# Patient Record
Sex: Female | Born: 1935
Health system: Southern US, Community
[De-identification: ages and names within clinical notes are randomized; demographics above are authoritative.]

## PROBLEM LIST (undated history)

## (undated) DIAGNOSIS — N312 Flaccid neuropathic bladder, not elsewhere classified: Secondary | ICD-10-CM

## (undated) DIAGNOSIS — I10 Essential (primary) hypertension: Secondary | ICD-10-CM

## (undated) DIAGNOSIS — C801 Malignant (primary) neoplasm, unspecified: Secondary | ICD-10-CM

## (undated) DIAGNOSIS — R002 Palpitations: Secondary | ICD-10-CM

## (undated) DIAGNOSIS — Z9889 Other specified postprocedural states: Secondary | ICD-10-CM

## (undated) DIAGNOSIS — M1990 Unspecified osteoarthritis, unspecified site: Secondary | ICD-10-CM

## (undated) DIAGNOSIS — Z860101 Personal history of adenomatous and serrated colon polyps: Secondary | ICD-10-CM

## (undated) DIAGNOSIS — Z8709 Personal history of other diseases of the respiratory system: Secondary | ICD-10-CM

## (undated) DIAGNOSIS — Z86010 Personal history of colonic polyps: Secondary | ICD-10-CM

## (undated) DIAGNOSIS — Z85828 Personal history of other malignant neoplasm of skin: Secondary | ICD-10-CM

## (undated) DIAGNOSIS — R0600 Dyspnea, unspecified: Secondary | ICD-10-CM

## (undated) DIAGNOSIS — IMO0001 Reserved for inherently not codable concepts without codable children: Secondary | ICD-10-CM

## (undated) DIAGNOSIS — Z960 Presence of urogenital implants: Secondary | ICD-10-CM

## (undated) DIAGNOSIS — Z86018 Personal history of other benign neoplasm: Secondary | ICD-10-CM

## (undated) DIAGNOSIS — L659 Nonscarring hair loss, unspecified: Secondary | ICD-10-CM

## (undated) DIAGNOSIS — E782 Mixed hyperlipidemia: Secondary | ICD-10-CM

## (undated) DIAGNOSIS — K644 Residual hemorrhoidal skin tags: Secondary | ICD-10-CM

## (undated) DIAGNOSIS — R339 Retention of urine, unspecified: Secondary | ICD-10-CM

## (undated) DIAGNOSIS — Z87442 Personal history of urinary calculi: Secondary | ICD-10-CM

## (undated) DIAGNOSIS — Z978 Presence of other specified devices: Secondary | ICD-10-CM

## (undated) DIAGNOSIS — Z853 Personal history of malignant neoplasm of breast: Secondary | ICD-10-CM

## (undated) HISTORY — DX: Flaccid neuropathic bladder, not elsewhere classified: N31.2

## (undated) HISTORY — DX: Reserved for inherently not codable concepts without codable children: IMO0001

## (undated) HISTORY — PX: APPENDECTOMY: SHX54

## (undated) HISTORY — PX: COLONOSCOPY: SHX174

## (undated) HISTORY — DX: Palpitations: R00.2

## (undated) HISTORY — DX: Residual hemorrhoidal skin tags: K64.4

## (undated) HISTORY — DX: Malignant (primary) neoplasm, unspecified: C80.1

## (undated) HISTORY — PX: TONSILLECTOMY: SHX5217

## (undated) HISTORY — DX: Mixed hyperlipidemia: E78.2

## (undated) HISTORY — PX: CATARACT EXTRACTION W/ INTRAOCULAR LENS  IMPLANT, BILATERAL: SHX1307

---

## 2005-07-20 ENCOUNTER — Ambulatory Visit (HOSPITAL_COMMUNITY): Admission: RE | Admit: 2005-07-20 | Discharge: 2005-07-20 | Payer: Self-pay | Admitting: Gastroenterology

## 2005-07-20 ENCOUNTER — Encounter (INDEPENDENT_AMBULATORY_CARE_PROVIDER_SITE_OTHER): Payer: Self-pay | Admitting: Specialist

## 2005-07-25 ENCOUNTER — Encounter: Admission: RE | Admit: 2005-07-25 | Discharge: 2005-07-25 | Payer: Self-pay | Admitting: Gastroenterology

## 2005-08-18 HISTORY — PX: OTHER SURGICAL HISTORY: SHX169

## 2005-12-29 ENCOUNTER — Encounter: Admission: RE | Admit: 2005-12-29 | Discharge: 2005-12-29 | Payer: Self-pay | Admitting: Family Medicine

## 2005-12-29 ENCOUNTER — Encounter (INDEPENDENT_AMBULATORY_CARE_PROVIDER_SITE_OTHER): Payer: Self-pay | Admitting: Diagnostic Radiology

## 2005-12-29 ENCOUNTER — Encounter (INDEPENDENT_AMBULATORY_CARE_PROVIDER_SITE_OTHER): Payer: Self-pay | Admitting: *Deleted

## 2006-01-09 ENCOUNTER — Encounter: Admission: RE | Admit: 2006-01-09 | Discharge: 2006-01-09 | Payer: Self-pay | Admitting: Surgery

## 2006-01-22 ENCOUNTER — Encounter: Admission: RE | Admit: 2006-01-22 | Discharge: 2006-01-22 | Payer: Self-pay | Admitting: General Surgery

## 2006-01-23 ENCOUNTER — Ambulatory Visit (HOSPITAL_BASED_OUTPATIENT_CLINIC_OR_DEPARTMENT_OTHER): Admission: RE | Admit: 2006-01-23 | Discharge: 2006-01-23 | Payer: Self-pay | Admitting: General Surgery

## 2006-01-23 ENCOUNTER — Encounter (INDEPENDENT_AMBULATORY_CARE_PROVIDER_SITE_OTHER): Payer: Self-pay | Admitting: Specialist

## 2006-01-23 HISTORY — PX: BREAST LUMPECTOMY WITH NEEDLE LOCALIZATION AND AXILLARY SENTINEL LYMPH NODE BX: SHX5760

## 2006-01-24 ENCOUNTER — Ambulatory Visit: Payer: Self-pay | Admitting: Oncology

## 2006-02-14 LAB — COMPREHENSIVE METABOLIC PANEL
ALT: 15 U/L (ref 0–35)
AST: 14 U/L (ref 0–37)
Alkaline Phosphatase: 77 U/L (ref 39–117)
Creatinine, Ser: 0.83 mg/dL (ref 0.40–1.20)
Total Bilirubin: 0.7 mg/dL (ref 0.3–1.2)

## 2006-02-14 LAB — CBC WITH DIFFERENTIAL/PLATELET
BASO%: 0.4 % (ref 0.0–2.0)
EOS%: 3.1 % (ref 0.0–7.0)
HCT: 37.9 % (ref 34.8–46.6)
LYMPH%: 25.5 % (ref 14.0–48.0)
MCH: 29.3 pg (ref 26.0–34.0)
MCHC: 34.3 g/dL (ref 32.0–36.0)
NEUT%: 61.5 % (ref 39.6–76.8)
Platelets: 247 10*3/uL (ref 145–400)

## 2006-02-19 ENCOUNTER — Ambulatory Visit (HOSPITAL_COMMUNITY): Admission: RE | Admit: 2006-02-19 | Discharge: 2006-02-19 | Payer: Self-pay | Admitting: Oncology

## 2006-02-28 ENCOUNTER — Ambulatory Visit (HOSPITAL_COMMUNITY): Admission: RE | Admit: 2006-02-28 | Discharge: 2006-03-01 | Payer: Self-pay | Admitting: General Surgery

## 2006-02-28 ENCOUNTER — Encounter (INDEPENDENT_AMBULATORY_CARE_PROVIDER_SITE_OTHER): Payer: Self-pay | Admitting: Specialist

## 2006-02-28 HISTORY — PX: MASTECTOMY: SHX3

## 2006-03-08 ENCOUNTER — Ambulatory Visit (HOSPITAL_COMMUNITY): Admission: RE | Admit: 2006-03-08 | Discharge: 2006-03-08 | Payer: Self-pay | Admitting: Oncology

## 2006-03-19 ENCOUNTER — Ambulatory Visit: Admission: RE | Admit: 2006-03-19 | Discharge: 2006-06-01 | Payer: Self-pay | Admitting: Radiation Oncology

## 2006-03-26 ENCOUNTER — Ambulatory Visit: Payer: Self-pay | Admitting: Oncology

## 2006-07-06 ENCOUNTER — Ambulatory Visit: Payer: Self-pay | Admitting: Oncology

## 2006-07-11 LAB — CBC WITH DIFFERENTIAL/PLATELET
BASO%: 0.5 % (ref 0.0–2.0)
Basophils Absolute: 0 10*3/uL (ref 0.0–0.1)
EOS%: 3.7 % (ref 0.0–7.0)
HGB: 13.3 g/dL (ref 11.6–15.9)
MCH: 28.9 pg (ref 26.0–34.0)
MONO#: 0.4 10*3/uL (ref 0.1–0.9)
RDW: 14.9 % — ABNORMAL HIGH (ref 11.3–14.5)
WBC: 6.3 10*3/uL (ref 3.9–10.0)
lymph#: 1.7 10*3/uL (ref 0.9–3.3)

## 2006-07-14 LAB — CANCER ANTIGEN 27.29: CA 27.29: 25 U/mL (ref 0–39)

## 2006-07-14 LAB — COMPREHENSIVE METABOLIC PANEL
ALT: 17 U/L (ref 0–35)
AST: 15 U/L (ref 0–37)
Albumin: 4.2 g/dL (ref 3.5–5.2)
BUN: 14 mg/dL (ref 6–23)
CO2: 30 mEq/L (ref 19–32)
Calcium: 9.3 mg/dL (ref 8.4–10.5)
Chloride: 105 mEq/L (ref 96–112)
Potassium: 4.2 mEq/L (ref 3.5–5.3)

## 2006-09-05 ENCOUNTER — Ambulatory Visit: Payer: Self-pay | Admitting: Oncology

## 2006-09-10 LAB — CBC WITH DIFFERENTIAL/PLATELET
BASO%: 0.6 % (ref 0.0–2.0)
EOS%: 3.9 % (ref 0.0–7.0)
HCT: 36.1 % (ref 34.8–46.6)
MCH: 29.5 pg (ref 26.0–34.0)
MCHC: 35 g/dL (ref 32.0–36.0)
MCV: 84.2 fL (ref 81.0–101.0)
MONO%: 9.9 % (ref 0.0–13.0)
NEUT%: 55.5 % (ref 39.6–76.8)
RDW: 15.8 % — ABNORMAL HIGH (ref 11.3–14.5)
lymph#: 1.8 10*3/uL (ref 0.9–3.3)

## 2006-09-10 LAB — COMPREHENSIVE METABOLIC PANEL
ALT: 15 U/L (ref 0–35)
AST: 15 U/L (ref 0–37)
Alkaline Phosphatase: 75 U/L (ref 39–117)
BUN: 15 mg/dL (ref 6–23)
Calcium: 9.6 mg/dL (ref 8.4–10.5)
Chloride: 104 mEq/L (ref 96–112)
Creatinine, Ser: 0.79 mg/dL (ref 0.40–1.20)
Total Bilirubin: 0.5 mg/dL (ref 0.3–1.2)

## 2007-04-22 ENCOUNTER — Encounter: Admission: RE | Admit: 2007-04-22 | Discharge: 2007-04-22 | Payer: Self-pay | Admitting: Orthopedic Surgery

## 2007-04-23 ENCOUNTER — Ambulatory Visit: Payer: Self-pay | Admitting: Oncology

## 2007-04-24 ENCOUNTER — Ambulatory Visit (HOSPITAL_BASED_OUTPATIENT_CLINIC_OR_DEPARTMENT_OTHER): Admission: RE | Admit: 2007-04-24 | Discharge: 2007-04-24 | Payer: Self-pay | Admitting: Orthopedic Surgery

## 2007-04-24 HISTORY — PX: CARPAL TUNNEL RELEASE: SHX101

## 2007-04-25 LAB — COMPREHENSIVE METABOLIC PANEL
ALT: 15 U/L (ref 0–35)
AST: 18 U/L (ref 0–37)
Albumin: 4.5 g/dL (ref 3.5–5.2)
Alkaline Phosphatase: 69 U/L (ref 39–117)
BUN: 24 mg/dL — ABNORMAL HIGH (ref 6–23)
Potassium: 4.1 mEq/L (ref 3.5–5.3)
Sodium: 143 mEq/L (ref 135–145)

## 2007-04-25 LAB — CBC WITH DIFFERENTIAL/PLATELET
BASO%: 0.2 % (ref 0.0–2.0)
Basophils Absolute: 0 10*3/uL (ref 0.0–0.1)
EOS%: 0.1 % (ref 0.0–7.0)
MCH: 29.6 pg (ref 26.0–34.0)
MCHC: 35.3 g/dL (ref 32.0–36.0)
MCV: 84 fL (ref 81.0–101.0)
MONO%: 8.3 % (ref 0.0–13.0)
RBC: 4.34 10*6/uL (ref 3.70–5.32)
RDW: 14.9 % — ABNORMAL HIGH (ref 11.3–14.5)

## 2007-04-25 LAB — CANCER ANTIGEN 27.29: CA 27.29: 19 U/mL (ref 0–39)

## 2007-07-22 ENCOUNTER — Other Ambulatory Visit: Admission: RE | Admit: 2007-07-22 | Discharge: 2007-07-22 | Payer: Self-pay | Admitting: Family Medicine

## 2007-08-07 ENCOUNTER — Ambulatory Visit (HOSPITAL_BASED_OUTPATIENT_CLINIC_OR_DEPARTMENT_OTHER): Admission: RE | Admit: 2007-08-07 | Discharge: 2007-08-07 | Payer: Self-pay | Admitting: Orthopedic Surgery

## 2007-08-07 HISTORY — PX: OTHER SURGICAL HISTORY: SHX169

## 2007-11-11 ENCOUNTER — Ambulatory Visit: Payer: Self-pay | Admitting: Oncology

## 2007-11-13 LAB — CBC WITH DIFFERENTIAL/PLATELET
Basophils Absolute: 0 10*3/uL (ref 0.0–0.1)
Eosinophils Absolute: 0.3 10*3/uL (ref 0.0–0.5)
HGB: 12.1 g/dL (ref 11.6–15.9)
MCV: 85.6 fL (ref 81.0–101.0)
MONO#: 0.5 10*3/uL (ref 0.1–0.9)
NEUT#: 3.7 10*3/uL (ref 1.5–6.5)
Platelets: 277 10*3/uL (ref 145–400)
RBC: 4.19 10*6/uL (ref 3.70–5.32)
RDW: 14.6 % — ABNORMAL HIGH (ref 11.3–14.5)
WBC: 6.1 10*3/uL (ref 3.9–10.0)

## 2007-11-14 LAB — CANCER ANTIGEN 27.29: CA 27.29: 26 U/mL (ref 0–39)

## 2007-11-14 LAB — COMPREHENSIVE METABOLIC PANEL
ALT: 14 U/L (ref 0–35)
Albumin: 4.1 g/dL (ref 3.5–5.2)
CO2: 25 mEq/L (ref 19–32)
Calcium: 9.8 mg/dL (ref 8.4–10.5)
Chloride: 104 mEq/L (ref 96–112)
Glucose, Bld: 100 mg/dL — ABNORMAL HIGH (ref 70–99)
Sodium: 139 mEq/L (ref 135–145)
Total Protein: 7 g/dL (ref 6.0–8.3)

## 2007-11-14 LAB — LACTATE DEHYDROGENASE: LDH: 164 U/L (ref 94–250)

## 2007-11-14 LAB — VITAMIN D 25 HYDROXY (VIT D DEFICIENCY, FRACTURES): Vit D, 25-Hydroxy: 32 ng/mL (ref 30–89)

## 2008-05-20 LAB — HM COLONOSCOPY: HM Colonoscopy: NORMAL

## 2008-05-22 ENCOUNTER — Ambulatory Visit: Payer: Self-pay | Admitting: Oncology

## 2008-05-26 LAB — CBC WITH DIFFERENTIAL/PLATELET
BASO%: 0.4 % (ref 0.0–2.0)
EOS%: 3.9 % (ref 0.0–7.0)
HCT: 38.4 % (ref 34.8–46.6)
LYMPH%: 29.3 % (ref 14.0–49.7)
MCH: 29.1 pg (ref 25.1–34.0)
MCHC: 33.6 g/dL (ref 31.5–36.0)
MCV: 86.7 fL (ref 79.5–101.0)
MONO%: 7.7 % (ref 0.0–14.0)
NEUT%: 58.7 % (ref 38.4–76.8)
Platelets: 215 10*3/uL (ref 145–400)
RBC: 4.43 10*6/uL (ref 3.70–5.45)
WBC: 6.1 10*3/uL (ref 3.9–10.3)

## 2008-05-26 LAB — COMPREHENSIVE METABOLIC PANEL
ALT: 16 U/L (ref 0–35)
AST: 19 U/L (ref 0–37)
BUN: 10 mg/dL (ref 6–23)
Calcium: 9.9 mg/dL (ref 8.4–10.5)
Chloride: 103 mEq/L (ref 96–112)
Creatinine, Ser: 0.75 mg/dL (ref 0.40–1.20)
Total Bilirubin: 0.9 mg/dL (ref 0.3–1.2)

## 2008-05-26 LAB — LACTATE DEHYDROGENASE: LDH: 167 U/L (ref 94–250)

## 2008-12-11 ENCOUNTER — Emergency Department (HOSPITAL_COMMUNITY): Admission: EM | Admit: 2008-12-11 | Discharge: 2008-12-11 | Payer: Self-pay | Admitting: Emergency Medicine

## 2008-12-23 ENCOUNTER — Ambulatory Visit: Payer: Self-pay | Admitting: Oncology

## 2008-12-25 LAB — CBC WITH DIFFERENTIAL/PLATELET
BASO%: 0.6 % (ref 0.0–2.0)
HCT: 36.9 % (ref 34.8–46.6)
HGB: 12.5 g/dL (ref 11.6–15.9)
MCHC: 33.8 g/dL (ref 31.5–36.0)
MONO#: 0.4 10*3/uL (ref 0.1–0.9)
NEUT%: 60.2 % (ref 38.4–76.8)
RDW: 13.9 % (ref 11.2–14.5)
WBC: 6.3 10*3/uL (ref 3.9–10.3)
lymph#: 1.8 10*3/uL (ref 0.9–3.3)

## 2008-12-26 LAB — COMPREHENSIVE METABOLIC PANEL
ALT: 14 U/L (ref 0–35)
AST: 16 U/L (ref 0–37)
Albumin: 4.3 g/dL (ref 3.5–5.2)
CO2: 26 mEq/L (ref 19–32)
Calcium: 9.7 mg/dL (ref 8.4–10.5)
Chloride: 106 mEq/L (ref 96–112)
Creatinine, Ser: 0.78 mg/dL (ref 0.40–1.20)
Potassium: 3.9 mEq/L (ref 3.5–5.3)
Total Protein: 7 g/dL (ref 6.0–8.3)

## 2008-12-26 LAB — LACTATE DEHYDROGENASE: LDH: 187 U/L (ref 94–250)

## 2008-12-28 LAB — HM MAMMOGRAPHY

## 2009-01-31 ENCOUNTER — Emergency Department (HOSPITAL_COMMUNITY): Admission: EM | Admit: 2009-01-31 | Discharge: 2009-01-31 | Payer: Self-pay | Admitting: Emergency Medicine

## 2009-02-24 ENCOUNTER — Encounter: Admission: RE | Admit: 2009-02-24 | Discharge: 2009-02-24 | Payer: Self-pay | Admitting: Otolaryngology

## 2009-06-23 ENCOUNTER — Ambulatory Visit: Payer: Self-pay | Admitting: Oncology

## 2009-06-25 LAB — COMPREHENSIVE METABOLIC PANEL
AST: 14 U/L (ref 0–37)
Alkaline Phosphatase: 85 U/L (ref 39–117)
BUN: 12 mg/dL (ref 6–23)
Calcium: 9.2 mg/dL (ref 8.4–10.5)
Creatinine, Ser: 0.83 mg/dL (ref 0.40–1.20)

## 2009-06-25 LAB — CBC WITH DIFFERENTIAL/PLATELET
Basophils Absolute: 0.1 10*3/uL (ref 0.0–0.1)
EOS%: 2 % (ref 0.0–7.0)
HCT: 37.9 % (ref 34.8–46.6)
HGB: 12.9 g/dL (ref 11.6–15.9)
MCH: 29.4 pg (ref 25.1–34.0)
MCV: 86.4 fL (ref 79.5–101.0)
MONO%: 9.4 % (ref 0.0–14.0)
NEUT%: 73.3 % (ref 38.4–76.8)

## 2009-09-07 ENCOUNTER — Ambulatory Visit: Payer: Self-pay | Admitting: Oncology

## 2010-04-27 ENCOUNTER — Ambulatory Visit (INDEPENDENT_AMBULATORY_CARE_PROVIDER_SITE_OTHER): Payer: Medicare Other | Admitting: Physician Assistant

## 2010-04-27 DIAGNOSIS — R000 Tachycardia, unspecified: Secondary | ICD-10-CM

## 2010-04-27 DIAGNOSIS — E7800 Pure hypercholesterolemia, unspecified: Secondary | ICD-10-CM

## 2010-06-22 LAB — URINE MICROSCOPIC-ADD ON

## 2010-06-22 LAB — URINALYSIS, ROUTINE W REFLEX MICROSCOPIC
Glucose, UA: NEGATIVE mg/dL
Leukocytes, UA: NEGATIVE
Nitrite: NEGATIVE
Protein, ur: 100 mg/dL — AB

## 2010-08-02 NOTE — Op Note (Signed)
Mary [name], Mary [name]                 ACCOUNT NO.:  [id number]   MEDICAL RECORD NO.:  [id number]          PATIENT TYPE:  AMB   LOCATION:  DSC                          FACILITY:  [facility]   PHYSICIAN:  Artist Pais. Weingold, M.D.DATE OF BIRTH:  [birth year]   DATE OF PROCEDURE:  08/07/2007  DATE OF DISCHARGE:                               OPERATIVE REPORT   PREOPERATIVE DIAGNOSIS:  Chronic left index finger and left small finger  stenosing tenosynovitis.   POSTOPERATIVE DIAGNOSIS:  Chronic left index finger and left small  finger stenosing tenosynovitis.   PROCEDURE:  Release A1 pulleys, left index and left small.   SURGEON:  Artist Pais. Mina Marble, MD   ASSISTANT:  None.   ANESTHESIA:  General.   TOURNIQUET TIME:  12 minutes.   No complications or drains.   OPERATIVE REPORT:  The patient was taken to operating suite.  After  induction of adequate general anesthesia, the left upper thigh was  prepped and draped in sterile fashion.  An Esmarch was used to  exsanguinate the limb.  Tourniquet was then inflated to 250 mmHg at this  point in time.  Transverse incisions were made in the A1 pulley areas of  the index and small finger, left hand.  The skin was incised.  Neurovascular bundles was identified and retracted.  A1 pulley was  split.  FDS and FDP tendons were lysed of all adhesions.  The wounds  were irrigated and these were closed with 4-0 nylon with two horizontal  mattress sutures.  Xeroform, 4x4s, and compressive wrap was applied.  The patient tolerated the procedure well, went to recovery room in  stable fashion.      Artist Pais Mina Marble, M.D.  Electronically Signed     MAW/MEDQ  D:  08/07/2007  T:  08/07/2007  Job:  [id number]

## 2010-08-02 NOTE — Op Note (Signed)
NAMECHAVONNE, SFORZA                 ACCOUNT NO.:  [id number]   MEDICAL RECORD NO.:  [id number]          PATIENT TYPE:  AMB   LOCATION:  DSC                          FACILITY:  [facility]   PHYSICIAN:  Artist Pais. Weingold, M.D.DATE OF BIRTH:  08/29/1926   DATE OF PROCEDURE:  04/24/2007  DATE OF DISCHARGE:                               OPERATIVE REPORT   PREOPERATIVE DIAGNOSES:  1. Chronic right carpal tunnel syndrome.  2. Chronic right index and small finger stenosing tenosynovitis.   POSTOPERATIVE DIAGNOSES:  1. Chronic right carpal tunnel syndrome.  2. Chronic right index and small finger stenosing tenosynovitis.   OPERATION PERFORMED:  1. Right carpal tunnel release.  2. Right index finger A-1 pulley release.  3. Right small finger A-1 pulley release through three separate      incisions.   SURGEON:  Artist Pais. Mina Marble, M.D.   ASSISTANT:  None.   ANESTHESIA:  General.   TOURNIQUET TIME:  Twenty-six minutes.   COMPLICATIONS:  There were no complications.   DRAINS:  No drains.   DESCRIPTION OF THE OPERATION:  The patient brought to the operating  suite.  After the induction of adequate general anesthesia the right  upper extremity was prepped and draped in the usual sterile fashion.  An  Esmarch was used to exsanguinate the limb.  The tourniquet was then  inflated to 275 mmHg.  At this point in time an incision was made in the  palmar aspect of right hand in line with long finger metacarpal starting  at Kaplan's cardinal line.  The skin was incised 2 cm.  The palmar  fascia was identified and split.  The distal edge of the transverse  carpal ligament was identified and split with a 15 blade.  The median  nerve was identified and protected with Therapist, nutritional.  The remaining  aspects of the transverse carpal ligament were divided under direct  vision using curved blunt scissors.  The canal was inspected.  There  were no osseous lesions or ganglions present.  It was irrigated  and  loosely closed with a 3-0 Prolene subcuticular stitch.   A second incision was made at the A-1 pulley of the index finger  transversely.  The skin was incised.  The neurovascular was identified  and retracted.  The A-1 pulley was split.  The FDS and FDP tendons were  lysed of all adhesions.  The wound was irrigated and loosely closed with  a 3-0 Prolene subcuticular stitch.   A third incision was made at the A-1 pulley area of the small finger,  right hand.  The skin was incised transversely.  The neurovascular  bundle was identified and retracted.  The A-1 pulley was split.  The FDS  and FDP tendons were lysed of all adhesions.  The wound  was irrigated  and loosely closed with a 3-0 Prolene subcuticular stitch.  Steri-  Strips, four-by-fours,  fluffs and a compressive dressing were applied.   The patient tolerated all three procedures well and went to recovery in  a stable fashion.      Artist Pais  Mina Marble, M.D.  Electronically Signed     MAW/MEDQ  D:  04/24/2007  T:  04/25/2007  Job:  [id number]

## 2010-08-09 ENCOUNTER — Encounter (INDEPENDENT_AMBULATORY_CARE_PROVIDER_SITE_OTHER): Payer: Self-pay | Admitting: Surgery

## 2010-09-02 ENCOUNTER — Other Ambulatory Visit: Payer: Self-pay | Admitting: Oncology

## 2010-09-02 ENCOUNTER — Encounter (HOSPITAL_BASED_OUTPATIENT_CLINIC_OR_DEPARTMENT_OTHER): Payer: Medicare Other | Admitting: Oncology

## 2010-09-02 DIAGNOSIS — M810 Age-related osteoporosis without current pathological fracture: Secondary | ICD-10-CM

## 2010-09-02 DIAGNOSIS — C50419 Malignant neoplasm of upper-outer quadrant of unspecified female breast: Secondary | ICD-10-CM

## 2010-09-02 DIAGNOSIS — Z170 Estrogen receptor positive status [ER+]: Secondary | ICD-10-CM

## 2010-09-02 LAB — COMPREHENSIVE METABOLIC PANEL
ALT: 24 U/L (ref 0–35)
BUN: 15 mg/dL (ref 6–23)
CO2: 29 mEq/L (ref 19–32)
Calcium: 9.7 mg/dL (ref 8.4–10.5)
Chloride: 102 mEq/L (ref 96–112)
Creatinine, Ser: 0.86 mg/dL (ref 0.50–1.10)

## 2010-09-02 LAB — CBC WITH DIFFERENTIAL/PLATELET
BASO%: 0.4 % (ref 0.0–2.0)
Eosinophils Absolute: 0.3 10*3/uL (ref 0.0–0.5)
HCT: 38.5 % (ref 34.8–46.6)
LYMPH%: 26.2 % (ref 14.0–49.7)
MONO#: 0.4 10*3/uL (ref 0.1–0.9)
NEUT#: 4.2 10*3/uL (ref 1.5–6.5)
NEUT%: 63.4 % (ref 38.4–76.8)
Platelets: 254 10*3/uL (ref 145–400)
WBC: 6.7 10*3/uL (ref 3.9–10.3)
lymph#: 1.8 10*3/uL (ref 0.9–3.3)

## 2010-09-02 LAB — LACTATE DEHYDROGENASE: LDH: 264 U/L — ABNORMAL HIGH (ref 94–250)

## 2010-09-03 ENCOUNTER — Emergency Department (HOSPITAL_COMMUNITY)
Admission: EM | Admit: 2010-09-03 | Discharge: 2010-09-03 | Disposition: A | Discharge status: Home / Self Care | Payer: Medicare Other | Attending: Emergency Medicine | Admitting: Emergency Medicine

## 2010-09-03 DIAGNOSIS — Z79899 Other long term (current) drug therapy: Secondary | ICD-10-CM | POA: Insufficient documentation

## 2010-09-03 DIAGNOSIS — R339 Retention of urine, unspecified: Secondary | ICD-10-CM | POA: Insufficient documentation

## 2010-09-03 DIAGNOSIS — R109 Unspecified abdominal pain: Secondary | ICD-10-CM | POA: Insufficient documentation

## 2010-09-03 DIAGNOSIS — R1909 Other intra-abdominal and pelvic swelling, mass and lump: Secondary | ICD-10-CM | POA: Insufficient documentation

## 2010-09-03 LAB — URINALYSIS, ROUTINE W REFLEX MICROSCOPIC
Glucose, UA: NEGATIVE mg/dL
Specific Gravity, Urine: 1.007 (ref 1.005–1.030)
pH: 7.5 (ref 5.0–8.0)

## 2010-09-03 LAB — URINE MICROSCOPIC-ADD ON

## 2010-09-09 ENCOUNTER — Encounter (HOSPITAL_BASED_OUTPATIENT_CLINIC_OR_DEPARTMENT_OTHER): Payer: Medicare Other | Admitting: Oncology

## 2010-09-09 DIAGNOSIS — C50419 Malignant neoplasm of upper-outer quadrant of unspecified female breast: Secondary | ICD-10-CM

## 2010-09-09 DIAGNOSIS — M810 Age-related osteoporosis without current pathological fracture: Secondary | ICD-10-CM

## 2010-09-09 DIAGNOSIS — Z170 Estrogen receptor positive status [ER+]: Secondary | ICD-10-CM

## 2010-10-04 ENCOUNTER — Other Ambulatory Visit: Payer: Self-pay | Admitting: Family Medicine

## 2010-10-05 ENCOUNTER — Other Ambulatory Visit: Payer: Self-pay | Admitting: *Deleted

## 2010-10-05 DIAGNOSIS — E785 Hyperlipidemia, unspecified: Secondary | ICD-10-CM

## 2010-10-12 ENCOUNTER — Encounter: Payer: Self-pay | Admitting: Family Medicine

## 2010-10-17 ENCOUNTER — Other Ambulatory Visit: Payer: Medicare Other

## 2010-10-17 DIAGNOSIS — E785 Hyperlipidemia, unspecified: Secondary | ICD-10-CM

## 2010-10-17 LAB — COMPREHENSIVE METABOLIC PANEL
ALT: 18 U/L (ref 0–35)
Albumin: 4.3 g/dL (ref 3.5–5.2)
BUN: 12 mg/dL (ref 6–23)
CO2: 27 mEq/L (ref 19–32)
Calcium: 9.9 mg/dL (ref 8.4–10.5)
Chloride: 104 mEq/L (ref 96–112)
Creat: 0.77 mg/dL (ref 0.50–1.10)
Potassium: 4.2 mEq/L (ref 3.5–5.3)

## 2010-10-17 LAB — LIPID PANEL
Cholesterol: 205 mg/dL — ABNORMAL HIGH (ref 0–200)
Triglycerides: 173 mg/dL — ABNORMAL HIGH (ref <150)

## 2010-10-19 ENCOUNTER — Encounter: Payer: Self-pay | Admitting: Family Medicine

## 2010-10-19 ENCOUNTER — Ambulatory Visit (INDEPENDENT_AMBULATORY_CARE_PROVIDER_SITE_OTHER): Payer: Medicare Other | Admitting: Family Medicine

## 2010-10-19 DIAGNOSIS — E782 Mixed hyperlipidemia: Secondary | ICD-10-CM | POA: Insufficient documentation

## 2010-10-19 DIAGNOSIS — M25569 Pain in unspecified knee: Secondary | ICD-10-CM

## 2010-10-19 DIAGNOSIS — N312 Flaccid neuropathic bladder, not elsewhere classified: Secondary | ICD-10-CM

## 2010-10-19 DIAGNOSIS — R002 Palpitations: Secondary | ICD-10-CM | POA: Insufficient documentation

## 2010-10-19 DIAGNOSIS — M25559 Pain in unspecified hip: Secondary | ICD-10-CM

## 2010-10-19 DIAGNOSIS — M25552 Pain in left hip: Secondary | ICD-10-CM

## 2010-10-19 DIAGNOSIS — M25561 Pain in right knee: Secondary | ICD-10-CM

## 2010-10-19 MED ORDER — ATORVASTATIN CALCIUM 40 MG PO TABS: 40.0000 mg | ORAL_TABLET | Freq: Every day | ORAL | Status: DC

## 2010-10-19 MED ORDER — VERAPAMIL HCL 240 MG PO TBCR: EXTENDED_RELEASE_TABLET | ORAL | Status: DC

## 2010-10-19 MED ORDER — LANOXIN 125 MCG PO TABS: 125.0000 ug | ORAL_TABLET | Freq: Every day | ORAL | Status: DC

## 2010-10-19 NOTE — Progress Notes (Signed)
Patient presents for f/u recent lipids. She follows a low cholesterol diet.  Can't tolerate taking fish oil (trouble swallowing the capsules, doesn't like taste). She has no complaints, except for some recurrence of L hip and R knee pain.  She  Saw Dr. Despina Hick in November for pain L hip and R knee.  Went back and had cortisone injections in R knee and L hip (doesn't recall if in joint or bursa) in March 2012.  The injections helped a lot, but pain has recurred.  Using Aleve with good results, as does Advil.  Doesn't use a medication daily.  She doesn't check BP's elsewhere.  BP at Dr. Patsi Sears yesterday was very high, better on repeat.  She denies a h/o HTN, having been placed on CCB for palpitations years ago.  She had an episode of urinary retention.  Was started on low dose urecholine, and seems to be doing well.  Saw Dr. Patsi Sears for f/u yesterday.  She tells me that she stopped the Fosamax 3 months ago.  She had been on it for many years.  She is scheduled for a DEXA in October through Dr. Donnie Coffin.  Past Medical History  Diagnosis Date  . Heart palpitations   . Breast cancer     right  . Impaired fasting glucose   . Mixed hyperlipidemia    Past Surgical History  Procedure Date  . Mastectomy     RIGHT BREAST  . Colon surgery 2007   Current outpatient prescriptions:atorvastatin (LIPITOR) 40 MG tablet, Take 1 tablet (40 mg total) by mouth daily., Disp: 90 tablet, Rfl: 1;  bethanechol (URECHOLINE) 25 MG tablet, Take 25 mg by mouth 2 (two) times daily.  , Disp: , Rfl: ;  Calcium Carbonate-Vitamin D (CALCIUM 600+D) 600-400 MG-UNIT per tablet, Take 2 tablets by mouth daily.  , Disp: , Rfl:  LANOXIN 0.125 MG tablet, Take 1 tablet (125 mcg total) by mouth daily., Disp: 90 tablet, Rfl: 1;  letrozole (FEMARA) 2.5 MG tablet, Take 2.5 mg by mouth daily.  , Disp: , Rfl: ;  Multiple Vitamins-Minerals (CENTRUM PO), Take by mouth.  , Disp: , Rfl: ;  verapamil (CALAN SR) 240 MG CR tablet, 1/2 tablet once  daily, Disp: 90 tablet, Rfl: 1  ROS:  Denies headaches, dizziness, chest pain, palpitations, fevers, URI symptoms, abdominal pain or other GI complaints.  +L hip and R knee pain.  Denies skin concerns or other problems  PHYSICAL EXAM: BP 140/88  Pulse 76  Ht 5\' 3"  (1.6 m)  Wt 127 lb (57.607 kg)  BMI 22.50 kg/m2 Well developed, pleasant, elderly female, appearing younger than stated age Neck: no lymphadenopathy or thyromegaly Heart: regular rate and rhythm. No murmurs, ectopy Lungs: clear bilaterally Abdomen: soft, nontender, no organomegaly or masses Extremities: no edema Skin: no rash Psych: normal mood, affect, hygiene, grooming Neuro: alert and oriented x 3; normal strength, gait  Recent lipids: Cholesterol 205 (H); Triglycerides 173 (H);  HDL 62; Total CHOL/HDL Ratio 3.3;  VLDL 35;  LDL Cholesterol 108 (H) Normal c-met except for glucose 102   ASSESSMENT/PLAN: 1. Mixed hyperlipidemia  atorvastatin (LIPITOR) 40 MG tablet   Borderline TG--continue Lipitor and lowfat diet, re-check 6 months  2. Palpitations  verapamil (CALAN SR) 240 MG CR tablet, LANOXIN 0.125 MG tablet   controlled with Calan.  She has been cutting the CR tablet in 1/2--will check with pharm.  BP's now elevated--need to confirm; consider higher dose of CCB  3. Atony of bladder  Doing well on urecholine.  Followed by Dr. Patsi Sears  4. Hip pain, left    5. Knee pain, right    mildly impaired fasting glucose.  Encouraged daily exercise, limit sweets, keep weight controlled.   Elevated BP--no h/o HTN.  Possible white coat component.  Advised to check BP elsewhere.  If BP's consistently elevated, may need to increase Calan dose to 240mg .  In interim--to check with pharmacy regarding her splitting of the 240mg  CR tablet.   May need to change to 120 if recommended by pharmacist  Joint pains--recommend use of tylenol.  Risks of NSAIDs reviewed, may use occasionally.  Trial of glucosamine and chondroitin.  Follow up  with Dr. Lequita Halt if worsening pain.

## 2010-10-19 NOTE — Patient Instructions (Addendum)
Use Tylenol for pain. If this is ineffective, you may use Advil OR Aleve (not both)--try not to use these daily as they can cause ulcers and effect your kidneys.  If you need it daily, then we need to monitor you regularly, and likely recommend a medication like Zantac or Prilosec to protect your stomach from ulcers.  I recommend using Tylenol first--the "Arthritis" form last longer than regular tylenol.  I also recommend that you try a product containing both glucosamine and chondroitin--these are available at the pharmacy as a supplement, which if taken every day, can help with arthritis pain (may take about a month for you to see the difference).  If you are having ongoing problems with knee and hip pain, folllow up with Dr. Lequita Halt  Continue to follow low fat, low cholesterol diet.  Try and exercise daily--this helps your sugars and keep the HDL cholesterol high.  Please try and check your blood pressure periodically (at pharmacy).  Write the blood pressure down and bring your list of BP to all of your visits with me. Please mail/fax or call with your blood pressure values in 4-6 weeks.  If your blood pressure is consistently running >140/90, please schedule appointment. Try and follow a diet low in sodium (<2000 mg daily)

## 2010-11-03 ENCOUNTER — Telehealth: Payer: Self-pay | Admitting: Family Medicine

## 2010-11-03 DIAGNOSIS — R002 Palpitations: Secondary | ICD-10-CM

## 2010-11-03 MED ORDER — VERAPAMIL HCL 240 MG PO TBCR
EXTENDED_RELEASE_TABLET | ORAL | Status: DC
Start: 2010-11-03 — End: 2011-05-04

## 2010-11-03 NOTE — Telephone Encounter (Signed)
Spoke with pt.  Pulse has been in 70's to 80's.  Instructed her to increase to full tablet of Calan.  She is to continue to monitor BP, and also to write down pulse.  Advised of potential side effects (increased constipation, edema, BP too low, pulse too low).  To call if not tolerating this change--consider changing to 180mg  instead.  Patient to call with values in approximately 2 weeks, sooner if not feeling well

## 2010-11-03 NOTE — Telephone Encounter (Signed)
Called patient back and she stated that she had some high blood pressure readings as follows: 10/21/10   148/86     143/80 10/25/10   153/80                149/92  10/27/10   140 81                148/85 11/02/10   159/93     142/97 11/03/10   168/80     164/84  Please instruct on what you would like her to do. Thanks.

## 2010-11-20 ENCOUNTER — Encounter: Payer: Self-pay | Admitting: Family Medicine

## 2010-11-23 ENCOUNTER — Encounter: Payer: Self-pay | Admitting: *Deleted

## 2010-12-01 ENCOUNTER — Encounter: Payer: Self-pay | Admitting: Family Medicine

## 2010-12-01 ENCOUNTER — Ambulatory Visit (INDEPENDENT_AMBULATORY_CARE_PROVIDER_SITE_OTHER): Payer: Medicare Other | Admitting: Family Medicine

## 2010-12-01 VITALS — BP 124/70 | HR 72 | Ht 63.0 in | Wt 126.0 lb

## 2010-12-01 DIAGNOSIS — Z23 Encounter for immunization: Secondary | ICD-10-CM

## 2010-12-01 DIAGNOSIS — I10 Essential (primary) hypertension: Secondary | ICD-10-CM | POA: Insufficient documentation

## 2010-12-01 DIAGNOSIS — L659 Nonscarring hair loss, unspecified: Secondary | ICD-10-CM

## 2010-12-01 LAB — TSH: TSH: 1.7 u[IU]/mL (ref 0.350–4.500)

## 2010-12-01 NOTE — Patient Instructions (Signed)
Keep up the good work

## 2010-12-01 NOTE — Progress Notes (Signed)
Addended by: Debbrah Alar F on: 12/01/2010 05:42 PM   Modules accepted: Orders

## 2010-12-01 NOTE — Progress Notes (Signed)
Patient presents for f/u on HTN.  BP at [facility] had been running high, 140-160 but she isn't sure if it is accurate.  Machine wasn't working the last time she was there.  Sometimes she is rushed.  She states pulse originally was 90, but went down to 70-80 since increasing the Calan.  Denies any side effects since increasing the dose.  Seeing Dr. Lequita Halt for her R knee and left hip.  Has upcoming appointment, and is hoping for another cortisone s.hot  Dr. Patsi Sears rx'd Bethanechol 25mg  BID for her bladder, and it seems to be the only thing that has helped with her urinary symptoms.  It was started the end of July.  She is now complaining of hair loss, mainly at the front of her head where she notices it.  Noticing hair loss over the last 3-4 months.  Her hairdresser said she should have her thyroid checked.  Review of paper chart doesn't show a previous TSH done.  Past Medical History  Diagnosis Date  . Heart palpitations   . Breast cancer 10/07    right; invasive ductal CA; Dr. Donnie Coffin  . Impaired fasting glucose   . Mixed hyperlipidemia   . Constipation   . External hemorrhoid   . Adenomatous colon polyp 3/03  . Colonic mass 2007    high grade dysplasia (s/p L hemicolectomy)  . BCC (basal cell carcinoma), face 8/06    L nasolabial fold  . CTS (carpal tunnel syndrome)     right  . Osteoporosis     DEXA 01/2009; T-3.2 L fem neck, -2.4 L radius    Past Surgical History  Procedure Date  . Mastectomy 12/07    RIGHT BREAST  . Colon surgery 77    high grade dysplasia; neg LN; lap-assisted surgery  . Appendectomy age 77  . Tonsillectomy age 77  . Cataract extraction, bilateral R '75; L '75  . Carpal tunnel release 75    Right; Dr. Mina Marble  . Trigger finger release R 2/09; L 5/09    Dr. Mina Marble  . Colonoscopy 05/20/2008    repeat due 05/2011    History   Social History  . Marital Status: Widowed    Spouse Name: N/A    Number of Children: 3  . Years of Education: N/A    Occupational History  . Not on file.   Social History Main Topics  . Smoking status: Never Smoker   . Smokeless tobacco: Never Used  . Alcohol Use: Yes     very seldom  . Drug Use: No  . Sexually Active: Not on file   Other Topics Concern  . Not on file   Social History Narrative   Children live in [place] and [place]. She has 1 son and 2 daughters; brother and nephew live in [place]. Widowed '94    Family History  Problem Relation Age of Onset  . Hypertension Mother   . Stroke Father   . Hyperlipidemia Brother   . Breast cancer Maternal Aunt   . Breast cancer Paternal Aunt   . Breast cancer Cousin   . Colon cancer Cousin   . Breast cancer Cousin     Current outpatient prescriptions:atorvastatin (LIPITOR) 40 MG tablet, Take 1 tablet (40 mg total) by mouth daily., Disp: 90 tablet, Rfl: 1;  bethanechol (URECHOLINE) 25 MG tablet, Take 25 mg by mouth 2 (two) times daily.  , Disp: , Rfl: ;  Calcium Carbonate-Vitamin D (CALCIUM 600+D) 600-400 MG-UNIT per tablet, Take 2  tablets by mouth daily.  , Disp: , Rfl:  LANOXIN 0.125 MG tablet, Take 1 tablet (125 mcg total) by mouth daily., Disp: 90 tablet, Rfl: 1;  letrozole (FEMARA) 2.5 MG tablet, Take 2.5 mg by mouth daily.  , Disp: , Rfl: ;  Multiple Vitamins-Minerals (CENTRUM PO), Take by mouth.  , Disp: , Rfl: ;  verapamil (CALAN SR) 240 MG CR tablet, 1 tablet by mouth daily, Disp: 90 tablet, Rfl: 0  Allergies  Allergen Reactions  . Avelox (Moxifloxacin Hcl In Nacl) Nausea Only  . Biaxin Itching and Nausea Only  . Arimidex (Anastrozole) Rash  . Bactrim Nausea Only and Rash  . Penicillins Nausea Only and Rash  . Sulfa Drugs Cross Reactors Nausea And Vomiting and Rash   ROS: Denies fatigue, weight changes, temperature intolerance, skin changes or other thyroid symptoms.  Moods are normal. Recalls having to take thyroid medication when she was very young (teens). Denies fever, URI, cough, SOB, or other concerns  PHYSICAL EXAM: BP 124/70   Pulse 72  Ht 5\' 3"  (1.6 m)  Wt 126 lb (57.153 kg)  BMI 22.32 kg/m2 Heart: regular rate and rhythm without murmur Lungs: clear bilaterally Neck: no lymphadenopathy or thyromegaly Scalp: thinning, especially anteriorly, but throughout Skin: no rash  ASSESSMENT/PLAN: 1. Essential hypertension, benign     well controlled on higher dose of Calan  2. Hair loss  TSH   F/u 6 months, sooner prn. If thyroid normal, consider eval by dermatologist.  I looked up bethanechol and do not see hair loss listed as a side effect.  All other meds she has been on for many years

## 2010-12-02 NOTE — Progress Notes (Signed)
Advised pt of labs and derm .

## 2010-12-08 ENCOUNTER — Other Ambulatory Visit: Payer: Self-pay | Admitting: Dermatology

## 2010-12-09 LAB — BASIC METABOLIC PANEL
BUN: 15
CO2: 28
Calcium: 9.7
Chloride: 107
Creatinine, Ser: 0.74
GFR calc Af Amer: >60
GFR calc non Af Amer: >60
Glucose, Bld: 97
Potassium: 4
Sodium: 142

## 2010-12-14 LAB — BASIC METABOLIC PANEL
BUN: 13
CO2: 29
Calcium: 9.9
Chloride: 106
Creatinine, Ser: 0.8
GFR calc Af Amer: >60
GFR calc non Af Amer: >60
Glucose, Bld: 109 — ABNORMAL HIGH
Potassium: 3.7
Sodium: 141

## 2011-01-04 LAB — HM MAMMOGRAPHY: HM Mammogram: NEGATIVE

## 2011-01-11 ENCOUNTER — Encounter (INDEPENDENT_AMBULATORY_CARE_PROVIDER_SITE_OTHER): Payer: Self-pay | Admitting: Surgery

## 2011-03-03 ENCOUNTER — Other Ambulatory Visit: Payer: Self-pay | Admitting: Oncology

## 2011-03-03 ENCOUNTER — Other Ambulatory Visit (HOSPITAL_BASED_OUTPATIENT_CLINIC_OR_DEPARTMENT_OTHER): Payer: Medicare Other | Admitting: Lab

## 2011-03-03 DIAGNOSIS — Z170 Estrogen receptor positive status [ER+]: Secondary | ICD-10-CM

## 2011-03-03 DIAGNOSIS — C50919 Malignant neoplasm of unspecified site of unspecified female breast: Secondary | ICD-10-CM

## 2011-03-03 LAB — CBC WITH DIFFERENTIAL/PLATELET
Basophils Absolute: 0 10*3/uL (ref 0.0–0.1)
Eosinophils Absolute: 0.2 10*3/uL (ref 0.0–0.5)
HCT: 39 % (ref 34.8–46.6)
HGB: 12.8 g/dL (ref 11.6–15.9)
LYMPH%: 24 % (ref 14.0–49.7)
MCH: 28.5 pg (ref 25.1–34.0)
MCV: 86.9 fL (ref 79.5–101.0)
MONO%: 9.8 % (ref 0.0–14.0)
NEUT#: 4.5 10*3/uL (ref 1.5–6.5)
NEUT%: 62.8 % (ref 38.4–76.8)
Platelets: 245 10*3/uL (ref 145–400)
RDW: 14.7 % — ABNORMAL HIGH (ref 11.2–14.5)

## 2011-03-04 LAB — COMPREHENSIVE METABOLIC PANEL
Albumin: 4.4 g/dL (ref 3.5–5.2)
Alkaline Phosphatase: 70 U/L (ref 39–117)
BUN: 13 mg/dL (ref 6–23)
Creatinine, Ser: 0.98 mg/dL (ref 0.50–1.10)
Glucose, Bld: 105 mg/dL — ABNORMAL HIGH (ref 70–99)
Potassium: 4.2 mEq/L (ref 3.5–5.3)

## 2011-03-10 ENCOUNTER — Ambulatory Visit (HOSPITAL_BASED_OUTPATIENT_CLINIC_OR_DEPARTMENT_OTHER): Payer: Medicare Other | Admitting: Oncology

## 2011-03-10 ENCOUNTER — Telehealth: Payer: Self-pay | Admitting: Oncology

## 2011-03-10 VITALS — BP 183/94 | HR 84 | Temp 98.4°F | Ht 63.0 in | Wt 128.6 lb

## 2011-03-10 DIAGNOSIS — E559 Vitamin D deficiency, unspecified: Secondary | ICD-10-CM

## 2011-03-10 DIAGNOSIS — Z170 Estrogen receptor positive status [ER+]: Secondary | ICD-10-CM

## 2011-03-10 DIAGNOSIS — M810 Age-related osteoporosis without current pathological fracture: Secondary | ICD-10-CM

## 2011-03-10 DIAGNOSIS — C50919 Malignant neoplasm of unspecified site of unspecified female breast: Secondary | ICD-10-CM

## 2011-03-10 NOTE — Telephone Encounter (Signed)
Gv pt appt for dec2013

## 2011-03-10 NOTE — Progress Notes (Signed)
Hematology and Oncology Follow Up Visit  Mary [name] [id number] 1926/04/15 77 y.o. 03/10/2011 1:27 PM PCP eve knapp  Principle Diagnosis: T2N0 breast cancer, er+ s/p mrm onarimidex , now femara  Interim History:  There have been no intercurrent illness, hospitalizations or medication changes.  Medications: I have reviewed the patient's current medications.  Allergies:  Allergies  Allergen Reactions  . Avelox (Moxifloxacin Hcl In Nacl) Nausea Only  . Biaxin Itching and Nausea Only  . Arimidex (Anastrozole) Rash  . Bactrim Nausea Only and Rash  . Penicillins Nausea Only and Rash  . Sulfa Drugs Cross Reactors Nausea And Vomiting and Rash    Past Medical History, Surgical history, Social history, and Family History were reviewed and updated.  Review of Systems: Constitutional:  Negative for fever, chills, night sweats, anorexia, weight loss, pain. Cardiovascular: no chest pain or dyspnea on exertion Respiratory: no cough, shortness of breath, or wheezing Neurological: negative Dermatological: negative ENT: negative Skin Gastrointestinal: no abdominal pain, change in bowel habits, or black or bloody stools Genito-Urinary: no dysuria, trouble voiding, or hematuria Hematological and Lymphatic: negative Breast: negative for breast lumps Musculoskeletal: negative Remaining ROS negative.  Physical Exam: Blood pressure 183/94, pulse 84, temperature 98.4 F (36.9 C), height 5\' 3"  (1.6 m), weight 128 lb 9.6 oz (58.333 kg). ECOG: 0 General appearance: alert, cooperative and appears stated age Head: Normocephalic, without obvious abnormality, atraumatic Neck: no adenopathy, no carotid bruit, no JVD, supple, symmetrical, trachea midline and thyroid not enlarged, symmetric, no tenderness/mass/nodules Lymph nodes: Cervical, supraclavicular, and axillary nodes normal. Cardiac : regular rate and rhythm, no murmurs or gallops Pulmonary:clear to auscultation bilaterally and normal  percussion bilaterally Breasts: inspection negative, no nipple discharge or bleeding, no masses or nodularity palpable Abdomen:soft, non-tender; bowel sounds normal; no masses,  no organomegaly Extremities negative Neuro: alert, oriented, normal speech, no focal findings or movement disorder noted  Lab Results: Lab Results  Component Value Date   WBC 7.1 03/03/2011   HGB 12.8 03/03/2011   HCT 39.0 03/03/2011   MCV 86.9 03/03/2011   PLT 245 03/03/2011     Chemistry      Component Value Date/Time   NA 140 03/03/2011 1406   NA 140 03/03/2011 1406   NA 140 03/03/2011 1406   K 4.2 03/03/2011 1406   K 4.2 03/03/2011 1406   K 4.2 03/03/2011 1406   CL 101 03/03/2011 1406   CL 101 03/03/2011 1406   CL 101 03/03/2011 1406   CO2 30 03/03/2011 1406   CO2 30 03/03/2011 1406   CO2 30 03/03/2011 1406   BUN 13 03/03/2011 1406   BUN 13 03/03/2011 1406   BUN 13 03/03/2011 1406   CREATININE 0.98 03/03/2011 1406   CREATININE 0.98 03/03/2011 1406   CREATININE 0.98 03/03/2011 1406   CREATININE 0.77 10/17/2010 0901      Component Value Date/Time   CALCIUM 9.9 03/03/2011 1406   CALCIUM 9.9 03/03/2011 1406   CALCIUM 9.9 03/03/2011 1406   ALKPHOS 70 03/03/2011 1406   ALKPHOS 70 03/03/2011 1406   ALKPHOS 70 03/03/2011 1406   AST 18 03/03/2011 1406   AST 18 03/03/2011 1406   AST 18 03/03/2011 1406   ALT 21 03/03/2011 1406   ALT 21 03/03/2011 1406   ALT 21 03/03/2011 1406   BILITOT 0.6 03/03/2011 1406   BILITOT 0.6 03/03/2011 1406   BILITOT 0.6 03/03/2011 1406      .pathology. Radiological Studies: chest X-ray n/a Mammogram 10/12- wnl Bone density 11/12- osteoperosis  Impression and Plan: [name] is doing well, she is going to [place] and [place] over the holidays. She has bee in [facility] for 5 yrs and has ongoing osteoperosis, so i discussed stopping at this point. I will see in 1 yr.  More than 50% of the visit was spent in patient-related counselling   Pierce Crane,  MD 12/21/20121:27 PM

## 2011-04-03 ENCOUNTER — Encounter (INDEPENDENT_AMBULATORY_CARE_PROVIDER_SITE_OTHER): Payer: Self-pay | Admitting: General Surgery

## 2011-04-03 DIAGNOSIS — Z170 Estrogen receptor positive status [ER+]: Secondary | ICD-10-CM | POA: Insufficient documentation

## 2011-04-03 DIAGNOSIS — C50411 Malignant neoplasm of upper-outer quadrant of right female breast: Secondary | ICD-10-CM

## 2011-04-04 ENCOUNTER — Encounter (INDEPENDENT_AMBULATORY_CARE_PROVIDER_SITE_OTHER): Payer: Self-pay | Admitting: Surgery

## 2011-04-04 ENCOUNTER — Ambulatory Visit (INDEPENDENT_AMBULATORY_CARE_PROVIDER_SITE_OTHER): Payer: Medicare Other | Admitting: Surgery

## 2011-04-04 VITALS — BP 128/76 | HR 70 | Temp 97.9°F | Resp 16 | Ht 64.0 in | Wt 130.0 lb

## 2011-04-04 DIAGNOSIS — Z853 Personal history of malignant neoplasm of breast: Secondary | ICD-10-CM

## 2011-04-04 NOTE — Progress Notes (Signed)
NAME: [name]       DOB: [birth year]           DATE: 04/04/2011       MRN: [id number]   Mary [name] is a 78 y.o.Marland Kitchenfemale who presents for routine followup of her Right breast cancer, IDC, Stage II, ER,Her 2+ diagnosed in 2002 and treated with mastectomy, anti-estrogen. She has no problems or concerns on either side.  PFSH: She has had no significant changes since the last visit here.  ROS: There have been no significant changes since the last visit here  EXAM: General: The patient is alert, oriented, generally healty appearing, NAD. Mood and affect are normal.  Breasts:  Right s/p mastectomy with no evidence of recurrence. Left normal to inspection and palpation  Lymphatics: She has no axillary or supraclavicular adenopathy on either side.  Extremities: Full ROM of the surgical side with no lymphedema noted.  Data Reviewed: Mammogram at [facility] this Fall negative  Impression: Doing well, with no evidence of recurrent cancer or new cancer  Plan: Will continue to follow up on an annual basis here.Dr Donnie Coffin wants her to see [place] in six months and him in a year. I believe if she wishes she can stop F/U here.

## 2011-05-04 ENCOUNTER — Other Ambulatory Visit: Payer: Self-pay | Admitting: Internal Medicine

## 2011-05-04 DIAGNOSIS — R002 Palpitations: Secondary | ICD-10-CM

## 2011-05-04 MED ORDER — LANOXIN 125 MCG PO TABS: 125.0000 ug | ORAL_TABLET | Freq: Every day | ORAL | Status: DC

## 2011-05-04 MED ORDER — CALAN SR 240 MG PO TBCR: 240.0000 mg | EXTENDED_RELEASE_TABLET | Freq: Every day | ORAL | Status: DC

## 2011-05-04 NOTE — Telephone Encounter (Signed)
done

## 2011-05-04 NOTE — Telephone Encounter (Signed)
Also needs a refill on lanoxin 0.125mg  #90

## 2011-05-08 DIAGNOSIS — H40019 Open angle with borderline findings, low risk, unspecified eye: Secondary | ICD-10-CM | POA: Diagnosis not present

## 2011-05-12 DIAGNOSIS — L658 Other specified nonscarring hair loss: Secondary | ICD-10-CM | POA: Diagnosis not present

## 2011-05-17 DIAGNOSIS — N312 Flaccid neuropathic bladder, not elsewhere classified: Secondary | ICD-10-CM | POA: Diagnosis not present

## 2011-05-17 DIAGNOSIS — R339 Retention of urine, unspecified: Secondary | ICD-10-CM | POA: Diagnosis not present

## 2011-05-26 ENCOUNTER — Encounter: Payer: Self-pay | Admitting: Internal Medicine

## 2011-06-01 ENCOUNTER — Ambulatory Visit (INDEPENDENT_AMBULATORY_CARE_PROVIDER_SITE_OTHER): Payer: Medicare Other | Admitting: Family Medicine

## 2011-06-01 ENCOUNTER — Encounter: Payer: Self-pay | Admitting: Family Medicine

## 2011-06-01 VITALS — BP 134/84 | HR 80 | Ht 64.0 in | Wt 129.0 lb

## 2011-06-01 DIAGNOSIS — R002 Palpitations: Secondary | ICD-10-CM

## 2011-06-01 DIAGNOSIS — I10 Essential (primary) hypertension: Secondary | ICD-10-CM | POA: Diagnosis not present

## 2011-06-01 DIAGNOSIS — E7800 Pure hypercholesterolemia, unspecified: Secondary | ICD-10-CM

## 2011-06-01 DIAGNOSIS — M810 Age-related osteoporosis without current pathological fracture: Secondary | ICD-10-CM | POA: Insufficient documentation

## 2011-06-01 DIAGNOSIS — E782 Mixed hyperlipidemia: Secondary | ICD-10-CM | POA: Diagnosis not present

## 2011-06-01 LAB — LIPID PANEL
HDL: 61 mg/dL (ref >39)
Total CHOL/HDL Ratio: 3.9 Ratio
VLDL: 60 mg/dL — ABNORMAL HIGH (ref 0–40)

## 2011-06-01 MED ORDER — ATORVASTATIN CALCIUM 40 MG PO TABS: 40.0000 mg | ORAL_TABLET | Freq: Every day | ORAL | Status: DC

## 2011-06-01 NOTE — Progress Notes (Signed)
Patient presents for fasting med check.  She has no specific complaints.  HTN and tachycardia: Doesn't check BP elsewhere.  Denies headaches, dizziness.  Denies tachycardia, or chest pain.  Was taken off Femara by Dr. Donnie Coffin in December, had been on it for 5 years.  Got good reports from Dr. Donnie Coffin and Dr. Jamey Ripa.  Has some ongoing R knee and L hip pain.  Cortisone shot to L hip helped for a 4-5 months.  Shots didn't help knee too much.  Uses Aleve, tylenol or advil if needed for pain, but doesn't take medication daily.  Dr. Patsi Sears rx'd Bethanechol 25mg  BID for her bladder, and it seems to be the only thing that has helped with her urinary symptoms. It was started the end of July  She has been treated for hair loss by Dr. Baruch Merl topical drops, and had gotten steroid injections.  Hair loss seems to have decreased.  Past Medical History  Diagnosis Date  . Heart palpitations   . Breast cancer 10/07    right; invasive ductal CA; Dr. Donnie Coffin  . Impaired fasting glucose   . Mixed hyperlipidemia   . Constipation   . External hemorrhoid   . Adenomatous colon polyp 3/03  . Colonic mass 2007    high grade dysplasia (s/p L hemicolectomy)  . BCC (basal cell carcinoma), face 8/06    L nasolabial fold  . CTS (carpal tunnel syndrome)     right  . Osteoporosis     DEXA 01/2009; T-3.2 L fem neck, -2.4 L radius    Past Surgical History  Procedure Date  . Mastectomy 12/07    RIGHT BREAST  . Colon surgery 6/07    high grade dysplasia; neg LN; lap-assisted surgery  . Appendectomy age 9  . Tonsillectomy age 65  . Cataract extraction, bilateral R '93; L '91  . Carpal tunnel release 2/09    Right; Dr. Mina Marble  . Trigger finger release R 2/09; L 5/09    Dr. Mina Marble  . Colonoscopy 05/20/2008    repeat due 05/2011    History   Social History  . Marital Status: Widowed    Spouse Name: N/A    Number of Children: 3  . Years of Education: N/A   Occupational History  . Not on file.    Social History Main Topics  . Smoking status: Never Smoker   . Smokeless tobacco: Never Used  . Alcohol Use: Yes     very seldom  . Drug Use: No  . Sexually Active: Not on file   Other Topics Concern  . Not on file   Social History Narrative   Children live in [place] and [place]. She has 1 son and 2 daughters; brother and nephew live in [place]. Widowed '94    Family History  Problem Relation Age of Onset  . Hypertension Mother   . Stroke Father   . Hyperlipidemia Brother   . Breast cancer Maternal Aunt   . Breast cancer Paternal Aunt   . Breast cancer Cousin   . Colon cancer Cousin   . Breast cancer Cousin    Current Outpatient Prescriptions on File Prior to Visit  Medication Sig Dispense Refill  . atorvastatin (LIPITOR) 40 MG tablet Take 1 tablet (40 mg total) by mouth daily.  90 tablet  1  . bethanechol (URECHOLINE) 25 MG tablet Take 25 mg by mouth 2 (two) times daily.        Marland Kitchen CALAN SR 240 MG CR tablet Take 1  tablet (240 mg total) by mouth at bedtime.  90 tablet  1  . Calcium Carbonate-Vitamin D (CALCIUM 600+D) 600-400 MG-UNIT per tablet Take 2 tablets by mouth daily.        . Cholecalciferol (VITAMIN D3) 2000 UNITS TABS Take 2,000 Units by mouth daily.      Marland Kitchen LANOXIN 0.125 MG tablet Take 1 tablet (125 mcg total) by mouth daily.  90 tablet  1  . Multiple Vitamins-Minerals (CENTRUM PO) Take by mouth.          Allergies  Allergen Reactions  . Avelox (Moxifloxacin Hcl In Nacl) Nausea Only  . Biaxin Itching and Nausea Only  . Arimidex (Anastrozole) Rash  . Bactrim Nausea Only and Rash  . Penicillins Nausea Only and Rash  . Sulfa Drugs Cross Reactors Nausea And Vomiting and Rash   ROS: Denies fevers, URI symptoms.  Constipation improved with prune juice.  Denies blood in stools.  Last colonoscopy 2010.  See HPI  PHYSICAL EXAM: BP 134/84  Pulse 80  Ht 5\' 4"  (1.626 m)  Wt 129 lb (58.514 kg)  BMI 22.14 kg/m2 134/84 by MD Well developed, pleasant female in no  distress Neck: no lymphadenopathy or mass Heart: regular rate and rhythm without murmur Lungs: clear bilaterally Abdomen: soft, nontender, no organomegaly or mass Extremities: no edema, 2+ pulse Skin: no rash Psych: normal mood, affect, hygiene and grooming  ASSESSMENT/PLAN: 1. Pure hypercholesterolemia  atorvastatin (LIPITOR) 40 MG tablet, Lipid panel  2. Palpitations    3. Mixed hyperlipidemia  Lipid panel  4. Essential hypertension, benign    5. Osteoporosis     Lipids today.  Other labs done by oncologist recently.  Osteoporosis.  Discussed Evista briefly--she is very hesitant. She would prefer to discuss with Dr. Donnie Coffin at next visit  Schedule CPE (won't need breast exam--will need discussion of shingles vaccine, pneumovax booster and TdaP, which isn't available today), and needs pelvic exam.  She states she hasn't had a pelvic exam in many years.

## 2011-06-01 NOTE — Patient Instructions (Signed)
Continue all current medications  Discuss Evista with Dr. Douglass Rivers is a medication for osteoporosis

## 2011-06-22 DIAGNOSIS — H6120 Impacted cerumen, unspecified ear: Secondary | ICD-10-CM | POA: Diagnosis not present

## 2011-07-13 DIAGNOSIS — L658 Other specified nonscarring hair loss: Secondary | ICD-10-CM | POA: Diagnosis not present

## 2011-07-13 DIAGNOSIS — L439 Lichen planus, unspecified: Secondary | ICD-10-CM | POA: Diagnosis not present

## 2011-07-17 DIAGNOSIS — M1710 Unilateral primary osteoarthritis, unspecified knee: Secondary | ICD-10-CM | POA: Diagnosis not present

## 2011-08-08 DIAGNOSIS — M169 Osteoarthritis of hip, unspecified: Secondary | ICD-10-CM | POA: Diagnosis not present

## 2011-08-08 DIAGNOSIS — M1710 Unilateral primary osteoarthritis, unspecified knee: Secondary | ICD-10-CM | POA: Diagnosis not present

## 2011-08-15 DIAGNOSIS — M1710 Unilateral primary osteoarthritis, unspecified knee: Secondary | ICD-10-CM | POA: Diagnosis not present

## 2011-08-22 DIAGNOSIS — M1710 Unilateral primary osteoarthritis, unspecified knee: Secondary | ICD-10-CM | POA: Diagnosis not present

## 2011-08-31 ENCOUNTER — Encounter: Payer: Self-pay | Admitting: Family Medicine

## 2011-08-31 ENCOUNTER — Other Ambulatory Visit (HOSPITAL_COMMUNITY)
Admission: RE | Admit: 2011-08-31 | Discharge: 2011-08-31 | Disposition: A | Discharge status: Home / Self Care | Payer: Medicare Other | Source: Ambulatory Visit | Attending: Family Medicine | Admitting: Family Medicine

## 2011-08-31 ENCOUNTER — Ambulatory Visit (INDEPENDENT_AMBULATORY_CARE_PROVIDER_SITE_OTHER): Payer: Medicare Other | Admitting: Family Medicine

## 2011-08-31 VITALS — BP 122/82 | HR 72 | Ht 63.0 in | Wt 127.0 lb

## 2011-08-31 DIAGNOSIS — Z01419 Encounter for gynecological examination (general) (routine) without abnormal findings: Secondary | ICD-10-CM | POA: Insufficient documentation

## 2011-08-31 DIAGNOSIS — E782 Mixed hyperlipidemia: Secondary | ICD-10-CM | POA: Diagnosis not present

## 2011-08-31 DIAGNOSIS — R7301 Impaired fasting glucose: Secondary | ICD-10-CM

## 2011-08-31 DIAGNOSIS — I10 Essential (primary) hypertension: Secondary | ICD-10-CM | POA: Diagnosis not present

## 2011-08-31 DIAGNOSIS — Z853 Personal history of malignant neoplasm of breast: Secondary | ICD-10-CM

## 2011-08-31 DIAGNOSIS — R002 Palpitations: Secondary | ICD-10-CM

## 2011-08-31 DIAGNOSIS — Z23 Encounter for immunization: Secondary | ICD-10-CM

## 2011-08-31 DIAGNOSIS — M810 Age-related osteoporosis without current pathological fracture: Secondary | ICD-10-CM

## 2011-08-31 DIAGNOSIS — Z0000 Encounter for general adult medical examination without abnormal findings: Secondary | ICD-10-CM

## 2011-08-31 LAB — POCT URINALYSIS DIPSTICK
Bilirubin, UA: NEGATIVE
Blood, UA: NEGATIVE
Ketones, UA: NEGATIVE
Nitrite, UA: NEGATIVE
Protein, UA: NEGATIVE
pH, UA: 7

## 2011-08-31 LAB — HM PAP SMEAR: HM Pap smear: NORMAL

## 2011-08-31 NOTE — Progress Notes (Signed)
Chief Complaint  Patient presents with  . Med check plus    needs pelvic exam. Would like to have CXR as she hasn't in a long time but is not currently having any problems. Has colonoscopy scheduled for the 24th of this month with Dr.Buccini.   HPI: Patient present for f/u on her chronic medical problems, and for breast/pelvic exam.  Hyperlipidemia--last checked 3 months ago, at which time TG was much higher than at prior check (up from 173 to 300).  She admits to having more sweets/candy, and didn't tolerate taking fish oil (had trouble swallowing the med, would get stuck and make her gag). She has been working on diet and is due for recheck. Denies side effects from medications.  Blood pressures have been good.  Denies dizziness, chest pain, palpitations, feet swelling. Palpitations--well controlled with her medications.  Breast cancer--followed by Dr. Jamey Ripa and Dr. Donnie Coffin.  She denies any breast concerns.  Colon polyps and dysplastic mass--due for colonoscopy, and scheduled for later this month.  Denies GI complaints, blood in stool.  Health maintenance: Immunization History  Administered Date(s) Administered  . Influenza Split 12/01/2010  . Pneumococcal Polysaccharide 12/19/2002  . Td 11/19/2003  mammo: UTD Colonoscopy: 05/2008, due again now Pap: 2009. No h/o abnormal paps. She would like to have pap smear done today DEXA 2012 (ordered by Dr. Donnie Coffin)  Past Medical History  Diagnosis Date  . Heart palpitations   . Breast cancer 10/07    right; invasive ductal CA; Dr. Donnie Coffin  . Impaired fasting glucose   . Mixed hyperlipidemia   . Constipation   . External hemorrhoid   . Adenomatous colon polyp 3/03  . Colonic mass 2007    high grade dysplasia (s/p L hemicolectomy)  . BCC (basal cell carcinoma), face 8/06    L nasolabial fold  . CTS (carpal tunnel syndrome)     right  . Osteoporosis     DEXA 01/2009; T-3.2 L fem neck, -2.4 L radius    Past Surgical History  Procedure  Date  . Mastectomy 12/07    RIGHT BREAST  . Colon surgery 6/07    high grade dysplasia; neg LN; lap-assisted surgery  . Appendectomy age 29  . Tonsillectomy age 42  . Cataract extraction, bilateral R '93; L '91  . Carpal tunnel release 2/09    Right; Dr. Mina Marble  . Trigger finger release R 2/09; L 5/09    Dr. Mina Marble  . Colonoscopy 05/20/2008    repeat due 05/2011    History   Social History  . Marital Status: Widowed    Spouse Name: N/A    Number of Children: 3  . Years of Education: N/A   Occupational History  . Not on file.   Social History Main Topics  . Smoking status: Never Smoker   . Smokeless tobacco: Never Used  . Alcohol Use: Yes     very seldom  . Drug Use: No  . Sexually Active: Not on file   Other Topics Concern  . Not on file   Social History Narrative   Children live in [place] and [place]. She has 1 son and 2 daughters; brother and nephew live in [place]. Widowed '94    Family History  Problem Relation Age of Onset  . Hypertension Mother   . Stroke Father   . Hyperlipidemia Brother   . Breast cancer Maternal Aunt   . Breast cancer Paternal Aunt   . Breast cancer Cousin   . Colon cancer  Cousin   . Breast cancer Cousin     Current outpatient prescriptions:atorvastatin (LIPITOR) 40 MG tablet, Take 1 tablet (40 mg total) by mouth daily., Disp: 90 tablet, Rfl: 1;  bethanechol (URECHOLINE) 25 MG tablet, Take 25 mg by mouth 2 (two) times daily.  , Disp: , Rfl: ;  CALAN SR 240 MG CR tablet, Take 1 tablet (240 mg total) by mouth at bedtime., Disp: 90 tablet, Rfl: 1 Calcium Carbonate-Vitamin D (CALCIUM 600+D) 600-400 MG-UNIT per tablet, Take 2 tablets by mouth daily.  , Disp: , Rfl: ;  Cholecalciferol (VITAMIN D3) 2000 UNITS TABS, Take 2,000 Units by mouth daily., Disp: , Rfl: ;  LANOXIN 0.125 MG tablet, Take 1 tablet (125 mcg total) by mouth daily., Disp: 90 tablet, Rfl: 1;  Multiple Vitamins-Minerals (CENTRUM PO), Take by mouth.  , Disp: , Rfl:   Allergies    Allergen Reactions  . Avelox (Moxifloxacin Hcl In Nacl) Nausea Only  . Clarithromycin Itching and Nausea Only  . Arimidex (Anastrozole) Rash  . Bactrim Nausea Only and Rash  . Penicillins Nausea Only and Rash  . Sulfa Drugs Cross Reactors Nausea And Vomiting and Rash   ROS:  Urinary symptoms improved with bethanechol.  Hair loss stable/improved with treatments from Dr. Emily Filbert.  R knee and L hip pain--saw Dr. Lequita Halt and had shots weekly x 3 into R knee, but hasn't noticed much improvement.  L hip has improved some, but thinks he will need another injection. Denies headaches, dizziness, URI symptoms, cough, shortness of breath, chest pain, leg swelling, or other concerns.  PHYSICAL EXAM: BP 122/82  Pulse 72  Ht 5\' 3"  (1.6 m)  Wt 127 lb (57.607 kg)  BMI 22.50 kg/m2 Well developed, pleasant female in no distress HEENT:  PERRL, EOMI, conjunctiva clear.  TM's and EAC's normal.  OP clear. Neck: no lymphadenopathy, thyromegaly or carotid bruit. Heart: regular rate and rhythm without murmur or ectopy Lungs: clear bilaterally with good air movement Abdomen: soft, nontender, no organomegaly or mass.   Breasts:  Absent R breast.  WHSS, no mass or axillary lymphadenopathy. L breast--normal, without mass, nipple discharge, adenopathy. External genitalia:  Atrophic changes, no other lesions.  Cervix appears normal without lesions, no cervical motion tenderness.  Uterus normal, not enlarged. Adnexa norma, nontender, no mass Rectal exam: heme negative stool, no mass, normal sphincter tone Skin: no rash Psych: normal mood, affect, hygiene and grooming Neuro: alert and oriented.  Cranial nerves intact. DTR's symmetric, normal strength, sensation and gait  ASSESSMENT/PLAN: 1. Mixed hyperlipidemia  Lipid panel  2. Essential hypertension, benign    3. Osteoporosis    4. hx: breast cancer, right, IDC, receptor + her 2 +    5. Impaired fasting glucose  Glucose, random  6. Palpitations    7. Routine  general medical examination at a health care facility  POCT Urinalysis Dipstick, Visual acuity screening  8. Need for Tdap vaccination  Tdap vaccine greater than or equal to 7yo IM  9. Need for pneumococcal vaccination  Pneumococcal polysaccharide vaccine 23-valent greater than or equal to 2yo subcutaneous/IM  10. Routine gynecological examination  Cytology - PAP   Schedule fasting lipids.  If TG remains well over 200, then consider giving trial of sample of Lovaza, vs low dose fenofibrate.  Lowfat diet reviewed in detail  HTN and palpitations are controlled.  Pneumovax and TdaP given today. shingles vaccine is recommended--given rx to get at pharmacy (if covered by insurance). Risks/side effects reviewed. Flu shot recommended every fall  F/u as scheduled for colonoscopy, f/u breast cancer with Dr. Jamey Ripa and oncologist.  Discussed that CXR's aren't recommended for screening purposes.  She is a nonsmoker.  She had a normal CXR in 2010 (showing some hyperinflation consistent with COPD).  45 minute visit, all face to face and >1/2 spent counseling

## 2011-08-31 NOTE — Patient Instructions (Signed)
Continue to follow a lowfat diet, and avoid sweets. Consider getting the shingles vaccine--you were given a prescription to take to get at a pharmacy.  You may want to check with insurance to see your cost, but I recommend getting the vaccine.

## 2011-09-04 ENCOUNTER — Encounter: Payer: Self-pay | Admitting: Family Medicine

## 2011-09-11 DIAGNOSIS — Z86010 Personal history of colonic polyps: Secondary | ICD-10-CM | POA: Diagnosis not present

## 2011-09-11 DIAGNOSIS — Z09 Encounter for follow-up examination after completed treatment for conditions other than malignant neoplasm: Secondary | ICD-10-CM | POA: Diagnosis not present

## 2011-09-13 DIAGNOSIS — L658 Other specified nonscarring hair loss: Secondary | ICD-10-CM | POA: Diagnosis not present

## 2011-09-13 DIAGNOSIS — L821 Other seborrheic keratosis: Secondary | ICD-10-CM | POA: Diagnosis not present

## 2011-09-13 DIAGNOSIS — L439 Lichen planus, unspecified: Secondary | ICD-10-CM | POA: Diagnosis not present

## 2011-09-27 DIAGNOSIS — M169 Osteoarthritis of hip, unspecified: Secondary | ICD-10-CM | POA: Diagnosis not present

## 2011-09-28 ENCOUNTER — Other Ambulatory Visit: Payer: Medicare Other

## 2011-09-28 DIAGNOSIS — R7301 Impaired fasting glucose: Secondary | ICD-10-CM

## 2011-09-28 DIAGNOSIS — E782 Mixed hyperlipidemia: Secondary | ICD-10-CM | POA: Diagnosis not present

## 2011-09-28 LAB — LIPID PANEL
HDL: 63 mg/dL (ref >39)
LDL Cholesterol: 123 mg/dL — ABNORMAL HIGH (ref 0–99)
Total CHOL/HDL Ratio: 3.3 Ratio

## 2011-09-28 LAB — GLUCOSE, RANDOM: Glucose, Bld: 101 mg/dL — ABNORMAL HIGH (ref 70–99)

## 2011-10-04 ENCOUNTER — Encounter: Payer: Self-pay | Admitting: Family Medicine

## 2011-10-04 ENCOUNTER — Encounter (INDEPENDENT_AMBULATORY_CARE_PROVIDER_SITE_OTHER): Payer: Self-pay | Admitting: Surgery

## 2011-10-04 ENCOUNTER — Ambulatory Visit (INDEPENDENT_AMBULATORY_CARE_PROVIDER_SITE_OTHER): Payer: Medicare Other | Admitting: Surgery

## 2011-10-04 VITALS — BP 136/80 | HR 80 | Temp 98.6°F | Resp 16 | Ht 63.5 in | Wt 130.2 lb

## 2011-10-04 DIAGNOSIS — Z853 Personal history of malignant neoplasm of breast: Secondary | ICD-10-CM | POA: Diagnosis not present

## 2011-10-04 NOTE — Patient Instructions (Addendum)
We will see you again on an as needed basis. Please call the office at [phone] if you have any questions or concerns. Thank you for allowing us to take care of you.  

## 2011-10-04 NOTE — Progress Notes (Signed)
NAME: Mary [name]       DOB: 07/24/1926           DATE: 10/04/2011       MRN: [id number]   [name] is a 78 y.o.Marland Kitchenfemale who presents for routine followup of her Right breast cancer, IDC, Stage II, ER,Her 2+ diagnosed in 2002 and treated with mastectomy, anti-estrogen. She has no problems or concerns on either side.  PFSH: She has had no significant changes since the last visit here.  ROS: There have been no significant changes since the last visit here  EXAM: General: The patient is alert, oriented, generally healty appearing, NAD. Mood and affect are normal.  Breasts:  Right s/p mastectomy with no evidence of recurrence. Left normal to inspection and palpation  Lymphatics: She has no axillary or supraclavicular adenopathy on either side.  Extremities: Almost full ROM of the surgical side with no lymphedema noted.  Data Reviewed: Mammogram at [facility] this Fall negative  Impression: Doing well, with no evidence of recurrent cancer or new cancer  Plan: She is now well over 5 years from surgery. I think we can see her here on a when necessary basis. I recommended that she continue to have annual mammograms.

## 2011-10-25 ENCOUNTER — Encounter: Payer: Medicare Other | Admitting: Family Medicine

## 2011-11-07 DIAGNOSIS — Z961 Presence of intraocular lens: Secondary | ICD-10-CM | POA: Diagnosis not present

## 2011-11-07 DIAGNOSIS — H2640 Unspecified secondary cataract: Secondary | ICD-10-CM | POA: Diagnosis not present

## 2011-11-07 DIAGNOSIS — H40019 Open angle with borderline findings, low risk, unspecified eye: Secondary | ICD-10-CM | POA: Diagnosis not present

## 2011-11-07 DIAGNOSIS — H52209 Unspecified astigmatism, unspecified eye: Secondary | ICD-10-CM | POA: Diagnosis not present

## 2011-11-13 ENCOUNTER — Telehealth: Payer: Self-pay | Admitting: Internal Medicine

## 2011-11-13 DIAGNOSIS — R002 Palpitations: Secondary | ICD-10-CM

## 2011-11-13 MED ORDER — LANOXIN 125 MCG PO TABS: 125.0000 ug | ORAL_TABLET | Freq: Every day | ORAL | Status: DC

## 2011-11-13 MED ORDER — CALAN SR 240 MG PO TBCR: 240.0000 mg | EXTENDED_RELEASE_TABLET | Freq: Every day | ORAL | Status: DC

## 2011-11-13 NOTE — Telephone Encounter (Signed)
Also needs a refill on calan SR 240mg  #90

## 2011-11-13 NOTE — Telephone Encounter (Signed)
done

## 2011-11-15 DIAGNOSIS — L439 Lichen planus, unspecified: Secondary | ICD-10-CM | POA: Diagnosis not present

## 2012-01-02 DIAGNOSIS — L439 Lichen planus, unspecified: Secondary | ICD-10-CM | POA: Diagnosis not present

## 2012-01-02 DIAGNOSIS — L820 Inflamed seborrheic keratosis: Secondary | ICD-10-CM | POA: Diagnosis not present

## 2012-01-08 ENCOUNTER — Telehealth: Payer: Self-pay | Admitting: Family Medicine

## 2012-01-08 DIAGNOSIS — E7800 Pure hypercholesterolemia, unspecified: Secondary | ICD-10-CM

## 2012-01-08 DIAGNOSIS — Z1231 Encounter for screening mammogram for malignant neoplasm of breast: Secondary | ICD-10-CM | POA: Diagnosis not present

## 2012-01-08 LAB — HM MAMMOGRAPHY

## 2012-01-08 MED ORDER — ATORVASTATIN CALCIUM 40 MG PO TABS: 40.0000 mg | ORAL_TABLET | Freq: Every day | ORAL | Status: DC

## 2012-01-08 NOTE — Telephone Encounter (Signed)
Done

## 2012-01-09 ENCOUNTER — Encounter: Payer: Self-pay | Admitting: Internal Medicine

## 2012-01-09 DIAGNOSIS — Z23 Encounter for immunization: Secondary | ICD-10-CM | POA: Diagnosis not present

## 2012-01-17 DIAGNOSIS — N312 Flaccid neuropathic bladder, not elsewhere classified: Secondary | ICD-10-CM | POA: Diagnosis not present

## 2012-02-05 DIAGNOSIS — H6120 Impacted cerumen, unspecified ear: Secondary | ICD-10-CM | POA: Diagnosis not present

## 2012-02-20 DIAGNOSIS — T1510XA Foreign body in conjunctival sac, unspecified eye, initial encounter: Secondary | ICD-10-CM | POA: Diagnosis not present

## 2012-02-27 DIAGNOSIS — L439 Lichen planus, unspecified: Secondary | ICD-10-CM | POA: Diagnosis not present

## 2012-02-28 ENCOUNTER — Encounter: Payer: Medicare Other | Admitting: Medical

## 2012-03-01 ENCOUNTER — Other Ambulatory Visit (HOSPITAL_BASED_OUTPATIENT_CLINIC_OR_DEPARTMENT_OTHER): Payer: Medicare Other | Admitting: Lab

## 2012-03-01 DIAGNOSIS — E559 Vitamin D deficiency, unspecified: Secondary | ICD-10-CM

## 2012-03-01 DIAGNOSIS — M810 Age-related osteoporosis without current pathological fracture: Secondary | ICD-10-CM

## 2012-03-01 DIAGNOSIS — C50919 Malignant neoplasm of unspecified site of unspecified female breast: Secondary | ICD-10-CM

## 2012-03-01 LAB — CBC WITH DIFFERENTIAL/PLATELET
Basophils Absolute: 0.1 10*3/uL (ref 0.0–0.1)
EOS%: 3.5 % (ref 0.0–7.0)
Eosinophils Absolute: 0.2 10*3/uL (ref 0.0–0.5)
HCT: 39.6 % (ref 34.8–46.6)
HGB: 13.4 g/dL (ref 11.6–15.9)
MCH: 29.5 pg (ref 25.1–34.0)
MONO#: 0.6 10*3/uL (ref 0.1–0.9)
NEUT#: 4.2 10*3/uL (ref 1.5–6.5)
NEUT%: 63.8 % (ref 38.4–76.8)
RDW: 14.4 % (ref 11.2–14.5)
WBC: 6.6 10*3/uL (ref 3.9–10.3)
lymph#: 1.5 10*3/uL (ref 0.9–3.3)

## 2012-03-01 LAB — COMPREHENSIVE METABOLIC PANEL (CC13)
Albumin: 3.9 g/dL (ref 3.5–5.0)
Alkaline Phosphatase: 86 U/L (ref 40–150)
BUN: 13 mg/dL (ref 7.0–26.0)
Calcium: 10 mg/dL (ref 8.4–10.4)
Chloride: 102 mEq/L (ref 98–107)
Glucose: 87 mg/dl (ref 70–99)
Potassium: 3.9 mEq/L (ref 3.5–5.1)
Sodium: 144 mEq/L (ref 136–145)
Total Protein: 7.5 g/dL (ref 6.4–8.3)

## 2012-03-08 ENCOUNTER — Other Ambulatory Visit: Payer: Medicare Other | Admitting: Lab

## 2012-03-08 ENCOUNTER — Ambulatory Visit (HOSPITAL_BASED_OUTPATIENT_CLINIC_OR_DEPARTMENT_OTHER): Payer: Medicare Other | Admitting: Oncology

## 2012-03-08 VITALS — BP 145/76 | HR 85 | Temp 98.4°F | Resp 20 | Ht 63.5 in | Wt 130.3 lb

## 2012-03-08 DIAGNOSIS — Z170 Estrogen receptor positive status [ER+]: Secondary | ICD-10-CM

## 2012-03-08 DIAGNOSIS — M810 Age-related osteoporosis without current pathological fracture: Secondary | ICD-10-CM | POA: Diagnosis not present

## 2012-03-08 DIAGNOSIS — C50919 Malignant neoplasm of unspecified site of unspecified female breast: Secondary | ICD-10-CM | POA: Diagnosis not present

## 2012-03-08 NOTE — Progress Notes (Signed)
Hematology and Oncology Follow Up Visit  Mary [name] [id number] [birth year] 78 y.o. 03/08/2012 2:56 PM PCP [facility]  Principle Diagnosis: T2N0 breast cancer, er+ s/p mrm , 2007, on arimidex , now femara, s/p completion of 5 years of hormonal therapy., 2012  Interim History:  There have been no intercurrent illness, hospitalizations or medication changes.  Medications: I have reviewed the patient's current medications.  Allergies:  Allergies  Allergen Reactions  . Avelox (Moxifloxacin Hcl In Nacl) Nausea Only  . Clarithromycin Itching and Nausea Only  . Arimidex (Anastrozole) Rash  . Bactrim Nausea Only and Rash  . Penicillins Nausea Only and Rash  . Sulfa Drugs Cross Reactors Nausea And Vomiting and Rash    Past Medical History, Surgical history, Social history, and Family History were reviewed and updated. She has been doing well , with no intercurrent illness or hospitilzations.   Review of Systems: Constitutional:  Negative for fever, chills, night sweats, anorexia, weight loss, pain. Cardiovascular: no chest pain or dyspnea on exertion Respiratory: no cough, shortness of breath, or wheezing Neurological: negative Dermatological: negative ENT: negative Skin Gastrointestinal: no abdominal pain, change in bowel habits, or black or bloody stools Genito-Urinary: no dysuria, trouble voiding, or hematuria Hematological and Lymphatic: negative Breast: negative for breast lumps Musculoskeletal: negative Remaining ROS negative.  Physical Exam: Blood pressure 145/76, pulse 85, temperature 98.4 F (36.9 C), temperature source Oral, resp. rate 20, height 5' 3.5" (1.613 m), weight 130 lb 4.8 oz (59.104 kg). ECOG: 0 General appearance: alert, cooperative and appears stated age Head: Normocephalic, without obvious abnormality, atraumatic Neck: no adenopathy, no carotid bruit, no JVD, supple, symmetrical, trachea midline and thyroid not enlarged, symmetric, no  tenderness/mass/nodules Lymph nodes: Cervical, supraclavicular, and axillary nodes normal. Cardiac : regular rate and rhythm, no murmurs or gallops Pulmonary:clear to auscultation bilaterally and normal percussion bilaterally Breasts: inspection negative, no nipple discharge or bleeding, no masses or nodularity palpable in lt reast, rt chest wall is unremarkable. Abdomen:soft, non-tender; bowel sounds normal; no masses,  no organomegaly Extremities negative Neuro: alert, oriented, normal speech, no focal findings or movement disorder noted  Lab Results: Lab Results  Component Value Date   WBC 6.6 03/01/2012   HGB 13.4 03/01/2012   HCT 39.6 03/01/2012   MCV 87.3 03/01/2012   PLT 242 03/01/2012     Chemistry      Component Value Date/Time   NA 144 03/01/2012 0912   NA 140 03/03/2011 1406   NA 140 03/03/2011 1406   NA 140 03/03/2011 1406   K 3.9 03/01/2012 0912   K 4.2 03/03/2011 1406   K 4.2 03/03/2011 1406   K 4.2 03/03/2011 1406   CL 102 03/01/2012 0912   CL 101 03/03/2011 1406   CL 101 03/03/2011 1406   CL 101 03/03/2011 1406   CO2 30* 03/01/2012 0912   CO2 30 03/03/2011 1406   CO2 30 03/03/2011 1406   CO2 30 03/03/2011 1406   BUN 13.0 03/01/2012 0912   BUN 13 03/03/2011 1406   BUN 13 03/03/2011 1406   BUN 13 03/03/2011 1406   CREATININE 0.9 03/01/2012 0912   CREATININE 0.98 03/03/2011 1406   CREATININE 0.98 03/03/2011 1406   CREATININE 0.98 03/03/2011 1406   CREATININE 0.77 10/17/2010 0901      Component Value Date/Time   CALCIUM 10.0 03/01/2012 0912   CALCIUM 9.9 03/03/2011 1406   CALCIUM 9.9 03/03/2011 1406   CALCIUM 9.9 03/03/2011 1406   ALKPHOS 86 03/01/2012 0912   ALKPHOS 70  03/03/2011 1406   ALKPHOS 70 03/03/2011 1406   ALKPHOS 70 03/03/2011 1406   AST 17 03/01/2012 0912   AST 18 03/03/2011 1406   AST 18 03/03/2011 1406   AST 18 03/03/2011 1406   ALT 22 03/01/2012 0912   ALT 21 03/03/2011 1406   ALT 21 03/03/2011 1406   ALT 21 03/03/2011 1406    BILITOT 0.70 03/01/2012 0912   BILITOT 0.6 03/03/2011 1406   BILITOT 0.6 03/03/2011 1406   BILITOT 0.6 03/03/2011 1406      .pathology. Radiological Studies: chest X-ray n/a Mammogram 10/13- wnl Bone density 11/12- osteoperosis  Impression and Plan: 78 yo with hx of N-, ER+ breast cancer, s/p mastectomy followed by yr of hormonal therapy. Hx of osteoperosis, followed by primary care. F/u in 1 yr.  More than 50% of the visit was spent in patient-related counselling   Pierce Crane, MD 12/20/20132:56 PM

## 2012-03-11 ENCOUNTER — Telehealth: Payer: Self-pay | Admitting: *Deleted

## 2012-03-11 NOTE — Telephone Encounter (Signed)
Gave patient instructions for getting her 2014

## 2012-03-14 ENCOUNTER — Telehealth: Payer: Self-pay | Admitting: Internal Medicine

## 2012-03-14 DIAGNOSIS — E7800 Pure hypercholesterolemia, unspecified: Secondary | ICD-10-CM

## 2012-03-14 MED ORDER — ATORVASTATIN CALCIUM 40 MG PO TABS: 40.0000 mg | ORAL_TABLET | Freq: Every day | ORAL | Status: DC

## 2012-03-14 NOTE — Telephone Encounter (Signed)
MED SENT IN FOR ONLY 30 DAYS NOT 90

## 2012-04-03 ENCOUNTER — Encounter: Payer: Self-pay | Admitting: Family Medicine

## 2012-04-03 ENCOUNTER — Ambulatory Visit (INDEPENDENT_AMBULATORY_CARE_PROVIDER_SITE_OTHER): Payer: Medicare Other | Admitting: Family Medicine

## 2012-04-03 VITALS — BP 132/80 | HR 76 | Ht 63.0 in | Wt 130.0 lb

## 2012-04-03 DIAGNOSIS — M810 Age-related osteoporosis without current pathological fracture: Secondary | ICD-10-CM

## 2012-04-03 DIAGNOSIS — Z853 Personal history of malignant neoplasm of breast: Secondary | ICD-10-CM | POA: Diagnosis not present

## 2012-04-03 DIAGNOSIS — I10 Essential (primary) hypertension: Secondary | ICD-10-CM

## 2012-04-03 DIAGNOSIS — E782 Mixed hyperlipidemia: Secondary | ICD-10-CM | POA: Diagnosis not present

## 2012-04-03 NOTE — Patient Instructions (Addendum)
[facility] (on [address]) for your next mammogram.  You should make sure they get your records from [place] (ask them the best way to do that).  You may continue to go to [facility] for your DEXA scan if you prefer.  You aren't due for that until November 2014.   Try and get weight- bearing exercise, including your upper body (especially because your have osteoporosis in your wrists).   Try Anusol HC for hemorrhoids. If this isn't helping, you can call for prescription.  Make sure to eat high fiber diet to help prevent constipation

## 2012-04-03 NOTE — Progress Notes (Signed)
Chief Complaint  Patient presents with  . Advice Only    Dr.Rubin is leaving [facility] woudl like to discuss with you.   HPI:  Patient presents to discuss a list of concerns: Her oncologist, Dr. Donnie Coffin, is retiring.  She has been off Femara since 02/2011, and he suggested that she can just follow up with her PCP.  She would feel more comfortable to continue to be followed by oncology, asking for recommendations.  Not due for another visit until 02/2013.  Needs new recommendation for mammograms--very unhappy with [facility].  Had a terrible visit, was very painful, tech was rude and insensitive.  She paid $50 for 3D mammo, and still ended up getting (and paying) an additional bill for $100.  She would like to go somewhere else for her next mammogram.  Constipated (usually helped by prune juice).  Has a hemorrhoid flaring, and is asking for refill of proctosone--last rx was 3 years ago. Hasn't tried any OTC meds, but is willing to.  Currently isn't having significant pain or bleeding, but is swollen.  Also needs refill on atorvastatin (zero refills).  Last labs were 6 months ago.  She is not fasting today.  She denies any side effects to medications, and is following a lowfat diet.  Osteoporosis--last DEXA 01/2011.  She stopped fosamax in past due to concerns over risks, she didn't have any problems or actual side effects. She has been taking calcium and vitamin D instead.  She is willing to continue to go to [facility] for DEXA, just not for mammograms.  Past Medical History  Diagnosis Date  . Heart palpitations   . Breast cancer 10/07    right; invasive ductal CA; Dr. Donnie Coffin  . Impaired fasting glucose   . Mixed hyperlipidemia   . Constipation   . External hemorrhoid   . Adenomatous colon polyp 3/03  . Colonic mass 2007    high grade dysplasia (s/p L hemicolectomy)  . BCC (basal cell carcinoma), face 8/06    L nasolabial fold  . CTS (carpal tunnel syndrome)     right  . Osteoporosis     DEXA  01/2009; T-3.2 L fem neck, -2.4 L radius   Past Surgical History  Procedure Date  . Mastectomy 12/07    RIGHT BREAST  . Colon surgery 6/07    high grade dysplasia; neg LN; lap-assisted surgery  . Appendectomy age 11  . Tonsillectomy age 44  . Cataract extraction, bilateral R '93; L '91  . Carpal tunnel release 2/09    Right; Dr. Mina Marble  . Trigger finger release R 2/09; L 5/09    Dr. Mina Marble  . Colonoscopy 05/20/2008    repeat due 05/2011   History   Social History  . Marital Status: Widowed    Spouse Name: N/A    Number of Children: 3  . Years of Education: N/A   Occupational History  . Not on file.   Social History Main Topics  . Smoking status: Never Smoker   . Smokeless tobacco: Never Used  . Alcohol Use: Yes     Comment: very seldom  . Drug Use: No  . Sexually Active: Not on file   Other Topics Concern  . Not on file   Social History Narrative   Children live in [place] and [place]. She has 1 son and 2 daughters; brother and nephew live in [place]. Widowed '94   Current Outpatient Prescriptions on File Prior to Visit  Medication Sig Dispense Refill  .  atorvastatin (LIPITOR) 40 MG tablet Take 1 tablet (40 mg total) by mouth daily.  30 tablet  0  . bethanechol (URECHOLINE) 25 MG tablet Take 25 mg by mouth 2 (two) times daily.        Marland Kitchen CALAN SR 240 MG CR tablet Take 1 tablet (240 mg total) by mouth at bedtime.  90 tablet  2  . Calcium Carbonate-Vitamin D (CALCIUM 600+D) 600-400 MG-UNIT per tablet Take 2 tablets by mouth daily.        . Cholecalciferol (VITAMIN D3) 2000 UNITS TABS Take 2,000 Units by mouth daily.      Marland Kitchen LANOXIN 0.125 MG tablet Take 1 tablet (125 mcg total) by mouth daily.  90 tablet  2  . Multiple Vitamins-Minerals (CENTRUM PO) Take by mouth.        . VOLTAREN 1 % GEL Apply 2 g topically daily.        Allergies  Allergen Reactions  . Avelox (Moxifloxacin Hcl In Nacl) Nausea Only  . Clarithromycin Itching and Nausea Only  . Arimidex (Anastrozole)  Rash  . Bactrim Nausea Only and Rash  . Penicillins Nausea Only and Rash  . Sulfa Drugs Cross Reactors Nausea And Vomiting and Rash   ROS:  Denies fevers, URI symptoms, chest pain, palpitations, headaches, dizziness, cough, shortness of breath, nausea, vomiting, heartburn.  + constipation.  Denies dysuria, depression, skin lesions/rashes/bruising/bleeding.  See HPI.  Denies any breast concerns.  PHYSICAL EXAM: BP 132/80  Pulse 76  Ht 5\' 3"  (1.6 m)  Wt 130 lb (58.968 kg)  BMI 23.03 kg/m2 Well developed, pleasant female in no distress Neck: no lymphadenopathy or mass Heart: regular rate and rhythm Lungs: clear bilaterally Extremities: no edema Psych: normal mood, affect, hygiene and grooming Neuro: alert and oriented.  Normal gait, strength; cranial nerves grossly intact.  ASSESSMENT/PLAN: 1. Mixed hyperlipidemia  Lipid panel  2. Osteoporosis    3. hx: breast cancer, right, IDC, receptor + her 2 +    4. Essential hypertension, benign     For yearly mammograms--recommended that she change to the [facility], as she was unhappy with [facility].  Advised that films will need to be sent to [facility] so they have previous films for comparison, given her history.  H/o breast cancer-- last saw Dr. Donnie Coffin in December, not due for f/u again until 02/2013--discussed [facility] oncology vs f/u here for labs.  Will f/u with [facility].  Recommended that she call to schedule, and to ask for oncologist who sees a lot of breast cancer.  Hyperlipidemia--due for labs.  Needs refill.  Return for fasting lipid panel (had liver tests last month thru Dr. Donnie Coffin)  Osteoporosis.  Reviewed last DEXA results.  DEXA--due again 2014.   She prefers to continue at [facility] for DEXA, just not mammo (that way they will be able to compare accurately with prior studies).  Continue calcium, vitamin D.  She has osteoporosis in wrist--discussed weight bearing exercise for upper extremities in addition to walking. If any decline, push  to restart bisphosphonate  Hemorrhoid.  Will try Anusol HC OTC.  Call for rx if ineffective  HTN--well controlled.  Refill lipitor after labs reviewed.  F/u 6 months, sooner prn  35 minute visit, all questions answered.

## 2012-04-11 ENCOUNTER — Other Ambulatory Visit: Payer: Medicare Other

## 2012-04-11 DIAGNOSIS — E782 Mixed hyperlipidemia: Secondary | ICD-10-CM

## 2012-04-11 LAB — LIPID PANEL
Cholesterol: 178 mg/dL (ref 0–200)
HDL: 50 mg/dL (ref >39)
Total CHOL/HDL Ratio: 3.6 Ratio
Triglycerides: 190 mg/dL — ABNORMAL HIGH (ref <150)

## 2012-04-12 ENCOUNTER — Other Ambulatory Visit: Payer: Self-pay | Admitting: Family Medicine

## 2012-04-12 DIAGNOSIS — E7800 Pure hypercholesterolemia, unspecified: Secondary | ICD-10-CM

## 2012-04-12 MED ORDER — ATORVASTATIN CALCIUM 40 MG PO TABS: 40.0000 mg | ORAL_TABLET | Freq: Every day | ORAL | Status: DC

## 2012-04-30 DIAGNOSIS — L439 Lichen planus, unspecified: Secondary | ICD-10-CM | POA: Diagnosis not present

## 2012-06-26 ENCOUNTER — Telehealth: Payer: Self-pay | Admitting: Family Medicine

## 2012-06-26 NOTE — Telephone Encounter (Signed)
Please call, her oncologist retired and she needs your help finding a new one

## 2012-06-26 NOTE — Telephone Encounter (Signed)
Please call pt tomorrow.  We discussed this in detail at her last visit in January (see OV).  I recommended she contact [facility] to scheduled with another provider who deals with breast cancer

## 2012-06-27 ENCOUNTER — Telehealth: Payer: Self-pay | Admitting: *Deleted

## 2012-06-27 ENCOUNTER — Encounter: Payer: Self-pay | Admitting: Oncology

## 2012-06-27 NOTE — Telephone Encounter (Signed)
Spoke with patient and she was requesting to see Dr.Magrinat. I called [facility] to [facility] and spoke with Misty Stanley. She is going to call [name] today and get that switched over for her.

## 2012-06-27 NOTE — Telephone Encounter (Signed)
Pt called about her appt w/ new provider and requested to see Dr. Darnelle Catalan.  Confirmed 02/25/13 appt w/ pt.  Mailed letter & calendar to pt.

## 2012-07-10 ENCOUNTER — Telehealth: Payer: Self-pay | Admitting: Internal Medicine

## 2012-07-10 DIAGNOSIS — R002 Palpitations: Secondary | ICD-10-CM

## 2012-07-10 MED ORDER — CALAN SR 240 MG PO TBCR: 240.0000 mg | EXTENDED_RELEASE_TABLET | Freq: Every day | ORAL | Status: DC

## 2012-07-10 MED ORDER — LANOXIN 125 MCG PO TABS: 125.0000 ug | ORAL_TABLET | Freq: Every day | ORAL | Status: DC

## 2012-07-10 NOTE — Telephone Encounter (Signed)
Refill. Please review.

## 2012-07-10 NOTE — Telephone Encounter (Signed)
Refill request for calan Sr 240mg  #90, Lanoxin 0.125mg  #90 to [facility] [address]

## 2012-07-10 NOTE — Telephone Encounter (Signed)
Done, pt scheduled fasting appt for 09/30/12.

## 2012-07-10 NOTE — Telephone Encounter (Signed)
My last note says f/u 6 months (which would be in July).  She doesn't have one scheduled.  Please schedule med check (fasting) for July, and okay to refill meds until then.

## 2012-07-11 DIAGNOSIS — M1710 Unilateral primary osteoarthritis, unspecified knee: Secondary | ICD-10-CM | POA: Diagnosis not present

## 2012-07-16 DIAGNOSIS — L439 Lichen planus, unspecified: Secondary | ICD-10-CM | POA: Diagnosis not present

## 2012-07-16 DIAGNOSIS — L821 Other seborrheic keratosis: Secondary | ICD-10-CM | POA: Diagnosis not present

## 2012-07-29 DIAGNOSIS — M1710 Unilateral primary osteoarthritis, unspecified knee: Secondary | ICD-10-CM | POA: Diagnosis not present

## 2012-08-07 DIAGNOSIS — M1710 Unilateral primary osteoarthritis, unspecified knee: Secondary | ICD-10-CM | POA: Diagnosis not present

## 2012-08-14 DIAGNOSIS — M1710 Unilateral primary osteoarthritis, unspecified knee: Secondary | ICD-10-CM | POA: Diagnosis not present

## 2012-09-19 ENCOUNTER — Telehealth: Payer: Self-pay | Admitting: *Deleted

## 2012-09-19 NOTE — Telephone Encounter (Signed)
Left vm for pt to return call to r/s f/u appt with Norina Buzzard, NP

## 2012-09-26 DIAGNOSIS — J069 Acute upper respiratory infection, unspecified: Secondary | ICD-10-CM | POA: Diagnosis not present

## 2012-09-26 DIAGNOSIS — H6120 Impacted cerumen, unspecified ear: Secondary | ICD-10-CM | POA: Diagnosis not present

## 2012-09-30 ENCOUNTER — Ambulatory Visit (INDEPENDENT_AMBULATORY_CARE_PROVIDER_SITE_OTHER): Payer: Medicare Other | Admitting: Family Medicine

## 2012-09-30 ENCOUNTER — Encounter: Payer: Self-pay | Admitting: Family Medicine

## 2012-09-30 VITALS — BP 122/76 | HR 80 | Temp 98.6°F | Ht 62.0 in | Wt 123.0 lb

## 2012-09-30 DIAGNOSIS — R002 Palpitations: Secondary | ICD-10-CM

## 2012-09-30 DIAGNOSIS — E782 Mixed hyperlipidemia: Secondary | ICD-10-CM

## 2012-09-30 DIAGNOSIS — I10 Essential (primary) hypertension: Secondary | ICD-10-CM

## 2012-09-30 DIAGNOSIS — Z131 Encounter for screening for diabetes mellitus: Secondary | ICD-10-CM

## 2012-09-30 DIAGNOSIS — Z79899 Other long term (current) drug therapy: Secondary | ICD-10-CM

## 2012-09-30 DIAGNOSIS — J069 Acute upper respiratory infection, unspecified: Secondary | ICD-10-CM

## 2012-09-30 MED ORDER — LANOXIN 125 MCG PO TABS: 125.0000 ug | ORAL_TABLET | Freq: Every day | ORAL | Status: DC

## 2012-09-30 MED ORDER — CALAN SR 240 MG PO TBCR: 240.0000 mg | EXTENDED_RELEASE_TABLET | Freq: Every day | ORAL | Status: DC

## 2012-09-30 NOTE — Patient Instructions (Signed)
Drink plenty of fluids. You may use guaifenesin (robitussin, Mucinex) as needed to loosen up any thick phlegm and help with cough.  Call (either [place] or Dr. Pollyann Kennedy) for antibiotics if your mucus gets thicker/darker, if you start having fevers, or worsening symptoms. This is a sign of a bacterial infection.  Currently, you have a viral infection, and it appears to be improving.  Continue your current medications

## 2012-09-30 NOTE — Progress Notes (Signed)
Chief Complaint  Patient presents with  . Hyperlipidemia    fasting med check.   . Cough    woke up last Monday with post nasal drip, saw ENT last Thursday. Was told it was viral and to call back if not better by today and he would abx. Would like you to listen to her chest.   Traveled to [place] 3 weeks ago for a wedding, then to [place] with her daughter.  Upon return (7/10) started with PND.  She saw Dr. Pollyann Kennedy 7/10 and was diagnosed with viral URI, and had her ears cleaned out.  She thinks she is feeling better--no further fevers, finally slept well last night, didn't need Afrin. She is concerned because her son is coming to visit soon and she wants to be well. She continues to cough, have postnasal drainage.  Mucus is clear-white, occasionally slightly yellow-tinged.  Denies headaches.  Hypertension and palpitation follow-up:  Blood pressures are not checked elsewhere.  Denies dizziness, headaches, chest pain.  Denies side effects of medications.  Palpitations are well controlled.  She could feel the altitude when in [facility] shortness of breath with going up the stairs.  No problems with breathing upon her return.  Hyperlipidemia follow-up:  Patient is reportedly following a low-fat, low cholesterol diet.  Compliant with medications and denies medication side effects  Past Medical History  Diagnosis Date  . Heart palpitations   . Breast cancer 10/07    right; invasive ductal CA; Dr. Donnie Coffin  . Impaired fasting glucose   . Mixed hyperlipidemia   . Constipation   . External hemorrhoid   . Adenomatous colon polyp 3/03  . Colonic mass 2007    high grade dysplasia (s/p L hemicolectomy)  . BCC (basal cell carcinoma), face 8/06    L nasolabial fold  . CTS (carpal tunnel syndrome)     right  . Osteoporosis     DEXA 01/2009; T-3.2 L fem neck, -2.4 L radius   Past Surgical History  Procedure Laterality Date  . Mastectomy  12/07    RIGHT BREAST  . Colon surgery  6/07    high grade  dysplasia; neg LN; lap-assisted surgery  . Appendectomy  age 71  . Tonsillectomy  age 12  . Cataract extraction, bilateral  R '93; L '91  . Carpal tunnel release  2/09    Right; Dr. Mina Marble  . Trigger finger release  R 2/09; L 5/09    Dr. Mina Marble  . Colonoscopy  05/20/2008    repeat due 05/2011; pt states she had in 2013 (Buccini)   History   Social History  . Marital Status: Widowed    Spouse Name: N/A    Number of Children: 3  . Years of Education: N/A   Occupational History  . Not on file.   Social History Main Topics  . Smoking status: Never Smoker   . Smokeless tobacco: Never Used  . Alcohol Use: Yes     Comment: very seldom  . Drug Use: No  . Sexually Active: Not on file   Other Topics Concern  . Not on file   Social History Narrative   Children live in [place] and [place]. She has 1 son and 2 daughters; brother and nephew live in [place]. Widowed '94   Current outpatient prescriptions:atorvastatin (LIPITOR) 40 MG tablet, Take 1 tablet (40 mg total) by mouth daily., Disp: 90 tablet, Rfl: 3;  bethanechol (URECHOLINE) 25 MG tablet, Take 25 mg by mouth 2 (two) times daily.  ,  Disp: , Rfl: ;  CALAN SR 240 MG CR tablet, Take 1 tablet (240 mg total) by mouth at bedtime., Disp: 90 tablet, Rfl: 3 Calcium Carbonate-Vitamin D (CALCIUM 600+D) 600-400 MG-UNIT per tablet, Take 2 tablets by mouth daily.  , Disp: , Rfl: ;  Cholecalciferol (VITAMIN D3) 2000 UNITS TABS, Take 2,000 Units by mouth daily., Disp: , Rfl: ;  LANOXIN 125 MCG tablet, Take 1 tablet (125 mcg total) by mouth daily., Disp: 90 tablet, Rfl: 3;  loratadine (CLARITIN) 10 MG tablet, Take 10 mg by mouth daily., Disp: , Rfl:  Multiple Vitamins-Minerals (CENTRUM PO), Take by mouth.  , Disp: , Rfl: ;  VOLTAREN 1 % GEL, Apply 2 g topically daily. , Disp: , Rfl:   Allergies  Allergen Reactions  . Avelox (Moxifloxacin Hcl In Nacl) Nausea Only  . Clarithromycin Itching and Nausea Only  . Arimidex (Anastrozole) Rash  . Bactrim  Nausea Only and Rash  . Penicillins Nausea Only and Rash  . Sulfa Drugs Cross Reactors Nausea And Vomiting and Rash   ROS:  +URI symptoms--see HPI.  Denies headaches, chest pain, palpitations, dizziness, nausea, vomiting, abdominal pain, bowel changes (she has some mild constipation, controlled with prunes in diet), urinary complaints (improved on bethanecol from urologist), rashes, bleeding/bruising, depression or other concerns.  PHYSICAL EXAM: BP 122/76  Pulse 80  Temp(Src) 98.6 F (37 C) (Tympanic)  Ht 5\' 2"  (1.575 m)  Wt 123 lb (55.792 kg)  BMI 22.49 kg/m2 Pleasant elderly female, appearing younger than stated age, with occasional loose cough HEENT:  PERRL, EOMI, conjunctiva clear.  TM's and EAC's normal.  OP clear.  Nasal mucosa mildly edematous with clear mucus. No erythema.  Sinuses nontender. Neck: no lymphadenopathy, thyromegaly, carotid bruit or mass Heart: regular rate and rhythm without murmur or ectopy Lungs: initially had ronchi, which cleared with cough (whitish, very light yellow, thin phlegm).  Lungs were then clear, with good air movement, and no rales or wheezing Abdomen: soft, nontender, no mass.  No organomegaly Extremities: no edema, 2+ pulses Psych: normal mood, affect, hygiene and grooming Neuro: alert and oriented.  Cranial nerves grossly intact. Normal gait, strength, sensation  ASSESSMENT/PLAN:  Mixed hyperlipidemia - Plan: Lipid panel, Hepatic function panel  Palpitations - controlled with lanoxin - Plan: TSH, LANOXIN 125 MCG tablet, CALAN SR 240 MG CR tablet  Essential hypertension, benign - controlled - Plan: CALAN SR 240 MG CR tablet  Screening for diabetes mellitus - Plan: Glucose, random  Encounter for long-term (current) use of other medications - Plan: Hepatic function panel, Digoxin level  Acute upper respiratory infections of unspecified site - starting to improve.  continue supportive management.    Breast cancer--due for c-met and CBC, and  visit with oncology in December.  Since she doesn't fast for those labs, will do glucose today.

## 2012-10-01 ENCOUNTER — Encounter: Payer: Self-pay | Admitting: Family Medicine

## 2012-10-01 DIAGNOSIS — L821 Other seborrheic keratosis: Secondary | ICD-10-CM | POA: Diagnosis not present

## 2012-10-01 DIAGNOSIS — L439 Lichen planus, unspecified: Secondary | ICD-10-CM | POA: Diagnosis not present

## 2012-10-01 LAB — HEPATIC FUNCTION PANEL
AST: 17 U/L (ref 0–37)
Albumin: 4.3 g/dL (ref 3.5–5.2)
Alkaline Phosphatase: 93 U/L (ref 39–117)
Total Bilirubin: 0.8 mg/dL (ref 0.3–1.2)
Total Protein: 7.1 g/dL (ref 6.0–8.3)

## 2012-10-01 LAB — LIPID PANEL
LDL Cholesterol: 114 mg/dL — ABNORMAL HIGH (ref 0–99)
Total CHOL/HDL Ratio: 4.1 Ratio
VLDL: 32 mg/dL (ref 0–40)

## 2012-10-14 ENCOUNTER — Telehealth: Payer: Self-pay | Admitting: *Deleted

## 2012-10-14 NOTE — Telephone Encounter (Signed)
R/S pt to see Norina Buzzard, NP for 10/17/12 at 1000.  Confirmed new appt date and time.  Pt denies further needs at this time.

## 2012-10-17 ENCOUNTER — Encounter: Payer: Self-pay | Admitting: Family

## 2012-10-17 ENCOUNTER — Telehealth: Payer: Self-pay | Admitting: Oncology

## 2012-10-17 ENCOUNTER — Ambulatory Visit (HOSPITAL_BASED_OUTPATIENT_CLINIC_OR_DEPARTMENT_OTHER): Payer: Medicare Other | Admitting: Family

## 2012-10-17 VITALS — BP 165/87 | HR 76 | Temp 98.3°F | Resp 20 | Ht 62.0 in | Wt 128.5 lb

## 2012-10-17 DIAGNOSIS — Z853 Personal history of malignant neoplasm of breast: Secondary | ICD-10-CM

## 2012-10-17 DIAGNOSIS — M810 Age-related osteoporosis without current pathological fracture: Secondary | ICD-10-CM

## 2012-10-17 NOTE — Patient Instructions (Addendum)
Please contact us at [phone] if you have any questions or concerns.  Please continue to do well and enjoy life!!!  Get plenty of rest, drink plenty of water, exercise daily (walking), eat a balanced diet.  Take Caltrate 600 mg daily +D and Vitamin D3 1000 IUs daily.
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
## 2012-10-24 ENCOUNTER — Ambulatory Visit (HOSPITAL_BASED_OUTPATIENT_CLINIC_OR_DEPARTMENT_OTHER): Payer: Medicare Other

## 2012-10-24 VITALS — BP 149/78 | HR 77 | Temp 98.3°F

## 2012-10-24 DIAGNOSIS — M810 Age-related osteoporosis without current pathological fracture: Secondary | ICD-10-CM | POA: Diagnosis not present

## 2012-10-24 DIAGNOSIS — Z853 Personal history of malignant neoplasm of breast: Secondary | ICD-10-CM

## 2012-10-24 MED ORDER — DENOSUMAB 60 MG/ML ~~LOC~~ SOLN
60.0000 mg | Freq: Once | SUBCUTANEOUS | Status: AC
  Administered 2012-10-24: 60 mg via SUBCUTANEOUS
  Filled 2012-10-24: qty 1

## 2012-10-24 NOTE — Patient Instructions (Addendum)
Denosumab injection What is this medicine? DENOSUMAB slows bone breakdown. It is used to treat osteoporosis in women after menopause and in men. This medicine is also used to prevent bone fractures and other bone problems caused by cancer bone metastases. This medicine may be used for other purposes; ask your health care provider or pharmacist if you have questions. What should I tell my health care provider before I take this medicine? They need to know if you have any of these conditions: -dental disease -eczema -infection or history of infections -kidney disease or on dialysis -low blood calcium or vitamin D -malabsorption syndrome -scheduled to have surgery or tooth extraction -taking medicine that contains denosumab -thyroid or parathyroid disease -an unusual reaction to denosumab, other medicines, foods, dyes, or preservatives -pregnant or trying to get pregnant -breast-feeding How should I use this medicine? This medicine is for injection under the skin. It is given by a health care professional in a hospital or clinic setting. If you are getting Prolia, a special MedGuide will be given to you by the pharmacist with each prescription and refill. Be sure to read this information carefully each time. Talk to your pediatrician regarding the use of this medicine in children. Special care may be needed. Overdosage: If you think you've taken too much of this medicine contact a poison control center or emergency room at once. Overdosage: If you think you have taken too much of this medicine contact a poison control center or emergency room at once. NOTE: This medicine is only for you. Do not share this medicine with others. What if I miss a dose? It is important not to miss your dose. Call your doctor or health care professional if you are unable to keep an appointment. What may interact with this medicine? Do not take this medicine with any of the following medications: -other medicines  containing denosumab This medicine may also interact with the following medications: -medicines that suppress the immune system -medicines that treat cancer -steroid medicines like prednisone or cortisone This list may not describe all possible interactions. Give your health care provider a list of all the medicines, herbs, non-prescription drugs, or dietary supplements you use. Also tell them if you smoke, drink alcohol, or use illegal drugs. Some items may interact with your medicine. What should I watch for while using this medicine? Visit your doctor or health care professional for regular checks on your progress. Your doctor or health care professional may order blood tests and other tests to see how you are doing. Call your doctor or health care professional if you get a cold or other infection while receiving this medicine. Do not treat yourself. This medicine may decrease your body's ability to fight infection. You should make sure you get enough calcium and vitamin D while you are taking this medicine, unless your doctor tells you not to. Discuss the foods you eat and the vitamins you take with your health care professional. See your dentist regularly. Brush and floss your teeth as directed. Before you have any dental work done, tell your dentist you are receiving this medicine. What side effects may I notice from receiving this medicine? Side effects that you should report to your doctor or health care professional as soon as possible: -allergic reactions like skin rash, itching or hives, swelling of the face, lips, or tongue -breathing problems -chest pain -fast, irregular heartbeat -feeling faint or lightheaded, falls -fever, chills, or any other sign of infection -muscle spasms, tightening, or twitches -numbness   or tingling -skin blisters or bumps, or is dry, peels, or red -slow healing or unexplained pain in the mouth or jaw -unusual bleeding or bruising Side effects that  usually do not require medical attention (Report these to your doctor or health care professional if they continue or are bothersome.): -muscle pain -stomach upset, gas This list may not describe all possible side effects. Call your doctor for medical advice about side effects. You may report side effects to FDA at 1-800-FDA-1088. Where should I keep my medicine? This medicine is only given in a clinic, doctor's office, or other health care setting and will not be stored at home. NOTE: This sheet is a summary. It may not cover all possible information. If you have questions about this medicine, talk to your doctor, pharmacist, or health care provider.  2013, [employer]vier/[employer] Standard. (12/13/2010 3:40:41 PM)  

## 2012-10-24 NOTE — Progress Notes (Signed)
Consent form signed for Prolia injection.  This was her 1st injection.  She is taking Calcium with vitamin D.

## 2012-10-29 DIAGNOSIS — R05 Cough: Secondary | ICD-10-CM | POA: Diagnosis not present

## 2012-11-08 DIAGNOSIS — H3530 Unspecified macular degeneration: Secondary | ICD-10-CM | POA: Diagnosis not present

## 2012-11-08 DIAGNOSIS — H532 Diplopia: Secondary | ICD-10-CM | POA: Diagnosis not present

## 2012-11-08 DIAGNOSIS — H40019 Open angle with borderline findings, low risk, unspecified eye: Secondary | ICD-10-CM | POA: Diagnosis not present

## 2012-11-08 DIAGNOSIS — H2640 Unspecified secondary cataract: Secondary | ICD-10-CM | POA: Diagnosis not present

## 2012-11-21 DIAGNOSIS — M1710 Unilateral primary osteoarthritis, unspecified knee: Secondary | ICD-10-CM | POA: Diagnosis not present

## 2012-12-02 ENCOUNTER — Other Ambulatory Visit: Payer: Self-pay | Admitting: Orthopedic Surgery

## 2012-12-02 DIAGNOSIS — M1711 Unilateral primary osteoarthritis, right knee: Secondary | ICD-10-CM

## 2012-12-03 DIAGNOSIS — L439 Lichen planus, unspecified: Secondary | ICD-10-CM | POA: Diagnosis not present

## 2012-12-04 ENCOUNTER — Ambulatory Visit
Admission: RE | Admit: 2012-12-04 | Discharge: 2012-12-04 | Disposition: A | Discharge status: Home / Self Care | Payer: Medicare Other | Source: Ambulatory Visit | Attending: Orthopedic Surgery | Admitting: Orthopedic Surgery

## 2012-12-04 DIAGNOSIS — M1711 Unilateral primary osteoarthritis, right knee: Secondary | ICD-10-CM

## 2012-12-04 DIAGNOSIS — M169 Osteoarthritis of hip, unspecified: Secondary | ICD-10-CM | POA: Diagnosis not present

## 2012-12-04 DIAGNOSIS — M25579 Pain in unspecified ankle and joints of unspecified foot: Secondary | ICD-10-CM | POA: Diagnosis not present

## 2012-12-09 DIAGNOSIS — Z23 Encounter for immunization: Secondary | ICD-10-CM | POA: Diagnosis not present

## 2013-01-03 ENCOUNTER — Other Ambulatory Visit: Payer: Self-pay | Admitting: Orthopedic Surgery

## 2013-01-03 NOTE — H&P (Signed)
Mary [name]  DOB: 11/20/1926 Widowed / Language: English / Race: White Female  Date of Admission:  01/24/2013  Chief complaint:  Right knee pain  History of Present Illness The patient is a 79 year old female who comes in for a preoperative History and Physical. The patient is scheduled for a right unicompartmental replacement to be performed by Dr. Frank V. Aluisio, MD at [facility] on 01/24/2013. The patient is a 79 year old female who presented for follow up of their knee. The patient is being followed for their right knee pain and osteoarthritis. They are now 14 week(s) out from Synvisc series. Symptoms reported today include: pain, swelling, aching, stiffness and difficulty ambulating. The patient feels that they are doing poorly and report their pain level to be moderate to severe. Current treatment includes: icing. The following medication has been used for pain control: antiinflammatory medication (Aleve). The patient has not gotten any relief of their symptoms with viscosupplementation. The knee is getting progressively worse over time. The Synvisc did not provide a tremendous amount of benefit. Cortisone tended to do better for her but was only temporary. She is ready to proceed with surgery for a more permanent solution. They have been treated conservatively in the past for the above stated problem and despite conservative measures, they continue to have progressive pain and severe functional limitations and dysfunction. They have failed non-operative management including home exercise, medications, and injections. It is felt that they would benefit from undergoing unicompartmental replacement. Risks and benefits of the procedure have been discussed with the patient and they elect to proceed with surgery. There are no active contraindications to surgery such as ongoing infection or rapidly progressive neurological disease.   Problem List Primary osteoarthritis  of one knee, right (715.16) Osteoarthritis, Hip (715.35)    Allergies Macrobid *URINARY ANTI-INFECTIVES* Nitrofurantoin *URINARY ANTI-INFECTIVES* Sulfanomides Avelox *FLUOROQUINOLONES* Penicillin VK *PENICILLINS* Biaxin *MACROLIDES* Bactrim *ANTI-INFECTIVE AGENTS - MISC.*    Family History Cerebrovascular Accident. First Degree Relatives. father Hypertension. mother    Social History Pain Contract. no Exercise. Exercises rarely; does running / walking Children. 3 Tobacco use. Never smoker. never smoker Living situation. live alone Illicit drug use. no Marital status. widowed Drug/Alcohol Rehab (Currently). no Alcohol use. current drinker; only occasionally per week Current work status. retired Drug/Alcohol Rehab (Previously). no Plan: Home Directives: Living Will, Healthcare POA   Past Surgical History Breast Mass; Local Excision. right Carpal Tunnel Repair. right Colon Polyp Removal - Colonoscopy Mastectomy. right   Medical History Hypercholesterolemia Cardiac Arrhythmia High blood pressure Breast cancer. Right-sided    Review of Systems General:Not Present- Chills, Fever, Night Sweats, Fatigue, Weight Gain, Weight Loss and Memory Loss. Skin:Not Present- Hives, Itching, Rash, Eczema and Lesions. HEENT:Not Present- Tinnitus, Headache, Double Vision, Visual Loss, Hearing Loss and Dentures. Respiratory:Not Present- Shortness of breath with exertion, Shortness of breath at rest, Allergies, Coughing up blood and Chronic Cough. Cardiovascular:Present- Palpitations. Not Present- Chest Pain, Racing/skipping heartbeats, Difficulty Breathing Lying Down, Murmur and Swelling. Gastrointestinal:Not Present- Bloody Stool, Heartburn, Abdominal Pain, Vomiting, Nausea, Constipation, Diarrhea, Difficulty Swallowing, Jaundice and Loss of appetitie. Female Genitourinary:Present- Urinating at Night. Not Present- Blood in Urine, Urinary  frequency, Weak urinary stream, Discharge, Flank Pain, Incontinence, Painful Urination, Urgency and Urinary Retention. Musculoskeletal:Present- Joint Pain. Not Present- Muscle Weakness, Muscle Pain, Joint Swelling, Back Pain, Morning Stiffness and Spasms. Neurological:Not Present- Tremor, Dizziness, Blackout spells, Paralysis, Difficulty with balance and Weakness. Psychiatric:Not Present- Insomnia.    Vitals Weight: 128 lb Height: 62.5 in   Body Surface Area: 1.6 m Body Mass Index: 23.04 kg/m Pulse: 68 (Regular) Resp.: 14 (Unlabored) BP: 132/86 (Sitting, Left Arm, Standard)     Physical Exam The physical exam findings are as follows:   General Mental Status - Alert, cooperative and good historian. General Appearance- pleasant. Not in acute distress. Orientation- Oriented X3. Build & Nutrition- Well nourished and Well developed.   Head and Neck Head- normocephalic, atraumatic . Neck Global Assessment- supple. no bruit auscultated on the right and no bruit auscultated on the left.   Eye Pupil- Bilateral- Regular and Round. Motion- Bilateral- EOMI.   Chest and Lung Exam Auscultation: Breath sounds:- clear at anterior chest wall and - clear at posterior chest wall. Adventitious sounds:- No Adventitious sounds.   Cardiovascular Auscultation:Rhythm- Regular rate and rhythm. Heart Sounds- S1 WNL and S2 WNL. Murmurs & Other Heart Sounds:Auscultation of the heart reveals - No Murmurs.   Abdomen Palpation/Percussion:Tenderness- Abdomen is non-tender to palpation. Rigidity (guarding)- Abdomen is soft. Auscultation:Auscultation of the abdomen reveals - Bowel sounds normal.   Female Genitourinary  Not done, not pertinent to present illness  Musculoskeletal On exam, she's alert and oriented, in no apparent distress. Her right knee shows no effusion. Range is about 5-120. There is marked crepitus on ROM. She's tender medial  greater than lateral with no instability noted.  Radiographs - She has bone on bone medial    Assessment & Plan Primary osteoarthritis of one knee, right (715.16) Impression: Right Knee  Note: Plan is for a Right Knee Unicompartmental Replacement by Dr. Aluisio.  Plan is to go home.  PCP - Dr. Eve Knapp - Patient has been seen preoperatively and felt to be stable for surgery.  The patient will not receive TXA (tranexamic acid) due to: Breast Cancer  Please note, do not use the right arm for blood pressures or blood draws due to previous right-sided mastectomy.  Signed electronically by Cela Newcom L Edilia Ghuman, III PA-C 

## 2013-01-08 DIAGNOSIS — Z8262 Family history of osteoporosis: Secondary | ICD-10-CM | POA: Diagnosis not present

## 2013-01-08 DIAGNOSIS — Z1231 Encounter for screening mammogram for malignant neoplasm of breast: Secondary | ICD-10-CM | POA: Diagnosis not present

## 2013-01-08 DIAGNOSIS — Z853 Personal history of malignant neoplasm of breast: Secondary | ICD-10-CM | POA: Diagnosis not present

## 2013-01-09 ENCOUNTER — Encounter: Payer: Self-pay | Admitting: Internal Medicine

## 2013-01-09 NOTE — Progress Notes (Signed)
Need orders when able please - pt coming for preop THURS 01/16/13 - thank you

## 2013-01-12 ENCOUNTER — Other Ambulatory Visit: Payer: Self-pay | Admitting: Orthopedic Surgery

## 2013-01-13 ENCOUNTER — Encounter (HOSPITAL_COMMUNITY): Payer: Self-pay | Admitting: Pharmacy Technician

## 2013-01-14 DIAGNOSIS — L821 Other seborrheic keratosis: Secondary | ICD-10-CM | POA: Diagnosis not present

## 2013-01-14 DIAGNOSIS — Z85828 Personal history of other malignant neoplasm of skin: Secondary | ICD-10-CM | POA: Diagnosis not present

## 2013-01-14 DIAGNOSIS — L439 Lichen planus, unspecified: Secondary | ICD-10-CM | POA: Diagnosis not present

## 2013-01-14 DIAGNOSIS — I781 Nevus, non-neoplastic: Secondary | ICD-10-CM | POA: Diagnosis not present

## 2013-01-15 ENCOUNTER — Other Ambulatory Visit (HOSPITAL_COMMUNITY): Payer: Self-pay | Admitting: Orthopedic Surgery

## 2013-01-15 NOTE — Patient Instructions (Addendum)
20 Mary [name]  01/15/2013   Your procedure is scheduled on:  01/24/13  FRIDAY  Report to [facility] at      12:15 PM  Call this number if you have problems the morning of surgery: [phone]       Remember:   Do not eat food :After Midnight. Thursday NIGHT-- MAY HAVE CLEAR LIQUIDS Friday MORNING UNTIL  0845AM,  THEN NOTHING BY MOUTH   Take these medicines the morning of surgery with A SIP OF WATER:  Lanoxin, Calan, Bethanechol .  Contacts, dentures or partial plates can not be worn to surgery  Leave suitcase in the car. After surgery it may be brought to your room.  For patients admitted to the hospital, checkout time is 11:00 AM day of  discharge.             SPECIAL INSTRUCTIONS- SEE [facility] PREPARING FOR SURGERY INSTRUCTION SHEET-     DO NOT WEAR JEWELRY, LOTIONS, POWDERS, OR PERFUMES.  WOMEN-- DO NOT SHAVE LEGS OR UNDERARMS FOR 12 HOURS BEFORE SHOWERS. MEN MAY SHAVE FACE.  Patients discharged the day of surgery will not be allowed to drive home. IF going home the day of surgery, you must have a driver and someone to stay with you for the first 24 hours  Name and phone number of your driver:      admission                                                                  Please read over the following fact sheets that you were given: MRSA Information, Incentive Spirometry Sheet, Blood Transfusion Sheet  Information                                                                                   Mary [name]  PST 336  [phone]                 FAILURE TO FOLLOW THESE INSTRUCTIONS MAY RESULT IN  CANCELLATION   OF YOUR SURGERY                                                  Patient Signature _____________________________

## 2013-01-15 NOTE — Progress Notes (Signed)
Clearance Dr Lynelle Doctor  chart

## 2013-01-16 ENCOUNTER — Encounter (HOSPITAL_COMMUNITY)
Admission: RE | Admit: 2013-01-16 | Discharge: 2013-01-16 | Disposition: A | Discharge status: Home / Self Care | Payer: Medicare Other | Source: Ambulatory Visit | Attending: Orthopedic Surgery | Admitting: Orthopedic Surgery

## 2013-01-16 ENCOUNTER — Ambulatory Visit (HOSPITAL_COMMUNITY)
Admission: RE | Admit: 2013-01-16 | Discharge: 2013-01-16 | Disposition: A | Discharge status: Home / Self Care | Payer: Medicare Other | Source: Ambulatory Visit | Attending: Orthopedic Surgery | Admitting: Orthopedic Surgery

## 2013-01-16 ENCOUNTER — Encounter (HOSPITAL_COMMUNITY): Payer: Self-pay

## 2013-01-16 DIAGNOSIS — Z01818 Encounter for other preprocedural examination: Secondary | ICD-10-CM | POA: Diagnosis not present

## 2013-01-16 DIAGNOSIS — Z01812 Encounter for preprocedural laboratory examination: Secondary | ICD-10-CM | POA: Insufficient documentation

## 2013-01-16 DIAGNOSIS — M5380 Other specified dorsopathies, site unspecified: Secondary | ICD-10-CM | POA: Insufficient documentation

## 2013-01-16 DIAGNOSIS — Z01810 Encounter for preprocedural cardiovascular examination: Secondary | ICD-10-CM | POA: Insufficient documentation

## 2013-01-16 DIAGNOSIS — M1710 Unilateral primary osteoarthritis, unspecified knee: Secondary | ICD-10-CM | POA: Diagnosis not present

## 2013-01-16 LAB — URINALYSIS, ROUTINE W REFLEX MICROSCOPIC
Bilirubin Urine: NEGATIVE
Hgb urine dipstick: NEGATIVE
Protein, ur: NEGATIVE mg/dL
Specific Gravity, Urine: 1.014 (ref 1.005–1.030)
Urobilinogen, UA: 0.2 mg/dL (ref 0.0–1.0)

## 2013-01-16 LAB — COMPREHENSIVE METABOLIC PANEL
Albumin: 4.4 g/dL (ref 3.5–5.2)
Alkaline Phosphatase: 77 U/L (ref 39–117)
BUN: 15 mg/dL (ref 6–23)
CO2: 29 mEq/L (ref 19–32)
Chloride: 102 mEq/L (ref 96–112)
GFR calc Af Amer: 87 mL/min — ABNORMAL LOW (ref >90)
GFR calc non Af Amer: 75 mL/min — ABNORMAL LOW (ref >90)
Glucose, Bld: 88 mg/dL (ref 70–99)
Potassium: 4.1 mEq/L (ref 3.5–5.1)
Total Bilirubin: 0.5 mg/dL (ref 0.3–1.2)

## 2013-01-16 LAB — SURGICAL PCR SCREEN: Staphylococcus aureus: NEGATIVE

## 2013-01-16 LAB — CBC
HCT: 41.9 % (ref 36.0–46.0)
Hemoglobin: 13.9 g/dL (ref 12.0–15.0)
MCHC: 33.2 g/dL (ref 30.0–36.0)
MCV: 87.5 fL (ref 78.0–100.0)
Platelets: 244 10*3/uL (ref 150–400)
WBC: 6.9 10*3/uL (ref 4.0–10.5)

## 2013-01-16 LAB — APTT: aPTT: 29 seconds (ref 24–37)

## 2013-01-16 LAB — PROTIME-INR: Prothrombin Time: 13 seconds (ref 11.6–15.2)

## 2013-01-16 LAB — ABO/RH: ABO/RH(D): A NEG

## 2013-01-17 DIAGNOSIS — N3644 Muscular disorders of urethra: Secondary | ICD-10-CM | POA: Diagnosis not present

## 2013-01-17 DIAGNOSIS — M6281 Muscle weakness (generalized): Secondary | ICD-10-CM | POA: Diagnosis not present

## 2013-01-17 DIAGNOSIS — N312 Flaccid neuropathic bladder, not elsewhere classified: Secondary | ICD-10-CM | POA: Diagnosis not present

## 2013-01-17 DIAGNOSIS — R339 Retention of urine, unspecified: Secondary | ICD-10-CM | POA: Diagnosis not present

## 2013-01-22 ENCOUNTER — Telehealth: Payer: Self-pay | Admitting: *Deleted

## 2013-01-22 NOTE — Telephone Encounter (Signed)
noted 

## 2013-01-22 NOTE — Telephone Encounter (Signed)
Called patient and went over DEXA results with her. She stated that she really doesn't want to restart Fosamax, she is having surgery Friday. I asked about maybe starting after the surgery. She states that she takes her vitamin D and calcium everyday. I told her she could schedule an appointment with you to discuss other options. She said she will call back in the future. Just an FYI.

## 2013-01-22 NOTE — Telephone Encounter (Signed)
Left message for patient to return my call to go over DEXA results.

## 2013-01-24 ENCOUNTER — Encounter (HOSPITAL_COMMUNITY)
Admission: RE | Disposition: A | Discharge status: Home Health | Payer: Self-pay | Source: Ambulatory Visit | Attending: Orthopedic Surgery

## 2013-01-24 ENCOUNTER — Inpatient Hospital Stay (HOSPITAL_COMMUNITY)
Admission: RE | Admit: 2013-01-24 | Discharge: 2013-01-26 | DRG: 470 | Disposition: A | Discharge status: Home Health | Payer: Medicare Other | Source: Ambulatory Visit | Attending: Orthopedic Surgery | Admitting: Orthopedic Surgery

## 2013-01-24 ENCOUNTER — Inpatient Hospital Stay (HOSPITAL_COMMUNITY): Payer: Medicare Other | Admitting: Anesthesiology

## 2013-01-24 ENCOUNTER — Encounter (HOSPITAL_COMMUNITY): Payer: Self-pay | Admitting: *Deleted

## 2013-01-24 ENCOUNTER — Encounter (HOSPITAL_COMMUNITY): Payer: Medicare Other | Admitting: Anesthesiology

## 2013-01-24 DIAGNOSIS — Z860100 Personal history of colon polyps, unspecified: Secondary | ICD-10-CM

## 2013-01-24 DIAGNOSIS — Z880 Allergy status to penicillin: Secondary | ICD-10-CM

## 2013-01-24 DIAGNOSIS — M1710 Unilateral primary osteoarthritis, unspecified knee: Principal | ICD-10-CM | POA: Diagnosis present

## 2013-01-24 DIAGNOSIS — Z9010 Acquired absence of unspecified breast and nipple: Secondary | ICD-10-CM

## 2013-01-24 DIAGNOSIS — E7800 Pure hypercholesterolemia, unspecified: Secondary | ICD-10-CM | POA: Diagnosis not present

## 2013-01-24 DIAGNOSIS — I10 Essential (primary) hypertension: Secondary | ICD-10-CM | POA: Diagnosis present

## 2013-01-24 DIAGNOSIS — C50919 Malignant neoplasm of unspecified site of unspecified female breast: Secondary | ICD-10-CM | POA: Diagnosis present

## 2013-01-24 DIAGNOSIS — E785 Hyperlipidemia, unspecified: Secondary | ICD-10-CM | POA: Diagnosis present

## 2013-01-24 DIAGNOSIS — IMO0002 Reserved for concepts with insufficient information to code with codable children: Secondary | ICD-10-CM | POA: Diagnosis not present

## 2013-01-24 HISTORY — PX: PARTIAL KNEE ARTHROPLASTY: SHX2174

## 2013-01-24 LAB — TYPE AND SCREEN

## 2013-01-24 SURGERY — ARTHROPLASTY, KNEE, UNICOMPARTMENTAL
Anesthesia: Spinal | Site: Knee | Laterality: Right | Wound class: Clean

## 2013-01-24 MED ORDER — DIPHENHYDRAMINE HCL 12.5 MG/5ML PO ELIX: 12.5000 mg | ORAL_SOLUTION | ORAL | Status: DC | PRN

## 2013-01-24 MED ORDER — BISACODYL 10 MG RE SUPP: 10.0000 mg | Freq: Every day | RECTAL | Status: DC | PRN

## 2013-01-24 MED ORDER — POLYETHYLENE GLYCOL 3350 17 G PO PACK
17.0000 g | PACK | Freq: Every day | ORAL | Status: DC
  Administered 2013-01-25 – 2013-01-26 (×2): 17 g via ORAL

## 2013-01-24 MED ORDER — CEFAZOLIN SODIUM 1-5 GM-% IV SOLN
1.0000 g | Freq: Four times a day (QID) | INTRAVENOUS | Status: AC
  Administered 2013-01-24 – 2013-01-25 (×2): 1 g via INTRAVENOUS
  Filled 2013-01-24 (×2): qty 50

## 2013-01-24 MED ORDER — MIDAZOLAM HCL 5 MG/5ML IJ SOLN
INTRAMUSCULAR | Status: DC | PRN
  Administered 2013-01-24 (×4): 0.5 mg via INTRAVENOUS

## 2013-01-24 MED ORDER — VERAPAMIL HCL ER 240 MG PO TBCR
240.0000 mg | EXTENDED_RELEASE_TABLET | Freq: Every morning | ORAL | Status: DC
  Administered 2013-01-25 – 2013-01-26 (×2): 240 mg via ORAL
  Filled 2013-01-24 (×2): qty 1

## 2013-01-24 MED ORDER — OXYCODONE HCL 5 MG PO TABS
5.0000 mg | ORAL_TABLET | ORAL | Status: DC | PRN
  Administered 2013-01-24 – 2013-01-26 (×5): 5 mg via ORAL
  Filled 2013-01-24 (×5): qty 1

## 2013-01-24 MED ORDER — CEFAZOLIN SODIUM-DEXTROSE 2-3 GM-% IV SOLR
INTRAVENOUS | Status: AC
  Filled 2013-01-24: qty 50

## 2013-01-24 MED ORDER — LORATADINE 10 MG PO TABS
10.0000 mg | ORAL_TABLET | Freq: Every day | ORAL | Status: DC
  Administered 2013-01-24 – 2013-01-26 (×3): 10 mg via ORAL
  Filled 2013-01-24 (×3): qty 1

## 2013-01-24 MED ORDER — FENTANYL CITRATE 0.05 MG/ML IJ SOLN
INTRAMUSCULAR | Status: DC | PRN
  Administered 2013-01-24: 50 ug via INTRAVENOUS

## 2013-01-24 MED ORDER — ACETAMINOPHEN 325 MG PO TABS: 650.0000 mg | ORAL_TABLET | Freq: Four times a day (QID) | ORAL | Status: DC | PRN

## 2013-01-24 MED ORDER — METOCLOPRAMIDE HCL 10 MG PO TABS: 5.0000 mg | ORAL_TABLET | Freq: Three times a day (TID) | ORAL | Status: DC | PRN

## 2013-01-24 MED ORDER — LACTATED RINGERS IV SOLN
INTRAVENOUS | Status: DC
  Administered 2013-01-24: 1000 mL via INTRAVENOUS

## 2013-01-24 MED ORDER — SODIUM CHLORIDE 0.9 % IV SOLN
INTRAVENOUS | Status: DC
  Administered 2013-01-24: 22:00:00 via INTRAVENOUS

## 2013-01-24 MED ORDER — ATORVASTATIN CALCIUM 40 MG PO TABS
40.0000 mg | ORAL_TABLET | Freq: Every evening | ORAL | Status: DC
  Administered 2013-01-25: 40 mg via ORAL
  Filled 2013-01-24 (×3): qty 1

## 2013-01-24 MED ORDER — DEXAMETHASONE SODIUM PHOSPHATE 10 MG/ML IJ SOLN
10.0000 mg | Freq: Three times a day (TID) | INTRAMUSCULAR | Status: AC
  Filled 2013-01-24 (×3): qty 1

## 2013-01-24 MED ORDER — MEPERIDINE HCL 50 MG/ML IJ SOLN: 6.2500 mg | INTRAMUSCULAR | Status: DC | PRN

## 2013-01-24 MED ORDER — BUPIVACAINE LIPOSOME 1.3 % IJ SUSP
20.0000 mL | Freq: Once | INTRAMUSCULAR | Status: DC
  Filled 2013-01-24: qty 20

## 2013-01-24 MED ORDER — KETOROLAC TROMETHAMINE 15 MG/ML IJ SOLN: 7.5000 mg | Freq: Four times a day (QID) | INTRAMUSCULAR | Status: AC | PRN

## 2013-01-24 MED ORDER — MENTHOL 3 MG MT LOZG: 1.0000 | LOZENGE | OROMUCOSAL | Status: DC | PRN

## 2013-01-24 MED ORDER — DOCUSATE SODIUM 100 MG PO CAPS
100.0000 mg | ORAL_CAPSULE | Freq: Two times a day (BID) | ORAL | Status: DC
  Administered 2013-01-24 – 2013-01-26 (×4): 100 mg via ORAL

## 2013-01-24 MED ORDER — DEXTROSE 5 % IV SOLN
500.0000 mg | Freq: Four times a day (QID) | INTRAVENOUS | Status: DC | PRN
  Filled 2013-01-24: qty 5

## 2013-01-24 MED ORDER — EPHEDRINE SULFATE 50 MG/ML IJ SOLN
INTRAMUSCULAR | Status: DC | PRN
  Administered 2013-01-24 (×2): 5 mg via INTRAVENOUS

## 2013-01-24 MED ORDER — BUPIVACAINE HCL 0.25 % IJ SOLN
INTRAMUSCULAR | Status: DC | PRN
  Administered 2013-01-24: 20 mL

## 2013-01-24 MED ORDER — SODIUM CHLORIDE 0.9 % IV SOLN
INTRAVENOUS | Status: DC | PRN
  Administered 2013-01-24: 20 mL via INTRAMUSCULAR

## 2013-01-24 MED ORDER — RIVAROXABAN 10 MG PO TABS
10.0000 mg | ORAL_TABLET | Freq: Every day | ORAL | Status: DC
  Administered 2013-01-25 – 2013-01-26 (×2): 10 mg via ORAL
  Filled 2013-01-24 (×3): qty 1

## 2013-01-24 MED ORDER — OXYCODONE HCL 5 MG/5ML PO SOLN
5.0000 mg | Freq: Once | ORAL | Status: DC | PRN
  Filled 2013-01-24: qty 5

## 2013-01-24 MED ORDER — DIGOXIN 125 MCG PO TABS
0.1250 mg | ORAL_TABLET | Freq: Every morning | ORAL | Status: DC
Start: 2013-01-25 — End: 2013-01-26
  Administered 2013-01-25 – 2013-01-26 (×2): 0.125 mg via ORAL
  Filled 2013-01-24 (×2): qty 1

## 2013-01-24 MED ORDER — MORPHINE SULFATE 2 MG/ML IJ SOLN: 1.0000 mg | INTRAMUSCULAR | Status: DC | PRN

## 2013-01-24 MED ORDER — METOCLOPRAMIDE HCL 5 MG/ML IJ SOLN: 5.0000 mg | Freq: Three times a day (TID) | INTRAMUSCULAR | Status: DC | PRN

## 2013-01-24 MED ORDER — PROMETHAZINE HCL 25 MG/ML IJ SOLN: 6.2500 mg | INTRAMUSCULAR | Status: DC | PRN

## 2013-01-24 MED ORDER — POLYETHYLENE GLYCOL 3350 17 G PO PACK: 17.0000 g | PACK | Freq: Every day | ORAL | Status: DC | PRN

## 2013-01-24 MED ORDER — FLEET ENEMA 7-19 GM/118ML RE ENEM: 1.0000 | ENEMA | Freq: Once | RECTAL | Status: AC | PRN

## 2013-01-24 MED ORDER — ONDANSETRON HCL 4 MG PO TABS: 4.0000 mg | ORAL_TABLET | Freq: Four times a day (QID) | ORAL | Status: DC | PRN

## 2013-01-24 MED ORDER — BETHANECHOL CHLORIDE 25 MG PO TABS
25.0000 mg | ORAL_TABLET | Freq: Two times a day (BID) | ORAL | Status: DC
  Administered 2013-01-25 – 2013-01-26 (×3): 25 mg via ORAL
  Filled 2013-01-24 (×5): qty 1

## 2013-01-24 MED ORDER — ACETAMINOPHEN 650 MG RE SUPP: 650.0000 mg | Freq: Four times a day (QID) | RECTAL | Status: DC | PRN

## 2013-01-24 MED ORDER — BUPIVACAINE LIPOSOME 1.3 % IJ SUSP
INTRAMUSCULAR | Status: DC | PRN
  Administered 2013-01-24: 20 mL

## 2013-01-24 MED ORDER — CEFAZOLIN SODIUM-DEXTROSE 2-3 GM-% IV SOLR
2.0000 g | INTRAVENOUS | Status: AC
  Administered 2013-01-24: 2 g via INTRAVENOUS

## 2013-01-24 MED ORDER — LORATADINE 10 MG PO TABS: 10.0000 mg | ORAL_TABLET | Freq: Every day | ORAL | Status: DC

## 2013-01-24 MED ORDER — ACETAMINOPHEN 500 MG PO TABS
1000.0000 mg | ORAL_TABLET | Freq: Four times a day (QID) | ORAL | Status: AC
  Administered 2013-01-24 – 2013-01-25 (×3): 1000 mg via ORAL
  Filled 2013-01-24 (×4): qty 2

## 2013-01-24 MED ORDER — ACETAMINOPHEN 500 MG PO TABS
1000.0000 mg | ORAL_TABLET | Freq: Once | ORAL | Status: AC
  Administered 2013-01-24: 1000 mg via ORAL
  Filled 2013-01-24: qty 2

## 2013-01-24 MED ORDER — LACTATED RINGERS IV SOLN
INTRAVENOUS | Status: DC | PRN
  Administered 2013-01-24 (×2): via INTRAVENOUS

## 2013-01-24 MED ORDER — DEXAMETHASONE SODIUM PHOSPHATE 4 MG/ML IJ SOLN
INTRAMUSCULAR | Status: DC | PRN
  Administered 2013-01-24: 10 mg via INTRAVENOUS

## 2013-01-24 MED ORDER — DEXAMETHASONE SODIUM PHOSPHATE 10 MG/ML IJ SOLN: 10.0000 mg | Freq: Once | INTRAMUSCULAR | Status: DC

## 2013-01-24 MED ORDER — SODIUM CHLORIDE 0.9 % IV SOLN: INTRAVENOUS | Status: DC

## 2013-01-24 MED ORDER — ONDANSETRON HCL 4 MG/2ML IJ SOLN: 4.0000 mg | Freq: Four times a day (QID) | INTRAMUSCULAR | Status: DC | PRN

## 2013-01-24 MED ORDER — SODIUM CHLORIDE 0.9 % IJ SOLN
INTRAMUSCULAR | Status: AC
  Filled 2013-01-24: qty 50

## 2013-01-24 MED ORDER — DEXAMETHASONE 6 MG PO TABS
10.0000 mg | ORAL_TABLET | Freq: Three times a day (TID) | ORAL | Status: AC
  Administered 2013-01-24 – 2013-01-25 (×3): 10 mg via ORAL
  Filled 2013-01-24 (×3): qty 1

## 2013-01-24 MED ORDER — ONDANSETRON HCL 4 MG/2ML IJ SOLN
INTRAMUSCULAR | Status: DC | PRN
  Administered 2013-01-24 (×2): 2 mg via INTRAVENOUS

## 2013-01-24 MED ORDER — METHOCARBAMOL 500 MG PO TABS
500.0000 mg | ORAL_TABLET | Freq: Four times a day (QID) | ORAL | Status: DC | PRN
  Administered 2013-01-24 – 2013-01-25 (×3): 500 mg via ORAL
  Filled 2013-01-24 (×3): qty 1

## 2013-01-24 MED ORDER — TRAMADOL HCL 50 MG PO TABS
50.0000 mg | ORAL_TABLET | Freq: Four times a day (QID) | ORAL | Status: DC | PRN
  Administered 2013-01-24: 50 mg via ORAL
  Filled 2013-01-24: qty 1

## 2013-01-24 MED ORDER — HYDROMORPHONE HCL PF 1 MG/ML IJ SOLN: INTRAMUSCULAR | Status: DC | PRN

## 2013-01-24 MED ORDER — PROPOFOL INFUSION 10 MG/ML OPTIME
INTRAVENOUS | Status: DC | PRN
  Administered 2013-01-24: 120 ug/kg/min via INTRAVENOUS

## 2013-01-24 MED ORDER — BUPIVACAINE HCL (PF) 0.25 % IJ SOLN
INTRAMUSCULAR | Status: AC
  Filled 2013-01-24: qty 30

## 2013-01-24 MED ORDER — PHENOL 1.4 % MT LIQD: 1.0000 | OROMUCOSAL | Status: DC | PRN

## 2013-01-24 MED ORDER — HYDROMORPHONE HCL PF 1 MG/ML IJ SOLN: 0.2500 mg | INTRAMUSCULAR | Status: DC | PRN

## 2013-01-24 MED ORDER — OXYCODONE HCL 5 MG PO TABS: 5.0000 mg | ORAL_TABLET | Freq: Once | ORAL | Status: DC | PRN

## 2013-01-24 SURGICAL SUPPLY — 56 items
BAG SPEC THK2 15X12 ZIP CLS (MISCELLANEOUS) ×1
BAG ZIPLOCK 12X15 (MISCELLANEOUS) ×2 IMPLANT
BANDAGE ELASTIC 6 VELCRO ST LF (GAUZE/BANDAGES/DRESSINGS) ×2 IMPLANT
BANDAGE ESMARK 6X9 LF (GAUZE/BANDAGES/DRESSINGS) ×1 IMPLANT
BLADE SAW RECIPROCATING 77.5 (BLADE) ×2 IMPLANT
BLADE SAW SGTL 13.0X1.19X90.0M (BLADE) ×2 IMPLANT
BNDG CMPR 9X6 STRL LF SNTH (GAUZE/BANDAGES/DRESSINGS) ×1
BNDG ESMARK 6X9 LF (GAUZE/BANDAGES/DRESSINGS) ×2
BOWL SMART MIX CTS (DISPOSABLE) ×2 IMPLANT
BUR OVAL CARBIDE 4.0 (BURR) ×2 IMPLANT
CAP KNEE UNI RIGHT MEDIAL ×1 IMPLANT
CEMENT HV SMART SET (Cement) ×3 IMPLANT
CLOSURE STERI-STRIP 1/4X4 (GAUZE/BANDAGES/DRESSINGS) ×1 IMPLANT
CLOTH BEACON ORANGE TIMEOUT ST (SAFETY) ×2 IMPLANT
CUFF TOURN SGL QUICK 34 (TOURNIQUET CUFF) ×2
CUFF TRNQT CYL 34X4X40X1 (TOURNIQUET CUFF) ×1 IMPLANT
DEPRESSOR TONGUE BLADE STERILE (MISCELLANEOUS) ×2 IMPLANT
DRAPE EXTREMITY T 121X128X90 (DRAPE) ×2 IMPLANT
DRAPE POUCH INSTRU U-SHP 10X18 (DRAPES) ×2 IMPLANT
DRSG ADAPTIC 3X8 NADH LF (GAUZE/BANDAGES/DRESSINGS) ×4 IMPLANT
DRSG PAD ABDOMINAL 8X10 ST (GAUZE/BANDAGES/DRESSINGS) ×3 IMPLANT
DURAPREP 26ML APPLICATOR (WOUND CARE) ×2 IMPLANT
ELECT REM PT RETURN 9FT ADLT (ELECTROSURGICAL) ×2
ELECTRODE REM PT RTRN 9FT ADLT (ELECTROSURGICAL) ×1 IMPLANT
EVACUATOR 1/8 PVC DRAIN (DRAIN) ×2 IMPLANT
FACESHIELD LNG OPTICON STERILE (SAFETY) ×10 IMPLANT
GLOVE BIO SURGEON STRL SZ7.5 (GLOVE) ×2 IMPLANT
GLOVE BIO SURGEON STRL SZ8 (GLOVE) ×2 IMPLANT
GLOVE BIOGEL PI IND STRL 8 (GLOVE) ×2 IMPLANT
GLOVE BIOGEL PI INDICATOR 8 (GLOVE) ×2
GOWN PREVENTION PLUS LG XLONG (DISPOSABLE) ×2 IMPLANT
GOWN STRL REIN XL XLG (GOWN DISPOSABLE) ×2 IMPLANT
HANDPIECE INTERPULSE COAX TIP (DISPOSABLE) ×2
IMMOBILIZER KNEE 20 (SOFTGOODS) ×2
IMMOBILIZER KNEE 20 THIGH 36 (SOFTGOODS) IMPLANT
KIT BASIN OR (CUSTOM PROCEDURE TRAY) ×2 IMPLANT
MANIFOLD NEPTUNE II (INSTRUMENTS) ×2 IMPLANT
NDL SAFETY ECLIPSE 18X1.5 (NEEDLE) ×2 IMPLANT
NEEDLE HYPO 18GX1.5 SHARP (NEEDLE) ×4
PACK GENERAL/GYN (CUSTOM PROCEDURE TRAY) ×2 IMPLANT
PACK TOTAL JOINT (CUSTOM PROCEDURE TRAY) ×2 IMPLANT
PADDING CAST COTTON 6X4 STRL (CAST SUPPLIES) ×4 IMPLANT
POSITIONER SURGICAL ARM (MISCELLANEOUS) ×2 IMPLANT
SET HNDPC FAN SPRY TIP SCT (DISPOSABLE) ×1 IMPLANT
SPONGE GAUZE 4X4 12PLY (GAUZE/BANDAGES/DRESSINGS) ×2 IMPLANT
STRIP CLOSURE SKIN 1/2X4 (GAUZE/BANDAGES/DRESSINGS) ×4 IMPLANT
SUCTION FRAZIER 12FR DISP (SUCTIONS) ×2 IMPLANT
SUT MNCRL AB 4-0 PS2 18 (SUTURE) ×2 IMPLANT
SUT VIC AB 2-0 CT1 27 (SUTURE) ×4
SUT VIC AB 2-0 CT1 TAPERPNT 27 (SUTURE) ×2 IMPLANT
SUT VLOC 180 0 24IN GS25 (SUTURE) ×2 IMPLANT
SYR 20CC LL (SYRINGE) ×2 IMPLANT
SYR 50ML LL SCALE MARK (SYRINGE) ×2 IMPLANT
TOWEL OR 17X26 10 PK STRL BLUE (TOWEL DISPOSABLE) ×4 IMPLANT
TOWER CARTRIDGE SMART MIX (DISPOSABLE) ×1 IMPLANT
TRAY FOLEY CATH 14FRSI W/METER (CATHETERS) ×2 IMPLANT

## 2013-01-24 NOTE — H&P (View-Only) (Signed)
Mary [name]  DOB: 1926/12/08 Widowed / Language: [place] / Race: White Female  Date of Admission:  01/24/2013  Chief complaint:  Right knee pain  History of Present Illness The patient is a 79 year old female who comes in for a preoperative History and Physical. The patient is scheduled for a right unicompartmental replacement to be performed by Dr. Gus Rankin. Aluisio, MD at [facility] on 01/24/2013. The patient is a 79 year old female who presented for follow up of their knee. The patient is being followed for their right knee pain and osteoarthritis. They are now 14 week(s) out from Synvisc series. Symptoms reported today include: pain, swelling, aching, stiffness and difficulty ambulating. The patient feels that they are doing poorly and report their pain level to be moderate to severe. Current treatment includes: icing. The following medication has been used for pain control: antiinflammatory medication (Aleve). The patient has not gotten any relief of their symptoms with viscosupplementation. The knee is getting progressively worse over time. The Synvisc did not provide a tremendous amount of benefit. Cortisone tended to do better for her but was only temporary. She is ready to proceed with surgery for a more permanent solution. They have been treated conservatively in the past for the above stated problem and despite conservative measures, they continue to have progressive pain and severe functional limitations and dysfunction. They have failed non-operative management including home exercise, medications, and injections. It is felt that they would benefit from undergoing unicompartmental replacement. Risks and benefits of the procedure have been discussed with the patient and they elect to proceed with surgery. There are no active contraindications to surgery such as ongoing infection or rapidly progressive neurological disease.   Problem List Primary osteoarthritis  of one knee, right (715.16) Osteoarthritis, Hip (715.35)    Allergies Macrobid *URINARY ANTI-INFECTIVES* Nitrofurantoin *URINARY ANTI-INFECTIVES* Sulfanomides Avelox *FLUOROQUINOLONES* Penicillin VK *PENICILLINS* Biaxin *MACROLIDES* Bactrim *ANTI-INFECTIVE AGENTS - MISC.*    Family History Cerebrovascular Accident. First Degree Relatives. father Hypertension. mother    Social History Pain Contract. no Exercise. Exercises rarely; does running / walking Children. 3 Tobacco use. Never smoker. never smoker Living situation. live alone Illicit drug use. no Marital status. widowed Drug/Alcohol Rehab (Currently). no Alcohol use. 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 right Carpal Tunnel Repair. right Colon Polyp Removal - Colonoscopy Mastectomy. right   Medical History Hypercholesterolemia Cardiac Arrhythmia High blood pressure Breast cancer. Right-sided    Review of Systems General:Not Present- Chills, Fever, Night Sweats, Fatigue, Weight Gain, Weight Loss and Memory Loss. Skin:Not Present- Hives, Itching, Rash, Eczema and Lesions. HEENT:Not Present- Tinnitus, Headache, Double Vision, Visual Loss, Hearing Loss and Dentures. Respiratory:Not Present- Shortness of breath with exertion, Shortness of breath at rest, Allergies, Coughing up blood and Chronic Cough. Cardiovascular:Present- Palpitations. Not Present- Chest Pain, Racing/skipping heartbeats, Difficulty Breathing Lying Down, Murmur and Swelling. Gastrointestinal:Not Present- Bloody Stool, Heartburn, Abdominal Pain, Vomiting, Nausea, Constipation, Diarrhea, Difficulty Swallowing, Jaundice and Loss of appetitie. Female Genitourinary:Present- Urinating at Night. Not Present- Blood in Urine, Urinary  frequency, Weak urinary stream, Discharge, Flank Pain, Incontinence, Painful Urination, Urgency and Urinary Retention. Musculoskeletal:Present- Joint Pain. Not Present- Muscle Weakness, Muscle Pain, Joint Swelling, Back Pain, Morning Stiffness and Spasms. Neurological:Not Present- Tremor, Dizziness, Blackout spells, Paralysis, Difficulty with balance and Weakness. Psychiatric:Not Present- Insomnia.    Vitals Weight: 128 lb Height: 62.5 in  Body Surface Area: 1.6 m Body Mass Index: 23.04 kg/m Pulse: 68 (Regular) Resp.: 14 (Unlabored) BP: 132/86 (Sitting, Left Arm, Standard)     Physical Exam The physical exam findings are as follows:   General Mental Status - Alert, cooperative and good historian. General Appearance- pleasant. Not in acute distress. Orientation- Oriented X3. Build & Nutrition- Well nourished and Well developed.   Head and Neck Head- normocephalic, atraumatic . Neck Global Assessment- supple. no bruit auscultated on the right and no bruit auscultated on the left.   Eye Pupil- Bilateral- Regular and Round. Motion- Bilateral- EOMI.   Chest and Lung Exam Auscultation: Breath sounds:- clear at anterior chest wall and - clear at posterior chest wall. Adventitious sounds:- No Adventitious sounds.   Cardiovascular Auscultation:Rhythm- Regular rate and rhythm. Heart Sounds- S1 WNL and S2 WNL. Murmurs & Other Heart Sounds:Auscultation of the heart reveals - No Murmurs.   Abdomen Palpation/Percussion:Tenderness- Abdomen is non-tender to palpation. Rigidity (guarding)- Abdomen is soft. Auscultation:Auscultation of the abdomen reveals - Bowel sounds normal.   Female Genitourinary  Not done, not pertinent to present illness  Musculoskeletal On exam, she's alert and oriented, in no apparent distress. Her right knee shows no effusion. Range is about 5-120. There is marked crepitus on ROM. She's tender medial  greater than lateral with no instability noted.  Radiographs - She has bone on bone medial    Assessment & Plan Primary osteoarthritis of one knee, right (715.16) Impression: Right Knee  Note: Plan is for a Right Knee Unicompartmental Replacement by Dr. Lequita Halt.  Plan is to go home.  PCP - Dr. Joselyn Arrow - Patient has been seen preoperatively and felt to be stable for surgery.  The patient will not receive TXA (tranexamic acid) due to: Breast Cancer  Please note, do not use the right arm for blood pressures or blood draws due to previous right-sided mastectomy.  Signed electronically by Lauraine Rinne, III PA-C

## 2013-01-24 NOTE — Anesthesia Procedure Notes (Signed)
Spinal  Start time: 01/24/2013 3:29 PM Staffing Anesthesiologist: Lewie Loron R Performed by: anesthesiologist  Preanesthetic Checklist Completed: patient identified, site marked, surgical consent, pre-op evaluation, timeout performed, IV checked, risks and benefits discussed and monitors and equipment checked Spinal Block Patient position: sitting Prep: Betadine Patient monitoring: heart rate, cardiac monitor, continuous pulse ox and blood pressure Approach: right paramedian Location: L2-3 Injection technique: single-shot Needle Needle type: Spinocan  Needle gauge: 22 G Needle length: 9 cm Additional Notes Kit no. [id number] expiration date checked.  One attempt by CRNA midline, paramedian,  Second by MDA , tolerated well negative paresthesias clear csf

## 2013-01-24 NOTE — Interval H&P Note (Signed)
History and Physical Interval Note:  01/24/2013 2:38 PM  Mary [name]  has presented today for surgery, with the diagnosis of OA RIGHT KNEE  The various methods of treatment have been discussed with the patient and family. After consideration of risks, benefits and other options for treatment, the patient has consented to  Procedure(s): RIGHT KNEE MEDIAL UNICOMPARTMENTAL ARTHROPLASTY (Right) as a surgical intervention .  The patient's history has been reviewed, patient examined, no change in status, stable for surgery.  I have reviewed the patient's chart and labs.  Questions were answered to the patient's satisfaction.     Loanne Drilling

## 2013-01-24 NOTE — Anesthesia Postprocedure Evaluation (Signed)
Anesthesia Post Note  Patient: Mary [name]  Procedure(s) Performed: Procedure(s) (LRB): RIGHT KNEE MEDIAL UNICOMPARTMENTAL ARTHROPLASTY (Right)  Anesthesia type: Spinal  Patient location: PACU  Post pain: Pain level controlled  Post assessment: Post-op Vital signs reviewed  Last Vitals: BP 155/79  Pulse 78  Temp(Src) 36.5 C (Oral)  Resp 16  Ht 5\' 3"  (1.6 m)  Wt 130 lb (58.968 kg)  BMI 23.03 kg/m2  SpO2 97%  Post vital signs: Reviewed  Level of consciousness: sedated  Complications: No apparent anesthesia complications

## 2013-01-24 NOTE — Transfer of Care (Signed)
Immediate Anesthesia Transfer of Care Note  Patient: Mary [name]  Procedure(s) Performed: Procedure(s): RIGHT KNEE MEDIAL UNICOMPARTMENTAL ARTHROPLASTY (Right)  Patient Location: PACU  Anesthesia Type:spinal  Level of Consciousness: awake, alert , oriented and patient cooperative  Airway & Oxygen Therapy: Patient Spontanous Breathing and Patient connected to face mask oxygen  Post-op Assessment: Report given to PACU RN and Post -op Vital signs reviewed and stable  Post vital signs: Reviewed and stable  Complications: No apparent anesthesia complications

## 2013-01-24 NOTE — Anesthesia Preprocedure Evaluation (Addendum)
Anesthesia Evaluation  Patient identified by MRN, date of birth, ID band Patient awake    Reviewed: Allergy & Precautions, H&P , NPO status , Patient's Chart, lab work & pertinent test results  Airway Mallampati: II TM Distance: >3 FB Neck ROM: Full    Dental  (+) Dental Advisory Given   Pulmonary neg pulmonary ROS,  breath sounds clear to auscultation        Cardiovascular hypertension, + Peripheral Vascular Disease Rhythm:Regular Rate:Normal     Neuro/Psych negative neurological ROS  negative psych ROS   GI/Hepatic negative GI ROS, Neg liver ROS,   Endo/Other  negative endocrine ROS  Renal/GU negative Renal ROS     Musculoskeletal negative musculoskeletal ROS (+)   Abdominal   Peds  Hematology negative hematology ROS (+)   Anesthesia Other Findings   Reproductive/Obstetrics negative OB ROS                          Anesthesia Physical Anesthesia Plan  ASA: II  Anesthesia Plan: Spinal   Post-op Pain Management:    Induction:   Airway Management Planned:   Additional Equipment:   Intra-op Plan:   Post-operative Plan:   Informed Consent: I have reviewed the patients History and Physical, chart, labs and discussed the procedure including the risks, benefits and alternatives for the proposed anesthesia with the patient or authorized representative who has indicated his/her understanding and acceptance.   Dental advisory given  Plan Discussed with: CRNA  Anesthesia Plan Comments:         Anesthesia Quick Evaluation

## 2013-01-24 NOTE — Preoperative (Signed)
Beta Blockers   Reason not to administer Beta Blockers:Not Applicable 

## 2013-01-24 NOTE — Op Note (Signed)
OPERATIVE REPORT  PREOPERATIVE DIAGNOSIS: Medial compartment osteoarthritis, Right knee  POSTOPERATIVE DIAGNOSIS: Medial compartment osteoarthritis, Right knee  PROCEDURE:Right knee medial unicompartmental arthroplasty.   SURGEON: Ollen Gross, MD   ASSISTANT: Avel Peace, PA-C  ANESTHESIA:  Spinal.   ESTIMATED BLOOD LOSS: Minimal.   DRAINS: Hemovac x1.   TOURNIQUET TIME: 42 minutes at 300 mmHg.   COMPLICATIONS: None.   CONDITION: Stable to recovery.   BRIEF CLINICAL NOTE: Ms. Mary [name] is a 79 y.o. female, who has  significant isolated medial compartment arthritis of the Right knee. She has had nonoperative management including injections. She has had  cortisone and viscous supplements. Unfortunately, the pain persists.  Radiograph showed isolated medial compartment bone-on-bone arthritis  with normal-appearing patellofemoral and lateral compartments. She  presents now for left knee unicompartmental arthroplasty.   PROCEDURE IN DETAIL: After successful administration of  Spinal anesthetic, a tourniquet was placed high on the right thigh and right lower extremity prepped and draped in usual sterile fashion. Extremity  was wrapped in an Esmarch, knee flexed, and tourniquet inflated to 300  mmHg. A midline incision was made with a 10 blade through subcutaneous  tissue to the extensor mechanism. A fresh blade was used to make a  medial parapatellar arthrotomy. Soft tissue on the proximal medial  tibia subperiosteally elevated to the joint line with a knife and into  the semimembranosus bursa with a Cobb elevator. The patella was  subluxed laterally, and the knee flexed 90 degrees. The ACL was intact.  The marginal osteophytes on the medial femur and tibia were removed with  a rongeur. The medial meniscus was also removed. The femoral cutting  block of the conformis unicompartmental knee system was placed along  the femur. There was excellent fit. I traced  the outline. We then  removed any remaining cartilage within this outline. We then placed the  cutting block again and pinned in position. The posterior femoral cut  was made, it was approximately 5 mm. The lug holes for the femoral  component were then drilled through the cutting block. The cutting  block was subsequently removed. We then utilized the high speed bur to  create a small trough at the superior aspect of the components to allow for inset and  not overhang the cartilage. The trial was placed,  it had excellent fit. The trial was subsequently removed.  The trial was placed again and the B chip was placed. There was an  excellent balance throughout full motion. Also with excellent fit on  her tibia. This was removed as was the femoral trial. A curette was  used to remove any remaining cartilage from the tibia. The tibial  cutting block was then placed and there was a perfect fit on the tibial  surface. The appropriate slope was placed and it was pinned in  position. The reciprocating saw was used to make the central plug and  then the oscillating saw used to make the horizontal cut. The bone  fragment was then removed. The tibial trial was placed and had perfect  fit on the tibia. We then drilled the 2 lug holes with a keel punch.  We then placed tibia trial femur, and a 6 mm trial insert. There was  excellent stability throughout full range of motion and no impingement.  The trial was then removed. We used small drill holes for the distal  femur in order to create more conduits for the cement. The cut end  surfaces were thoroughly irrigated with pulsatile  lavage where the  cement was mixed on the back table. We then cemented the tibial  component into place, impacted it and removed the extruded cement. The  same was done for the femoral component. Trial 6-mm inserts placed,  knee held in full extension, and all extruded cement removed. While the  cement was hardening, I  injected the extensor mechanism, periosteum of  the femur and subcu tissues, a total of 20 mL of Exparel mixed with 30  mL of saline and then did an additional injection of 20 mL of 0.25%  Marcaine into the same tissues. When the cement had fully hardened,  then the permanent polyethylene was placed in tibial tray. There was  excellent stability throughout full range of motion with no lift off the  component and no evidence of any impingement. Wound was copiously  irrigated with saline solution, and the arthrotomy closed over a Hemovac  drain with a running #1 V-Loc suture. The subcutaneous was closed with  interrupted 2-0 Vicryl and subcuticular running 4-0 Monocryl. The drain  was hooked to suction. Incision cleaned and dried and Steri-Strips and  a bulky sterile dressing applied. The tourniquet was released after a  total time of 42 minutes. This was done after closing the extensor  mechanism. The wound was closed and a bulky sterile dressing was  applied. She was placed into a knee immobilizer, awakened and  transported to recovery room in stable condition.  Please note that a surgical assistant was a medical necessity for this  procedure in order to perform it in a safe and expeditious manner.  Assistance was necessary for retracting vital ligaments, neurovascular  structures, as well as for proper positioning of the limb to allow for  appropriate bone cuts and appropriate placement of the prosthesis.    Gus Rankin Matalie Romberger, MD

## 2013-01-25 LAB — CBC
HCT: 36.7 % (ref 36.0–46.0)
Hemoglobin: 12.2 g/dL (ref 12.0–15.0)
MCH: 28.9 pg (ref 26.0–34.0)
MCV: 87 fL (ref 78.0–100.0)
Platelets: 214 10*3/uL (ref 150–400)
RBC: 4.22 MIL/uL (ref 3.87–5.11)
WBC: 10 10*3/uL (ref 4.0–10.5)

## 2013-01-25 LAB — BASIC METABOLIC PANEL
BUN: 15 mg/dL (ref 6–23)
CO2: 20 mEq/L (ref 19–32)
Calcium: 8.5 mg/dL (ref 8.4–10.5)
Chloride: 104 mEq/L (ref 96–112)
Creatinine, Ser: 0.59 mg/dL (ref 0.50–1.10)
Glucose, Bld: 144 mg/dL — ABNORMAL HIGH (ref 70–99)

## 2013-01-25 MED ORDER — METHOCARBAMOL 500 MG PO TABS: 500.0000 mg | ORAL_TABLET | Freq: Four times a day (QID) | ORAL | Status: DC | PRN

## 2013-01-25 MED ORDER — OXYCODONE HCL 5 MG PO TABS: 5.0000 mg | ORAL_TABLET | ORAL | Status: DC | PRN

## 2013-01-25 MED ORDER — RIVAROXABAN 10 MG PO TABS: 10.0000 mg | ORAL_TABLET | Freq: Every day | ORAL | Status: DC

## 2013-01-25 MED ORDER — TRAMADOL HCL 50 MG PO TABS: 50.0000 mg | ORAL_TABLET | Freq: Four times a day (QID) | ORAL | Status: DC | PRN

## 2013-01-25 NOTE — Progress Notes (Signed)
   Subjective: 1 Day Post-Op Procedure(s) (LRB): RIGHT KNEE MEDIAL UNICOMPARTMENTAL ARTHROPLASTY (Right) Patient reports pain as mild.   We will start therapy today.  Plan is to go Home after hospital stay.  Objective: Vital signs in last 24 hours: Temp:  [97.3 F (36.3 C)-98.6 F (37 C)] 97.9 F (36.6 C) (11/08 0645) Pulse Rate:  [74-84] 81 (11/08 0645) Resp:  [14-18] 16 (11/08 0645) BP: (132-155)/(61-84) 136/81 mmHg (11/08 0645) SpO2:  [95 %-100 %] 96 % (11/08 0645) Weight:  [130 lb (58.968 kg)] 130 lb (58.968 kg) (11/07 1830)  Intake/Output from previous day:  Intake/Output Summary (Last 24 hours) at 01/25/13 0832 Last data filed at 01/25/13 0806  Gross per 24 hour  Intake   2580 ml  Output   2860 ml  Net   -280 ml    Intake/Output this shift: Total I/O In: 280 [P.O.:280] Out: -   Labs:  Recent Labs  01/25/13 0530  HGB 12.2    Recent Labs  01/25/13 0530  WBC 10.0  RBC 4.22  HCT 36.7  PLT 214    Recent Labs  01/25/13 0530  NA 137  K 3.9  CL 104  CO2 20  BUN 15  CREATININE 0.59  GLUCOSE 144*  CALCIUM 8.5   No results found for this basename: LABPT, INR,  in the last 72 hours  EXAM General - Patient is Alert, Appropriate and Oriented Extremity - Neurologically intact Neurovascular intact Incision: dressing C/D/I No cellulitis present Compartment soft Dressing - dressing C/D/I Motor Function - intact, moving foot and toes well on exam.  Hemovac pulled without difficulty.  Past Medical History  Diagnosis Date  . Heart palpitations   . Breast cancer 10/07    right; invasive ductal CA; Dr. Donnie Coffin  . Impaired fasting glucose   . Mixed hyperlipidemia   . Constipation   . External hemorrhoid   . Adenomatous colon polyp 3/03  . Colonic mass 2007    high grade dysplasia (s/p L hemicolectomy)  . BCC (basal cell carcinoma), face 8/06    L nasolabial fold  . CTS (carpal tunnel syndrome)     right  . Osteoporosis     DEXA 01/2009; T-3.2 L  fem neck, -2.4 L radius    Assessment/Plan: 1 Day Post-Op Procedure(s) (LRB): RIGHT KNEE MEDIAL UNICOMPARTMENTAL ARTHROPLASTY (Right) Principal Problem:   OA (osteoarthritis) of knee   Advance diet Up with therapy D/C IV fluids Discharge home with home health  DVT Prophylaxis - Xarelto Weight-Bearing as tolerated to right leg D/C O2 and Pulse OX and try on Room Air  Kysean Sweet V 01/25/2013, 8:32 AM

## 2013-01-25 NOTE — Progress Notes (Signed)
   CARE MANAGEMENT NOTE 01/25/2013  Patient:  JESSICAH, CROLL   Account Number:  [id number]  Date Initiated:  01/25/2013  Documentation initiated by:  [facility]  Subjective/Objective Assessment:   RIGHT KNEE MEDIAL UNICOMPARTMENTAL ARTHROPLASTY (Right)     Action/Plan:   lives at home with Cory Munch # [phone]   Anticipated DC Date:  01/26/2013   Anticipated DC Plan:  HOME W HOME HEALTH SERVICES      DC Planning Services  CM consult      [facility] Choice  HOME HEALTH   Choice offered to / List presented to:  C-1 Patient        HH arranged  HH-2 PT      [facility] agency  [facility]   Status of service:  Completed, signed off Medicare Important Message given?   (If response is "NO", the following Medicare IM given date fields will be blank) Date Medicare IM given:   Date Additional Medicare IM given:    Discharge Disposition:  HOME W HOME HEALTH SERVICES  Per UR Regulation:    If discussed at Long Length of Stay Meetings, dates discussed:    Comments:  01/25/2013 1215 NCM spoke to pt and gave permission to speak with dtr. Susie. Dtr states MD provided her with Rx for Motorized Cryo Cuff. Sent message to [facility] to see if they have item. Contacted [facility] DME rep for RW. [name] Donning RN CCM Case Mgmt phone [phone]

## 2013-01-25 NOTE — Evaluation (Signed)
Physical Therapy Evaluation Patient Details Name: Mary [name] MRN: [id number] DOB: 02-Aug-1926 Today's Date: 01/25/2013 Time: 0921-0950 PT Time Calculation (min): 29 min  PT Assessment / Plan / Recommendation History of Present Illness  s/p R UKR  Clinical Impression  Pt progressing; will practice stairs and further there ex in preparation for home once pt's daughter's get here    PT Assessment  Patient needs continued PT services    Follow Up Recommendations  Home health PT;Supervision for mobility/OOB    Does the patient have the potential to tolerate intense rehabilitation      Barriers to Discharge        Equipment Recommendations  Other (comment) (TBA)    Recommendations for Other Services     Frequency 7X/week    Precautions / Restrictions Precautions Precautions: Knee Required Braces or Orthoses: Knee Immobilizer - Right Knee Immobilizer - Right: Discontinue once straight leg raise with < 10 degree lag Restrictions Weight Bearing Restrictions: No Other Position/Activity Restrictions: WBAT   Pertinent Vitals/Pain Pain  "ok"      Mobility  Bed Mobility Bed Mobility: Supine to Sit Supine to Sit: 4: Min assist Details for Bed Mobility Assistance: min with RLE; incr time Transfers Transfers: Sit to Stand;Stand to Sit Sit to Stand: 4: Min assist Stand to Sit: 4: Min assist Details for Transfer Assistance: cues for hand placment and RLE position Ambulation/Gait Ambulation/Gait Assistance: 4: Min Environmental consultant (Feet): 50 Feet Assistive device: Rolling walker Ambulation/Gait Assistance Details: incr time; verbal cues for sequence and use of UEs Gait Pattern: Step-to pattern;Trunk flexed Gait velocity: decr    Exercises Total Joint Exercises Ankle Circles/Pumps: AROM;Both;5 reps Quad Sets: 5 reps;AROM;Both Heel Slides: AAROM;Right;10 reps   PT Diagnosis: Difficulty walking  PT Problem List: Decreased strength;Decreased range of  motion;Decreased activity tolerance;Decreased balance;Decreased mobility;Decreased knowledge of use of DME PT Treatment Interventions: DME instruction;Gait training;Stair training;Functional mobility training;Therapeutic activities;Therapeutic exercise;Patient/family education     PT Goals(Current goals can be found in the care plan section) Acute Rehab PT Goals Patient Stated Goal: home, less pain PT Goal Formulation: With patient Time For Goal Achievement: 01/27/13 Potential to Achieve Goals: Good  Visit Information  Last PT Received On: 01/25/13 History of Present Illness: s/p R UKR       Prior Functioning  Home Living Family/patient expects to be discharged to:: Private residence Living Arrangements: Alone Available Help at Discharge: Family Type of Home: House Home Access: Stairs to enter Secretary/administrator of Steps: 3  Entrance Stairs-Rails: Right;Left;Can reach both Home Layout: One level Home Equipment: Cane - single point Additional Comments: dtr is bringing 2 walkers  Prior Function Level of Independence: Independent with assistive device(s);Independent Comments: amb with cane Communication Communication: No difficulties    Cognition  Cognition Arousal/Alertness: Awake/alert Behavior During Therapy: WFL for tasks assessed/performed Overall Cognitive Status: Within Functional Limits for tasks assessed    Extremity/Trunk Assessment Upper Extremity Assessment Upper Extremity Assessment: Overall WFL for tasks assessed Lower Extremity Assessment Lower Extremity Assessment: RLE deficits/detail RLE Deficits / Details: ankle WFL; able to assist minimally with SLR RLE: Unable to fully assess due to pain   Balance    End of Session PT - End of Session Equipment Utilized During Treatment: Gait belt;Right knee immobilizer Activity Tolerance: Patient tolerated treatment well Patient left: in chair;with call bell/phone within reach CPM Right Knee CPM Right Knee:  Off  GP     [facility] 01/25/2013, 10:04 AM

## 2013-01-25 NOTE — Progress Notes (Signed)
Physical Therapy Treatment Patient Details Name: Mary [name] MRN: [id number] DOB: September 13, 1926 Today's Date: 01/25/2013 Time: 4132-4401 PT Time Calculation (min): 56 min  PT Assessment / Plan / Recommendation  History of Present Illness s/p R UKR   PT Comments   Pt progressing, doing well this pm but  Daughters state she isn't comfortable going home today even though she does have 24hr assist for approx 1 wk.  Lengthy session of PT this pm, daughters educated/instructed in assist for transfers, stairs, precautions, etc; Will see again in am;   Follow Up Recommendations  Home health PT;Supervision for mobility/OOB     Does the patient have the potential to tolerate intense rehabilitation     Barriers to Discharge        Equipment Recommendations  Rolling walker with 5" wheels    Recommendations for Other Services    Frequency 7X/week   Progress towards PT Goals Progress towards PT goals: Progressing toward goals  Plan Current plan remains appropriate    Precautions / Restrictions Precautions Precautions: Knee Precaution Comments: pt and daughters instructed in use and donning/doffing of  KI Required Braces or Orthoses: Knee Immobilizer - Right Knee Immobilizer - Right: Discontinue once straight leg raise with < 10 degree lag Restrictions Weight Bearing Restrictions: No Other Position/Activity Restrictions: WBAT   Pertinent Vitals/Pain Pain meds requested although pain "ok" during therapy session    Mobility  Bed Mobility Bed Mobility: Sit to Supine Sit to Supine: 4: Min assist Details for Bed Mobility Assistance: min with RLE; incr time Transfers Transfers: Sit to Stand;Stand to Sit Sit to Stand: 4: Min guard;From chair/3-in-1;With upper extremity assist Stand to Sit: 4: Min guard;To bed;To chair/3-in-1 Details for Transfer Assistance: cues for hand placment and RLE position Ambulation/Gait Ambulation/Gait Assistance: 4: Min guard Ambulation Distance (Feet): 100  Feet Assistive device: Rolling walker Ambulation/Gait Assistance Details: verbal cues for RW safety, posture Gait Pattern: Step-to pattern;Trunk flexed Gait velocity: decr Stairs: Yes Stairs Assistance: 4: Min assist;4: Min guard Stairs Assistance Details (indicate cue type and reason): min/guard for safety; verbal cues for sequence Stair Management Technique: Two rails;Forwards Number of Stairs: 5 (times 2)    Exercises Total Joint Exercises Ankle Circles/Pumps: AROM;Both;10 reps Quad Sets: AROM;10 reps;Both Heel Slides: AAROM;Right;10 reps Hip ABduction/ADduction: AAROM;AROM;Right;10 reps Straight Leg Raises: AAROM;Right;10 reps   PT Diagnosis:    PT Problem List:   PT Treatment Interventions:     PT Goals (current goals can now be found in the care plan section) Acute Rehab PT Goals Patient Stated Goal: decrease pain Time For Goal Achievement: 01/27/13 Potential to Achieve Goals: Good  Visit Information  Last PT Received On: 01/25/13 Assistance Needed: +1 History of Present Illness: s/p R UKR    Subjective Data  Subjective: pt in chair, 2 daughters present Patient Stated Goal: decrease pain   Cognition  Cognition Arousal/Alertness: Awake/alert Behavior During Therapy: WFL for tasks assessed/performed Overall Cognitive Status: Within Functional Limits for tasks assessed    Balance  Balance Balance Assessed: Yes Dynamic Standing Balance Dynamic Standing - Level of Assistance: 4: Min assist  End of Session PT - End of Session Equipment Utilized During Treatment: Right knee immobilizer Activity Tolerance: Patient tolerated treatment well CPM Right Knee CPM Right Knee: On   GP     [facility] 01/25/2013, 4:23 PM

## 2013-01-25 NOTE — Progress Notes (Signed)
D/C order cancelled for today. Pt does not yet feel ready for D/C home today and prefers to wait until tomorrow as she is returning home with HHPT, not going to a SNF.

## 2013-01-25 NOTE — Evaluation (Signed)
Occupational Therapy Evaluation Patient Details Name: Mary [name] MRN: [id number] DOB: Apr 05, 1926 Today's Date: 01/25/2013 Time: 4098-1191 OT Time Calculation (min): 28 min  OT Assessment / Plan / Recommendation History of present illness s/p R UKR   Clinical Impression   Pt up to 3in1 to practice toilet transfers. She will benefit from skilled OT services to maximize ADL independence for d/c home with family.    OT Assessment  Patient needs continued OT Services    Follow Up Recommendations  Home health OT;Supervision/Assistance - 24 hour    Barriers to Discharge      Equipment Recommendations  3 in 1 bedside comode (if pt unable to borrow a 3in1)    Recommendations for Other Services    Frequency  Min 2X/week    Precautions / Restrictions Precautions Precautions: Knee Required Braces or Orthoses: Knee Immobilizer - Right Knee Immobilizer - Right: Discontinue once straight leg raise with < 10 degree lag Restrictions Weight Bearing Restrictions: No Other Position/Activity Restrictions: WBAT   Pertinent Vitals/Pain 5/10 R knee, 4/10 headache; reposition, ice, nursing aware of headache/pain    ADL  Eating/Feeding: Simulated;Independent Where Assessed - Eating/Feeding: Chair Grooming: Performed;Wash/dry hands;Minimal assistance Where Assessed - Grooming: Unsupported standing Upper Body Bathing: Simulated;Chest;Right arm;Left arm;Abdomen;Set up Where Assessed - Upper Body Bathing: Unsupported sitting Lower Body Bathing: Simulated;Minimal assistance Where Assessed - Lower Body Bathing: Supported sit to stand Upper Body Dressing: Simulated;Set up Where Assessed - Upper Body Dressing: Unsupported sitting Lower Body Dressing: Simulated;Minimal assistance;Moderate assistance Where Assessed - Lower Body Dressing: Supported sit to Pharmacist, hospital: Performed;Minimal Web designer: Raised toilet seat with arms (or 3-in-1 over toilet) Toileting -  Clothing Manipulation and Hygiene: Simulated;Minimal assistance Where Assessed - Engineer, mining and Hygiene: Sit to stand from 3-in-1 or toilet Equipment Used: Rolling walker;Knee Immobilizer ADL Comments: Family can help with LB ADL at d/c. She has a shower seat but is checking to see if she can borrow a 3in1. Educated pt and daughters on [place] use and how to don/doff.    OT Diagnosis: Generalized weakness  OT Problem List: Decreased strength;Decreased knowledge of use of DME or AE OT Treatment Interventions: Self-care/ADL training;DME and/or AE instruction;Therapeutic activities;Patient/family education   OT Goals(Current goals can be found in the care plan section) Acute Rehab OT Goals Patient Stated Goal: decrease pain OT Goal Formulation: With patient/family Time For Goal Achievement: 02/01/13 Potential to Achieve Goals: Good  Visit Information  Last OT Received On: 01/25/13 Assistance Needed: +1 History of Present Illness: s/p R UKR       Prior Functioning     Home Living Family/patient expects to be discharged to:: Private residence Living Arrangements: Alone Available Help at Discharge: Family Type of Home: House Home Access: Stairs to enter Secretary/administrator of Steps: 3  Entrance Stairs-Rails: Right;Left;Can reach both Home Layout: One level Home Equipment: Cane - single point;Shower seat Additional Comments: dtr is bringing 2 walkers ; daughters think they can borrow a 3in1 Prior Function Level of Independence: Independent with assistive device(s);Independent Comments: amb with cane Communication Communication: No difficulties         Vision/Perception     Cognition  Cognition Arousal/Alertness: Awake/alert Behavior During Therapy: WFL for tasks assessed/performed Overall Cognitive Status: Within Functional Limits for tasks assessed    Extremity/Trunk Assessment Upper Extremity Assessment Upper Extremity Assessment: Overall WFL for  tasks assessed     Mobility Bed Mobility Bed Mobility: Supine to Sit Supine to Sit: 4: Min assist Details for Bed  Mobility Assistance: min with RLE; incr time Transfers Transfers: Sit to Stand;Stand to Sit Sit to Stand: 4: Min assist;With upper extremity assist;From chair/3-in-1 Stand to Sit: 4: Min assist;With upper extremity assist;To chair/3-in-1 Details for Transfer Assistance: cues for hand placment and RLE position        Balance Balance Balance Assessed: Yes Dynamic Standing Balance Dynamic Standing - Level of Assistance: 4: Min assist   End of Session OT - End of Session Equipment Utilized During Treatment: Gait belt;Rolling walker;Right knee immobilizer Activity Tolerance: Patient tolerated treatment well Patient left: in chair;with call bell/phone within reach;with family/visitor present  GO     Lennox Laity [phone] 01/25/2013, 12:45 PM

## 2013-01-26 LAB — BASIC METABOLIC PANEL
BUN: 22 mg/dL (ref 6–23)
CO2: 25 mEq/L (ref 19–32)
Chloride: 102 mEq/L (ref 96–112)
Creatinine, Ser: 0.67 mg/dL (ref 0.50–1.10)
GFR calc Af Amer: >90 mL/min (ref >90)
Glucose, Bld: 143 mg/dL — ABNORMAL HIGH (ref 70–99)
Potassium: 4.1 mEq/L (ref 3.5–5.1)

## 2013-01-26 LAB — CBC
HCT: 37.6 % (ref 36.0–46.0)
Hemoglobin: 12.3 g/dL (ref 12.0–15.0)
MCH: 28.6 pg (ref 26.0–34.0)
MCV: 87.4 fL (ref 78.0–100.0)
Platelets: 248 10*3/uL (ref 150–400)
RBC: 4.3 MIL/uL (ref 3.87–5.11)
WBC: 15.3 10*3/uL — ABNORMAL HIGH (ref 4.0–10.5)

## 2013-01-26 NOTE — Progress Notes (Signed)
Occupational Therapy Treatment Patient Details Name: Mary [name] MRN: [id number] DOB: 05-01-1926 Today's Date: 01/26/2013 Time: 4098-1191 OT Time Calculation (min): 43 min  OT Assessment / Plan / Recommendation  History of present illness s/p R UKR (Simultaneous filing. User may not have seen previous data.)   OT comments  Completed education with pt/family regarding ADL/use of DME and AE. Discussed home safety. Pt making good progress. Ready to D/C home with family and continue with HHOT.  Follow Up Recommendations  Home health OT;Supervision/Assistance - 24 hour    Barriers to Discharge       Equipment Recommendations  3 in 1 bedside comode    Recommendations for Other Services    Frequency Min 2X/week   Progress towards OT Goals Progress towards OT goals: Progressing toward goals  Plan Discharge plan remains appropriate    Precautions / Restrictions Precautions Precautions: Knee (Simultaneous filing. User may not have seen previous data.) Precaution Comments: pt and daughters instructed in use and donning/doffing of  KI Required Braces or Orthoses: Knee Immobilizer - Right Knee Immobilizer - Right: Discontinue once straight leg raise with < 10 degree lag Restrictions Weight Bearing Restrictions: No Other Position/Activity Restrictions: WBAT   Pertinent Vitals/Pain 5. Knee. Pt prefers to wait until she eats before she takes something for pain.. Repositioned. Ice to knee.    ADL  Toilet Transfer: Supervision/safety Statistician Method: Other (comment) (ambulating) Acupuncturist: Bedside commode Toileting - Clothing Manipulation and Hygiene: Minimal assistance Where Assessed - Engineer, mining and Hygiene: Sit to stand from 3-in-1 or toilet Tub/Shower Transfer: Min guard Tub/Shower Transfer Method: Other (comment) (ambulating) Psychologist, educational: Other (comment) (3 in 1) Equipment Used: Rolling walker;Sock aid;Reacher;Gait  belt;Knee Immobilizer Transfers/Ambulation Related to ADLs: S ADL Comments: Educated daughters and pt on use of DME and AE for ADL and mobility. Daughter independently donned/doffed KI. Daughter completed toilet transfer with pt. Demonstrated use of AE for LB ADL if interested. Discussed home safety with pt/daughters.    OT Diagnosis:    OT Problem List:   OT Treatment Interventions:     OT Goals(current goals can now be found in the care plan section) Acute Rehab OT Goals Patient Stated Goal: decrease pain OT Goal Formulation: With patient/family Time For Goal Achievement: 02/01/13 Potential to Achieve Goals: Good ADL Goals Pt Will Perform Grooming: with supervision;standing Pt Will Transfer to Toilet: with supervision;ambulating;bedside commode Pt Will Perform Toileting - Clothing Manipulation and hygiene: with supervision;sit to/from stand Pt Will Perform Tub/Shower Transfer: Shower transfer;with min assist;shower seat  Visit Information  Last OT Received On: 01/26/13 Assistance Needed: +1 (Simultaneous filing. User may not have seen previous data.) History of Present Illness: s/p R UKR (Simultaneous filing. User may not have seen previous data.)    Subjective Data      Prior Functioning       Cognition  Cognition Arousal/Alertness: Awake/alert Behavior During Therapy: WFL for tasks assessed/performed Overall Cognitive Status: Within Functional Limits for tasks assessed    Mobility  Bed Mobility Bed Mobility: Supine to Sit Supine to Sit: 5: Supervision Sit to Supine: 5: Supervision Transfers Transfers: Sit to Stand;Stand to Sit Sit to Stand: 5: Supervision Stand to Sit: 5: Supervision Details for Transfer Assistance: cues for hand placment and RLE position    Exercises  Other Exercises Other Exercises: Discussed importance of terminal extension at times during the day and importance of performing ex given by PT   Balance     End of Session OT -  End of  Session Equipment Utilized During Treatment: Gait belt;Rolling walker;Right knee immobilizer Activity Tolerance: Patient tolerated treatment well Patient left: in chair;with call bell/phone within reach;with family/visitor present Nurse Communication: Mobility status  GO     Mary [name],Mary [name] 01/26/2013, 12:33 PM [facility], OTR/L  [phone] 01/26/2013

## 2013-01-26 NOTE — Progress Notes (Addendum)
Physical Therapy Treatment Patient Details Name: Mary [name] MRN: [id number] DOB: [birth year] Today's Date: 01/26/2013 Time: 4098-1191 PT Time Calculation (min): 40 min  PT Assessment / Plan / Recommendation  History of Present Illness s/p R UKR (Simultaneous filing. User may not have seen previous data.)   PT Comments   Daughters present for entirety of session;  Pt moving well but with increased pain despite being premedicated;  We have reviewed all mobility this am and pt feels prepared for D/C per her report; Will benefit greatly from HHPT; Dsg changed at request of pt and dtrs..RN  notified and family instructed in technique; RN to reinforce during D/C instructions  Follow Up Recommendations  Home health PT;Supervision for mobility/OOB     Does the patient have the potential to tolerate intense rehabilitation     Barriers to Discharge        Equipment Recommendations  Rolling walker with 5" wheels (ht adjusted)    Recommendations for Other Services    Frequency 7X/week   Progress towards PT Goals Progress towards PT goals: Progressing toward goals  Plan Current plan remains appropriate    Precautions / Restrictions Precautions Precautions: Knee (Simultaneous filing. User may not have seen previous data.) Precaution Comments: pt and daughters instructed in use and donning/doffing of  KI Required Braces or Orthoses: Knee Immobilizer - Right Knee Immobilizer - Right: Discontinue once straight leg raise with < 10 degree lag Restrictions Weight Bearing Restrictions: No Other Position/Activity Restrictions: WBAT   Pertinent Vitals/Pain C/o pain  Right knee not rated by pt    Mobility  Bed Mobility Bed Mobility: Supine to Sit Supine to Sit: 5: Supervision Sit to Supine: 5: Supervision Transfers Transfers: Sit to Stand;Stand to Sit Sit to Stand: 5: Supervision Stand to Sit: 5: Supervision Details for Transfer Assistance: cues for hand placment and RLE  position Ambulation/Gait Ambulation/Gait Assistance: 4: Min guard;5: Supervision (close supervision) Ambulation Distance (Feet): 130 Feet Assistive device: Rolling walker Ambulation/Gait Assistance Details: cues for step length, posture Gait Pattern: Step-to pattern;Trunk flexed Gait velocity: decr Stairs:  (reviewed technique verbally)    Exercises Total Joint Exercises Ankle Circles/Pumps: AROM;Both;10 reps Quad Sets: AROM;10 reps;Both Heel Slides: AAROM;Right;10 reps Hip ABduction/ADduction: AAROM;AROM;Right;10 reps Straight Leg Raises: AAROM;Right;10 reps   PT Diagnosis:    PT Problem List:   PT Treatment Interventions:     PT Goals (current goals can now be found in the care plan section) Acute Rehab PT Goals Patient Stated Goal: decrease pain Time For Goal Achievement: 01/27/13 Potential to Achieve Goals: Good  Visit Information  Last PT Received On: 01/26/13 Assistance Needed: +1 (Simultaneous filing. User may not have seen previous data.) History of Present Illness: s/p R UKR (Simultaneous filing. User may not have seen previous data.)    Subjective Data  Subjective: pt in chair, 2 daughters present Patient Stated Goal: decrease pain   Cognition  Cognition Arousal/Alertness: Awake/alert Behavior During Therapy: WFL for tasks assessed/performed Overall Cognitive Status: Within Functional Limits for tasks assessed    Balance     End of Session PT - End of Session Equipment Utilized During Treatment: Right knee immobilizer Activity Tolerance: Patient tolerated treatment well Patient left: in chair;with call bell/phone within reach;with family/visitor present Nurse Communication: Mobility status   GP     [facility] 01/26/2013, 12:31 PM

## 2013-01-26 NOTE — Progress Notes (Signed)
Mary [name]  MRN: [id number] DOB/Age: [birth year] 79 y.o. Physician: Mary [name], M.D. 2 Days Post-Op Procedure(s) (LRB): RIGHT KNEE MEDIAL UNICOMPARTMENTAL ARTHROPLASTY (Right)  Subjective: Feeling better, anxious to go home Vital Signs Temp:  [97.5 F (36.4 C)-98.1 F (36.7 C)] 97.9 F (36.6 C) (11/09 0627) Pulse Rate:  [76-88] 76 (11/09 0627) Resp:  [14-16] 16 (11/09 0627) BP: (133-159)/(72-89) 159/89 mmHg (11/09 0627) SpO2:  [92 %-98 %] 92 % (11/08 2101)  Lab Results  Recent Labs  01/25/13 0530 01/26/13 0417  WBC 10.0 15.3*  HGB 12.2 12.3  HCT 36.7 37.6  PLT 214 248   BMET  Recent Labs  01/25/13 0530 01/26/13 0417  NA 137 137  K 3.9 4.1  CL 104 102  CO2 20 25  GLUCOSE 144* 143*  BUN 15 22  CREATININE 0.59 0.67  CALCIUM 8.5 9.2   INR  Date Value Range Status  01/16/2013 1.00  0.00 - 1.49 Final     Exam  Dressings dry, fair quad tone, N/ v intact  Plan D/c home   Mary [name] M 01/26/2013, 9:44 AM

## 2013-01-26 NOTE — Progress Notes (Signed)
Pt discharged home; discharge instructions explained to patient and her daughters; a copy of discharge instructions were given to the patient along w/ her prescriptions; pt and her daughters verbalized understanding discharge instructions.

## 2013-01-27 ENCOUNTER — Encounter (HOSPITAL_COMMUNITY): Payer: Self-pay | Admitting: Orthopedic Surgery

## 2013-01-27 DIAGNOSIS — M6281 Muscle weakness (generalized): Secondary | ICD-10-CM | POA: Diagnosis not present

## 2013-01-27 DIAGNOSIS — Z96659 Presence of unspecified artificial knee joint: Secondary | ICD-10-CM | POA: Diagnosis not present

## 2013-01-27 DIAGNOSIS — IMO0001 Reserved for inherently not codable concepts without codable children: Secondary | ICD-10-CM | POA: Diagnosis not present

## 2013-01-27 DIAGNOSIS — R269 Unspecified abnormalities of gait and mobility: Secondary | ICD-10-CM | POA: Diagnosis not present

## 2013-01-27 DIAGNOSIS — Z471 Aftercare following joint replacement surgery: Secondary | ICD-10-CM | POA: Diagnosis not present

## 2013-01-28 DIAGNOSIS — Z471 Aftercare following joint replacement surgery: Secondary | ICD-10-CM | POA: Diagnosis not present

## 2013-01-28 DIAGNOSIS — IMO0001 Reserved for inherently not codable concepts without codable children: Secondary | ICD-10-CM | POA: Diagnosis not present

## 2013-01-28 DIAGNOSIS — M6281 Muscle weakness (generalized): Secondary | ICD-10-CM | POA: Diagnosis not present

## 2013-01-28 DIAGNOSIS — R269 Unspecified abnormalities of gait and mobility: Secondary | ICD-10-CM | POA: Diagnosis not present

## 2013-01-28 DIAGNOSIS — Z96659 Presence of unspecified artificial knee joint: Secondary | ICD-10-CM | POA: Diagnosis not present

## 2013-01-29 DIAGNOSIS — M6281 Muscle weakness (generalized): Secondary | ICD-10-CM | POA: Diagnosis not present

## 2013-01-29 DIAGNOSIS — Z471 Aftercare following joint replacement surgery: Secondary | ICD-10-CM | POA: Diagnosis not present

## 2013-01-29 DIAGNOSIS — Z96659 Presence of unspecified artificial knee joint: Secondary | ICD-10-CM | POA: Diagnosis not present

## 2013-01-29 DIAGNOSIS — IMO0001 Reserved for inherently not codable concepts without codable children: Secondary | ICD-10-CM | POA: Diagnosis not present

## 2013-01-29 DIAGNOSIS — R269 Unspecified abnormalities of gait and mobility: Secondary | ICD-10-CM | POA: Diagnosis not present

## 2013-01-30 DIAGNOSIS — Z96659 Presence of unspecified artificial knee joint: Secondary | ICD-10-CM | POA: Diagnosis not present

## 2013-01-30 DIAGNOSIS — IMO0001 Reserved for inherently not codable concepts without codable children: Secondary | ICD-10-CM | POA: Diagnosis not present

## 2013-01-30 DIAGNOSIS — R269 Unspecified abnormalities of gait and mobility: Secondary | ICD-10-CM | POA: Diagnosis not present

## 2013-01-30 DIAGNOSIS — M6281 Muscle weakness (generalized): Secondary | ICD-10-CM | POA: Diagnosis not present

## 2013-01-30 DIAGNOSIS — Z471 Aftercare following joint replacement surgery: Secondary | ICD-10-CM | POA: Diagnosis not present

## 2013-01-31 DIAGNOSIS — Z471 Aftercare following joint replacement surgery: Secondary | ICD-10-CM | POA: Diagnosis not present

## 2013-01-31 DIAGNOSIS — M6281 Muscle weakness (generalized): Secondary | ICD-10-CM | POA: Diagnosis not present

## 2013-01-31 DIAGNOSIS — R269 Unspecified abnormalities of gait and mobility: Secondary | ICD-10-CM | POA: Diagnosis not present

## 2013-01-31 DIAGNOSIS — IMO0001 Reserved for inherently not codable concepts without codable children: Secondary | ICD-10-CM | POA: Diagnosis not present

## 2013-01-31 DIAGNOSIS — Z96659 Presence of unspecified artificial knee joint: Secondary | ICD-10-CM | POA: Diagnosis not present

## 2013-02-03 DIAGNOSIS — Z471 Aftercare following joint replacement surgery: Secondary | ICD-10-CM | POA: Diagnosis not present

## 2013-02-03 DIAGNOSIS — R269 Unspecified abnormalities of gait and mobility: Secondary | ICD-10-CM | POA: Diagnosis not present

## 2013-02-03 DIAGNOSIS — IMO0001 Reserved for inherently not codable concepts without codable children: Secondary | ICD-10-CM | POA: Diagnosis not present

## 2013-02-03 DIAGNOSIS — M6281 Muscle weakness (generalized): Secondary | ICD-10-CM | POA: Diagnosis not present

## 2013-02-03 DIAGNOSIS — Z96659 Presence of unspecified artificial knee joint: Secondary | ICD-10-CM | POA: Diagnosis not present

## 2013-02-04 DIAGNOSIS — Z96659 Presence of unspecified artificial knee joint: Secondary | ICD-10-CM | POA: Diagnosis not present

## 2013-02-04 DIAGNOSIS — R269 Unspecified abnormalities of gait and mobility: Secondary | ICD-10-CM | POA: Diagnosis not present

## 2013-02-04 DIAGNOSIS — Z471 Aftercare following joint replacement surgery: Secondary | ICD-10-CM | POA: Diagnosis not present

## 2013-02-04 DIAGNOSIS — M6281 Muscle weakness (generalized): Secondary | ICD-10-CM | POA: Diagnosis not present

## 2013-02-04 DIAGNOSIS — IMO0001 Reserved for inherently not codable concepts without codable children: Secondary | ICD-10-CM | POA: Diagnosis not present

## 2013-02-05 DIAGNOSIS — M6281 Muscle weakness (generalized): Secondary | ICD-10-CM | POA: Diagnosis not present

## 2013-02-05 DIAGNOSIS — R269 Unspecified abnormalities of gait and mobility: Secondary | ICD-10-CM | POA: Diagnosis not present

## 2013-02-05 DIAGNOSIS — IMO0001 Reserved for inherently not codable concepts without codable children: Secondary | ICD-10-CM | POA: Diagnosis not present

## 2013-02-05 DIAGNOSIS — Z96659 Presence of unspecified artificial knee joint: Secondary | ICD-10-CM | POA: Diagnosis not present

## 2013-02-05 DIAGNOSIS — Z471 Aftercare following joint replacement surgery: Secondary | ICD-10-CM | POA: Diagnosis not present

## 2013-02-06 DIAGNOSIS — Z471 Aftercare following joint replacement surgery: Secondary | ICD-10-CM | POA: Diagnosis not present

## 2013-02-06 DIAGNOSIS — Z96659 Presence of unspecified artificial knee joint: Secondary | ICD-10-CM | POA: Diagnosis not present

## 2013-02-06 DIAGNOSIS — IMO0001 Reserved for inherently not codable concepts without codable children: Secondary | ICD-10-CM | POA: Diagnosis not present

## 2013-02-06 DIAGNOSIS — M6281 Muscle weakness (generalized): Secondary | ICD-10-CM | POA: Diagnosis not present

## 2013-02-06 DIAGNOSIS — R269 Unspecified abnormalities of gait and mobility: Secondary | ICD-10-CM | POA: Diagnosis not present

## 2013-02-07 DIAGNOSIS — R269 Unspecified abnormalities of gait and mobility: Secondary | ICD-10-CM | POA: Diagnosis not present

## 2013-02-07 DIAGNOSIS — IMO0001 Reserved for inherently not codable concepts without codable children: Secondary | ICD-10-CM | POA: Diagnosis not present

## 2013-02-07 DIAGNOSIS — Z471 Aftercare following joint replacement surgery: Secondary | ICD-10-CM | POA: Diagnosis not present

## 2013-02-07 DIAGNOSIS — M6281 Muscle weakness (generalized): Secondary | ICD-10-CM | POA: Diagnosis not present

## 2013-02-07 DIAGNOSIS — Z96659 Presence of unspecified artificial knee joint: Secondary | ICD-10-CM | POA: Diagnosis not present

## 2013-02-10 DIAGNOSIS — Z96659 Presence of unspecified artificial knee joint: Secondary | ICD-10-CM | POA: Diagnosis not present

## 2013-02-10 DIAGNOSIS — M6281 Muscle weakness (generalized): Secondary | ICD-10-CM | POA: Diagnosis not present

## 2013-02-10 DIAGNOSIS — R269 Unspecified abnormalities of gait and mobility: Secondary | ICD-10-CM | POA: Diagnosis not present

## 2013-02-10 DIAGNOSIS — Z471 Aftercare following joint replacement surgery: Secondary | ICD-10-CM | POA: Diagnosis not present

## 2013-02-10 DIAGNOSIS — IMO0001 Reserved for inherently not codable concepts without codable children: Secondary | ICD-10-CM | POA: Diagnosis not present

## 2013-02-11 DIAGNOSIS — R269 Unspecified abnormalities of gait and mobility: Secondary | ICD-10-CM | POA: Diagnosis not present

## 2013-02-11 DIAGNOSIS — M6281 Muscle weakness (generalized): Secondary | ICD-10-CM | POA: Diagnosis not present

## 2013-02-11 DIAGNOSIS — IMO0001 Reserved for inherently not codable concepts without codable children: Secondary | ICD-10-CM | POA: Diagnosis not present

## 2013-02-11 DIAGNOSIS — Z471 Aftercare following joint replacement surgery: Secondary | ICD-10-CM | POA: Diagnosis not present

## 2013-02-11 DIAGNOSIS — Z96659 Presence of unspecified artificial knee joint: Secondary | ICD-10-CM | POA: Diagnosis not present

## 2013-02-20 NOTE — Discharge Summary (Signed)
Physician Discharge Summary   Patient ID: Mary [name] MRN: [id number] DOB/AGE: 08/17/1926 79 y.o.  Admit date: 01/24/2013 Discharge date: 01/26/2013  Primary Diagnosis:  Medial compartment osteoarthritis, Right knee  Admission Diagnoses:  Past Medical History  Diagnosis Date  . Heart palpitations   . Breast cancer 10/07    right; invasive ductal CA; Dr. Donnie Coffin  . Impaired fasting glucose   . Mixed hyperlipidemia   . Constipation   . External hemorrhoid   . Adenomatous colon polyp 3/03  . Colonic mass 2007    high grade dysplasia (s/p L hemicolectomy)  . BCC (basal cell carcinoma), face 8/06    L nasolabial fold  . CTS (carpal tunnel syndrome)     right  . Osteoporosis     DEXA 01/2009; T-3.2 L fem neck, -2.4 L radius   Discharge Diagnoses:   Principal Problem:   OA (osteoarthritis) of knee  Estimated body mass index is 23.03 kg/(m^2) as calculated from the following:   Height as of this encounter: 5\' 3"  (1.6 m).   Weight as of this encounter: 58.968 kg (130 lb).  Procedure:  Procedure(s) (LRB): RIGHT KNEE MEDIAL UNICOMPARTMENTAL ARTHROPLASTY (Right)   Consults: None  HPI: Ms. Mary [name] is a 79 y.o. female, who has  significant isolated medial compartment arthritis of the Right knee. She  has had nonoperative management including injections. She has had  cortisone and viscous supplements. Unfortunately, the pain persists.  Radiograph showed isolated medial compartment bone-on-bone arthritis  with normal-appearing patellofemoral and lateral compartments. She  presents now for left knee unicompartmental arthroplasty.   Laboratory Data: Admission on 01/24/2013, Discharged on 01/26/2013  Component Date Value Range Status  . WBC 01/25/2013 10.0  4.0 - 10.5 K/uL Final  . RBC 01/25/2013 4.22  3.87 - 5.11 MIL/uL Final  . Hemoglobin 01/25/2013 12.2  12.0 - 15.0 g/dL Final  . HCT 21/30/8657 36.7  36.0 - 46.0 % Final  . MCV 01/25/2013 87.0  78.0 - 100.0 fL Final  . MCH  01/25/2013 28.9  26.0 - 34.0 pg Final  . MCHC 01/25/2013 33.2  30.0 - 36.0 g/dL Final  . RDW 84/69/6295 14.9  11.5 - 15.5 % Final  . Platelets 01/25/2013 214  150 - 400 K/uL Final  . Sodium 01/25/2013 137  135 - 145 mEq/L Final  . Potassium 01/25/2013 3.9  3.5 - 5.1 mEq/L Final  . Chloride 01/25/2013 104  96 - 112 mEq/L Final  . CO2 01/25/2013 20  19 - 32 mEq/L Final  . Glucose, Bld 01/25/2013 144* 70 - 99 mg/dL Final  . BUN 28/41/3244 15  6 - 23 mg/dL Final  . Creatinine, Ser 01/25/2013 0.59  0.50 - 1.10 mg/dL Final  . Calcium 03/22/7251 8.5  8.4 - 10.5 mg/dL Final  . GFR calc non Af Amer 01/25/2013 82* >90 mL/min Final  . GFR calc Af Amer 01/25/2013 >90  >90 mL/min Final   Comment: (NOTE)                          The eGFR has been calculated using the CKD EPI equation.                          This calculation has not been validated in all clinical situations.                          eGFR's persistently <  90 mL/min signify possible Chronic Kidney                          Disease.  . WBC 01/26/2013 15.3* 4.0 - 10.5 K/uL Final  . RBC 01/26/2013 4.30  3.87 - 5.11 MIL/uL Final  . Hemoglobin 01/26/2013 12.3  12.0 - 15.0 g/dL Final  . HCT 16/12/9602 37.6  36.0 - 46.0 % Final  . MCV 01/26/2013 87.4  78.0 - 100.0 fL Final  . MCH 01/26/2013 28.6  26.0 - 34.0 pg Final  . MCHC 01/26/2013 32.7  30.0 - 36.0 g/dL Final  . RDW 54/11/8117 15.0  11.5 - 15.5 % Final  . Platelets 01/26/2013 248  150 - 400 K/uL Final  . Sodium 01/26/2013 137  135 - 145 mEq/L Final  . Potassium 01/26/2013 4.1  3.5 - 5.1 mEq/L Final  . Chloride 01/26/2013 102  96 - 112 mEq/L Final  . CO2 01/26/2013 25  19 - 32 mEq/L Final  . Glucose, Bld 01/26/2013 143* 70 - 99 mg/dL Final  . BUN 14/78/2956 22  6 - 23 mg/dL Final  . Creatinine, Ser 01/26/2013 0.67  0.50 - 1.10 mg/dL Final  . Calcium 21/30/8657 9.2  8.4 - 10.5 mg/dL Final  . GFR calc non Af Amer 01/26/2013 79* >90 mL/min Final  . GFR calc Af Amer 01/26/2013 >90  >90  mL/min Final   Comment: (NOTE)                          The eGFR has been calculated using the CKD EPI equation.                          This calculation has not been validated in all clinical situations.                          eGFR's persistently <90 mL/min signify possible Chronic Kidney                          Disease.  Hospital Outpatient Visit on 01/16/2013  Component Date Value Range Status  . aPTT 01/16/2013 29  24 - 37 seconds Final  . WBC 01/16/2013 6.9  4.0 - 10.5 K/uL Final  . RBC 01/16/2013 4.79  3.87 - 5.11 MIL/uL Final  . Hemoglobin 01/16/2013 13.9  12.0 - 15.0 g/dL Final  . HCT 84/69/6295 41.9  36.0 - 46.0 % Final  . MCV 01/16/2013 87.5  78.0 - 100.0 fL Final  . MCH 01/16/2013 29.0  26.0 - 34.0 pg Final  . MCHC 01/16/2013 33.2  30.0 - 36.0 g/dL Final  . RDW 28/41/3244 14.7  11.5 - 15.5 % Final  . Platelets 01/16/2013 244  150 - 400 K/uL Final  . Sodium 01/16/2013 140  135 - 145 mEq/L Final  . Potassium 01/16/2013 4.1  3.5 - 5.1 mEq/L Final  . Chloride 01/16/2013 102  96 - 112 mEq/L Final  . CO2 01/16/2013 29  19 - 32 mEq/L Final  . Glucose, Bld 01/16/2013 88  70 - 99 mg/dL Final  . BUN 03/22/7251 15  6 - 23 mg/dL Final  . Creatinine, Ser 01/16/2013 0.77  0.50 - 1.10 mg/dL Final  . Calcium 66/44/0347 10.9* 8.4 - 10.5 mg/dL Final  . Total Protein 01/16/2013 8.1  6.0 - 8.3 g/dL Final  .  Albumin 01/16/2013 4.4  3.5 - 5.2 g/dL Final  . AST 45/40/9811 18  0 - 37 U/L Final  . ALT 01/16/2013 21  0 - 35 U/L Final  . Alkaline Phosphatase 01/16/2013 77  39 - 117 U/L Final  . Total Bilirubin 01/16/2013 0.5  0.3 - 1.2 mg/dL Final  . GFR calc non Af Amer 01/16/2013 75* >90 mL/min Final  . GFR calc Af Amer 01/16/2013 87* >90 mL/min Final   Comment: (NOTE)                          The eGFR has been calculated using the CKD EPI equation.                          This calculation has not been validated in all clinical situations.                          eGFR's persistently <90  mL/min signify possible Chronic Kidney                          Disease.  Marland Kitchen Prothrombin Time 01/16/2013 13.0  11.6 - 15.2 seconds Final  . INR 01/16/2013 1.00  0.00 - 1.49 Final  . Color, Urine 01/16/2013 YELLOW  YELLOW Final  . APPearance 01/16/2013 CLEAR  CLEAR Final  . Specific Gravity, Urine 01/16/2013 1.014  1.005 - 1.030 Final  . pH 01/16/2013 7.0  5.0 - 8.0 Final  . Glucose, UA 01/16/2013 NEGATIVE  NEGATIVE mg/dL Final  . Hgb urine dipstick 01/16/2013 NEGATIVE  NEGATIVE Final  . Bilirubin Urine 01/16/2013 NEGATIVE  NEGATIVE Final  . Ketones, ur 01/16/2013 NEGATIVE  NEGATIVE mg/dL Final  . Protein, ur 91/47/8295 NEGATIVE  NEGATIVE mg/dL Final  . Urobilinogen, UA 01/16/2013 0.2  0.0 - 1.0 mg/dL Final  . Nitrite 62/13/0865 NEGATIVE  NEGATIVE Final  . Leukocytes, UA 01/16/2013 NEGATIVE  NEGATIVE Final   MICROSCOPIC NOT DONE ON URINES WITH NEGATIVE PROTEIN, BLOOD, LEUKOCYTES, NITRITE, OR GLUCOSE <1000 mg/dL.  Marland Kitchen MRSA, PCR 01/16/2013 NEGATIVE  NEGATIVE Final  . Staphylococcus aureus 01/16/2013 NEGATIVE  NEGATIVE Final   Comment:                                 The Xpert SA Assay (FDA                          approved for NASAL specimens                          in patients over 75 years of age),                          is one component of                          a comprehensive surveillance                          program.  Test performance has                          been  validated by [facility] for patients greater                          than or equal to 61 year old.                          It is not intended                          to diagnose infection nor to                          guide or monitor treatment.  . ABO/RH(D) 01/16/2013 A NEG   Final  . Antibody Screen 01/16/2013 NEG   Final  . Sample Expiration 01/16/2013 01/27/2013   Final  . ABO/RH(D) 01/16/2013 A NEG   Final  Abstract on 01/09/2013  Component Date Value Range Status  . HM  Mammogram 01/08/2013 negative   Final  . HM Dexa Scan 01/08/2013 osteoporosis   Final     X-Rays:No results found.  EKG: Orders placed during the hospital encounter of 01/24/13  . EKG     Hospital Course: Gianina Olinde is a 79 y.o. who was admitted to [facility]. They were brought to the operating room on 01/24/2013 and underwent Procedure(s): RIGHT KNEE MEDIAL UNICOMPARTMENTAL ARTHROPLASTY.  Patient tolerated the procedure well and was later transferred to the recovery room and then to the orthopaedic floor for postoperative care.  They were given PO and IV analgesics for pain control following their surgery.  They were given 24 hours of postoperative antibiotics of  Anti-infectives   Start     Dose/Rate Route Frequency Ordered Stop   01/24/13 2100  ceFAZolin (ANCEF) IVPB 1 g/50 mL premix     1 g 100 mL/hr over 30 Minutes Intravenous Every 6 hours 01/24/13 1842 01/25/13 0321   01/24/13 1400  ceFAZolin (ANCEF) IVPB 2 g/50 mL premix     2 g 100 mL/hr over 30 Minutes Intravenous On call to O.R. 01/24/13 1202 01/24/13 1515     and started on DVT prophylaxis in the form of Xarelto.   PT and OT were ordered for total joint protocol.  Discharge planning consulted to help with postop disposition and equipment needs.  Patient had a good night on the evening of surgery.  They started to get up OOB with therapy on day one. Hemovac drain was pulled without difficulty.  Continued to work with therapy into day two.  Dressing was changed on day two and the incision was healing well. Patient was seen in rounds and was ready to go home later that day.   Discharge Medications: Prior to Admission medications   Medication Sig Start Date End Date Taking? Authorizing Provider  atorvastatin (LIPITOR) 40 MG tablet Take 40 mg by mouth every evening.   Yes Historical Provider, MD  bethanechol (URECHOLINE) 25 MG tablet Take 25 mg by mouth 2 (two) times daily.    Yes Historical Provider, MD  Calcium  Carbonate-Vitamin D (CALCIUM 600+D) 600-400 MG-UNIT per tablet Take 1 tablet by mouth 2 (two) times daily.    Yes Historical Provider, MD  cetirizine (ZYRTEC) 10 MG tablet Take 10 mg by mouth as needed  for allergies.   Yes Historical Provider, MD  Cholecalciferol (VITAMIN D3) 2000 UNITS TABS Take 2,000 Units by mouth daily.   Yes Historical Provider, MD  digoxin (LANOXIN) 0.125 MG tablet Take 0.125 mg by mouth every morning.   Yes Historical Provider, MD  loratadine (CLARITIN) 10 MG tablet Take 10 mg by mouth daily.    Yes Historical Provider, MD  Multiple Vitamin (MULTIVITAMIN WITH MINERALS) TABS tablet Take 1 tablet by mouth daily.   Yes Historical Provider, MD  polyethylene glycol (MIRALAX / GLYCOLAX) packet Take 17 g by mouth daily.   Yes Historical Provider, MD  verapamil (CALAN-SR) 240 MG CR tablet Take 240 mg by mouth every morning.   Yes Historical Provider, MD  methocarbamol (ROBAXIN) 500 MG tablet Take 1 tablet (500 mg total) by mouth every 6 (six) hours as needed for muscle spasms. 01/25/13   Loanne Drilling, MD  oxyCODONE (OXY IR/ROXICODONE) 5 MG immediate release tablet Take 1 tablet (5 mg total) by mouth every 3 (three) hours as needed for breakthrough pain. 01/25/13   Loanne Drilling, MD  rivaroxaban (XARELTO) 10 MG TABS tablet Take 1 tablet (10 mg total) by mouth daily with breakfast. 01/25/13   Loanne Drilling, MD  traMADol (ULTRAM) 50 MG tablet Take 1-2 tablets (50-100 mg total) by mouth every 6 (six) hours as needed for moderate pain. 01/25/13   Loanne Drilling, MD    Activity:WBAT Follow-up:in 2 weeks Disposition - Home Discharged Condition: good        Future Appointments Provider Department Dept Phone   04/02/2013 9:45 AM Joselyn Arrow, MD [facility] Medicine [phone]   10/14/2013 10:00 AM Krista [facility] MEDICAL ONCOLOGY [phone]   10/14/2013 10:30 AM Lowella Dell, MD [facility] MEDICAL ONCOLOGY [phone]   10/14/2013  11:15 AM [facility] MED[facility] ONCOL[facility] [phone]       Medication List         atorvastatin 40 MG tablet  Commonly known as:  LIPITOR  Take 40 mg by mouth every evening.     bethanechol 25 MG tablet  Commonly known as:  URECHOLINE  Take 25 mg by mouth 2 (two) times daily.     CALCIUM 600+D 600-400 MG-UNIT per tablet  Generic drug:  Calcium Carbonate-Vitamin D  Take 1 tablet by mouth 2 (two) times daily.     cetirizine 10 MG tablet  Commonly known as:  ZYRTEC  Take 10 mg by mouth as needed for allergies.     CLARITIN 10 MG tablet  Generic drug:  loratadine  Take 10 mg by mouth daily.     digoxin 0.125 MG tablet  Commonly known as:  LANOXIN  Take 0.125 mg by mouth every morning.     methocarbamol 500 MG tablet  Commonly known as:  ROBAXIN  Take 1 tablet (500 mg total) by mouth every 6 (six) hours as needed for muscle spasms.     multivitamin with minerals Tabs tablet  Take 1 tablet by mouth daily.     oxyCODONE 5 MG immediate release tablet  Commonly known as:  Oxy IR/ROXICODONE  Take 1 tablet (5 mg total) by mouth every 3 (three) hours as needed for breakthrough pain.     polyethylene glycol packet  Commonly known as:  MIRALAX / GLYCOLAX  Take 17 g by mouth daily.     rivaroxaban 10 MG Tabs tablet  Commonly known as:  XARELTO  Take  1 tablet (10 mg total) by mouth daily with breakfast.     traMADol 50 MG tablet  Commonly known as:  ULTRAM  Take 1-2 tablets (50-100 mg total) by mouth every 6 (six) hours as needed for moderate pain.     verapamil 240 MG CR tablet  Commonly known as:  CALAN-SR  Take 240 mg by mouth every morning.     Vitamin D3 2000 UNITS Tabs  Take 2,000 Units by mouth daily.       Follow-up Information   Follow up with Loanne Drilling, MD. Schedule an appointment as soon as possible for a visit on 02/06/2013. (Call [phone] Monday to make the appointment)    Specialty:  Orthopedic Surgery   Contact  information:   [address] [place] [address] [phone]       Follow up with [facility]. (Home Health Physcial Therapy)    Contact information:   [phone]      Signed: Patrica Duel 02/20/2013, 1:38 PM

## 2013-02-22 ENCOUNTER — Emergency Department (HOSPITAL_COMMUNITY)
Admission: EM | Admit: 2013-02-22 | Discharge: 2013-02-22 | Disposition: A | Discharge status: Home / Self Care | Payer: Medicare Other | Attending: Emergency Medicine | Admitting: Emergency Medicine

## 2013-02-22 ENCOUNTER — Encounter (HOSPITAL_COMMUNITY): Payer: Self-pay | Admitting: Emergency Medicine

## 2013-02-22 DIAGNOSIS — N949 Unspecified condition associated with female genital organs and menstrual cycle: Secondary | ICD-10-CM | POA: Insufficient documentation

## 2013-02-22 DIAGNOSIS — Z7901 Long term (current) use of anticoagulants: Secondary | ICD-10-CM | POA: Insufficient documentation

## 2013-02-22 DIAGNOSIS — Z8719 Personal history of other diseases of the digestive system: Secondary | ICD-10-CM | POA: Diagnosis not present

## 2013-02-22 DIAGNOSIS — Z85828 Personal history of other malignant neoplasm of skin: Secondary | ICD-10-CM | POA: Diagnosis not present

## 2013-02-22 DIAGNOSIS — E782 Mixed hyperlipidemia: Secondary | ICD-10-CM | POA: Insufficient documentation

## 2013-02-22 DIAGNOSIS — Z860100 Personal history of colon polyps, unspecified: Secondary | ICD-10-CM | POA: Insufficient documentation

## 2013-02-22 DIAGNOSIS — R339 Retention of urine, unspecified: Secondary | ICD-10-CM

## 2013-02-22 DIAGNOSIS — Z9889 Other specified postprocedural states: Secondary | ICD-10-CM | POA: Diagnosis not present

## 2013-02-22 DIAGNOSIS — M810 Age-related osteoporosis without current pathological fracture: Secondary | ICD-10-CM | POA: Insufficient documentation

## 2013-02-22 DIAGNOSIS — Z79899 Other long term (current) drug therapy: Secondary | ICD-10-CM | POA: Insufficient documentation

## 2013-02-22 DIAGNOSIS — Z8669 Personal history of other diseases of the nervous system and sense organs: Secondary | ICD-10-CM | POA: Diagnosis not present

## 2013-02-22 DIAGNOSIS — Z9089 Acquired absence of other organs: Secondary | ICD-10-CM | POA: Insufficient documentation

## 2013-02-22 DIAGNOSIS — Z853 Personal history of malignant neoplasm of breast: Secondary | ICD-10-CM | POA: Insufficient documentation

## 2013-02-22 DIAGNOSIS — Z880 Allergy status to penicillin: Secondary | ICD-10-CM | POA: Diagnosis not present

## 2013-02-22 DIAGNOSIS — Z8679 Personal history of other diseases of the circulatory system: Secondary | ICD-10-CM | POA: Diagnosis not present

## 2013-02-22 LAB — URINALYSIS, ROUTINE W REFLEX MICROSCOPIC
Bilirubin Urine: NEGATIVE
Glucose, UA: NEGATIVE mg/dL
Ketones, ur: NEGATIVE mg/dL
Nitrite: NEGATIVE
Specific Gravity, Urine: 1.008 (ref 1.005–1.030)
pH: 7 (ref 5.0–8.0)

## 2013-02-22 LAB — URINE MICROSCOPIC-ADD ON

## 2013-02-22 NOTE — ED Notes (Signed)
Wickline, MD at bedside.  

## 2013-02-22 NOTE — ED Notes (Signed)
According to the bladder scanner, the pt has 948 mL in her bladder.

## 2013-02-22 NOTE — ED Provider Notes (Signed)
CSN: [id number]     Arrival date & time 02/22/13  0403 History   First MD Initiated Contact with Patient 02/22/13 0424     Chief Complaint  Patient presents with  . Pelvic Pain  . Dysuria    Patient is a 79 y.o. female presenting with pelvic pain and dysuria. The history is provided by the patient.  Pelvic Pain This is a new problem. The current episode started 6 to 12 hours ago. The problem occurs constantly. The problem has been gradually worsening. Associated symptoms include abdominal pain. Nothing aggravates the symptoms. Nothing relieves the symptoms.  Dysuria Associated symptoms: abdominal pain   Associated symptoms: no fever   pt reports that she has not voided since last night She reports she has urge to urinate but could not pass urine She reports she has had this before and placing a foley improved her symptoms No fever/vomiting No new back pain  no new weakness   Past Medical History  Diagnosis Date  . Heart palpitations   . Breast cancer 10/07    right; invasive ductal CA; Dr. Donnie Coffin  . Impaired fasting glucose   . Mixed hyperlipidemia   . Constipation   . External hemorrhoid   . Adenomatous colon polyp 3/03  . Colonic mass 2007    high grade dysplasia (s/p L hemicolectomy)  . BCC (basal cell carcinoma), face 8/06    L nasolabial fold  . CTS (carpal tunnel syndrome)     right  . Osteoporosis     DEXA 01/2009; T-3.2 L fem neck, -2.4 L radius   Past Surgical History  Procedure Laterality Date  . Mastectomy  12/07    RIGHT BREAST  . Colon surgery  6/07    high grade dysplasia; neg LN; lap-assisted surgery  . Appendectomy  age 65  . Tonsillectomy  age 45  . Cataract extraction, bilateral  R '93; L '91  . Carpal tunnel release  2/09    Right; Dr. Mina Marble  . Trigger finger release  R 2/09; L 5/09    Dr. Mina Marble  . Colonoscopy  05/20/2008    repeat due 05/2011; pt states she had in 2013 ([place])  . Partial knee arthroplasty Right 01/24/2013    Procedure:  RIGHT KNEE MEDIAL UNICOMPARTMENTAL ARTHROPLASTY;  Surgeon: Loanne Drilling, MD;  Location: [facility];  Service: Orthopedics;  Laterality: Right;   Family History  Problem Relation Age of Onset  . Hypertension Mother   . Stroke Father   . Hyperlipidemia Brother   . Breast cancer Maternal Aunt   . Breast cancer Paternal Aunt   . Breast cancer Cousin   . Colon cancer Cousin   . Breast cancer Cousin    History  Substance Use Topics  . Smoking status: Never Smoker   . Smokeless tobacco: Never Used  . Alcohol Use: Yes     Comment: Very seldom   OB History   Grav Para Term Preterm Abortions TAB SAB Ect Mult Living   3 3             Review of Systems  Constitutional: Negative for fever.  Gastrointestinal: Positive for abdominal pain.  Genitourinary: Positive for dysuria and pelvic pain.  All other systems reviewed and are negative.    Allergies  Avelox; Biaxin; Clarithromycin; Arimidex; Bactrim; Penicillins; and Sulfa drugs cross reactors  Home Medications   Current Outpatient Rx  Name  Route  Sig  Dispense  Refill  . atorvastatin (LIPITOR) 40 MG tablet  Oral   Take 40 mg by mouth every evening.         . bethanechol (URECHOLINE) 25 MG tablet   Oral   Take 25 mg by mouth 2 (two) times daily.          . Calcium Carbonate-Vitamin D (CALCIUM 600+D) 600-400 MG-UNIT per tablet   Oral   Take 1 tablet by mouth 2 (two) times daily.          . cetirizine (ZYRTEC) 10 MG tablet   Oral   Take 10 mg by mouth as needed for allergies.         . Cholecalciferol (VITAMIN D3) 2000 UNITS TABS   Oral   Take 2,000 Units by mouth daily.         . digoxin (LANOXIN) 0.125 MG tablet   Oral   Take 0.125 mg by mouth every morning.         . loratadine (CLARITIN) 10 MG tablet   Oral   Take 10 mg by mouth daily.          . methocarbamol (ROBAXIN) 500 MG tablet   Oral   Take 1 tablet (500 mg total) by mouth every 6 (six) hours as needed for muscle spasms.   50 tablet    1   . Multiple Vitamin (MULTIVITAMIN WITH MINERALS) TABS tablet   Oral   Take 1 tablet by mouth daily.         Marland Kitchen oxyCODONE (OXY IR/ROXICODONE) 5 MG immediate release tablet   Oral   Take 1 tablet (5 mg total) by mouth every 3 (three) hours as needed for breakthrough pain.   60 tablet   0   . polyethylene glycol (MIRALAX / GLYCOLAX) packet   Oral   Take 17 g by mouth daily.         . rivaroxaban (XARELTO) 10 MG TABS tablet   Oral   Take 1 tablet (10 mg total) by mouth daily with breakfast.   10 tablet   0   . traMADol (ULTRAM) 50 MG tablet   Oral   Take 1-2 tablets (50-100 mg total) by mouth every 6 (six) hours as needed for moderate pain.   80 tablet   1   . verapamil (CALAN-SR) 240 MG CR tablet   Oral   Take 240 mg by mouth every morning.          BP 193/96  Pulse 93  Resp 26  SpO2 98% Temp - 97 Physical Exam CONSTITUTIONAL: Well developed/well nourished HEAD: Normocephalic/atraumatic EYES: EOMI/PERRL ENMT: Mucous membranes moist NECK: supple no meningeal signs SPINE:entire spine nontender CV: S1/S2 noted, no murmurs/rubs/gallops noted LUNGS: Lungs are clear to auscultation bilaterally, no apparent distress ABDOMEN: soft, nontender, no rebound or guarding GU:no cva tenderness NEURO: Pt is awake/alert, moves all extremitiesx4 EXTREMITIES: pulses normal, full ROM SKIN: warm, color normal PSYCH: no abnormalities of mood noted  ED Course  Procedures (including critical care time) Labs Review Labs Reviewed  URINALYSIS, ROUTINE W REFLEX MICROSCOPIC - Abnormal; Notable for the following:    Hgb urine dipstick SMALL (*)    All other components within normal limits  URINE CULTURE  URINE MICROSCOPIC-ADD ON   Imaging Review No results found.  EKG Interpretation   None       MDM  No diagnosis found. Nursing notes including past medical history and social history reviewed and considered in documentation Labs/vital reviewed and considered   Pt  monitored in the ED She has >1073ml  of urine after foley placed She requested foley bag to be removed as she feels improved She reports that last time this occurred she did well after foley was removed I told her of possibility of recurrent retention and need for repeat evaluation Pt otherwise stable/safe for d/c home  Joya Gaskins, MD 02/22/13 [phone]

## 2013-02-22 NOTE — ED Notes (Signed)
Pt states that she has been unable to void since noon today.

## 2013-02-23 LAB — URINE CULTURE
Colony Count: NO GROWTH
Culture: NO GROWTH

## 2013-02-25 ENCOUNTER — Ambulatory Visit: Payer: Medicare Other | Admitting: Family

## 2013-02-28 DIAGNOSIS — M25569 Pain in unspecified knee: Secondary | ICD-10-CM | POA: Diagnosis not present

## 2013-03-03 DIAGNOSIS — N312 Flaccid neuropathic bladder, not elsewhere classified: Secondary | ICD-10-CM | POA: Diagnosis not present

## 2013-03-03 DIAGNOSIS — R279 Unspecified lack of coordination: Secondary | ICD-10-CM | POA: Diagnosis not present

## 2013-03-03 DIAGNOSIS — M6281 Muscle weakness (generalized): Secondary | ICD-10-CM | POA: Diagnosis not present

## 2013-03-03 DIAGNOSIS — R339 Retention of urine, unspecified: Secondary | ICD-10-CM | POA: Diagnosis not present

## 2013-03-04 DIAGNOSIS — M25569 Pain in unspecified knee: Secondary | ICD-10-CM | POA: Diagnosis not present

## 2013-03-06 ENCOUNTER — Telehealth: Payer: Self-pay | Admitting: Family Medicine

## 2013-03-06 NOTE — Telephone Encounter (Signed)
We need to try and get prior authorization for her to stay on branded med.  Please start

## 2013-03-07 DIAGNOSIS — M25569 Pain in unspecified knee: Secondary | ICD-10-CM | POA: Diagnosis not present

## 2013-03-11 ENCOUNTER — Telehealth: Payer: Self-pay | Admitting: Family Medicine

## 2013-03-11 DIAGNOSIS — M25569 Pain in unspecified knee: Secondary | ICD-10-CM | POA: Diagnosis not present

## 2013-03-17 DIAGNOSIS — M25569 Pain in unspecified knee: Secondary | ICD-10-CM | POA: Diagnosis not present

## 2013-03-17 NOTE — Telephone Encounter (Signed)
lm

## 2013-03-24 DIAGNOSIS — M25569 Pain in unspecified knee: Secondary | ICD-10-CM | POA: Diagnosis not present

## 2013-03-27 DIAGNOSIS — R279 Unspecified lack of coordination: Secondary | ICD-10-CM | POA: Diagnosis not present

## 2013-03-27 DIAGNOSIS — M6281 Muscle weakness (generalized): Secondary | ICD-10-CM | POA: Diagnosis not present

## 2013-03-27 DIAGNOSIS — N312 Flaccid neuropathic bladder, not elsewhere classified: Secondary | ICD-10-CM | POA: Diagnosis not present

## 2013-03-27 DIAGNOSIS — N3644 Muscular disorders of urethra: Secondary | ICD-10-CM | POA: Diagnosis not present

## 2013-03-28 DIAGNOSIS — M25569 Pain in unspecified knee: Secondary | ICD-10-CM | POA: Diagnosis not present

## 2013-04-02 ENCOUNTER — Telehealth: Payer: Self-pay | Admitting: Family Medicine

## 2013-04-02 ENCOUNTER — Ambulatory Visit: Payer: Medicare Other | Admitting: Family Medicine

## 2013-04-02 NOTE — Telephone Encounter (Signed)
Pt wanted to come in early and do labs, and Dr Tomi Bamberger advised that she could do that tomorrow.  I called pt and left message.

## 2013-04-02 NOTE — Telephone Encounter (Signed)
Pt calls and said she can't come in today due to the icy roads.  She wants to know if she can come in an do the labs only this week and reschedule her AWV.  She was offered tomorrow at 11:00 and she cannot fast that long.  Please advise pt if she can do labs a different day  Pt ph [phone]

## 2013-04-02 NOTE — Telephone Encounter (Signed)
Go ahead and work that out.

## 2013-04-03 ENCOUNTER — Encounter: Payer: Self-pay | Admitting: Family Medicine

## 2013-04-03 ENCOUNTER — Ambulatory Visit (INDEPENDENT_AMBULATORY_CARE_PROVIDER_SITE_OTHER): Payer: Medicare Other | Admitting: Family Medicine

## 2013-04-03 VITALS — BP 122/78 | HR 84 | Ht 62.25 in | Wt 125.0 lb

## 2013-04-03 DIAGNOSIS — E7800 Pure hypercholesterolemia, unspecified: Secondary | ICD-10-CM | POA: Diagnosis not present

## 2013-04-03 DIAGNOSIS — M1710 Unilateral primary osteoarthritis, unspecified knee: Secondary | ICD-10-CM

## 2013-04-03 DIAGNOSIS — E782 Mixed hyperlipidemia: Secondary | ICD-10-CM

## 2013-04-03 DIAGNOSIS — IMO0002 Reserved for concepts with insufficient information to code with codable children: Secondary | ICD-10-CM

## 2013-04-03 DIAGNOSIS — M179 Osteoarthritis of knee, unspecified: Secondary | ICD-10-CM

## 2013-04-03 DIAGNOSIS — Z79899 Other long term (current) drug therapy: Secondary | ICD-10-CM

## 2013-04-03 DIAGNOSIS — Z0000 Encounter for general adult medical examination without abnormal findings: Secondary | ICD-10-CM | POA: Diagnosis not present

## 2013-04-03 DIAGNOSIS — I10 Essential (primary) hypertension: Secondary | ICD-10-CM | POA: Diagnosis not present

## 2013-04-03 DIAGNOSIS — R002 Palpitations: Secondary | ICD-10-CM

## 2013-04-03 DIAGNOSIS — M810 Age-related osteoporosis without current pathological fracture: Secondary | ICD-10-CM

## 2013-04-03 NOTE — Patient Instructions (Signed)
  HEALTH MAINTENANCE RECOMMENDATIONS:  It is recommended that you get at least 30 minutes of aerobic exercise at least 5 days/week (for weight loss, you may need as much as 60-90 minutes). This can be any activity that gets your heart rate up. This can be divided in 10-15 minute intervals if needed, but try and build up your endurance at least once a week.  Weight bearing exercise is also recommended twice weekly.  Eat a healthy diet with lots of vegetables, fruits and fiber.  "Colorful" foods have a lot of vitamins (ie green vegetables, tomatoes, red peppers, etc).  Limit sweet tea, regular sodas and alcoholic beverages, all of which has a lot of calories and sugar.  Up to 1 alcoholic drink daily may be beneficial for women (unless trying to lose weight, watch sugars).  Drink a lot of water.  Calcium recommendations are 1200-1500 mg daily (1500 mg for postmenopausal women or women without ovaries), and vitamin D 1000 IU daily.  This should be obtained from diet and/or supplements (vitamins), and calcium should not be taken all at once, but in divided doses.  Monthly self breast exams and yearly mammograms for women over the age of 90 is recommended.  Sunscreen of at least SPF 30 should be used on all sun-exposed parts of the skin when outside between the hours of 10 am and 4 pm (not just when at beach or pool, but even with exercise, golf, tennis, and yard work!)  Use a sunscreen that says "broad spectrum" so it covers both UVA and UVB rays, and make sure to reapply every 1-2 hours.  Remember to change the batteries in your smoke detectors when changing your clock times in the spring and fall.  Use your seat belt every time you are in a car, and please drive safely and not be distracted with cell phones and texting while driving.  I recommend shingles vaccine, as discussed.  You were given a prescription to take to the pharmacy.  You can get this at your convenience, but needs to be at least 1 month  separate from any other vaccine.

## 2013-04-03 NOTE — Progress Notes (Addendum)
Chief Complaint  Patient presents with  . AWV    nonfasting with pelvic, labs have been drawn and are in the lab. All meds reconciled. No concerns today.   Mary [name] is a 81 y.o. female who presents for a complete physical.  She has the following concerns:  She was started on prolia injections by Dr. [name] in August 2014, had her first injection then without problems, for treatment of her osteoporosis.  She is taking calcium and vitamin D, but exercise has been limited since recent knee surgery.  Hypertension and palpitation follow-up: Blood pressures are not checked elsewhere. Denies dizziness, headaches, chest pain. Denies side effects of medications. Palpitations are well controlled. She got prior auth to remain on the brand Calan.  Hyperlipidemia follow-up: Patient is reportedly following a low-fat, low cholesterol diet. Compliant with medications and denies medication side effects   Dr. Delman Cheadle is treating her for alopecia with injections and topical steroids--she had some recurrence of hair loss after stopping using the medication for a while after surgery, but is improving again.  AWV: Other physicians caring for patient include: Dr. Diamantina Monks Dr. Geronimo Running Dr. Olga Coaster (just prn, for cerumen removal) Dr. Lutricia Horsfall Dr. Mare Ferrari Dr. Magrinat--oncologist Dr. Belinda Block  Depression screen:  See questionnaire--had decreased appetite just after surgery, but improved now.  FAST questionnaire (ADL screen)--see scanned form.  Uses cane sometimes.  Independent with all activities End of Life issues:  She has a healthcare power of attorney and living will.   Immunization History  Administered Date(s) Administered  . Influenza Split 12/01/2010, 01/09/2012, 12/18/2012  . Pneumococcal Polysaccharide-23 12/19/2002, 09/04/2011  . Td 11/19/2003  . Tdap 09/04/2011   Last Pap smear: 2013 Last mammogram: 12/2012 Last colonoscopy: 2013 Last DEXA:  12/2012 Dentist: twice yearly Ophtho: yearly Exercise:  She is in PT for her knee.  Hasn't otherwise been getting any exercise.  Past Medical History  Diagnosis Date  . Heart palpitations   . Breast cancer 10/07    right; invasive ductal CA; Dr. Truddie Coco  . Impaired fasting glucose   . Mixed hyperlipidemia   . Constipation   . External hemorrhoid   . Adenomatous colon polyp 3/03  . Colonic mass 2007    high grade dysplasia (s/p L hemicolectomy)  . BCC (basal cell carcinoma), face 8/06    L nasolabial fold  . CTS (carpal tunnel syndrome)     right  . Osteoporosis     DEXA 01/2009; T-3.2 L fem neck, -2.4 L radius    Past Surgical History  Procedure Laterality Date  . Mastectomy  12/07    RIGHT BREAST  . Colon surgery  6/07    high grade dysplasia; neg LN; lap-assisted surgery  . Appendectomy  age 89  . Tonsillectomy  age 8  . Cataract extraction, bilateral  R '93; L '91  . Carpal tunnel release  2/09    Right; Dr. Burney Gauze  . Trigger finger release  R 2/09; L 5/09    Dr. Burney Gauze  . Colonoscopy  05/20/2008    repeat due 05/2011; pt states she had in 2013 ([place])  . Partial knee arthroplasty Right 01/24/2013    Procedure: RIGHT KNEE MEDIAL UNICOMPARTMENTAL ARTHROPLASTY;  Surgeon: Gearlean Alf, MD;  Location: [facility];  Service: Orthopedics;  Laterality: Right;    History   Social History  . Marital Status: Widowed    Spouse Name: N/A    Number of Children: 3  . Years of Education: N/A   Occupational History  .  Not on file.   Social History Main Topics  . Smoking status: Never Smoker   . Smokeless tobacco: Never Used  . Alcohol Use: Yes     Comment: Very seldom  . Drug Use: No  . Sexual Activity: No   Other Topics Concern  . Not on file   Social History Narrative   Children live in [place]. She has 1 son and 2 daughters; brother and nephew live in [place]. Widowed '94    Family History  Problem Relation Age of Onset  . Hypertension Mother   .  Stroke Father   . Diabetes Father   . Hyperlipidemia Brother   . Breast cancer Maternal Aunt   . Breast cancer Paternal Aunt   . Breast cancer Cousin   . Colon cancer Cousin   . Breast cancer Cousin   . Hyperlipidemia Son     Current outpatient prescriptions:atorvastatin (LIPITOR) 40 MG tablet, Take 40 mg by mouth every evening., Disp: , Rfl: ;  bethanechol (URECHOLINE) 25 MG tablet, Take 25 mg by mouth 2 (two) times daily. , Disp: , Rfl: ;  Calcium Carbonate-Vitamin D (CALCIUM 600+D) 600-400 MG-UNIT per tablet, Take 1 tablet by mouth 2 (two) times daily. , Disp: , Rfl: ;  Cholecalciferol (VITAMIN D3) 2000 UNITS TABS, Take 2,000 Units by mouth daily., Disp: , Rfl:  digoxin (LANOXIN) 0.125 MG tablet, Take 0.125 mg by mouth every morning., Disp: , Rfl: ;  Multiple Vitamin (MULTIVITAMIN WITH MINERALS) TABS tablet, Take 1 tablet by mouth daily., Disp: , Rfl: ;  verapamil (CALAN-SR) 240 MG CR tablet, Take 240 mg by mouth every morning., Disp: , Rfl: ;  cetirizine (ZYRTEC) 10 MG tablet, Take 10 mg by mouth as needed for allergies., Disp: , Rfl:  denosumab (PROLIA) 60 MG/ML SOLN injection, Inject 60 mg into the skin every 6 (six) months. Administer in upper arm, thigh, or abdomen, Disp: , Rfl: ;  polyethylene glycol (MIRALAX / GLYCOLAX) packet, Take 17 g by mouth daily., Disp: , Rfl:   Allergies  Allergen Reactions  . Avelox [Moxifloxacin Hcl In Nacl] Nausea Only  . Biaxin [Clarithromycin]   . Clarithromycin Itching and Nausea Only  . Arimidex [Anastrozole] Rash  . Bactrim Nausea Only and Rash  . Penicillins Nausea Only and Rash  . Sulfa Drugs Cross Reactors Nausea And Vomiting and Rash   ROS:  The patient denies anorexia, fever, headaches,  vision changes, decreased hearing, ear pain, sore throat, breast concerns, chest pain, palpitations, dizziness, syncope, dyspnea on exertion, cough, swelling, nausea, vomiting, diarrhea, abdominal pain, melena, hematochezia, indigestion/heartburn, hematuria,  dysuria, vaginal bleeding, discharge, odor or itch, genital lesions, joint pains (other than knee), numbness, tingling, weakness, tremor, suspicious skin lesions, depression, anxiety, abnormal bleeding/bruising, or enlarged lymph nodes.  Slight weight loss after surgery, but now appetite is better. Very intermittent dysphagia--feels like a lump if she drinks very cold water; sometimes with beef Constipation occasionallly, overall doing well Slightly depressed after surgery x 2 weeks, better now. Sleeps well, up 2-3x to void. R knee pain--improved after surgery. Alopecia--being actively treated by dermatologist  PHYSICAL EXAM: BP 122/78  Pulse 84  Ht 5' 2.25" (1.581 m)  Wt 125 lb (56.7 kg)  BMI 22.68 kg/m2   General Appearance:    Alert, cooperative, no distress, appears somewhat younger than stated age  Head:    Normocephalic, without obvious abnormality, atraumatic  Eyes:    PERRL, conjunctiva/corneas clear, EOM's intact, fundi    benign  Ears:  Normal external ear canals; TM's occluded by cerumen bilaterally  Nose:   Nares normal, mucosa normal, no drainage or sinus   tenderness  Throat:   Lips, mucosa, and tongue normal; teeth and gums normal. +torus pallatini  Neck:   Supple, no lymphadenopathy;  thyroid:  no   enlargement/tenderness/nodules; no carotid   bruit or JVD  Back:    Spine nontender, no curvature, ROM normal, no CVA     tenderness  Lungs:     Clear to auscultation bilaterally without wheezes, rales or     ronchi; respirations unlabored  Chest Wall:    No tenderness or deformity   Heart:    Regular rate and rhythm, S1 and S2 normal, no murmur, rub   or gallop  Breast Exam:    No tenderness, masses, or nipple discharge or inversion of left breast.  Right breast is absent, WHSS, no mass. No axillary lymphadenopathy  Abdomen:     Soft, non-tender, nondistended, normoactive bowel sounds,    no masses, no hepatosplenomegaly.  +WHSS.  +small umbilical hernia, easily  reducible, nontender  Genitalia:    Normal external genitalia without lesions.  BUS and vagina normal; cervix without lesions, or cervical motion tenderness. No abnormal vaginal discharge.  Uterus and adnexa not enlarged, nontender, no masses.  Pap performed  Rectal:    Normal tone, no masses or tenderness; guaiac negative stool  Extremities:   No clubbing, cyanosis or edema. WHSS at right knee  Pulses:   2+ and symmetric all extremities  Skin:   Skin color, texture, turgor normal, no rashes or lesions  Lymph nodes:   Cervical, supraclavicular, and axillary nodes normal  Neurologic:   CNII-XII intact, normal strength, sensation and gait; reflexes 2+ and symmetric throughout          Psych:   Normal mood, affect, hygiene and grooming.    ASSESSMENT/PLAN:  Medicare annual wellness visit, initial  Essential hypertension, benign - well controlled - Plan: Comprehensive metabolic panel  Pure hypercholesterolemia - Plan: Comprehensive metabolic panel, Lipid panel  Encounter for long-term (current) use of other medications - Plan: Comprehensive metabolic panel, Lipid panel  Mixed hyperlipidemia  Palpitations - well controlled  Osteoporosis - now being treated with Prolia by oncologist  OA (osteoarthritis) of knee - s/p recent partial knee replacement.  pain is improved, sees Dr. Wynelle Link tomorrow for follow-up  Discussed monthly self breast exams and yearly mammograms; at least 30 minutes of aerobic activity at least 5 days/week (advance activity as per Dr. Wynelle Link); proper sunscreen use reviewed; healthy diet, including goals of calcium and vitamin D intake and alcohol recommendations (less than or equal to 1 drink/day) reviewed; regular seatbelt use; changing batteries in smoke detectors.  Immunization recommendations discussed, UTD.  Zostavax recommended.  Risks/benefits reviewed, prescription given.  Colonoscopy recommendations reviewed, UTD  End of life discussion:  She has appropriate  paperwork.  She is Full code, full care.  Has power of attorney and living will.  F/u 6 mos for med check

## 2013-04-04 ENCOUNTER — Encounter: Payer: Self-pay | Admitting: Family Medicine

## 2013-04-04 LAB — LIPID PANEL
CHOL/HDL RATIO: 3.3 ratio
Cholesterol: 219 mg/dL — ABNORMAL HIGH (ref 0–200)
HDL: 66 mg/dL (ref >39)
LDL Cholesterol: 111 mg/dL — ABNORMAL HIGH (ref 0–99)
TRIGLYCERIDES: 210 mg/dL — AB (ref <150)
VLDL: 42 mg/dL — AB (ref 0–40)

## 2013-04-04 LAB — COMPREHENSIVE METABOLIC PANEL
ALT: 16 U/L (ref 0–35)
AST: 15 U/L (ref 0–37)
Albumin: 4.3 g/dL (ref 3.5–5.2)
Alkaline Phosphatase: 61 U/L (ref 39–117)
BILIRUBIN TOTAL: 0.5 mg/dL (ref 0.3–1.2)
BUN: 13 mg/dL (ref 6–23)
CO2: 26 mEq/L (ref 19–32)
CREATININE: 0.65 mg/dL (ref 0.50–1.10)
Calcium: 9.8 mg/dL (ref 8.4–10.5)
Chloride: 103 mEq/L (ref 96–112)
GLUCOSE: 88 mg/dL (ref 70–99)
Potassium: 4 mEq/L (ref 3.5–5.3)
Sodium: 141 mEq/L (ref 135–145)
Total Protein: 6.9 g/dL (ref 6.0–8.3)

## 2013-04-10 DIAGNOSIS — N312 Flaccid neuropathic bladder, not elsewhere classified: Secondary | ICD-10-CM | POA: Diagnosis not present

## 2013-04-10 DIAGNOSIS — R279 Unspecified lack of coordination: Secondary | ICD-10-CM | POA: Diagnosis not present

## 2013-04-10 DIAGNOSIS — M6281 Muscle weakness (generalized): Secondary | ICD-10-CM | POA: Diagnosis not present

## 2013-04-15 DIAGNOSIS — L439 Lichen planus, unspecified: Secondary | ICD-10-CM | POA: Diagnosis not present

## 2013-04-16 DIAGNOSIS — M25569 Pain in unspecified knee: Secondary | ICD-10-CM | POA: Diagnosis not present

## 2013-04-16 DIAGNOSIS — Z96659 Presence of unspecified artificial knee joint: Secondary | ICD-10-CM | POA: Diagnosis not present

## 2013-04-16 DIAGNOSIS — Z471 Aftercare following joint replacement surgery: Secondary | ICD-10-CM | POA: Diagnosis not present

## 2013-04-24 DIAGNOSIS — M25569 Pain in unspecified knee: Secondary | ICD-10-CM | POA: Diagnosis not present

## 2013-04-28 ENCOUNTER — Other Ambulatory Visit: Payer: Self-pay | Admitting: Family Medicine

## 2013-05-01 DIAGNOSIS — N3644 Muscular disorders of urethra: Secondary | ICD-10-CM | POA: Diagnosis not present

## 2013-05-01 DIAGNOSIS — R279 Unspecified lack of coordination: Secondary | ICD-10-CM | POA: Diagnosis not present

## 2013-05-01 DIAGNOSIS — M6281 Muscle weakness (generalized): Secondary | ICD-10-CM | POA: Diagnosis not present

## 2013-05-01 DIAGNOSIS — N312 Flaccid neuropathic bladder, not elsewhere classified: Secondary | ICD-10-CM | POA: Diagnosis not present

## 2013-05-09 DIAGNOSIS — M25569 Pain in unspecified knee: Secondary | ICD-10-CM | POA: Diagnosis not present

## 2013-05-16 DIAGNOSIS — M25569 Pain in unspecified knee: Secondary | ICD-10-CM | POA: Diagnosis not present

## 2013-05-21 DIAGNOSIS — M25569 Pain in unspecified knee: Secondary | ICD-10-CM | POA: Diagnosis not present

## 2013-05-30 DIAGNOSIS — M25569 Pain in unspecified knee: Secondary | ICD-10-CM | POA: Diagnosis not present

## 2013-05-30 DIAGNOSIS — Z471 Aftercare following joint replacement surgery: Secondary | ICD-10-CM | POA: Diagnosis not present

## 2013-05-30 DIAGNOSIS — Z96659 Presence of unspecified artificial knee joint: Secondary | ICD-10-CM | POA: Diagnosis not present

## 2013-06-10 DIAGNOSIS — L439 Lichen planus, unspecified: Secondary | ICD-10-CM | POA: Diagnosis not present

## 2013-06-30 DIAGNOSIS — N312 Flaccid neuropathic bladder, not elsewhere classified: Secondary | ICD-10-CM | POA: Diagnosis not present

## 2013-07-25 DIAGNOSIS — Z96659 Presence of unspecified artificial knee joint: Secondary | ICD-10-CM | POA: Diagnosis not present

## 2013-07-25 DIAGNOSIS — Z471 Aftercare following joint replacement surgery: Secondary | ICD-10-CM | POA: Diagnosis not present

## 2013-07-29 DIAGNOSIS — L439 Lichen planus, unspecified: Secondary | ICD-10-CM | POA: Diagnosis not present

## 2013-08-05 ENCOUNTER — Telehealth: Payer: Self-pay | Admitting: Oncology

## 2013-08-05 NOTE — Telephone Encounter (Signed)
pt needed to r/s appt...done

## 2013-08-12 DIAGNOSIS — H6120 Impacted cerumen, unspecified ear: Secondary | ICD-10-CM | POA: Diagnosis not present

## 2013-09-16 DIAGNOSIS — Z96659 Presence of unspecified artificial knee joint: Secondary | ICD-10-CM | POA: Diagnosis not present

## 2013-09-16 DIAGNOSIS — Z471 Aftercare following joint replacement surgery: Secondary | ICD-10-CM | POA: Diagnosis not present

## 2013-09-29 ENCOUNTER — Other Ambulatory Visit: Payer: Self-pay | Admitting: *Deleted

## 2013-09-29 DIAGNOSIS — M810 Age-related osteoporosis without current pathological fracture: Secondary | ICD-10-CM

## 2013-09-29 DIAGNOSIS — Z853 Personal history of malignant neoplasm of breast: Secondary | ICD-10-CM

## 2013-09-30 ENCOUNTER — Ambulatory Visit (HOSPITAL_BASED_OUTPATIENT_CLINIC_OR_DEPARTMENT_OTHER): Payer: Medicare Other | Admitting: Oncology

## 2013-09-30 ENCOUNTER — Encounter: Payer: Self-pay | Admitting: *Deleted

## 2013-09-30 ENCOUNTER — Ambulatory Visit (HOSPITAL_BASED_OUTPATIENT_CLINIC_OR_DEPARTMENT_OTHER): Payer: Medicare Other

## 2013-09-30 ENCOUNTER — Telehealth: Payer: Self-pay | Admitting: Oncology

## 2013-09-30 ENCOUNTER — Other Ambulatory Visit (HOSPITAL_BASED_OUTPATIENT_CLINIC_OR_DEPARTMENT_OTHER): Payer: Medicare Other

## 2013-09-30 VITALS — BP 178/87 | HR 79 | Temp 97.7°F | Resp 20 | Ht 62.25 in | Wt 127.3 lb

## 2013-09-30 DIAGNOSIS — Z853 Personal history of malignant neoplasm of breast: Secondary | ICD-10-CM

## 2013-09-30 DIAGNOSIS — M810 Age-related osteoporosis without current pathological fracture: Secondary | ICD-10-CM

## 2013-09-30 LAB — COMPREHENSIVE METABOLIC PANEL (CC13)
ALT: 17 U/L (ref 0–55)
AST: 13 U/L (ref 5–34)
Albumin: 3.8 g/dL (ref 3.5–5.0)
Alkaline Phosphatase: 66 U/L (ref 40–150)
Anion Gap: 7 mEq/L (ref 3–11)
BUN: 18.5 mg/dL (ref 7.0–26.0)
CALCIUM: 9.9 mg/dL (ref 8.4–10.4)
CO2: 31 mEq/L — ABNORMAL HIGH (ref 22–29)
CREATININE: 0.8 mg/dL (ref 0.6–1.1)
Chloride: 104 mEq/L (ref 98–109)
Glucose: 91 mg/dl (ref 70–140)
Potassium: 4 mEq/L (ref 3.5–5.1)
Sodium: 143 mEq/L (ref 136–145)
Total Bilirubin: 0.48 mg/dL (ref 0.20–1.20)
Total Protein: 7.1 g/dL (ref 6.4–8.3)

## 2013-09-30 LAB — CBC WITH DIFFERENTIAL/PLATELET
BASO%: 0.7 % (ref 0.0–2.0)
BASOS ABS: 0.1 10*3/uL (ref 0.0–0.1)
EOS ABS: 0.2 10*3/uL (ref 0.0–0.5)
EOS%: 2.4 % (ref 0.0–7.0)
HEMATOCRIT: 39.4 % (ref 34.8–46.6)
HGB: 12.8 g/dL (ref 11.6–15.9)
LYMPH#: 1.7 10*3/uL (ref 0.9–3.3)
LYMPH%: 22.6 % (ref 14.0–49.7)
MCH: 28.6 pg (ref 25.1–34.0)
MCHC: 32.5 g/dL (ref 31.5–36.0)
MCV: 87.9 fL (ref 79.5–101.0)
MONO#: 0.6 10*3/uL (ref 0.1–0.9)
MONO%: 8.3 % (ref 0.0–14.0)
NEUT%: 66 % (ref 38.4–76.8)
NEUTROS ABS: 4.9 10*3/uL (ref 1.5–6.5)
Platelets: 217 10*3/uL (ref 145–400)
RBC: 4.48 10*6/uL (ref 3.70–5.45)
RDW: 14.8 % — ABNORMAL HIGH (ref 11.2–14.5)
WBC: 7.4 10*3/uL (ref 3.9–10.3)

## 2013-09-30 MED ORDER — DENOSUMAB 60 MG/ML ~~LOC~~ SOLN
60.0000 mg | Freq: Once | SUBCUTANEOUS | Status: AC
  Administered 2013-09-30: 60 mg via SUBCUTANEOUS
  Filled 2013-09-30: qty 1
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
## 2013-09-30 NOTE — Progress Notes (Signed)
Received verbal order from Dr. [name] to schedule pt for genetics.  Scheduled genetic appt and took to scheduling where pt was waiting.  Pt informed me that she would be out of town the week that I scheduled her.  Went ahead and rescheduled the pt for genetics for 8/14.  Also, scheduled pt for her f/u appt for lab, Dr. [name] & injection for one year from now on 10/05/14.  Printed schedule for pt.  Arrived pt for injection and went to make sure [facility] was where pt needed to be.  After confirming where pt needs to be, I took pt to the waiting for [facility] to get her.  Went to [employer] to make her aware so she can check for orders put in by Dr. [name] and verify that I scheduled everything correct.

## 2013-09-30 NOTE — Telephone Encounter (Signed)
per LM to ck sch-sch correct-GREAT JOB LM!

## 2013-10-02 ENCOUNTER — Encounter: Payer: PRIVATE HEALTH INSURANCE | Admitting: Family Medicine

## 2013-10-14 ENCOUNTER — Ambulatory Visit: Payer: Medicare Other | Admitting: Oncology

## 2013-10-14 ENCOUNTER — Other Ambulatory Visit: Payer: Medicare Other

## 2013-10-14 ENCOUNTER — Ambulatory Visit: Payer: Medicare Other

## 2013-10-16 ENCOUNTER — Other Ambulatory Visit: Payer: Medicare Other

## 2013-10-23 ENCOUNTER — Other Ambulatory Visit: Payer: Self-pay | Admitting: Family Medicine

## 2013-10-24 DIAGNOSIS — L439 Lichen planus, unspecified: Secondary | ICD-10-CM | POA: Diagnosis not present

## 2013-10-29 ENCOUNTER — Encounter: Payer: Self-pay | Admitting: Family Medicine

## 2013-10-29 ENCOUNTER — Ambulatory Visit (INDEPENDENT_AMBULATORY_CARE_PROVIDER_SITE_OTHER): Payer: Medicare Other | Admitting: Family Medicine

## 2013-10-29 VITALS — BP 138/80 | HR 64 | Ht 63.0 in | Wt 125.0 lb

## 2013-10-29 DIAGNOSIS — E782 Mixed hyperlipidemia: Secondary | ICD-10-CM | POA: Diagnosis not present

## 2013-10-29 DIAGNOSIS — R002 Palpitations: Secondary | ICD-10-CM | POA: Diagnosis not present

## 2013-10-29 DIAGNOSIS — I10 Essential (primary) hypertension: Secondary | ICD-10-CM | POA: Diagnosis not present

## 2013-10-29 MED ORDER — ATORVASTATIN CALCIUM 40 MG PO TABS: 40.0000 mg | ORAL_TABLET | Freq: Every evening | ORAL | Status: DC

## 2013-10-29 NOTE — Progress Notes (Signed)
Chief Complaint  Patient presents with  . Hypertension    nonfasting med check. No concerns. Patient will get flu vaccine end of Sept at [facility].     Hypertension and palpitation follow-up: Blood pressures are not checked elsewhere. It seems to fluctuate--sometimes higher, and other times is "perfect".  Denies dizziness, headaches, chest pain. Denies side effects of medications. Palpitations are well controlled.  Hyperlipidemia follow-up: Patient is reportedly following a low cholesterol diet. Compliant with medications and denies medication side effects.  TG were elevated on last check (January), and is due to have lipids repeated.  Nonfasting today. She has tried fish oil in the past, but she didn't like it (size of pills?).  She admits to eating a lot of sweets.  She has been eating creole cream cheese (made with skim milk, butter milk, and adds sweet and low).  Dr. Delman Cheadle has been treating her for alopecia with injections and topical steroids.  She went last week.  Not improving much, but not getting worse.  She was started on Prolia injections by Dr. [name] in July 2014--she is getting just once a year, and has had two injections so far for treatment of her osteoporosis. She is taking calcium and vitamin D.  Her knee is doing much better, and is walking much more than at last visit.  She hasn't restarted any regular exercise yet, but is hoping to start walking at the park.  She sees Dr. Wynelle Link later this month.  She is going for genetic testing next week (she apparently never had, for breast cancer).  Past Medical History  Diagnosis Date  . Heart palpitations   . Breast cancer 10/07    right; invasive ductal CA; Dr. Truddie Coco  . Impaired fasting glucose   . Mixed hyperlipidemia   . Constipation   . External hemorrhoid   . Adenomatous colon polyp 3/03  . Colonic mass 2007    high grade dysplasia (s/p L hemicolectomy)  . BCC (basal cell carcinoma), face 8/06    L nasolabial  fold  . CTS (carpal tunnel syndrome)     right  . Osteoporosis     DEXA 01/2009; T-3.2 L fem neck, -2.4 L radius   Past Surgical History  Procedure Laterality Date  . Mastectomy  12/07    RIGHT BREAST  . Colon surgery  6/07    high grade dysplasia; neg LN; lap-assisted surgery  . Appendectomy  age 60  . Tonsillectomy  age 40  . Cataract extraction, bilateral  R '93; L '91  . Carpal tunnel release  2/09    Right; Dr. Burney Gauze  . Trigger finger release  R 2/09; L 5/09    Dr. Burney Gauze  . Colonoscopy  05/20/2008    repeat due 05/2011; pt states she had in 2013 ([place])  . Partial knee arthroplasty Right 01/24/2013    Procedure: RIGHT KNEE MEDIAL UNICOMPARTMENTAL ARTHROPLASTY;  Surgeon: Gearlean Alf, MD;  Location: [facility];  Service: Orthopedics;  Laterality: Right;   History   Social History  . Marital Status: Widowed    Spouse Name: N/A    Number of Children: 3  . Years of Education: N/A   Occupational History  . Not on file.   Social History Main Topics  . Smoking status: Never Smoker   . Smokeless tobacco: Never Used  . Alcohol Use: Yes     Comment: Very seldom  . Drug Use: No  . Sexual Activity: No   Other Topics Concern  .  Not on file   Social History Narrative   Children live in [place]. She has 1 son and 2 daughters; brother and nephew live in [place]. Widowed '94   Outpatient Encounter Prescriptions as of 10/29/2013  Medication Sig Note  . atorvastatin (LIPITOR) 40 MG tablet Take 40 mg by mouth every evening.   . bethanechol (URECHOLINE) 25 MG tablet Take 25 mg by mouth 2 (two) times daily.  01/13/2013: -  . CALAN SR 240 MG CR tablet TAKE 1 TABLET BY MOUTH EVERY NIGHT AT BEDTIME   . Calcium Carbonate-Vitamin D (CALCIUM 600+D) 600-400 MG-UNIT per tablet Take 1 tablet by mouth 2 (two) times daily.    . Cholecalciferol (VITAMIN D3) 2000 UNITS TABS Take 2,000 Units by mouth daily.   Marland Kitchen denosumab (PROLIA) 60 MG/ML SOLN injection Inject 60 mg into the skin  every 6 (six) months. Administer in upper arm, thigh, or abdomen 04/03/2013: Started by Dr. [name], first dose 10/2012  . LANOXIN 125 MCG tablet TAKE 1 TABLET BY MOUTH EVERY DAY   . Multiple Vitamin (MULTIVITAMIN WITH MINERALS) TABS tablet Take 1 tablet by mouth daily.   . cetirizine (ZYRTEC) 10 MG tablet Take 10 mg by mouth as needed for allergies.   . polyethylene glycol (MIRALAX / GLYCOLAX) packet Take 17 g by mouth daily. 04/03/2013: Takes prn  . [DISCONTINUED] atorvastatin (LIPITOR) 40 MG tablet TAKE 1 TABLET BY MOUTH EVERY DAY   . [DISCONTINUED] digoxin (LANOXIN) 0.125 MG tablet Take 0.125 mg by mouth every morning.   . [DISCONTINUED] verapamil (CALAN-SR) 240 MG CR tablet Take 240 mg by mouth every morning.    Allergies  Allergen Reactions  . Avelox [Moxifloxacin Hcl In Nacl] Nausea Only  . Biaxin [Clarithromycin]   . Clarithromycin Itching and Nausea Only  . Arimidex [Anastrozole] Rash  . Bactrim Nausea Only and Rash  . Penicillins Nausea Only and Rash  . Sulfa Drugs Cross Reactors Nausea And Vomiting and Rash   ROS:  Denies fevers, chills, anorexia, headache, dizziness, chest pain, shortness of breath, edema, nausea, vomiting, bowel changes, urinary complaints (stable).  Denies bleeding/bruising/rash, mood changes or other concerns.  PHYSICAL EXAM: BP 148/82  Pulse 64  Ht 5\' 3"  (1.6 m)  Wt 125 lb (56.7 kg)  BMI 22.15 kg/m2 138/80 on repeat by MD, [place] Well developed, pleasant female in no distress HEENT:  PERRL, EOMI, conjunctiva clear Neck: no lymphadenopathy, thyromegaly or mass Heart: regular rate and rhythm, no ectopy or murmur Lungs: clear bilaterally Back: no CVA tenderness or spine tenderness Abdomen: soft, nontender, no organomegaly or mass Extremities: no edema, 2+ pulses Neuro: alert and oriented.  Cranial nerves intact. Normal strength, gait Psych: normal mood, affect, hygiene and grooming    Chemistry      Component Value Date/Time   NA 143 09/30/2013 0955    NA 141 04/03/2013 1345   K 4.0 09/30/2013 0955   K 4.0 04/03/2013 1345   CL 103 04/03/2013 1345   CL 102 03/01/2012 0912   CO2 31* 09/30/2013 0955   CO2 26 04/03/2013 1345   BUN 18.5 09/30/2013 0955   BUN 13 04/03/2013 1345   CREATININE 0.8 09/30/2013 0955   CREATININE 0.65 04/03/2013 1345   CREATININE 0.67 01/26/2013 0417      Component Value Date/Time   CALCIUM 9.9 09/30/2013 0955   CALCIUM 9.8 04/03/2013 1345   ALKPHOS 66 09/30/2013 0955   ALKPHOS 61 04/03/2013 1345   AST 13 09/30/2013 0955   AST 15 04/03/2013 1345  ALT 17 09/30/2013 0955   ALT 16 04/03/2013 1345   BILITOT 0.48 09/30/2013 0955   BILITOT 0.5 04/03/2013 1345     Glucose 91 (09/30/13)  Lab Results  Component Value Date   WBC 7.4 09/30/2013   HGB 12.8 09/30/2013   HCT 39.4 09/30/2013   MCV 87.9 09/30/2013   PLT 217 09/30/2013   Lab Results  Component Value Date   CHOL 219* 04/03/2013   HDL 66 04/03/2013   LDLCALC 111* 04/03/2013   TRIG 210* 04/03/2013   CHOLHDL 3.3 04/03/2013    ASSESSMENT/PLAN:  Mixed hyperlipidemia - since she isn't fasting, and has been eating sweets, will give 1-2 month dietary trial before rechecking lipid panel.  Reviewed lowfat diet - Plan: atorvastatin (LIPITOR) 40 MG tablet, Lipid panel  Essential hypertension, benign - controlled with some fluctuations.  monitor periodically.  low sodium diet, regular exercise  Palpitations - well controlled   Cut back on sweets in diet, eat more fruit/vegetables in diet. Return in 1-2 months for fasting labs, after dietary trial. Encouraged low sodium diet, and monitor BP elsewhere.  F/u in 6 months at [facility]/med check plus. Will need digoxin level, TSH. May need lipids rechecked, depending on results from next check

## 2013-10-29 NOTE — Patient Instructions (Signed)
Continue current medications. Cut back on sweets, sugar, avoid fried foods. Continue low cholesterol diet.  Return in 1-2 months for recheck of your lipid panel

## 2013-10-31 ENCOUNTER — Encounter: Payer: Self-pay | Admitting: Genetic Counselor

## 2013-10-31 ENCOUNTER — Other Ambulatory Visit: Payer: Medicare Other

## 2013-10-31 ENCOUNTER — Ambulatory Visit (HOSPITAL_BASED_OUTPATIENT_CLINIC_OR_DEPARTMENT_OTHER): Payer: Medicare Other | Admitting: Genetic Counselor

## 2013-10-31 DIAGNOSIS — IMO0002 Reserved for concepts with insufficient information to code with codable children: Secondary | ICD-10-CM | POA: Diagnosis not present

## 2013-10-31 DIAGNOSIS — Z803 Family history of malignant neoplasm of breast: Secondary | ICD-10-CM

## 2013-10-31 DIAGNOSIS — C50919 Malignant neoplasm of unspecified site of unspecified female breast: Secondary | ICD-10-CM | POA: Diagnosis not present

## 2013-10-31 DIAGNOSIS — IMO0001 Reserved for inherently not codable concepts without codable children: Secondary | ICD-10-CM | POA: Insufficient documentation

## 2013-10-31 DIAGNOSIS — Z853 Personal history of malignant neoplasm of breast: Secondary | ICD-10-CM

## 2013-10-31 NOTE — Progress Notes (Signed)
Patient Name: Mary [name] Patient Age: 80 y.o. Encounter Date: 10/31/2013  Referring Physician: Lurline Del, MD  Primary Care Provider: [name], MD   Mary [name], a 80 y.o. Ashkenazi Jewish female, is being seen at the [facility] due to a personal and family history of breast cancer.  She presents to clinic today to discuss the possibility of a hereditary predisposition to cancer and discuss whether genetic testing is warranted.  HISTORY OF PRESENT ILLNESS: Mary [name] was diagnosed with right breast cancer (IDC) in 2007 at the age of 14. She stated the tumor was ER positive. She had a right mastectomy.    Past Medical History  Diagnosis Date  . Heart palpitations   . Breast cancer 10/07    right; invasive ductal CA; Dr. Truddie Coco  . Impaired fasting glucose   . Mixed hyperlipidemia   . Constipation   . External hemorrhoid   . Adenomatous colon polyp 3/03  . Colonic mass 2007    high grade dysplasia (s/p L hemicolectomy)  . BCC (basal cell carcinoma), face 8/06    L nasolabial fold  . CTS (carpal tunnel syndrome)     right  . Osteoporosis     DEXA 01/2009; T-3.2 L fem neck, -2.4 L radius  . Ashkenazi Jewish ancestry     Past Surgical History  Procedure Laterality Date  . Mastectomy  12/07    RIGHT BREAST  . Colon surgery  6/07    high grade dysplasia; neg LN; lap-assisted surgery  . Appendectomy  age 42  . Tonsillectomy  age 58  . Cataract extraction, bilateral  R '93; L '91  . Carpal tunnel release  2/09    Right; Dr. Burney Gauze  . Trigger finger release  R 2/09; L 5/09    Dr. Burney Gauze  . Colonoscopy  05/20/2008    repeat due 05/2011; pt states she had in 2013 ([place])  . Partial knee arthroplasty Right 01/24/2013    Procedure: RIGHT KNEE MEDIAL UNICOMPARTMENTAL ARTHROPLASTY;  Surgeon: Gearlean Alf, MD;  Location: [facility];  Service: Orthopedics;  Laterality: Right;    History   Social History  . Marital Status: Widowed    Spouse Name: N/A   Number of Children: 3  . Years of Education: N/A   Social History Main Topics  . Smoking status: Never Smoker   . Smokeless tobacco: Never Used  . Alcohol Use: Yes     Comment: Very seldom  . Drug Use: No  . Sexual Activity: No   Other Topics Concern  . Not on file   Social History Narrative   Children live in [place]. She has 1 son and 2 daughters; brother and nephew live in [place]. Widowed '94     FAMILY HISTORY:   During the visit, a 4-generation pedigree was obtained. Significant diagnoses include the following:  Family History  Problem Relation Age of Onset  . Hypertension Mother   . Stroke Father   . Diabetes Father   . Hyperlipidemia Brother   . Breast cancer Paternal Aunt     dx 72s; deceased early 63s  . Breast cancer Cousin     paternal first cousin  . Colon cancer Cousin     paternal first cousin; age dx 33s  . Hyperlipidemia Son     Additionally, Mary [name] has two daughters and a son. She has a brother (age 75) who has two sons and a daughter.  Mary [name] ancestry is [place], [place] . She is of Ashkenazi Jewish descent.  ASSESSMENT AND PLAN: Mary [name] is a 80 y.o. female with a personal and family history of breast cancer in the context of being Ashkenazi Jewish. Given her age at diagnosis, this history is not suggestive of a hereditary predisposition to cancer, but BRCA1 and BRCA2 testing is indicated based on her ethnicity. We reviewed the characteristics, features and inheritance patterns of hereditary cancer syndromes. We also discussed genetic testing, including the process of testing, insurance coverage and implications of results. A negative result will be very reassuring for her and her family.  Mary [name] wished to pursue genetic testing and a blood sample will be sent to [facility] for analysis of the 3 common Ashkenazi Jewish BRCA mutations. If this test is negative, reflex testing will commence for full analysis of the BRCA1 and  BRCA2 genes. We discussed the implications of a positive, negative and/ or Variant of Uncertain Significance (VUS) result. Results should be available in approximately 2-3 weeks, at which point we will contact her and address implications for her as well as address genetic testing for at-risk family members, if needed.    We encouraged Mary [name] to remain in contact with Cancer Genetics annually so that we can update the family history and inform her of any changes in cancer genetics and testing that may be of benefit for this family. Ms.  [name] questions were answered to her satisfaction today.   Thank you for the referral and allowing [place] to share in the care of your patient.   The patient was seen for a total of 30 minutes, greater than 50% of which was spent face-to-face counseling. This patient was discussed with the overseeing provider who agrees with the above.   Mary Berg, MS, [place] Certified Genetic Counseor phone: [phone] [email or web address]

## 2013-11-10 DIAGNOSIS — H4420 Degenerative myopia, unspecified eye: Secondary | ICD-10-CM | POA: Diagnosis not present

## 2013-11-10 DIAGNOSIS — H40019 Open angle with borderline findings, low risk, unspecified eye: Secondary | ICD-10-CM | POA: Diagnosis not present

## 2013-11-10 DIAGNOSIS — H02409 Unspecified ptosis of unspecified eyelid: Secondary | ICD-10-CM | POA: Diagnosis not present

## 2013-11-10 DIAGNOSIS — H2640 Unspecified secondary cataract: Secondary | ICD-10-CM | POA: Diagnosis not present

## 2013-11-11 DIAGNOSIS — Z96659 Presence of unspecified artificial knee joint: Secondary | ICD-10-CM | POA: Diagnosis not present

## 2013-11-12 ENCOUNTER — Encounter: Payer: Self-pay | Admitting: Genetic Counselor

## 2013-11-12 NOTE — Progress Notes (Signed)
Referring Physician: Lurline Del, MD   Ms. Winnick was called today to discuss genetic test results. Please see the Genetics note from her visit on 10/31/13 for a detailed discussion of her personal and family history.  GENETIC TESTING: At the time of Ms. Hasten's visit, we recommended she pursue genetic testing of the BRCA1 and BRCA2 genes due to her personal history of breast cancer and Ashkenazi Jewish ancestry. This test, which included sequencing and deletion/duplication analysis, was performed at [facility]. Testing was normal and did not reveal a mutation in either of these genes.   We discussed with Ms. Sokolow that since the current test is not perfect, it is possible there may be a gene mutation that current testing cannot detect, but that chance is small. We also discussed that it is possible that a different genetic factor, which was not part of this testing or has not yet been discovered, is responsible for the cancer diagnoses in the family. Again, this chance is small given her family history. Should Ms. Broeker wish to discuss or pursue this additional testing, we are happy to coordinate this at any time, but do not feel that she is at significant risk of harboring a mutation in a different gene.     CANCER SCREENING:  This result suggests that Ms. Alyea's cancer was most likely not due to an inherited predisposition. Most cancers happen by chance and this negative test, along with details of her family history, suggests that her cancer falls into this category. We, therefore, recommended she continue to follow the cancer screening guidelines provided by her physician.   FAMILY MEMBERS:  Women in the family are at some increased risk of developing breast cancer, over the general population risk, simply due to the family history. We recommended they have a yearly mammogram beginning at age 96, a yearly clinical breast exam, and perform monthly breast self-exams. A gynecologic exam is  recommended yearly. Colon cancer screening is recommended to begin by age 52.  Lastly, we discussed with Ms. Cuadra that cancer genetics is a rapidly advancing field and it is possible that new genetic tests will be appropriate for her in the future. We encouraged her to remain in contact with [place] on an annual basis so we can update her personal and family histories, and let her know of advances in cancer genetics that may benefit the family. Our contact number was provided. Ms. Jacot questions were answered to her satisfaction today, and she knows she is welcome to call anytime with additional questions.    Steele Berg, MS, [place] Certified Genetic Counseor phone: [phone] [email or web address]

## 2013-11-14 DIAGNOSIS — M25569 Pain in unspecified knee: Secondary | ICD-10-CM | POA: Diagnosis not present

## 2013-12-03 ENCOUNTER — Other Ambulatory Visit: Payer: Medicare Other

## 2013-12-03 DIAGNOSIS — E782 Mixed hyperlipidemia: Secondary | ICD-10-CM | POA: Diagnosis not present

## 2013-12-04 LAB — LIPID PANEL
CHOL/HDL RATIO: 3.2 ratio
Cholesterol: 193 mg/dL (ref 0–200)
HDL: 61 mg/dL (ref >39)
LDL CALC: 87 mg/dL (ref 0–99)
TRIGLYCERIDES: 225 mg/dL — AB (ref <150)
VLDL: 45 mg/dL — AB (ref 0–40)

## 2013-12-08 DIAGNOSIS — Z23 Encounter for immunization: Secondary | ICD-10-CM | POA: Diagnosis not present

## 2014-01-14 DIAGNOSIS — Z853 Personal history of malignant neoplasm of breast: Secondary | ICD-10-CM | POA: Diagnosis not present

## 2014-01-14 DIAGNOSIS — Z1231 Encounter for screening mammogram for malignant neoplasm of breast: Secondary | ICD-10-CM | POA: Diagnosis not present

## 2014-01-14 LAB — HM MAMMOGRAPHY

## 2014-01-16 ENCOUNTER — Encounter: Payer: Self-pay | Admitting: Internal Medicine

## 2014-01-19 ENCOUNTER — Encounter: Payer: Self-pay | Admitting: Genetic Counselor

## 2014-01-27 ENCOUNTER — Other Ambulatory Visit: Payer: Self-pay | Admitting: Family Medicine

## 2014-02-02 DIAGNOSIS — Z85828 Personal history of other malignant neoplasm of skin: Secondary | ICD-10-CM | POA: Diagnosis not present

## 2014-02-02 DIAGNOSIS — L820 Inflamed seborrheic keratosis: Secondary | ICD-10-CM | POA: Diagnosis not present

## 2014-02-02 DIAGNOSIS — D1801 Hemangioma of skin and subcutaneous tissue: Secondary | ICD-10-CM | POA: Diagnosis not present

## 2014-02-02 DIAGNOSIS — L661 Lichen planopilaris: Secondary | ICD-10-CM | POA: Diagnosis not present

## 2014-02-02 DIAGNOSIS — D239 Other benign neoplasm of skin, unspecified: Secondary | ICD-10-CM | POA: Diagnosis not present

## 2014-02-03 DIAGNOSIS — Z471 Aftercare following joint replacement surgery: Secondary | ICD-10-CM | POA: Diagnosis not present

## 2014-02-03 DIAGNOSIS — Z96651 Presence of right artificial knee joint: Secondary | ICD-10-CM | POA: Diagnosis not present

## 2014-02-03 DIAGNOSIS — M25561 Pain in right knee: Secondary | ICD-10-CM | POA: Diagnosis not present

## 2014-02-26 ENCOUNTER — Telehealth: Payer: Self-pay | Admitting: Family Medicine

## 2014-02-26 NOTE — Telephone Encounter (Signed)
Entered ito immunizations.

## 2014-02-26 NOTE — Telephone Encounter (Signed)
Pt called to let us know that she had her flu shot on 12/08/13 at [facility] @ [place]

## 2014-03-26 DIAGNOSIS — M1711 Unilateral primary osteoarthritis, right knee: Secondary | ICD-10-CM | POA: Diagnosis not present

## 2014-03-26 DIAGNOSIS — Z96651 Presence of right artificial knee joint: Secondary | ICD-10-CM | POA: Diagnosis not present

## 2014-03-30 ENCOUNTER — Encounter: Payer: Self-pay | Admitting: Family Medicine

## 2014-03-30 ENCOUNTER — Ambulatory Visit (INDEPENDENT_AMBULATORY_CARE_PROVIDER_SITE_OTHER): Payer: Medicare Other | Admitting: Family Medicine

## 2014-03-30 VITALS — BP 118/72 | HR 72 | Temp 98.2°F | Ht 62.0 in | Wt 123.0 lb

## 2014-03-30 DIAGNOSIS — R05 Cough: Secondary | ICD-10-CM | POA: Diagnosis not present

## 2014-03-30 DIAGNOSIS — R059 Cough, unspecified: Secondary | ICD-10-CM

## 2014-03-30 DIAGNOSIS — J069 Acute upper respiratory infection, unspecified: Secondary | ICD-10-CM

## 2014-03-30 MED ORDER — HYDROCODONE-HOMATROPINE 5-1.5 MG/5ML PO SYRP: 2.5000 mL | ORAL_SOLUTION | Freq: Every evening | ORAL | Status: DC | PRN

## 2014-03-30 NOTE — Progress Notes (Signed)
Chief Complaint  Patient presents with  . Cough    was away in [place] for 3 weeks. Got back Monday, last week and started with a scratchy throat and nasal drip. Has coughing spells where she just can't stop. Doesn't think she had any fevers.    The day before returning from [place] she started with a scratchy throat (1/3).  Since being home, she has had ongoing dry cough.  She is having spasms of cough, sometimes productive of clear to slightly green mucus.  Mostly the cough is dry.  It is keeping her awake at night.  Not triggered by exercise, talking, laughing.    She denies any runny nose, but does have postnasal drainage.  No sinus pain, fevers, chills.  She had a little trouble with her breathing going upstairs when in [place], which doesn't often occur when she was in [place].  It was also intensely cold (teens). Currently denies DOE.  She has been using an OTC cough medication (Robitussin), which helps some.  She then spoke with the pharmacist and took another medication that had guaifenesin, and no decongestants.  It helped some also.  Mostly asking for medication to help her sleep.  PMH, PSH, SH reviewed.  Outpatient Encounter Prescriptions as of 03/30/2014  Medication Sig  . atorvastatin (LIPITOR) 40 MG tablet Take 1 tablet (40 mg total) by mouth every evening.  . bethanechol (URECHOLINE) 25 MG tablet Take 25 mg by mouth 2 (two) times daily.   Marland Kitchen CALAN SR 240 MG CR tablet TAKE 1 TABLET BY MOUTH EVERY NIGHT AT BEDTIME.  . Calcium Carbonate-Vitamin D (CALCIUM 600+D) 600-400 MG-UNIT per tablet Take 1 tablet by mouth 2 (two) times daily.   . Cholecalciferol (VITAMIN D3) 2000 UNITS TABS Take 2,000 Units by mouth daily.  Marland Kitchen denosumab (PROLIA) 60 MG/ML SOLN injection Inject 60 mg into the skin every 6 (six) months. Administer in upper arm, thigh, or abdomen  . LANOXIN 125 MCG tablet TAKE 1 TABLET BY MOUTH EVERY DAY.  Marland Kitchen loratadine (CLARITIN) 10 MG tablet Take 10 mg by mouth daily.  . Multiple  Vitamin (MULTIVITAMIN WITH MINERALS) TABS tablet Take 1 tablet by mouth daily.  . cetirizine (ZYRTEC) 10 MG tablet Take 10 mg by mouth as needed for allergies.  . polyethylene glycol (MIRALAX / GLYCOLAX) packet Take 17 g by mouth daily.   Allergies  Allergen Reactions  . Avelox [Moxifloxacin Hcl In Nacl] Nausea Only  . Biaxin [Clarithromycin]   . Clarithromycin Itching and Nausea Only  . Arimidex [Anastrozole] Rash  . Bactrim Nausea Only and Rash  . Penicillins Nausea Only and Rash  . Sulfa Drugs Cross Reactors Nausea And Vomiting and Rash   ROS:  No fevers, chills, nausea, vomiting, diarrhea, bleeding, bruising, edema.  No rashes. No urinary complaints No chest pain, palpitations. +cough as per HPI.  PHYSICAL EXAM: BP 118/72 mmHg  Pulse 72  Temp(Src) 98.2 F (36.8 C) (Tympanic)  Ht 5\' 2"  (1.575 m)  Wt 123 lb (55.792 kg)  BMI 22.49 kg/m2  Well developed, pleasant female in no distress.  No coughing during visit. HEENT: PERRL, EOMI, conjunctiva clear.  TM's and EAC's normal. Nasal mucosa is dry, with areas of recent bleed bilaterally.  Some white-yellow crusting noticed on the right.  Sinuses are nontender.  OP is clear without erythema or exudate. Neck: no lymphadenopathy or mass Heart: regular rate and rhythm without murmur Lungs: clear bilaterally. No wheezes, rales or ronchi Skin: no rash  ASSESSMENT/PLAN:  Acute upper  respiratory infection  Cough - Plan: HYDROcodone-homatropine (HYCODAN) 5-1.5 MG/5ML syrup   Discussed risks/side effects of medications. Try hycodan at bedtime only.  Call for tessalon prescription if cough medication is needed during the day or if not able to tolerate the cough syrup. Continue to drink plenty of fluids. Continue to take guaifenesin (expectorant found in many OTC medications--check the bottle you got after discussion with the pharmacist). Sleep with the head of the bed elevated. Your cough is likely related to postanasal drainage.   Medications that help dry up the drainage will also help with cough (ie Coricidin HPB, or antihistamines such as Claritin).  You might need antibiotics if discolored mucus worsens (gets thicker/darker), if you develop fever, or sinus pain.

## 2014-03-30 NOTE — Patient Instructions (Signed)
  Discussed risks/side effects of medications. Try hycodan at bedtime only.  Call for tessalon prescription if cough medication is needed during the day or if not able to tolerate the cough syrup. Continue to drink plenty of fluids. Continue to take guaifenesin (expectorant found in many OTC medications--check the bottle you got after discussion with the pharmacist). Sleep with the head of the bed elevated. You might need antibiotics if discolored mucus worsens (gets thicker/darker), if you develop fever, or sinus pain.  Your cough is likely related to postanasal drainage.  Medications that help dry up the drainage will also help with cough (ie Coricidin HPB, or antihistamines such as Claritin).

## 2014-04-15 ENCOUNTER — Encounter: Payer: Self-pay | Admitting: Family Medicine

## 2014-04-15 ENCOUNTER — Ambulatory Visit (INDEPENDENT_AMBULATORY_CARE_PROVIDER_SITE_OTHER): Payer: Medicare Other | Admitting: Family Medicine

## 2014-04-15 VITALS — BP 118/72 | HR 76 | Ht 62.0 in | Wt 124.0 lb

## 2014-04-15 DIAGNOSIS — Z0000 Encounter for general adult medical examination without abnormal findings: Secondary | ICD-10-CM | POA: Diagnosis not present

## 2014-04-15 DIAGNOSIS — R002 Palpitations: Secondary | ICD-10-CM

## 2014-04-15 DIAGNOSIS — Z5181 Encounter for therapeutic drug level monitoring: Secondary | ICD-10-CM | POA: Diagnosis not present

## 2014-04-15 DIAGNOSIS — K648 Other hemorrhoids: Secondary | ICD-10-CM | POA: Diagnosis not present

## 2014-04-15 DIAGNOSIS — Z01419 Encounter for gynecological examination (general) (routine) without abnormal findings: Secondary | ICD-10-CM

## 2014-04-15 DIAGNOSIS — E782 Mixed hyperlipidemia: Secondary | ICD-10-CM | POA: Diagnosis not present

## 2014-04-15 DIAGNOSIS — I10 Essential (primary) hypertension: Secondary | ICD-10-CM | POA: Diagnosis not present

## 2014-04-15 DIAGNOSIS — K644 Residual hemorrhoidal skin tags: Secondary | ICD-10-CM

## 2014-04-15 DIAGNOSIS — Z23 Encounter for immunization: Secondary | ICD-10-CM | POA: Diagnosis not present

## 2014-04-15 DIAGNOSIS — M810 Age-related osteoporosis without current pathological fracture: Secondary | ICD-10-CM

## 2014-04-15 LAB — COMPREHENSIVE METABOLIC PANEL
ALBUMIN: 4.3 g/dL (ref 3.5–5.2)
ALK PHOS: 76 U/L (ref 39–117)
ALT: 18 U/L (ref 0–35)
AST: 17 U/L (ref 0–37)
BUN: 14 mg/dL (ref 6–23)
CHLORIDE: 102 meq/L (ref 96–112)
CO2: 28 mEq/L (ref 19–32)
Calcium: 9.6 mg/dL (ref 8.4–10.5)
Creat: 0.74 mg/dL (ref 0.50–1.10)
Glucose, Bld: 85 mg/dL (ref 70–99)
Potassium: 4.1 mEq/L (ref 3.5–5.3)
SODIUM: 141 meq/L (ref 135–145)
Total Bilirubin: 0.5 mg/dL (ref 0.2–1.2)
Total Protein: 7.3 g/dL (ref 6.0–8.3)

## 2014-04-15 LAB — LIPID PANEL
CHOL/HDL RATIO: 3.3 ratio
Cholesterol: 207 mg/dL — ABNORMAL HIGH (ref 0–200)
HDL: 63 mg/dL (ref >39)
LDL Cholesterol: 97 mg/dL (ref 0–99)
TRIGLYCERIDES: 236 mg/dL — AB (ref <150)
VLDL: 47 mg/dL — AB (ref 0–40)

## 2014-04-15 LAB — TSH: TSH: 1.801 u[IU]/mL (ref 0.350–4.500)

## 2014-04-15 MED ORDER — LANOXIN 125 MCG PO TABS: 125.0000 ug | ORAL_TABLET | Freq: Every day | ORAL | Status: DC

## 2014-04-15 MED ORDER — ATORVASTATIN CALCIUM 40 MG PO TABS: 40.0000 mg | ORAL_TABLET | Freq: Every evening | ORAL | Status: DC

## 2014-04-15 MED ORDER — HYDROCORTISONE 2.5 % RE CREA: 1.0000 "application " | TOPICAL_CREAM | Freq: Two times a day (BID) | RECTAL | Status: DC

## 2014-04-15 MED ORDER — CALAN SR 240 MG PO TBCR: EXTENDED_RELEASE_TABLET | ORAL | Status: DC

## 2014-04-15 NOTE — Patient Instructions (Addendum)
  HEALTH MAINTENANCE RECOMMENDATIONS:  It is recommended that you get at least 30 minutes of aerobic exercise at least 5 days/week (for weight loss, you may need as much as 60-90 minutes). This can be any activity that gets your heart rate up. This can be divided in 10-15 minute intervals if needed, but try and build up your endurance at least once a week.  Weight bearing exercise is also recommended twice weekly.  Eat a healthy diet with lots of vegetables, fruits and fiber.  "Colorful" foods have a lot of vitamins (ie green vegetables, tomatoes, red peppers, etc).  Limit sweet tea, regular sodas and alcoholic beverages, all of which has a lot of calories and sugar.  Up to 1 alcoholic drink daily may be beneficial for women (unless trying to lose weight, watch sugars).  Drink a lot of water.  Calcium recommendations are 1200-1500 mg daily (1500 mg for postmenopausal women or women without ovaries), and vitamin D 1000 IU daily.  This should be obtained from diet and/or supplements (vitamins), and calcium should not be taken all at once, but in divided doses.  Monthly self breast exams and yearly mammograms for women over the age of 4 is recommended.  Sunscreen of at least SPF 30 should be used on all sun-exposed parts of the skin when outside between the hours of 10 am and 4 pm (not just when at beach or pool, but even with exercise, golf, tennis, and yard work!)  Use a sunscreen that says "broad spectrum" so it covers both UVA and UVB rays, and make sure to reapply every 1-2 hours.  Remember to change the batteries in your smoke detectors when changing your clock times in the spring and fall.  Use your seat belt every time you are in a car, and please drive safely and not be distracted with cell phones and texting while driving.   Consider getting shingles vaccine (you will need to get this from the pharmacy, not our office). Try taking the Miralax more frequently to prevent constipation,  allowing the hemorrhoids to improve. Use the Anusol-HC cream twice daily as needed for external hemorrhoids.  We will be in touch with your cholesterol results--to let you know if you need to change the amount of fish oil you are taking or not.

## 2014-04-15 NOTE — Progress Notes (Signed)
Chief Complaint  Patient presents with  . Med check plus    fasting with pap. No concerns today.    Mary [name] is a 81 y.o. female who presents for annual wellness visit and follow-up on chronic medical conditions.  She has the following concerns:  Seen 2 weeks ago with cough.  The cough syrup helped a lot, needed for just a few nights, and cough completely resolved.  Hypertension and palpitation follow-up: Blood pressures are not checked elsewhere. Denies dizziness, headaches, chest pain. Denies side effects of medications. Palpitations are well controlled. She needs refills of her medications.  Hyperlipidemia follow-up: Patient is reportedly following a low cholesterol diet. Compliant with medications and denies medication side effects. TG were elevated on last labs, done in September (225). She started taking one fish oil every morning after those labs--she has a hard time swallowing them--found one that was a little smaller, and she can swallow it only if she takes it with warm beverage--takes one with her coffee every morning. She has been trying to avoid sweets, and has cut back.  She has 2 [place] peppermint patties each day  Dr. Delman Cheadle has been treating her for alopecia with injections and topical steroids. She went last week. Not improving much, but not getting worse.  She was started on Prolia injections by Dr. [name] in July 2014--she is getting just once a year, and has had two injections so far for treatment of her osteoporosis. She is taking calcium and vitamin D. Not getting any weight-bearing exercise. DEXA due again in October.  While her knee pain had improved after surgery, she has had some recurrence of arthritis pain in the right knee.  She has seen Dr. Wynelle Link (November) and had x-rays.  Her walking has been limited due to pain.   Immunization History  Administered Date(s) Administered  . Influenza Split 12/01/2010, 01/09/2012, 12/18/2012  . Influenza-Unspecified  12/08/2013  . Pneumococcal Polysaccharide-23 12/19/2002, 09/04/2011  . Td 11/19/2003  . Tdap 09/04/2011  we have discussed shingles vaccine in the past, hasn't gotten Last Pap smear: 2013 Last mammogram: 12/2013 Last colonoscopy: 2013, told no further was needed Last DEXA: 12/2012 Dentist: twice yearly Ophtho: yearly Exercise: limited, due to ongoing problems with right knee pain.    Other doctors caring for patient include: Dr. Diamantina Monks Dr. Geronimo Running Dr. Olga Coaster (just prn, for cerumen removal) Dr. Lutricia Horsfall Dr. Mare Ferrari Dr. Magrinat--oncologist Dr. Belinda Block  Depression screen:  See scanned questionnaire.  Notable for some decrease in energy, appetite, sometimes has trouble making decisions.  "nothing major". ADL screen:  See scanned questionnaire.  Negative.  No falls in the last year  End of Life Discussion:  Patient has a living will and medical power of attorney  Past Medical History  Diagnosis Date  . Heart palpitations   . Breast cancer 10/07    right; invasive ductal CA; Dr. Truddie Coco  . Impaired fasting glucose   . Mixed hyperlipidemia   . Constipation   . External hemorrhoid   . Adenomatous colon polyp 3/03  . Colonic mass 2007    high grade dysplasia (s/p L hemicolectomy)  . BCC (basal cell carcinoma), face 8/06    L nasolabial fold  . CTS (carpal tunnel syndrome)     right  . Osteoporosis     DEXA 01/2009; T-3.2 L fem neck, -2.4 L radius  . Ashkenazi Jewish ancestry     Past Surgical History  Procedure Laterality Date  . Mastectomy  12/07    RIGHT BREAST  .  Colon surgery  6/07    high grade dysplasia; neg LN; lap-assisted surgery  . Appendectomy  age 22  . Tonsillectomy  age 43  . Cataract extraction, bilateral  R '93; L '91  . Carpal tunnel release  2/09    Right; Dr. Burney Gauze  . Trigger finger release  R 2/09; L 5/09    Dr. Burney Gauze  . Colonoscopy  05/20/2008    repeat due 05/2011; pt states she had in 2013 ([facility])   . Partial knee arthroplasty Right 01/24/2013    Procedure: RIGHT KNEE MEDIAL UNICOMPARTMENTAL ARTHROPLASTY;  Surgeon: Gearlean Alf, MD;  Location: [facility];  Service: Orthopedics;  Laterality: Right;    History   Social History  . Marital Status: Widowed    Spouse Name: N/A    Number of Children: 3  . Years of Education: N/A   Occupational History  . Not on file.   Social History Main Topics  . Smoking status: Never Smoker   . Smokeless tobacco: Never Used  . Alcohol Use: Yes     Comment: Very seldom  . Drug Use: No  . Sexual Activity: No   Other Topics Concern  . Not on file   Social History Narrative   Children live in [place]. She has 1 son and 2 daughters; brother and nephew live in [place]. Widowed '94    Family History  Problem Relation Age of Onset  . Hypertension Mother   . Stroke Father   . Diabetes Father   . Hyperlipidemia Brother   . Breast cancer Paternal Aunt     dx 29s; deceased early 28s  . Breast cancer Cousin     paternal first cousin  . Colon cancer Cousin     paternal first cousin; age dx 58s  . Hyperlipidemia Son     Outpatient Encounter Prescriptions as of 04/15/2014  Medication Sig Note  . atorvastatin (LIPITOR) 40 MG tablet Take 1 tablet (40 mg total) by mouth every evening.   . bethanechol (URECHOLINE) 25 MG tablet Take 25 mg by mouth 2 (two) times daily.  01/13/2013: -  . CALAN SR 240 MG CR tablet TAKE 1 TABLET BY MOUTH EVERY NIGHT AT BEDTIME.   . Calcium Carbonate-Vitamin D (CALCIUM 600+D) 600-400 MG-UNIT per tablet Take 1 tablet by mouth 2 (two) times daily.    . cetirizine (ZYRTEC) 10 MG tablet Take 10 mg by mouth as needed for allergies.   . Cholecalciferol (VITAMIN D3) 2000 UNITS TABS Take 2,000 Units by mouth daily.   Marland Kitchen denosumab (PROLIA) 60 MG/ML SOLN injection Inject 60 mg into the skin every 6 (six) months. Administer in upper arm, thigh, or abdomen 04/03/2013: Started by Dr. [name], first dose 10/2012  . LANOXIN 125 MCG  tablet Take 1 tablet (125 mcg total) by mouth daily.   Marland Kitchen loratadine (CLARITIN) 10 MG tablet Take 10 mg by mouth daily.   . Multiple Vitamin (MULTIVITAMIN WITH MINERALS) TABS tablet Take 1 tablet by mouth daily.   . Omega-3 Fatty Acids (FISH OIL) 1200 MG CAPS Take 1 capsule by mouth every morning.   . polyethylene glycol (MIRALAX / GLYCOLAX) packet Take 17 g by mouth daily. 04/03/2013: Takes prn  . [DISCONTINUED] atorvastatin (LIPITOR) 40 MG tablet Take 1 tablet (40 mg total) by mouth every evening.   . [DISCONTINUED] CALAN SR 240 MG CR tablet TAKE 1 TABLET BY MOUTH EVERY NIGHT AT BEDTIME.   . [DISCONTINUED] LANOXIN 125 MCG tablet TAKE 1 TABLET BY MOUTH  EVERY DAY.   . hydrocortisone (ANUSOL-HC) 2.5 % rectal cream Place 1 application rectally 2 (two) times daily.   . [DISCONTINUED] HYDROcodone-homatropine (HYCODAN) 5-1.5 MG/5ML syrup Take 2.5-5 mLs by mouth at bedtime as needed for cough.     Allergies  Allergen Reactions  . Avelox [Moxifloxacin Hcl In Nacl] Nausea Only  . Biaxin [Clarithromycin]   . Clarithromycin Itching and Nausea Only  . Arimidex [Anastrozole] Rash  . Bactrim Nausea Only and Rash  . Penicillins Nausea Only and Rash  . Sulfa Drugs Cross Reactors Nausea And Vomiting and Rash    ROS: The patient denies anorexia, fever, headaches, vision changes, decreased hearing, ear pain, sore throat, breast concerns, chest pain, palpitations, dizziness, syncope, dyspnea on exertion, cough, swelling, nausea, vomiting, diarrhea, abdominal pain, melena, hematochezia, indigestion/heartburn, hematuria, dysuria, vaginal bleeding, discharge, odor or itch, genital lesions, joint pains (other than knee), numbness, tingling, weakness, tremor, suspicious skin lesions, depression, anxiety, abnormal bleeding/bruising, or enlarged lymph nodes. Very intermittent dysphagia--feels like a lump if she drinks very cold water; sometimes with beef.  She has a hard time swallowing large pills (can only take  Caltrate, no other calcium, and trouble with certain fish oil capsules). Constipation occasionallly, overall doing well--better when she remembers to use Miralax.  Has periodic flares of hemorrhoids, flaring now. Sleeps well, up 2-3x to void. Alopecia--being actively treated by dermatologist (injections and spray)--not noticing much improvement.   PHYSICAL EXAM:  BP 118/72 mmHg  Pulse 76  Ht 5' 2"  (1.575 m)  Wt 124 lb (56.246 kg)  BMI 22.67 kg/m2  General Appearance:   Alert, cooperative, no distress, appears somewhat younger than stated age  Head:   Normocephalic, without obvious abnormality, atraumatic  Eyes:   PERRL, conjunctiva/corneas clear, EOM's intact, fundi   benign  Ears:   Normal external ear canals; TM's occluded by cerumen on the left, normal on the right  Nose:  Nares normal, mucosa normal, no drainage or sinus tenderness  Throat:  Lips, mucosa, and tongue normal; teeth and gums normal. +torus pallatini  Neck:  Supple, no lymphadenopathy; thyroid: no enlargement/tenderness/nodules; no carotid  bruit or JVD  Back:  Spine nontender, no curvature, ROM normal, no CVA tenderness  Lungs:   Clear to auscultation bilaterally without wheezes, rales or ronchi; respirations unlabored  Chest Wall:   No tenderness or deformity  Heart:   Regular rate and rhythm, S1 and S2 normal, no murmur, rub  or gallop  Breast Exam:   No tenderness, masses, or nipple discharge or inversion of left breast. Right breast is absent, WHSS, no mass. No axillary lymphadenopathy  Abdomen:   Soft, non-tender, nondistended, normoactive bowel sounds,   no masses, no hepatosplenomegaly. +WHSS. +small umbilical hernia, easily reducible, nontender  Genitalia:   Normal external genitalia without lesions. BUS and vagina normal; cervix without lesions, or cervical motion tenderness. No abnormal vaginal discharge. Uterus and adnexa not enlarged,  nontender, no masses. Pap not performed  Rectal:   Normal tone, no masses or tenderness; large, nontender external hemorrhoids. Only scant amount of stool noted, guaiac positive stool(likely related to inflamed hemorrhoids).   Extremities:  No clubbing, cyanosis or edema. WHSS at right knee  Pulses:  2+ and symmetric all extremities  Skin:  Skin color, texture, turgor normal, no rashes or lesions  Lymph nodes:  Cervical, supraclavicular, and axillary nodes normal  Neurologic:  CNII-XII intact, normal strength, sensation and gait; reflexes 2+ and symmetric throughout   Psych: Normal mood, affect, hygiene and grooming.  ASSESSMENT/PLAN:  Medicare annual wellness visit, subsequent  Mixed hyperlipidemia - due for recheck; diet has improved some, and taking 1275m of fish oil daily - Plan: atorvastatin (LIPITOR) 40 MG tablet, Lipid panel, Comprehensive metabolic panel  Palpitations - controlled with calan and digoxin - Plan: CALAN SR 240 MG CR tablet, LANOXIN 125 MCG tablet, TSH, Digoxin level  Essential hypertension - controlled - Plan: CALAN SR 240 MG CR tablet, Comprehensive metabolic panel  Medication monitoring encounter - Plan: Lipid panel, Comprehensive metabolic panel, Digoxin level  Immunization due - Plan: Pneumococcal conjugate vaccine 13-valent  External hemorrhoids - inflamed.  reviewed treatment for constipation (high fiber diet, water, miralax more frequently); Anusol HC BID prn - Plan: hydrocortisone (ANUSOL-HC) 2.5 % rectal cream  Osteoporosis - on Prolia.  Discussed calcium, Vit D, weightbearing exercise.  DEXA due again in October 2016    c-met, lipid, TSH, dig level.  Discussed monthly self breast exams and yearly mammograms; at least 30 minutes of aerobic activity at least 5 days/week, weight-bearing exercise 2x/week; proper sunscreen use reviewed; healthy diet, including goals of calcium and vitamin D  intake and alcohol recommendations (less than or equal to 1 drink/day) reviewed; regular seatbelt use; changing batteries in smoke detectors. Immunization recommendations discussed, Prevnar-13 given. Zostavax recommended (previously reviewed risks/benefits and prescription given, but pt declined to get). Colonoscopy recommendations reviewed, UTD. Patient has Living Will and healthcare POA; full care.   Medicare Attestation I have personally reviewed: The patient's medical and social history Their use of alcohol, tobacco or illicit drugs Their current medications and supplements The patient's functional ability including ADLs,fall risks, home safety risks, cognitive, and hearing and visual impairment Diet and physical activities Evidence for depression or mood disorders  The patient's weight, height, BMI, and visual acuity have been recorded in the chart.  I have made referrals, counseling, and provided education to the patient based on review of the abov.     Irisha Grandmaison A, MD   04/15/2014

## 2014-04-16 LAB — DIGOXIN LEVEL: DIGOXIN LVL: 0.8 ng/mL (ref 0.8–2.0)

## 2014-05-05 DIAGNOSIS — L570 Actinic keratosis: Secondary | ICD-10-CM | POA: Diagnosis not present

## 2014-05-05 DIAGNOSIS — L661 Lichen planopilaris: Secondary | ICD-10-CM | POA: Diagnosis not present

## 2014-05-06 DIAGNOSIS — M1712 Unilateral primary osteoarthritis, left knee: Secondary | ICD-10-CM | POA: Diagnosis not present

## 2014-05-10 ENCOUNTER — Telehealth: Payer: Self-pay | Admitting: Family Medicine

## 2014-05-11 NOTE — Telephone Encounter (Signed)
P.A. CALAN approved til 03/20/15, left message for pt, faxed pharmacy

## 2014-05-14 ENCOUNTER — Other Ambulatory Visit: Payer: Self-pay | Admitting: Nurse Practitioner

## 2014-05-29 DIAGNOSIS — N312 Flaccid neuropathic bladder, not elsewhere classified: Secondary | ICD-10-CM | POA: Diagnosis not present

## 2014-07-09 ENCOUNTER — Encounter: Payer: Self-pay | Admitting: Family Medicine

## 2014-07-09 ENCOUNTER — Ambulatory Visit (INDEPENDENT_AMBULATORY_CARE_PROVIDER_SITE_OTHER): Payer: Medicare Other | Admitting: Family Medicine

## 2014-07-09 VITALS — BP 154/86 | HR 80 | Temp 99.6°F

## 2014-07-09 DIAGNOSIS — R059 Cough, unspecified: Secondary | ICD-10-CM

## 2014-07-09 DIAGNOSIS — J302 Other seasonal allergic rhinitis: Secondary | ICD-10-CM | POA: Diagnosis not present

## 2014-07-09 DIAGNOSIS — R05 Cough: Secondary | ICD-10-CM | POA: Diagnosis not present

## 2014-07-09 DIAGNOSIS — J0110 Acute frontal sinusitis, unspecified: Secondary | ICD-10-CM

## 2014-07-09 MED ORDER — AZITHROMYCIN 250 MG PO TABS: ORAL_TABLET | ORAL | Status: DC

## 2014-07-09 MED ORDER — HYDROCODONE-HOMATROPINE 5-1.5 MG/5ML PO SYRP: 2.5000 mL | ORAL_SOLUTION | Freq: Four times a day (QID) | ORAL | Status: DC | PRN

## 2014-07-09 NOTE — Progress Notes (Signed)
Chief Complaint  Patient presents with  . Cough    dry cough x couple weeks-over the past few days it has worsened. Bringing up a lot of discolored mucus. Has been taking small amount of Hycodan from prior visit and needs refill.   She started with an "allergy cough" at least 2-3 weeks ago.  2 days ago it got worse, started producting phlegm, which is slightly discolored.  She is not aware of any fever, no chills.  She has been having a frontal headache since yesterday morning.  Denies shortness of breath.  She hasn't been taking her claritin.  Denies runny nose, sneezing or eye symptoms, but feels like she has been having postnasal drainage.  Only sneezing when having coughing jags. No sick contacts.  PMH, PSH, SH reviewed  Outpatient Encounter Prescriptions as of 07/09/2014  Medication Sig Note  . atorvastatin (LIPITOR) 40 MG tablet Take 1 tablet (40 mg total) by mouth every evening.   . bethanechol (URECHOLINE) 25 MG tablet Take 25 mg by mouth 2 (two) times daily.  01/13/2013: -  . CALAN SR 240 MG CR tablet TAKE 1 TABLET BY MOUTH EVERY NIGHT AT BEDTIME. (Patient taking differently: Take 240 mg by mouth daily. TAKE 1 TABLET BY MOUTH EVERY NIGHT AT BEDTIME.)   . Calcium Carbonate-Vitamin D (CALCIUM 600+D) 600-400 MG-UNIT per tablet Take 1 tablet by mouth 2 (two) times daily.    . Cholecalciferol (VITAMIN D3) 2000 UNITS TABS Take 2,000 Units by mouth daily.   Marland Kitchen denosumab (PROLIA) 60 MG/ML SOLN injection Inject 60 mg into the skin See admin instructions. Administer in upper arm, thigh, or abdomen 07/09/2014: yearly  . HYDROcodone-homatropine (HYCODAN) 5-1.5 MG/5ML syrup Take 2.5-5 mLs by mouth every 6 (six) hours as needed for cough.   Marland Kitchen LANOXIN 125 MCG tablet Take 1 tablet (125 mcg total) by mouth daily.   Marland Kitchen loratadine (CLARITIN) 10 MG tablet Take 10 mg by mouth as needed.    . Multiple Vitamin (MULTIVITAMIN WITH MINERALS) TABS tablet Take 1 tablet by mouth daily.   . Omega-3 Fatty Acids (FISH  OIL) 1200 MG CAPS Take 1 capsule by mouth every morning.   . cetirizine (ZYRTEC) 10 MG tablet Take 10 mg by mouth as needed for allergies.   . hydrocortisone (ANUSOL-HC) 2.5 % rectal cream Place 1 application rectally 2 (two) times daily. (Patient not taking: Reported on 07/09/2014)   . polyethylene glycol (MIRALAX / GLYCOLAX) packet Take 17 g by mouth daily. 04/03/2013: Takes prn  . [DISCONTINUED] clobetasol (TEMOVATE) 0.05 % external solution Apply 1 application topically 2 (two) times daily.  07/09/2014: Received from: External Pharmacy   Allergies  Allergen Reactions  . Avelox [Moxifloxacin Hcl In Nacl] Nausea Only  . Biaxin [Clarithromycin]   . Clarithromycin Itching and Nausea Only  . Arimidex [Anastrozole] Rash  . Bactrim Nausea Only and Rash  . Penicillins Nausea Only and Rash  . Sulfa Drugs Cross Reactors Nausea And Vomiting and Rash  pt states she tolerated z-paks without problems in the past.    ROS: Doesn't check BP elsewhere.  Denies headache or dizziness (other than sinus headache x 2 days). No dizziness, chest pain, palpitations. No nausea, vomiting, diarrhea, no rashes. No urinary complaints or other concerns except as per HPI  PHYSICAL EXAM: BP 154/86 mmHg  Pulse 80  Temp(Src) 99.6 F (37.6 C) (Tympanic)  Well developed, pleasant female, slightly hoarse voice with occasional cough HEENT: PERRL, EOMI, conjunctiva clear. TM's and EAC's normal.  Nasal mucosa is  mildly edematous, erythematous on the left. No purulence.  OP is clear. Sinuses nontender Neck: no lymphadenopathy, thyromegaly or mass Heart: regular rate and rhythm without murmur Lungs: clear bilaterally  ASSESSMENT/PLAN:  Acute frontal sinusitis, recurrence not specified - Plan: azithromycin (ZITHROMAX) 250 MG tablet  Seasonal allergies - take claritin daily  Cough - Plan: HYDROcodone-homatropine (HYCODAN) 5-1.5 MG/5ML syrup  Allergies reviewed in detail.  Problems with biaxin likely intolerance, and she  states she has taken zpak without problems in the past.  Multiple allergies noted.  Potential side effects and allergic reactions reviewed. Risks/side effects of hycodan reviewed--to use sparingly, at bedtime, no driving while taking.   Drink plenty of fluids. Use Mucinex DM (or robitussin DM) to help thin out the mucus and suppress the cough. Use the hycodan syrup at bedtime only. If you don't tolerate the Mucinex DM, call for a prescription for Tessalon, which can help with the daytime cough. Take the antibiotics as directed--remember that the antibiotic stays in your system for 10 days, even though you only take it for 5.  Call if you aren't better after 10 days, call sooner (ie 5 days) if you are worse--ongoing fevers, ongoing discolored mucus. Since it sounds like you have underlying allergies, which may have contributed to getting a sinus infection, it is a good idea to take claritin daily.   F/u if symptoms persist/worsen

## 2014-07-09 NOTE — Patient Instructions (Signed)
  Drink plenty of fluids. Use Mucinex DM (or robitussin DM) to help thin out the mucus and suppress the cough. Use the hycodan syrup at bedtime only. If you don't tolerate the Mucinex DM, call for a prescription for Tessalon, which can help with the daytime cough. Take the antibiotics as directed--remember that the antibiotic stays in your system for 10 days, even though you only take it for 5.  Call if you aren't better after 10 days, call sooner (ie 5 days) if you are worse--ongoing fevers, ongoing discolored mucus. Since it sounds like you have underlying allergies, which may have contributed to getting a sinus infection, it is a good idea to take claritin daily.

## 2014-07-21 DIAGNOSIS — L661 Lichen planopilaris: Secondary | ICD-10-CM | POA: Diagnosis not present

## 2014-07-21 DIAGNOSIS — L723 Sebaceous cyst: Secondary | ICD-10-CM | POA: Diagnosis not present

## 2014-09-22 ENCOUNTER — Telehealth: Payer: Self-pay | Admitting: Internal Medicine

## 2014-09-22 DIAGNOSIS — R002 Palpitations: Secondary | ICD-10-CM

## 2014-09-22 DIAGNOSIS — I10 Essential (primary) hypertension: Secondary | ICD-10-CM

## 2014-09-22 NOTE — Telephone Encounter (Signed)
Pt called and states that she got a called stating that Calan SR 240mg  is no longer being made anymore and pt will need to get on verapamil. But she would like to talk to you about this med. Send to [facility]. (she is aware you are not here today and it will be tomorrow before you get to it tomorrow)

## 2014-09-22 NOTE — Telephone Encounter (Signed)
She is being told that she needs to switch to the generic.  I personally am not aware of the brand no longer being available--not sure who she heard this from (it might be her insurance telling her they would prefer her to take the generic)--she can verify with her pharmacist if this is the case.  Verapamil ER is the generic substitute, which is what I would recommend if brand is truly no longer available.

## 2014-09-23 MED ORDER — VERAPAMIL HCL ER 240 MG PO TBCR: 240.0000 mg | EXTENDED_RELEASE_TABLET | Freq: Every day | ORAL | Status: DC

## 2014-09-23 NOTE — Telephone Encounter (Signed)
Called pt and pt states she was at [facility] pharmacy yesterday and pharmacist said that calan was no longer going to be available.

## 2014-09-23 NOTE — Telephone Encounter (Signed)
Spoke with patient. She only has 4 pills left.  Last week they said they would be getting a shipment in, but when she checked again this week, someone else told her they wouldn't be getting anymore.  She is willing to try the generic for 30 days, to give her time to look into whether or not she will truly be unable to get Calan SR (she will check with other pharmacies).  If doing well on the verapamil/generic, then she will call and we can change to a 90 day prescription.

## 2014-09-25 ENCOUNTER — Other Ambulatory Visit: Payer: Self-pay | Admitting: *Deleted

## 2014-09-25 DIAGNOSIS — Z853 Personal history of malignant neoplasm of breast: Secondary | ICD-10-CM

## 2014-09-28 ENCOUNTER — Other Ambulatory Visit (HOSPITAL_BASED_OUTPATIENT_CLINIC_OR_DEPARTMENT_OTHER): Payer: Medicare Other

## 2014-09-28 ENCOUNTER — Telehealth: Payer: Self-pay | Admitting: Oncology

## 2014-09-28 DIAGNOSIS — Z853 Personal history of malignant neoplasm of breast: Secondary | ICD-10-CM

## 2014-09-28 LAB — CBC WITH DIFFERENTIAL/PLATELET
BASO%: 1.6 % (ref 0.0–2.0)
BASOS ABS: 0.1 10*3/uL (ref 0.0–0.1)
EOS%: 4 % (ref 0.0–7.0)
Eosinophils Absolute: 0.2 10*3/uL (ref 0.0–0.5)
HEMATOCRIT: 40.1 % (ref 34.8–46.6)
HGB: 13.2 g/dL (ref 11.6–15.9)
LYMPH#: 1.4 10*3/uL (ref 0.9–3.3)
LYMPH%: 22.6 % (ref 14.0–49.7)
MCH: 28.4 pg (ref 25.1–34.0)
MCHC: 32.8 g/dL (ref 31.5–36.0)
MCV: 86.6 fL (ref 79.5–101.0)
MONO#: 0.5 10*3/uL (ref 0.1–0.9)
MONO%: 8.8 % (ref 0.0–14.0)
NEUT#: 3.8 10*3/uL (ref 1.5–6.5)
NEUT%: 63 % (ref 38.4–76.8)
Platelets: 242 10*3/uL (ref 145–400)
RBC: 4.63 10*6/uL (ref 3.70–5.45)
RDW: 14.8 % — AB (ref 11.2–14.5)
WBC: 6.1 10*3/uL (ref 3.9–10.3)

## 2014-09-28 LAB — COMPREHENSIVE METABOLIC PANEL (CC13)
ALT: 20 U/L (ref 0–55)
AST: 16 U/L (ref 5–34)
Albumin: 3.7 g/dL (ref 3.5–5.0)
Alkaline Phosphatase: 72 U/L (ref 40–150)
Anion Gap: 8 mEq/L (ref 3–11)
BILIRUBIN TOTAL: 0.55 mg/dL (ref 0.20–1.20)
BUN: 15.4 mg/dL (ref 7.0–26.0)
CALCIUM: 9.9 mg/dL (ref 8.4–10.4)
CO2: 29 mEq/L (ref 22–29)
Chloride: 105 mEq/L (ref 98–109)
Creatinine: 0.9 mg/dL (ref 0.6–1.1)
EGFR: 62 mL/min/{1.73_m2} — ABNORMAL LOW (ref >90)
GLUCOSE: 92 mg/dL (ref 70–140)
POTASSIUM: 3.8 meq/L (ref 3.5–5.1)
SODIUM: 142 meq/L (ref 136–145)
TOTAL PROTEIN: 7.1 g/dL (ref 6.4–8.3)

## 2014-09-28 NOTE — Telephone Encounter (Signed)
Pt confirmed labs/ov per 07/11 POF, gave pt AVS and Calendar.... KJ °

## 2014-10-02 ENCOUNTER — Other Ambulatory Visit: Payer: Self-pay | Admitting: *Deleted

## 2014-10-02 DIAGNOSIS — Z853 Personal history of malignant neoplasm of breast: Secondary | ICD-10-CM

## 2014-10-05 ENCOUNTER — Ambulatory Visit: Payer: Medicare Other | Admitting: Oncology

## 2014-10-05 ENCOUNTER — Other Ambulatory Visit: Payer: Self-pay | Admitting: *Deleted

## 2014-10-05 ENCOUNTER — Ambulatory Visit (HOSPITAL_BASED_OUTPATIENT_CLINIC_OR_DEPARTMENT_OTHER): Payer: Medicare Other

## 2014-10-05 ENCOUNTER — Telehealth: Payer: Self-pay | Admitting: *Deleted

## 2014-10-05 ENCOUNTER — Other Ambulatory Visit (HOSPITAL_BASED_OUTPATIENT_CLINIC_OR_DEPARTMENT_OTHER): Payer: Medicare Other

## 2014-10-05 ENCOUNTER — Other Ambulatory Visit: Payer: Self-pay | Admitting: Oncology

## 2014-10-05 VITALS — BP 176/94 | HR 77 | Temp 98.6°F

## 2014-10-05 DIAGNOSIS — M810 Age-related osteoporosis without current pathological fracture: Secondary | ICD-10-CM | POA: Diagnosis present

## 2014-10-05 DIAGNOSIS — Z853 Personal history of malignant neoplasm of breast: Secondary | ICD-10-CM

## 2014-10-05 DIAGNOSIS — R002 Palpitations: Secondary | ICD-10-CM

## 2014-10-05 DIAGNOSIS — I10 Essential (primary) hypertension: Secondary | ICD-10-CM

## 2014-10-05 LAB — CBC WITH DIFFERENTIAL/PLATELET
BASO%: 1.1 % (ref 0.0–2.0)
Basophils Absolute: 0.1 10*3/uL (ref 0.0–0.1)
EOS ABS: 0.2 10*3/uL (ref 0.0–0.5)
EOS%: 3 % (ref 0.0–7.0)
HCT: 41.4 % (ref 34.8–46.6)
HEMOGLOBIN: 13.8 g/dL (ref 11.6–15.9)
LYMPH%: 25.6 % (ref 14.0–49.7)
MCH: 28.8 pg (ref 25.1–34.0)
MCHC: 33.3 g/dL (ref 31.5–36.0)
MCV: 86.4 fL (ref 79.5–101.0)
MONO#: 0.6 10*3/uL (ref 0.1–0.9)
MONO%: 9.5 % (ref 0.0–14.0)
NEUT#: 4 10*3/uL (ref 1.5–6.5)
NEUT%: 60.8 % (ref 38.4–76.8)
PLATELETS: 245 10*3/uL (ref 145–400)
RBC: 4.8 10*6/uL (ref 3.70–5.45)
RDW: 14.6 % — AB (ref 11.2–14.5)
WBC: 6.6 10*3/uL (ref 3.9–10.3)
lymph#: 1.7 10*3/uL (ref 0.9–3.3)

## 2014-10-05 LAB — COMPREHENSIVE METABOLIC PANEL (CC13)
ALT: 20 U/L (ref 0–55)
ANION GAP: 7 meq/L (ref 3–11)
AST: 16 U/L (ref 5–34)
Albumin: 3.9 g/dL (ref 3.5–5.0)
Alkaline Phosphatase: 78 U/L (ref 40–150)
BILIRUBIN TOTAL: 0.44 mg/dL (ref 0.20–1.20)
BUN: 16.3 mg/dL (ref 7.0–26.0)
CALCIUM: 10 mg/dL (ref 8.4–10.4)
CHLORIDE: 105 meq/L (ref 98–109)
CO2: 30 meq/L — AB (ref 22–29)
Creatinine: 0.8 mg/dL (ref 0.6–1.1)
EGFR: 69 mL/min/{1.73_m2} — AB (ref >90)
GLUCOSE: 88 mg/dL (ref 70–140)
Potassium: 3.9 mEq/L (ref 3.5–5.1)
Sodium: 142 mEq/L (ref 136–145)
Total Protein: 7.4 g/dL (ref 6.4–8.3)

## 2014-10-05 MED ORDER — DENOSUMAB 60 MG/ML ~~LOC~~ SOLN
60.0000 mg | Freq: Once | SUBCUTANEOUS | Status: AC
  Administered 2014-10-05: 60 mg via SUBCUTANEOUS
  Filled 2014-10-05: qty 1

## 2014-10-05 MED ORDER — VERAPAMIL HCL ER 240 MG PO TBCR: 240.0000 mg | EXTENDED_RELEASE_TABLET | Freq: Every day | ORAL | Status: DC

## 2014-10-05 NOTE — Telephone Encounter (Signed)
Patient called and stated that the verapamil that you gave her in place of the brand name Calan seems to be doing fine, no side effects. Asking for refill as rx was only for 30 days.

## 2014-10-05 NOTE — Telephone Encounter (Signed)
90 day supply (with refill) was sent

## 2014-10-06 ENCOUNTER — Telehealth: Payer: Self-pay | Admitting: Oncology

## 2014-10-06 NOTE — Telephone Encounter (Signed)
Left message to confirm appointment for August. Mailed calendar °

## 2014-10-15 ENCOUNTER — Telehealth: Payer: Self-pay | Admitting: *Deleted

## 2014-10-15 NOTE — Telephone Encounter (Signed)
Patient call and is asking if you will please call her Mary [name]. Sounds like pharmacy told her they no make lanoxin and she will need to change to digoxin-very worried and requesting to speak to you before you go on vacation. Thanks.

## 2014-10-15 NOTE — Telephone Encounter (Signed)
I spoke with pharmacist at [facility].  They state Lanoxin is not available.  I gave her authorization to change to fill the generic for the remainder of her rx (9 with 1 add'l refill), and that if it becomes available in the future, that she likely would want to change back.

## 2014-10-19 ENCOUNTER — Ambulatory Visit (HOSPITAL_BASED_OUTPATIENT_CLINIC_OR_DEPARTMENT_OTHER): Payer: Medicare Other | Admitting: Nurse Practitioner

## 2014-10-19 ENCOUNTER — Telehealth: Payer: Self-pay | Admitting: Oncology

## 2014-10-19 ENCOUNTER — Encounter: Payer: Self-pay | Admitting: Nurse Practitioner

## 2014-10-19 VITALS — BP 156/79 | HR 81 | Temp 98.4°F | Resp 18 | Ht 62.0 in | Wt 128.4 lb

## 2014-10-19 DIAGNOSIS — M810 Age-related osteoporosis without current pathological fracture: Secondary | ICD-10-CM | POA: Diagnosis not present

## 2014-10-19 DIAGNOSIS — Z853 Personal history of malignant neoplasm of breast: Secondary | ICD-10-CM

## 2014-10-19 NOTE — Telephone Encounter (Signed)
Gave and printed appt sched and avs for pt for OCT and July 2017...the patient sched for mammo/bone density on 10.31 @ 11am
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
## 2014-10-22 DIAGNOSIS — M25561 Pain in right knee: Secondary | ICD-10-CM | POA: Diagnosis not present

## 2014-10-22 DIAGNOSIS — Z96651 Presence of right artificial knee joint: Secondary | ICD-10-CM | POA: Diagnosis not present

## 2014-10-22 DIAGNOSIS — Z471 Aftercare following joint replacement surgery: Secondary | ICD-10-CM | POA: Diagnosis not present

## 2014-10-30 DIAGNOSIS — L661 Lichen planopilaris: Secondary | ICD-10-CM | POA: Diagnosis not present

## 2014-11-16 DIAGNOSIS — H3531 Nonexudative age-related macular degeneration: Secondary | ICD-10-CM | POA: Diagnosis not present

## 2014-11-16 DIAGNOSIS — H02403 Unspecified ptosis of bilateral eyelids: Secondary | ICD-10-CM | POA: Diagnosis not present

## 2014-11-16 DIAGNOSIS — H40013 Open angle with borderline findings, low risk, bilateral: Secondary | ICD-10-CM | POA: Diagnosis not present

## 2014-11-16 DIAGNOSIS — H5213 Myopia, bilateral: Secondary | ICD-10-CM | POA: Diagnosis not present

## 2014-12-08 NOTE — Progress Notes (Signed)
Faxed order to [facility] for bone density test.

## 2014-12-21 ENCOUNTER — Ambulatory Visit (INDEPENDENT_AMBULATORY_CARE_PROVIDER_SITE_OTHER): Payer: Medicare Other | Admitting: Family Medicine

## 2014-12-21 ENCOUNTER — Encounter: Payer: Self-pay | Admitting: Family Medicine

## 2014-12-21 VITALS — BP 122/70 | HR 66 | Temp 98.3°F | Wt 128.0 lb

## 2014-12-21 DIAGNOSIS — J208 Acute bronchitis due to other specified organisms: Secondary | ICD-10-CM

## 2014-12-21 MED ORDER — AZITHROMYCIN 500 MG PO TABS: 500.0000 mg | ORAL_TABLET | Freq: Every day | ORAL | Status: DC

## 2014-12-21 NOTE — Patient Instructions (Signed)
Stick with the Robitussin-DM and use Claritin

## 2014-12-21 NOTE — Progress Notes (Signed)
   Subjective:    Patient ID: Mary [name], female    DOB: 1927/03/03, 81 y.o.   MRN: [id number]  HPI She complains of a 5 week history of difficulty that started with postnasal drainage followed by a dry cough. She did state she got better however within the last week she has had more chest congestion and slight sore throat but no fever or chills. She also complains of slight ear congestion.   Review of Systems     Objective:   Physical Exam Alert and in no distress. Tympanic membranes and canals are normal. Pharyngeal area is normal. Neck is supple without adenopathy or thyromegaly. Cardiac exam shows a regular sinus rhythm without murmurs or gallops. Lungs are clear to auscultation.        Assessment & Plan:  Acute bronchitis due to other specified organisms - Plan: azithromycin (ZITHROMAX) 500 MG tablet  she has tried azithromycin in the past and this is work. Conseled her to take the medication but if not better in 1 week, to call.

## 2015-01-02 DIAGNOSIS — Z23 Encounter for immunization: Secondary | ICD-10-CM | POA: Diagnosis not present

## 2015-01-06 DIAGNOSIS — H6123 Impacted cerumen, bilateral: Secondary | ICD-10-CM | POA: Diagnosis not present

## 2015-01-08 DIAGNOSIS — N312 Flaccid neuropathic bladder, not elsewhere classified: Secondary | ICD-10-CM | POA: Diagnosis not present

## 2015-01-13 DIAGNOSIS — R3989 Other symptoms and signs involving the genitourinary system: Secondary | ICD-10-CM | POA: Diagnosis not present

## 2015-01-13 DIAGNOSIS — R338 Other retention of urine: Secondary | ICD-10-CM | POA: Diagnosis not present

## 2015-01-14 ENCOUNTER — Encounter: Payer: Self-pay | Admitting: *Deleted

## 2015-01-18 DIAGNOSIS — Z853 Personal history of malignant neoplasm of breast: Secondary | ICD-10-CM | POA: Diagnosis not present

## 2015-01-18 LAB — HM MAMMOGRAPHY: HM Mammogram: NEGATIVE

## 2015-01-19 DIAGNOSIS — N312 Flaccid neuropathic bladder, not elsewhere classified: Secondary | ICD-10-CM | POA: Diagnosis not present

## 2015-01-19 DIAGNOSIS — R338 Other retention of urine: Secondary | ICD-10-CM | POA: Diagnosis not present

## 2015-02-05 DIAGNOSIS — N3512 Postinfective urethral stricture, not elsewhere classified, female: Secondary | ICD-10-CM | POA: Diagnosis not present

## 2015-02-05 DIAGNOSIS — R338 Other retention of urine: Secondary | ICD-10-CM | POA: Diagnosis not present

## 2015-02-05 DIAGNOSIS — N312 Flaccid neuropathic bladder, not elsewhere classified: Secondary | ICD-10-CM | POA: Diagnosis not present

## 2015-02-05 DIAGNOSIS — N1370 Vesicoureteral-reflux, unspecified: Secondary | ICD-10-CM | POA: Diagnosis not present

## 2015-02-08 ENCOUNTER — Telehealth: Payer: Self-pay | Admitting: Family Medicine

## 2015-02-08 ENCOUNTER — Other Ambulatory Visit: Payer: Self-pay | Admitting: Urology

## 2015-02-08 NOTE — Telephone Encounter (Signed)
There is no record of this medication in the computer--what is the dose, and what does she take it for? When was it last filled?

## 2015-02-08 NOTE — Telephone Encounter (Signed)
[place] for #10, 1 po every 8 hours as needed for spasm or anxiety, no refill

## 2015-02-08 NOTE — Telephone Encounter (Signed)
She states that her rx is so old, she doesn't even know who wrote it. It is 2mg  BRAND name Valium and she occasionally takes for anxiety and sometimes helps calm her bladder when needed.

## 2015-02-08 NOTE — Telephone Encounter (Signed)
Called patient back to let her know that #10 would be called in . She told me that was ridiculous and why would you ever call in such a small amount? Just kept telling me how ridicuous that was. Said her last rx was #30 and that is what she would like. I explained to her that she stated that she uses them infrequently and it should be enough. She wanted me to send you a message.

## 2015-02-08 NOTE — Telephone Encounter (Signed)
Pt called for refill of valium. She says she doesn't take it very often but only has about 6 or 7 left. Please send into [facility] [address]. Pt can be reached at [phone].

## 2015-02-08 NOTE — Telephone Encounter (Signed)
Please check with her pharmacy to see who/when/how many were prescribed in the past.  I was trying to do her a favor--if they were truly very old, I didn't want them to be wasted/expired/thrown out. I'm happy to reconsider a larger amount, but would like to know when they were last filled, how many (and by whom, as I'm not sure it was me, at least not in the last 3 years per computer).

## 2015-02-09 ENCOUNTER — Telehealth: Payer: Self-pay

## 2015-02-09 NOTE — Telephone Encounter (Signed)
This was probably Firth, who is her nephew, a dermatopathologist in [place].  So the #30 that she was given has lasted her for 16 months, and she still has 6 or 7.  I truly don't know what she is so upset about only being given 10, which will clearly last her a very long time.  Amber did let her know that she won't get a call back today, so I will give her a call when I'm in the office tomorrow, when I get a chance. Unless she has called back again today, I think this can wait until tomorrow.  Thanks.

## 2015-02-09 NOTE — Telephone Encounter (Signed)
Please make sure she is aware that she will not be getting a call back until tomorrow (did you already let her know that? Not documented).

## 2015-02-09 NOTE — Telephone Encounter (Signed)
Yes, I told her you wouldn't be back until tomorrow and she most likely wouldn't hear anything back until then.

## 2015-02-09 NOTE — Telephone Encounter (Signed)
I called pharmacy and was advised she received #30 from Dr. Lucita Ferrara in July 2015.  Please advise

## 2015-02-09 NOTE — Telephone Encounter (Signed)
Pt is upset she only got 10 Valium pills. She was expectnig at least 20-25 to last her a long time. She would like a call back from Dr. Tomi Bamberger to discuss this.

## 2015-02-10 NOTE — Telephone Encounter (Signed)
Detailed message left on her cell phone voicemail. Stating that I thought 10 tablets would be sufficient to last at least 6 months, based on her usage pattern, and knowing that she still had some left.  I apologized if this created any stress for her, that wasn't my intent, and if/when she needed more, to let us know and we would refill it (which shouldn't be anytime soon).

## 2015-02-13 ENCOUNTER — Encounter (HOSPITAL_COMMUNITY): Payer: Self-pay

## 2015-02-13 ENCOUNTER — Emergency Department (HOSPITAL_COMMUNITY)
Admission: EM | Admit: 2015-02-13 | Discharge: 2015-02-13 | Disposition: A | Discharge status: Home / Self Care | Payer: Medicare Other | Attending: Emergency Medicine | Admitting: Emergency Medicine

## 2015-02-13 DIAGNOSIS — Z85828 Personal history of other malignant neoplasm of skin: Secondary | ICD-10-CM | POA: Diagnosis not present

## 2015-02-13 DIAGNOSIS — Z8669 Personal history of other diseases of the nervous system and sense organs: Secondary | ICD-10-CM | POA: Insufficient documentation

## 2015-02-13 DIAGNOSIS — Z7952 Long term (current) use of systemic steroids: Secondary | ICD-10-CM | POA: Diagnosis not present

## 2015-02-13 DIAGNOSIS — Z9889 Other specified postprocedural states: Secondary | ICD-10-CM | POA: Diagnosis not present

## 2015-02-13 DIAGNOSIS — Z86010 Personal history of colonic polyps: Secondary | ICD-10-CM | POA: Diagnosis not present

## 2015-02-13 DIAGNOSIS — E782 Mixed hyperlipidemia: Secondary | ICD-10-CM | POA: Diagnosis not present

## 2015-02-13 DIAGNOSIS — Z789 Other specified health status: Secondary | ICD-10-CM | POA: Diagnosis not present

## 2015-02-13 DIAGNOSIS — R339 Retention of urine, unspecified: Secondary | ICD-10-CM | POA: Diagnosis not present

## 2015-02-13 DIAGNOSIS — Z880 Allergy status to penicillin: Secondary | ICD-10-CM | POA: Diagnosis not present

## 2015-02-13 DIAGNOSIS — Z9049 Acquired absence of other specified parts of digestive tract: Secondary | ICD-10-CM | POA: Diagnosis not present

## 2015-02-13 DIAGNOSIS — Z853 Personal history of malignant neoplasm of breast: Secondary | ICD-10-CM | POA: Insufficient documentation

## 2015-02-13 DIAGNOSIS — M810 Age-related osteoporosis without current pathological fracture: Secondary | ICD-10-CM | POA: Insufficient documentation

## 2015-02-13 DIAGNOSIS — R000 Tachycardia, unspecified: Secondary | ICD-10-CM | POA: Diagnosis not present

## 2015-02-13 DIAGNOSIS — K5900 Constipation, unspecified: Secondary | ICD-10-CM | POA: Insufficient documentation

## 2015-02-13 DIAGNOSIS — Z79899 Other long term (current) drug therapy: Secondary | ICD-10-CM | POA: Insufficient documentation

## 2015-02-13 DIAGNOSIS — I10 Essential (primary) hypertension: Secondary | ICD-10-CM | POA: Insufficient documentation

## 2015-02-13 DIAGNOSIS — Z87448 Personal history of other diseases of urinary system: Secondary | ICD-10-CM | POA: Insufficient documentation

## 2015-02-13 DIAGNOSIS — Z8742 Personal history of other diseases of the female genital tract: Secondary | ICD-10-CM | POA: Diagnosis not present

## 2015-02-13 DIAGNOSIS — R300 Dysuria: Secondary | ICD-10-CM | POA: Insufficient documentation

## 2015-02-13 LAB — URINALYSIS, ROUTINE W REFLEX MICROSCOPIC
Bilirubin Urine: NEGATIVE
GLUCOSE, UA: NEGATIVE mg/dL
KETONES UR: NEGATIVE mg/dL
NITRITE: NEGATIVE
PROTEIN: NEGATIVE mg/dL
Specific Gravity, Urine: 1.006 (ref 1.005–1.030)
pH: 7.5 (ref 5.0–8.0)

## 2015-02-13 LAB — URINE MICROSCOPIC-ADD ON
Bacteria, UA: NONE SEEN
SQUAMOUS EPITHELIAL / LPF: NONE SEEN

## 2015-02-13 NOTE — ED Provider Notes (Signed)
CSN: [id number]     Arrival date & time 02/13/15  1131 History   First MD Initiated Contact with Patient 02/13/15 1158     Chief Complaint  Patient presents with  . Urinary Retention     (Consider location/radiation/quality/duration/timing/severity/associated sxs/prior Treatment) HPI Comments: 81 year old female with past medical history including hypotonic bladder, hyperlipidemia, hemicolectomy who presents with urinary retention. The patient follows with Dr. [name] in the clinic and has been scheduled on 03/01/15 for a cystoscopy and bladder biopsy. This morning, she urinated at approximately 10 AM and then 30 minutes later to try to go again but was unable to do so. She has not urinated since this time despite feeling like she needs to empty her bladder. She states that she is overall felt well recently with no fevers, vomiting, or abdominal pain. She does endorse mild dysuria for the past few days.  The history is provided by the patient.    Past Medical History  Diagnosis Date  . Heart palpitations   . Breast cancer ([place]) 10/07    right; invasive ductal CA; Dr. Truddie Coco  . Impaired fasting glucose   . Mixed hyperlipidemia   . Constipation   . External hemorrhoid   . Adenomatous colon polyp 3/03  . Colonic mass 2007    high grade dysplasia (s/p L hemicolectomy)  . BCC (basal cell carcinoma), face 8/06    L nasolabial fold  . CTS (carpal tunnel syndrome)     right  . Osteoporosis     DEXA 01/2009; T-3.2 L fem neck, -2.4 L radius  . Ashkenazi Jewish ancestry   . Hypotonic bladder 01/08/15    Dr. [name]   Past Surgical History  Procedure Laterality Date  . Mastectomy  12/07    RIGHT BREAST  . Colon surgery  6/07    high grade dysplasia; neg LN; lap-assisted surgery  . Appendectomy  age 89  . Tonsillectomy  age 51  . Cataract extraction, bilateral  R '93; L '91  . Carpal tunnel release  2/09    Right; Dr. Burney Gauze  . Trigger finger release  R 2/09; L 5/09    Dr.  Burney Gauze  . Colonoscopy  05/20/2008    repeat due 05/2011; pt states she had in 2013 ([place])  . Partial knee arthroplasty Right 01/24/2013    Procedure: RIGHT KNEE MEDIAL UNICOMPARTMENTAL ARTHROPLASTY;  Surgeon: Gearlean Alf, MD;  Location: [facility];  Service: Orthopedics;  Laterality: Right;   Family History  Problem Relation Age of Onset  . Hypertension Mother   . Stroke Father   . Diabetes Father   . Hyperlipidemia Brother   . Breast cancer Paternal Aunt     dx 73s; deceased early 18s  . Breast cancer Cousin     paternal first cousin  . Colon cancer Cousin     paternal first cousin; age dx 10s  . Hyperlipidemia Son    Social History  Substance Use Topics  . Smoking status: Never Smoker   . Smokeless tobacco: Never Used  . Alcohol Use: Yes     Comment: Very seldom   OB History    Gravida Para Term Preterm AB TAB SAB Ectopic Multiple Living   3 3        3      Review of Systems 10 Systems reviewed and are negative for acute change except as noted in the HPI.    Allergies  Avelox; Biaxin; Clarithromycin; Arimidex; Bactrim; Penicillins; and Sulfa drugs cross reactors  Home  Medications   Prior to Admission medications   Medication Sig Start Date End Date Taking? Authorizing Provider  atorvastatin (LIPITOR) 40 MG tablet Take 1 tablet (40 mg total) by mouth every evening. 04/15/14  Yes Rita Ohara, MD  bethanechol (URECHOLINE) 25 MG tablet Take 25 mg by mouth 3 (three) times daily.    Yes Historical Provider, MD  Calcium Carbonate-Vitamin D (CALCIUM 600+D) 600-400 MG-UNIT per tablet Take 1 tablet by mouth 2 (two) times daily.    Yes Historical Provider, MD  Cholecalciferol (VITAMIN D3) 2000 UNITS TABS Take 2,000 Units by mouth daily.   Yes Historical Provider, MD  denosumab (PROLIA) 60 MG/ML SOLN injection Inject 60 mg into the skin See admin instructions. Administer in upper arm, thigh, or abdomen 10/24/12  Yes Historical Provider, MD  hydrocortisone (ANUSOL-HC) 2.5 % rectal  cream Place 1 application rectally 2 (two) times daily. 04/15/14  Yes Rita Ohara, MD  LANOXIN 125 MCG tablet Take 1 tablet (125 mcg total) by mouth daily. 04/15/14  Yes Rita Ohara, MD  loratadine (CLARITIN) 10 MG tablet Take 10 mg by mouth daily as needed for allergies.    Yes Historical Provider, MD  Multiple Vitamin (MULTIVITAMIN WITH MINERALS) TABS tablet Take 1 tablet by mouth daily.   Yes Historical Provider, MD  Multiple Vitamins-Minerals (ICAPS AREDS 2) CAPS Take 1 capsule by mouth 2 (two) times daily.   Yes Historical Provider, MD  polyethylene glycol (MIRALAX / GLYCOLAX) packet Take 17 g by mouth daily as needed for moderate constipation.    Yes Historical Provider, MD  verapamil (CALAN-SR) 240 MG CR tablet Take 1 tablet (240 mg total) by mouth daily. 10/05/14  Yes Rita Ohara, MD  azithromycin (ZITHROMAX) 500 MG tablet Take 1 tablet (500 mg total) by mouth daily. Patient not taking: Reported on 02/13/2015 12/21/14   Denita Lung, MD   BP 130/68 mmHg  Pulse 82  Temp(Src) 97.9 F (36.6 C) (Oral)  Resp 18  SpO2 100% Physical Exam  Constitutional: She is oriented to person, place, and time. She appears well-developed and well-nourished. No distress.  HENT:  Head: Normocephalic and atraumatic.  Moist mucous membranes  Eyes: Conjunctivae are normal. Pupils are equal, round, and reactive to light.  Neck: Neck supple.  Cardiovascular: Normal rate, regular rhythm and normal heart sounds.   No murmur heard. Pulmonary/Chest: Effort normal and breath sounds normal.  Abdominal: Soft. Bowel sounds are normal. She exhibits no distension. There is no tenderness.  Genitourinary:  Foley catheter in place draining clear urine  Musculoskeletal: She exhibits no edema.  Neurological: She is alert and oriented to person, place, and time.  Fluent speech  Skin: Skin is warm and dry.  Psychiatric: She has a normal mood and affect. Judgment normal.  Nursing note and vitals reviewed.   ED Course   Procedures (including critical care time) Labs Review Labs Reviewed  URINALYSIS, ROUTINE W REFLEX MICROSCOPIC (NOT AT [facility]) - Abnormal; Notable for the following:    Hgb urine dipstick TRACE (*)    Leukocytes, UA TRACE (*)    All other components within normal limits  URINE CULTURE  URINE MICROSCOPIC-ADD ON    Imaging Review No results found. I have personally reviewed and evaluated these lab results as part of my medical decision-making.    MDM   Final diagnoses:  Urinary retention    Patient presents with urinary retention that began this morning in the setting of a few days of dysuria. On arrival to the ED, the  patient was hypertensive and tachycardic and in significant discomfort due to bladder distention. She had almost 1049ml urine on bladder scan. Foley catheter placed with immediate improvement in symptoms. On my examination, the Foley was draining clear urine and the patient was comfortable with no abdominal tenderness and no complaints. Obtained UA given reports of dysuria. UA negative for infection.  Instructed patient to follow-up with her urologist on Monday for further instructions. Nursing provided Foley supplies and instructions on use. Patient voiced understanding of plan as well as return precautions. Patient discharged in satisfactory condition.  Sharlett Iles, MD 02/13/15 [phone]

## 2015-02-13 NOTE — Discharge Instructions (Signed)
Acute Urinary Retention, Female °Acute urinary retention is the temporary inability to urinate. This is an uncommon problem in women. It can be caused by: °· Infection. °· A side effect of a medicine. °· A problem in a nearby organ that presses or squeezes on the bladder or the urethra (the tube that drains the bladder). °· Psychological problems. °·  Surgery on your bladder, urethra, or pelvic organs that causes obstruction to the outflow of urine from your bladder. °HOME CARE INSTRUCTIONS  °If you are sent home with a Foley catheter and a drainage system, you will need to discuss the best course of action with your health care provider. While the catheter is in, maintain a good intake of fluids. Keep the drainage bag emptied and lower than your catheter. This is so that contaminated urine will not flow back into your bladder, which could lead to a urinary tract infection. °There are two main types of drainage bags. One is a large bag that usually is used at night. It has a good capacity that will allow you to sleep through the night without having to empty it. The second type is called a leg bag. It has a smaller capacity so it needs to be emptied more frequently. However, the main advantage is that it can be attached by a leg strap and goes underneath your clothing, allowing you the freedom to move about or leave your home. °Only take over-the-counter or prescription medicines for pain, discomfort, or fever as directed by your health care provider.  °SEEK MEDICAL CARE IF: °· You develop a low-grade fever. °· You experience spasms or leakage of urine with the spasms. °SEEK IMMEDIATE MEDICAL CARE IF:  °· You develop chills or fever. °· Your catheter stops draining urine. °· Your catheter falls out. °· You start to develop increased bleeding that does not respond to rest and increased fluid intake. °MAKE SURE YOU: °· Understand these instructions. °· Will watch your condition. °· Will get help right away if you are  not doing well or get worse. °  °This information is not intended to replace advice given to you by your health care provider. Make sure you discuss any questions you have with your health care provider. °  °Document Released: 03/05/2006 Document Revised: 07/21/2014 Document Reviewed: 08/15/2012 °Elsevier Interactive Patient Education ©2016 Elsevier Inc. ° °

## 2015-02-13 NOTE — ED Notes (Signed)
Pt presents with c/o urinary retention. Pt reports that she went to the bathroom early this morning with no problem around 10:00 but after that she tried to go again and was unable. Pt reports she is a pt of Dr. [name] and is scheduled to have a cystoscopy, a cold cup bladder biopsy, and cauterization of biopsy sites on 03/01/15.

## 2015-02-15 ENCOUNTER — Telehealth: Payer: Self-pay | Admitting: Family Medicine

## 2015-02-15 MED ORDER — VALIUM 2 MG PO TABS: 2.0000 mg | ORAL_TABLET | Freq: Three times a day (TID) | ORAL | Status: DC | PRN

## 2015-02-15 NOTE — Telephone Encounter (Signed)
Had urodynamic studies in early November.  They left the catheter in after the study, and a "white thing" was found, and they are planning cystoscopy and biopsy.  This is scheduled for 12/12.  She asked Dr. [name] about refilling the Valium, who said he would not prescribe it, should come from her PCP (is not really for her bladder, but for anxiety and pt thinks the anxiety triggers the bladder retention).  Plans to take prior to her surgery.  Takes it for her anxiety.  It clearly makes her very anxious to only have 10, when she knows she might need it a little more than typical due to upcoming procedure, and recent recurrent problem requiring ER visit for retention this past weekend.  Every year and a half she will have this problem where she can't void, and she attributes this to anxiety (happened the day her sister-in-law passed away).   Brand name Valium 2mg  #30 was phoned in for her (and this should last quite a while).  She was very relieved. Discussed the reasoning behind only originally wanting to give 10, which I still feel is very reasonable, but I also wasn't aware of the upcoming procedure that she was having, that was making her more anxious (about not having enough).

## 2015-02-15 NOTE — Telephone Encounter (Signed)
Pt called requesting to speak to Dr Tomi Bamberger about the #10 Valium and express her concerns about why she needs more. Pt says Dr Tomi Bamberger tried to call her over the holiday so she wants to talk to her further.

## 2015-02-16 DIAGNOSIS — Z13820 Encounter for screening for osteoporosis: Secondary | ICD-10-CM | POA: Diagnosis not present

## 2015-02-16 DIAGNOSIS — M810 Age-related osteoporosis without current pathological fracture: Secondary | ICD-10-CM | POA: Diagnosis not present

## 2015-02-16 DIAGNOSIS — Z853 Personal history of malignant neoplasm of breast: Secondary | ICD-10-CM | POA: Diagnosis not present

## 2015-02-16 LAB — HM DEXA SCAN

## 2015-02-16 LAB — URINE CULTURE
Culture: 20000
Special Requests: NORMAL

## 2015-02-17 ENCOUNTER — Telehealth (HOSPITAL_BASED_OUTPATIENT_CLINIC_OR_DEPARTMENT_OTHER): Payer: Self-pay | Admitting: Emergency Medicine

## 2015-02-17 NOTE — Telephone Encounter (Signed)
Post ED Visit - Positive Culture Follow-up  Culture report reviewed by antimicrobial stewardship pharmacist:  []  Elenor Quinones, Pharm.D. []  Heide Guile, Pharm.D., BCPS []  Parks Neptune, Pharm.D. []  Alycia Rossetti, Pharm.D., BCPS []  [place], Pharm.D., BCPS, AAHIVP []  Legrand Como, Pharm.D., BCPS, AAHIVP []  Milus Glazier, Pharm.D. [x]  Stephens November, [place].D.  Positive urine culture Staph Treated with none, asymptomatic, organism sensitive to the same and no further patient follow-up is required at this time.  Hazle Nordmann 02/17/2015, 2:18 PM

## 2015-02-17 NOTE — Progress Notes (Signed)
ED Antimicrobial Stewardship Positive Culture Follow Up   Mary [name] is an 81 y.o. female who presented to [facility] on 02/13/2015 with a chief complaint of  Chief Complaint  Patient presents with  . Urinary Retention    Recent Results (from the past 720 hour(s))  Urine culture     Status: None   Collection Time: 02/13/15 12:55 PM  Result Value Ref Range Status   Specimen Description URINE, CATHETERIZED  Final   Special Requests Normal  Final   Culture   Final    20,000 COLONIES/mL STAPHYLOCOCCUS SPECIES (COAGULASE NEGATIVE) Performed at [facility]    Report Status 02/16/2015 FINAL  Final   Organism ID, Bacteria STAPHYLOCOCCUS SPECIES (COAGULASE NEGATIVE)  Final      Susceptibility   Staphylococcus species (coagulase negative) - MIC*    CIPROFLOXACIN >=8 RESISTANT Resistant     GENTAMICIN <=0.5 SENSITIVE Sensitive     NITROFURANTOIN <=16 SENSITIVE Sensitive     OXACILLIN <=0.25 SENSITIVE Sensitive     TETRACYCLINE 2 SENSITIVE Sensitive     VANCOMYCIN 2 SENSITIVE Sensitive     TRIMETH/SULFA >=320 RESISTANT Resistant     CLINDAMYCIN <=0.25 RESISTANT Resistant     RIFAMPIN <=0.5 SENSITIVE Sensitive     Inducible Clindamycin POSITIVE Resistant     * 20,000 COLONIES/mL STAPHYLOCOCCUS SPECIES (COAGULASE NEGATIVE)    Presents with urinary retention. Pt has a long history of symptom with multiple ED visits for such. UA unremarkable with 20K CFU CoNS. Discussed with Margarita Mail, PA-C and agrees no treatment necessary.  ED Provider: Margarita Mail, PA-C   Judieth Keens, PharmD 02/17/2015, 10:17 AM PGY1 Resident Phone# [phone]

## 2015-02-23 ENCOUNTER — Encounter (HOSPITAL_BASED_OUTPATIENT_CLINIC_OR_DEPARTMENT_OTHER): Payer: Self-pay | Admitting: *Deleted

## 2015-02-23 NOTE — Progress Notes (Signed)
NPO AFTER MN.  ARRIVE AT 1015.  NEEDS HG AND EKG.  WILL TAKE VERAPAMIL AND LANOXIN AM DOS W/ SIPS OF WATER.

## 2015-02-24 ENCOUNTER — Other Ambulatory Visit: Payer: Self-pay | Admitting: Family Medicine

## 2015-02-24 NOTE — Telephone Encounter (Signed)
Is this okay to refill? Due for med check plus Jan(last 04/15/14), nothing scheduled and nothing available until late summer. What should I do?

## 2015-02-24 NOTE — Telephone Encounter (Signed)
Schedule med check in January/Feb, and put on cancellation list for AWV/med check+ in case one opens up. Ok to refill #90

## 2015-03-01 ENCOUNTER — Encounter (HOSPITAL_BASED_OUTPATIENT_CLINIC_OR_DEPARTMENT_OTHER)
Admission: RE | Disposition: A | Discharge status: Home / Self Care | Payer: Self-pay | Source: Ambulatory Visit | Attending: Urology

## 2015-03-01 ENCOUNTER — Ambulatory Visit (HOSPITAL_BASED_OUTPATIENT_CLINIC_OR_DEPARTMENT_OTHER): Payer: Medicare Other | Admitting: Anesthesiology

## 2015-03-01 ENCOUNTER — Encounter (HOSPITAL_BASED_OUTPATIENT_CLINIC_OR_DEPARTMENT_OTHER): Payer: Self-pay

## 2015-03-01 ENCOUNTER — Ambulatory Visit (HOSPITAL_BASED_OUTPATIENT_CLINIC_OR_DEPARTMENT_OTHER)
Admission: RE | Admit: 2015-03-01 | Discharge: 2015-03-01 | Disposition: A | Discharge status: Home / Self Care | Payer: Medicare Other | Source: Ambulatory Visit | Attending: Urology | Admitting: Urology

## 2015-03-01 ENCOUNTER — Other Ambulatory Visit: Payer: Self-pay

## 2015-03-01 DIAGNOSIS — N3512 Postinfective urethral stricture, not elsewhere classified, female: Secondary | ICD-10-CM | POA: Insufficient documentation

## 2015-03-01 DIAGNOSIS — N3020 Other chronic cystitis without hematuria: Secondary | ICD-10-CM | POA: Insufficient documentation

## 2015-03-01 DIAGNOSIS — D494 Neoplasm of unspecified behavior of bladder: Secondary | ICD-10-CM | POA: Diagnosis not present

## 2015-03-01 DIAGNOSIS — N3644 Muscular disorders of urethra: Secondary | ICD-10-CM | POA: Insufficient documentation

## 2015-03-01 DIAGNOSIS — M1990 Unspecified osteoarthritis, unspecified site: Secondary | ICD-10-CM | POA: Insufficient documentation

## 2015-03-01 DIAGNOSIS — R339 Retention of urine, unspecified: Secondary | ICD-10-CM | POA: Diagnosis not present

## 2015-03-01 DIAGNOSIS — I10 Essential (primary) hypertension: Secondary | ICD-10-CM | POA: Insufficient documentation

## 2015-03-01 DIAGNOSIS — Z853 Personal history of malignant neoplasm of breast: Secondary | ICD-10-CM | POA: Diagnosis not present

## 2015-03-01 DIAGNOSIS — N312 Flaccid neuropathic bladder, not elsewhere classified: Secondary | ICD-10-CM | POA: Insufficient documentation

## 2015-03-01 DIAGNOSIS — N3289 Other specified disorders of bladder: Secondary | ICD-10-CM | POA: Diagnosis present

## 2015-03-01 DIAGNOSIS — Z79899 Other long term (current) drug therapy: Secondary | ICD-10-CM | POA: Diagnosis not present

## 2015-03-01 DIAGNOSIS — E7800 Pure hypercholesterolemia, unspecified: Secondary | ICD-10-CM | POA: Diagnosis not present

## 2015-03-01 DIAGNOSIS — N3000 Acute cystitis without hematuria: Secondary | ICD-10-CM | POA: Diagnosis not present

## 2015-03-01 DIAGNOSIS — N1370 Vesicoureteral-reflux, unspecified: Secondary | ICD-10-CM | POA: Insufficient documentation

## 2015-03-01 DIAGNOSIS — N3281 Overactive bladder: Secondary | ICD-10-CM | POA: Diagnosis not present

## 2015-03-01 DIAGNOSIS — Z7982 Long term (current) use of aspirin: Secondary | ICD-10-CM | POA: Insufficient documentation

## 2015-03-01 HISTORY — DX: Personal history of adenomatous and serrated colon polyps: Z86.0101

## 2015-03-01 HISTORY — DX: Unspecified osteoarthritis, unspecified site: M19.90

## 2015-03-01 HISTORY — PX: CYSTOSCOPY WITH BIOPSY: SHX5122

## 2015-03-01 HISTORY — DX: Personal history of colonic polyps: Z86.010

## 2015-03-01 HISTORY — DX: Personal history of other malignant neoplasm of skin: Z98.890

## 2015-03-01 HISTORY — DX: Retention of urine, unspecified: R33.9

## 2015-03-01 HISTORY — DX: Personal history of malignant neoplasm of breast: Z85.3

## 2015-03-01 HISTORY — DX: Other specified postprocedural states: Z85.828

## 2015-03-01 HISTORY — DX: Personal history of other benign neoplasm: Z86.018

## 2015-03-01 HISTORY — DX: Presence of other specified devices: Z97.8

## 2015-03-01 HISTORY — DX: Presence of urogenital implants: Z96.0

## 2015-03-01 HISTORY — DX: Personal history of other diseases of the respiratory system: Z87.09

## 2015-03-01 LAB — POCT HEMOGLOBIN-HEMACUE: HEMOGLOBIN: 14.4 g/dL (ref 12.0–15.0)

## 2015-03-01 SURGERY — CYSTOSCOPY, WITH BIOPSY
Anesthesia: General

## 2015-03-01 MED ORDER — ONDANSETRON HCL 4 MG/2ML IJ SOLN
INTRAMUSCULAR | Status: AC
  Filled 2015-03-01: qty 2

## 2015-03-01 MED ORDER — PROPOFOL 10 MG/ML IV BOLUS
INTRAVENOUS | Status: AC
  Filled 2015-03-01: qty 40

## 2015-03-01 MED ORDER — KETOROLAC TROMETHAMINE 15 MG/ML IJ SOLN
INTRAMUSCULAR | Status: DC | PRN
  Administered 2015-03-01: 15 mg via INTRAVENOUS

## 2015-03-01 MED ORDER — TRAMADOL-ACETAMINOPHEN 37.5-325 MG PO TABS: 1.0000 | ORAL_TABLET | Freq: Four times a day (QID) | ORAL | Status: DC | PRN

## 2015-03-01 MED ORDER — KETOROLAC TROMETHAMINE 30 MG/ML IJ SOLN
INTRAMUSCULAR | Status: AC
  Filled 2015-03-01: qty 1

## 2015-03-01 MED ORDER — MIDAZOLAM HCL 2 MG/2ML IJ SOLN
INTRAMUSCULAR | Status: AC
  Filled 2015-03-01: qty 2

## 2015-03-01 MED ORDER — DEXAMETHASONE SODIUM PHOSPHATE 4 MG/ML IJ SOLN
INTRAMUSCULAR | Status: DC | PRN
Start: 2015-03-01 — End: 2015-03-01
  Administered 2015-03-01: 10 mg via INTRAVENOUS

## 2015-03-01 MED ORDER — ONDANSETRON HCL 4 MG/2ML IJ SOLN
INTRAMUSCULAR | Status: DC | PRN
  Administered 2015-03-01: 4 mg via INTRAVENOUS

## 2015-03-01 MED ORDER — DEXAMETHASONE SODIUM PHOSPHATE 10 MG/ML IJ SOLN
INTRAMUSCULAR | Status: AC
  Filled 2015-03-01: qty 1

## 2015-03-01 MED ORDER — CIPROFLOXACIN IN D5W 400 MG/200ML IV SOLN
400.0000 mg | INTRAVENOUS | Status: AC
  Administered 2015-03-01: 400 mg via INTRAVENOUS
  Filled 2015-03-01: qty 200

## 2015-03-01 MED ORDER — LIDOCAINE HCL 2 % EX GEL
CUTANEOUS | Status: DC | PRN
  Administered 2015-03-01: 1 via URETHRAL

## 2015-03-01 MED ORDER — CIPROFLOXACIN IN D5W 400 MG/200ML IV SOLN
INTRAVENOUS | Status: AC
  Filled 2015-03-01: qty 200

## 2015-03-01 MED ORDER — PROPOFOL 10 MG/ML IV BOLUS
INTRAVENOUS | Status: DC | PRN
  Administered 2015-03-01: 120 mg via INTRAVENOUS
  Administered 2015-03-01: 80 mg via INTRAVENOUS

## 2015-03-01 MED ORDER — FENTANYL CITRATE (PF) 100 MCG/2ML IJ SOLN
INTRAMUSCULAR | Status: AC
  Filled 2015-03-01: qty 4

## 2015-03-01 MED ORDER — EPHEDRINE SULFATE 50 MG/ML IJ SOLN
INTRAMUSCULAR | Status: AC
  Filled 2015-03-01: qty 1

## 2015-03-01 MED ORDER — PHENAZOPYRIDINE HCL 200 MG PO TABS: 200.0000 mg | ORAL_TABLET | Freq: Three times a day (TID) | ORAL | Status: DC | PRN

## 2015-03-01 MED ORDER — LIDOCAINE HCL (CARDIAC) 20 MG/ML IV SOLN
INTRAVENOUS | Status: AC
  Filled 2015-03-01: qty 5

## 2015-03-01 MED ORDER — LACTATED RINGERS IV SOLN
INTRAVENOUS | Status: DC
  Administered 2015-03-01: 10:00:00 via INTRAVENOUS
  Filled 2015-03-01: qty 1000

## 2015-03-01 MED ORDER — LIDOCAINE HCL (CARDIAC) 20 MG/ML IV SOLN
INTRAVENOUS | Status: DC | PRN
Start: 2015-03-01 — End: 2015-03-01
  Administered 2015-03-01: 60 mg via INTRAVENOUS

## 2015-03-01 MED ORDER — EPHEDRINE SULFATE 50 MG/ML IJ SOLN
INTRAMUSCULAR | Status: DC | PRN
Start: 2015-03-01 — End: 2015-03-01
  Administered 2015-03-01: 10 mg via INTRAVENOUS
  Administered 2015-03-01: 15 mg via INTRAVENOUS

## 2015-03-01 MED ORDER — FENTANYL CITRATE (PF) 100 MCG/2ML IJ SOLN
INTRAMUSCULAR | Status: DC | PRN
  Administered 2015-03-01: 50 ug via INTRAVENOUS

## 2015-03-01 MED ORDER — STERILE WATER FOR IRRIGATION IR SOLN
Status: DC | PRN
  Administered 2015-03-01: 3000 mL

## 2015-03-01 MED ORDER — FENTANYL CITRATE (PF) 100 MCG/2ML IJ SOLN
25.0000 ug | INTRAMUSCULAR | Status: DC | PRN
  Filled 2015-03-01: qty 1

## 2015-03-01 SURGICAL SUPPLY — 28 items
BAG DRAIN URO-CYSTO SKYTR STRL (DRAIN) ×3 IMPLANT
BAG DRN UROCATH (DRAIN) ×1
BAG URINE LEG 500ML (DRAIN) ×2 IMPLANT
BOOTIES KNEE HIGH SLOAN (MISCELLANEOUS) ×3 IMPLANT
CANISTER SUCT LVC 12 LTR MEDI- (MISCELLANEOUS) IMPLANT
CATH FOLEY 2WAY SLVR  5CC 16FR (CATHETERS) ×2
CATH FOLEY 2WAY SLVR 5CC 16FR (CATHETERS) IMPLANT
CLOTH BEACON ORANGE TIMEOUT ST (SAFETY) ×3 IMPLANT
ELECT REM PT RETURN 9FT ADLT (ELECTROSURGICAL) ×3
ELECTRODE REM PT RTRN 9FT ADLT (ELECTROSURGICAL) ×1 IMPLANT
GLOVE BIO SURGEON STRL SZ7.5 (GLOVE) ×3 IMPLANT
GOWN STRL REUS W/ TWL LRG LVL3 (GOWN DISPOSABLE) ×1 IMPLANT
GOWN STRL REUS W/ TWL XL LVL3 (GOWN DISPOSABLE) ×1 IMPLANT
GOWN STRL REUS W/TWL LRG LVL3 (GOWN DISPOSABLE) ×3
GOWN STRL REUS W/TWL XL LVL3 (GOWN DISPOSABLE) ×3
KIT ROOM TURNOVER WOR (KITS) ×3 IMPLANT
NDL SAFETY ECLIPSE 18X1.5 (NEEDLE) IMPLANT
NDL SPNL 22GX7 QUINCKE BK (NEEDLE) IMPLANT
NEEDLE HYPO 18GX1.5 SHARP (NEEDLE)
NEEDLE HYPO 22GX1.5 SAFETY (NEEDLE) IMPLANT
NEEDLE SPNL 22GX7 QUINCKE BK (NEEDLE) IMPLANT
NS IRRIG 500ML POUR BTL (IV SOLUTION) IMPLANT
PACK CYSTO (CUSTOM PROCEDURE TRAY) ×3 IMPLANT
SYR 20CC LL (SYRINGE) IMPLANT
SYRINGE 10CC LL (SYRINGE) ×2 IMPLANT
TUBE CONNECTING 12'X1/4 (SUCTIONS)
TUBE CONNECTING 12X1/4 (SUCTIONS) IMPLANT
WATER STERILE IRR 3000ML UROMA (IV SOLUTION) ×3 IMPLANT

## 2015-03-01 NOTE — Discharge Instructions (Addendum)
Foley Catheter Care, Adult A Foley catheter is a soft, flexible tube that is placed into the bladder to drain urine. A Foley catheter may be inserted if:  You leak urine or are not able to control when you urinate (urinary incontinence).  You are not able to urinate when you need to (urinary retention).  You had prostate surgery or surgery on the genitals.  You have certain medical conditions, such as multiple sclerosis, dementia, or a spinal cord injury. If you are going home with a Foley catheter in place, follow the instructions below. TAKING CARE OF THE CATHETER 1. Wash your hands with soap and water. 2. Using mild soap and warm water on a clean washcloth:  Clean the area on your body closest to the catheter insertion site using a circular motion, moving away from the catheter. Never wipe toward the catheter because this could sweep bacteria up into the urethra and cause infection.  Remove all traces of soap. Pat the area dry with a clean towel. For males, reposition the foreskin. 3. Attach the catheter to your leg so there is no tension on the catheter. Use adhesive tape or a leg strap. If you are using adhesive tape, remove any sticky residue left behind by the previous tape you used. 4. Keep the drainage bag below the level of the bladder, but keep it off the floor. 5. Check throughout the day to be sure the catheter is working and urine is draining freely. Make sure the tubing does not become kinked. 6. Do not pull on the catheter or try to remove it. Pulling could damage internal tissues.  Emptying the Drainage Bag You must empty your drainage bag when it is  - full or at least 2-3 times a day. 1. Wash your hands with soap and water. 2. Keep the drainage bag below your hips, below the level of your bladder. This stops urine from going back into the tubing and into your bladder. 3. Hold the dirty bag over the toilet or a clean container. 4. Open the pour spout at the  bottom of the bag and empty the urine into the toilet or container. Do not let the pour spout touch the toilet, container, or any other surface. Doing so can place bacteria on the bag, which can cause an infection. 5. Clean the pour spout with a gauze pad or cotton ball that has rubbing alcohol on it. 6. Close the pour spout. 7. Attach the bag to your leg with adhesive tape or a leg strap. 8. Wash your hands well. Changing the Drainage Bag Change your drainage bag once a month or sooner if it starts to smell bad or look dirty. Below are steps to follow when changing the drainage bag. 1. Wash your hands with soap and water. 2. Pinch off the rubber catheter so that urine does not spill out. 3. Disconnect the catheter tube from the drainage tube at the connection valve. Do not let the tubes touch any surface. 4. Clean the end of the catheter tube with an alcohol wipe. Use a different alcohol wipe to clean the end of the drainage tube. 5. Connect the catheter tube to the drainage tube of the clean drainage bag. 6. Attach the new bag to the leg with adhesive tape or a leg strap. Avoid attaching the new bag too tightly. 7. Wash your hands well. Cleaning the Drainage Bag 1. Wash your hands with soap and water. 2. Wash  the bag in warm, soapy water. 3. Rinse the bag thoroughly with warm water. 4. Fill the bag with a solution of white vinegar and water (1 cup vinegar to 1 qt warm water [.2 L vinegar to 1 L warm water]). Close the bag and soak it for 30 minutes in the solution. 5. Rinse the bag with warm water. 6. Hang the bag to dry with the pour spout open and hanging downward. 7. Store the clean bag (once it is dry) in a clean plastic bag. 8. Wash your hands well. PREVENTING INFECTION  Wash your hands before and after handling your catheter.  Take showers daily and wash the area where the catheter enters your body. Do not take baths. Replace wet leg straps with dry ones, if this applies.  Do not  use powders, sprays, or lotions on the genital area. Only use creams, lotions, or ointments as directed by your caregiver.  For females, wipe from front to back after each bowel movement.  Drink enough fluids to keep your urine clear or pale yellow unless you have a fluid restriction.  Do not let the drainage bag or tubing touch or lie on the floor.  Wear cotton underwear to absorb moisture and to keep your skin drier. SEEK MEDICAL CARE IF:   Your urine is cloudy or smells unusually bad.  Your catheter becomes clogged.  You are not draining urine into the bag or your bladder feels full.  Your catheter starts to leak. SEEK IMMEDIATE MEDICAL CARE IF:   You have pain, swelling, redness, or pus where the catheter enters the body.  You have pain in the abdomen, legs, lower back, or bladder.  You have a fever.  You see blood fill the catheter, or your urine is pink or red.  You have nausea, vomiting, or chills.  Your catheter gets pulled out. MAKE SURE YOU:   Understand these instructions.  Will watch your condition.  Will get help right away if you are not doing well or get worse.   This information is not intended to replace advice given to you by your health care provider. Make sure you discuss any questions you have with your health care provider.   Document Released: 03/06/2005 Document Revised: 07/21/2013 Document Reviewed: 02/26/2012 Elsevier Interactive Patient Education 2016 [place] Anesthesia Home Care Instructionsa car -Operate machinery -Drink alcoholic beverages -Take any medication unless instructed by your physician -Make any legal decisions or sign important papers.  Meals: Start with liquid foods such as gelatin or soup. Progress to regular foods as tolerated. Avoid greasy, spicy, heavy foods. If nausea and/or vomiting occur, drink only clear liquids until the nausea and/or vomiting subsides. Call your physician if vomiting  continues.  Special Instructions/Symptoms: Your throat may feel dry or sore from the anesthesia or the breathing tube placed in your throat during surgery. If this causes discomfort, gargle with warm salt water. The discomfort should disappear within 24 hours.  If you had a scopolamine patch placed behind your ear for the management of post- operative nausea and/or vomiting:  1. The medication in the patch is effective for 72 hours, after which it should be removed.  Wrap patch in a tissue and discard in the trash. Wash hands thoroughly with soap and water. 2. You may remove the patch earlier than 72 hours if you experience unpleasant side effects which may include dry mouth, dizziness or visual disturbances. 3. Avoid touching the patch. Wash your hands with soap and  water after contact with the patch.   CYSTOSCOPY HOME CARE INSTRUCTIONS  Activity: Rest for the remainder of the day.  Do not drive or operate equipment today.  You may resume normal activities in one to two days as instructed by your physician.   Meals: Drink plenty of liquids and eat light foods such as gelatin or soup this evening.  You may return to a normal meal plan tomorrow.  Return to Work: You may return to work in one to two days or as instructed by your physician.  Special Instructions / Symptoms: Call your physician if any of these symptoms occur:   -persistent or heavy bleeding  -bleeding which continues after first few urination  -large blood clots that are difficult to pass  -urine stream diminishes or stops completely  -fever equal to or higher than 101 degrees Farenheit.  -cloudy urine with a strong, foul odor  -severe pain  Females should always wipe from front to back after elimination.  You may feel some burning pain when you urinate.  This should disappear with time.  Applying moist heat to the lower abdomen or a hot tub bath may help relieve the pain. \  Follow-Up / Date of Return Visit to Your  Physician:   Call for an appointment to arrange follow-up.  Patient Signature:  ________________________________________________________  Nurse's Signature:  ________________________________________________________

## 2015-03-01 NOTE — Anesthesia Preprocedure Evaluation (Addendum)
Anesthesia Evaluation  Patient identified by MRN, date of birth, ID band Patient awake    Reviewed: Allergy & Precautions, NPO status , Patient's Chart, lab work & pertinent test results  Airway Mallampati: II  TM Distance: >3 FB Neck ROM: Full    Dental no notable dental hx. (+) Dental Advisory Given, Teeth Intact,    Pulmonary neg pulmonary ROS,    Pulmonary exam normal breath sounds clear to auscultation       Cardiovascular hypertension, Pt. on medications Normal cardiovascular exam Rhythm:Regular Rate:Normal     Neuro/Psych  Neuromuscular disease negative psych ROS   GI/Hepatic negative GI ROS, Neg liver ROS,   Endo/Other  negative endocrine ROS  Renal/GU negative Renal ROS  negative genitourinary   Musculoskeletal  (+) Arthritis ,   Abdominal   Peds negative pediatric ROS (+)  Hematology negative hematology ROS (+)   Anesthesia Other Findings   Reproductive/Obstetrics negative OB ROS                            Anesthesia Physical Anesthesia Plan  ASA: III  Anesthesia Plan: General   Post-op Pain Management:    Induction: Intravenous  Airway Management Planned: LMA  Additional Equipment:   Intra-op Plan:   Post-operative Plan: Extubation in OR  Informed Consent: I have reviewed the patients History and Physical, chart, labs and discussed the procedure including the risks, benefits and alternatives for the proposed anesthesia with the patient or authorized representative who has indicated his/her understanding and acceptance.   Dental advisory given  Plan Discussed with: CRNA  Anesthesia Plan Comments:         Anesthesia Quick Evaluation

## 2015-03-01 NOTE — Anesthesia Postprocedure Evaluation (Signed)
Anesthesia Post Note  Patient: Mary [name]  Procedure(s) Performed: Procedure(s) (LRB): CYSTOSCOPY WITH TAUBER BIOPSY OF 1CM RIGHT LATERAL BLADDER WALL AND CAUTERIZATION OF BIOPSY SITE  (N/A)  Patient location during evaluation: PACU Anesthesia Type: General Level of consciousness: awake and alert Pain management: pain level controlled Vital Signs Assessment: post-procedure vital signs reviewed and stable Respiratory status: spontaneous breathing, nonlabored ventilation, respiratory function stable and patient connected to nasal cannula oxygen Cardiovascular status: blood pressure returned to baseline and stable Postop Assessment: no signs of nausea or vomiting Anesthetic complications: no    Last Vitals:  Filed Vitals:   03/01/15 1430 03/01/15 1445  BP: 125/60 128/66  Pulse: 71 65  Temp:    Resp: 15 13    Last Pain: There were no vitals filed for this visit.               Corwin Kuiken L

## 2015-03-01 NOTE — Transfer of Care (Signed)
Immediate Anesthesia Transfer of Care Note  Patient: Mary [name]  Procedure(s) Performed: Procedure(s) with comments: CYSTOSCOPY WITH TAUBER BIOPSY OF 1CM RIGHT LATERAL BLADDER WALL AND CAUTERIZATION OF BIOPSY SITE  (N/A) - [phone] HOME [phone]  [facility] [id number] D AARP [id number]  Patient Location: PACU  Anesthesia Type:General  Level of Consciousness: awake, alert , oriented and patient cooperative  Airway & Oxygen Therapy: Patient Spontanous Breathing and Patient connected to nasal cannula oxygen  Post-op Assessment: Report given to RN and Post -op Vital signs reviewed and stable  Post vital signs: Reviewed and stable  Last Vitals:  Filed Vitals:   03/01/15 0940  BP: 142/81  Pulse: 87  Temp: 37.1 C  Resp: 12    Complications: No apparent anesthesia complications

## 2015-03-01 NOTE — Anesthesia Procedure Notes (Signed)
Procedure Name: LMA Insertion Date/Time: 03/01/2015 12:58 PM Performed by: Wanita Chamberlain Pre-anesthesia Checklist: Patient identified, Timeout performed, Emergency Drugs available, Suction available and Patient being monitored Patient Re-evaluated:Patient Re-evaluated prior to inductionOxygen Delivery Method: Circle system utilized Preoxygenation: Pre-oxygenation with 100% oxygen Intubation Type: IV induction Ventilation: Mask ventilation without difficulty LMA: LMA inserted LMA Size: 4.0 Number of attempts: 1 Airway Equipment and Method: Bite block Placement Confirmation: positive ETCO2 and breath sounds checked- equal and bilateral Tube secured with: Tape Dental Injury: Teeth and Oropharynx as per pre-operative assessment

## 2015-03-01 NOTE — H&P (Signed)
Reason For Visit   Cystoscopy & to review urodynamics   Active Problems Problems  1. Acute urinary retention (R33.8) 2. Bladder pain (R39.89) 3. 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 Muscle weakness (M62.81) 5. Muscular disorder of urethra (N36.44) 6. Other lack of coordination (R27.8) 7. Postinfective urethral stricture in female (N35.12) 8. Urinary retention (R33.9) 9. Vesicoureteral-reflux (N13.70)  History of Present Illness   Mary [name] is an 81 yo female, who returns today for a cystoscopy & to review urodynamic results.  She has a history of a hypotonic bladder. and was seen recently with urinary retention, with a postvoid residual of 950 cc. She had difficult urinary catheterization.   The patient reports a history of voiding normally during the daytime, but only problems at nighttime. She has been voiding with a double void technique, as well as physical therapy. She has multiple bladder diverticula, which are wide mouth. These empty well. She has been given Flomax in the past while living in [place], but this did not help her empty her bladder. She does have a small amount of urinary incontinence when wiping with tissue after voiding. Physical therapy has helped her retain urine.    Video urodynamics was accomplished on 01/19/15, in the sitting position, and shows a first sensation of filling at 333 cc, normal desire and strong desire at 346 cc. She developed an unstable bladder contraction at that time, with a contraction pressure of 97cm H20 pressure. She voids 131 cc. Filling was then continued.    The patient did not leak for Valsalva leak point pressure, with an abdominal pressure generation of 78-98 cm of water pressure.    Pressure flow study shows a voided volume of 32 cc, with a maximum flow rate of 4 cc/s, and a detrusor pressure at maximum flow of 71 cm of water. PVR is 225 cc.    Fluoroscopy was accomplished, and shows that the bladder neck descends  approximately 1 cm. The bladder is trabeculated as noted on VCUG and fluoroscopy, with multiple bladder diverticula. There is right-sided reflux, mild, international grade 1-2.    This patient has a maximum bladder capacity of 350 cc. She has an unstable bladder contraction, at maximum capacity, and voids off this contraction. She is able to generate a voluntary contraction, however, with high detrusor pressure (73 cm of water), with obstructive flow pattern. Her postvoid residual is 225 cc. EMG activity is increased during both voids. VCUG shows both trabeculation and bladder diverticula. There is right-sided reflux. Following urodynamics, a 59 French Foley was inserted. It was placed to straight drainage.     Past Medical History Problems  1. History of Breast Cancer 2. History of hypercholesterolemia (Z86.39)  Surgical History Problems  1. History of Breast Surgery Mastectomy 2. History of Knee Surgery Right  Current Meds 1. Aspirin 81 MG TABS;  Therapy: (Recorded:09Dec2014) to Recorded 2. Atorvastatin Calcium 40 MG Oral Tablet;  Therapy: (Recorded:27Feb2013) to Recorded 3. Bethanechol Chloride 25 MG Oral Tablet; ONE TABLET TID;  Therapy: [phone] to (Evaluate:23Nov2016)  Requested for: 21Oct2016; Last  Rx:21Oct2016 Ordered 4. Calan SR 240 MG Oral Tablet Extended Release; TAKE 0.5 TABLET Daily;  Therapy: (Recorded:15Jul2009) to Recorded 5. Caltrate 600 TABS;  Therapy: (Recorded:27Feb2013) to Recorded 6. Centrum TABS;  Therapy: (Recorded:15Jul2009) to Recorded 7. Ciprofloxacin HCl - 250 MG Oral Tablet; Take 1 tablet daily;  Therapy: 26Oct2016 to (Evaluate:27Oct2016); Last Rx:26Oct2016 Ordered 8. Lanoxin 125 MCG Oral Tablet;  Therapy: (Recorded:15Jul2009) to Recorded 9. Methocarbamol 500 MG Oral Tablet; is taking  prn muscle spasms;  Therapy: (Recorded:09Dec2014) to Recorded 10. Tylenol CAPS; prn knee pain;   Therapy: (Recorded:09Dec2014) to Recorded  Allergies Medication   1. Bactrim TABS 2. Biaxin TABS 3. Macrobid CAPS 4. Penicillins 5. Sulfa Drugs  Family History Problems  1. Family history of Death In The Family Father   Deceased at age 67 2. Family history of Death In The Family Mother   Deceased at age 54 3. Family history of Family Health Status Number Of Children   1 Son & 2 Daughters 4. No pertinent family history : Mother  Social History Problems  1. Activities Of Daily Living 2. Activities of daily living (ADL's), independent 3. Denied: History of Alcohol Use (History) 4. Caffeine Use   1-2 5. Exercises regularly   no reg exer but is active at home 6. Living Independently 7. Living Situation: Supportive and safe 8. Marital History - Widowed 9. Never A Smoker 10. Retired From Work 44. Self-reliant In Usual Daily Activities  Review of Systems Genitourinary, constitutional, skin, eye, otolaryngeal, hematologic/lymphatic, cardiovascular, pulmonary, endocrine, musculoskeletal, gastrointestinal, neurological and psychiatric system(s) were reviewed and pertinent findings if present are noted and are otherwise negative.  Genitourinary: urinary frequency, urinary urgency, dysuria, nocturia, incontinence and incomplete emptying of bladder, but no urinary hesitancy, urinary stream does not start and stop and initiating urination does not require straining.  Gastrointestinal: constipation, but no nausea, no vomiting, no heartburn and no diarrhea.  Constitutional: no fever, no night sweats, not feeling tired (fatigue) and no recent weight loss.  Integumentary: no new skin rashes or lesions and no pruritus.  Eyes: no blurred vision and no diplopia.  ENT: no sore throat and no sinus problems.  Hematologic/Lymphatic: no tendency to easily bruise and no swollen glands.  Cardiovascular: no chest pain and no leg swelling.  Respiratory: no shortness of breath and no cough.  Endocrine: no polydipsia.  Musculoskeletal: no back pain and no joint  pain.  Neurological: difficulty walking, but no dizziness and no headache.  Psychiatric: no depression and no anxiety.    Vitals 05 February 2015  Blood Pressure: 200 / 100  Tmperature: 98.5 F  Heart Rate: 91   Physical Exam Constitutional: Well nourished and well developed . No acute distress.   ENT:. The ears and nose are normal in appearance.   Neck: The appearance of the neck is normal and no neck mass is present.   Pulmonary: No respiratory distress.   Cardiovascular: Heart rate and rhythm are normal . No peripheral edema.   Abdomen: The abdomen is soft and nontender. No masses are palpated. No CVA tenderness. No hernias are palpable. No hepatosplenomegaly noted.   Genitourinary:  Chaperone Present: kim lewis. The urethra is normal in appearance, not tender, does not appear stenotic and no urethral caruncle. There is no urethral mass. Urethral hypermobility is not present. No periurethral cyst is identified. There is no urethral prolapse. Vaginal exam demonstrates atrophy, tenderness and the vaginal epithelium to be poorly estrogenized, but no discharge. No cystocele is identified. No enterocele is identified. No rectocele is identified. The adnexa are palpably normal. The bladder is normal on palpation, non tender and not distended. The anus is normal on inspection. The perineum is normal on inspection.   Lymphatics: The femoral and inguinal nodes are not enlarged or tender.   Skin: Normal skin turgor, no visible rash, no visible skin lesions and normal skin color and pigmentation.   Neuro/Psych:. Mood and affect are appropriate.    Results/Data  PVR:  Ultrasound PVR 119 ml.    Procedure  Procedure: Cystoscopy  Chaperone Present: kim lewis.  Indication: Lower Urinary Tract Symptoms.  Informed Consent: Risks, benefits, and potential adverse events were discussed and informed consent was obtained from the patient.  Prep: The patient was prepped with betadine.   Anesthesia:. Local anesthesia was administered intraurethrally with 2% lidocaine jelly.  Antibiotic prophylaxis: Ciprofloxacin.  Procedure Note:  Urethral meatus:. A pinpoint and moderate stricture was present at the urethral meatus and was dilated.  Anterior urethra: No abnormalities.  Prostatic urethra: No abnormalities.  Bladder: Visulization was obscured due to cloudy urine. The ureteral orifices were in the normal anatomic position bilaterally. Examination of the bladder demonstrated trabeculation edema located on the posterior aspect, toward the midline, at the base of the bladder measuring approximately 3 cm , but no cellules. The patient tolerated the procedure well.  Complications: None.    Assessment Assessed  1. 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 Acute urinary retention (R33.8) 3. Vesicoureteral-reflux (N13.70) 4. Postinfective urethral stricture in female (N35.12)    81 yo female with urethral stricture stricture- dilated, and with urodynamics showing Right reflux ( grade 1); diverticula, obstructed flow ( 4cc/sec peak flow), and cystoscopy showing edema of posterior base ( vs TCC or CIS) needing biopsy. Non-smoker..    I think the area in question may represent edema, but CIS and TCC must be ruled out. She needs outpatient cysto,and bladder biopsy. She will have urethral dilation at the same time. She is going to [place] over Thanksgiving, and will be scheduled after the Holiday.   Plan Hypotonic bladder  1. PVR U/S; Status:Complete;   Done: [id number]    1. Uribel and Cipro today  2. Schedule: Cysto and urethral dilation and bladder biopsies.   Signatures Electronically signed by : [name], M.D.; Feb 19 2015  9:42PM EST

## 2015-03-01 NOTE — Op Note (Signed)
Pre-operative diagnosis :   1. Right lateral bladder wall bladder tumor, 1cm                                             2. Chronic urinary retrntion  Postoperative diagnosis:  same Operation:  Cystourethroscopy, Tauber excisional biopsy of 1 cm right lateral bladder wall bladder tumor, urethral dilation (28 [place]) Foley catheterization  Surgeon:  S. [name], MD  First assistant:  none  Anesthesia:  General LMA  Preparation:  After appropriate anesthesia, the patient was brought to the operating, placed on the operating table in dorsal supine position where general LMA anesthesia was introduced. She was replaced in dorsal lithotomy position with pubis was prepped with Betadine solution and draped in usual fashion. The history was reviewed.  Review history:  & to review urodynamics   Active Problems Problems  1. Acute urinary retention (R33.8) 2. Bladder pain (R39.89) 3. 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 Muscle weakness (M62.81) 5. Muscular disorder of urethra (N36.44) 6. Other lack of coordination (R27.8) 7. Postinfective urethral stricture in female (N35.12) 8. Urinary retention (R33.9) 9. Vesicoureteral-reflux (N13.70)  History of Present Illness   [name] is an 80 yo female, who returns today for a cystoscopy & to review urodynamic results.  She has a history of a hypotonic bladder. and was seen recently with urinary retention, with a postvoid residual of 950 cc. She had difficult urinary catheterization.   The patient reports a history of voiding normally during the daytime, but only problems at nighttime. She has been voiding with a double void technique, as well as physical therapy. She has multiple bladder diverticula, which are wide mouth. These empty well. She has been given Flomax in the past while living in [place], but this did not help her empty her bladder. She does have a small amount of urinary incontinence when wiping with tissue after voiding. Physical  therapy has helped her retain urine.    Video urodynamics was accomplished on 01/19/15, in the sitting position, and shows a first sensation of filling at 333 cc, normal desire and strong desire at 346 cc. She developed an unstable bladder contraction at that time, with a contraction pressure of 97cm H20 pressure. She voids 131 cc. Filling was then continued.    The patient did not leak for Valsalva leak point pressure, with an abdominal pressure generation of 78-98 cm of water pressure.    Pressure flow study shows a voided volume of 32 cc, with a maximum flow rate of 4 cc/s, and a detrusor pressure at maximum flow of 71 cm of water. PVR is 225 cc.    Fluoroscopy was accomplished, and shows that the bladder neck descends approximately 1 cm. The bladder is trabeculated as noted on VCUG and fluoroscopy, with multiple bladder diverticula. There is right-sided reflux, mild, international grade 1-2.    This patient has a maximum bladder capacity of 350 cc. She has an unstable bladder contraction, at maximum capacity, and voids off this contraction. She is able to generate a voluntary contraction, however, with high detrusor pressure (73 cm of water), with obstructive flow pattern. Her postvoid residual is 225 cc. EMG activity is increased during both voids. VCUG shows both trabeculation and bladder diverticula. There is right-sided reflux. Following urodynamics, a 68 French Foley was inserted. It was placed to straight drainage.     Statement of  Likelihood of Success: Excellent. TIME-OUT observed.:  Procedure: The urethra was dilated to size 20 [place]. The cystoscope was placed in the bladder, right lateral bladder and was notified. Using the [facility] biopsy forceps, the tumor was excised. A 16 French Foley catheter was then placed in the bladder. It is noted that the bladder had extensive trabeculation inside will formation as well as diverticular formation. The patient had some bleeding in  the procedure, felt from the urethral dilation.

## 2015-03-02 ENCOUNTER — Encounter (HOSPITAL_BASED_OUTPATIENT_CLINIC_OR_DEPARTMENT_OTHER): Payer: Self-pay | Admitting: Urology

## 2015-03-23 ENCOUNTER — Other Ambulatory Visit: Payer: Self-pay | Admitting: Otolaryngology

## 2015-03-23 ENCOUNTER — Ambulatory Visit
Admission: RE | Admit: 2015-03-23 | Discharge: 2015-03-23 | Disposition: A | Discharge status: Home / Self Care | Payer: Medicare Other | Source: Ambulatory Visit | Attending: Otolaryngology | Admitting: Otolaryngology

## 2015-03-23 DIAGNOSIS — J209 Acute bronchitis, unspecified: Secondary | ICD-10-CM | POA: Diagnosis not present

## 2015-03-23 DIAGNOSIS — R05 Cough: Secondary | ICD-10-CM | POA: Diagnosis not present

## 2015-03-31 DIAGNOSIS — R338 Other retention of urine: Secondary | ICD-10-CM | POA: Diagnosis not present

## 2015-03-31 DIAGNOSIS — N312 Flaccid neuropathic bladder, not elsewhere classified: Secondary | ICD-10-CM | POA: Diagnosis not present

## 2015-03-31 DIAGNOSIS — N3512 Postinfective urethral stricture, not elsewhere classified, female: Secondary | ICD-10-CM | POA: Diagnosis not present

## 2015-04-04 ENCOUNTER — Emergency Department (HOSPITAL_COMMUNITY)
Admission: EM | Admit: 2015-04-04 | Discharge: 2015-04-04 | Disposition: A | Discharge status: Home / Self Care | Payer: Medicare Other | Attending: Emergency Medicine | Admitting: Emergency Medicine

## 2015-04-04 ENCOUNTER — Encounter (HOSPITAL_COMMUNITY): Payer: Self-pay | Admitting: Emergency Medicine

## 2015-04-04 DIAGNOSIS — Z8739 Personal history of other diseases of the musculoskeletal system and connective tissue: Secondary | ICD-10-CM | POA: Diagnosis not present

## 2015-04-04 DIAGNOSIS — M810 Age-related osteoporosis without current pathological fracture: Secondary | ICD-10-CM | POA: Insufficient documentation

## 2015-04-04 DIAGNOSIS — Z86018 Personal history of other benign neoplasm: Secondary | ICD-10-CM | POA: Diagnosis not present

## 2015-04-04 DIAGNOSIS — Z87448 Personal history of other diseases of urinary system: Secondary | ICD-10-CM | POA: Insufficient documentation

## 2015-04-04 DIAGNOSIS — R109 Unspecified abdominal pain: Secondary | ICD-10-CM | POA: Insufficient documentation

## 2015-04-04 DIAGNOSIS — E782 Mixed hyperlipidemia: Secondary | ICD-10-CM | POA: Insufficient documentation

## 2015-04-04 DIAGNOSIS — Z880 Allergy status to penicillin: Secondary | ICD-10-CM | POA: Insufficient documentation

## 2015-04-04 DIAGNOSIS — Z85828 Personal history of other malignant neoplasm of skin: Secondary | ICD-10-CM | POA: Insufficient documentation

## 2015-04-04 DIAGNOSIS — R339 Retention of urine, unspecified: Secondary | ICD-10-CM | POA: Diagnosis not present

## 2015-04-04 DIAGNOSIS — Z79899 Other long term (current) drug therapy: Secondary | ICD-10-CM | POA: Diagnosis not present

## 2015-04-04 DIAGNOSIS — Z853 Personal history of malignant neoplasm of breast: Secondary | ICD-10-CM | POA: Diagnosis not present

## 2015-04-04 DIAGNOSIS — Z86010 Personal history of colonic polyps: Secondary | ICD-10-CM | POA: Diagnosis not present

## 2015-04-04 DIAGNOSIS — Z8709 Personal history of other diseases of the respiratory system: Secondary | ICD-10-CM | POA: Diagnosis not present

## 2015-04-04 LAB — COMPREHENSIVE METABOLIC PANEL
ALBUMIN: 4.6 g/dL (ref 3.5–5.0)
ALK PHOS: 88 U/L (ref 38–126)
ALT: 22 U/L (ref 14–54)
ANION GAP: 13 (ref 5–15)
AST: 21 U/L (ref 15–41)
BILIRUBIN TOTAL: 0.7 mg/dL (ref 0.3–1.2)
BUN: 12 mg/dL (ref 6–20)
CALCIUM: 10 mg/dL (ref 8.9–10.3)
CO2: 26 mmol/L (ref 22–32)
Chloride: 106 mmol/L (ref 101–111)
Creatinine, Ser: 0.77 mg/dL (ref 0.44–1.00)
GFR calc Af Amer: >60 mL/min (ref >60)
GFR calc non Af Amer: >60 mL/min (ref >60)
Glucose, Bld: 128 mg/dL — ABNORMAL HIGH (ref 65–99)
Potassium: 4.3 mmol/L (ref 3.5–5.1)
SODIUM: 145 mmol/L (ref 135–145)
TOTAL PROTEIN: 7.7 g/dL (ref 6.5–8.1)

## 2015-04-04 LAB — URINE MICROSCOPIC-ADD ON
Bacteria, UA: NONE SEEN
Squamous Epithelial / LPF: NONE SEEN

## 2015-04-04 LAB — URINALYSIS, ROUTINE W REFLEX MICROSCOPIC
Bilirubin Urine: NEGATIVE
Glucose, UA: NEGATIVE mg/dL
Ketones, ur: NEGATIVE mg/dL
LEUKOCYTES UA: NEGATIVE
Nitrite: NEGATIVE
PROTEIN: NEGATIVE mg/dL
Specific Gravity, Urine: 1.008 (ref 1.005–1.030)
pH: 7 (ref 5.0–8.0)

## 2015-04-04 NOTE — Discharge Instructions (Signed)
Acute Urinary Retention, Female °Acute urinary retention is the temporary inability to urinate. This is an uncommon problem in women. It can be caused by: °· Infection. °· A side effect of a medicine. °· A problem in a nearby organ that presses or squeezes on the bladder or the urethra (the tube that drains the bladder). °· Psychological problems. °·  Surgery on your bladder, urethra, or pelvic organs that causes obstruction to the outflow of urine from your bladder. °HOME CARE INSTRUCTIONS  °If you are sent home with a Foley catheter and a drainage system, you will need to discuss the best course of action with your health care provider. While the catheter is in, maintain a good intake of fluids. Keep the drainage bag emptied and lower than your catheter. This is so that contaminated urine will not flow back into your bladder, which could lead to a urinary tract infection. °There are two main types of drainage bags. One is a large bag that usually is used at night. It has a good capacity that will allow you to sleep through the night without having to empty it. The second type is called a leg bag. It has a smaller capacity so it needs to be emptied more frequently. However, the main advantage is that it can be attached by a leg strap and goes underneath your clothing, allowing you the freedom to move about or leave your home. °Only take over-the-counter or prescription medicines for pain, discomfort, or fever as directed by your health care provider.  °SEEK MEDICAL CARE IF: °· You develop a low-grade fever. °· You experience spasms or leakage of urine with the spasms. °SEEK IMMEDIATE MEDICAL CARE IF:  °· You develop chills or fever. °· Your catheter stops draining urine. °· Your catheter falls out. °· You start to develop increased bleeding that does not respond to rest and increased fluid intake. °MAKE SURE YOU: °· Understand these instructions. °· Will watch your condition. °· Will get help right away if you are  not doing well or get worse. °  °This information is not intended to replace advice given to you by your health care provider. Make sure you discuss any questions you have with your health care provider. °  °Document Released: 03/05/2006 Document Revised: 07/21/2014 Document Reviewed: 08/15/2012 °Elsevier Interactive Patient Education ©2016 Elsevier Inc. ° °

## 2015-04-04 NOTE — ED Notes (Signed)
Pt. Discharged with indwelling foley cath per MD , attached to leg bag, indwelling well.

## 2015-04-04 NOTE — ED Notes (Signed)
Patient states she is having urinary retention. Patient had a catheter in for 30 days. She just got it pulled a few days ago. Patient on prescription for ciprofloxacin. Patient is complaining of lower abdominal pain.

## 2015-04-05 NOTE — ED Provider Notes (Signed)
CSN: [id number]     Arrival date & time 04/04/15  0254 History   First MD Initiated Contact with Patient 04/04/15 0356     Chief Complaint  Patient presents with  . Urinary Retention     (Consider location/radiation/quality/duration/timing/severity/associated sxs/prior Treatment) Patient is a 82 y.o. female presenting with female genitourinary complaint. The history is provided by the patient.  Female GU Problem This is a recurrent problem. The current episode started 12 to 24 hours ago. The problem occurs constantly. The problem has not changed since onset.Associated symptoms include abdominal pain. Nothing aggravates the symptoms. Nothing relieves the symptoms. She has tried nothing for the symptoms.    Past Medical History  Diagnosis Date  . Heart palpitations no cardiologist--  monitored by pcp    per pt "has been on verapamil and lanoxin for years and no palpitations for a very long time"  . Mixed hyperlipidemia   . External hemorrhoid   . Osteoporosis     DEXA 01/2009; T-3.2 L fem neck, -2.4 L radius  . Ashkenazi Jewish ancestry   . History of adenomatous polyp of colon     2003  . History of benign colon tumor     2007  --  HIGH GRADE HYPERPLASTIC ,  S/P  LEFT HEMICOLECTOMY  . Hypotonic bladder UROLOGIST-  DR [name]  . History of breast cancer ONCOLOGIST-  DR Truddie Coco--  antiestogen therapy with femera completed 12/  2012--  no recurrence    dx 10/  2007  right breast DCIS, grade 2, Stage 2A, pT2  pN0 pMX,  (ER 100% /PR 3% +),  HER +3) with 1 metastatic axill node---  s/p  right mastectomy   . History of acute bronchitis     dx 12-21-2014  finished ZPAK  . Urinary retention     02-13-2015  . OA (osteoarthritis)     hip  . History of basal cell carcinoma excision     2006  left nasolabial fold  . Foley catheter in place    Past Surgical History  Procedure Laterality Date  . Appendectomy  age 69  . Tonsillectomy  age 66  . Colonoscopy  last one 2013  . Partial knee  arthroplasty Right 01/24/2013    Procedure: RIGHT KNEE MEDIAL UNICOMPARTMENTAL ARTHROPLASTY;  Surgeon: Gearlean Alf, MD;  Location: [facility];  Service: Orthopedics;  Laterality: Right;  . Laparoscopic assisted left hemicolectomy  06/  2007    high grade hyperplastic mass  . Cataract extraction w/ intraocular lens  implant, bilateral  left 1991  &  right 1993  . Pulley release left index and small finger  08-07-2007  . Carpal tunnel release Right 04-24-2007    and Pulley Release index and small finger  . Breast lumpectomy with needle localization and axillary sentinel lymph node bx Right 01-23-2006  . Mastectomy Right 02-28-2006  . Cystoscopy with biopsy N/A 03/01/2015    Procedure: CYSTOSCOPY WITH TAUBER BIOPSY OF 1CM RIGHT LATERAL BLADDER WALL AND CAUTERIZATION OF BIOPSY SITE ;  Surgeon: [name], MD;  Location: [facility];  Service: Urology;  Laterality: [id number] HOME [phone]  [facility] [id number] D AARP [id number]   Family History  Problem Relation Age of Onset  . Hypertension Mother   . Stroke Father   . Diabetes Father   . Hyperlipidemia Brother   . Breast cancer Paternal Aunt     dx 51s; deceased early 51s  . Breast cancer Cousin     paternal first  cousin  . Colon cancer Cousin     paternal first cousin; age dx 54s  . Hyperlipidemia Son    Social History  Substance Use Topics  . Smoking status: Never Smoker   . Smokeless tobacco: Never Used  . Alcohol Use: Yes     Comment: Very seldom   OB History    Gravida Para Term Preterm AB TAB SAB Ectopic Multiple Living   3 3        3      Review of Systems  Gastrointestinal: Positive for abdominal pain.  All other systems reviewed and are negative.     Allergies  Arimidex; Avelox; Bactrim; Biaxin; Macrobid; Penicillins; and Sulfa antibiotics  Home Medications   Prior to Admission medications   Medication Sig Start Date End Date Taking? Authorizing Provider  atorvastatin (LIPITOR)  40 MG tablet TAKE 1 TABLET BY MOUTH EVERY EVENING 02/24/15   Rita Ohara, MD  bethanechol (URECHOLINE) 25 MG tablet Take 25 mg by mouth 3 (three) times daily.     Historical Provider, MD  Calcium Carbonate-Vitamin D (CALCIUM 600+D) 600-400 MG-UNIT per tablet Take 1 tablet by mouth 2 (two) times daily.     Historical Provider, MD  Cholecalciferol (VITAMIN D3) 2000 UNITS TABS Take 2,000 Units by mouth daily.    Historical Provider, MD  denosumab (PROLIA) 60 MG/ML SOLN injection Inject 60 mg into the skin See admin instructions. Administer in upper arm, thigh, or abdomen 10/24/12   Historical Provider, MD  LANOXIN 125 MCG tablet Take 1 tablet (125 mcg total) by mouth daily. Patient taking differently: Take 125 mcg by mouth every morning.  04/15/14   Rita Ohara, MD  loratadine (CLARITIN) 10 MG tablet Take 10 mg by mouth daily as needed for allergies.     Historical Provider, MD  Multiple Vitamins-Minerals (CENTRUM SILVER ADULT 50+) TABS Take 1 tablet by mouth daily.    Historical Provider, MD  Multiple Vitamins-Minerals (ICAPS AREDS 2) CAPS Take 1 capsule by mouth 2 (two) times daily.    Historical Provider, MD  phenazopyridine (PYRIDIUM) 200 MG tablet Take 1 tablet (200 mg total) by mouth 3 (three) times daily as needed for pain. 03/01/15   [name], MD  polyethylene glycol (MIRALAX / Floria Raveling) packet Take 17 g by mouth daily as needed for moderate constipation.     Historical Provider, MD  traMADol-acetaminophen (ULTRACET) 37.5-325 MG tablet Take 1 tablet by mouth every 6 (six) hours as needed. 03/01/15   [name], MD  VALIUM 2 MG tablet Take 1 tablet (2 mg total) by mouth every 8 (eight) hours as needed for anxiety or muscle spasms. 02/15/15   Rita Ohara, MD  verapamil (CALAN-SR) 240 MG CR tablet Take 1 tablet (240 mg total) by mouth daily. Patient taking differently: Take 240 mg by mouth every morning.  10/05/14   Rita Ohara, MD   BP 152/80 mmHg  Pulse 80  Temp(Src) 98.2 F (36.8 C)  (Oral)  Resp 18  Ht 5\' 3"  (1.6 m)  Wt 125 lb (56.7 kg)  BMI 22.15 kg/m2  SpO2 94% Physical Exam  Constitutional: She is oriented to person, place, and time. She appears well-developed and well-nourished. No distress.  HENT:  Head: Normocephalic.  Eyes: Conjunctivae are normal.  Neck: Neck supple. No tracheal deviation present.  Cardiovascular: Normal rate and regular rhythm.   Pulmonary/Chest: Effort normal. No respiratory distress.  Abdominal: Soft. She exhibits no distension. There is tenderness (mild suprapubic).  Neurological: She is alert and oriented to  person, place, and time.  Skin: Skin is warm and dry.  Psychiatric: She has a normal mood and affect.    ED Course  Procedures (including critical care time) Labs Review Labs Reviewed  URINALYSIS, ROUTINE W REFLEX MICROSCOPIC (NOT AT [facility]) - Abnormal; Notable for the following:    Hgb urine dipstick TRACE (*)    All other components within normal limits  COMPREHENSIVE METABOLIC PANEL - Abnormal; Notable for the following:    Glucose, Bld 128 (*)    All other components within normal limits  URINE MICROSCOPIC-ADD ON    Imaging Review No results found. I have personally reviewed and evaluated these images and lab results as part of my medical decision-making.   EKG Interpretation None      MDM   Final diagnoses:  Urinary retention    82 y.o. female presents with recurrent urinary retention. Followed by urology for same, currently on cipro. Recently had indwelling catheter removed. Foley insertion here yields >1L urine suggesting need for maintaining foley to prevent recurrent obstruction until able to follow up with urology. No evidence of infection or post-renal azotemia currently. Plan to follow up with PCP as needed and return precautions discussed for worsening or new concerning symptoms.     Leo Grosser, MD 04/05/15 [phone]

## 2015-04-11 ENCOUNTER — Other Ambulatory Visit: Payer: Self-pay | Admitting: Family Medicine

## 2015-04-13 DIAGNOSIS — R338 Other retention of urine: Secondary | ICD-10-CM | POA: Diagnosis not present

## 2015-04-13 DIAGNOSIS — N312 Flaccid neuropathic bladder, not elsewhere classified: Secondary | ICD-10-CM | POA: Diagnosis not present

## 2015-04-21 ENCOUNTER — Encounter: Payer: Self-pay | Admitting: Family Medicine

## 2015-04-26 ENCOUNTER — Encounter: Payer: Self-pay | Admitting: Family Medicine

## 2015-04-26 ENCOUNTER — Ambulatory Visit (INDEPENDENT_AMBULATORY_CARE_PROVIDER_SITE_OTHER): Payer: Medicare Other | Admitting: Family Medicine

## 2015-04-26 VITALS — BP 130/80 | HR 80 | Ht 62.5 in | Wt 122.0 lb

## 2015-04-26 DIAGNOSIS — N312 Flaccid neuropathic bladder, not elsewhere classified: Secondary | ICD-10-CM

## 2015-04-26 DIAGNOSIS — R002 Palpitations: Secondary | ICD-10-CM

## 2015-04-26 DIAGNOSIS — E782 Mixed hyperlipidemia: Secondary | ICD-10-CM

## 2015-04-26 DIAGNOSIS — I10 Essential (primary) hypertension: Secondary | ICD-10-CM

## 2015-04-26 DIAGNOSIS — Z5181 Encounter for therapeutic drug level monitoring: Secondary | ICD-10-CM | POA: Diagnosis not present

## 2015-04-26 LAB — HEPATIC FUNCTION PANEL
ALBUMIN: 4 g/dL (ref 3.6–5.1)
ALK PHOS: 69 U/L (ref 33–130)
ALT: 14 U/L (ref 6–29)
AST: 15 U/L (ref 10–35)
Bilirubin, Direct: 0.1 mg/dL (ref <=0.2)
Indirect Bilirubin: 0.4 mg/dL (ref 0.2–1.2)
Total Bilirubin: 0.5 mg/dL (ref 0.2–1.2)
Total Protein: 6.9 g/dL (ref 6.1–8.1)

## 2015-04-26 LAB — LIPID PANEL
CHOL/HDL RATIO: 3.5 ratio (ref <=5.0)
CHOLESTEROL: 204 mg/dL — AB (ref 125–200)
HDL: 59 mg/dL (ref >=46)
LDL Cholesterol: 95 mg/dL (ref <130)
TRIGLYCERIDES: 250 mg/dL — AB (ref <150)
VLDL: 50 mg/dL — AB (ref <30)

## 2015-04-26 LAB — TSH: TSH: 2.12 mIU/L

## 2015-04-26 NOTE — Progress Notes (Signed)
Chief Complaint  Patient presents with  . Hypertension    fasting med check. Still has catheter in-was taken on by Dr.Tannenbaum and then put back in 03/31/15 at the ER.    Hypertension and palpitation follow-up: Blood pressures are not checked elsewhere. Denies dizziness, headaches, chest pain. Denies side effects of medications. Palpitations are well controlled. She had been switched to generic digoxin in July 2016 when branded Lanoxin wasn't available.  It is now available again and she is back on the brand.  She has been doing well with the generic verapamil.  Hyperlipidemia follow-up: Patient is reportedly following a low cholesterol diet. Compliant with medications and denies medication side effects.She is no longer taking fish oil, as she had a hard time swallowing it, and seems similar to her AREDs.  She continues to have 2 [place] peppermint patties each day.  She has had ongoing problems with urinary retention.  She had cystoscopy and bladder biopsy, with urethral dilation 03/01/15. She kept the catheter in afterwards in order to travel to [place] with her daughter.  1/11 she had it removed, developed retention, had to go to ER a few days later and has had catheter back in since then.  She had been given valium 31m BID to help initiate void (to help with urinary retention), only tried it once and it didn't prevent the ER visit. She has follow up with urologist next week.  Dr. GDelman Cheadlehad been treating her for alopecia with injections and topical steroids. She hasn't had any treatments since before Thanksgiving (when Dr. GDelman Cheadlehad her baby). Not improving much, but not getting worse.  She was started on Prolia injections by Dr. MJana Hakimin July 2014--she is getting just once a year. She is taking calcium and vitamin D. Not getting any weight-bearing exercise. DEXA last done 01/2015, showing osteoporosis.  She had a bad cough develop in early January [place] She saw Dr.  RConstance Holster(ENT) and was treated with a z-pak.  It was different than her usual allergies. She takes claritin daily, switched to Zyrtec for a few days and found it very helpful. She had decreased appetite after the illness, so started taking some Ensure.  She lost a few pounds during that time.  Appetite is improved now, no further weight loss, and no longer taking Ensure.  PMH, PSH, SH reviewed and updated.  Outpatient Encounter Prescriptions as of 04/26/2015  Medication Sig Note  . atorvastatin (LIPITOR) 40 MG tablet TAKE 1 TABLET BY MOUTH EVERY EVENING   . Calcium Carbonate-Vitamin D (CALCIUM 600+D) 600-400 MG-UNIT per tablet Take 1 tablet by mouth 2 (two) times daily.    . Cholecalciferol (VITAMIN D3) 2000 UNITS TABS Take 2,000 Units by mouth daily.   .Marland Kitchendenosumab (PROLIA) 60 MG/ML SOLN injection Inject 60 mg into the skin See admin instructions. Administer in upper arm, thigh, or abdomen 07/09/2014: yearly  . LANOXIN 125 MCG tablet TAKE 1 TABLET BY MOUTH EVERY DAY.   .Marland Kitchenloratadine (CLARITIN) 10 MG tablet Take 10 mg by mouth daily as needed for allergies.    . Multiple Vitamins-Minerals (CENTRUM ADULTS PO) Take 1 tablet by mouth daily.   . Multiple Vitamins-Minerals (ICAPS AREDS 2) CAPS Take 1 capsule by mouth 2 (two) times daily.   . polyethylene glycol (MIRALAX / GLYCOLAX) packet Take 17 g by mouth daily as needed for moderate constipation.  02/23/2015: .  . verapamil (CALAN-SR) 240 MG CR tablet TAKE 1 TABLET(240 MG) BY MOUTH DAILY   . [  DISCONTINUED] Multiple Vitamins-Minerals (CENTRUM SILVER ADULT 50+) TABS Take 1 tablet by mouth daily.   . [DISCONTINUED] traMADol-acetaminophen (ULTRACET) 37.5-325 MG tablet Take 1 tablet by mouth every 6 (six) hours as needed.   Marland Kitchen VALIUM 2 MG tablet Take 1 tablet (2 mg total) by mouth every 8 (eight) hours as needed for anxiety or muscle spasms. (Patient not taking: Reported on 04/26/2015)   . [DISCONTINUED] bethanechol (URECHOLINE) 25 MG tablet Take 25 mg by mouth 3  (three) times daily.  02/23/2015: ON HOLD UNTIL AFTER PROCEDURE 03-01-2015  . [DISCONTINUED] phenazopyridine (PYRIDIUM) 200 MG tablet Take 1 tablet (200 mg total) by mouth 3 (three) times daily as needed for pain.    No facility-administered encounter medications on file as of 04/26/2015.   Allergies  Allergen Reactions  . Arimidex [Anastrozole] Rash  . Avelox [Moxifloxacin Hcl In Nacl] Nausea Only and Rash  . Bactrim Nausea Only and Rash  . Biaxin [Clarithromycin] Nausea Only and Rash  . Macrobid [Nitrofurantoin] Nausea Only and Rash  . Penicillins Nausea Only and Rash    Has patient had a PCN reaction causing immediate rash, facial/tongue/throat swelling, SOB or lightheadedness with hypotension: no Has patient had a PCN reaction causing severe rash involving mucus membranes or skin necrosis: no Has patient had a PCN reaction that required hospitalization no Has patient had a PCN reaction occurring within the last 10 years no If all of the above answers are "NO", then may proceed with Cephalosporin use.   . Sulfa Antibiotics Nausea And Vomiting and Rash   ROS:  No fever, chills.  URI and cough resolved (just slight allergies, occasional tickle/cough).  Denies chest pain, palpitations, shortness of breath, nausea, vomiting, abdominal pain.  +constipation.  No bowel changes, hematochezia, melena.  She has indwelling catheter without any urinary complaints. Denies depression, anxiety. Denies bleeding, bruising, rash.  Some R knee stiffness, slight pain on the left  PHYSICAL EXAM: BP 130/80 mmHg  Pulse 80  Ht 5' 2.5" (1.588 m)  Wt 122 lb (55.339 kg)  BMI 21.94 kg/m2  Well developed, pleasant female in no distress HEENT: PERRL, EOMI, conjunctiva clear, OP clear Neck: no lymphadenopathy, thyromegaly or mass Heart: regular rate and rhythm without murmur, rub, gallop ro ectopy Lungs: clear bilaterally Abdomen: soft, nontender, nondistended, no mass Extremities: no edema, normal  pulses Neuro: alert and oriented, cranial nerves, strength and gait normal Psych: normal mood, affect, hygiene and grooming  ASSESSMENT/PLAN:  Mixed hyperlipidemia - Due for check today.   Palpitations - well controlled with verapamil and digoxin - Plan: TSH, Digoxin level  Atony of bladder - currently with indwelling catheter. f/u with urologist as scheduled  Essential hypertension, benign - Plan: Lipid panel  Medication monitoring encounter - Plan: Lipid panel, Hepatic function panel, Digoxin level   TSH, lipid, Digoxin level, LFT's (c-met and CBC done in July).  Discussed that computer is saying that digoxin level isn't covered.  Done last year with same diagnoses, and done once yearly. She doesn't recall getting any significant bill last year. She will let us know if she gets a large bill for [place] to look into and resubmit (?with another code?)  Don't send results with MyChart (letter or phone call)  F/u 6 mos--AWV/med check

## 2015-04-26 NOTE — Patient Instructions (Signed)
Continue your current medications. We will be in touch with your lab results in a few days.

## 2015-04-27 LAB — DIGOXIN LEVEL: DIGOXIN LVL: 0.8 ug/L (ref 0.8–2.0)

## 2015-05-03 DIAGNOSIS — N312 Flaccid neuropathic bladder, not elsewhere classified: Secondary | ICD-10-CM | POA: Diagnosis not present

## 2015-05-05 DIAGNOSIS — N312 Flaccid neuropathic bladder, not elsewhere classified: Secondary | ICD-10-CM | POA: Diagnosis not present

## 2015-05-05 DIAGNOSIS — R338 Other retention of urine: Secondary | ICD-10-CM | POA: Diagnosis not present

## 2015-05-24 ENCOUNTER — Other Ambulatory Visit: Payer: Self-pay | Admitting: Urology

## 2015-05-31 DIAGNOSIS — R338 Other retention of urine: Secondary | ICD-10-CM | POA: Diagnosis not present

## 2015-05-31 DIAGNOSIS — N312 Flaccid neuropathic bladder, not elsewhere classified: Secondary | ICD-10-CM | POA: Diagnosis not present

## 2015-06-24 ENCOUNTER — Other Ambulatory Visit: Payer: Self-pay | Admitting: Family Medicine

## 2015-07-05 ENCOUNTER — Encounter (HOSPITAL_BASED_OUTPATIENT_CLINIC_OR_DEPARTMENT_OTHER): Payer: Self-pay | Admitting: *Deleted

## 2015-07-06 ENCOUNTER — Encounter (HOSPITAL_BASED_OUTPATIENT_CLINIC_OR_DEPARTMENT_OTHER): Payer: Self-pay | Admitting: *Deleted

## 2015-07-06 NOTE — Progress Notes (Signed)
NPO AFTER MN.  ARRIVE AT 1015.  NEEDS ISTAT.  CURRENT EKG IN CHART AND EPIC.  WILL TAKE VERAPAMIL AND LANOXIN AM DOS W/ SIPS OF WATER.

## 2015-07-12 ENCOUNTER — Ambulatory Visit (HOSPITAL_BASED_OUTPATIENT_CLINIC_OR_DEPARTMENT_OTHER): Payer: Medicare Other | Admitting: Anesthesiology

## 2015-07-12 ENCOUNTER — Ambulatory Visit (HOSPITAL_BASED_OUTPATIENT_CLINIC_OR_DEPARTMENT_OTHER)
Admission: RE | Admit: 2015-07-12 | Discharge: 2015-07-12 | Disposition: A | Discharge status: Home / Self Care | Payer: Medicare Other | Source: Ambulatory Visit | Attending: Urology | Admitting: Urology

## 2015-07-12 ENCOUNTER — Encounter (HOSPITAL_BASED_OUTPATIENT_CLINIC_OR_DEPARTMENT_OTHER): Payer: Self-pay | Admitting: Anesthesiology

## 2015-07-12 ENCOUNTER — Encounter (HOSPITAL_BASED_OUTPATIENT_CLINIC_OR_DEPARTMENT_OTHER)
Admission: RE | Disposition: A | Discharge status: Home / Self Care | Payer: Self-pay | Source: Ambulatory Visit | Attending: Urology

## 2015-07-12 DIAGNOSIS — N312 Flaccid neuropathic bladder, not elsewhere classified: Secondary | ICD-10-CM | POA: Insufficient documentation

## 2015-07-12 DIAGNOSIS — R338 Other retention of urine: Secondary | ICD-10-CM | POA: Diagnosis not present

## 2015-07-12 DIAGNOSIS — Z79899 Other long term (current) drug therapy: Secondary | ICD-10-CM | POA: Insufficient documentation

## 2015-07-12 DIAGNOSIS — R339 Retention of urine, unspecified: Secondary | ICD-10-CM | POA: Diagnosis not present

## 2015-07-12 DIAGNOSIS — N1370 Vesicoureteral-reflux, unspecified: Secondary | ICD-10-CM | POA: Insufficient documentation

## 2015-07-12 DIAGNOSIS — Z853 Personal history of malignant neoplasm of breast: Secondary | ICD-10-CM | POA: Diagnosis not present

## 2015-07-12 DIAGNOSIS — I10 Essential (primary) hypertension: Secondary | ICD-10-CM | POA: Diagnosis not present

## 2015-07-12 DIAGNOSIS — E7800 Pure hypercholesterolemia, unspecified: Secondary | ICD-10-CM | POA: Insufficient documentation

## 2015-07-12 HISTORY — DX: Nonscarring hair loss, unspecified: L65.9

## 2015-07-12 HISTORY — PX: CYSTOSCOPY: SHX5120

## 2015-07-12 HISTORY — PX: INSERTION OF SUPRAPUBIC CATHETER: SHX5870

## 2015-07-12 LAB — POCT I-STAT 4, (NA,K, GLUC, HGB,HCT)
GLUCOSE: 97 mg/dL (ref 65–99)
HCT: 41 % (ref 36.0–46.0)
HEMOGLOBIN: 13.9 g/dL (ref 12.0–15.0)
POTASSIUM: 3.8 mmol/L (ref 3.5–5.1)
SODIUM: 142 mmol/L (ref 135–145)

## 2015-07-12 SURGERY — INSERTION, SUPRAPUBIC CATHETER
Anesthesia: General | Site: Bladder

## 2015-07-12 MED ORDER — LIDOCAINE HCL (CARDIAC) 20 MG/ML IV SOLN
INTRAVENOUS | Status: AC
  Filled 2015-07-12: qty 5

## 2015-07-12 MED ORDER — ONDANSETRON HCL 4 MG/2ML IJ SOLN
INTRAMUSCULAR | Status: AC
  Filled 2015-07-12: qty 2

## 2015-07-12 MED ORDER — FENTANYL CITRATE (PF) 100 MCG/2ML IJ SOLN
INTRAMUSCULAR | Status: DC | PRN
  Administered 2015-07-12 (×3): 12.5 ug via INTRAVENOUS
  Administered 2015-07-12: 25 ug via INTRAVENOUS
  Administered 2015-07-12 (×3): 12.5 ug via INTRAVENOUS

## 2015-07-12 MED ORDER — PROPOFOL 10 MG/ML IV BOLUS
INTRAVENOUS | Status: DC | PRN
  Administered 2015-07-12: 40 mg via INTRAVENOUS
  Administered 2015-07-12: 160 mg via INTRAVENOUS

## 2015-07-12 MED ORDER — FENTANYL CITRATE (PF) 100 MCG/2ML IJ SOLN
25.0000 ug | INTRAMUSCULAR | Status: DC | PRN
  Administered 2015-07-12 (×4): 25 ug via INTRAVENOUS
  Filled 2015-07-12: qty 1

## 2015-07-12 MED ORDER — FENTANYL CITRATE (PF) 100 MCG/2ML IJ SOLN
INTRAMUSCULAR | Status: AC
  Filled 2015-07-12: qty 2

## 2015-07-12 MED ORDER — LIDOCAINE-EPINEPHRINE (PF) 1 %-1:200000 IJ SOLN
INTRAMUSCULAR | Status: DC | PRN
  Administered 2015-07-12: 9 mL

## 2015-07-12 MED ORDER — STERILE WATER FOR IRRIGATION IR SOLN
Status: DC | PRN
  Administered 2015-07-12: 3000 mL

## 2015-07-12 MED ORDER — LACTATED RINGERS IV SOLN
INTRAVENOUS | Status: DC
  Administered 2015-07-12: 11:00:00 via INTRAVENOUS
  Filled 2015-07-12: qty 1000

## 2015-07-12 MED ORDER — LIDOCAINE HCL (CARDIAC) 20 MG/ML IV SOLN
INTRAVENOUS | Status: DC | PRN
  Administered 2015-07-12: 80 mg via INTRAVENOUS

## 2015-07-12 MED ORDER — CEFAZOLIN SODIUM-DEXTROSE 2-4 GM/100ML-% IV SOLN
INTRAVENOUS | Status: AC
  Filled 2015-07-12: qty 100

## 2015-07-12 MED ORDER — ONDANSETRON HCL 4 MG/2ML IJ SOLN
INTRAMUSCULAR | Status: DC | PRN
  Administered 2015-07-12: 4 mg via INTRAVENOUS

## 2015-07-12 MED ORDER — DEXTROSE 5 % IV SOLN
2.0000 g | INTRAVENOUS | Status: AC
  Administered 2015-07-12: 2 g via INTRAVENOUS
  Filled 2015-07-12: qty 20

## 2015-07-12 MED ORDER — PROPOFOL 10 MG/ML IV BOLUS
INTRAVENOUS | Status: AC
  Filled 2015-07-12: qty 20

## 2015-07-12 SURGICAL SUPPLY — 29 items
BAG DRAIN URO-CYSTO SKYTR STRL (DRAIN) ×3 IMPLANT
BAG DRN UROCATH (DRAIN) ×1
BAG URINE DRAINAGE (UROLOGICAL SUPPLIES) ×2 IMPLANT
BAG URINE LEG 500ML (DRAIN) ×2 IMPLANT
BLADE SURG 15 STRL LF DISP TIS (BLADE) ×1 IMPLANT
BLADE SURG 15 STRL SS (BLADE) ×3
CATH FOLEY 2WAY  5CC 16FR SIL (CATHETERS) ×2
CATH FOLEY 2WAY 5CC 16FR SIL (CATHETERS) IMPLANT
CLOTH BEACON ORANGE TIMEOUT ST (SAFETY) ×3 IMPLANT
GLOVE BIO SURGEON STRL SZ7.5 (GLOVE) ×3 IMPLANT
GOWN STRL REUS W/ TWL LRG LVL3 (GOWN DISPOSABLE) ×1 IMPLANT
GOWN STRL REUS W/ TWL XL LVL3 (GOWN DISPOSABLE) ×1 IMPLANT
GOWN STRL REUS W/TWL LRG LVL3 (GOWN DISPOSABLE) ×3
GOWN STRL REUS W/TWL XL LVL3 (GOWN DISPOSABLE) ×3
KIT ROOM TURNOVER WOR (KITS) ×3 IMPLANT
MANIFOLD NEPTUNE II (INSTRUMENTS) ×2 IMPLANT
NDL SAFETY ECLIPSE 18X1.5 (NEEDLE) IMPLANT
NDL SPNL 22GX7 QUINCKE BK (NEEDLE) IMPLANT
NEEDLE HYPO 18GX1.5 SHARP (NEEDLE) ×3
NEEDLE HYPO 22GX1.5 SAFETY (NEEDLE) IMPLANT
NEEDLE SPNL 22GX7 QUINCKE BK (NEEDLE) IMPLANT
NS IRRIG 500ML POUR BTL (IV SOLUTION) IMPLANT
PACK CYSTO (CUSTOM PROCEDURE TRAY) ×3 IMPLANT
SPONGE GAUZE 4X4 12PLY STER LF (GAUZE/BANDAGES/DRESSINGS) ×2 IMPLANT
SYR 20CC LL (SYRINGE) IMPLANT
TAPE CLOTH SURG 4X10 WHT LF (GAUZE/BANDAGES/DRESSINGS) ×2 IMPLANT
TUBE CONNECTING 12'X1/4 (SUCTIONS) ×1
TUBE CONNECTING 12X1/4 (SUCTIONS) ×1 IMPLANT
WATER STERILE IRR 3000ML UROMA (IV SOLUTION) ×3 IMPLANT

## 2015-07-12 NOTE — Anesthesia Postprocedure Evaluation (Signed)
Anesthesia Post Note  Patient: Mary [name]  Procedure(s) Performed: Procedure(s) (LRB): INSERTION OF SUPRAPUBIC CATHETER (N/A) CYSTOSCOPY (N/A)  Patient location during evaluation: PACU Anesthesia Type: General Level of consciousness: awake and alert Pain management: pain level controlled Vital Signs Assessment: post-procedure vital signs reviewed and stable Respiratory status: spontaneous breathing, nonlabored ventilation, respiratory function stable and patient connected to nasal cannula oxygen Cardiovascular status: blood pressure returned to baseline and stable Postop Assessment: no signs of nausea or vomiting Anesthetic complications: no    Last Vitals:  Filed Vitals:   07/12/15 1430 07/12/15 1630  BP: 165/70 127/55  Pulse: 76 69  Temp:  37 C  Resp: 16 18    Last Pain:  Filed Vitals:   07/12/15 1640  PainSc: 4                  Journiee Feldkamp J

## 2015-07-12 NOTE — Discharge Instructions (Addendum)
I have reviewed discharge instructions in detail with the patient. They will follow-up with me or their physician as scheduled. My nurse will also be calling the patients as per protocol.    Suprapubic Catheter Home Guide A suprapubic catheter is a rubber tube with a tiny balloon on the end. It is used to drain urine from the bladder. This catheter is put in your bladder through a small opening in the lower center part of your abdomen. Suprapubic refers to the area right above your pubic bone. The balloon on the end of the catheter is filled with germ-free (sterile) water. This keeps the catheter from slipping out. When the catheter is in place, your urine will drain into a collection bag. The bag can be put beside your bed at night or attached to your leg during the day. HOW TO CARE FOR YOUR CATHETER  Cleaning your skin  Clean the skin around the catheter opening every day.  Wash your hands with soap and water.  Clean the skin around the opening with a clean washcloth and soapy water. Do not pull on the tube.  Pat the area dry with a clean towel.  Your caregiver may want you to put a bandage (dressing) over the site. Do not use ointment on this area unless your caregiver tells you to. Cleaning the catheter  Ask your caregiver if you need to clean the catheter and how often.  Use only soap and water.  There may be crusts on the catheter. Put hydrogen peroxide on a cotton ball or gauze pad to remove any crust. Emptying the collection bag  You may have a large drainage bag to use at night and a smaller one for daytime. Empty the large bag every 8 hours. Empty the small bag when it is about  full.  Keep the drainage bag below the level of the catheter. This keeps urine from flowing backwards.  Hold the bag over the toilet or another container. Release the valve (spigot) at the bottom of the bag. Do not touch the opening of the spigot. Do not let the opening touch the toilet or  container.  Close the spigot tightly when the bag is empty. Cleaning the collection bag  Clean the bag every few days.  First, wash your hands.  Disconnect the tubing from the catheter. Replace the used bag with a new bag. Then you can clean the used one.  Empty the used bag completely. Rinse it out with warm water and soap or fill the bag with water and add 1 teaspoon of vinegar. Let it sit for about 30 minutes. Then drain.  The bag should be completely dry before storing it. Put it inside a plastic bag to keep it clean. Checking everything  Always make sure there are no kinks in the catheter or tubing.  Always make sure there are no leaks in the catheter, tubing, or collection bag.  RISKS AND COMPLICATIONS  Urine flow can become blocked. This can happen if the catheter or tubes are not working right. A blood clot can also block urine flow.  The catheter might irritate tissue in your body. This can cause bleeding.  The skin near the opening for the catheter may become irritated or infected.  Bacteria may get into your bladder. This can cause a urinary tract infection. HOME CARE INSTRUCTIONS  Take all medicines prescribed by your caregiver. Follow the directions carefully.  Drink 8 glasses of water every day. This produces good urine flow.  Check the skin around your catheter a few times every day. Watch for redness and swelling. Look for any fluids coming out of the opening.  Do not use powder or cream around the catheter opening.  Do not take tub baths or use pools or hot tubs.  Keep all follow-up appointments. SEEK MEDICAL CARE IF:  You leak urine.  Your skin around the catheter becomes red or sore.  Your urine flow slows down.  Your urine gets cloudy or smelly. SEEK IMMEDIATE MEDICAL CARE IF:   You have chills, nausea, or back pain.  You have trouble changing your catheter.  Your catheter comes out.  You have blood in your urine.  You have no urine  flow for 1 hour.  You have a fever.   This information is not intended to replace advice given to you by your health care provider. Make sure you discuss any questions you have with your health care provider.   Document Released: 11/22/2010 Document Revised: 07/21/2014 Document Reviewed: 11/22/2010 Elsevier Interactive Patient Education 2016 [place] Anesthesia Home Care Instructions  Activity: Get plenty of rest for the remainder of the day. A responsible adult should stay with you for 24 hours following the procedure.  For the next 24 hours, DO NOT: -Drive a car -Paediatric nurse -Drink alcoholic beverages -Take any medication unless instructed by your physician -Make any legal decisions or sign important papers.  Meals: Start with liquid foods such as gelatin or soup. Progress to regular foods as tolerated. Avoid greasy, spicy, heavy foods. If nausea and/or vomiting occur, drink only clear liquids until the nausea and/or vomiting subsides. Call your physician if vomiting continues.  Special Instructions/Symptoms: Your throat may feel dry or sore from the anesthesia or the breathing tube placed in your throat during surgery. If this causes discomfort, gargle with warm salt water. The discomfort should disappear within 24 hours.  If you had a scopolamine patch placed behind your ear for the management of post- operative nausea and/or vomiting:  1. The medication in the patch is effective for 72 hours, after which it should be removed.  Wrap patch in a tissue and discard in the trash. Wash hands thoroughly with soap and water. 2. You may remove the patch earlier than 72 hours if you experience unpleasant side effects which may include dry mouth, dizziness or visual disturbances. 3. Avoid touching the patch. Wash your hands with soap and water after contact with the patch.

## 2015-07-12 NOTE — Op Note (Signed)
Pre-operative diagnosis :   Chronic hypotonic bladder with urinary retention Postoperative diagnosis: Same Operation: Cystoscopy, placement of s-p tube  Surgeon:  S. [name], MD  First assistant: None  Anesthesia:  General LMA  Preparation:  After appropriate pre-medication, the patient was brought the operating, placed on the operating table in the dorsal supine position where general LMA anesthesia was introduced. She was then replaced in the dorsal lithotomy position with pubis was prepped with Betadine solution and draped in usual fashion. The history was reviewed. The armband was double checked.  Review history:  Acute urinary retention (R33.8)  Assessed By: [name] (Urology); Last Assessed: 05 May 2015 2. Hypotonic bladder (N31.2)  Assessed By: [name] (Urology); Last Assessed: 05 May 2015 3. Vesicoureteral-reflux (N13.70)  Assessed By: [name] (Urology); Last Assessed: 05 Feb 2015  History of Present Illness   82 yo female with a hx of hypotonic bladder returns today to discuss placement of a suprapubic catheter. She has had 7x of AUR, 4x in [place]. She has difficulty with catheterizations, and now has foley catheter-which she is tolerating.    She has a long-standing history of hypotonic bladder and is not a good candidate for performing CIC. It is noted that she has a tortuous urethra making chronic indwelling catheters also not a viable option.     She had to go back to the ER on 04/04/15 because of urinary retention. Foley was inserted & drain > 1 liter of urine. She is s/p cysto/bladder biopsy/urethral dilation on 03/01/15. She currently has no bleeding ,no back pain, no fever.      She has a history of a hypotonic bladder. and was seen recently with urinary retention, with a postvoid residual of 950 cc. She had difficult urinary catheterization.   The patient reports a history of voiding normally during the daytime,  but only problems at nighttime. She has been voiding with a double void technique, as well as physical therapy. She has multiple large bladder diverticula, which are wide mouth. These empty well. She has been given Flomax in the past while living in [place], but this did not help her empty her bladder. She has been given both bethanechol and Valium in the past to try to help her void; however, she has developed recurrence of her urinary retention despite medication. She does have a small amount of urinary incontinence when wiping with tissue after voiding. Physical therapy has helped her retain urine.    Video urodynamics was accomplished on 01/19/15, in the sitting position, and shows a first sensation of filling at 333 cc, normal desire and strong desire at 346 cc. She developed an unstable bladder contraction at that time, with a contraction pressure of 97cm H20 pressure. She voids 131 cc.    She did not leak for Valsalva leak point pressure, with an abdominal pressure generation of 78cm of water pressure.    Pressure flow study showed a voided volume of 32 cc, with a maximum flow rate of 4 cc/s, and a detrusor pressure at maximum flow of 71 cm of water. PVR is 225 cc.    Fluoroscopy showed bladder neck descent of approximately 1 cm. The bladder is trabeculated as noted on VCUG and fluoroscopy, with multiple bladder diverticula. There is right-sided reflux, mild, international grade 1-2.    This patient has a maximum bladder capacity of 950 cc. She has an unstable bladder contraction, at maximum capacity, and voids off this contraction. She is able to generate a  voluntary contraction, however, with high detrusor pressure (73 cm of water), with obstructive flow pattern. Her postvoid residual is 225 cc. EMG activity is increased during both voids. VCUG shows both trabeculation and bladder diverticula. There is right-sided reflux.   Most recently, she developed recurrent acute urinary  retention at 3:00 AM,. with [facility] ED catheterization: difficult catheterization. Her family is travelling now for the next 2 weeks.   Past Medical History  Statement of  Likelihood of Success: Excellent. TIME-OUT observed.:  Procedure:  Cystourethroscopy was accomplished, which showed multiple bladder abnormalities, including trabeculation and site will formation. In addition, the patient had edema from areas of irritation from her Foley catheter area the patient was placed in slight Trendelenburg position. With the bladder full of sterile water, a spinal needle was used to identify the bladder, by placing a spinal needle through the skin, at a point midway between the umbilicus and the pubic tubercle, under direct vision with the cystoscope. The needle entered the bladder through the anterior wall at appropriate position. The needle was removed, and a 1 synovator cutdown was accomplished. Subcutaneous tissue was dissected. The cystoscope was removed, and a [place] retractor placed. Cutdown was accomplished over the [place] tractor, until [employer] tractor was brought into the wound. The tractor was opened, and a 23 [place] Silastic catheter was placed inside the tractor, and brought antegrade through the bladder and urethra. The tractor was released, and the catheter was then brought retrograde into the bladder, under direct vision with the cystoscope. Once in the bladder, 10 mL of sterile water was placed within the catheter balloon. 9 mL of Xylocaine 1%, with epinephrine, 1-200,000 was then injected into the incision site. The bladder draining clear urine through the suprapubic tube. There was a minimum amount of bleeding. A 3-0 nylon suture was placed through the incision site, and sutured to the suprapubic catheter. Sterile dressing was applied. Cystoscope was removed. The patient was then awakened, taken to recovery room in good condition.

## 2015-07-12 NOTE — Anesthesia Procedure Notes (Signed)
Procedure Name: LMA Insertion Date/Time: 07/12/2015 12:42 PM Performed by: Justice Rocher Pre-anesthesia Checklist: Patient identified, Emergency Drugs available, Suction available and Patient being monitored Patient Re-evaluated:Patient Re-evaluated prior to inductionOxygen Delivery Method: Circle System Utilized Preoxygenation: Pre-oxygenation with 100% oxygen Intubation Type: IV induction Ventilation: Mask ventilation without difficulty LMA: LMA inserted LMA Size: 4.0 Number of attempts: 1 Airway Equipment and Method: Bite block Placement Confirmation: positive ETCO2 Tube secured with: Tape Dental Injury: Teeth and Oropharynx as per pre-operative assessment

## 2015-07-12 NOTE — Interval H&P Note (Signed)
History and Physical Interval Note:  07/12/2015 12:38 PM  Mary [name]  has presented today for surgery, with the diagnosis of HYPOTONIC BLADDER  The various methods of treatment have been discussed with the patient and family. After consideration of risks, benefits and other options for treatment, the patient has consented to  Procedure(s): INSERTION OF SUPRAPUBIC CATHETER (N/A) CYSTOSCOPY (N/A) as a surgical intervention .  The patient's history has been reviewed, patient examined, no change in status, stable for surgery.  I have reviewed the patient's chart and labs.  Questions were answered to the patient's satisfaction.     Christop Hippert I Clifford Benninger

## 2015-07-12 NOTE — Anesthesia Postprocedure Evaluation (Signed)
Anesthesia Post Note  Patient: Mary [name]  Procedure(s) Performed: Procedure(s) (LRB): INSERTION OF SUPRAPUBIC CATHETER (N/A) CYSTOSCOPY (N/A)  Anesthesia Post Evaluation  Last Vitals:  Filed Vitals:   07/12/15 1016  BP: 151/71  Pulse: 76  Temp: 36.7 C  Resp: 20    Last Pain: There were no vitals filed for this visit.               [name]

## 2015-07-12 NOTE — Anesthesia Preprocedure Evaluation (Addendum)
Anesthesia Evaluation  Patient identified by MRN, date of birth, ID band Patient awake    Reviewed: Allergy & Precautions, NPO status , Patient's Chart, lab work & pertinent test results  Airway Mallampati: II  TM Distance: >3 FB Neck ROM: Full    Dental no notable dental hx.    Pulmonary neg pulmonary ROS,    Pulmonary exam normal breath sounds clear to auscultation       Cardiovascular hypertension, Pt. on medications Normal cardiovascular exam Rhythm:Regular Rate:Normal     Neuro/Psych  Neuromuscular disease negative psych ROS   GI/Hepatic negative GI ROS, Neg liver ROS,   Endo/Other  negative endocrine ROS  Renal/GU negative Renal ROS  negative genitourinary   Musculoskeletal  (+) Arthritis ,   Abdominal   Peds negative pediatric ROS (+)  Hematology negative hematology ROS (+)   Anesthesia Other Findings   Reproductive/Obstetrics negative OB ROS                             Anesthesia Physical Anesthesia Plan  ASA: III  Anesthesia Plan: General   Post-op Pain Management:    Induction: Intravenous  Airway Management Planned: LMA  Additional Equipment:   Intra-op Plan:   Post-operative Plan: Extubation in OR  Informed Consent: I have reviewed the patients History and Physical, chart, labs and discussed the procedure including the risks, benefits and alternatives for the proposed anesthesia with the patient or authorized representative who has indicated his/her understanding and acceptance.   Dental advisory given  Plan Discussed with: CRNA  Anesthesia Plan Comments:         Anesthesia Quick Evaluation

## 2015-07-12 NOTE — H&P (Signed)
Reason For Visit F/u   Active Problems Problems  1. Acute urinary retention (R33.8)   Assessed By: [name] (Urology); Last Assessed: 05 May 2015 2. Hypotonic bladder (N31.2)   Assessed By: [name] (Urology); Last Assessed: 05 May 2015 3. Vesicoureteral-reflux (N13.70)   Assessed By: [name] (Urology); Last Assessed: 05 Feb 2015  History of Present Illness    82 yo [name] with a hx of hypotonic bladder returns today to discuss placement of a suprapubic catheter. She has had 7x of AUR, 4x in [place]. She has difficulty with catheterizations, and now has foley catheter-which she is tolerating.     She has a long-standing history of hypotonic bladder and is not a good candidate for performing CIC. It is noted that she has a tortuous urethra making chronic indwelling catheters also not a viable option.      She had to go back to the ER on 04/04/15 because of urinary retention. Foley was inserted & drain > 1 liter of urine. She is s/p cysto/bladder biopsy/urethral dilation on 03/01/15. She currently has no bleeding ,no back pain, no fever.       She has a history of a hypotonic bladder. and was seen recently with urinary retention, with a postvoid residual of 950 cc. She had difficult urinary catheterization.   The patient reports a history of voiding normally during the daytime, but only problems at nighttime. She has been voiding with a double void technique, as well as physical therapy. She has multiple large bladder diverticula, which are wide mouth. These empty well. She has been given Flomax in the past while living in [place], but this did not help her empty her bladder. She has been given both bethanechol and Valium in the past to try to help her void; however, she has developed recurrence of her urinary retention despite medication. She does have a small amount of urinary incontinence when wiping with tissue after voiding. Physical therapy  has helped her retain urine.    Video urodynamics was accomplished on 01/19/15, in the sitting position, and shows a first sensation of filling at 333 cc, normal desire and strong desire at 346 cc. She developed an unstable bladder contraction at that time, with a contraction pressure of 97cm H20 pressure. She voids 131 cc.    She did not leak for Valsalva leak point pressure, with an abdominal pressure generation of 78cm of water pressure.    Pressure flow study showed a voided volume of 32 cc, with a maximum flow rate of 4 cc/s, and a detrusor pressure at maximum flow of 71 cm of water. PVR is 225 cc.    Fluoroscopy showed bladder neck descent of approximately 1 cm. The bladder is trabeculated as noted on VCUG and fluoroscopy, with multiple bladder diverticula. There is right-sided reflux, mild, international grade 1-2.    This patient has a maximum bladder capacity of 950 cc. She has an unstable bladder contraction, at maximum capacity, and voids off this contraction. She is able to generate a voluntary contraction, however, with high detrusor pressure (73 cm of water), with obstructive flow pattern. Her postvoid residual is 225 cc. EMG activity is increased during both voids. VCUG shows both trabeculation and bladder diverticula. There is right-sided reflux.    Most recently, she developed recurrent acute urinary retention at 3:00 AM,. with [facility] ED catheterization: difficult catheterization. Her family is travelling now for the next 2 weeks.   Past Medical History Problems  1. History of  Breast Cancer 2. History of hypercholesterolemia (Z86.39)  Surgical History Problems  1. History of Breast Surgery Mastectomy 2. History of Cystoscopy With Fulguration Small Lesion (5-55mm) 3. History of Knee Surgery Right  Current Meds 1. Atorvastatin Calcium 40 MG Oral Tablet;  Therapy: (Recorded:27Feb2013) to Recorded 2. Bethanechol Chloride 25 MG Oral Tablet; ONE TABLET TID;  Therapy:  [phone] to (Evaluate:23Nov2016)  Requested for: 21Oct2016; Last  Rx:21Oct2016 Ordered 3. Calan SR 240 MG Oral Tablet Extended Release; TAKE 0.5 TABLET Daily;  Therapy: (Recorded:15Jul2009) to Recorded 4. Caltrate 600 TABS;  Therapy: (Recorded:27Feb2013) to Recorded 5. Centrum TABS;  Therapy: (Recorded:15Jul2009) to Recorded 6. Ciprofloxacin HCl - 500 MG Oral Tablet; TAKE 1 TABLET BID;  Therapy: [id number] to (Last [id number])  Requested for: 24Jan2017 Ordered 7. Lanoxin 125 MCG Oral Tablet;  Therapy: (Recorded:15Jul2009) to Recorded 8. Methocarbamol 500 MG Oral Tablet; is taking prn muscle spasms;  Therapy: (Recorded:09Dec2014) to Recorded 9. Pyridium 200 MG Oral Tablet; TAKE 1 TABLET BY MOUTH 3 TIMES A DAY FOR BURNING;  Therapy: [id number] to (Evaluate:21Feb2017)  Requested for: 24Jan2017; Last  Rx:24Jan2017 Ordered 10. Tylenol CAPS; prn knee pain;   Therapy: (Recorded:09Dec2014) to Recorded  Allergies Medication  1. Bactrim TABS 2. Biaxin TABS 3. Macrobid CAPS 4. Penicillins 5. Sulfa Drugs  Family History Problems  1. Family history of Death In The Family Father   Deceased at age 42 2. Family history of Death In The Family Mother   Deceased at age 57 3. Family history of Family Health Status Number Of Children   1 Son & 2 Daughters 4. No pertinent family history : Mother  Social History Problems  1. Activities Of Daily Living 2. Activities of daily living (ADL's), independent 3. Denied: History of Alcohol Use (History) 4. Caffeine Use   1-2 5. Exercises regularly   no reg exer but is active at home 6. Living Independently 7. Living Situation: Supportive and safe 8. Marital History - Widowed 9. Never A Smoker 10. Retired From Work 46. Self-reliant In Usual Daily Activities  Review of Systems Genitourinary, constitutional, skin, eye, otolaryngeal, hematologic/lymphatic, cardiovascular, pulmonary, endocrine, musculoskeletal, gastrointestinal, neurological  and psychiatric system(s) were reviewed and pertinent findings if present are noted and are otherwise negative.  Genitourinary: incomplete emptying of bladder.    Vitals Vital Signs [Data Includes: Last 1 Day]  Recorded: [id number] 01:33PM  Blood Pressure: 186 / 93 Heart Rate: 89  Physical Exam Constitutional: Well nourished and well developed . No acute distress.  ENT:. The ears and nose are normal in appearance.  Neck: The appearance of the neck is normal and no neck mass is present.  Pulmonary: No respiratory distress and normal respiratory rhythm and effort.  Cardiovascular: Heart rate and rhythm are normal . No peripheral edema.  Abdomen: The abdomen is soft and nontender. No masses are palpated. No CVA tenderness. No hernias are palpable. No hepatosplenomegaly noted.  Lymphatics: The femoral and inguinal nodes are not enlarged or tender.  Skin: Normal skin turgor, no visible rash and no visible skin lesions.  Neuro/Psych:. Mood and affect are appropriate.    Assessment Assessed  1. 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 Acute urinary retention (R33.8)  82 yo [name] with hx of recurrent urinary retention, and high post void residuals, failing combination of bethanechol and Valium. Ideally, She would be a candidate for self cath, or [place] for daily I/O cath ( which she refuses); but she is not interested in learning self cath technique.    We have discussed the importance  of keeping her independent ( lives by herself); and not having to go to the ER at all times of the night -not to mention that it is difficult for the ER nursing staff to cath her. Therefore, I have recommended that she consider placement of a s-p tube for her own independence.   Plan Recommended most practical solution:   1. Discuss case with nephew, Dr. Girtha Rm  2. renew Physical Therapy with [facility], after #3  3. consider placement of s-p tube with plug.   4. could have s-p tube removed if she begins to void  normally and wants it removed.   Discussion/Summary Ellery Plunk MD, c/o [facility] [place],MD [place] Electronically signed by : [name], M.D.; May 05 2015  2:25PM EST

## 2015-07-12 NOTE — Transfer of Care (Signed)
Immediate Anesthesia Transfer of Care Note  Patient: Mary [name]  Procedure(s) Performed: Procedure(s) (LRB): INSERTION OF SUPRAPUBIC CATHETER (N/A) CYSTOSCOPY (N/A)  Patient Location: PACU  Anesthesia Type: General  Level of Consciousness: awake, sedated, patient cooperative and responds to stimulation  Airway & Oxygen Therapy: Patient Spontanous Breathing and Patient connected to face mask oxygen  Post-op Assessment: Report given to PACU RN, Post -op Vital signs reviewed and stable and Patient moving all extremities  Post vital signs: Reviewed and stable  Complications: No apparent anesthesia complications

## 2015-07-13 ENCOUNTER — Encounter (HOSPITAL_BASED_OUTPATIENT_CLINIC_OR_DEPARTMENT_OTHER): Payer: Self-pay | Admitting: Urology

## 2015-07-27 DIAGNOSIS — R338 Other retention of urine: Secondary | ICD-10-CM | POA: Diagnosis not present

## 2015-07-27 DIAGNOSIS — N3644 Muscular disorders of urethra: Secondary | ICD-10-CM | POA: Diagnosis not present

## 2015-07-27 DIAGNOSIS — M6281 Muscle weakness (generalized): Secondary | ICD-10-CM | POA: Diagnosis not present

## 2015-07-27 DIAGNOSIS — R278 Other lack of coordination: Secondary | ICD-10-CM | POA: Diagnosis not present

## 2015-08-03 DIAGNOSIS — N3644 Muscular disorders of urethra: Secondary | ICD-10-CM | POA: Diagnosis not present

## 2015-08-03 DIAGNOSIS — R338 Other retention of urine: Secondary | ICD-10-CM | POA: Diagnosis not present

## 2015-08-03 DIAGNOSIS — M6281 Muscle weakness (generalized): Secondary | ICD-10-CM | POA: Diagnosis not present

## 2015-08-03 DIAGNOSIS — R278 Other lack of coordination: Secondary | ICD-10-CM | POA: Diagnosis not present

## 2015-08-06 ENCOUNTER — Other Ambulatory Visit: Payer: Self-pay | Admitting: Family Medicine

## 2015-08-12 DIAGNOSIS — L661 Lichen planopilaris: Secondary | ICD-10-CM | POA: Diagnosis not present

## 2015-08-17 DIAGNOSIS — Z0000 Encounter for general adult medical examination without abnormal findings: Secondary | ICD-10-CM | POA: Diagnosis not present

## 2015-08-17 DIAGNOSIS — R278 Other lack of coordination: Secondary | ICD-10-CM | POA: Diagnosis not present

## 2015-08-17 DIAGNOSIS — R338 Other retention of urine: Secondary | ICD-10-CM | POA: Diagnosis not present

## 2015-08-17 DIAGNOSIS — M6281 Muscle weakness (generalized): Secondary | ICD-10-CM | POA: Diagnosis not present

## 2015-08-17 DIAGNOSIS — R3989 Other symptoms and signs involving the genitourinary system: Secondary | ICD-10-CM | POA: Diagnosis not present

## 2015-08-17 DIAGNOSIS — N312 Flaccid neuropathic bladder, not elsewhere classified: Secondary | ICD-10-CM | POA: Diagnosis not present

## 2015-08-31 DIAGNOSIS — N312 Flaccid neuropathic bladder, not elsewhere classified: Secondary | ICD-10-CM | POA: Diagnosis not present

## 2015-09-02 DIAGNOSIS — R278 Other lack of coordination: Secondary | ICD-10-CM | POA: Diagnosis not present

## 2015-09-02 DIAGNOSIS — R339 Retention of urine, unspecified: Secondary | ICD-10-CM | POA: Diagnosis not present

## 2015-09-02 DIAGNOSIS — M6281 Muscle weakness (generalized): Secondary | ICD-10-CM | POA: Diagnosis not present

## 2015-09-20 ENCOUNTER — Other Ambulatory Visit: Payer: Self-pay

## 2015-09-20 DIAGNOSIS — Z853 Personal history of malignant neoplasm of breast: Secondary | ICD-10-CM

## 2015-09-22 ENCOUNTER — Ambulatory Visit: Payer: Medicare Other

## 2015-09-22 ENCOUNTER — Other Ambulatory Visit (HOSPITAL_BASED_OUTPATIENT_CLINIC_OR_DEPARTMENT_OTHER): Payer: Medicare Other

## 2015-09-22 ENCOUNTER — Ambulatory Visit (HOSPITAL_BASED_OUTPATIENT_CLINIC_OR_DEPARTMENT_OTHER): Payer: Medicare Other | Admitting: Oncology

## 2015-09-22 ENCOUNTER — Telehealth: Payer: Self-pay | Admitting: Oncology

## 2015-09-22 VITALS — BP 186/85 | HR 82 | Temp 97.9°F | Resp 20 | Ht 62.5 in | Wt 124.8 lb

## 2015-09-22 DIAGNOSIS — M810 Age-related osteoporosis without current pathological fracture: Secondary | ICD-10-CM | POA: Diagnosis not present

## 2015-09-22 DIAGNOSIS — C50411 Malignant neoplasm of upper-outer quadrant of right female breast: Secondary | ICD-10-CM

## 2015-09-22 DIAGNOSIS — Z853 Personal history of malignant neoplasm of breast: Secondary | ICD-10-CM

## 2015-09-22 LAB — COMPREHENSIVE METABOLIC PANEL
ALT: 19 U/L (ref 0–55)
ANION GAP: 11 meq/L (ref 3–11)
AST: 16 U/L (ref 5–34)
Albumin: 3.7 g/dL (ref 3.5–5.0)
Alkaline Phosphatase: 86 U/L (ref 40–150)
BUN: 16.3 mg/dL (ref 7.0–26.0)
CALCIUM: 9.7 mg/dL (ref 8.4–10.4)
CHLORIDE: 103 meq/L (ref 98–109)
CO2: 28 mEq/L (ref 22–29)
CREATININE: 0.9 mg/dL (ref 0.6–1.1)
EGFR: 62 mL/min/{1.73_m2} — AB (ref >90)
Glucose: 77 mg/dl (ref 70–140)
Potassium: 3.8 mEq/L (ref 3.5–5.1)
Sodium: 142 mEq/L (ref 136–145)
Total Bilirubin: 0.5 mg/dL (ref 0.20–1.20)
Total Protein: 7.4 g/dL (ref 6.4–8.3)

## 2015-09-22 LAB — CBC WITH DIFFERENTIAL/PLATELET
BASO%: 0.5 % (ref 0.0–2.0)
BASOS ABS: 0 10*3/uL (ref 0.0–0.1)
EOS ABS: 0.3 10*3/uL (ref 0.0–0.5)
EOS%: 4.3 % (ref 0.0–7.0)
HEMATOCRIT: 37.6 % (ref 34.8–46.6)
HGB: 12.2 g/dL (ref 11.6–15.9)
LYMPH#: 1.5 10*3/uL (ref 0.9–3.3)
LYMPH%: 26.2 % (ref 14.0–49.7)
MCH: 28.1 pg (ref 25.1–34.0)
MCHC: 32.4 g/dL (ref 31.5–36.0)
MCV: 86.6 fL (ref 79.5–101.0)
MONO#: 0.6 10*3/uL (ref 0.1–0.9)
MONO%: 9.7 % (ref 0.0–14.0)
NEUT#: 3.4 10*3/uL (ref 1.5–6.5)
NEUT%: 59.3 % (ref 38.4–76.8)
PLATELETS: 226 10*3/uL (ref 145–400)
RBC: 4.34 10*6/uL (ref 3.70–5.45)
RDW: 15.3 % — ABNORMAL HIGH (ref 11.2–14.5)
WBC: 5.8 10*3/uL (ref 3.9–10.3)
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
## 2015-09-22 NOTE — Telephone Encounter (Signed)
Patient stopped back at scheduling and stated she was not able to get injection today and would need to schedule for 8/8 as previously requested by GM (7/5 los). Checked with injection nurse re why patient not able to have injection today - per injection nurse lab needed for injection was not drawn and injection not able to be given - patient will need to come back. Patient ok with coming back 8/8 - scheduled lab/inj for 8/8 and gave patient new avs report with appointments.

## 2015-09-22 NOTE — Patient Instructions (Signed)
Denosumab injection  What is this medicine?  DENOSUMAB (den oh sue mab) slows bone breakdown. Prolia is used to treat osteoporosis in women after menopause and in men. Xgeva is used to prevent bone fractures and other bone problems caused by cancer bone metastases. Xgeva is also used to treat giant cell tumor of the bone.  This medicine may be used for other purposes; ask your health care provider or pharmacist if you have questions.  What should I tell my health care provider before I take this medicine?  They need to know if you have any of these conditions:  -dental disease  -eczema  -infection or history of infections  -kidney disease or on dialysis  -low blood calcium or vitamin D  -malabsorption syndrome  -scheduled to have surgery or tooth extraction  -taking medicine that contains denosumab  -thyroid or parathyroid disease  -an unusual reaction to denosumab, other medicines, foods, dyes, or preservatives  -pregnant or trying to get pregnant  -breast-feeding  How should I use this medicine?  This medicine is for injection under the skin. It is given by a health care professional in a hospital or clinic setting.  If you are getting Prolia, a special MedGuide will be given to you by the pharmacist with each prescription and refill. Be sure to read this information carefully each time.  For Prolia, talk to your pediatrician regarding the use of this medicine in children. Special care may be needed. For Xgeva, talk to your pediatrician regarding the use of this medicine in children. While this drug may be prescribed for children as young as 13 years for selected conditions, precautions do apply.  Overdosage: If you think you have taken too much of this medicine contact a poison control center or emergency room at once.  NOTE: This medicine is only for you. Do not share this medicine with others.  What if I miss a dose?  It is important not to miss your dose. Call your doctor or health care professional if you are  unable to keep an appointment.  What may interact with this medicine?  Do not take this medicine with any of the following medications:  -other medicines containing denosumab  This medicine may also interact with the following medications:  -medicines that suppress the immune system  -medicines that treat cancer  -steroid medicines like prednisone or cortisone  This list may not describe all possible interactions. Give your health care provider a list of all the medicines, herbs, non-prescription drugs, or dietary supplements you use. Also tell them if you smoke, drink alcohol, or use illegal drugs. Some items may interact with your medicine.  What should I watch for while using this medicine?  Visit your doctor or health care professional for regular checks on your progress. Your doctor or health care professional may order blood tests and other tests to see how you are doing.  Call your doctor or health care professional if you get a cold or other infection while receiving this medicine. Do not treat yourself. This medicine may decrease your body's ability to fight infection.  You should make sure you get enough calcium and vitamin D while you are taking this medicine, unless your doctor tells you not to. Discuss the foods you eat and the vitamins you take with your health care professional.  See your dentist regularly. Brush and floss your teeth as directed. Before you have any dental work done, tell your dentist you are receiving this medicine.  Do   not become pregnant while taking this medicine or for 5 months after stopping it. Women should inform their doctor if they wish to become pregnant or think they might be pregnant. There is a potential for serious side effects to an unborn child. Talk to your health care professional or pharmacist for more information.  What side effects may I notice from receiving this medicine?  Side effects that you should report to your doctor or health care professional as soon as  possible:  -allergic reactions like skin rash, itching or hives, swelling of the face, lips, or tongue  -breathing problems  -chest pain  -fast, irregular heartbeat  -feeling faint or lightheaded, falls  -fever, chills, or any other sign of infection  -muscle spasms, tightening, or twitches  -numbness or tingling  -skin blisters or bumps, or is dry, peels, or red  -slow healing or unexplained pain in the mouth or jaw  -unusual bleeding or bruising  Side effects that usually do not require medical attention (Report these to your doctor or health care professional if they continue or are bothersome.):  -muscle pain  -stomach upset, gas  This list may not describe all possible side effects. Call your doctor for medical advice about side effects. You may report side effects to FDA at 1-800-FDA-1088.  Where should I keep my medicine?  This medicine is only given in a clinic, doctor's office, or other health care setting and will not be stored at home.  NOTE: This sheet is a summary. It may not cover all possible information. If you have questions about this medicine, talk to your doctor, pharmacist, or health care provider.      2016, Elsevier/Gold Standard. (2011-09-04 12:37:47)

## 2015-09-22 NOTE — Telephone Encounter (Signed)
Gave patient avs report and appointments for July 2018. Per GM due to patient on schedule for injection today no injection needed 10/26/15. Patient will have injection today and next year after f/u with GM (per GM).

## 2015-09-24 ENCOUNTER — Other Ambulatory Visit: Payer: Self-pay | Admitting: Family Medicine

## 2015-09-28 DIAGNOSIS — R338 Other retention of urine: Secondary | ICD-10-CM | POA: Diagnosis not present

## 2015-09-28 DIAGNOSIS — N312 Flaccid neuropathic bladder, not elsewhere classified: Secondary | ICD-10-CM | POA: Diagnosis not present

## 2015-10-04 ENCOUNTER — Ambulatory Visit: Payer: Medicare Other

## 2015-10-04 ENCOUNTER — Ambulatory Visit: Payer: Medicare Other | Admitting: Oncology

## 2015-10-19 ENCOUNTER — Other Ambulatory Visit: Payer: Self-pay | Admitting: Family Medicine

## 2015-10-26 ENCOUNTER — Ambulatory Visit (HOSPITAL_BASED_OUTPATIENT_CLINIC_OR_DEPARTMENT_OTHER): Payer: Medicare Other

## 2015-10-26 ENCOUNTER — Other Ambulatory Visit (HOSPITAL_BASED_OUTPATIENT_CLINIC_OR_DEPARTMENT_OTHER): Payer: Medicare Other

## 2015-10-26 VITALS — BP 165/90 | HR 77 | Temp 98.2°F | Resp 20

## 2015-10-26 DIAGNOSIS — M810 Age-related osteoporosis without current pathological fracture: Secondary | ICD-10-CM | POA: Diagnosis not present

## 2015-10-26 DIAGNOSIS — C50411 Malignant neoplasm of upper-outer quadrant of right female breast: Secondary | ICD-10-CM

## 2015-10-26 DIAGNOSIS — Z853 Personal history of malignant neoplasm of breast: Secondary | ICD-10-CM

## 2015-10-26 LAB — CBC WITH DIFFERENTIAL/PLATELET
BASO%: 1.1 % (ref 0.0–2.0)
BASOS ABS: 0.1 10*3/uL (ref 0.0–0.1)
EOS ABS: 0.2 10*3/uL (ref 0.0–0.5)
EOS%: 3.7 % (ref 0.0–7.0)
HCT: 39.1 % (ref 34.8–46.6)
HGB: 12.9 g/dL (ref 11.6–15.9)
LYMPH%: 16.9 % (ref 14.0–49.7)
MCH: 27.8 pg (ref 25.1–34.0)
MCHC: 32.9 g/dL (ref 31.5–36.0)
MCV: 84.5 fL (ref 79.5–101.0)
MONO#: 0.8 10*3/uL (ref 0.1–0.9)
MONO%: 12.4 % (ref 0.0–14.0)
NEUT#: 4.4 10*3/uL (ref 1.5–6.5)
NEUT%: 65.9 % (ref 38.4–76.8)
PLATELETS: 230 10*3/uL (ref 145–400)
RBC: 4.63 10*6/uL (ref 3.70–5.45)
RDW: 15.7 % — ABNORMAL HIGH (ref 11.2–14.5)
WBC: 6.6 10*3/uL (ref 3.9–10.3)
lymph#: 1.1 10*3/uL (ref 0.9–3.3)

## 2015-10-26 LAB — COMPREHENSIVE METABOLIC PANEL
ALT: 17 U/L (ref 0–55)
AST: 14 U/L (ref 5–34)
Albumin: 3.8 g/dL (ref 3.5–5.0)
Alkaline Phosphatase: 100 U/L (ref 40–150)
Anion Gap: 9 mEq/L (ref 3–11)
BUN: 15.4 mg/dL (ref 7.0–26.0)
CHLORIDE: 102 meq/L (ref 98–109)
CO2: 31 meq/L — AB (ref 22–29)
CREATININE: 0.9 mg/dL (ref 0.6–1.1)
Calcium: 10.3 mg/dL (ref 8.4–10.4)
EGFR: 59 mL/min/{1.73_m2} — ABNORMAL LOW (ref >90)
GLUCOSE: 81 mg/dL (ref 70–140)
POTASSIUM: 4.1 meq/L (ref 3.5–5.1)
SODIUM: 141 meq/L (ref 136–145)
Total Bilirubin: 0.66 mg/dL (ref 0.20–1.20)
Total Protein: 7.7 g/dL (ref 6.4–8.3)

## 2015-10-26 MED ORDER — DENOSUMAB 60 MG/ML ~~LOC~~ SOLN
60.0000 mg | Freq: Once | SUBCUTANEOUS | Status: AC
  Administered 2015-10-26: 60 mg via SUBCUTANEOUS
  Filled 2015-10-26: qty 1

## 2015-10-26 NOTE — Patient Instructions (Signed)
Denosumab injection  What is this medicine?  DENOSUMAB (den oh sue mab) slows bone breakdown. Prolia is used to treat osteoporosis in women after menopause and in men. Xgeva is used to prevent bone fractures and other bone problems caused by cancer bone metastases. Xgeva is also used to treat giant cell tumor of the bone.  This medicine may be used for other purposes; ask your health care provider or pharmacist if you have questions.  What should I tell my health care provider before I take this medicine?  They need to know if you have any of these conditions:  -dental disease  -eczema  -infection or history of infections  -kidney disease or on dialysis  -low blood calcium or vitamin D  -malabsorption syndrome  -scheduled to have surgery or tooth extraction  -taking medicine that contains denosumab  -thyroid or parathyroid disease  -an unusual reaction to denosumab, other medicines, foods, dyes, or preservatives  -pregnant or trying to get pregnant  -breast-feeding  How should I use this medicine?  This medicine is for injection under the skin. It is given by a health care professional in a hospital or clinic setting.  If you are getting Prolia, a special MedGuide will be given to you by the pharmacist with each prescription and refill. Be sure to read this information carefully each time.  For Prolia, talk to your pediatrician regarding the use of this medicine in children. Special care may be needed. For Xgeva, talk to your pediatrician regarding the use of this medicine in children. While this drug may be prescribed for children as young as 13 years for selected conditions, precautions do apply.  Overdosage: If you think you have taken too much of this medicine contact a poison control center or emergency room at once.  NOTE: This medicine is only for you. Do not share this medicine with others.  What if I miss a dose?  It is important not to miss your dose. Call your doctor or health care professional if you are  unable to keep an appointment.  What may interact with this medicine?  Do not take this medicine with any of the following medications:  -other medicines containing denosumab  This medicine may also interact with the following medications:  -medicines that suppress the immune system  -medicines that treat cancer  -steroid medicines like prednisone or cortisone  This list may not describe all possible interactions. Give your health care provider a list of all the medicines, herbs, non-prescription drugs, or dietary supplements you use. Also tell them if you smoke, drink alcohol, or use illegal drugs. Some items may interact with your medicine.  What should I watch for while using this medicine?  Visit your doctor or health care professional for regular checks on your progress. Your doctor or health care professional may order blood tests and other tests to see how you are doing.  Call your doctor or health care professional if you get a cold or other infection while receiving this medicine. Do not treat yourself. This medicine may decrease your body's ability to fight infection.  You should make sure you get enough calcium and vitamin D while you are taking this medicine, unless your doctor tells you not to. Discuss the foods you eat and the vitamins you take with your health care professional.  See your dentist regularly. Brush and floss your teeth as directed. Before you have any dental work done, tell your dentist you are receiving this medicine.  Do   not become pregnant while taking this medicine or for 5 months after stopping it. Women should inform their doctor if they wish to become pregnant or think they might be pregnant. There is a potential for serious side effects to an unborn child. Talk to your health care professional or pharmacist for more information.  What side effects may I notice from receiving this medicine?  Side effects that you should report to your doctor or health care professional as soon as  possible:  -allergic reactions like skin rash, itching or hives, swelling of the face, lips, or tongue  -breathing problems  -chest pain  -fast, irregular heartbeat  -feeling faint or lightheaded, falls  -fever, chills, or any other sign of infection  -muscle spasms, tightening, or twitches  -numbness or tingling  -skin blisters or bumps, or is dry, peels, or red  -slow healing or unexplained pain in the mouth or jaw  -unusual bleeding or bruising  Side effects that usually do not require medical attention (Report these to your doctor or health care professional if they continue or are bothersome.):  -muscle pain  -stomach upset, gas  This list may not describe all possible side effects. Call your doctor for medical advice about side effects. You may report side effects to FDA at 1-800-FDA-1088.  Where should I keep my medicine?  This medicine is only given in a clinic, doctor's office, or other health care setting and will not be stored at home.  NOTE: This sheet is a summary. It may not cover all possible information. If you have questions about this medicine, talk to your doctor, pharmacist, or health care provider.      2016, Elsevier/Gold Standard. (2011-09-04 12:37:47)

## 2015-10-27 DIAGNOSIS — N312 Flaccid neuropathic bladder, not elsewhere classified: Secondary | ICD-10-CM | POA: Diagnosis not present

## 2015-10-27 LAB — VITAMIN D 25 HYDROXY (VIT D DEFICIENCY, FRACTURES): Vitamin D, 25-Hydroxy: 58.2 ng/mL (ref 30.0–100.0)

## 2015-11-05 DIAGNOSIS — Z471 Aftercare following joint replacement surgery: Secondary | ICD-10-CM | POA: Diagnosis not present

## 2015-11-05 DIAGNOSIS — Z96651 Presence of right artificial knee joint: Secondary | ICD-10-CM | POA: Diagnosis not present

## 2015-11-12 DIAGNOSIS — L821 Other seborrheic keratosis: Secondary | ICD-10-CM | POA: Diagnosis not present

## 2015-11-12 DIAGNOSIS — D1801 Hemangioma of skin and subcutaneous tissue: Secondary | ICD-10-CM | POA: Diagnosis not present

## 2015-11-12 DIAGNOSIS — L231 Allergic contact dermatitis due to adhesives: Secondary | ICD-10-CM | POA: Diagnosis not present

## 2015-11-12 DIAGNOSIS — L661 Lichen planopilaris: Secondary | ICD-10-CM | POA: Diagnosis not present

## 2015-11-12 DIAGNOSIS — L820 Inflamed seborrheic keratosis: Secondary | ICD-10-CM | POA: Diagnosis not present

## 2015-11-12 DIAGNOSIS — Z85828 Personal history of other malignant neoplasm of skin: Secondary | ICD-10-CM | POA: Diagnosis not present

## 2015-11-15 ENCOUNTER — Other Ambulatory Visit: Payer: Self-pay

## 2015-11-17 NOTE — Progress Notes (Signed)
Chief Complaint  Patient presents with  . Medicare Wellness    nonfasting med check plus/AWV-no concerns. Wanted to let you know she now has supra pubic catheter. And also wants to know if she should still be taking calcium plus d.  . Flu Vaccine    would like to get later on at [facility].     Mary [name] is a 82 y.o. female who presents for annual wellness visit and follow-up on chronic medical conditions.   Hypertension and palpitation follow-up: Blood pressures are not checked elsewhere. Denies dizziness, headaches, chest pain. Denies side effects of medications. Palpitations are well controlled. She had been switched to generic digoxin in July 2016 when branded Lanoxin wasn't available.  It is now available again and she is back on the brand.  She has been doing well with the generic verapamil.  Slight headache and left sided neck pain starting today.  Started physical therapy this morning for R leg/knee pain.  She saw Dr. Anne Fu PA 2 weeks ago, got an injection on the side of the knee, and started PT today.  Hyperlipidemia follow-up: Patient is reportedly following a low cholesterol diet. Compliant with medications and denies medication side effects.She is no longer taking fish oil, as she had a hard time swallowing it, and seems similar to her AREDs.  She continues to have 2 [place] peppermint patties each day. Last TG was elevated at 250.  She is not fasting today. Lab Results  Component Value Date   CHOL 204 (H) 04/26/2015   HDL 59 04/26/2015   LDLCALC 95 04/26/2015   TRIG 250 (H) 04/26/2015   CHOLHDL 3.5 04/26/2015    She has had ongoing problems with urinary retention.  She had cystoscopy and bladder biopsy, with urethral dilation 03/01/15. She kept the catheter in afterwards in order to travel to [place] with her daughter.  1/11 she had it removed, developed retention, had to go to ER a few days later and has had catheter back in since then.  She had been given valium 2mg  BID  to help initiate void (to help with urinary retention), which wasn't effective. Eventually she had suprapubic cath placed.  She catheterizes 3 times/daily and at bedtime, sometimes more. Denies pain, abnormal urine.  Dr. Delman Cheadle had been treating her for alopecia with injections and topical steroids. She still using some topical medications, no longer getting injections.  She recently saw her for a full skin check.  She was started on Prolia injections by Dr. [name] in July 2014--she is getting just once a year. Last injection was 10/2015.  She is taking calcium and vitamin D. Not getting any weight-bearing exercise. DEXA last done 01/2015, showing osteoporosis. She is asking about her calcium pills, whether or not she needs to take them.  She drinks milk, eats yogurt.  Immunization History  Administered Date(s) Administered  . Influenza Split 12/01/2010, 12/18/2012, 01/02/2015, 01/02/2015  . Influenza-Unspecified 12/08/2013  . Pneumococcal Conjugate-13 04/15/2014  . Pneumococcal Polysaccharide-23 12/19/2002, 09/04/2011  . Td 11/19/2003  . Tdap 09/04/2011   we have discussed shingles vaccine in the past, hasn't gotten, doesn't want Last Pap smear: 2013 Last mammogram: 12/2014 Last colonoscopy: 2013, told no further was needed Last DEXA: 01/2015 T-3.1 at R fem neck Dentist: twice yearly Ophtho: yearly Exercise: limited, due to ongoing problems with right knee pain; getting PT (started today)  Other doctors caring for patient include: Dr. Diamantina Monks Dr. Geronimo Running Dr. Olga Coaster (just prn, for cerumen removal) Dr. Lutricia Horsfall Dr. Mare Ferrari Dr. Magrinat--oncologist  Dr. Belinda Block Dr. Dorothe Pea  Depression, Fall and Functional Status Screen are all unremarkable See full screen in epic.  End of Life Discussion:  Patient has a living will and medical power of attorney  Past Medical History:  Diagnosis Date  . Alopecia   . Ashkenazi Jewish ancestry    . External hemorrhoid   . Foley catheter in place   . Heart palpitations no cardiologist--  monitored by pcp   per pt "has been on verapamil and lanoxin for years and no palpitations for a very long time"  . History of acute bronchitis    dx 12-21-2014  finished ZPAK  . History of adenomatous polyp of colon    2003  . History of basal cell carcinoma excision    2006  left nasolabial fold  . History of benign colon tumor    2007  --  HIGH GRADE HYPERPLASTIC ,  S/P  LEFT HEMICOLECTOMY  . History of breast cancer ONCOLOGIST-  DR [name]--  antiestogen therapy with femera completed 12/  2012--  no recurrence   dx 10/  2007  right breast Invasive DCIS, grade 2, Stage 2A, pT2  pN0 pMX,  (ER 100% /PR 3% +),  HER +3) with 1 metastatic axill node---  s/p  right mastectomy   . Hypotonic bladder UROLOGIST-  DR [name]  . Mixed hyperlipidemia   . OA (osteoarthritis)    hip  . Osteoporosis    DEXA 01/2009; T-3.2 L fem neck, -2.4 L radius  . Urinary retention    02-13-2015    Past Surgical History:  Procedure Laterality Date  . APPENDECTOMY  age 38  . BREAST LUMPECTOMY WITH NEEDLE LOCALIZATION AND AXILLARY SENTINEL LYMPH NODE BX Right 01-23-2006  . CARPAL TUNNEL RELEASE Right 04-24-2007   and Pulley Release index and small finger  . CATARACT EXTRACTION W/ INTRAOCULAR LENS  IMPLANT, BILATERAL  left 1991  &  right 1993  . COLONOSCOPY  last one 2013  . CYSTOSCOPY N/A 07/12/2015   Procedure: CYSTOSCOPY;  Surgeon: [name], MD;  Location: [facility];  Service: Urology;  Laterality: N/A;  . CYSTOSCOPY WITH BIOPSY N/A 03/01/2015   Procedure: CYSTOSCOPY WITH TAUBER BIOPSY OF 1CM RIGHT LATERAL BLADDER WALL AND CAUTERIZATION OF BIOPSY SITE ;  Surgeon: [name], MD;  Location: [facility];  Service: Urology;  Laterality: N/A;  . INSERTION OF SUPRAPUBIC CATHETER N/A 07/12/2015   Procedure: INSERTION OF SUPRAPUBIC CATHETER;  Surgeon: [name], MD;  Location: [facility];  Service: Urology;  Laterality: N/A;  . LAPAROSCOPIC ASSISTED LEFT HEMICOLECTOMY  06/  2007   high grade hyperplastic mass  . MASTECTOMY Right 02-28-2006  . PARTIAL KNEE ARTHROPLASTY Right 01/24/2013   Procedure: RIGHT KNEE MEDIAL UNICOMPARTMENTAL ARTHROPLASTY;  Surgeon: Gearlean Alf, MD;  Location: [facility];  Service: Orthopedics;  Laterality: Right;  . PULLEY RELEASE LEFT INDEX AND SMALL FINGER  08-07-2007  . TONSILLECTOMY  age 19    Social History   Social History  . Marital status: Widowed    Spouse name: N/A  . Number of children: 3  . Years of education: N/A   Occupational History  . Not on file.   Social History Main Topics  . Smoking status: Never Smoker  . Smokeless tobacco: Never Used  . Alcohol use Yes     Comment: Very seldom  . Drug use: No  . Sexual activity: Not on file   Other Topics Concern  . Not on  file   Social History Narrative   Children live in [place]. She has 1 son and 2 daughters; brother and nephew live in [place]. Widowed '94    Family History  Problem Relation Age of Onset  . Hypertension Mother   . Stroke Father   . Diabetes Father   . Hyperlipidemia Brother   . Breast cancer Paternal Aunt     dx 74s; deceased early 1s  . Breast cancer Cousin     paternal first cousin  . Colon cancer Cousin     paternal first cousin; age dx 5s  . Hyperlipidemia Son     Outpatient Encounter Prescriptions as of 11/18/2015  Medication Sig Note  . atorvastatin (LIPITOR) 40 MG tablet TAKE 1 TABLET BY MOUTH EVERY EVENING   . Calcium Carbonate-Vitamin D (CALCIUM 600+D) 600-400 MG-UNIT per tablet Take 1 tablet by mouth 2 (two) times daily.    . Cholecalciferol (VITAMIN D3) 2000 UNITS TABS Take 2,000 Units by mouth daily.   Marland Kitchen denosumab (PROLIA) 60 MG/ML SOLN injection Inject 60 mg into the skin See admin instructions. Administer in upper arm, thigh, or abdomen 07/09/2014: yearly  . LANOXIN 125  MCG tablet TAKE 1 TABLET BY MOUTH EVERY DAY.   Marland Kitchen loratadine (CLARITIN) 10 MG tablet Take 10 mg by mouth daily as needed for allergies.    . Multiple Vitamins-Minerals (CENTRUM ADULTS PO) Take 1 tablet by mouth daily.   . Multiple Vitamins-Minerals (ICAPS AREDS 2) CAPS Take 1 capsule by mouth 2 (two) times daily.   . polyethylene glycol (MIRALAX / GLYCOLAX) packet Take 17 g by mouth daily as needed for moderate constipation.  02/23/2015: .  . verapamil (CALAN-SR) 240 MG CR tablet TAKE 1 TABLET(240 MG) BY MOUTH DAILY    No facility-administered encounter medications on file as of 11/18/2015.     Allergies  Allergen Reactions  . Arimidex [Anastrozole] Rash  . Avelox [Moxifloxacin Hcl In Nacl] Nausea Only and Rash  . Bactrim Nausea Only and Rash  . Biaxin [Clarithromycin] Nausea Only and Rash  . Macrobid [Nitrofurantoin] Nausea Only and Rash  . Penicillins Nausea Only and Rash    Has patient had a PCN reaction causing immediate rash, facial/tongue/throat swelling, SOB or lightheadedness with hypotension: no Has patient had a PCN reaction causing severe rash involving mucus membranes or skin necrosis: no Has patient had a PCN reaction that required hospitalization no Has patient had a PCN reaction occurring within the last 10 years no If all of the above answers are "NO", then may proceed with Cephalosporin use.   . Sulfa Antibiotics Nausea And Vomiting and Rash    ROS: The patient denies fever, headaches, vision changes, decreased hearing, ear pain, sore throat, breast concerns, chest pain, palpitations, dizziness, syncope, dyspnea on exertion, cough, swelling, nausea, vomiting, diarrhea, abdominal pain, melena, hematochezia, indigestion/heartburn, hematuria, dysuria, vaginal bleeding, discharge, odor or itch, genital lesions, joint pains (other than knee), numbness, tingling, weakness, tremor, suspicious skin lesions, depression, anxiety, abnormal bleeding/bruising, or enlarged lymph  nodes. Very intermittent dysphagia--feels like a lump if she drinks very cold water. She no longer has problems with taking calcium, as long as she doesn't take it with ice cold water. Constipation --controlled by Miralax.  Has periodic flares of hemorrhoids. Denies bleeding (rare spot on underwear from hemorrhoids) Sleeps well, up once or twice to catheterize herself. Alopecia--stable overall. Some diminished appetite   PHYSICAL EXAM:  BP 120/80 (BP Location: Left Arm, Patient Position: Sitting, Cuff Size: Normal)  Pulse 80   Ht 5\' 2"  (1.575 m)   Wt 121 lb 12.8 oz (55.2 kg)   BMI 22.28 kg/m   She declines breast exam (Dr. [name] did it in June); declines pelvic exam. Declines changing into gown today.  General Appearance:   Alert, cooperative, no distress, appears somewhat younger than stated age  Head:   Normocephalic, without obvious abnormality, atraumatic  Eyes:   PERRL, conjunctiva/corneas clear, EOM's intact, fundi   benign  Ears:   Normal external ear canals and TM's.  Nose:  Nares normal, mucosa normal, no drainage or sinustenderness  Throat:  Lips, mucosa, and tongue normal; teeth and gums normal. +torus pallatini  Neck:  Supple, no lymphadenopathy; thyroid: no enlargement/tenderness/nodules; no carotid  bruit or JVD  Back:  Spine nontender, no curvature, ROM normal, no CVA tenderness  Lungs:   Clear to auscultation bilaterally without wheezes, rales orronchi; respirations unlabored  Chest Wall:   No tenderness or deformity  Heart:   Regular rate and rhythm, S1 and S2 normal, no murmur, rub or gallop  Breast Exam:   Exam declined by patient (deferred to oncologist)  Abdomen:   Soft, non-tender, nondistended, normoactive bowel sounds, no masses, no hepatosplenomegaly. suprapubic catheter in place, tubing taped over lower abdomen.  Very slight reaction to tape in certain locations.  Genitalia:  Declined by patient   Rectal:  Declined by patient  Extremities:  No clubbing, cyanosis or edema. WHSS at right knee. No effusion/swelling.  Pulses:  2+ and symmetric all extremities  Skin:  Skin color, texture, turgor normal, no rashes or lesions  Lymph nodes:  Cervical, supraclavicular, and axillary nodes normal  Neurologic:  CNII-XII intact, normal strength, sensation and gait; reflexes 2+ and symmetric throughout   Psych: Normal mood, affect, hygiene and grooming   ASSESSMENT/PLAN:  Medicare annual wellness visit, subsequent  Mixed hyperlipidemia - not fasting today. TG elevated in the past. Encouraged omega-3 fish oil, lowfat diet. Recheck in 6 mos--come fasting to visit  Palpitations - controlled  Atony of bladder - doing well with suprapubic catheter, with no complications  Essential hypertension, benign - well controlled  Osteoporosis - Discussed calcium, vitamin D in detail, plus weight-bearing exercise.  continue Prolia.   Discussed monthly self breast exams and yearly mammograms; at least 30 minutes of aerobic activity at least 5 days/week, weight-bearing exercise 2x/week; proper sunscreen use reviewed; healthy diet, including goals of calcium and vitamin D intake and alcohol recommendations (less than or equal to 1 drink/day) reviewed; regular seatbelt use; changing batteries in smoke detectors. Immunization recommendations discussed, declines getting flu shot today. Will get in mid-September at the pharmacy (and contact us). Zostavax recommended (previously reviewed risks/benefits and prescription given, but pt declined to get). Colonoscopy recommendations reviewed, UTD.  Patient has Living Will and healthcare POA; full care. Plans to update these with her attorney soon, and get [place] copies.  8 ounces of milk has about 300mg  of calcium (unless you are drinking "fortified" milk, those are usually 500mg /glass. 8 ounces of yogurt is also 300mg  of  calcium--most yogurts are only 4-6 ounces, and Greek yogurt has a little less calcium than plain yogurt. You want to get 1200mg  (at least) of calcium daily every day--between your dietary sources and your vitamins/supplements.  You don't have to take 1200-1500mg  all in calcium pills--subtract out what you get from your diet and make up the difference with a calcium supplement.  Continue to take your vitamin D3 2000 IU every day.  F/u 6 months  for fasting med check  Medicare Attestation I have personally reviewed: The patient's medical and social history Their use of alcohol, tobacco or illicit drugs Their current medications and supplements The patient's functional ability including ADLs,fall risks, home safety risks, cognitive, and hearing and visual impairment Diet and physical activities Evidence for depression or mood disorders  The patient's weight, height, and BMI have been recorded in the chart.  I have made referrals, counseling, and provided education to the patient based on review of the above and I have provided the patient with a written personalized care plan for preventive services.     Jep Dyas A, MD   11/18/15

## 2015-11-18 ENCOUNTER — Ambulatory Visit (INDEPENDENT_AMBULATORY_CARE_PROVIDER_SITE_OTHER): Payer: Medicare Other | Admitting: Family Medicine

## 2015-11-18 ENCOUNTER — Encounter: Payer: Self-pay | Admitting: Family Medicine

## 2015-11-18 VITALS — BP 120/80 | HR 80 | Ht 62.0 in | Wt 121.8 lb

## 2015-11-18 DIAGNOSIS — N312 Flaccid neuropathic bladder, not elsewhere classified: Secondary | ICD-10-CM | POA: Diagnosis not present

## 2015-11-18 DIAGNOSIS — R002 Palpitations: Secondary | ICD-10-CM

## 2015-11-18 DIAGNOSIS — I10 Essential (primary) hypertension: Secondary | ICD-10-CM

## 2015-11-18 DIAGNOSIS — Z0000 Encounter for general adult medical examination without abnormal findings: Secondary | ICD-10-CM

## 2015-11-18 DIAGNOSIS — M25561 Pain in right knee: Secondary | ICD-10-CM | POA: Diagnosis not present

## 2015-11-18 DIAGNOSIS — M810 Age-related osteoporosis without current pathological fracture: Secondary | ICD-10-CM

## 2015-11-18 DIAGNOSIS — E782 Mixed hyperlipidemia: Secondary | ICD-10-CM

## 2015-11-18 NOTE — Patient Instructions (Addendum)
HEALTH MAINTENANCE RECOMMENDATIONS:  It is recommended that you get at least 30 minutes of aerobic exercise at least 5 days/week (for weight loss, you may need as much as 60-90 minutes). This can be any activity that gets your heart rate up. This can be divided in 10-15 minute intervals if needed, but try and build up your endurance at least once a week.  Weight bearing exercise is also recommended twice weekly.  Eat a healthy diet with lots of vegetables, fruits and fiber.  "Colorful" foods have a lot of vitamins (ie green vegetables, tomatoes, red peppers, etc).  Limit sweet tea, regular sodas and alcoholic beverages, all of which has a lot of calories and sugar.  Up to 1 alcoholic drink daily may be beneficial for women (unless trying to lose weight, watch sugars).  Drink a lot of water.  Calcium recommendations are 1200-1500 mg daily (1500 mg for postmenopausal women or women without ovaries), and vitamin D 1000 IU daily.  This should be obtained from diet and/or supplements (vitamins), and calcium should not be taken all at once, but in divided doses.  Monthly self breast exams and yearly mammograms for women over the age of 53 is recommended.  Sunscreen of at least SPF 30 should be used on all sun-exposed parts of the skin when outside between the hours of 10 am and 4 pm (not just when at beach or pool, but even with exercise, golf, tennis, and yard work!)  Use a sunscreen that says "broad spectrum" so it covers both UVA and UVB rays, and make sure to reapply every 1-2 hours.  Remember to change the batteries in your smoke detectors when changing your clock times in the spring and fall.  Use your seat belt every time you are in a car, and please drive safely and not be distracted with cell phones and texting while driving.   Mary [name] , Thank you for taking time to come for your Medicare Wellness Visit. I appreciate your ongoing commitment to your health goals. Please review the following  plan we discussed and let me know if I can assist you in the future.   These are the goals we discussed: Goals    None      This is a list of the screening recommended for you and due dates:  Health Maintenance  Topic Date Due  . Shingles Vaccine  01/07/1989  . Flu Shot  10/19/2015  . Tetanus Vaccine  09/03/2021  . DEXA scan (bone density measurement)  Completed  . Pneumonia vaccines  Completed   Shingles vaccine is recommended (optional)--if you are interested in getting this vaccine, you have to get it from the pharmacy, not our office.  It needs to be separated from other vaccines by a month (or given at the same time).  You elected to wait until mid September to get the flu shot from the pharmacy.  Please let us know when you have it so we can put this information in your chart.  You will likely need another bone density test in 2018 (2 years from the last).  Continue to get your mammograms yearly, due again in October.  8 ounces of milk has about 300mg  of calcium (unless you are drinking "fortified" milk, those are usually 500mg /glass. 8 ounces of yogurt is also 300mg  of calcium--most yogurts are only 4-6 ounces, and Greek yogurt has a little less calcium than plain yogurt. You want to get 1200mg  (at least) of calcium daily every day--between  your dietary sources and your vitamins/supplements.  You don't have to take 1200-1500mg  all in calcium pills--subtract out what you get from your diet and make up the difference with a calcium supplement.  Continue to take your vitamin D3 2000 IU every day.

## 2015-11-23 DIAGNOSIS — H353110 Nonexudative age-related macular degeneration, right eye, stage unspecified: Secondary | ICD-10-CM | POA: Diagnosis not present

## 2015-11-23 DIAGNOSIS — H40013 Open angle with borderline findings, low risk, bilateral: Secondary | ICD-10-CM | POA: Diagnosis not present

## 2015-11-23 DIAGNOSIS — H5213 Myopia, bilateral: Secondary | ICD-10-CM | POA: Diagnosis not present

## 2015-11-23 DIAGNOSIS — H26493 Other secondary cataract, bilateral: Secondary | ICD-10-CM | POA: Diagnosis not present

## 2015-11-24 DIAGNOSIS — M25561 Pain in right knee: Secondary | ICD-10-CM | POA: Diagnosis not present

## 2015-11-25 DIAGNOSIS — N312 Flaccid neuropathic bladder, not elsewhere classified: Secondary | ICD-10-CM | POA: Diagnosis not present

## 2015-11-26 DIAGNOSIS — H6123 Impacted cerumen, bilateral: Secondary | ICD-10-CM | POA: Insufficient documentation

## 2015-11-26 DIAGNOSIS — M25561 Pain in right knee: Secondary | ICD-10-CM | POA: Diagnosis not present

## 2015-11-29 DIAGNOSIS — Z23 Encounter for immunization: Secondary | ICD-10-CM | POA: Diagnosis not present

## 2015-11-30 DIAGNOSIS — M25561 Pain in right knee: Secondary | ICD-10-CM | POA: Diagnosis not present

## 2015-12-03 DIAGNOSIS — M25561 Pain in right knee: Secondary | ICD-10-CM | POA: Diagnosis not present

## 2015-12-09 DIAGNOSIS — H93292 Other abnormal auditory perceptions, left ear: Secondary | ICD-10-CM | POA: Insufficient documentation

## 2015-12-09 DIAGNOSIS — H903 Sensorineural hearing loss, bilateral: Secondary | ICD-10-CM | POA: Diagnosis not present

## 2015-12-14 DIAGNOSIS — M25561 Pain in right knee: Secondary | ICD-10-CM | POA: Diagnosis not present

## 2015-12-16 DIAGNOSIS — M25561 Pain in right knee: Secondary | ICD-10-CM | POA: Diagnosis not present

## 2015-12-21 ENCOUNTER — Other Ambulatory Visit: Payer: Self-pay | Admitting: Family Medicine

## 2015-12-22 DIAGNOSIS — N312 Flaccid neuropathic bladder, not elsewhere classified: Secondary | ICD-10-CM | POA: Diagnosis not present

## 2015-12-22 DIAGNOSIS — N3020 Other chronic cystitis without hematuria: Secondary | ICD-10-CM | POA: Diagnosis not present

## 2016-01-13 DIAGNOSIS — Z96651 Presence of right artificial knee joint: Secondary | ICD-10-CM | POA: Diagnosis not present

## 2016-01-13 DIAGNOSIS — Z471 Aftercare following joint replacement surgery: Secondary | ICD-10-CM | POA: Diagnosis not present

## 2016-01-18 ENCOUNTER — Other Ambulatory Visit: Payer: Self-pay | Admitting: Family Medicine

## 2016-01-24 DIAGNOSIS — Z1231 Encounter for screening mammogram for malignant neoplasm of breast: Secondary | ICD-10-CM | POA: Diagnosis not present

## 2016-01-24 DIAGNOSIS — Z853 Personal history of malignant neoplasm of breast: Secondary | ICD-10-CM | POA: Diagnosis not present

## 2016-01-24 LAB — HM MAMMOGRAPHY

## 2016-01-25 ENCOUNTER — Encounter: Payer: Self-pay | Admitting: Family Medicine

## 2016-01-25 DIAGNOSIS — N312 Flaccid neuropathic bladder, not elsewhere classified: Secondary | ICD-10-CM | POA: Diagnosis not present

## 2016-01-27 ENCOUNTER — Encounter: Payer: Self-pay | Admitting: Family Medicine

## 2016-01-27 DIAGNOSIS — R922 Inconclusive mammogram: Secondary | ICD-10-CM | POA: Diagnosis not present

## 2016-01-27 DIAGNOSIS — R928 Other abnormal and inconclusive findings on diagnostic imaging of breast: Secondary | ICD-10-CM | POA: Diagnosis not present

## 2016-01-27 LAB — HM MAMMOGRAPHY

## 2016-02-05 ENCOUNTER — Other Ambulatory Visit: Payer: Self-pay | Admitting: Family Medicine

## 2016-02-05 DIAGNOSIS — K644 Residual hemorrhoidal skin tags: Secondary | ICD-10-CM

## 2016-02-07 NOTE — Telephone Encounter (Signed)
Is this okay to refill? 

## 2016-02-13 ENCOUNTER — Other Ambulatory Visit: Payer: Self-pay | Admitting: Family Medicine

## 2016-02-21 ENCOUNTER — Other Ambulatory Visit: Payer: Self-pay | Admitting: Family Medicine

## 2016-02-21 NOTE — Telephone Encounter (Signed)
Ok to refill #30 (remember it is DAW)

## 2016-02-21 NOTE — Telephone Encounter (Signed)
Is this okay to refill? 

## 2016-02-24 DIAGNOSIS — N312 Flaccid neuropathic bladder, not elsewhere classified: Secondary | ICD-10-CM | POA: Diagnosis not present

## 2016-03-23 DIAGNOSIS — N312 Flaccid neuropathic bladder, not elsewhere classified: Secondary | ICD-10-CM | POA: Diagnosis not present

## 2016-04-10 ENCOUNTER — Telehealth: Payer: Self-pay | Admitting: Family Medicine

## 2016-04-10 ENCOUNTER — Ambulatory Visit (INDEPENDENT_AMBULATORY_CARE_PROVIDER_SITE_OTHER): Payer: Medicare Other | Admitting: Family Medicine

## 2016-04-10 ENCOUNTER — Encounter: Payer: Self-pay | Admitting: Family Medicine

## 2016-04-10 VITALS — BP 138/80 | HR 80 | Temp 100.0°F | Ht 62.0 in | Wt 123.4 lb

## 2016-04-10 DIAGNOSIS — R109 Unspecified abdominal pain: Secondary | ICD-10-CM

## 2016-04-10 DIAGNOSIS — T83510A Infection and inflammatory reaction due to cystostomy catheter, initial encounter: Secondary | ICD-10-CM | POA: Diagnosis not present

## 2016-04-10 DIAGNOSIS — R509 Fever, unspecified: Secondary | ICD-10-CM

## 2016-04-10 DIAGNOSIS — N390 Urinary tract infection, site not specified: Secondary | ICD-10-CM | POA: Diagnosis not present

## 2016-04-10 DIAGNOSIS — R52 Pain, unspecified: Secondary | ICD-10-CM

## 2016-04-10 DIAGNOSIS — K5900 Constipation, unspecified: Secondary | ICD-10-CM | POA: Diagnosis not present

## 2016-04-10 LAB — POCT URINALYSIS DIPSTICK
Bilirubin, UA: NEGATIVE
Glucose, UA: NEGATIVE
KETONES UA: NEGATIVE
NITRITE UA: NEGATIVE
PH UA: 6
Spec Grav, UA: 1.015
UROBILINOGEN UA: NEGATIVE

## 2016-04-10 LAB — POC INFLUENZA A&B (BINAX/QUICKVUE)
INFLUENZA B, POC: NEGATIVE
Influenza A, POC: NEGATIVE

## 2016-04-10 MED ORDER — CIPROFLOXACIN HCL 250 MG PO TABS: 250.0000 mg | ORAL_TABLET | Freq: Two times a day (BID) | ORAL | 0 refills | Status: DC

## 2016-04-10 NOTE — Telephone Encounter (Signed)
Pt called she is achy and nausea.  Entire body aches.  She wants to know if you will call in Tamiflu for her to [facility] on [address],.  Pt ph [phone]

## 2016-04-10 NOTE — Progress Notes (Signed)
Chief Complaint  Patient presents with  . Generalized Body Aches    woke up yesterday morning with legs aching and abdominal pain (which she thinks is because she has not had a bowel movement in a few days). Felt a little a nasueous yesterday, somewhat better today. Took two ES tylenol yesterday and this morning and has made her feel much better. No coughing, no URI symptoms, doesn't think she has a temp-no chills.     Yesterday morning she awoke with some decreased appetite, and some aching in her body, arms, legs.  "Just didn't feel good" yesterday. Today she felt worse--during the night she started having lower abdominal pain, more on the left side.  She felt dehydrated. She was drinking a lot of Coke--felt thirsty, and it helped with her nausea. Admits she hasn't been drinking as much water as she is supposed to.  She has been constipated, last good movement was 2-3 days ago.  Having some small hard balls pass only.  She plans to take a laxative this evening. Denies blood or mucus in stool.  Denies h/o diverticulitis.  She doesn't have a thermometer at home.  She felt a little chilled this morning. No known fever.  Denies any URI symptoms, sore throat, cough.    Took extra strength tylenol at 10 this morning, and legs feel better.  Has a chronic suprapubic catheter, which is changed every month, due next week. She noted that the urine was cloudy this morning, whereas it is usually clear.  She denies odor. She has been having urinary frequency while in the office--she was drinking a lot of water, thinking she was dehydrated; doesn't empty bladder well--not necessarily new/different for her.  She reports she had very little fluid in the bag from 8 through 1 pm compared to normal. Denies flank pain.  PMH, PSH, SH reviewed  Outpatient Encounter Prescriptions as of 04/10/2016  Medication Sig Note  . acetaminophen (TYLENOL) 500 MG tablet Take 1,000 mg by mouth every 6 (six) hours as needed.   Marland Kitchen  atorvastatin (LIPITOR) 40 MG tablet TAKE 1 TABLET BY MOUTH EVERY EVENING   . Calcium Carbonate-Vitamin D (CALCIUM 600+D) 600-400 MG-UNIT per tablet Take 1 tablet by mouth 2 (two) times daily.    . Cholecalciferol (VITAMIN D3) 2000 UNITS TABS Take 2,000 Units by mouth daily.   Marland Kitchen denosumab (PROLIA) 60 MG/ML SOLN injection Inject 60 mg into the skin See admin instructions. Administer in upper arm, thigh, or abdomen 07/09/2014: yearly  . LANOXIN 125 MCG tablet TAKE 1 TABLET BY MOUTH EVERY DAY.   . Multiple Vitamins-Minerals (CENTRUM ADULTS PO) Take 1 tablet by mouth daily.   . Multiple Vitamins-Minerals (ICAPS AREDS 2) CAPS Take 1 capsule by mouth 2 (two) times daily.   . verapamil (CALAN-SR) 240 MG CR tablet TAKE 1 TABLET(240 MG) BY MOUTH DAILY   . loratadine (CLARITIN) 10 MG tablet Take 10 mg by mouth daily as needed for allergies.    . polyethylene glycol (MIRALAX / GLYCOLAX) packet Take 17 g by mouth daily as needed for moderate constipation.  02/23/2015: .  Marland Kitchen PROCTOZONE-HC 2.5 % rectal cream APPLY EXTERNALLY TO THE AFFECTED AREA TWICE DAILY   . VALIUM 2 MG tablet TAKE 1 TABLET BY MOUTH EVERY 8 HOURS FOR MUSCLE SPASMS OR ANXIETY (Patient not taking: Reported on 04/10/2016)    No facility-administered encounter medications on file as of 04/10/2016.    Allergies  Allergen Reactions  . Arimidex [Anastrozole] Rash  . Avelox [Moxifloxacin Hcl  In Nacl] Nausea Only and Rash  . Bactrim Nausea Only and Rash  . Biaxin [Clarithromycin] Nausea Only and Rash  . Macrobid [Nitrofurantoin] Nausea Only and Rash  . Penicillins Nausea Only and Rash    Has patient had a PCN reaction causing immediate rash, facial/tongue/throat swelling, SOB or lightheadedness with hypotension: no Has patient had a PCN reaction causing severe rash involving mucus membranes or skin necrosis: no Has patient had a PCN reaction that required hospitalization no Has patient had a PCN reaction occurring within the last 10 years no If all  of the above answers are "NO", then may proceed with Cephalosporin use.   . Sulfa Antibiotics Nausea And Vomiting and Rash   ROS: slight chills this morning, no known fever.  No URI symptoms, headache, dizziness, chest pain, palpitations, cough, shortness of breath.  Some decrease in appetite and mild nausea.  No vomiting, heartburn.  +constipation and LLQ pain. +body aches (resolved with Tylenol).  No bleeding, bruising, rash or other complaints except as noted above.  PHYSICAL EXAM:  BP 138/80 (BP Location: Left Arm, Patient Position: Sitting, Cuff Size: Normal)   Pulse 80   Temp 100 F (37.8 C) (Tympanic)   Ht 5\' 2"  (1.575 m)   Wt 123 lb 6.4 oz (56 kg)   BMI 22.57 kg/m   Well appearing elderly female, in good spirits, in no distress HEENT: conjunctiva and sclera are clear, TM's and EAC's normal. Nasal mucosa is normal, OP is clear, sinuses nontender Neck: no lymphadenopathy, thyromegaly or mass Heart: regular rate and rhythm without murmur Lungs: clear bilaterally Back: no CVA tenderness Abdomen: soft, normal bowel sounds.  Suprapubic catheter in place.  Slight skin irritation from tape. Bladder nontender (but full--emptied through catheter later in visit); tender at LLQ, no mass, rebound or guarding. Extremities: no edema Neuro: alert and oriented, normal gait, cranial nerves Psych: normal mood, affect, hygiene and grooming Skin: normal turgor.  Slight rash on abdomen (related to tape used over catheter).   Urine dip: 2+ blood, trace protein, 3+ leuks (voided, not from catheter). Influenza A+B negative   ASSESSMENT/PLAN:  Fever, unspecified fever cause - abnormal urine dip (voided, not from cath); LLQ pain. Treat for UTI, consider diverticulosis if pain doesn't respond to laxative - Plan: POC Influenza A&B (Binax test)  Body aches - Plan: POC Influenza A&B (Binax test)  Abdominal pain, unspecified abdominal location - LLQ--suspect related to constipation; cannot r/o  diverticulitis. given abnormal u/a, will treat for UTI - Plan: POCT Urinalysis Dipstick  Urinary tract infection associated with cystostomy catheter, initial encounter ([place]) - Plan: ciprofloxacin (CIPRO) 250 MG tablet, Urine culture  Constipation, unspecified constipation type - has some chronic symptoms, uses laxatives prn. Will use today; f/u if worsening pain despite good result   Fever and LLQ pain, with abnormal urine dip, constipation. Treat for UTI (has indwelling suprapubic cath)--urine sent for culture. May need catheter changed sooner. Pt to drink plenty of water, cut back on caffeinated soda. We will send results to Dr. [name]. Concern for possible diverticulitis.  If abdominal pain doesn't respond to treatment of constipation, or if ongoing pain and fever, may need further eval (labs, CT).  Pt encouraged to f/u in 2-3 days if not improving.  Patient with many antibiotic allergies listed in chart.  Pretty much all but cephalosporins.  Patient states she has tolerated cipro without a problem (but may have been post-procedure and not a full course, according to pt). She is willing to re-try.  Given lack of flank pain, will use lower dose (250mg  BID x 7d).   Send results to Dr. [name]

## 2016-04-10 NOTE — Patient Instructions (Signed)
Please continue to drink plenty of fluid. Use tylenol as needed for fever or pain. There is no evidence of influenza.  Let us know if you develop cough or upper respiratory symptoms. Your urine was abnormal.  We are sending it for culture and going to treat you for infection while awaiting the culture results. You report that you have taken cipro without a problem.  We hae a documented allergy to a medication within the same class--if you develop any rash or reaction, please stop the cipro right away.  We will send the culture results to Dr. [name] when available, and have you follow-up with him if it confirms an infection.  If the urine culture shows NO infection, we will have you stop the antibiotic.  Your left sided pain may be related to constipation.  If the fever is coming from the bowels, rather than the bladder, it is possible that you have an infection (called diverticulitis).  It is important that you if you have worsening left sided abdominal pain, or ongoing fevers, that you seek care right away--you may need to have CT scan and different antibiotics.  I'm hoping that you get relief of this discomfort after having a good bowel movement.  We will be in touch with your results.  You should plan on returning later this week (in 2-3 days) if not getting significantly better, for re-evaluation.

## 2016-04-10 NOTE — Telephone Encounter (Signed)
The criteria we should be asking patients for in regard to the flu should include whether or not they have fever, cough, sore throat, and also when was onset of symptoms.  There is no mention of these symptoms.    Also, I have plenty of openings, so OV should be offered

## 2016-04-10 NOTE — Telephone Encounter (Signed)
Spoke with pt- she was made aware need for OV, OV scheduled. Mary [name]

## 2016-04-12 LAB — URINE CULTURE: Colony Count: >=100000

## 2016-04-14 DIAGNOSIS — N312 Flaccid neuropathic bladder, not elsewhere classified: Secondary | ICD-10-CM | POA: Diagnosis not present

## 2016-04-17 ENCOUNTER — Other Ambulatory Visit: Payer: Self-pay | Admitting: Family Medicine

## 2016-04-21 ENCOUNTER — Ambulatory Visit (INDEPENDENT_AMBULATORY_CARE_PROVIDER_SITE_OTHER): Payer: Medicare Other | Admitting: Family Medicine

## 2016-04-21 ENCOUNTER — Encounter: Payer: Self-pay | Admitting: Family Medicine

## 2016-04-21 VITALS — BP 130/80 | HR 84 | Temp 97.6°F | Wt 122.2 lb

## 2016-04-21 DIAGNOSIS — R05 Cough: Secondary | ICD-10-CM | POA: Diagnosis not present

## 2016-04-21 DIAGNOSIS — R059 Cough, unspecified: Secondary | ICD-10-CM

## 2016-04-21 LAB — POC INFLUENZA A&B (BINAX/QUICKVUE)
Influenza A, POC: NEGATIVE
Influenza B, POC: NEGATIVE

## 2016-04-21 NOTE — Progress Notes (Signed)
Subjective: Chief Complaint  Patient presents with  . bad cough    bad cough, raspy throat- started last night, 2 weeks ago tested negative for flu and was stomach virus     Mary [name] is a 83 y.o. female who presents for a scratchy throat and dry cough that started in the middle of the night. States she would feel better if we would test her and she knew she did not have the flu since the weekend is beginning.   Denies fever, chills, body aches, rhinorrhea, nasal congestion, ear pain, dizziness, chest pain, palpitations, shortness of breath, wheezing, abdominal pain, back pain, N/V/D, LE edema.   States she was seen for a GI bug and took Cipro and symptoms resolved.   Treatment to date: none.  Denies sick contacts.  No other aggravating or relieving factors.  No other c/o.  ROS as in subjective.   Objective: Vitals:   04/21/16 1129  BP: 130/80  Pulse: 84  Temp: 97.6 F (36.4 C)    General appearance: Alert, WD/WN, younger than state age, no distress, is not ill appearing                             Skin: warm, no rash                           Head: no sinus tenderness                            Eyes: conjunctiva normal, corneas clear, PERRLA                            Ears: pearly TMs, external ear canals normal                          Nose: septum midline, turbinates swollen (on right side), with erythema no discharge             Mouth/throat: MMM, tongue normal, mild pharyngeal erythema                           Neck: supple, no adenopathy, no thyromegaly, nontender                          Heart: RRR, normal S1, S2, no murmurs                         Lungs: CTA bilaterally, no wheezes, rales, or rhonchi      Assessment: Cough - Plan: POC Influenza A&B(BINAX/QUICKVUE)  Plan: Flu swab is negative.  Patient seems very concerned that she could be getting the flu. Reassured her that symptoms and negative flu swab speak to this Suggested symptomatic OTC remedies and  staying well hydrated. She is taking Claritin daily. May use salt water gargles for throat discomfort.  Nasal saline spray for congestion.  Tylenol for fever and malaise.  Call/return in 2-3 days if symptoms aren't resolving. Made her aware that we do have an after hours number in case she has concerns.

## 2016-04-25 ENCOUNTER — Encounter: Payer: Self-pay | Admitting: Family Medicine

## 2016-04-25 ENCOUNTER — Ambulatory Visit (INDEPENDENT_AMBULATORY_CARE_PROVIDER_SITE_OTHER): Payer: Medicare Other | Admitting: Family Medicine

## 2016-04-25 VITALS — BP 118/74 | HR 87 | Temp 98.5°F | Wt 119.6 lb

## 2016-04-25 DIAGNOSIS — J209 Acute bronchitis, unspecified: Secondary | ICD-10-CM | POA: Diagnosis not present

## 2016-04-25 MED ORDER — AZITHROMYCIN 500 MG PO TABS: 500.0000 mg | ORAL_TABLET | Freq: Every day | ORAL | 0 refills | Status: DC

## 2016-04-25 NOTE — Progress Notes (Signed)
   Subjective:    Patient ID: Mary [name], female    DOB: 11-Jun-1926, 83 y.o.   MRN: [id number]  HPI She is here because of continued difficulty with cough that has become productive. No fever, chills, sore throat, earache.   Review of Systems     Objective:   Physical Exam Alert and in no distress, not toxic. Tympanic membranes and canals are normal. Pharyngeal area is normal. Neck is supple without adenopathy or thyromegaly. Cardiac exam shows a regular sinus rhythm without murmurs or gallops. Lungs are clear to auscultation.        Assessment & Plan:  Acute bronchitis, unspecified organism - Plan: azithromycin (ZITHROMAX) 500 MG tablet She has had adverse reactions with macrolides in the past but hopefully Zithromax will not causing much trouble. Recommend Robitussin-DM during the day and NyQuil at night. Explained that I thought she had a secondary bacterial infection and think it would be prudent to put her on an antibiotic.

## 2016-04-25 NOTE — Patient Instructions (Signed)
Take the pill for the next 3 days but the effect of the medicine lasts a week so. Still having trouble in a week call us. Take Robitussin-DM help with the coughing and if you want you can take NyQuil at night

## 2016-04-26 ENCOUNTER — Telehealth: Payer: Self-pay | Admitting: *Deleted

## 2016-04-26 MED ORDER — PROMETHAZINE HCL 25 MG RE SUPP: 25.0000 mg | Freq: Three times a day (TID) | RECTAL | 0 refills | Status: DC | PRN

## 2016-04-26 NOTE — Telephone Encounter (Signed)
Patient called and she saw Dr.Lalonde yesterday and was given azithromycin. She has been nauseous and not even able to eat which is making her even more nauseous. (not from the meds-was already nauseous prior to starting) wanting to know if you could call in suppository for nausea?

## 2016-04-26 NOTE — Telephone Encounter (Signed)
Patient advised.

## 2016-04-26 NOTE — Telephone Encounter (Signed)
Advise pt that phenergan suppository was called in.  May cause sedation--use caution with driving

## 2016-05-01 ENCOUNTER — Ambulatory Visit (INDEPENDENT_AMBULATORY_CARE_PROVIDER_SITE_OTHER): Payer: Medicare Other | Admitting: Family Medicine

## 2016-05-01 ENCOUNTER — Encounter: Payer: Self-pay | Admitting: Family Medicine

## 2016-05-01 VITALS — BP 150/82 | HR 88 | Temp 99.1°F | Ht 62.0 in | Wt 116.6 lb

## 2016-05-01 DIAGNOSIS — R110 Nausea: Secondary | ICD-10-CM | POA: Diagnosis not present

## 2016-05-01 DIAGNOSIS — R05 Cough: Secondary | ICD-10-CM

## 2016-05-01 DIAGNOSIS — J40 Bronchitis, not specified as acute or chronic: Secondary | ICD-10-CM | POA: Diagnosis not present

## 2016-05-01 DIAGNOSIS — R059 Cough, unspecified: Secondary | ICD-10-CM

## 2016-05-01 MED ORDER — BENZONATATE 200 MG PO CAPS: 200.0000 mg | ORAL_CAPSULE | Freq: Three times a day (TID) | ORAL | 0 refills | Status: DC | PRN

## 2016-05-01 MED ORDER — ONDANSETRON 4 MG PO TBDP: 4.0000 mg | ORAL_TABLET | Freq: Three times a day (TID) | ORAL | 0 refills | Status: DC | PRN

## 2016-05-01 NOTE — Progress Notes (Signed)
Chief Complaint  Patient presents with  . Cough    still coughing. Has a lot of nausea. Used suppositories x 2 at night only for fear that she would be too sleepy during the day to try. Mucus is clear but lots of it.    Saw Vickie on 2/2 with cough. Flu test was negative. She returned on 2/6 and saw Dr. Redmond School and was diagnosed with bronchitis.  She was given 3d of azithromycin 500mg , which she took Wed through Friday of last week.  She continues to complain of nausea.  If she coughs hard she almost vomits, but hasn't vomited.  She hasn't been eating solid foods, but staying well hydrated. She has been using the phenergan suppositories--one 2 nights ago, and one 3 nights ago--not using it during the day for fear of side effects.  She continues to cough a lot, worse at night, but also coughs a lot during the day.  The mucus is now clear, originally it was discolored.  But she has not seen improvement in the amount of coughing. Denies shortness of breath.  She had taken Robitussin DM, which helped some, but stopped that when she started the antibiotic  PMH, PSH, SH reviewed  Outpatient Encounter Prescriptions as of 05/01/2016  Medication Sig Note  . atorvastatin (LIPITOR) 40 MG tablet TAKE 1 TABLET BY MOUTH EVERY EVENING   . Calcium Carbonate-Vitamin D (CALCIUM 600+D) 600-400 MG-UNIT per tablet Take 1 tablet by mouth 2 (two) times daily.    . Cholecalciferol (VITAMIN D3) 2000 UNITS TABS Take 2,000 Units by mouth daily.   Marland Kitchen denosumab (PROLIA) 60 MG/ML SOLN injection Inject 60 mg into the skin See admin instructions. Administer in upper arm, thigh, or abdomen 07/09/2014: yearly  . LANOXIN 125 MCG tablet TAKE 1 TABLET BY MOUTH EVERY DAY.   Marland Kitchen loratadine (CLARITIN) 10 MG tablet Take 10 mg by mouth daily as needed for allergies.    . Multiple Vitamins-Minerals (CENTRUM ADULTS PO) Take 1 tablet by mouth daily.   . Multiple Vitamins-Minerals (ICAPS AREDS 2) CAPS Take 1 capsule by mouth 2 (two) times  daily.   Marland Kitchen PROCTOZONE-HC 2.5 % rectal cream APPLY EXTERNALLY TO THE AFFECTED AREA TWICE DAILY   . promethazine (PHENERGAN) 25 MG suppository Place 1 suppository (25 mg total) rectally every 8 (eight) hours as needed for nausea or vomiting. 05/01/2016: Last used Friday night  . verapamil (CALAN-SR) 240 MG CR tablet TAKE 1 TABLET(240 MG) BY MOUTH DAILY   . acetaminophen (TYLENOL) 500 MG tablet Take 1,000 mg by mouth every 6 (six) hours as needed.   . polyethylene glycol (MIRALAX / GLYCOLAX) packet Take 17 g by mouth daily as needed for moderate constipation.  02/23/2015: .  Marland Kitchen VALIUM 2 MG tablet TAKE 1 TABLET BY MOUTH EVERY 8 HOURS FOR MUSCLE SPASMS OR ANXIETY (Patient not taking: Reported on 05/01/2016)   . [DISCONTINUED] azithromycin (ZITHROMAX) 500 MG tablet Take 1 tablet (500 mg total) by mouth daily.    No facility-administered encounter medications on file as of 05/01/2016.    Allergies  Allergen Reactions  . Arimidex [Anastrozole] Rash  . Avelox [Moxifloxacin Hcl In Nacl] Nausea Only and Rash  . Bactrim Nausea Only and Rash  . Biaxin [Clarithromycin] Nausea Only and Rash  . Macrobid [Nitrofurantoin] Nausea Only and Rash  . Penicillins Nausea Only and Rash    Has patient had a PCN reaction causing immediate rash, facial/tongue/throat swelling, SOB or lightheadedness with hypotension: no Has patient had a PCN reaction  causing severe rash involving mucus membranes or skin necrosis: no Has patient had a PCN reaction that required hospitalization no Has patient had a PCN reaction occurring within the last 10 years no If all of the above answers are "NO", then may proceed with Cephalosporin use.   . Sulfa Antibiotics Nausea And Vomiting and Rash   ROS:  Denies any rash. No recent fevers. +nausea. No vomiting or diarrhea.  No urinary complaints (just poor emptying and frequency, per her baseline).  +cough, dry or productive of clear-white mucus, no longer discolored.  PHYSICAL EXAM:  BP (!)  150/82 (BP Location: Left Arm, Patient Position: Sitting, Cuff Size: Normal)   Pulse 88   Temp 99.1 F (37.3 C) (Tympanic)   Ht 5\' 2"  ([id number] m)   Wt 116 lb 9.6 oz (52.9 kg)   BMI 21.33 kg/m   Pleasant, elderly female, speaking easily in full sentences. Dry cough during visit, frequent HEENT: PERRL, EOMI, conjunctiva and sclera are clear.  Nasal mucosa is normal. OP is clear. Sinuses nontender Neck: no lymphadenopathy or mass Heart: regular rate and rhythm Lungs clear bilaterally, no wheezes, rales, ronchi Abdomen: soft, nontender Extremities: no edema Skin; normal turgor, no rash Psych: normal mood, affect, hygiene and grooming  ASSESSMENT/PLAN:  Bronchitis - resolving, s/p 3d course of azithromycin; improved in discolored phlegm; persistent cough, and nausea as side effect  Nausea - suspect a side effects from azithromycin and should start to improve soon. zofran prn - Plan: ondansetron (ZOFRAN ODT) 4 MG disintegrating tablet  Cough - Plan: benzonatate (TESSALON) 200 MG capsule   Continue to drink plenty of fluids. I believe that your nausea is likely an ongoing side effect from the antibiotics you took last week. Let's try ondansetron to see if that helps with your nausea.  You may use this instead of the suppository (vs using the suppository at night, and this during the day, if needed).  I'm prescribing benzonatate which is a cough medication.  Take it up to three times daily for cough.  If this doesn't work well for you, you may switch back to what worked before (I think you mentioned either robitussin DM or Mucinex DM).  Return if you develop recurrent fever, worsening cough, shortness of breath, discolored mucus/phlegm or any other concerns.

## 2016-05-01 NOTE — Patient Instructions (Signed)
  Continue to drink plenty of fluids. I believe that your nausea is likely an ongoing side effect from the antibiotics you took last week. Let's try ondansetron to see if that helps with your nausea.  You may use this instead of the suppository (vs using the suppository at night, and this during the day, if needed).  I'm prescribing benzonatate which is a cough medication.  Take it up to three times daily for cough.  If this doesn't work well for you, you may switch back to what worked before (I think you mentioned either robitussin DM or Mucinex DM).  Return if you develop recurrent fever, worsening cough, shortness of breath, discolored mucus/phlegm or any other concerns.

## 2016-05-11 DIAGNOSIS — N312 Flaccid neuropathic bladder, not elsewhere classified: Secondary | ICD-10-CM | POA: Diagnosis not present

## 2016-05-24 NOTE — Progress Notes (Signed)
Chief Complaint  Patient presents with  . Hypertension    fasting med check. Needs to discuss Lanoxin and insurance.   Seen last month with bronchitis. Cough resolved--she reports the tessalon didn't really help much.  She has also had some cough related to allergies, and claritin has been helping. Wants it noted in her chart that the azithromycin made her sick and doesn't want it again.  Hypertension and palpitation follow-up: Blood pressures are not checked elsewhere. Denies dizziness, headaches, chest pain. Denies side effects of medications. Palpitations are well controlled.  Lanoxin cost went up to $456 for 90d supply. Only had generic digoxin when in the hospital, not for a very long time.  She has been on granded Lanoxin for 30 years and is very concerned about changing to generic.  Wants [place] to write a letter to get it approved.  She had physical therapy in August/September for R leg/knee pain. She had an injection on the side of the knee in August by Dr. Anne Fu PA. She still has discomfort, but it is not too bad. She rarely takes and advil and it works well.  Hyperlipidemia follow-up: Patient is reportedly following a low cholesterol diet. Compliant with medications and denies medication side effects.She is no longer taking fish oil, as she had a hard time swallowing it, and seems similar to her AREDs. She continues to have 1-2 [place] peppermint patties each day. Last TG was elevated at 250.   Appetite hasn't been as good since her illness. She occasionally has fried chicken. She is fasting for labs today. Lab Results  Component Value Date   CHOL 204 (H) 04/26/2015   HDL 59 04/26/2015   LDLCALC 95 04/26/2015   TRIG 250 (H) 04/26/2015   CHOLHDL 3.5 04/26/2015   Urinary retention--she has suprapubic catheter. She catheterizes at least 5-6 times/daily. Denies pain, abnormal urine. Denies cloudy or bloody urine, no odor. She sees urologist once a month for catheter changes.  She was  started on Prolia injections by Dr. [name] in July 2014--she is getting injection just once a year (she reports she was given a choice of having it once or twice a year). Last injection was 10/2015.  She is taking calcium and vitamin D. Not getting any weight-bearing exercise. DEXA last done 01/2015, showing osteoporosis.  PMH, PSH, SH reviewed  Outpatient Encounter Prescriptions as of 05/25/2016  Medication Sig Note  . atorvastatin (LIPITOR) 40 MG tablet TAKE 1 TABLET BY MOUTH EVERY EVENING   . Calcium Carbonate-Vitamin D (CALCIUM 600+D) 600-400 MG-UNIT per tablet Take 1 tablet by mouth 2 (two) times daily.    . Cholecalciferol (VITAMIN D3) 2000 UNITS TABS Take 2,000 Units by mouth daily.   Marland Kitchen denosumab (PROLIA) 60 MG/ML SOLN injection Inject 60 mg into the skin See admin instructions. Administer in upper arm, thigh, or abdomen 07/09/2014: yearly  . LANOXIN 125 MCG tablet TAKE 1 TABLET BY MOUTH EVERY DAY.   Marland Kitchen loratadine (CLARITIN) 10 MG tablet Take 10 mg by mouth daily as needed for allergies.    . Multiple Vitamins-Minerals (CENTRUM ADULTS PO) Take 1 tablet by mouth daily.   . Multiple Vitamins-Minerals (ICAPS AREDS 2) CAPS Take 1 capsule by mouth 2 (two) times daily.   . polyethylene glycol (MIRALAX / GLYCOLAX) packet Take 17 g by mouth daily as needed for moderate constipation.  05/25/2016: Takes prn, reports she "should be" taking it daily, but forgets  . PROCTOZONE-HC 2.5 % rectal cream APPLY EXTERNALLY TO THE AFFECTED AREA TWICE DAILY   .  verapamil (CALAN-SR) 240 MG CR tablet TAKE 1 TABLET(240 MG) BY MOUTH DAILY   . acetaminophen (TYLENOL) 500 MG tablet Take 1,000 mg by mouth every 6 (six) hours as needed.   . digoxin (LANOXIN) 0.125 MG tablet Take 1 tablet (125 mcg total) by mouth daily.   . ondansetron (ZOFRAN ODT) 4 MG disintegrating tablet Take 1 tablet (4 mg total) by mouth every 8 (eight) hours as needed for nausea or vomiting. (Patient not taking: Reported on 05/25/2016)   . promethazine  (PHENERGAN) 25 MG suppository Place 1 suppository (25 mg total) rectally every 8 (eight) hours as needed for nausea or vomiting. (Patient not taking: Reported on 05/25/2016)   . VALIUM 2 MG tablet TAKE 1 TABLET BY MOUTH EVERY 8 HOURS FOR MUSCLE SPASMS OR ANXIETY (Patient not taking: Reported on 05/01/2016)   . [DISCONTINUED] benzonatate (TESSALON) 200 MG capsule Take 1 capsule (200 mg total) by mouth 3 (three) times daily as needed.    No facility-administered encounter medications on file as of 05/25/2016.    (generic digoxin rx written today, not prior to visit).  Allergies  Allergen Reactions  . Azithromycin Nausea Only  . Arimidex [Anastrozole] Rash  . Avelox [Moxifloxacin Hcl In Nacl] Nausea Only and Rash  . Bactrim Nausea Only and Rash  . Biaxin [Clarithromycin] Nausea Only and Rash  . Macrobid [Nitrofurantoin] Nausea Only and Rash  . Penicillins Nausea Only and Rash    Has patient had a PCN reaction causing immediate rash, facial/tongue/throat swelling, SOB or lightheadedness with hypotension: no Has patient had a PCN reaction causing severe rash involving mucus membranes or skin necrosis: no Has patient had a PCN reaction that required hospitalization no Has patient had a PCN reaction occurring within the last 10 years no If all of the above answers are "NO", then may proceed with Cephalosporin use.   . Sulfa Antibiotics Nausea And Vomiting and Rash   ROS: The patient denies fever, headaches, vision changes, decreased hearing, ear pain, sore throat, breast concerns, chest pain, palpitations, dizziness, syncope, dyspnea on exertion, cough, swelling, nausea, vomiting, diarrhea, abdominal pain, melena, hematochezia, indigestion/heartburn, hematuria, dysuria, vaginal bleeding, discharge, odor or itch, genital lesions, joint pains (other than knee), numbness, tingling, weakness, tremor, suspicious skin lesions, depression, anxiety, abnormal bleeding/bruising, or enlarged lymph nodes. Very  intermittent dysphagia--feels like a lump if she drinks very cold water. She no longer has problems with taking calcium, as long as she doesn't take it with ice cold water.  Right knee pain. Constipation --controlled by Miralax.Hemorrhoids are large, but not bothersome, just slight pink on pads, no true bleeding.   PHYSICAL EXAM:  BP 126/84 (BP Location: Left Arm, Patient Position: Sitting, Cuff Size: Normal)   Pulse 64   Ht 5\' 2"  (1.575 m)   Wt 118 lb 6.4 oz (53.7 kg)   BMI 21.66 kg/m   Well developed, pleasant female in no distress HEENT: PERRL, EOMI, conjunctiva clear, OP clear Neck: no lymphadenopathy, thyromegaly or mass. No carotid bruit. Heart: regular rate and rhythm without murmur, rub, gallop or ectopy Lungs: clear bilaterally Abdomen: soft, nontender, nondistended, no mass Extremities: no edema, normal pulses Neuro: alert and oriented, cranial nerves, strength and gait normal Psych: normal mood, affect, hygiene and grooming   ASSESSMENT/PLAN:  Mixed hyperlipidemia - due for recheck; TG elevated last year.  Reviewed lowfat diet - Plan: Lipid panel  Essential hypertension, benign - well controlled  Osteoporosis without current pathological fracture, unspecified osteoporosis type - continue calcium, Vit D; add weight-bearing  exercise at least 2x/wk. Prolia recommended q6 mos rather than yearly  Medication monitoring encounter - Plan: Hepatic function panel, Lipid panel, Digoxin level  Palpitations - well controlled with verapamil and lanoxin--I suspect she will do fine with generic digoxin and should try it.  If not as effective or SE, can try for prior aut - Plan: digoxin (LANOXIN) 0.125 MG tablet, TSH, Digoxin level  Atony of bladder - doing well with suprapubic catheter  High risk medication use - Plan: Digoxin level   TSH, lipids, digoxin level, LFT   Encourage q6 mos Prolia Send note to Dr. [name] regarding q 6 month 01/2017 due for DEXA (ordered by Dr.  [name])  Weight bearing exercise encouraged at least 2x/wk.    I recommend you to get weight-bearing exercise (upper and lower body) at least 2-3 times per week to help keep your bones strong.  I am sending a prescription to your pharmacy for a 30 day trial of the generic digoxin.  You can try this when you are running low on your current branded medication (when you have only a month left--leaving yourself a month supply of the brand medication, in case there is a delay in getting the authorization for the brand if you don't tolerate the generic). Try the generic in place of the brand (in about 2 months).  If you have any side effects or increased palpitations where it isn't working as well as the brand, let us know and we can try and write a letter to get prior auth for the branded Lanoxin.  I truly feel you likely will do just fine on the generic, so let's try that and hope it works as well.  I'm going to send a note to Dr. [name].  I think taking Prolia injections twice a year is a better idea, given the degree of osteoporosis on your last bone density test.

## 2016-05-25 ENCOUNTER — Ambulatory Visit (INDEPENDENT_AMBULATORY_CARE_PROVIDER_SITE_OTHER): Payer: Medicare Other | Admitting: Family Medicine

## 2016-05-25 ENCOUNTER — Encounter: Payer: Self-pay | Admitting: Family Medicine

## 2016-05-25 VITALS — BP 126/84 | HR 64 | Ht 62.0 in | Wt 118.4 lb

## 2016-05-25 DIAGNOSIS — R002 Palpitations: Secondary | ICD-10-CM

## 2016-05-25 DIAGNOSIS — I10 Essential (primary) hypertension: Secondary | ICD-10-CM | POA: Diagnosis not present

## 2016-05-25 DIAGNOSIS — E782 Mixed hyperlipidemia: Secondary | ICD-10-CM

## 2016-05-25 DIAGNOSIS — N312 Flaccid neuropathic bladder, not elsewhere classified: Secondary | ICD-10-CM

## 2016-05-25 DIAGNOSIS — Z79899 Other long term (current) drug therapy: Secondary | ICD-10-CM | POA: Diagnosis not present

## 2016-05-25 DIAGNOSIS — M810 Age-related osteoporosis without current pathological fracture: Secondary | ICD-10-CM | POA: Diagnosis not present

## 2016-05-25 DIAGNOSIS — Z5181 Encounter for therapeutic drug level monitoring: Secondary | ICD-10-CM | POA: Diagnosis not present

## 2016-05-25 LAB — HEPATIC FUNCTION PANEL
ALT: 14 U/L (ref 6–29)
AST: 15 U/L (ref 10–35)
Albumin: 4.2 g/dL (ref 3.6–5.1)
Alkaline Phosphatase: 71 U/L (ref 33–130)
BILIRUBIN DIRECT: 0.1 mg/dL (ref <=0.2)
BILIRUBIN TOTAL: 0.7 mg/dL (ref 0.2–1.2)
Indirect Bilirubin: 0.6 mg/dL (ref 0.2–1.2)
Total Protein: 7.3 g/dL (ref 6.1–8.1)

## 2016-05-25 LAB — LIPID PANEL
CHOL/HDL RATIO: 3 ratio (ref <5.0)
Cholesterol: 186 mg/dL (ref <200)
HDL: 63 mg/dL (ref >50)
LDL Cholesterol: 82 mg/dL (ref <100)
TRIGLYCERIDES: 203 mg/dL — AB (ref <150)
VLDL: 41 mg/dL — AB (ref <30)

## 2016-05-25 LAB — TSH: TSH: 2.4 m[IU]/L

## 2016-05-25 MED ORDER — DIGOXIN 125 MCG PO TABS: 125.0000 ug | ORAL_TABLET | Freq: Every day | ORAL | 0 refills | Status: DC

## 2016-05-25 NOTE — Patient Instructions (Signed)
  I recommend you to get weight-bearing exercise (upper and lower body) at least 2-3 times per week to help keep your bones strong.  I am sending a prescription to your pharmacy for a 30 day trial of the generic digoxin.  You can try this when you are running low on your current branded medication (when you have only a month left--leaving yourself a month supply of the brand medication, in case there is a delay in getting the authorization for the brand if you don't tolerate the generic). Try the generic in place of the brand (in about 2 months).  If you have any side effects or increased palpitations where it isn't working as well as the brand, let us know and we can try and write a letter to get prior auth for the branded Lanoxin.  I truly feel you likely will do just fine on the generic, so let's try that and hope it works as well.  I'm going to send a note to Dr. [name].  I think taking Prolia injections twice a year is a better idea, given the degree of osteoporosis on your last bone density test.

## 2016-05-26 LAB — DIGOXIN LEVEL: Digoxin Level: 0.7 ug/L — ABNORMAL LOW (ref 0.8–2.0)

## 2016-06-08 DIAGNOSIS — N312 Flaccid neuropathic bladder, not elsewhere classified: Secondary | ICD-10-CM | POA: Diagnosis not present

## 2016-06-10 ENCOUNTER — Other Ambulatory Visit: Payer: Self-pay | Admitting: Family Medicine

## 2016-07-05 DIAGNOSIS — M1712 Unilateral primary osteoarthritis, left knee: Secondary | ICD-10-CM | POA: Diagnosis not present

## 2016-07-05 DIAGNOSIS — Z471 Aftercare following joint replacement surgery: Secondary | ICD-10-CM | POA: Diagnosis not present

## 2016-07-05 DIAGNOSIS — Z96651 Presence of right artificial knee joint: Secondary | ICD-10-CM | POA: Diagnosis not present

## 2016-07-06 DIAGNOSIS — N312 Flaccid neuropathic bladder, not elsewhere classified: Secondary | ICD-10-CM | POA: Diagnosis not present

## 2016-07-08 ENCOUNTER — Encounter (HOSPITAL_COMMUNITY): Payer: Self-pay

## 2016-07-08 ENCOUNTER — Emergency Department (HOSPITAL_COMMUNITY)
Admission: EM | Admit: 2016-07-08 | Discharge: 2016-07-08 | Disposition: A | Discharge status: Home / Self Care | Payer: Medicare Other | Attending: Emergency Medicine | Admitting: Emergency Medicine

## 2016-07-08 ENCOUNTER — Emergency Department (HOSPITAL_COMMUNITY): Payer: Medicare Other

## 2016-07-08 DIAGNOSIS — Z79899 Other long term (current) drug therapy: Secondary | ICD-10-CM | POA: Diagnosis not present

## 2016-07-08 DIAGNOSIS — T83098A Other mechanical complication of other indwelling urethral catheter, initial encounter: Secondary | ICD-10-CM | POA: Diagnosis not present

## 2016-07-08 DIAGNOSIS — Z853 Personal history of malignant neoplasm of breast: Secondary | ICD-10-CM | POA: Insufficient documentation

## 2016-07-08 DIAGNOSIS — Z96651 Presence of right artificial knee joint: Secondary | ICD-10-CM | POA: Insufficient documentation

## 2016-07-08 DIAGNOSIS — T83010A Breakdown (mechanical) of cystostomy catheter, initial encounter: Secondary | ICD-10-CM

## 2016-07-08 DIAGNOSIS — Y69 Unspecified misadventure during surgical and medical care: Secondary | ICD-10-CM | POA: Insufficient documentation

## 2016-07-08 DIAGNOSIS — Z452 Encounter for adjustment and management of vascular access device: Secondary | ICD-10-CM | POA: Diagnosis not present

## 2016-07-08 DIAGNOSIS — I10 Essential (primary) hypertension: Secondary | ICD-10-CM | POA: Diagnosis not present

## 2016-07-08 DIAGNOSIS — T83091A Other mechanical complication of indwelling urethral catheter, initial encounter: Secondary | ICD-10-CM | POA: Diagnosis not present

## 2016-07-08 LAB — URINALYSIS, ROUTINE W REFLEX MICROSCOPIC
BACTERIA UA: NONE SEEN
Bilirubin Urine: NEGATIVE
Glucose, UA: NEGATIVE mg/dL
KETONES UR: NEGATIVE mg/dL
Nitrite: NEGATIVE
PROTEIN: 100 mg/dL — AB
Specific Gravity, Urine: 1.008 (ref 1.005–1.030)
pH: 8 (ref 5.0–8.0)

## 2016-07-08 NOTE — ED Notes (Signed)
Patient transported to X-ray 

## 2016-07-08 NOTE — ED Triage Notes (Signed)
Patient here with super pubic catheter out this am. No pain, just changed this past thursday

## 2016-07-08 NOTE — Discharge Instructions (Signed)
Urine sample did not show any obvious infection. We will culture the urine and this information will be available in a few days. Follow-up with your urologist.

## 2016-07-08 NOTE — ED Notes (Signed)
Dr Lacinda Axon replaced suprapubic cath with fresh 69fr and draining well

## 2016-07-08 NOTE — ED Provider Notes (Signed)
[facility] DEPT Provider Note   CSN: [id number] Arrival date & time: 07/08/16  7341     History   Chief Complaint Chief Complaint  Patient presents with  . catheter out    HPI Mary [name] is a 83 y.o. female.  Patient presents with concern that her suprapubic catheter has fallen out. Otherwise she has no concerns. She continues to urinate via her urethra, with the suprapubic catheter is a backup system. No fever, sweats, chills, dysuria, flank pain.  She lives independently. Severity of symptoms is moderate.      Past Medical History:  Diagnosis Date  . Alopecia   . Ashkenazi Jewish ancestry   . External hemorrhoid   . Foley catheter in place   . Heart palpitations no cardiologist--  monitored by pcp   per pt "has been on verapamil and lanoxin for years and no palpitations for a very long time"  . History of acute bronchitis    dx 12-21-2014  finished ZPAK  . History of adenomatous polyp of colon    2003  . History of basal cell carcinoma excision    2006  left nasolabial fold  . History of benign colon tumor    2007  --  HIGH GRADE HYPERPLASTIC ,  S/P  LEFT HEMICOLECTOMY  . History of breast cancer ONCOLOGIST-  DR [name]--  antiestogen therapy with femera completed 12/  2012--  no recurrence   dx 10/  2007  right breast Invasive DCIS, grade 2, Stage 2A, pT2  pN0 pMX,  (ER 100% /PR 3% +),  HER +3) with 1 metastatic axill node---  s/p  right mastectomy   . Hypotonic bladder UROLOGIST-  DR [name]  . Mixed hyperlipidemia   . OA (osteoarthritis)    hip  . Osteoporosis    DEXA 01/2009; T-3.2 L fem neck, -2.4 L radius  . Urinary retention    02-13-2015    Patient Active Problem List   Diagnosis Date Noted  . Ashkenazi Jewish ancestry   . OA (osteoarthritis) of knee 01/24/2013  . Osteoporosis 06/01/2011  . Breast cancer of upper-outer quadrant of right female breast ([place]) 04/03/2011  . Essential hypertension, benign 12/01/2010  . Mixed hyperlipidemia  10/19/2010  . Palpitations 10/19/2010  . Atony of bladder 10/19/2010    Past Surgical History:  Procedure Laterality Date  . APPENDECTOMY  age 72  . BREAST LUMPECTOMY WITH NEEDLE LOCALIZATION AND AXILLARY SENTINEL LYMPH NODE BX Right 01-23-2006  . CARPAL TUNNEL RELEASE Right 04-24-2007   and Pulley Release index and small finger  . CATARACT EXTRACTION W/ INTRAOCULAR LENS  IMPLANT, BILATERAL  left 1991  &  right 1993  . COLONOSCOPY  last one 2013  . CYSTOSCOPY N/A 07/12/2015   Procedure: CYSTOSCOPY;  Surgeon: [name], MD;  Location: [facility];  Service: Urology;  Laterality: N/A;  . CYSTOSCOPY WITH BIOPSY N/A 03/01/2015   Procedure: CYSTOSCOPY WITH TAUBER BIOPSY OF 1CM RIGHT LATERAL BLADDER WALL AND CAUTERIZATION OF BIOPSY SITE ;  Surgeon: [name], MD;  Location: [facility];  Service: Urology;  Laterality: N/A;  . INSERTION OF SUPRAPUBIC CATHETER N/A 07/12/2015   Procedure: INSERTION OF SUPRAPUBIC CATHETER;  Surgeon: [name], MD;  Location: [facility];  Service: Urology;  Laterality: N/A;  . LAPAROSCOPIC ASSISTED LEFT HEMICOLECTOMY  06/  2007   high grade hyperplastic mass  . MASTECTOMY Right 02-28-2006  . PARTIAL KNEE ARTHROPLASTY Right 01/24/2013   Procedure: RIGHT KNEE MEDIAL UNICOMPARTMENTAL ARTHROPLASTY;  Surgeon: Gearlean Alf, MD;  Location: [facility];  Service: Orthopedics;  Laterality: Right;  . PULLEY RELEASE LEFT INDEX AND SMALL FINGER  08-07-2007  . TONSILLECTOMY  age 55    OB History    Gravida Para Term Preterm AB Living   3 3       3    SAB TAB Ectopic Multiple Live Births                   Home Medications    Prior to Admission medications   Medication Sig Start Date End Date Taking? Authorizing Provider  acetaminophen (TYLENOL) 500 MG tablet Take 1,000 mg by mouth every 6 (six) hours as needed.    Historical Provider, MD  atorvastatin (LIPITOR) 40 MG tablet TAKE 1 TABLET BY MOUTH EVERY  EVENING 06/12/16   Rita Ohara, MD  Calcium Carbonate-Vitamin D (CALCIUM 600+D) 600-400 MG-UNIT per tablet Take 1 tablet by mouth 2 (two) times daily.     Historical Provider, MD  Cholecalciferol (VITAMIN D3) 2000 UNITS TABS Take 2,000 Units by mouth daily.    Historical Provider, MD  denosumab (PROLIA) 60 MG/ML SOLN injection Inject 60 mg into the skin See admin instructions. Administer in upper arm, thigh, or abdomen 10/24/12   Historical Provider, MD  digoxin (LANOXIN) 0.125 MG tablet Take 1 tablet (125 mcg total) by mouth daily. 05/25/16   Rita Ohara, MD  LANOXIN 125 MCG tablet TAKE 1 TABLET BY MOUTH EVERY DAY. 04/17/16   Rita Ohara, MD  loratadine (CLARITIN) 10 MG tablet Take 10 mg by mouth daily as needed for allergies.     Historical Provider, MD  Multiple Vitamins-Minerals (CENTRUM ADULTS PO) Take 1 tablet by mouth daily.    Historical Provider, MD  Multiple Vitamins-Minerals (ICAPS AREDS 2) CAPS Take 1 capsule by mouth 2 (two) times daily.    Historical Provider, MD  ondansetron (ZOFRAN ODT) 4 MG disintegrating tablet Take 1 tablet (4 mg total) by mouth every 8 (eight) hours as needed for nausea or vomiting. Patient not taking: Reported on 05/25/2016 05/01/16   Rita Ohara, MD  polyethylene glycol [facility]) packet Take 17 g by mouth daily as needed for moderate constipation.     Historical Provider, MD  PROCTOZONE-HC 2.5 % rectal cream APPLY EXTERNALLY TO THE AFFECTED AREA TWICE DAILY 02/07/16   Rita Ohara, MD  promethazine (PHENERGAN) 25 MG suppository Place 1 suppository (25 mg total) rectally every 8 (eight) hours as needed for nausea or vomiting. Patient not taking: Reported on 05/25/2016 04/26/16   Rita Ohara, MD  VALIUM 2 MG tablet TAKE 1 TABLET BY MOUTH EVERY 8 HOURS FOR MUSCLE SPASMS OR ANXIETY Patient not taking: Reported on 05/01/2016 02/21/16   Rita Ohara, MD  verapamil (CALAN-SR) 240 MG CR tablet TAKE 1 TABLET(240 MG) BY MOUTH DAILY 12/21/15   Denita Lung, MD    Family History Family  History  Problem Relation Age of Onset  . Hypertension Mother   . Stroke Father   . Diabetes Father   . Hyperlipidemia Brother   . Hyperlipidemia Son   . Breast cancer Paternal Aunt     dx 92s; deceased early 22s  . Breast cancer Cousin     paternal first cousin  . Colon cancer Cousin     paternal first cousin; age dx 76s    Social History Social History  Substance Use Topics  . Smoking status: Never Smoker  . Smokeless tobacco: Never Used  . Alcohol use Yes  Comment: Very seldom     Allergies   Azithromycin; Arimidex [anastrozole]; Avelox [moxifloxacin hcl in nacl]; Bactrim; Biaxin [clarithromycin]; Macrobid [nitrofurantoin]; Penicillins; and Sulfa antibiotics   Review of Systems Review of Systems  All other systems reviewed and are negative.    Physical Exam Updated Vital Signs BP (!) 188/88   Pulse 82   Temp 97.4 F (36.3 C) (Oral)   Resp 18   SpO2 98%   Physical Exam  Constitutional: She is oriented to person, place, and time. She appears well-developed and well-nourished.  HENT:  Head: Normocephalic and atraumatic.  Eyes: Conjunctivae are normal.  Neck: Neck supple.  Cardiovascular: Normal rate and regular rhythm.   Pulmonary/Chest: Effort normal and breath sounds normal.  Abdominal:  Suprapubic catheter is out of her abdomen  Musculoskeletal: Normal range of motion.  Neurological: She is alert and oriented to person, place, and time.  Skin: Skin is warm and dry.  Psychiatric: She has a normal mood and affect. Her behavior is normal.  Nursing note and vitals reviewed.    ED Treatments / Results  Labs (all labs ordered are listed, but only abnormal results are displayed) Labs Reviewed  URINALYSIS, ROUTINE W REFLEX MICROSCOPIC - Abnormal; Notable for the following:       Result Value   Color, Urine STRAW (*)    Hgb urine dipstick SMALL (*)    Protein, ur 100 (*)    Leukocytes, UA MODERATE (*)    Squamous Epithelial / LPF 0-5 (*)    All  other components within normal limits  URINE CULTURE    EKG  EKG Interpretation None       Radiology Dg Abd 2 Views  Result Date: 07/08/2016 CLINICAL DATA:  Suprapubic catheter dysfunction. EXAM: ABDOMEN - 2 VIEW COMPARISON:  CT, 03/08/2006 FINDINGS: On the lateral view, the tip of the suprapubic catheter projects anterior to the expected location of the bladder, just below the anterior pelvic peritoneal fascia. It projects along the superior margin of the bladder shadow on the AP view. Normal bowel gas pattern. Mild generalized increased stool is noted in the colon and rectum. Bowel anastomosis staples project in the left lower quadrant adjacent to multiple vascular clips. Soft tissues are otherwise unremarkable. There are degenerative changes of the visualized spine and arthropathic changes of the left hip. IMPRESSION: 1. Suprapubic catheter tip appears to be extra vesicular, projecting along the anterior margin of the pelvic peritoneal cavity anterior and superior to the bladder. Electronically Signed   By: Lajean Manes M.D.   On: 07/08/2016 09:46    Procedures SUPRAPUBIC TUBE PLACEMENT Date/Time: 07/08/2016 10:15 AM Performed by: Nat Christen Authorized by: Nat Christen   Consent:    Consent obtained:  Verbal   Consent given by:  Patient   Risks discussed:  Pain Anesthesia (see MAR for exact dosages):    Anesthesia method:  None Procedure details:    Complexity:  Simple   Catheter type:  Foley   Catheter size:  16 Fr   Ultrasound guidance: no     Number of attempts:  1   Urine characteristics:  Clear Post-procedure details:    Patient tolerance of procedure:  Tolerated well, no immediate complications Comments:     Suprapubic catheter placed with no resistance. Clear urine flow obtained. Bulb inflated with 10 mL of saline   (including critical care time)  Medications Ordered in ED Medications - No data to display   Initial Impression / Assessment and Plan / ED Course  I have reviewed the triage vital signs and the nursing notes.  Pertinent labs & imaging results that were available during my care of the patient were reviewed by me and considered in my medical decision making (see chart for details).     Patient is hemodynamically stable. Her suprapubic catheter was replaced by examiner. Urinalysis appears reasonable. Culture pending.  Final Clinical Impressions(s) / ED Diagnoses   Final diagnoses:  Suprapubic catheter dysfunction, initial encounter [facility])    New Prescriptions New Prescriptions   No medications on file     Nat Christen, MD 07/08/16 1133

## 2016-07-08 NOTE — ED Notes (Signed)
Capped suprapubic per pt request

## 2016-07-09 LAB — URINE CULTURE: Culture: <10000 — AB

## 2016-07-10 DIAGNOSIS — N312 Flaccid neuropathic bladder, not elsewhere classified: Secondary | ICD-10-CM | POA: Diagnosis not present

## 2016-07-17 ENCOUNTER — Telehealth: Payer: Self-pay

## 2016-07-17 DIAGNOSIS — R002 Palpitations: Secondary | ICD-10-CM

## 2016-07-17 MED ORDER — DIGOXIN 125 MCG PO TABS: 125.0000 ug | ORAL_TABLET | Freq: Every day | ORAL | 0 refills | Status: DC

## 2016-07-17 NOTE — Telephone Encounter (Signed)
Fax request from [employer]. [place] for rx of digoxin 0.125mg  90 days. Mary [name] December

## 2016-07-17 NOTE — Telephone Encounter (Signed)
Done

## 2016-07-18 DIAGNOSIS — M7031 Other bursitis of elbow, right elbow: Secondary | ICD-10-CM | POA: Diagnosis not present

## 2016-07-24 ENCOUNTER — Encounter: Payer: Self-pay | Admitting: Family Medicine

## 2016-07-24 ENCOUNTER — Ambulatory Visit (INDEPENDENT_AMBULATORY_CARE_PROVIDER_SITE_OTHER): Payer: Medicare Other | Admitting: Family Medicine

## 2016-07-24 VITALS — BP 136/84 | HR 76 | Temp 98.4°F | Ht 62.0 in | Wt 122.0 lb

## 2016-07-24 DIAGNOSIS — L089 Local infection of the skin and subcutaneous tissue, unspecified: Secondary | ICD-10-CM

## 2016-07-24 DIAGNOSIS — L300 Nummular dermatitis: Secondary | ICD-10-CM | POA: Diagnosis not present

## 2016-07-24 MED ORDER — MUPIROCIN CALCIUM 2 % EX CREA: 1.0000 "application " | TOPICAL_CREAM | Freq: Three times a day (TID) | CUTANEOUS | 0 refills | Status: DC

## 2016-07-24 MED ORDER — TRIAMCINOLONE ACETONIDE 0.1 % EX CREA: 1.0000 "application " | TOPICAL_CREAM | Freq: Two times a day (BID) | CUTANEOUS | 0 refills | Status: DC

## 2016-07-24 NOTE — Progress Notes (Signed)
Chief Complaint  Patient presents with  . Rash    on left ankle x 4 weeks and then another place on right leg. Tried triamcinolone .1% (from [employer]) helped some but stopped using after 3-4 days. Stopped drinking OJ but no difference. Slightly itchy but not painful. Wonders if one of her rx's could be causing this?    5-6 weeks ago, she started with a small rash on the left medial ankle. It doesn't really bother her, only minimally itchy.  She has dry skin.  She felt like her lips were a little puffy when she first started with the ankle rash, but that has improved using a moisturizer.2-3 weeks ago she noticed a similarly appearing lesion on the right lateral leg (upper portion of lower leg)--looks the same as how the one on the ankle started, but the one on the ankle has grown in size. It did weep a little at one point.   After a week or so, her nephew gave her TAC 0.1% he had in the house--used it once or twice daily for 3 days or so, it helped a lot, didn't completely go away (but got "much much better").  Didn't restart it after the rash came back again. She brought in the tube--it expired in 2012.  She tried to see her dermatologist, but she couldn't get an appointment with Dr. Delman Cheadle until 5/11.  She called multiple times trying for a cancellation, but there weren't any, so she called the other day to see if we could help her out.  PMH, PSH, SH reviewed  Outpatient Encounter Prescriptions as of 07/24/2016  Medication Sig Note  . atorvastatin (LIPITOR) 40 MG tablet TAKE 1 TABLET BY MOUTH EVERY EVENING   . Calcium Carbonate-Vitamin D (CALCIUM 600+D) 600-400 MG-UNIT per tablet Take 1 tablet by mouth 2 (two) times daily.    . Cholecalciferol (VITAMIN D3) 2000 UNITS TABS Take 2,000 Units by mouth daily.   Marland Kitchen denosumab (PROLIA) 60 MG/ML SOLN injection Inject 60 mg into the skin See admin instructions. Administer in upper arm, thigh, or abdomen 07/09/2014: yearly  . digoxin (LANOXIN) 0.125 MG  tablet Take 1 tablet (125 mcg total) by mouth daily.   Marland Kitchen loratadine (CLARITIN) 10 MG tablet Take 10 mg by mouth daily as needed for allergies.    . Multiple Vitamins-Minerals (CENTRUM ADULTS PO) Take 1 tablet by mouth daily.   . Multiple Vitamins-Minerals (ICAPS AREDS 2) CAPS Take 1 capsule by mouth 2 (two) times daily.   . polyethylene glycol (MIRALAX / GLYCOLAX) packet Take 17 g by mouth daily as needed for moderate constipation.  05/25/2016: Takes prn, reports she "should be" taking it daily, but forgets  . verapamil (CALAN-SR) 240 MG CR tablet TAKE 1 TABLET(240 MG) BY MOUTH DAILY   . acetaminophen (TYLENOL) 500 MG tablet Take 1,000 mg by mouth every 6 (six) hours as needed.   . mupirocin cream (BACTROBAN) 2 % Apply 1 application topically 3 (three) times daily.   . ondansetron (ZOFRAN ODT) 4 MG disintegrating tablet Take 1 tablet (4 mg total) by mouth every 8 (eight) hours as needed for nausea or vomiting. (Patient not taking: Reported on 05/25/2016)   . PROCTOZONE-HC 2.5 % rectal cream APPLY EXTERNALLY TO THE AFFECTED AREA TWICE DAILY (Patient not taking: Reported on 07/24/2016)   . promethazine (PHENERGAN) 25 MG suppository Place 1 suppository (25 mg total) rectally every 8 (eight) hours as needed for nausea or vomiting. (Patient not taking: Reported on 05/25/2016)   .  triamcinolone cream (KENALOG) 0.1 % Apply 1 application topically 2 (two) times daily.   Marland Kitchen VALIUM 2 MG tablet TAKE 1 TABLET BY MOUTH EVERY 8 HOURS FOR MUSCLE SPASMS OR ANXIETY (Patient not taking: Reported on 05/01/2016)   . [DISCONTINUED] LANOXIN 125 MCG tablet TAKE 1 TABLET BY MOUTH EVERY DAY.    No facility-administered encounter medications on file as of 07/24/2016.    bactroban rx'd today, not used prior to visit.  Allergies  Allergen Reactions  . Azithromycin Nausea Only  . Arimidex [Anastrozole] Rash  . Avelox [Moxifloxacin Hcl In Nacl] Nausea Only and Rash  . Bactrim Nausea Only and Rash  . Biaxin [Clarithromycin] Nausea  Only and Rash  . Macrobid [Nitrofurantoin] Nausea Only and Rash  . Penicillins Nausea Only and Rash    Has patient had a PCN reaction causing immediate rash, facial/tongue/throat swelling, SOB or lightheadedness with hypotension: no Has patient had a PCN reaction causing severe rash involving mucus membranes or skin necrosis: no Has patient had a PCN reaction that required hospitalization no Has patient had a PCN reaction occurring within the last 10 years no If all of the above answers are "NO", then may proceed with Cephalosporin use.   . Sulfa Antibiotics Nausea And Vomiting and Rash   ROS:  No fever, chills, headaches, dizziness, chest pain, edema, other skin lesions, bleeding, bruising or other concerns  PHYSICAL EXAM:   BP 136/84 (BP Location: Left Arm, Patient Position: Sitting, Cuff Size: Normal)   Pulse 76   Temp 98.4 F (36.9 C) (Tympanic)   Ht 5\' 2"  (1.575 m)   Wt 122 lb (55.3 kg)   BMI 22.31 kg/m   Well developed, pleasant, elderly female in good spirits Skin:  3.5cm x 2-2.2 hyperkeratotic area, with thickening, somewhat crusted centrally at the left anteromedial ankle.  nontender. No surrounding erythema, warmth or streaks  Right upper lateral portion of lower leg: 2.5 x 1.2 cm--raised edges, central portion of inferior part has some clearing, but upper part has involvement as well.  ASSESSMENT/PLAN:  Nummular eczema - Plan: triamcinolone cream (KENALOG) 0.1 %  Skin infection - Plan: mupirocin cream (BACTROBAN) 2 %    Suspect nummular eczema with possible early infection on the left ankle lesion. Had significant reported benefit from the TAC (expired). Will give new rx for TAC, instructed on proper use, sparingly, and if/when to switch to OTC HC. She has many allergies, so we elected to start with topical bactroban.  Use Keflex (discussed potential allergic reaction) if any increasing in redness, fever, weeping, etc.    Apply the new prescription steroid  cream twice daily to the affected area of skin.  Use it sparingly.   Okay to leave it open, but cover it only if it needs protection (from getting dirty or scraped). Use the mupirocin three times daily--this is the medication to treat the infection. Use this for 10 days (can stop sooner if 100% better0>  Use the new steroid cream (same as the old one, just not expired) twice daily--use this for up to 10-14 days, and follow up if not better.  If it is significantly better sooner than that (I expect it will be), then you can stop using the prescription and use an over-the-counter Cortaid (hydrcorticone) cream for the very last remnants of the rash.  Do not continue to use the prescription steroid cream on normal appearing skin.  If for some reason your rash gets significantly WORSE after using the steroid cream, it could mean  that there is a fungal infection (ie ringworm). If that happens, STOP using the triamcinolone, and instead get either lamisil or clotrimazole cream to use twice daily (and likely need to use it for 3-4 weeks).

## 2016-07-24 NOTE — Patient Instructions (Addendum)
Apply the new prescription steroid cream twice daily to the affected area of skin.  Use it sparingly.   Okay to leave it open, but cover it only if it needs protection (from getting dirty or scraped). Use the mupirocin three times daily--this is the medication to treat the infection. Use this for 10 days (can stop sooner if 100% better0>  Use the new steroid scream (same as the old one, just not expired) twice daily--use this for up to 10-14 days, and follow up if not better.  If it is significantly better sooner than that (I expect it will be), then you can stop using the prescription and use an over-the-counter Cortaid (hydrcorticone) cream for the very last remnants of the rash.  Do not continue to use the prescription steroid cream on normal appearing skin.  If for some reason your rash gets significantly WORSE after using the steroid cream, it could mean that there is a fungal infection (ie ringworm). If that happens, STOP using the triamcinolone, and instead get either lamisil or clotrimazole cream to use twice daily (and likely need to use it for 3-4 weeks).   Eczema Eczema, also called atopic dermatitis, is a skin disorder that causes inflammation of the skin. It causes a red rash and dry, scaly skin. The skin becomes very itchy. Eczema is generally worse during the cooler winter months and often improves with the warmth of summer. Eczema usually starts showing signs in infancy. Some children outgrow eczema, but it may last through adulthood. What are the causes? The exact cause of eczema is not known, but it appears to run in families. People with eczema often have a family history of eczema, allergies, asthma, or hay fever. Eczema is not contagious. Flare-ups of the condition may be caused by:  Contact with something you are sensitive or allergic to.  Stress. What are the signs or symptoms?  Dry, scaly skin.  Red, itchy rash.  Itchiness. This may occur before the skin rash and may  be very intense. How is this diagnosed? The diagnosis of eczema is usually made based on symptoms and medical history. How is this treated? Eczema cannot be cured, but symptoms usually can be controlled with treatment and other strategies. A treatment plan might include:  Controlling the itching and scratching.  Use over-the-counter antihistamines as directed for itching. This is especially useful at night when the itching tends to be worse.  Use over-the-counter steroid creams as directed for itching.  Avoid scratching. Scratching makes the rash and itching worse. It may also result in a skin infection (impetigo) due to a break in the skin caused by scratching.  Keeping the skin well moisturized with creams every day. This will seal in moisture and help prevent dryness. Lotions that contain alcohol and water should be avoided because they can dry the skin.  Limiting exposure to things that you are sensitive or allergic to (allergens).  Recognizing situations that cause stress.  Developing a plan to manage stress. Follow these instructions at home:  Only take over-the-counter or prescription medicines as directed by your health care provider.  Do not use anything on the skin without checking with your health care provider.  Keep baths or showers short (5 minutes) in warm (not hot) water. Use mild cleansers for bathing. These should be unscented. You may add nonperfumed bath oil to the bath water. It is best to avoid soap and bubble bath.  Immediately after a bath or shower, when the skin is still  damp, apply a moisturizing ointment to the entire body. This ointment should be a petroleum ointment. This will seal in moisture and help prevent dryness. The thicker the ointment, the better. These should be unscented.  Keep fingernails cut short. Children with eczema may need to wear soft gloves or mittens at night after applying an ointment.  Dress in clothes made of cotton or cotton  blends. Dress lightly, because heat increases itching.  A child with eczema should stay away from anyone with fever blisters or cold sores. The virus that causes fever blisters (herpes simplex) can cause a serious skin infection in children with eczema. Contact a health care provider if:  Your itching interferes with sleep.  Your rash gets worse or is not better within 1 week after starting treatment.  You see pus or soft yellow scabs in the rash area.  You have a fever.  You have a rash flare-up after contact with someone who has fever blisters. This information is not intended to replace advice given to you by your health care provider. Make sure you discuss any questions you have with your health care provider. Document Released: 03/03/2000 Document Revised: 08/12/2015 Document Reviewed: 10/07/2012 Elsevier Interactive Patient Education  2017 [employer].

## 2016-07-25 ENCOUNTER — Encounter: Payer: Self-pay | Admitting: Family Medicine

## 2016-08-03 DIAGNOSIS — N312 Flaccid neuropathic bladder, not elsewhere classified: Secondary | ICD-10-CM | POA: Diagnosis not present

## 2016-08-09 IMAGING — CR DG CHEST 2V
2 series · 2 of 2 positions shown · non-contrast
Comparison: 01/16/2013, 02/24/2009, 04/22/2007

CLINICAL DATA: 88-year-old female with a history of bronchitis and
cough for 6 days.

EXAM:
CHEST - 2 VIEW

[w chest pa]
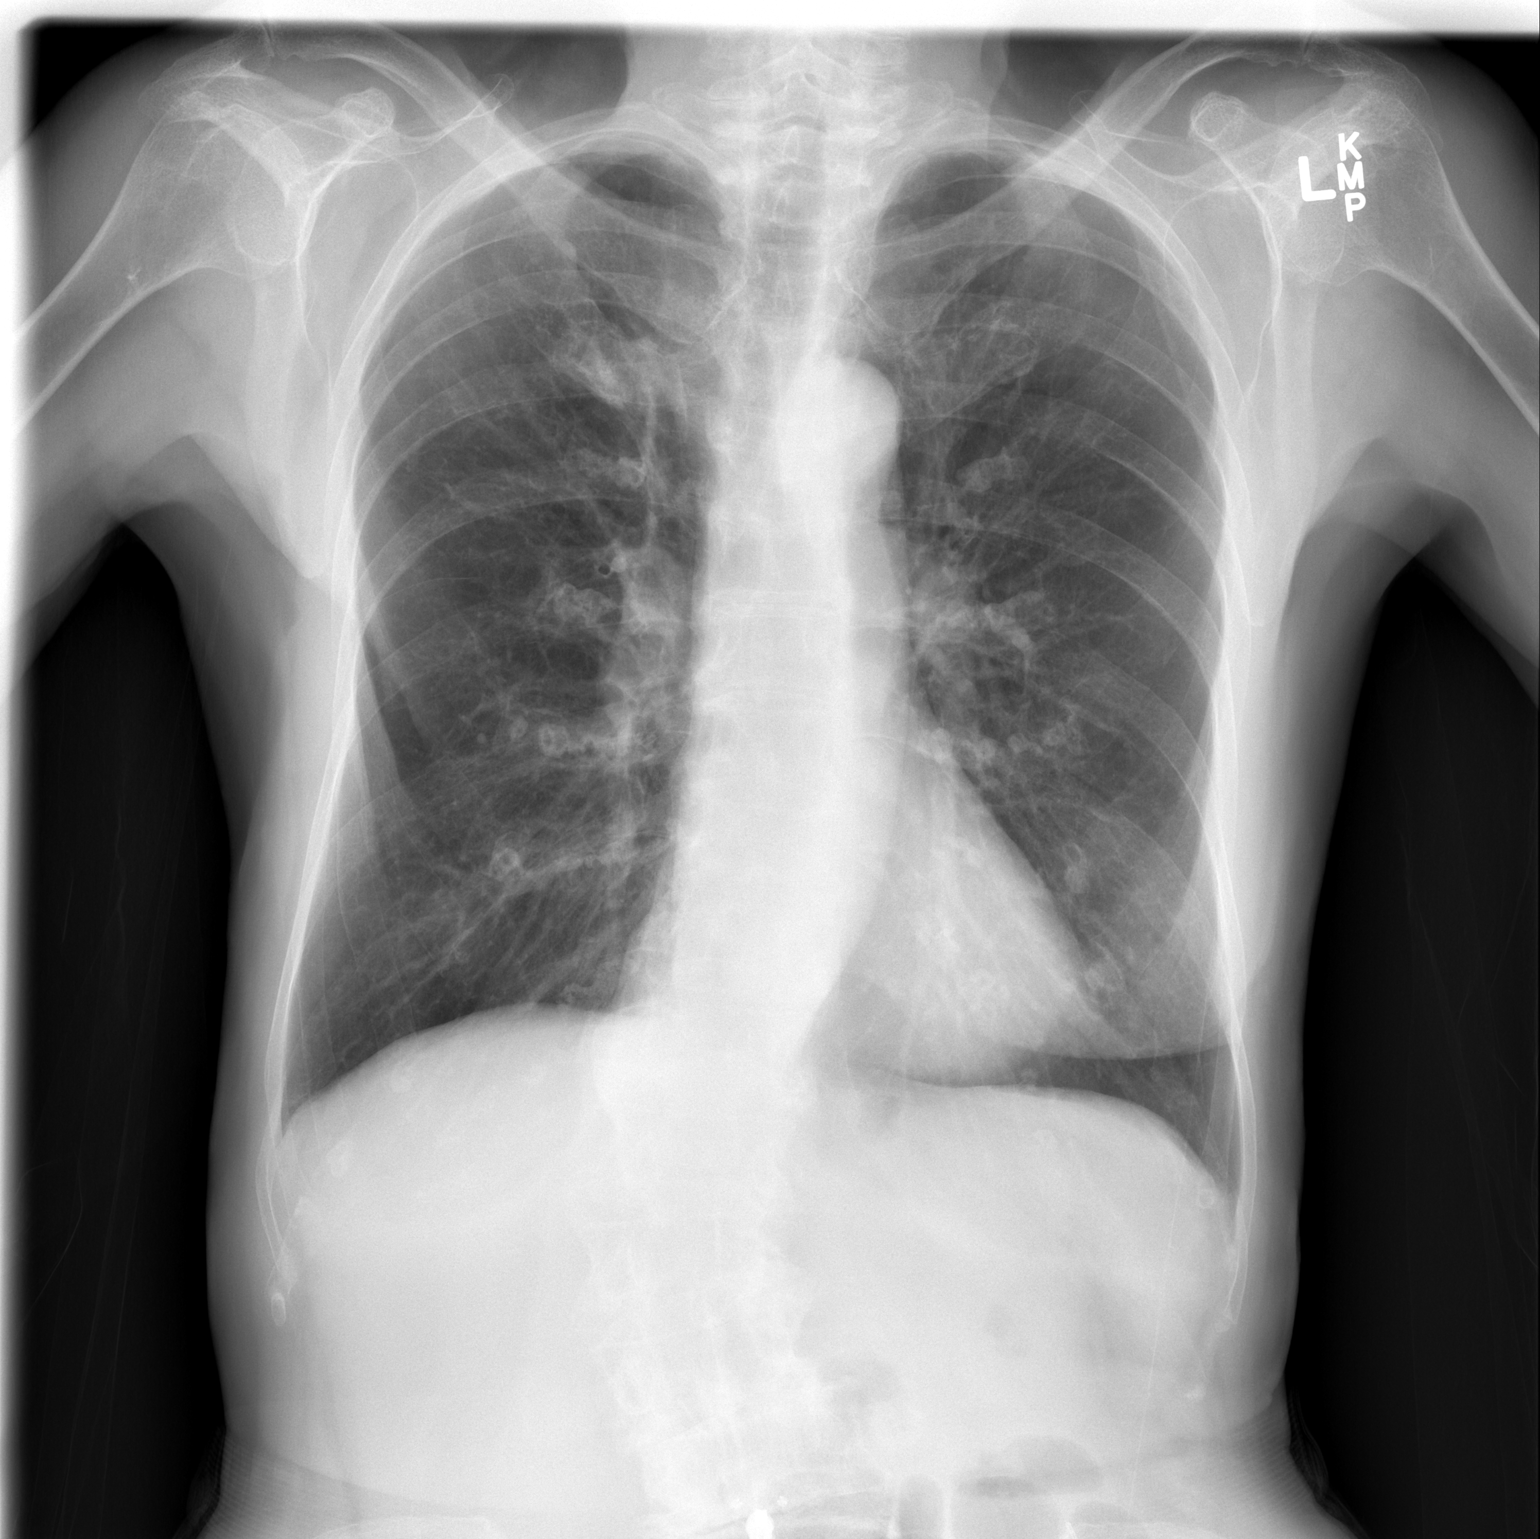

[w chest lat]
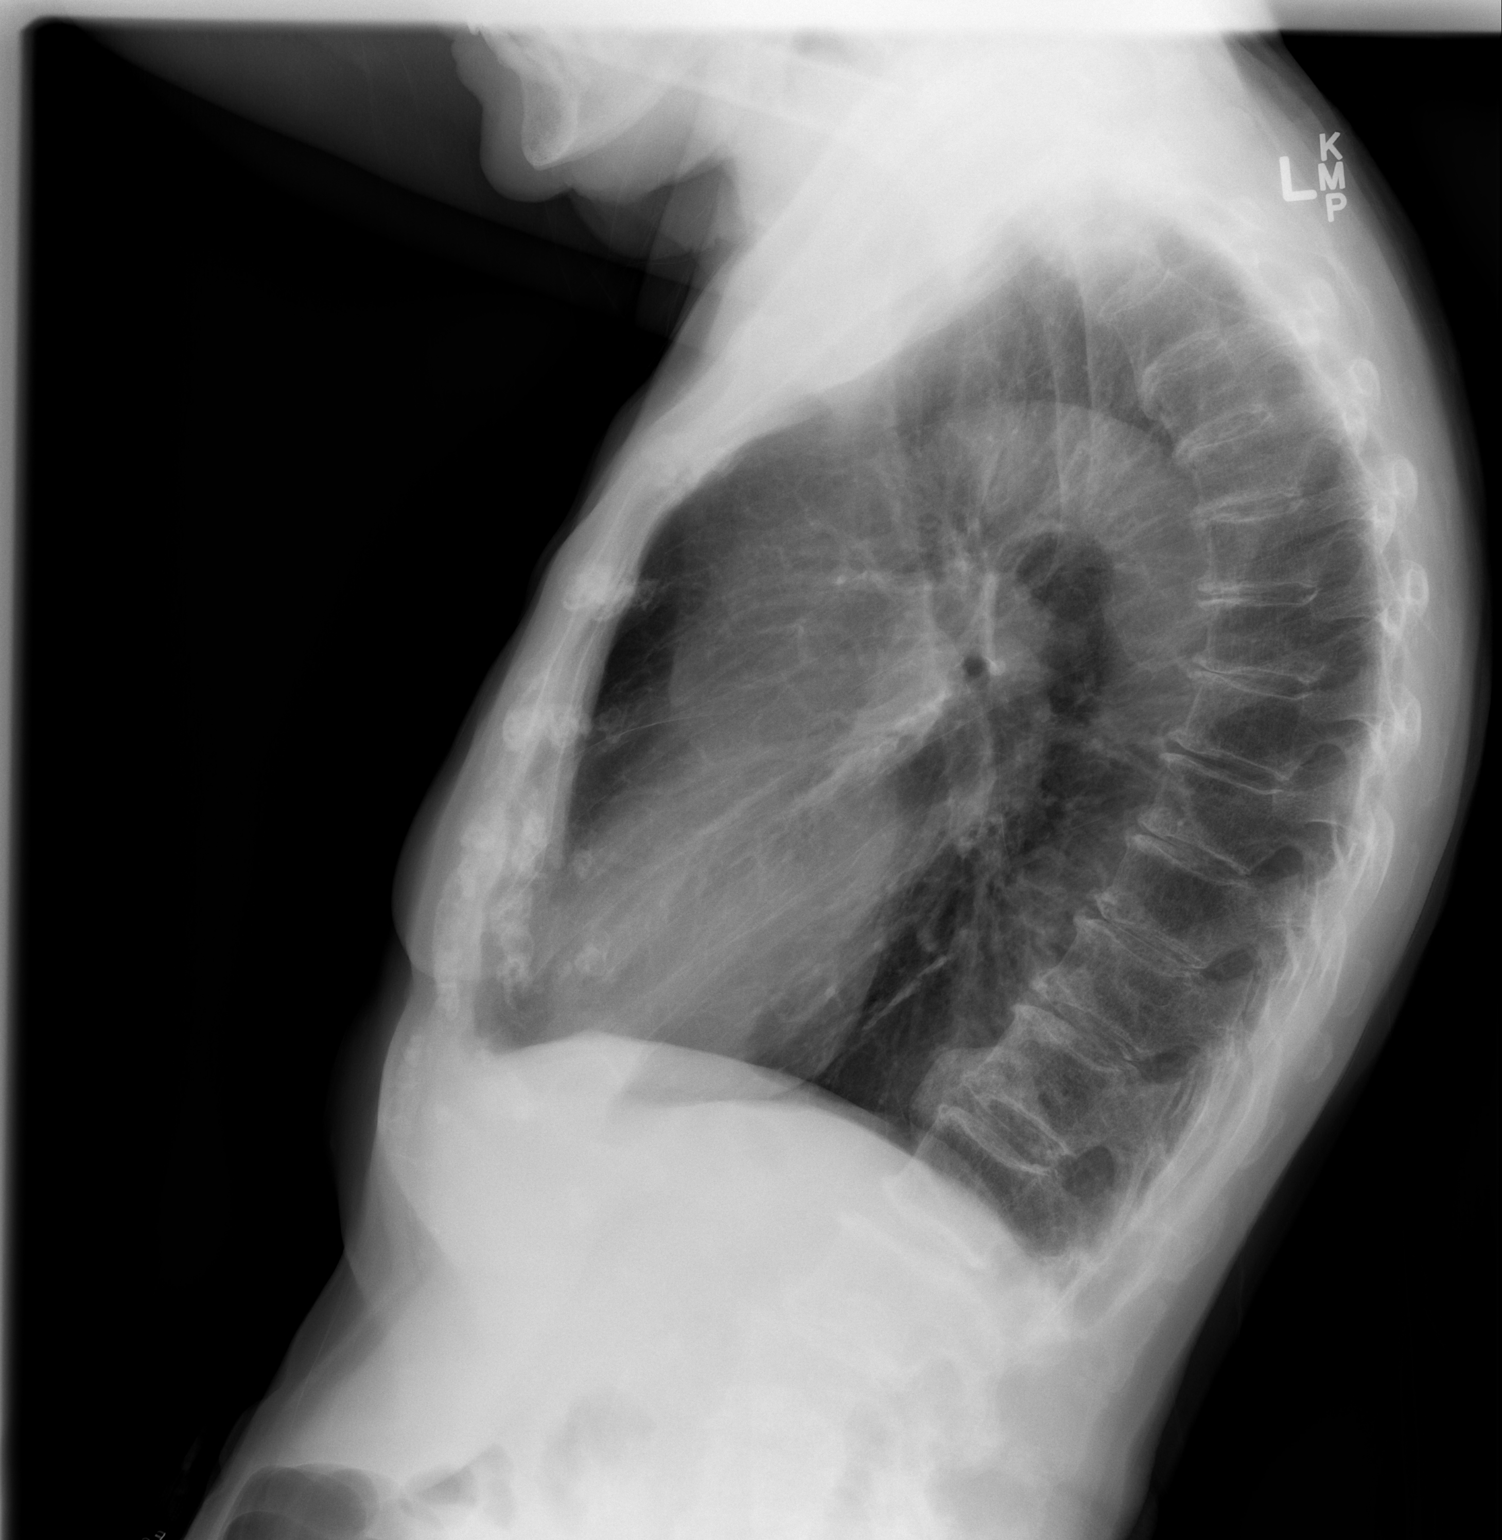

[2 of 2 positions shown; findings below may reference images not displayed]

FINDINGS: Cardiomediastinal silhouette projects within normal limits in size
and contour.

Stigmata of emphysema, with increased retrosternal airspace,
flattened hemidiaphragms, increased AP diameter, and hyperinflation
on the AP view.

Calcifications of the aortic arch.

No confluent airspace disease, pneumothorax, or pleural effusion.

Degenerative changes of the spine including disc narrowing, anterior
osteophyte production. Rounded calcifications overlying the spine on
the lateral view, present on the comparison and favored to represent
anterior osteophyte calcifications.

No displaced fracture.
IMPRESSION: Chronic lung changes and emphysema without evidence of superimposed
acute cardiopulmonary disease.

## 2016-08-11 DIAGNOSIS — M7031 Other bursitis of elbow, right elbow: Secondary | ICD-10-CM | POA: Diagnosis not present

## 2016-08-18 DIAGNOSIS — M1712 Unilateral primary osteoarthritis, left knee: Secondary | ICD-10-CM | POA: Diagnosis not present

## 2016-08-28 ENCOUNTER — Ambulatory Visit (INDEPENDENT_AMBULATORY_CARE_PROVIDER_SITE_OTHER): Payer: Medicare Other | Admitting: Family Medicine

## 2016-08-28 ENCOUNTER — Encounter: Payer: Self-pay | Admitting: Family Medicine

## 2016-08-28 VITALS — BP 132/78 | HR 84 | Ht 62.0 in | Wt 120.6 lb

## 2016-08-28 DIAGNOSIS — L300 Nummular dermatitis: Secondary | ICD-10-CM | POA: Diagnosis not present

## 2016-08-28 DIAGNOSIS — R002 Palpitations: Secondary | ICD-10-CM

## 2016-08-28 DIAGNOSIS — K130 Diseases of lips: Secondary | ICD-10-CM

## 2016-08-28 DIAGNOSIS — I10 Essential (primary) hypertension: Secondary | ICD-10-CM

## 2016-08-28 NOTE — Patient Instructions (Signed)
  Your exam was normal. The claritin potentially could be a little drying.  Since your nasal exam didn't look like your allergies are terrible right now, go ahead and stop the claritin for up to 1-2 weeks to see if your lips feel any better.  If allergy symptoms start back up again, you can either resume the claritin, or try a nasal steroid spray such as Flonase.  If symptoms persist, and it doesn't seem to be related to the claritin, the only change that I can think of is that your lanoxin was changed to the generic.    If you develop increasing swelling, pain, cracking or other changes, please lt me know.  Continue to use moisturizing agents and/or Vaseline to keep the lips moist.  Avoid a lot of salt or acidic exposure.  Avoid pretzels (or at least avoid them contacting the lips).

## 2016-08-28 NOTE — Progress Notes (Signed)
Chief Complaint  Patient presents with  . Dry lips    dry lips x 1 month.    Lips have been very dry for the last week or so, but mildly dry over the last month. She denies any new lip products.   She stopped drinking orange juice, didn't make a difference. She takes claritin daily, and has been for a while.  She is wondering which of her medications could be causing dry mouth.   Her Lanoxin changed to generic in the last month or so. She denies any side effects, feels like it is working well, no palpitations. She does admit to still eating some sweets, as well as salt--eating pretzel sticks.  Rash on the legs completely resolved.  PMH, PSH, SH reviewed  Outpatient Encounter Prescriptions as of 08/28/2016  Medication Sig Note  . atorvastatin (LIPITOR) 40 MG tablet TAKE 1 TABLET BY MOUTH EVERY EVENING   . Calcium Carbonate-Vitamin D (CALCIUM 600+D) 600-400 MG-UNIT per tablet Take 1 tablet by mouth 2 (two) times daily.    . Cholecalciferol (VITAMIN D3) 2000 UNITS TABS Take 2,000 Units by mouth daily.   Marland Kitchen denosumab (PROLIA) 60 MG/ML SOLN injection Inject 60 mg into the skin See admin instructions. Administer in upper arm, thigh, or abdomen 07/09/2014: yearly  . digoxin (LANOXIN) 0.125 MG tablet Take 1 tablet (125 mcg total) by mouth daily.   Marland Kitchen loratadine (CLARITIN) 10 MG tablet Take 10 mg by mouth daily as needed for allergies.    . Multiple Vitamins-Minerals (CENTRUM ADULTS PO) Take 1 tablet by mouth daily.   . Multiple Vitamins-Minerals (ICAPS AREDS 2) CAPS Take 1 capsule by mouth 2 (two) times daily.   . polyethylene glycol (MIRALAX / GLYCOLAX) packet Take 17 g by mouth daily as needed for moderate constipation.  05/25/2016: Takes prn, reports she "should be" taking it daily, but forgets  . verapamil (CALAN-SR) 240 MG CR tablet TAKE 1 TABLET(240 MG) BY MOUTH DAILY   . acetaminophen (TYLENOL) 500 MG tablet Take 1,000 mg by mouth every 6 (six) hours as needed.   . mupirocin cream (BACTROBAN) 2  % Apply 1 application topically 3 (three) times daily. (Patient not taking: Reported on 08/28/2016)   . ondansetron (ZOFRAN ODT) 4 MG disintegrating tablet Take 1 tablet (4 mg total) by mouth every 8 (eight) hours as needed for nausea or vomiting. (Patient not taking: Reported on 05/25/2016)   . PROCTOZONE-HC 2.5 % rectal cream APPLY EXTERNALLY TO THE AFFECTED AREA TWICE DAILY (Patient not taking: Reported on 07/24/2016)   . promethazine (PHENERGAN) 25 MG suppository Place 1 suppository (25 mg total) rectally every 8 (eight) hours as needed for nausea or vomiting. (Patient not taking: Reported on 05/25/2016)   . triamcinolone cream (KENALOG) 0.1 % Apply 1 application topically 2 (two) times daily. (Patient not taking: Reported on 08/28/2016)   . VALIUM 2 MG tablet TAKE 1 TABLET BY MOUTH EVERY 8 HOURS FOR MUSCLE SPASMS OR ANXIETY (Patient not taking: Reported on 05/01/2016)    No facility-administered encounter medications on file as of 08/28/2016.    Allergies  Allergen Reactions  . Azithromycin Nausea Only  . Arimidex [Anastrozole] Rash  . Avelox [Moxifloxacin Hcl In Nacl] Nausea Only and Rash  . Bactrim Nausea Only and Rash  . Biaxin [Clarithromycin] Nausea Only and Rash  . Macrobid [Nitrofurantoin] Nausea Only and Rash  . Penicillins Nausea Only and Rash    Has patient had a PCN reaction causing immediate rash, facial/tongue/throat swelling, SOB or lightheadedness with  hypotension: no Has patient had a PCN reaction causing severe rash involving mucus membranes or skin necrosis: no Has patient had a PCN reaction that required hospitalization no Has patient had a PCN reaction occurring within the last 10 years no If all of the above answers are "NO", then may proceed with Cephalosporin use.   . Sulfa Antibiotics Nausea And Vomiting and Rash   ROS: no fever, chills.  Denies any allergy symptoms (take claritin daily).  Denies dry mouth, just dry lips. No chest pain, palpitations, URI symptoms (had a  cold a month ago, resolved).  No bleeding, bruising, new rashes or other concerns.  PHYSICAL EXAM:  BP 132/78 (BP Location: Left Arm, Patient Position: Sitting, Cuff Size: Normal)   Pulse 84   Ht 5\' 2"  (1.575 m)   Wt 120 lb 9.6 oz (54.7 kg)   BMI 22.06 kg/m   Well appearing, pleasant female, appears younger than stated age HEENT: conjunctiva and sclera are clear. Nasal mucosa appears normal, no erythema or edema, no drainage.  OP exam shows torus pallatini (upper and lower), moist mucus membranes.  Lips are smooth, and appear normal. Neck: no lymphadenopathy or mass Heart: regular rate and rhythm, no murmur Lungs: clear bilaterally Extremities: no edema Skin: very slight residual dryness at right upper leg. Area at left lower leg completely resolved (some residual hyperpigmentation). Psych: normal mood, affect, hygiene and grooming Neuro: alert and oriented, cranial nerves intact, normal gait  ASSESSMENT/PLAN:  Dry lips - suspect related to pretzel sticks (salt exposure) vs dryness from anti-histamines. mild  Palpitations - well controlled, even since changing to generic digoxin  Nummular eczema - resolved--small residual dryness, to keep well moisturized  Essential hypertension, benign - controlled; low sodium diet recommended   Your exam was normal. The claritin potentially could be a little drying.  Since your nasal exam didn't look like your allergies are terrible right now, go ahead and stop the claritin for up to 1-2 weeks to see if your lips feel any better.  If allergy symptoms start back up again, you can either resume the claritin, or try a nasal steroid spray such as Flonase.  If symptoms persist, and it doesn't seem to be related to the claritin, the only change that I can think of is that your lanoxin was changed to the generic.    If you develop increasing swelling, pain, cracking or other changes, please lt me know.  Continue to use moisturizing agents and/or  Vaseline to keep the lips moist.  Avoid a lot of salt or acidic exposure.  Avoid pretzels (or at least avoid them contacting the lips).

## 2016-08-31 DIAGNOSIS — N312 Flaccid neuropathic bladder, not elsewhere classified: Secondary | ICD-10-CM | POA: Diagnosis not present

## 2016-09-03 ENCOUNTER — Other Ambulatory Visit: Payer: Self-pay | Admitting: Family Medicine

## 2016-09-03 DIAGNOSIS — R002 Palpitations: Secondary | ICD-10-CM

## 2016-09-04 ENCOUNTER — Telehealth: Payer: Self-pay | Admitting: Family Medicine

## 2016-09-04 DIAGNOSIS — R002 Palpitations: Secondary | ICD-10-CM

## 2016-09-04 MED ORDER — LANOXIN 125 MCG PO TABS: 0.1250 mg | ORAL_TABLET | Freq: Every day | ORAL | 0 refills | Status: DC

## 2016-09-04 NOTE — Telephone Encounter (Signed)
Ok to change back to DAW for lanoxin.  Not sure if Mickel Baas will need to do any prior auth or not.

## 2016-09-04 NOTE — Telephone Encounter (Signed)
Spoke with pt- she reports that she will get a 30 days supply. Mary [name]

## 2016-09-04 NOTE — Telephone Encounter (Signed)
Pt requesting authorization to change Digoxin 0.125 mg script back to Lanoxin 0.125 mg because pt said she feels certain that Digoxin is causing severe dry, puffy lips. Pt wants a call back about the symptoms she is having and let her know if the script is changed.

## 2016-09-04 NOTE — Telephone Encounter (Signed)
LM for pt to CB. Rx changed to DAW for Lanoxin. Victorino December

## 2016-09-13 DIAGNOSIS — N312 Flaccid neuropathic bladder, not elsewhere classified: Secondary | ICD-10-CM | POA: Diagnosis not present

## 2016-09-13 DIAGNOSIS — N3020 Other chronic cystitis without hematuria: Secondary | ICD-10-CM | POA: Diagnosis not present

## 2016-09-14 ENCOUNTER — Other Ambulatory Visit: Payer: Medicare Other

## 2016-09-14 DIAGNOSIS — S52124A Nondisplaced fracture of head of right radius, initial encounter for closed fracture: Secondary | ICD-10-CM | POA: Diagnosis not present

## 2016-09-14 DIAGNOSIS — L249 Irritant contact dermatitis, unspecified cause: Secondary | ICD-10-CM | POA: Diagnosis not present

## 2016-09-14 DIAGNOSIS — L309 Dermatitis, unspecified: Secondary | ICD-10-CM | POA: Diagnosis not present

## 2016-09-21 ENCOUNTER — Ambulatory Visit: Payer: Medicare Other | Admitting: Oncology

## 2016-09-21 ENCOUNTER — Ambulatory Visit: Payer: Medicare Other

## 2016-09-28 DIAGNOSIS — N312 Flaccid neuropathic bladder, not elsewhere classified: Secondary | ICD-10-CM | POA: Diagnosis not present

## 2016-09-28 DIAGNOSIS — S52124D Nondisplaced fracture of head of right radius, subsequent encounter for closed fracture with routine healing: Secondary | ICD-10-CM | POA: Diagnosis not present

## 2016-10-03 ENCOUNTER — Other Ambulatory Visit (HOSPITAL_BASED_OUTPATIENT_CLINIC_OR_DEPARTMENT_OTHER): Payer: Medicare Other

## 2016-10-03 DIAGNOSIS — Z853 Personal history of malignant neoplasm of breast: Secondary | ICD-10-CM

## 2016-10-03 DIAGNOSIS — C50411 Malignant neoplasm of upper-outer quadrant of right female breast: Secondary | ICD-10-CM

## 2016-10-03 DIAGNOSIS — M810 Age-related osteoporosis without current pathological fracture: Secondary | ICD-10-CM

## 2016-10-03 LAB — COMPREHENSIVE METABOLIC PANEL
ALT: 16 U/L (ref 0–55)
ANION GAP: 8 meq/L (ref 3–11)
AST: 17 U/L (ref 5–34)
Albumin: 3.9 g/dL (ref 3.5–5.0)
Alkaline Phosphatase: 81 U/L (ref 40–150)
BUN: 11.7 mg/dL (ref 7.0–26.0)
CHLORIDE: 101 meq/L (ref 98–109)
CO2: 32 meq/L — AB (ref 22–29)
Calcium: 10.6 mg/dL — ABNORMAL HIGH (ref 8.4–10.4)
Creatinine: 0.8 mg/dL (ref 0.6–1.1)
EGFR: 64 mL/min/{1.73_m2} — AB (ref >90)
GLUCOSE: 90 mg/dL (ref 70–140)
POTASSIUM: 4.5 meq/L (ref 3.5–5.1)
SODIUM: 141 meq/L (ref 136–145)
Total Bilirubin: 0.63 mg/dL (ref 0.20–1.20)
Total Protein: 7.2 g/dL (ref 6.4–8.3)

## 2016-10-03 LAB — CBC WITH DIFFERENTIAL/PLATELET
BASO%: 0.7 % (ref 0.0–2.0)
Basophils Absolute: 0 10*3/uL (ref 0.0–0.1)
EOS ABS: 0.2 10*3/uL (ref 0.0–0.5)
EOS%: 3.5 % (ref 0.0–7.0)
HCT: 40.4 % (ref 34.8–46.6)
HGB: 13 g/dL (ref 11.6–15.9)
LYMPH%: 26.2 % (ref 14.0–49.7)
MCH: 28.6 pg (ref 25.1–34.0)
MCHC: 32.2 g/dL (ref 31.5–36.0)
MCV: 88.8 fL (ref 79.5–101.0)
MONO#: 0.5 10*3/uL (ref 0.1–0.9)
MONO%: 8.4 % (ref 0.0–14.0)
NEUT#: 3.7 10*3/uL (ref 1.5–6.5)
NEUT%: 61.2 % (ref 38.4–76.8)
PLATELETS: 220 10*3/uL (ref 145–400)
RBC: 4.55 10*6/uL (ref 3.70–5.45)
RDW: 14.9 % — ABNORMAL HIGH (ref 11.2–14.5)
WBC: 6.1 10*3/uL (ref 3.9–10.3)
lymph#: 1.6 10*3/uL (ref 0.9–3.3)

## 2016-10-05 LAB — VITAMIN D 25 HYDROXY (VIT D DEFICIENCY, FRACTURES): VIT D 25 HYDROXY: 61.3 ng/mL (ref 30.0–100.0)
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
## 2016-10-10 ENCOUNTER — Ambulatory Visit (HOSPITAL_BASED_OUTPATIENT_CLINIC_OR_DEPARTMENT_OTHER): Payer: Medicare Other | Admitting: Oncology

## 2016-10-10 ENCOUNTER — Ambulatory Visit (HOSPITAL_BASED_OUTPATIENT_CLINIC_OR_DEPARTMENT_OTHER): Payer: Medicare Other

## 2016-10-10 VITALS — BP 177/69 | HR 80 | Temp 98.8°F | Resp 18 | Ht 62.0 in | Wt 121.1 lb

## 2016-10-10 DIAGNOSIS — C50411 Malignant neoplasm of upper-outer quadrant of right female breast: Secondary | ICD-10-CM

## 2016-10-10 DIAGNOSIS — I10 Essential (primary) hypertension: Secondary | ICD-10-CM

## 2016-10-10 DIAGNOSIS — E782 Mixed hyperlipidemia: Secondary | ICD-10-CM

## 2016-10-10 DIAGNOSIS — R002 Palpitations: Secondary | ICD-10-CM

## 2016-10-10 DIAGNOSIS — Z853 Personal history of malignant neoplasm of breast: Secondary | ICD-10-CM | POA: Diagnosis not present

## 2016-10-10 DIAGNOSIS — M818 Other osteoporosis without current pathological fracture: Secondary | ICD-10-CM

## 2016-10-10 DIAGNOSIS — N312 Flaccid neuropathic bladder, not elsewhere classified: Secondary | ICD-10-CM

## 2016-10-10 DIAGNOSIS — Z170 Estrogen receptor positive status [ER+]: Secondary | ICD-10-CM

## 2016-10-10 DIAGNOSIS — M810 Age-related osteoporosis without current pathological fracture: Secondary | ICD-10-CM

## 2016-10-10 MED ORDER — DENOSUMAB 60 MG/ML ~~LOC~~ SOLN
60.0000 mg | Freq: Once | SUBCUTANEOUS | Status: AC
  Administered 2016-10-10: 60 mg via SUBCUTANEOUS
  Filled 2016-10-10: qty 1

## 2016-10-10 NOTE — Patient Instructions (Signed)
Denosumab injection  What is this medicine?  DENOSUMAB (den oh sue mab) slows bone breakdown. Prolia is used to treat osteoporosis in women after menopause and in men. Xgeva is used to prevent bone fractures and other bone problems caused by cancer bone metastases. Xgeva is also used to treat giant cell tumor of the bone.  This medicine may be used for other purposes; ask your health care provider or pharmacist if you have questions.  What should I tell my health care provider before I take this medicine?  They need to know if you have any of these conditions:  -dental disease  -eczema  -infection or history of infections  -kidney disease or on dialysis  -low blood calcium or vitamin D  -malabsorption syndrome  -scheduled to have surgery or tooth extraction  -taking medicine that contains denosumab  -thyroid or parathyroid disease  -an unusual reaction to denosumab, other medicines, foods, dyes, or preservatives  -pregnant or trying to get pregnant  -breast-feeding  How should I use this medicine?  This medicine is for injection under the skin. It is given by a health care professional in a hospital or clinic setting.  If you are getting Prolia, a special MedGuide will be given to you by the pharmacist with each prescription and refill. Be sure to read this information carefully each time.  For Prolia, talk to your pediatrician regarding the use of this medicine in children. Special care may be needed. For Xgeva, talk to your pediatrician regarding the use of this medicine in children. While this drug may be prescribed for children as young as 13 years for selected conditions, precautions do apply.  Overdosage: If you think you have taken too much of this medicine contact a poison control center or emergency room at once.  NOTE: This medicine is only for you. Do not share this medicine with others.  What if I miss a dose?  It is important not to miss your dose. Call your doctor or health care professional if you are  unable to keep an appointment.  What may interact with this medicine?  Do not take this medicine with any of the following medications:  -other medicines containing denosumab  This medicine may also interact with the following medications:  -medicines that suppress the immune system  -medicines that treat cancer  -steroid medicines like prednisone or cortisone  This list may not describe all possible interactions. Give your health care provider a list of all the medicines, herbs, non-prescription drugs, or dietary supplements you use. Also tell them if you smoke, drink alcohol, or use illegal drugs. Some items may interact with your medicine.  What should I watch for while using this medicine?  Visit your doctor or health care professional for regular checks on your progress. Your doctor or health care professional may order blood tests and other tests to see how you are doing.  Call your doctor or health care professional if you get a cold or other infection while receiving this medicine. Do not treat yourself. This medicine may decrease your body's ability to fight infection.  You should make sure you get enough calcium and vitamin D while you are taking this medicine, unless your doctor tells you not to. Discuss the foods you eat and the vitamins you take with your health care professional.  See your dentist regularly. Brush and floss your teeth as directed. Before you have any dental work done, tell your dentist you are receiving this medicine.  Do   not become pregnant while taking this medicine or for 5 months after stopping it. Women should inform their doctor if they wish to become pregnant or think they might be pregnant. There is a potential for serious side effects to an unborn child. Talk to your health care professional or pharmacist for more information.  What side effects may I notice from receiving this medicine?  Side effects that you should report to your doctor or health care professional as soon as  possible:  -allergic reactions like skin rash, itching or hives, swelling of the face, lips, or tongue  -breathing problems  -chest pain  -fast, irregular heartbeat  -feeling faint or lightheaded, falls  -fever, chills, or any other sign of infection  -muscle spasms, tightening, or twitches  -numbness or tingling  -skin blisters or bumps, or is dry, peels, or red  -slow healing or unexplained pain in the mouth or jaw  -unusual bleeding or bruising  Side effects that usually do not require medical attention (Report these to your doctor or health care professional if they continue or are bothersome.):  -muscle pain  -stomach upset, gas  This list may not describe all possible side effects. Call your doctor for medical advice about side effects. You may report side effects to FDA at 1-800-FDA-1088.  Where should I keep my medicine?  This medicine is only given in a clinic, doctor's office, or other health care setting and will not be stored at home.  NOTE: This sheet is a summary. It may not cover all possible information. If you have questions about this medicine, talk to your doctor, pharmacist, or health care provider.      2016, Elsevier/Gold Standard. (2011-09-04 12:37:47)

## 2016-10-19 DIAGNOSIS — S52124D Nondisplaced fracture of head of right radius, subsequent encounter for closed fracture with routine healing: Secondary | ICD-10-CM | POA: Diagnosis not present

## 2016-10-26 DIAGNOSIS — R338 Other retention of urine: Secondary | ICD-10-CM | POA: Diagnosis not present

## 2016-11-02 DIAGNOSIS — M1712 Unilateral primary osteoarthritis, left knee: Secondary | ICD-10-CM | POA: Diagnosis not present

## 2016-11-15 DIAGNOSIS — L309 Dermatitis, unspecified: Secondary | ICD-10-CM | POA: Diagnosis not present

## 2016-11-15 DIAGNOSIS — L661 Lichen planopilaris: Secondary | ICD-10-CM | POA: Diagnosis not present

## 2016-11-15 DIAGNOSIS — D1801 Hemangioma of skin and subcutaneous tissue: Secondary | ICD-10-CM | POA: Diagnosis not present

## 2016-11-15 DIAGNOSIS — L821 Other seborrheic keratosis: Secondary | ICD-10-CM | POA: Diagnosis not present

## 2016-11-15 DIAGNOSIS — L570 Actinic keratosis: Secondary | ICD-10-CM | POA: Diagnosis not present

## 2016-11-15 DIAGNOSIS — Z85828 Personal history of other malignant neoplasm of skin: Secondary | ICD-10-CM | POA: Diagnosis not present

## 2016-11-16 DIAGNOSIS — M1712 Unilateral primary osteoarthritis, left knee: Secondary | ICD-10-CM | POA: Diagnosis not present

## 2016-11-22 DIAGNOSIS — H353122 Nonexudative age-related macular degeneration, left eye, intermediate dry stage: Secondary | ICD-10-CM | POA: Diagnosis not present

## 2016-11-22 DIAGNOSIS — H40013 Open angle with borderline findings, low risk, bilateral: Secondary | ICD-10-CM | POA: Diagnosis not present

## 2016-11-22 DIAGNOSIS — H5213 Myopia, bilateral: Secondary | ICD-10-CM | POA: Diagnosis not present

## 2016-11-22 DIAGNOSIS — H26493 Other secondary cataract, bilateral: Secondary | ICD-10-CM | POA: Diagnosis not present

## 2016-11-23 DIAGNOSIS — N3512 Postinfective urethral stricture, not elsewhere classified, female: Secondary | ICD-10-CM | POA: Diagnosis not present

## 2016-11-24 ENCOUNTER — Telehealth: Payer: Self-pay | Admitting: Family Medicine

## 2016-11-24 DIAGNOSIS — M1712 Unilateral primary osteoarthritis, left knee: Secondary | ICD-10-CM | POA: Diagnosis not present

## 2016-11-24 MED ORDER — DIGOXIN 125 MCG PO TABS: 0.1250 mg | ORAL_TABLET | Freq: Every day | ORAL | 1 refills | Status: DC

## 2016-11-24 NOTE — Telephone Encounter (Signed)
Generic refill sent to her pharmacy

## 2016-11-24 NOTE — Telephone Encounter (Signed)
Pt need a refill on Lanoxin soon and would like for her Lanoxin script to be changed back to Digoxin. She said she has found out that she is not allergic to Digoxin and Lanoxin is expensive as well.

## 2016-11-30 DIAGNOSIS — M1712 Unilateral primary osteoarthritis, left knee: Secondary | ICD-10-CM | POA: Diagnosis not present

## 2016-12-12 NOTE — Progress Notes (Signed)
Chief Complaint  Patient presents with  . Medicare Wellness    fasting(blood in lab) AWV -(does not want to do pelvic). No concerns.     Mary [name] is a 83 y.o. female who presents for annual wellness visit and follow-up on chronic medical conditions.    Hypertension and palpitation follow-up: Blood pressures are not checked elsewhere. Per review of flowsheet in chart, BP's tend to fluctuate some. Denies dizziness, headaches, chest pain. Denies side effects of medications. Palpitations are well controlled on her current med regimen. At her last visit  She reported that Lanoxin cost went up to $456 for 90d supply. She tried the generic and has been tolerating it just fine. She contacted [place] recently to have the generic refilled.  Hyperlipidemia follow-up: Patient is reportedly following a low cholesterol diet. Compliant with medications and denies medication side effects.She is no longer taking fish oil, as she had a hard time swallowing it, and seems similar to her AREDs. She continues to have 1-2, up to 3 [place] peppermint patties each day.  Lab Results  Component Value Date   CHOL 186 05/25/2016   HDL 63 05/25/2016   LDLCALC 82 05/25/2016   TRIG 203 (H) 05/25/2016   CHOLHDL 3.0 05/25/2016    Urinary retention--she has suprapubic catheter. She drains it 3-5 times/daily. She sees Kim at the urologist's office once a month to have the catheter changed. Denies pain, abnormal urine. Denies cloudy or bloody urine, no odor.   She was started on Prolia injections by Dr. [name] in July 2014.  She got her last Prolia injection in July. She is taking calcium and vitamin D. Not getting any weight-bearing exercise. DEXA last done 01/2015, showing osteoporosis. Due for repeat DEXA in November. Calcium level was noted to be slightly elevated on labs done for oncologist in July.  She cut back on her caltrate from 2x/d to 1/d after her July visit (mostly related to issues with constipation). Eats a  lot of dairy products.  H/o breast cancer--now over 10 years from diagnosis/treatment. She still sees Dr. [name] yearly, last in July. She reports he did a breast exam, and declines one today.  Left knee pain--s/p 3 injections in the left knee, and pain is better.  Leg gave out once while her daughter was visiting (June). She was given a knee brace to use when walking a lot. Hasn't needed to use this in a few weeks.  She really doesn't get much exercise.  Alopecia: under the care of Dr. Delman Cheadle; last injection was 2 months ago, still using topical medications. Dry lips--treated by Dr. Delman Cheadle with some topical medication--has gotten much better.  Immunization History  Administered Date(s) Administered  . Influenza Split 12/01/2010, 12/18/2012, 01/02/2015, 01/02/2015  . Influenza-Unspecified 12/08/2013  . Pneumococcal Conjugate-13 04/15/2014  . Pneumococcal Polysaccharide-23 12/19/2002, 09/04/2011  . Td 11/19/2003  . Tdap 09/04/2011   Last Pap smear: 2013 Last mammogram: 01/2016 Last colonoscopy: 2013, told no further was needed Last DEXA: 01/2015 T-3.1 at R fem neck Dentist: twice yearly Ophtho: yearly Exercise: minimal. No weight-bearing exercise   Other doctors caring for patient include: Dr. Diamantina Monks Dr. Geronimo Running (retired--likely changing to Dr. Junious Silk at same practice) Dr. Olga Coaster (just prn, for cerumen removal) Dr. Lutricia Horsfall Dr. Mare Ferrari Dr. Magrinat--oncologist Dr. Belinda Block Dr. Dorothe Pea  Depression screen: negative Fall screen: tripped on steps outside a restaurant, fell forward and had a hairline fracture to her right wrist (June or July of this year) Functional Status survey: occasional trouble with names;  uses left knee brace rarely (not needing since injections to the knee).   End of Life Discussion: Patient hasa living will and medical power of attorney  Past Medical History:  Diagnosis Date  . Alopecia   .  Ashkenazi Jewish ancestry   . External hemorrhoid   . Foley catheter in place   . Heart palpitations no cardiologist--  monitored by pcp   per pt "has been on verapamil and lanoxin for years and no palpitations for a very long time"  . History of acute bronchitis    dx 12-21-2014  finished ZPAK  . History of adenomatous polyp of colon    2003  . History of basal cell carcinoma excision    2006  left nasolabial fold  . History of benign colon tumor    2007  --  HIGH GRADE HYPERPLASTIC ,  S/P  LEFT HEMICOLECTOMY  . History of breast cancer ONCOLOGIST-  DR [name]--  antiestogen therapy with femera completed 12/  2012--  no recurrence   dx 10/  2007  right breast Invasive DCIS, grade 2, Stage 2A, pT2  pN0 pMX,  (ER 100% /PR 3% +),  HER +3) with 1 metastatic axill node---  s/p  right mastectomy   . Hypotonic bladder UROLOGIST-  DR [name]  . Mixed hyperlipidemia   . OA (osteoarthritis)    hip  . Osteoporosis    DEXA 01/2009; T-3.2 L fem neck, -2.4 L radius  . Urinary retention    02-13-2015    Past Surgical History:  Procedure Laterality Date  . APPENDECTOMY  age 71  . BREAST LUMPECTOMY WITH NEEDLE LOCALIZATION AND AXILLARY SENTINEL LYMPH NODE BX Right 01-23-2006  . CARPAL TUNNEL RELEASE Right 04-24-2007   and Pulley Release index and small finger  . CATARACT EXTRACTION W/ INTRAOCULAR LENS  IMPLANT, BILATERAL  left 1991  &  right 1993  . COLONOSCOPY  last one 2013  . CYSTOSCOPY N/A 07/12/2015   Procedure: CYSTOSCOPY;  Surgeon: [name], MD;  Location: [facility];  Service: Urology;  Laterality: N/A;  . CYSTOSCOPY WITH BIOPSY N/A 03/01/2015   Procedure: CYSTOSCOPY WITH TAUBER BIOPSY OF 1CM RIGHT LATERAL BLADDER WALL AND CAUTERIZATION OF BIOPSY SITE ;  Surgeon: [name], MD;  Location: [facility];  Service: Urology;  Laterality: N/A;  . INSERTION OF SUPRAPUBIC CATHETER N/A 07/12/2015   Procedure: INSERTION OF SUPRAPUBIC CATHETER;   Surgeon: [name], MD;  Location: [facility];  Service: Urology;  Laterality: N/A;  . LAPAROSCOPIC ASSISTED LEFT HEMICOLECTOMY  06/  2007   high grade hyperplastic mass  . MASTECTOMY Right 02-28-2006  . PARTIAL KNEE ARTHROPLASTY Right 01/24/2013   Procedure: RIGHT KNEE MEDIAL UNICOMPARTMENTAL ARTHROPLASTY;  Surgeon: Gearlean Alf, MD;  Location: [facility];  Service: Orthopedics;  Laterality: Right;  . PULLEY RELEASE LEFT INDEX AND SMALL FINGER  08-07-2007  . TONSILLECTOMY  age 72    Social History   Social History  . Marital status: Widowed    Spouse name: N/A  . Number of children: 3  . Years of education: N/A   Occupational History  . Not on file.   Social History Main Topics  . Smoking status: Never Smoker  . Smokeless tobacco: Never Used  . Alcohol use Yes     Comment: Very seldom  . Drug use: No  . Sexual activity: Not on file   Other Topics Concern  . Not on file   Social History Narrative  Children live in [place]. She has 1 son and 2 daughters; brother and nephew live in [place] (brother stays with his son in [place], has a place in [place]). Widowed '94    Family History  Problem Relation Age of Onset  . Hypertension Mother   . Stroke Father   . Diabetes Father   . Hyperlipidemia Brother   . Atrial fibrillation Brother   . Hyperlipidemia Son   . Breast cancer Paternal Aunt        dx 80s; deceased early 6s  . Breast cancer Cousin        paternal first cousin  . Colon cancer Cousin        paternal first cousin; age dx 77s    Outpatient Encounter Prescriptions as of 12/13/2016  Medication Sig Note  . atorvastatin (LIPITOR) 40 MG tablet TAKE 1 TABLET BY MOUTH EVERY EVENING   . Calcium Carbonate (CALTRATE 600 PO) Take 1 tablet by mouth daily.   . Cholecalciferol (VITAMIN D3) 2000 UNITS TABS Take 2,000 Units by mouth daily.   Marland Kitchen denosumab (PROLIA) 60 MG/ML SOLN injection Inject 60 mg into the skin See admin instructions. Administer in  upper arm, thigh, or abdomen 12/13/2016: Every 6 months  . digoxin (LANOXIN) 0.125 MG tablet Take 1 tablet (0.125 mg total) by mouth daily.   . Multiple Vitamins-Minerals (CENTRUM ADULTS PO) Take 1 tablet by mouth daily.   . Multiple Vitamins-Minerals (ICAPS AREDS 2) CAPS Take 1 capsule by mouth 2 (two) times daily.   . polyethylene glycol (MIRALAX / GLYCOLAX) packet Take 17 g by mouth daily as needed for moderate constipation.  12/13/2016: Uses it about every other day (forgets to take it daily)  . verapamil (CALAN-SR) 240 MG CR tablet TAKE 1 TABLET(240 MG) BY MOUTH DAILY   . acetaminophen (TYLENOL) 500 MG tablet Take 1,000 mg by mouth every 6 (six) hours as needed.   . loratadine (CLARITIN) 10 MG tablet Take 10 mg by mouth daily as needed for allergies.  12/13/2016: Uses claritin or zyrtec, rarely  . PROCTOZONE-HC 2.5 % rectal cream APPLY EXTERNALLY TO THE AFFECTED AREA TWICE DAILY (Patient not taking: Reported on 07/24/2016)   . [DISCONTINUED] mupirocin cream (BACTROBAN) 2 % Apply 1 application topically 3 (three) times daily. (Patient not taking: Reported on 08/28/2016)   . [DISCONTINUED] ondansetron (ZOFRAN ODT) 4 MG disintegrating tablet Take 1 tablet (4 mg total) by mouth every 8 (eight) hours as needed for nausea or vomiting. (Patient not taking: Reported on 05/25/2016)   . [DISCONTINUED] triamcinolone cream (KENALOG) 0.1 % Apply 1 application topically 2 (two) times daily. (Patient not taking: Reported on 08/28/2016)    No facility-administered encounter medications on file as of 12/13/2016.     Allergies  Allergen Reactions  . Azithromycin Nausea Only  . Arimidex [Anastrozole] Rash  . Avelox [Moxifloxacin Hcl In Nacl] Nausea Only and Rash  . Bactrim Nausea Only and Rash  . Biaxin [Clarithromycin] Nausea Only and Rash  . Macrobid [Nitrofurantoin] Nausea Only and Rash  . Penicillins Nausea Only and Rash    Has patient had a PCN reaction causing immediate rash, facial/tongue/throat swelling,  SOB or lightheadedness with hypotension: no Has patient had a PCN reaction causing severe rash involving mucus membranes or skin necrosis: no Has patient had a PCN reaction that required hospitalization no Has patient had a PCN reaction occurring within the last 10 years no If all of the above answers are "NO", then may proceed with Cephalosporin use.   Marland Kitchen  Sulfa Antibiotics Nausea And Vomiting and Rash    ROS: The patient denies fever, headaches, vision changes, decreased hearing, ear pain, sore throat, breast concerns, chest pain, palpitations, dizziness, syncope, dyspnea on exertion, cough, swelling, nausea, vomiting, diarrhea, abdominal pain, melena, hematochezia, indigestion/heartburn, hematuria, dysuria, vaginal bleeding, discharge, odor or itch, genital lesions, joint pains (other than knee), numbness, tingling, weakness, tremor, suspicious skin lesions, depression, anxiety, abnormal bleeding/bruising, or enlarged lymph nodes. Constipation --controlled by Miralax, stool softeners and prune juice. Has periodic flares of hemorrhoids; cream helps.  Hemorrhoids bleed slightly, staining on her pad in underwear. Sleeps well, up once or twice to empty the bladder tube. Alopecia--stable overall.   PHYSICAL EXAM:  BP (!) 150/88 (BP Location: Left Arm, Patient Position: Sitting, Cuff Size: Normal)   Pulse 76   Ht 5\' 2"  (1.575 m)   Wt 122 lb (55.3 kg)   BMI 22.31 kg/m   140/84 on repeat by MD  Wt Readings from Last 3 Encounters:  12/13/16 122 lb (55.3 kg)  10/10/16 121 lb 1.6 oz (54.9 kg)  08/28/16 120 lb 9.6 oz (54.7 kg)   She declines breast exam (Dr. [name] did it in July); declines pelvic exam. Declines changing into gown today.  General Appearance:   Alert, cooperative, no distress, appears somewhat younger than stated age  Head:   Normocephalic, without obvious abnormality, atraumatic  Eyes:   PERRL, conjunctiva/corneas clear, EOM's intact, fundi benign  Ears:    Normal external ear canals and TM's.  Nose:  Nares normal, mucosa normal, no drainage or sinus tenderness  Throat:  Lips, mucosa, and tongue normal; teeth and gums normal. +torus pallatini  Neck:  Supple, no lymphadenopathy; thyroid: no enlargement/tenderness/nodules; no carotid bruit or JVD  Back:  Spine nontender, no curvature, ROM normal, no CVA tenderness  Lungs:   Clear to auscultation bilaterally without wheezes, rales or ronchi; respirations unlabored  Chest Wall:   No tenderness or deformity  Heart:   Regular rate and rhythm, S1 and S2 normal, no murmur, rub or gallop  Breast Exam:   Exam declined by patient (deferred to oncologist)  Abdomen:   Soft, non-tender, nondistended, normoactive bowel sounds, no masses, no hepatosplenomegaly. suprapubic catheter in place, tubing taped over lower abdomen.  Very slight reaction to tape in certain locations.  Genitalia:  Declined by patient  Rectal:  Declined by patient  Extremities:  No clubbing, cyanosis or edema.   Pulses:  2+ and symmetric all extremities  Skin:  Skin color, texture, turgor normal, no rashes or lesions  Lymph nodes:  Cervical, supraclavicular, and axillary nodes normal  Neurologic:  CNII-XII intact, normal strength, sensation and gait; reflexes 2+ and symmetric throughout   Psych: Normal mood, affect, hygiene and grooming    Chemistry      Component Value Date/Time   NA 141 10/03/2016 1107   K 4.5 10/03/2016 1107   CL 106 04/04/2015 0356   CL 102 03/01/2012 0912   CO2 32 (H) 10/03/2016 1107   BUN 11.7 10/03/2016 1107   CREATININE 0.8 10/03/2016 1107      Component Value Date/Time   CALCIUM 10.6 (H) 10/03/2016 1107   ALKPHOS 81 10/03/2016 1107   AST 17 10/03/2016 1107   ALT 16 10/03/2016 1107   BILITOT 0.63 10/03/2016 1107     Fasting glucose 90  Lab Results  Component Value Date   WBC 6.1 10/03/2016   HGB 13.0 10/03/2016   HCT 40.4  10/03/2016   MCV 88.8 10/03/2016   PLT  220 10/03/2016   Vitamin D-OH level of 61 (09/2016)   ASSESSMENT/PLAN:  Medicare annual wellness visit, subsequent  Mixed hyperlipidemia - elevated TG on last check; reviewed proper diet. Had blood drawn this morning when fasting--recheck lipids.  - Plan: atorvastatin (LIPITOR) 40 MG tablet, Lipid panel  Essential hypertension, benign - borderline/mildly elevated today, fluctuates. Cont current meds. Low sodium diet, daily exercise encouraged  Osteoporosis without current pathological fracture, unspecified osteoporosis type - Continue Prolia q64mos. Encouraged wt bearing exercise. Cont Vit D; Discussed Ca--to get 1200-1500mg  TOTAL, including diet; cont 1/d supplement  Atony of bladder - has suprapubic catheter, without complications  Need for influenza vaccination - Plan: Flu vaccine HIGH DOSE PF (Fluzone High dose)  External hemorrhoids - Declined exam; still bleeds some--reviewed treatment for constipation (high fiber diet, water, miralax more frequently); Anusol HC BID prn - Plan: hydrocortisone (PROCTOZONE-HC) 2.5 % rectal cream  Palpitations - well controlled on low dose digoxin and verapamil - Plan: verapamil (CALAN-SR) 240 MG CR tablet  Hypercalcemia - on recent labs; she has since reduced her calcium supplementation. Recheck today - Plan: Calcium, Albumin   Discussed monthly self breast exams and yearly mammograms; at least 30 minutes of aerobic activity at least 5 days/week, weight-bearing exercise 2x/week; proper sunscreen use reviewed; healthy diet, including goals of calcium and vitamin D intake and alcohol recommendations (less than or equal to 1 drink/day) reviewed; regular seatbelt use; changing batteries in smoke detectors. Immunization recommendations discussed: high dose flu shot yearly, given today. Shingrix recommended, discussed. Colonoscopy recommendations reviewed, UTD. DEXA due 01/2017 (ordered by her onc)  Patient has  Living Will and healthcare POA; Full Code, full care.  F/u 6 mos   Medicare Attestation I have personally reviewed: The patient's medical and social history Their use of alcohol, tobacco or illicit drugs Their current medications and supplements The patient's functional ability including ADLs,fall risks, home safety risks, cognitive, and hearing and visual impairment Diet and physical activities Evidence for depression or mood disorders  The patient's weight, height, and BMI have been recorded in the chart.  I have made referrals, counseling, and provided education to the patient based on review of the above and I have provided the patient with a written personalized care plan for preventive services.

## 2016-12-13 ENCOUNTER — Ambulatory Visit (INDEPENDENT_AMBULATORY_CARE_PROVIDER_SITE_OTHER): Payer: Medicare Other | Admitting: Family Medicine

## 2016-12-13 ENCOUNTER — Encounter: Payer: Self-pay | Admitting: Family Medicine

## 2016-12-13 VITALS — BP 140/84 | HR 76 | Ht 62.0 in | Wt 122.0 lb

## 2016-12-13 DIAGNOSIS — R002 Palpitations: Secondary | ICD-10-CM | POA: Diagnosis not present

## 2016-12-13 DIAGNOSIS — I10 Essential (primary) hypertension: Secondary | ICD-10-CM

## 2016-12-13 DIAGNOSIS — Z0000 Encounter for general adult medical examination without abnormal findings: Secondary | ICD-10-CM

## 2016-12-13 DIAGNOSIS — Z23 Encounter for immunization: Secondary | ICD-10-CM | POA: Diagnosis not present

## 2016-12-13 DIAGNOSIS — M810 Age-related osteoporosis without current pathological fracture: Secondary | ICD-10-CM

## 2016-12-13 DIAGNOSIS — N312 Flaccid neuropathic bladder, not elsewhere classified: Secondary | ICD-10-CM

## 2016-12-13 DIAGNOSIS — E782 Mixed hyperlipidemia: Secondary | ICD-10-CM

## 2016-12-13 DIAGNOSIS — K644 Residual hemorrhoidal skin tags: Secondary | ICD-10-CM | POA: Diagnosis not present

## 2016-12-13 MED ORDER — ATORVASTATIN CALCIUM 40 MG PO TABS: 40.0000 mg | ORAL_TABLET | Freq: Every evening | ORAL | 1 refills | Status: DC

## 2016-12-13 MED ORDER — VERAPAMIL HCL ER 240 MG PO TBCR: EXTENDED_RELEASE_TABLET | ORAL | 3 refills | Status: DC

## 2016-12-13 MED ORDER — HYDROCORTISONE 2.5 % RE CREA: TOPICAL_CREAM | RECTAL | 0 refills | Status: DC

## 2016-12-13 NOTE — Patient Instructions (Signed)
  HEALTH MAINTENANCE RECOMMENDATIONS:  It is recommended that you get at least 30 minutes of aerobic exercise at least 5 days/week (for weight loss, you may need as much as 60-90 minutes). This can be any activity that gets your heart rate up. This can be divided in 10-15 minute intervals if needed, but try and build up your endurance at least once a week.  Weight bearing exercise is also recommended twice weekly.  Eat a healthy diet with lots of vegetables, fruits and fiber.  "Colorful" foods have a lot of vitamins (ie green vegetables, tomatoes, red peppers, etc).  Limit sweet tea, regular sodas and alcoholic beverages, all of which has a lot of calories and sugar.  Up to 1 alcoholic drink daily may be beneficial for women (unless trying to lose weight, watch sugars).  Drink a lot of water.  Calcium recommendations are 1200-1500 mg daily (1500 mg for postmenopausal women or women without ovaries), and vitamin D 1000 IU daily.  This should be obtained from diet and/or supplements (vitamins), and calcium should not be taken all at once, but in divided doses.  Monthly self breast exams and yearly mammograms for women over the age of 40 is recommended.  Sunscreen of at least SPF 30 should be used on all sun-exposed parts of the skin when outside between the hours of 10 am and 4 pm (not just when at beach or pool, but even with exercise, golf, tennis, and yard work!)  Use a sunscreen that says "broad spectrum" so it covers both UVA and UVB rays, and make sure to reapply every 1-2 hours.  Remember to change the batteries in your smoke detectors when changing your clock times in the spring and fall.  Use your seat belt every time you are in a car, and please drive safely and not be distracted with cell phones and texting while driving.   Mary [name] , Thank you for taking time to come for your Medicare Wellness Visit. I appreciate your ongoing commitment to your health goals. Please review the following  plan we discussed and let me know if I can assist you in the future.   These are the goals we discussed: Goals    None      This is a list of the screening recommended for you and due dates:  Health Maintenance  Topic Date Due  . Flu Shot  10/18/2016  . Tetanus Vaccine  09/03/2021  . DEXA scan (bone density measurement)  Completed  . Pneumonia vaccines  Completed   Your next bone density test and mammogram are due in November. You were given your flu shot today.  I recommend getting the new shingles vaccine (Shingrix). You will need to check with your insurance to see if it is covered, and if covered by Medicare Part D, you need to get from the pharmacy rather than our office.  It is a series of 2 injections, spaced 2 months apart.

## 2016-12-15 LAB — TEST AUTHORIZATION

## 2016-12-15 LAB — LIPID PANEL
Cholesterol: 209 mg/dL — ABNORMAL HIGH (ref <200)
HDL: 71 mg/dL (ref >50)
LDL Cholesterol (Calc): 106 mg/dL (calc) — ABNORMAL HIGH
Non-HDL Cholesterol (Calc): 138 mg/dL (calc) — ABNORMAL HIGH (ref <130)
Total CHOL/HDL Ratio: 2.9 (calc) (ref <5.0)
Triglycerides: 199 mg/dL — ABNORMAL HIGH (ref <150)

## 2016-12-15 LAB — CALCIUM: CALCIUM: 9.1 mg/dL (ref 8.6–10.4)

## 2016-12-15 LAB — ALBUMIN: ALBUMIN MSPROF: 4.2 g/dL (ref 3.6–5.1)

## 2016-12-15 LAB — EXTRA LAV TOP TUBE

## 2016-12-21 ENCOUNTER — Ambulatory Visit: Payer: Medicare Other | Admitting: Family Medicine

## 2016-12-21 DIAGNOSIS — N312 Flaccid neuropathic bladder, not elsewhere classified: Secondary | ICD-10-CM | POA: Diagnosis not present

## 2017-01-18 DIAGNOSIS — N312 Flaccid neuropathic bladder, not elsewhere classified: Secondary | ICD-10-CM | POA: Diagnosis not present

## 2017-02-15 DIAGNOSIS — N312 Flaccid neuropathic bladder, not elsewhere classified: Secondary | ICD-10-CM | POA: Diagnosis not present

## 2017-02-20 DIAGNOSIS — M810 Age-related osteoporosis without current pathological fracture: Secondary | ICD-10-CM | POA: Diagnosis not present

## 2017-02-20 DIAGNOSIS — M8589 Other specified disorders of bone density and structure, multiple sites: Secondary | ICD-10-CM | POA: Diagnosis not present

## 2017-02-20 DIAGNOSIS — Z853 Personal history of malignant neoplasm of breast: Secondary | ICD-10-CM | POA: Diagnosis not present

## 2017-02-20 DIAGNOSIS — Z1231 Encounter for screening mammogram for malignant neoplasm of breast: Secondary | ICD-10-CM | POA: Diagnosis not present

## 2017-02-20 LAB — HM DEXA SCAN

## 2017-02-20 LAB — HM MAMMOGRAPHY

## 2017-02-21 DIAGNOSIS — N312 Flaccid neuropathic bladder, not elsewhere classified: Secondary | ICD-10-CM | POA: Diagnosis not present

## 2017-02-28 ENCOUNTER — Encounter: Payer: Self-pay | Admitting: *Deleted

## 2017-02-28 DIAGNOSIS — H6123 Impacted cerumen, bilateral: Secondary | ICD-10-CM | POA: Diagnosis not present

## 2017-02-28 DIAGNOSIS — R0982 Postnasal drip: Secondary | ICD-10-CM | POA: Diagnosis not present

## 2017-03-01 DIAGNOSIS — R0982 Postnasal drip: Secondary | ICD-10-CM | POA: Insufficient documentation

## 2017-03-05 ENCOUNTER — Other Ambulatory Visit: Payer: Self-pay | Admitting: Family Medicine

## 2017-03-05 NOTE — Telephone Encounter (Signed)
Is this okay to refill? Dr Tomi Bamberger usually refills once a year around this time for this patient if you look at the med history.

## 2017-03-08 DIAGNOSIS — N312 Flaccid neuropathic bladder, not elsewhere classified: Secondary | ICD-10-CM | POA: Diagnosis not present

## 2017-04-05 DIAGNOSIS — N312 Flaccid neuropathic bladder, not elsewhere classified: Secondary | ICD-10-CM | POA: Diagnosis not present

## 2017-04-12 ENCOUNTER — Inpatient Hospital Stay: Payer: Medicare Other

## 2017-04-12 ENCOUNTER — Inpatient Hospital Stay: Payer: Medicare Other | Attending: Oncology

## 2017-04-12 VITALS — BP 175/90 | HR 82 | Temp 98.0°F | Resp 18

## 2017-04-12 DIAGNOSIS — I10 Essential (primary) hypertension: Secondary | ICD-10-CM

## 2017-04-12 DIAGNOSIS — N312 Flaccid neuropathic bladder, not elsewhere classified: Secondary | ICD-10-CM

## 2017-04-12 DIAGNOSIS — M818 Other osteoporosis without current pathological fracture: Secondary | ICD-10-CM

## 2017-04-12 DIAGNOSIS — M810 Age-related osteoporosis without current pathological fracture: Secondary | ICD-10-CM | POA: Diagnosis not present

## 2017-04-12 DIAGNOSIS — C50411 Malignant neoplasm of upper-outer quadrant of right female breast: Secondary | ICD-10-CM

## 2017-04-12 DIAGNOSIS — E782 Mixed hyperlipidemia: Secondary | ICD-10-CM

## 2017-04-12 DIAGNOSIS — R002 Palpitations: Secondary | ICD-10-CM

## 2017-04-12 DIAGNOSIS — Z853 Personal history of malignant neoplasm of breast: Secondary | ICD-10-CM | POA: Insufficient documentation

## 2017-04-12 DIAGNOSIS — Z170 Estrogen receptor positive status [ER+]: Secondary | ICD-10-CM

## 2017-04-12 LAB — COMPREHENSIVE METABOLIC PANEL
ALT: 16 U/L (ref 0–55)
AST: 18 U/L (ref 5–34)
Albumin: 3.9 g/dL (ref 3.5–5.0)
Alkaline Phosphatase: 79 U/L (ref 40–150)
Anion gap: 9 (ref 3–11)
BILIRUBIN TOTAL: 0.7 mg/dL (ref 0.2–1.2)
BUN: 15 mg/dL (ref 7–26)
CO2: 30 mmol/L — ABNORMAL HIGH (ref 22–29)
CREATININE: 0.81 mg/dL (ref 0.60–1.10)
Calcium: 10 mg/dL (ref 8.4–10.4)
Chloride: 101 mmol/L (ref 98–109)
Glucose, Bld: 86 mg/dL (ref 70–140)
POTASSIUM: 4.5 mmol/L (ref 3.3–4.7)
Sodium: 140 mmol/L (ref 136–145)
TOTAL PROTEIN: 7.7 g/dL (ref 6.4–8.3)

## 2017-04-12 LAB — CBC WITH DIFFERENTIAL/PLATELET
BASOS ABS: 0 10*3/uL (ref 0.0–0.1)
Basophils Relative: 1 %
EOS ABS: 0.2 10*3/uL (ref 0.0–0.5)
Eosinophils Relative: 3 %
HCT: 40.5 % (ref 34.8–46.6)
Hemoglobin: 13.1 g/dL (ref 11.6–15.9)
Lymphocytes Relative: 24 %
Lymphs Abs: 1.6 10*3/uL (ref 0.9–3.3)
MCH: 28.5 pg (ref 25.1–34.0)
MCHC: 32.3 g/dL (ref 31.5–36.0)
MCV: 88 fL (ref 79.5–101.0)
Monocytes Absolute: 0.7 10*3/uL (ref 0.1–0.9)
Monocytes Relative: 11 %
NEUTROS PCT: 61 %
Neutro Abs: 4.1 10*3/uL (ref 1.5–6.5)
PLATELETS: 243 10*3/uL (ref 145–400)
RBC: 4.6 MIL/uL (ref 3.70–5.45)
RDW: 15.2 % (ref 11.2–16.1)
WBC: 6.7 10*3/uL (ref 3.9–10.3)

## 2017-04-12 MED ORDER — DENOSUMAB 60 MG/ML ~~LOC~~ SOLN
60.0000 mg | Freq: Once | SUBCUTANEOUS | Status: AC
  Administered 2017-04-12: 60 mg via SUBCUTANEOUS

## 2017-04-12 NOTE — Patient Instructions (Signed)
Denosumab injection  What is this medicine?  DENOSUMAB (den oh sue mab) slows bone breakdown. Prolia is used to treat osteoporosis in women after menopause and in men. Xgeva is used to prevent bone fractures and other bone problems caused by cancer bone metastases. Xgeva is also used to treat giant cell tumor of the bone.  This medicine may be used for other purposes; ask your health care provider or pharmacist if you have questions.  What should I tell my health care provider before I take this medicine?  They need to know if you have any of these conditions:  -dental disease  -eczema  -infection or history of infections  -kidney disease or on dialysis  -low blood calcium or vitamin D  -malabsorption syndrome  -scheduled to have surgery or tooth extraction  -taking medicine that contains denosumab  -thyroid or parathyroid disease  -an unusual reaction to denosumab, other medicines, foods, dyes, or preservatives  -pregnant or trying to get pregnant  -breast-feeding  How should I use this medicine?  This medicine is for injection under the skin. It is given by a health care professional in a hospital or clinic setting.  If you are getting Prolia, a special MedGuide will be given to you by the pharmacist with each prescription and refill. Be sure to read this information carefully each time.  For Prolia, talk to your pediatrician regarding the use of this medicine in children. Special care may be needed. For Xgeva, talk to your pediatrician regarding the use of this medicine in children. While this drug may be prescribed for children as young as 13 years for selected conditions, precautions do apply.  Overdosage: If you think you have taken too much of this medicine contact a poison control center or emergency room at once.  NOTE: This medicine is only for you. Do not share this medicine with others.  What if I miss a dose?  It is important not to miss your dose. Call your doctor or health care professional if you are  unable to keep an appointment.  What may interact with this medicine?  Do not take this medicine with any of the following medications:  -other medicines containing denosumab  This medicine may also interact with the following medications:  -medicines that suppress the immune system  -medicines that treat cancer  -steroid medicines like prednisone or cortisone  This list may not describe all possible interactions. Give your health care provider a list of all the medicines, herbs, non-prescription drugs, or dietary supplements you use. Also tell them if you smoke, drink alcohol, or use illegal drugs. Some items may interact with your medicine.  What should I watch for while using this medicine?  Visit your doctor or health care professional for regular checks on your progress. Your doctor or health care professional may order blood tests and other tests to see how you are doing.  Call your doctor or health care professional if you get a cold or other infection while receiving this medicine. Do not treat yourself. This medicine may decrease your body's ability to fight infection.  You should make sure you get enough calcium and vitamin D while you are taking this medicine, unless your doctor tells you not to. Discuss the foods you eat and the vitamins you take with your health care professional.  See your dentist regularly. Brush and floss your teeth as directed. Before you have any dental work done, tell your dentist you are receiving this medicine.  Do   not become pregnant while taking this medicine or for 5 months after stopping it. Women should inform their doctor if they wish to become pregnant or think they might be pregnant. There is a potential for serious side effects to an unborn child. Talk to your health care professional or pharmacist for more information.  What side effects may I notice from receiving this medicine?  Side effects that you should report to your doctor or health care professional as soon as  possible:  -allergic reactions like skin rash, itching or hives, swelling of the face, lips, or tongue  -breathing problems  -chest pain  -fast, irregular heartbeat  -feeling faint or lightheaded, falls  -fever, chills, or any other sign of infection  -muscle spasms, tightening, or twitches  -numbness or tingling  -skin blisters or bumps, or is dry, peels, or red  -slow healing or unexplained pain in the mouth or jaw  -unusual bleeding or bruising  Side effects that usually do not require medical attention (Report these to your doctor or health care professional if they continue or are bothersome.):  -muscle pain  -stomach upset, gas  This list may not describe all possible side effects. Call your doctor for medical advice about side effects. You may report side effects to FDA at 1-800-FDA-1088.  Where should I keep my medicine?  This medicine is only given in a clinic, doctor's office, or other health care setting and will not be stored at home.  NOTE: This sheet is a summary. It may not cover all possible information. If you have questions about this medicine, talk to your doctor, pharmacist, or health care provider.      2016, Elsevier/Gold Standard. (2011-09-04 12:37:47)

## 2017-05-03 DIAGNOSIS — N312 Flaccid neuropathic bladder, not elsewhere classified: Secondary | ICD-10-CM | POA: Diagnosis not present

## 2017-05-29 ENCOUNTER — Other Ambulatory Visit: Payer: Self-pay | Admitting: Family Medicine

## 2017-05-29 DIAGNOSIS — L089 Local infection of the skin and subcutaneous tissue, unspecified: Secondary | ICD-10-CM

## 2017-05-30 DIAGNOSIS — N312 Flaccid neuropathic bladder, not elsewhere classified: Secondary | ICD-10-CM | POA: Diagnosis not present

## 2017-05-30 DIAGNOSIS — M1712 Unilateral primary osteoarthritis, left knee: Secondary | ICD-10-CM | POA: Diagnosis not present

## 2017-05-30 NOTE — Telephone Encounter (Signed)
Is this okay to refill? 

## 2017-06-27 DIAGNOSIS — R3915 Urgency of urination: Secondary | ICD-10-CM | POA: Diagnosis not present

## 2017-06-27 DIAGNOSIS — N312 Flaccid neuropathic bladder, not elsewhere classified: Secondary | ICD-10-CM | POA: Diagnosis not present

## 2017-07-01 ENCOUNTER — Other Ambulatory Visit: Payer: Self-pay | Admitting: Family Medicine

## 2017-07-02 NOTE — Telephone Encounter (Signed)
She was supposed to schedule a 6 month med check (when she left in Sept).  She has no future visits scheduled.  Needs med check now (and AWV set for September/October)

## 2017-07-02 NOTE — Telephone Encounter (Signed)
Spoke with patient and she could not schedule med check right now as she is going away for [place] and when she gets back she will call me to schedule med check. I did get her AWV scheduled for 01/10/48 @ 1:45-she asked if she can come in for labs prior to that (I told her we would figure it out when she comes in for the med check). She wants to know if you can still fill the medication for #90 as it is cheaper.

## 2017-07-02 NOTE — Telephone Encounter (Signed)
Is this ok?

## 2017-07-19 DIAGNOSIS — M1712 Unilateral primary osteoarthritis, left knee: Secondary | ICD-10-CM | POA: Diagnosis not present

## 2017-07-20 DIAGNOSIS — M5136 Other intervertebral disc degeneration, lumbar region: Secondary | ICD-10-CM | POA: Diagnosis not present

## 2017-07-20 DIAGNOSIS — M545 Low back pain: Secondary | ICD-10-CM | POA: Diagnosis not present

## 2017-07-24 DIAGNOSIS — R338 Other retention of urine: Secondary | ICD-10-CM | POA: Diagnosis not present

## 2017-07-24 DIAGNOSIS — M62838 Other muscle spasm: Secondary | ICD-10-CM | POA: Diagnosis not present

## 2017-07-24 DIAGNOSIS — N312 Flaccid neuropathic bladder, not elsewhere classified: Secondary | ICD-10-CM | POA: Diagnosis not present

## 2017-07-24 DIAGNOSIS — M6281 Muscle weakness (generalized): Secondary | ICD-10-CM | POA: Diagnosis not present

## 2017-07-25 DIAGNOSIS — N312 Flaccid neuropathic bladder, not elsewhere classified: Secondary | ICD-10-CM | POA: Diagnosis not present

## 2017-07-26 DIAGNOSIS — M1712 Unilateral primary osteoarthritis, left knee: Secondary | ICD-10-CM | POA: Diagnosis not present

## 2017-07-29 NOTE — Progress Notes (Signed)
Chief Complaint  Patient presents with  . Hypertension    patient is non-fasting today for this visit. Has been having really bad arthitis in her lower back so when she was getting her knee injections at ortho she saw someone for this and was given some prednisone-states it has helped some.     Hypertension and palpitation follow-up: Blood pressures are not checked elsewhere. Denies dizziness, headaches, chest pain. Denies side effects of medications. Palpitations are well controlled on her current med regimen.  BP's have been noted to fluctuate in past: BP Readings from Last 3 Encounters:  04/12/17 (!) 175/90  12/13/16 140/84  10/10/16 (!) 177/69   Hyperlipidemia follow-up: Patient is reportedly following a low cholesterol diet. Compliant with medications and denies medication side effects.She is no longer taking fish oil, as she had a hard time swallowing it; continues on AREDs. She continues to have 2 [place] peppermint patties each day.  She is not fasting today. Lab Results  Component Value Date   CHOL 209 (H) 12/13/2016   HDL 71 12/13/2016   LDLCALC 106 (H) 12/13/2016   TRIG 199 (H) 12/13/2016   CHOLHDL 2.9 12/13/2016    Urinary retention--she has suprapubic catheter. She drains it about every 3 hours.  She goes to the urologist's office once a month to have the catheter changed. She sees Dr. Junious Silk now that  Dr. [name] retired.  Denies pain, abnormal urine. Denies cloudy or bloody urine, no odor.   She was started on Prolia injections by Dr. [name] in July 2014.  She got her last Prolia injection in January. She is taking calcium and vitamin D. Not getting any weight-bearing exercise. DEXA last done 02/2017.  T-3.0 at L wrist; stable at L wrist and R fem neck, improved at L fem neck.  She cut back on her caltrate from 2x/d to 1/d due to constipation and elevated Calcium. Serum calcium was normal on last check by oncologist in January. She continues to eat a lot of dairy  products (cottage, cheese, yogurt, some ice cream).  H/o breast cancer--now over 10 years from diagnosis/treatment. She still sees Dr. [name] yearly, denies any breast concerns or complaints.  Left knee pain--s/p 3 injections in the left knee, and pain was better. Knee pain recurred and she went back for another set of injections. She is scheduled for the 3rd of this series later this week. Pain is improving. Hasn't need to wear knee brace.  A few weeks ago while she was in [place] she developed severe low back pain. She saw Dr. Gladstone Lighter for her knee, discussed her back. Had x-ray, found to have arthritis, and given a prednisone taper (rx'd 5/3). This has helped a lot, pain is much is much better.  Never had any numbness, weakness or pain radiating into the leg.  PMH, PSH, SH reviewed  Outpatient Encounter Medications as of 07/30/2017  Medication Sig Note  . atorvastatin (LIPITOR) 40 MG tablet Take 1 tablet (40 mg total) by mouth every evening.   . Calcium Carbonate (CALTRATE 600 PO) Take 1 tablet by mouth daily.   . Cholecalciferol (VITAMIN D3) 2000 UNITS TABS Take 2,000 Units by mouth daily.   Marland Kitchen denosumab (PROLIA) 60 MG/ML SOLN injection Inject 60 mg into the skin See admin instructions. Administer in upper arm, thigh, or abdomen 12/13/2016: Every 6 months  . DIGOX 125 MCG tablet TAKE 1 TABLET BY MOUTH DAILY   . loratadine (CLARITIN) 10 MG tablet Take 10 mg by mouth daily as  needed for allergies.  07/30/2017: Taking claritin daily  . Multiple Vitamins-Minerals (CENTRUM ADULTS PO) Take 1 tablet by mouth daily.   . Multiple Vitamins-Minerals (ICAPS AREDS 2) CAPS Take 1 capsule by mouth 2 (two) times daily.   . polyethylene glycol (MIRALAX / GLYCOLAX) packet Take 17 g by mouth daily as needed for moderate constipation.  12/13/2016: Uses it about every other day (forgets to take it daily)  . predniSONE (DELTASONE) 10 MG tablet Take 10 mg by mouth daily with breakfast.  07/30/2017: 21 tablet taper   . VALIUM 2 MG tablet TAKE 1 TABLET BY MOUTH EVERY 8 HOURS FOR MUSCLE SPASMS 07/30/2017: Rarely uses  . verapamil (CALAN-SR) 240 MG CR tablet TAKE 1 TABLET(240 MG) BY MOUTH DAILY   . acetaminophen (TYLENOL) 500 MG tablet Take 1,000 mg by mouth every 6 (six) hours as needed.   . hydrocortisone (PROCTOZONE-HC) 2.5 % rectal cream APPLY EXTERNALLY TO THE AFFECTED AREA TWICE DAILY (Patient not taking: Reported on 07/30/2017)   . [DISCONTINUED] mupirocin cream (BACTROBAN) 2 % APPLY EXTERNALLY TO THE AFFECTED AREA THREE TIMES DAILY    No facility-administered encounter medications on file as of 07/30/2017.    Allergies  Allergen Reactions  . Azithromycin Nausea Only  . Arimidex [Anastrozole] Rash  . Avelox [Moxifloxacin Hcl In Nacl] Nausea Only and Rash  . Bactrim Nausea Only and Rash  . Biaxin [Clarithromycin] Nausea Only and Rash  . Macrobid [Nitrofurantoin] Nausea Only and Rash  . Penicillins Nausea Only and Rash    Has patient had a PCN reaction causing immediate rash, facial/tongue/throat swelling, SOB or lightheadedness with hypotension: no Has patient had a PCN reaction causing severe rash involving mucus membranes or skin necrosis: no Has patient had a PCN reaction that required hospitalization no Has patient had a PCN reaction occurring within the last 10 years no If all of the above answers are "NO", then may proceed with Cephalosporin use.   . Sulfa Antibiotics Nausea And Vomiting and Rash    ROS: The patient denies fever, headaches, vision or hearing changes (had wax removed in December by Dr. Constance Holster), URI or allergy symptoms. Denies chest pain, palpitations, dizziness, syncope, dyspnea on exertion. Denies edema. Denies GI or GU complaints.  No numbness, tingling, weakness, tremor, suspicious skin lesions, depression, anxiety, abnormal bleeding/bruising, or enlarged lymph nodes. Very intermittent dysphagia--feels like a lump if she drinks very cold water. (She no longer has problems  with taking calcium, as long as she doesn't take it with ice cold water.)  L knee pain--improving with the injections (scheduled for another later this week). Back pain resolved with prednisone. Constipation --controlled by Miralax.Hemorrhoids are large, but not bothersome, just slight pink on pads, no true bleeding   PHYSICAL EXAM:  BP 130/86   Pulse 80   Ht 5' 2"  (1.575 m)   Wt 125 lb 6.4 oz (56.9 kg)   BMI 22.94 kg/m   Wt Readings from Last 3 Encounters:  07/30/17 125 lb 6.4 oz (56.9 kg)  12/13/16 122 lb (55.3 kg)  10/10/16 121 lb 1.6 oz (54.9 kg)    Well developed, pleasant female in no distress HEENT: PERRL, EOMI, conjunctiva clear, OP clear Neck: no lymphadenopathy, thyromegaly or mass. No carotid bruit. Heart: regular rate and rhythm without murmur, rub, gallop or ectopy Lungs: clear bilaterally Back: no spinal or CVA tenderness Abdomen: soft, nontender, nondistended, no mass Extremities: no edema, normal pulses Neuro: alert and oriented, cranial nerves, strength and gait normal Psych: normal mood, affect, hygiene  and grooming    ASSESSMENT/PLAN:  Essential hypertension, benign - controlled  Mixed hyperlipidemia - elevated TG in past. Reviewed diet; consider re-try fish oil (different capules or other formulation) - Plan: atorvastatin (LIPITOR) 40 MG tablet, Lipid panel  Atony of bladder - doing well with catheter, without complications  Osteoporosis without current pathological fracture, unspecified osteoporosis type - on Prolia; osteoporosis persists at wrist. Discussed Ca, D, add in weight-bearing exercise  Palpitations - controlled on verapamil and low dose digoxin (stable on this regimen for years)  Medication monitoring encounter - Plan: Lipid panel, Digoxin level  Long-term use of high-risk medication - Plan: Digoxin level    Dig level, lipids due, to return fasting CBC and c-met normal in January   Consider physical therapy if back pain  recurs.  Try and get some weight-bearing exercise in your upper body at least 2x/week.  Your wrist still has osteoporosis, and we want to strengthen the bones before they break (do this in addition to the calcium and D).  Try and limit the fried foods and sugar in your diet (portion control of the ice cream, chocolate, sweets, etc); consider looking for an alternative form of fish oil (capsules were too hard to swallow)), which also help lower the triglycerides.

## 2017-07-30 ENCOUNTER — Ambulatory Visit (INDEPENDENT_AMBULATORY_CARE_PROVIDER_SITE_OTHER): Payer: Medicare Other | Admitting: Family Medicine

## 2017-07-30 ENCOUNTER — Encounter: Payer: Self-pay | Admitting: Family Medicine

## 2017-07-30 VITALS — BP 130/86 | HR 80 | Ht 62.0 in | Wt 125.4 lb

## 2017-07-30 DIAGNOSIS — N312 Flaccid neuropathic bladder, not elsewhere classified: Secondary | ICD-10-CM | POA: Diagnosis not present

## 2017-07-30 DIAGNOSIS — Z5181 Encounter for therapeutic drug level monitoring: Secondary | ICD-10-CM | POA: Diagnosis not present

## 2017-07-30 DIAGNOSIS — Z79899 Other long term (current) drug therapy: Secondary | ICD-10-CM | POA: Diagnosis not present

## 2017-07-30 DIAGNOSIS — E782 Mixed hyperlipidemia: Secondary | ICD-10-CM | POA: Diagnosis not present

## 2017-07-30 DIAGNOSIS — M810 Age-related osteoporosis without current pathological fracture: Secondary | ICD-10-CM

## 2017-07-30 DIAGNOSIS — R002 Palpitations: Secondary | ICD-10-CM | POA: Diagnosis not present

## 2017-07-30 DIAGNOSIS — I10 Essential (primary) hypertension: Secondary | ICD-10-CM

## 2017-07-30 MED ORDER — ATORVASTATIN CALCIUM 40 MG PO TABS: 40.0000 mg | ORAL_TABLET | Freq: Every evening | ORAL | 1 refills | Status: DC

## 2017-07-30 NOTE — Patient Instructions (Signed)
Consider physical therapy if back pain recurs.  Try and get some weight-bearing exercise in your upper body at least 2x/week.  Your wrist still has osteoporosis, and we want to strengthen the bones before they break (do this in addition to the calcium and D).  Try and limit the fried foods and sugar in your diet (portion control of the ice cream, chocolate, sweets, etc); consider looking for an alternative form of fish oil (capsules were too hard to swallow)), which also help lower the triglycerides.

## 2017-08-02 DIAGNOSIS — M1712 Unilateral primary osteoarthritis, left knee: Secondary | ICD-10-CM | POA: Diagnosis not present

## 2017-08-07 ENCOUNTER — Other Ambulatory Visit: Payer: Medicare Other

## 2017-08-07 DIAGNOSIS — Z79899 Other long term (current) drug therapy: Secondary | ICD-10-CM

## 2017-08-07 DIAGNOSIS — Z5181 Encounter for therapeutic drug level monitoring: Secondary | ICD-10-CM

## 2017-08-07 DIAGNOSIS — E782 Mixed hyperlipidemia: Secondary | ICD-10-CM | POA: Diagnosis not present

## 2017-08-08 LAB — LIPID PANEL
CHOLESTEROL TOTAL: 229 mg/dL — AB (ref 100–199)
Chol/HDL Ratio: 3 ratio (ref 0.0–4.4)
HDL: 76 mg/dL (ref >39)
LDL CALC: 125 mg/dL — AB (ref 0–99)
TRIGLYCERIDES: 141 mg/dL (ref 0–149)
VLDL Cholesterol Cal: 28 mg/dL (ref 5–40)

## 2017-08-08 LAB — DIGOXIN LEVEL: DIGOXIN, SERUM: 0.7 ng/mL (ref 0.5–0.9)

## 2017-08-15 ENCOUNTER — Ambulatory Visit (INDEPENDENT_AMBULATORY_CARE_PROVIDER_SITE_OTHER): Payer: Medicare Other | Admitting: Family Medicine

## 2017-08-15 ENCOUNTER — Encounter: Payer: Self-pay | Admitting: Family Medicine

## 2017-08-15 VITALS — BP 146/90 | HR 96 | Temp 97.7°F | Wt 123.0 lb

## 2017-08-15 DIAGNOSIS — R059 Cough, unspecified: Secondary | ICD-10-CM

## 2017-08-15 DIAGNOSIS — R05 Cough: Secondary | ICD-10-CM | POA: Diagnosis not present

## 2017-08-15 NOTE — Progress Notes (Signed)
   Subjective:    Patient ID: Mary [name], female    DOB: [birth year], 84 y.o.   MRN: [id number]  HPI She complains of a one-week history of cough that is worse at night but no fever, chills, sore throat, earache, nausea, vomiting.  She has tried an old prescription of codeine with minimal relief.  She does not give a good history for acid reflux.   Review of Systems     Objective:   Physical Exam Alert and in no distress. Tympanic membranes and canals are normal. Pharyngeal area is normal. Neck is supple without adenopathy or thyromegaly. Cardiac exam shows a regular sinus rhythm without murmurs or gallops. Lungs are clear to auscultation.       Assessment & Plan:  Cough Recommend she use Nexium or Prilosec for 1 week and if no improvement, set up an appointment for follow-up.

## 2017-08-15 NOTE — Patient Instructions (Signed)
Try either Prilosec or Nexium at night for the next week and see what that will do for the cough

## 2017-08-22 DIAGNOSIS — N312 Flaccid neuropathic bladder, not elsewhere classified: Secondary | ICD-10-CM | POA: Diagnosis not present

## 2017-08-22 DIAGNOSIS — H6502 Acute serous otitis media, left ear: Secondary | ICD-10-CM | POA: Diagnosis not present

## 2017-08-22 DIAGNOSIS — H6123 Impacted cerumen, bilateral: Secondary | ICD-10-CM | POA: Diagnosis not present

## 2017-08-29 DIAGNOSIS — N312 Flaccid neuropathic bladder, not elsewhere classified: Secondary | ICD-10-CM | POA: Diagnosis not present

## 2017-09-05 DIAGNOSIS — N312 Flaccid neuropathic bladder, not elsewhere classified: Secondary | ICD-10-CM | POA: Diagnosis not present

## 2017-09-07 DIAGNOSIS — M1712 Unilateral primary osteoarthritis, left knee: Secondary | ICD-10-CM | POA: Diagnosis not present

## 2017-09-07 DIAGNOSIS — Z96659 Presence of unspecified artificial knee joint: Secondary | ICD-10-CM | POA: Diagnosis not present

## 2017-09-19 DIAGNOSIS — N312 Flaccid neuropathic bladder, not elsewhere classified: Secondary | ICD-10-CM | POA: Diagnosis not present

## 2017-09-30 ENCOUNTER — Other Ambulatory Visit: Payer: Self-pay | Admitting: Family Medicine
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
## 2017-10-10 ENCOUNTER — Telehealth: Payer: Self-pay | Admitting: Oncology

## 2017-10-10 ENCOUNTER — Inpatient Hospital Stay: Payer: Medicare Other

## 2017-10-10 ENCOUNTER — Inpatient Hospital Stay: Payer: Medicare Other | Attending: Oncology | Admitting: Oncology

## 2017-10-10 VITALS — BP 172/83 | HR 82 | Temp 98.5°F | Resp 18 | Ht 62.0 in | Wt 124.3 lb

## 2017-10-10 DIAGNOSIS — Z853 Personal history of malignant neoplasm of breast: Secondary | ICD-10-CM | POA: Diagnosis not present

## 2017-10-10 DIAGNOSIS — N312 Flaccid neuropathic bladder, not elsewhere classified: Secondary | ICD-10-CM

## 2017-10-10 DIAGNOSIS — Z170 Estrogen receptor positive status [ER+]: Secondary | ICD-10-CM

## 2017-10-10 DIAGNOSIS — E782 Mixed hyperlipidemia: Secondary | ICD-10-CM

## 2017-10-10 DIAGNOSIS — I10 Essential (primary) hypertension: Secondary | ICD-10-CM

## 2017-10-10 DIAGNOSIS — C50411 Malignant neoplasm of upper-outer quadrant of right female breast: Secondary | ICD-10-CM

## 2017-10-10 DIAGNOSIS — M810 Age-related osteoporosis without current pathological fracture: Secondary | ICD-10-CM

## 2017-10-10 DIAGNOSIS — M818 Other osteoporosis without current pathological fracture: Secondary | ICD-10-CM

## 2017-10-10 DIAGNOSIS — R002 Palpitations: Secondary | ICD-10-CM

## 2017-10-10 LAB — COMPREHENSIVE METABOLIC PANEL
ALK PHOS: 80 U/L (ref 38–126)
ALT: 14 U/L (ref 0–44)
AST: 13 U/L — AB (ref 15–41)
Albumin: 4 g/dL (ref 3.5–5.0)
Anion gap: 9 (ref 5–15)
BUN: 12 mg/dL (ref 8–23)
CALCIUM: 9.8 mg/dL (ref 8.9–10.3)
CO2: 30 mmol/L (ref 22–32)
CREATININE: 0.78 mg/dL (ref 0.44–1.00)
Chloride: 100 mmol/L (ref 98–111)
GFR calc Af Amer: >60 mL/min (ref >60)
GFR calc non Af Amer: >60 mL/min (ref >60)
Glucose, Bld: 102 mg/dL — ABNORMAL HIGH (ref 70–99)
Potassium: 3.9 mmol/L (ref 3.5–5.1)
SODIUM: 139 mmol/L (ref 135–145)
Total Bilirubin: 0.4 mg/dL (ref 0.3–1.2)
Total Protein: 7.5 g/dL (ref 6.5–8.1)

## 2017-10-10 LAB — CBC WITH DIFFERENTIAL/PLATELET
BASOS PCT: 1 %
Basophils Absolute: 0.1 10*3/uL (ref 0.0–0.1)
EOS ABS: 0.2 10*3/uL (ref 0.0–0.5)
Eosinophils Relative: 3 %
HEMATOCRIT: 38 % (ref 34.8–46.6)
Hemoglobin: 12.6 g/dL (ref 11.6–15.9)
LYMPHS ABS: 1.3 10*3/uL (ref 0.9–3.3)
Lymphocytes Relative: 20 %
MCH: 29 pg (ref 25.1–34.0)
MCHC: 33.2 g/dL (ref 31.5–36.0)
MCV: 87.3 fL (ref 79.5–101.0)
MONOS PCT: 10 %
Monocytes Absolute: 0.6 10*3/uL (ref 0.1–0.9)
Neutro Abs: 4.2 10*3/uL (ref 1.5–6.5)
Neutrophils Relative %: 66 %
Platelets: 242 10*3/uL (ref 145–400)
RBC: 4.35 MIL/uL (ref 3.70–5.45)
RDW: 15.1 % — ABNORMAL HIGH (ref 11.2–14.5)
WBC: 6.3 10*3/uL (ref 3.9–10.3)

## 2017-10-10 MED ORDER — DENOSUMAB 60 MG/ML ~~LOC~~ SOLN
60.0000 mg | Freq: Once | SUBCUTANEOUS | Status: AC
  Administered 2017-10-10: 60 mg via SUBCUTANEOUS

## 2017-10-10 NOTE — Telephone Encounter (Signed)
Gave avs and calendar ° °

## 2017-10-10 NOTE — Patient Instructions (Signed)
Denosumab injection  What is this medicine?  DENOSUMAB (den oh sue mab) slows bone breakdown. Prolia is used to treat osteoporosis in women after menopause and in men. Xgeva is used to prevent bone fractures and other bone problems caused by cancer bone metastases. Xgeva is also used to treat giant cell tumor of the bone.  This medicine may be used for other purposes; ask your health care provider or pharmacist if you have questions.  What should I tell my health care provider before I take this medicine?  They need to know if you have any of these conditions:  -dental disease  -eczema  -infection or history of infections  -kidney disease or on dialysis  -low blood calcium or vitamin D  -malabsorption syndrome  -scheduled to have surgery or tooth extraction  -taking medicine that contains denosumab  -thyroid or parathyroid disease  -an unusual reaction to denosumab, other medicines, foods, dyes, or preservatives  -pregnant or trying to get pregnant  -breast-feeding  How should I use this medicine?  This medicine is for injection under the skin. It is given by a health care professional in a hospital or clinic setting.  If you are getting Prolia, a special MedGuide will be given to you by the pharmacist with each prescription and refill. Be sure to read this information carefully each time.  For Prolia, talk to your pediatrician regarding the use of this medicine in children. Special care may be needed. For Xgeva, talk to your pediatrician regarding the use of this medicine in children. While this drug may be prescribed for children as young as 13 years for selected conditions, precautions do apply.  Overdosage: If you think you have taken too much of this medicine contact a poison control center or emergency room at once.  NOTE: This medicine is only for you. Do not share this medicine with others.  What if I miss a dose?  It is important not to miss your dose. Call your doctor or health care professional if you are  unable to keep an appointment.  What may interact with this medicine?  Do not take this medicine with any of the following medications:  -other medicines containing denosumab  This medicine may also interact with the following medications:  -medicines that suppress the immune system  -medicines that treat cancer  -steroid medicines like prednisone or cortisone  This list may not describe all possible interactions. Give your health care provider a list of all the medicines, herbs, non-prescription drugs, or dietary supplements you use. Also tell them if you smoke, drink alcohol, or use illegal drugs. Some items may interact with your medicine.  What should I watch for while using this medicine?  Visit your doctor or health care professional for regular checks on your progress. Your doctor or health care professional may order blood tests and other tests to see how you are doing.  Call your doctor or health care professional if you get a cold or other infection while receiving this medicine. Do not treat yourself. This medicine may decrease your body's ability to fight infection.  You should make sure you get enough calcium and vitamin D while you are taking this medicine, unless your doctor tells you not to. Discuss the foods you eat and the vitamins you take with your health care professional.  See your dentist regularly. Brush and floss your teeth as directed. Before you have any dental work done, tell your dentist you are receiving this medicine.  Do   not become pregnant while taking this medicine or for 5 months after stopping it. Women should inform their doctor if they wish to become pregnant or think they might be pregnant. There is a potential for serious side effects to an unborn child. Talk to your health care professional or pharmacist for more information.  What side effects may I notice from receiving this medicine?  Side effects that you should report to your doctor or health care professional as soon as  possible:  -allergic reactions like skin rash, itching or hives, swelling of the face, lips, or tongue  -breathing problems  -chest pain  -fast, irregular heartbeat  -feeling faint or lightheaded, falls  -fever, chills, or any other sign of infection  -muscle spasms, tightening, or twitches  -numbness or tingling  -skin blisters or bumps, or is dry, peels, or red  -slow healing or unexplained pain in the mouth or jaw  -unusual bleeding or bruising  Side effects that usually do not require medical attention (Report these to your doctor or health care professional if they continue or are bothersome.):  -muscle pain  -stomach upset, gas  This list may not describe all possible side effects. Call your doctor for medical advice about side effects. You may report side effects to FDA at 1-800-FDA-1088.  Where should I keep my medicine?  This medicine is only given in a clinic, doctor's office, or other health care setting and will not be stored at home.  NOTE: This sheet is a summary. It may not cover all possible information. If you have questions about this medicine, talk to your doctor, pharmacist, or health care provider.      2016, Elsevier/Gold Standard. (2011-09-04 12:37:47)

## 2017-10-11 LAB — VITAMIN D 25 HYDROXY (VIT D DEFICIENCY, FRACTURES): Vit D, 25-Hydroxy: 47 ng/mL (ref 30.0–100.0)

## 2017-10-17 DIAGNOSIS — N312 Flaccid neuropathic bladder, not elsewhere classified: Secondary | ICD-10-CM | POA: Diagnosis not present

## 2017-11-09 DIAGNOSIS — M1712 Unilateral primary osteoarthritis, left knee: Secondary | ICD-10-CM | POA: Diagnosis not present

## 2017-11-12 DIAGNOSIS — N312 Flaccid neuropathic bladder, not elsewhere classified: Secondary | ICD-10-CM | POA: Diagnosis not present

## 2017-11-20 DIAGNOSIS — H5213 Myopia, bilateral: Secondary | ICD-10-CM | POA: Diagnosis not present

## 2017-11-20 DIAGNOSIS — H4423 Degenerative myopia, bilateral: Secondary | ICD-10-CM | POA: Diagnosis not present

## 2017-11-20 DIAGNOSIS — H26493 Other secondary cataract, bilateral: Secondary | ICD-10-CM | POA: Diagnosis not present

## 2017-11-20 DIAGNOSIS — H43813 Vitreous degeneration, bilateral: Secondary | ICD-10-CM | POA: Diagnosis not present

## 2017-12-05 DIAGNOSIS — L570 Actinic keratosis: Secondary | ICD-10-CM | POA: Diagnosis not present

## 2017-12-05 DIAGNOSIS — L661 Lichen planopilaris: Secondary | ICD-10-CM | POA: Diagnosis not present

## 2017-12-05 DIAGNOSIS — L821 Other seborrheic keratosis: Secondary | ICD-10-CM | POA: Diagnosis not present

## 2017-12-05 DIAGNOSIS — L658 Other specified nonscarring hair loss: Secondary | ICD-10-CM | POA: Diagnosis not present

## 2017-12-10 DIAGNOSIS — N312 Flaccid neuropathic bladder, not elsewhere classified: Secondary | ICD-10-CM | POA: Diagnosis not present

## 2017-12-24 DIAGNOSIS — Z23 Encounter for immunization: Secondary | ICD-10-CM | POA: Diagnosis not present

## 2017-12-25 ENCOUNTER — Other Ambulatory Visit: Payer: Self-pay | Admitting: Family Medicine

## 2017-12-25 DIAGNOSIS — R002 Palpitations: Secondary | ICD-10-CM

## 2017-12-27 DIAGNOSIS — N312 Flaccid neuropathic bladder, not elsewhere classified: Secondary | ICD-10-CM | POA: Diagnosis not present

## 2018-01-07 DIAGNOSIS — N312 Flaccid neuropathic bladder, not elsewhere classified: Secondary | ICD-10-CM | POA: Diagnosis not present

## 2018-01-08 NOTE — Patient Instructions (Addendum)
  HEALTH MAINTENANCE RECOMMENDATIONS:  It is recommended that you get at least 30 minutes of aerobic exercise at least 5 days/week (for weight loss, you may need as much as 60-90 minutes). This can be any activity that gets your heart rate up. This can be divided in 10-15 minute intervals if needed, but try and build up your endurance at least once a week.  Weight bearing exercise is also recommended twice weekly.  Eat a healthy diet with lots of vegetables, fruits and fiber.  "Colorful" foods have a lot of vitamins (ie green vegetables, tomatoes, red peppers, etc).  Limit sweet tea, regular sodas and alcoholic beverages, all of which has a lot of calories and sugar.  Up to 1 alcoholic drink daily may be beneficial for women (unless trying to lose weight, watch sugars).  Drink a lot of water.  Calcium recommendations are 1200-1500 mg daily (1500 mg for postmenopausal women or women without ovaries), and vitamin D 1000 IU daily.  This should be obtained from diet and/or supplements (vitamins), and calcium should not be taken all at once, but in divided doses.  Monthly self breast exams and yearly mammograms for women over the age of 37 is recommended.  Sunscreen of at least SPF 30 should be used on all sun-exposed parts of the skin when outside between the hours of 10 am and 4 pm (not just when at beach or pool, but even with exercise, golf, tennis, and yard work!)  Use a sunscreen that says "broad spectrum" so it covers both UVA and UVB rays, and make sure to reapply every 1-2 hours.  Remember to change the batteries in your smoke detectors when changing your clock times in the spring and fall.  Use your seat belt every time you are in a car, and please drive safely and not be distracted with cell phones and texting while driving.   Mary [name] , Thank you for taking time to come for your Medicare Wellness Visit. I appreciate your ongoing commitment to your health goals. Please review the following  plan we discussed and let me know if I can assist you in the future.    This is a list of the screening recommended for you and due dates:  Health Maintenance  Topic Date Due  . Flu Shot  10/18/2017  . Tetanus Vaccine  09/03/2021  . DEXA scan (bone density measurement)  Completed  . Pneumonia vaccines  Completed   Continue yearly flu shots (you got from pharmacy) Bone density test will be due again 02/2019 (ordered by Dr. [name]) Continue yearly mammograms (due 02/2018)  I recommend getting the new shingles vaccine (Shingrix). You will need to check with your insurance to see if it is covered, and if covered by Medicare Part D, you need to get from the pharmacy rather than our office.  It is a series of 2 injections, spaced 2 months apart.  Weight bearing exercise at least 2x/week is recommended, for both upper and lower body.  Your osteoporosis is the worst in your wrist, so this is important. Try and get 30 minutes daily (in 10-15 minute intervals) of exercise that gets your heart rate up, as we discussed.

## 2018-01-08 NOTE — Progress Notes (Signed)
Chief Complaint  Patient presents with  . Medicare Wellness    nonfasting AWV, does not want to do pelvic exam. No concerns except continued decreased appetite.   . Medication Refill    needs Digoxin and valium.    Mary [name] is a 84 y.o. female who presents for annual wellness visit and follow-up on chronic medical conditions.  She denies any specific complaints.  She does report that moods were a bit down the last few days, after her daughter and twin granddaughters had been visiting, and left on Sunday.  She feels like her moods are starting to improve.  She worries some about her brother, that she is not doing enough with him (currently 65, living with his son nearby).  Hypertension and palpitation follow-up: Blood pressures are not checked elsewhere. Denies dizziness, headaches, chest pain. Denies side effects of medications. Digoxin level was normal in May. Palpitations are well controlled on her current med regimen, and is tolerating generic digoxin without problems. BP's have fluctuated widely at prior visits.  BP Readings from Last 3 Encounters:  10/10/17 (!) 172/83  08/15/17 (!) 146/90  07/30/17 130/86   Hyperlipidemia follow-up: Patient is reportedly following a low cholesterol diet. Compliant with medications and denies medication side effects.She is no longer taking fish oil, as she had a hard time swallowing it; continues on AREDs. She continues to have 2 [place] peppermint patties each day.  Lab Results  Component Value Date   CHOL 229 (H) 08/07/2017   HDL 76 08/07/2017   LDLCALC 125 (H) 08/07/2017   TRIG 141 08/07/2017   CHOLHDL 3.0 08/07/2017    Urinary retention--she has suprapubic catheter. Shedrains it about every 2-3 hours. She goes to the urologist's officeonce a month to have the catheter changed.She sees Dr. Junious Silk now that  Dr. [name] retired, went earlier this month.  Denies pain, abnormal urine. Denies cloudy or bloody urine, no odor.   She was  started on Prolia injections by Dr. [name] in July 2014. Last injection was in July. She is taking calcium and vitamin D. Not getting any weight-bearing exercise. DEXA last done 02/2017.  T-3.0 at L wrist; stable at L wrist and R fem neck, improved at L fem neck. She cut back on her caltrate from 2x/d to 1/d due to constipation (also had elevated Calcium, normal on f/u testing). She continues to eat a lot of dairy products (cottage, cheese, yogurt.)  H/o breast cancer--now over 10 years from diagnosis/treatment. She still sees Dr. [name] yearly, denies any breast concerns or complaints.  Left knee pain--s/p 3 injections in the left knee, and pain was better. Knee pain recurred and she went back for another set of injections this past Spring, which didn't help.  Dr. Wynelle Link reportedly took another x-ray, told her she had bone on bone, got another cortisone injection 6 weeks ago, and her pain is significantly improved.  In the Spring she also had severe low back pain (while in [place]). Dr. Gladstone Lighter checked x-ray, found to have arthritis, and given a prednisone taper (rx'd 5/3). Never had any numbness, weakness or pain radiating into the leg. She denies any further back pain, just occasionally at her left hip with a lot of walking, relieved by Advil.  Usually Tylenol is enough.  Alopecia: under the care of Dr. Delman Cheadle. Still using topical medications, had injections last month.   Valium was last filled #30 02/2017, requesting refill today.  She still has about half the bottle left. Takes it if anxious,  such as with travel.  Immunization History  Administered Date(s) Administered  . Influenza Split 12/01/2010, 12/18/2012, 01/02/2015, 01/02/2015  . Influenza, High Dose Seasonal PF 12/13/2016  . Influenza-Unspecified 12/08/2013  . Pneumococcal Conjugate-13 04/15/2014  . Pneumococcal Polysaccharide-23 12/19/2002, 09/04/2011  . Td 11/19/2003  . Tdap 09/04/2011   Got [facility]  12/24/17 Last Pap smear: 2013 Last mammogram: 02/2017 Last colonoscopy: 2013, told no further was needed Last DEXA: 02/2017 T-3.0 at L wrist; showed improvement at L fem neck.  L wrist and R fem neck were stable  Dentist: twice yearly Ophtho: yearly Exercise: minimal. No weight-bearing exercise   Other doctors caring for patient include: Dr. Diamantina Monks Dr. Junious Silk Dr. Olga Coaster (just prn, for cerumen removal) Dr. Lutricia Horsfall Dr. Wynelle Link, ([place]-for acute visit only)--ortho Dr. Magrinat--oncologist Dr. Belinda Block Dr. Dorothe Pea  Depression screen: PHQ-2 score of 1 (several days feels worried; down just a few days after family left town) PHQ-9 score of 1 (occasionally is fatigued). Fall screen: None in the last year Functional Status survey: some trouble hearing in crowds Mini-Cog screen: normal (5)  See Epic for full questionnaires/screens  End of Life Discussion: Patient hasa living will and medical power of attorney  Past Medical History:  Diagnosis Date  . Alopecia   . Ashkenazi Jewish ancestry   . External hemorrhoid   . Foley catheter in place   . Heart palpitations no cardiologist--  monitored by pcp   per pt "has been on verapamil and lanoxin for years and no palpitations for a very long time"  . History of acute bronchitis    dx 12-21-2014  finished ZPAK  . History of adenomatous polyp of colon    2003  . History of basal cell carcinoma excision    2006  left nasolabial fold  . History of benign colon tumor    2007  --  HIGH GRADE HYPERPLASTIC ,  S/P  LEFT HEMICOLECTOMY  . History of breast cancer ONCOLOGIST-  DR [name]--  antiestogen therapy with femera completed 12/  2012--  no recurrence   dx 10/  2007  right breast Invasive DCIS, grade 2, Stage 2A, pT2  pN0 pMX,  (ER 100% /PR 3% +),  HER +3) with 1 metastatic axill node---  s/p  right mastectomy   . Hypotonic bladder UROLOGIST-  DR [name]  . Mixed hyperlipidemia   . OA  (osteoarthritis)    hip  . Osteoporosis    DEXA 01/2009; T-3.2 L fem neck, -2.4 L radius  . Urinary retention    02-13-2015    Past Surgical History:  Procedure Laterality Date  . APPENDECTOMY  age 14  . BREAST LUMPECTOMY WITH NEEDLE LOCALIZATION AND AXILLARY SENTINEL LYMPH NODE BX Right 01-23-2006  . CARPAL TUNNEL RELEASE Right 04-24-2007   and Pulley Release index and small finger  . CATARACT EXTRACTION W/ INTRAOCULAR LENS  IMPLANT, BILATERAL  left 1991  &  right 1993  . COLONOSCOPY  last one 2013  . CYSTOSCOPY N/A 07/12/2015   Procedure: CYSTOSCOPY;  Surgeon: [name], MD;  Location: [facility];  Service: Urology;  Laterality: N/A;  . CYSTOSCOPY WITH BIOPSY N/A 03/01/2015   Procedure: CYSTOSCOPY WITH TAUBER BIOPSY OF 1CM RIGHT LATERAL BLADDER WALL AND CAUTERIZATION OF BIOPSY SITE ;  Surgeon: [name], MD;  Location: [facility];  Service: Urology;  Laterality: N/A;  . INSERTION OF SUPRAPUBIC CATHETER N/A 07/12/2015   Procedure: INSERTION OF SUPRAPUBIC CATHETER;  Surgeon: [name], MD;  Location: [facility];  Service: Urology;  Laterality: N/A;  . LAPAROSCOPIC ASSISTED LEFT HEMICOLECTOMY  06/  2007   high grade hyperplastic mass  . MASTECTOMY Right 02-28-2006  . PARTIAL KNEE ARTHROPLASTY Right 01/24/2013   Procedure: RIGHT KNEE MEDIAL UNICOMPARTMENTAL ARTHROPLASTY;  Surgeon: Gearlean Alf, MD;  Location: [facility];  Service: Orthopedics;  Laterality: Right;  . PULLEY RELEASE LEFT INDEX AND SMALL FINGER  08-07-2007  . TONSILLECTOMY  age 18    Social History   Socioeconomic History  . Marital status: Widowed    Spouse name: Not on file  . Number of children: 3  . Years of education: Not on file  . Highest education level: Not on file  Occupational History  . Not on file  Social Needs  . Financial resource strain: Not on file  . Food insecurity:    Worry: Not on file    Inability: Not on file  .  Transportation needs:    Medical: Not on file    Non-medical: Not on file  Tobacco Use  . Smoking status: Never Smoker  . Smokeless tobacco: Never Used  Substance and Sexual Activity  . Alcohol use: Yes    Comment: Very seldom  . Drug use: No  . Sexual activity: Not Currently    Birth control/protection: Post-menopausal  Lifestyle  . Physical activity:    Days per week: Not on file    Minutes per session: Not on file  . Stress: Not on file  Relationships  . Social connections:    Talks on phone: Not on file    Gets together: Not on file    Attends religious service: Not on file    Active member of club or organization: Not on file    Attends meetings of clubs or organizations: Not on file    Relationship status: Not on file  . Intimate partner violence:    Fear of current or ex partner: Not on file    Emotionally abused: Not on file    Physically abused: Not on file    Forced sexual activity: Not on file  Other Topics Concern  . Not on file  Social History Narrative   Children live in [place]. She has 1 son and 2 daughters; brother and nephew live in [place] (brother stays with his son in [place], has a place in [place]). Widowed '94    Family History  Problem Relation Age of Onset  . Hypertension Mother   . Stroke Father   . Diabetes Father   . Hyperlipidemia Brother   . Atrial fibrillation Brother   . Hyperlipidemia Son   . Breast cancer Paternal Aunt        dx 26s; deceased early 3s  . Breast cancer Cousin        paternal first cousin  . Colon cancer Cousin        paternal first cousin; age dx 61s    Outpatient Encounter Medications as of 01/09/2018  Medication Sig Note  . atorvastatin (LIPITOR) 40 MG tablet Take 1 tablet (40 mg total) by mouth every evening.   . Calcium Carbonate (CALTRATE 600 PO) Take 1 tablet by mouth daily.   . Cholecalciferol (VITAMIN D3) 2000 UNITS TABS Take 2,000 Units by mouth daily.   Marland Kitchen denosumab (PROLIA) 60 MG/ML SOLN injection  Inject 60 mg into the skin See admin instructions. Administer in upper arm, thigh, or abdomen 12/13/2016: Every 6 months  . digoxin (DIGOX) 0.125 MG tablet Take  1 tablet (125 mcg total) by mouth daily.   Marland Kitchen loratadine (CLARITIN) 10 MG tablet Take 10 mg by mouth daily as needed for allergies.    . Multiple Vitamins-Minerals (CENTRUM ADULTS PO) Take 1 tablet by mouth daily.   . Multiple Vitamins-Minerals (ICAPS AREDS 2) CAPS Take 1 capsule by mouth 2 (two) times daily.   . polyethylene glycol (MIRALAX / GLYCOLAX) packet Take 17 g by mouth daily as needed for moderate constipation.  12/13/2016: Uses it about every other day (forgets to take it daily)  . verapamil (CALAN-SR) 240 MG CR tablet TAKE 1 TABLET(240 MG) BY MOUTH DAILY   . [DISCONTINUED] DIGOX 125 MCG tablet TAKE 1 TABLET BY MOUTH DAILY   . acetaminophen (TYLENOL) 500 MG tablet Take 1,000 mg by mouth every 6 (six) hours as needed.   . hydrocortisone (PROCTOZONE-HC) 2.5 % rectal cream APPLY EXTERNALLY TO THE AFFECTED AREA TWICE DAILY (Patient not taking: Reported on 07/30/2017)   . VALIUM 2 MG tablet TAKE 1 TABLET BY MOUTH EVERY 8 HOURS FOR MUSCLE SPASMS (Patient not taking: Reported on 01/09/2018) 07/30/2017: Rarely uses  . [DISCONTINUED] predniSONE (DELTASONE) 10 MG tablet Take 10 mg by mouth daily with breakfast.  07/30/2017: 21 tablet taper   No facility-administered encounter medications on file as of 01/09/2018.     Allergies  Allergen Reactions  . Azithromycin Nausea Only  . Arimidex [Anastrozole] Rash  . Avelox [Moxifloxacin Hcl In Nacl] Nausea Only and Rash  . Bactrim Nausea Only and Rash  . Biaxin [Clarithromycin] Nausea Only and Rash  . Macrobid [Nitrofurantoin] Nausea Only and Rash  . Penicillins Nausea Only and Rash    Has patient had a PCN reaction causing immediate rash, facial/tongue/throat swelling, SOB or lightheadedness with hypotension: no Has patient had a PCN reaction causing severe rash involving mucus membranes or skin  necrosis: no Has patient had a PCN reaction that required hospitalization no Has patient had a PCN reaction occurring within the last 10 years no If all of the above answers are "NO", then may proceed with Cephalosporin use.   . Sulfa Antibiotics Nausea And Vomiting and Rash    ROS: The patient denies fever, headaches, vision or hearing changes (had wax removed in June by Dr. Constance Holster), URI or allergy symptoms. Denies chest pain, palpitations, dizziness, syncope, dyspnea on exertion. Denies edema. Denies GI or GU complaints.  No numbness, tingling, weakness, tremor, suspicious skin lesions, depression, anxiety, abnormal bleeding/bruising, or enlarged lymph nodes. Very intermittent dysphagia--feels like a lump if she drinks very cold water. L knee pain--better since last cortisone shot last month. No further back pain Constipation --controlled by Miralax.Hemorrhoids are large, but not bothersome, just slight pink on pads, no true bleeding Alopecia is stable. See HPI.    PHYSICAL EXAM:  BP 122/74   Pulse 92   Ht 5\' 2"  (1.575 m)   Wt 124 lb (56.2 kg)   BMI 22.68 kg/m   Wt Readings from Last 3 Encounters:  01/09/18 124 lb (56.2 kg)  10/10/17 124 lb 4.8 oz (56.4 kg)  08/15/17 123 lb (55.8 kg)    She declines breast exam (Dr. [name] did it in July); declines pelvic exam. Declines changing into gown today.  General Appearance:   Alert, cooperative, no distress, appears somewhat younger than stated age  Head:   Normocephalic, without obvious abnormality, atraumatic  Eyes:   PERRL, conjunctiva/corneas clear, EOM's intact, fundi benign  Ears:   Normal external ear canals and TM's.  Nose:  Nares normal,  mucosa normal, no drainage or sinus tenderness  Throat:  Lips, mucosa, and tongue normal; teeth and gums normal. +torus pallatini  Neck:  Supple, no lymphadenopathy; thyroid: noenlargement/ tenderness/nodules; no carotid bruit or JVD  Back:  Spine nontender,  no curvature, ROM normal, no CVA tenderness  Lungs:   Clear to auscultation bilaterally without wheezes, rales or ronchi; respirations unlabored  Chest Wall:   No tenderness or deformity  Heart:   Regular rate and rhythm, S1 and S2 normal, no murmur, rubor gallop  Breast Exam:   Exam declined by patient (deferred to oncologist)  Abdomen:   Soft, non-tender, nondistended, normoactive bowel sounds, no masses, no hepatosplenomegaly. suprapubic catheter in place, tubing taped over lower abdomen.  Genitalia:  Declined by patient  Rectal:  Declined by patient  Extremities:  No clubbing, cyanosis or edema.   Pulses:  2+ and symmetric all extremities  Skin:  Skin color, texture, turgor normal, no rashes or lesions  Lymph nodes:  Cervical, supraclavicular, and axillary nodes normal  Neurologic:  CNII-XII intact, normal strength, sensation and gait; reflexes 2+ and symmetric throughout   Psych: Normal mood, affect, hygiene and grooming  Labs done in July by oncologist: Vitamin D-OH 47 Lab Results  Component Value Date   WBC 6.3 10/10/2017   HGB 12.6 10/10/2017   HCT 38.0 10/10/2017   MCV 87.3 10/10/2017   PLT 242 10/10/2017     Chemistry      Component Value Date/Time   NA 139 10/10/2017 1228   NA 141 10/03/2016 1107   K 3.9 10/10/2017 1228   K 4.5 10/03/2016 1107   CL 100 10/10/2017 1228   CL 102 03/01/2012 0912   CO2 30 10/10/2017 1228   CO2 32 (H) 10/03/2016 1107   BUN 12 10/10/2017 1228   BUN 11.7 10/03/2016 1107   CREATININE 0.78 10/10/2017 1228   CREATININE 0.8 10/03/2016 1107      Component Value Date/Time   CALCIUM 9.8 10/10/2017 1228   CALCIUM 10.6 (H) 10/03/2016 1107   ALKPHOS 80 10/10/2017 1228   ALKPHOS 81 10/03/2016 1107   AST 13 (L) 10/10/2017 1228   AST 17 10/03/2016 1107   ALT 14 10/10/2017 1228   ALT 16 10/03/2016 1107   BILITOT 0.4 10/10/2017 1228   BILITOT 0.63 10/03/2016 1107     Glu 102  (nonfasting)    ASSESSMENT/PLAN:  Medicare annual wellness visit, subsequent  Essential hypertension, benign - well controlled  Mixed hyperlipidemia - at goal per last check, continue atorvastatin  Atony of bladder - no complications regarding suprapubic catheter  Osteoporosis without current pathological fracture, unspecified osteoporosis type - cont Prolia injections q6 mos, Ca, D.  Encouraged her to get weight-bearing exercise, shown some she can do at home.  Palpitations - well controlled with CCB and digoxin, x years. Cont current regimen - Plan: digoxin (DIGOX) 0.125 MG tablet   Discussed monthly self breast exams and yearly mammograms; at least 30 minutes of aerobic activity at least 5 days/week, weight-bearing exercise 2x/week; proper sunscreen use reviewed; healthy diet, including goals of calcium and vitamin D intake and alcohol recommendations (less than or equal to 1 drink/day) reviewed; regular seatbelt use; changing batteries in smoke detectors. Immunization recommendations discussed: continue yearly high dose flu shots. Shingrix recommended, discussed risks/side effects, to get from pharmacy. Colonoscopy recommendations reviewed, UTD.  MOST form signed, Full Code, Full Care Patient has Living Will and healthcare POA;  F/u 6 mos, will be fasting for visit. Fasting glu, lipids, digoxin  level, poss TSH    Medicare Attestation I have personally reviewed: The patient's medical and social history Their use of alcohol, tobacco or illicit drugs Their current medications and supplements The patient's functional ability including ADLs,fall risks, home safety risks, cognitive, and hearing and visual impairment Diet and physical activities Evidence for depression or mood disorders  The patient's weight, height and BMI have been recorded in the chart.  I have made referrals, counseling, and provided education to the patient based on review of the above and I have provided  the patient with a written personalized care plan for preventive services.

## 2018-01-09 ENCOUNTER — Encounter: Payer: Self-pay | Admitting: Family Medicine

## 2018-01-09 ENCOUNTER — Ambulatory Visit (INDEPENDENT_AMBULATORY_CARE_PROVIDER_SITE_OTHER): Payer: Medicare Other | Admitting: Family Medicine

## 2018-01-09 VITALS — BP 122/74 | HR 92 | Ht 62.0 in | Wt 124.0 lb

## 2018-01-09 DIAGNOSIS — I10 Essential (primary) hypertension: Secondary | ICD-10-CM | POA: Diagnosis not present

## 2018-01-09 DIAGNOSIS — R002 Palpitations: Secondary | ICD-10-CM | POA: Diagnosis not present

## 2018-01-09 DIAGNOSIS — N312 Flaccid neuropathic bladder, not elsewhere classified: Secondary | ICD-10-CM

## 2018-01-09 DIAGNOSIS — M810 Age-related osteoporosis without current pathological fracture: Secondary | ICD-10-CM | POA: Diagnosis not present

## 2018-01-09 DIAGNOSIS — E782 Mixed hyperlipidemia: Secondary | ICD-10-CM

## 2018-01-09 DIAGNOSIS — Z0000 Encounter for general adult medical examination without abnormal findings: Secondary | ICD-10-CM

## 2018-01-09 MED ORDER — DIGOXIN 125 MCG PO TABS: 125.0000 ug | ORAL_TABLET | Freq: Every day | ORAL | 1 refills | Status: DC

## 2018-02-04 ENCOUNTER — Other Ambulatory Visit: Payer: Self-pay

## 2018-02-04 DIAGNOSIS — N312 Flaccid neuropathic bladder, not elsewhere classified: Secondary | ICD-10-CM | POA: Diagnosis not present

## 2018-02-04 DIAGNOSIS — R338 Other retention of urine: Secondary | ICD-10-CM | POA: Diagnosis not present

## 2018-02-22 ENCOUNTER — Other Ambulatory Visit: Payer: Self-pay | Admitting: Family Medicine

## 2018-02-22 DIAGNOSIS — E782 Mixed hyperlipidemia: Secondary | ICD-10-CM

## 2018-02-26 DIAGNOSIS — Z1231 Encounter for screening mammogram for malignant neoplasm of breast: Secondary | ICD-10-CM | POA: Diagnosis not present

## 2018-02-26 LAB — HM MAMMOGRAPHY

## 2018-02-27 DIAGNOSIS — N312 Flaccid neuropathic bladder, not elsewhere classified: Secondary | ICD-10-CM | POA: Diagnosis not present

## 2018-02-27 DIAGNOSIS — R1032 Left lower quadrant pain: Secondary | ICD-10-CM | POA: Diagnosis not present

## 2018-03-04 ENCOUNTER — Other Ambulatory Visit: Payer: Self-pay | Admitting: Family Medicine

## 2018-03-04 DIAGNOSIS — R002 Palpitations: Secondary | ICD-10-CM

## 2018-03-04 DIAGNOSIS — N312 Flaccid neuropathic bladder, not elsewhere classified: Secondary | ICD-10-CM | POA: Diagnosis not present

## 2018-03-06 ENCOUNTER — Encounter: Payer: Self-pay | Admitting: *Deleted

## 2018-03-09 DIAGNOSIS — J0100 Acute maxillary sinusitis, unspecified: Secondary | ICD-10-CM | POA: Diagnosis not present

## 2018-04-01 DIAGNOSIS — N312 Flaccid neuropathic bladder, not elsewhere classified: Secondary | ICD-10-CM | POA: Diagnosis not present

## 2018-04-10 ENCOUNTER — Encounter: Payer: Self-pay | Admitting: Oncology

## 2018-04-12 ENCOUNTER — Inpatient Hospital Stay: Payer: Medicare Other

## 2018-04-12 ENCOUNTER — Inpatient Hospital Stay: Payer: Medicare Other | Attending: Oncology

## 2018-04-12 VITALS — BP 162/89 | HR 90 | Temp 97.9°F | Resp 18

## 2018-04-12 DIAGNOSIS — Z853 Personal history of malignant neoplasm of breast: Secondary | ICD-10-CM | POA: Insufficient documentation

## 2018-04-12 DIAGNOSIS — Z170 Estrogen receptor positive status [ER+]: Secondary | ICD-10-CM

## 2018-04-12 DIAGNOSIS — C50411 Malignant neoplasm of upper-outer quadrant of right female breast: Secondary | ICD-10-CM

## 2018-04-12 DIAGNOSIS — M810 Age-related osteoporosis without current pathological fracture: Secondary | ICD-10-CM | POA: Diagnosis not present

## 2018-04-12 DIAGNOSIS — M818 Other osteoporosis without current pathological fracture: Secondary | ICD-10-CM

## 2018-04-12 LAB — COMPREHENSIVE METABOLIC PANEL
ALT: 14 U/L (ref 0–44)
AST: 15 U/L (ref 15–41)
Albumin: 3.9 g/dL (ref 3.5–5.0)
Alkaline Phosphatase: 76 U/L (ref 38–126)
Anion gap: 10 (ref 5–15)
BUN: 14 mg/dL (ref 8–23)
CO2: 32 mmol/L (ref 22–32)
CREATININE: 0.78 mg/dL (ref 0.44–1.00)
Calcium: 10 mg/dL (ref 8.9–10.3)
Chloride: 100 mmol/L (ref 98–111)
GFR calc Af Amer: >60 mL/min (ref >60)
GFR calc non Af Amer: >60 mL/min (ref >60)
GLUCOSE: 83 mg/dL (ref 70–99)
Potassium: 4 mmol/L (ref 3.5–5.1)
SODIUM: 142 mmol/L (ref 135–145)
Total Bilirubin: 0.5 mg/dL (ref 0.3–1.2)
Total Protein: 7.4 g/dL (ref 6.5–8.1)

## 2018-04-12 LAB — CBC WITH DIFFERENTIAL/PLATELET
Abs Immature Granulocytes: 0.02 10*3/uL (ref 0.00–0.07)
BASOS ABS: 0.1 10*3/uL (ref 0.0–0.1)
Basophils Relative: 1 %
EOS ABS: 0.2 10*3/uL (ref 0.0–0.5)
EOS PCT: 3 %
HEMATOCRIT: 40.6 % (ref 36.0–46.0)
Hemoglobin: 12.6 g/dL (ref 12.0–15.0)
Immature Granulocytes: 0 %
Lymphocytes Relative: 22 %
Lymphs Abs: 1.6 10*3/uL (ref 0.7–4.0)
MCH: 27.7 pg (ref 26.0–34.0)
MCHC: 31 g/dL (ref 30.0–36.0)
MCV: 89.2 fL (ref 80.0–100.0)
Monocytes Absolute: 0.7 10*3/uL (ref 0.1–1.0)
Monocytes Relative: 9 %
Neutro Abs: 4.8 10*3/uL (ref 1.7–7.7)
Neutrophils Relative %: 65 %
Platelets: 228 10*3/uL (ref 150–400)
RBC: 4.55 MIL/uL (ref 3.87–5.11)
RDW: 14.6 % (ref 11.5–15.5)
WBC: 7.5 10*3/uL (ref 4.0–10.5)
nRBC: 0 % (ref 0.0–0.2)

## 2018-04-12 MED ORDER — DENOSUMAB 60 MG/ML ~~LOC~~ SOSY
60.0000 mg | PREFILLED_SYRINGE | Freq: Once | SUBCUTANEOUS | Status: AC
  Administered 2018-04-12: 60 mg via SUBCUTANEOUS

## 2018-04-12 NOTE — Patient Instructions (Signed)

## 2018-04-26 DIAGNOSIS — M25562 Pain in left knee: Secondary | ICD-10-CM | POA: Diagnosis not present

## 2018-04-26 DIAGNOSIS — M1712 Unilateral primary osteoarthritis, left knee: Secondary | ICD-10-CM | POA: Diagnosis not present

## 2018-04-30 DIAGNOSIS — N3020 Other chronic cystitis without hematuria: Secondary | ICD-10-CM | POA: Diagnosis not present

## 2018-04-30 DIAGNOSIS — N312 Flaccid neuropathic bladder, not elsewhere classified: Secondary | ICD-10-CM | POA: Diagnosis not present

## 2018-05-27 ENCOUNTER — Other Ambulatory Visit: Payer: Self-pay | Admitting: Family Medicine

## 2018-05-27 DIAGNOSIS — E782 Mixed hyperlipidemia: Secondary | ICD-10-CM

## 2018-05-27 DIAGNOSIS — N312 Flaccid neuropathic bladder, not elsewhere classified: Secondary | ICD-10-CM | POA: Diagnosis not present

## 2018-06-09 ENCOUNTER — Other Ambulatory Visit: Payer: Self-pay | Admitting: Family Medicine

## 2018-06-09 DIAGNOSIS — R002 Palpitations: Secondary | ICD-10-CM

## 2018-06-24 ENCOUNTER — Other Ambulatory Visit: Payer: Self-pay | Admitting: Family Medicine

## 2018-06-24 DIAGNOSIS — R002 Palpitations: Secondary | ICD-10-CM

## 2018-06-24 DIAGNOSIS — N312 Flaccid neuropathic bladder, not elsewhere classified: Secondary | ICD-10-CM | POA: Diagnosis not present

## 2018-06-26 ENCOUNTER — Telehealth: Payer: Self-pay | Admitting: Family Medicine

## 2018-06-26 MED ORDER — VALIUM 2 MG PO TABS: ORAL_TABLET | ORAL | 0 refills | Status: DC

## 2018-06-26 NOTE — Telephone Encounter (Signed)
Spoke with patient (to wish her a Happy Passover and see how she is doing, making sure she didn't need anything). Advised her the rx was sent in.

## 2018-06-26 NOTE — Telephone Encounter (Signed)
Pt called for refill of Valium. She states she has not had this filled since 2018 and is down to about 4 pills. Please send to [facility] on [address]. Pt can be reached at [phone].

## 2018-07-03 ENCOUNTER — Telehealth: Payer: Self-pay | Admitting: Family Medicine

## 2018-07-03 DIAGNOSIS — K644 Residual hemorrhoidal skin tags: Secondary | ICD-10-CM

## 2018-07-03 MED ORDER — HYDROCORTISONE 2.5 % RE CREA: TOPICAL_CREAM | RECTAL | 1 refills | Status: DC

## 2018-07-03 NOTE — Telephone Encounter (Signed)
Pt called for refills of Proctozone. Please send to [facility] on [address]. Pt can be reached at [phone].

## 2018-07-03 NOTE — Telephone Encounter (Signed)
This was sent to me.

## 2018-07-16 ENCOUNTER — Other Ambulatory Visit: Payer: Self-pay | Admitting: Family Medicine

## 2018-07-16 DIAGNOSIS — Z5181 Encounter for therapeutic drug level monitoring: Secondary | ICD-10-CM

## 2018-07-16 DIAGNOSIS — E782 Mixed hyperlipidemia: Secondary | ICD-10-CM

## 2018-07-16 DIAGNOSIS — Z79899 Other long term (current) drug therapy: Secondary | ICD-10-CM

## 2018-07-16 DIAGNOSIS — R002 Palpitations: Secondary | ICD-10-CM

## 2018-07-22 ENCOUNTER — Encounter: Payer: Medicare Other | Admitting: Family Medicine

## 2018-07-22 DIAGNOSIS — N312 Flaccid neuropathic bladder, not elsewhere classified: Secondary | ICD-10-CM | POA: Diagnosis not present

## 2018-08-06 DIAGNOSIS — M1712 Unilateral primary osteoarthritis, left knee: Secondary | ICD-10-CM | POA: Diagnosis not present

## 2018-08-07 ENCOUNTER — Telehealth: Payer: Self-pay | Admitting: *Deleted

## 2018-08-07 NOTE — Telephone Encounter (Signed)
Patient called and she has an appt June 3rd that she is willing to come in for. She was calling to get your thoughts on something though. She has been totally isolated/quarantined this entire time and alone. Her family in [place] would like to get a private plane and fly her out there to stay with them through the fall. She does have an oncology appt in July that she would have to figure out as well though. What are your thoughts as far as her going?

## 2018-08-08 NOTE — Telephone Encounter (Signed)
Spoke with pt.  Her children in [place] have been quarantining.  There is a 83 year old.  Discussed depending on their actions, and how much exposure they have, increases her risk. If they are remaining safe and cautious despite things opening, and if she feels comfortable, that is her choice (being extra safe on private plane).  My opinion was to wait longer. She also had questions about going to grocery store at [place]. I think with the rain she just feels lonely/isolated/bored, unable to get to the park to walk.  She has been facetiming with family, and encouraged her to continue to do so.  She would be able to get her catheter changed at urologist in [place].  Recommended she be back to get her Prolia and visit with Dr. [name]. (vs delaying visit to family to a later date).  Discussed safety measures, and encouraged her to limit exposures.

## 2018-08-14 ENCOUNTER — Other Ambulatory Visit: Payer: Self-pay | Admitting: Family Medicine

## 2018-08-14 DIAGNOSIS — E782 Mixed hyperlipidemia: Secondary | ICD-10-CM

## 2018-08-19 DIAGNOSIS — N312 Flaccid neuropathic bladder, not elsewhere classified: Secondary | ICD-10-CM | POA: Diagnosis not present

## 2018-08-20 NOTE — Progress Notes (Signed)
Chief Complaint  Patient presents with  . Hypertension    fasting med check, no new concerns. Blood drawn already.     Hypertension and palpitation follow-up: Blood pressures are not checked elsewhere. Denies dizziness, headaches, chest pain. Denies side effects of medications.She continues on Verapamil and Digoxin.  Digoxin levels are monitored yearly, have been normal, due for check today. Palpitations are well controlled on her current med regimen, and is tolerating generic digoxin without problems. She is hesitant to make any changes to her regimen that has been working so well for many years. BP's have fluctuated widely at prior visits.  Yesterday she had a headache and some nausea.  She was tired, went to bed early.  Drinking a Coke helped.  She feels fine today. That was unusual, only rarely gets headaches.  BP Readings from Last 3 Encounters:  04/12/18 (!) 162/89  01/09/18 122/74  10/10/17 (!) 172/83   Hyperlipidemia follow-up: Patient is reportedly following a low cholesterol diet. Compliant with medications (atorvastatin 40mg ) and denies medication side effects.She is no longer taking fish oil, as she had a hard time swallowing it; continues onAREDs. She continues to have 2York peppermint patties each day (she finds it helps settle her stomach). She is due for repeat lipids today, okay on last check a year ago. Lab Results  Component Value Date   CHOL 229 (H) 08/07/2017   HDL 76 08/07/2017   LDLCALC 125 (H) 08/07/2017   TRIG 141 08/07/2017   CHOLHDL 3.0 08/07/2017   Urinary retention--she has suprapubic catheter. Shedrains itabout every 2-3 hours.Shegoes to theurologist's officeonce a month to have the catheter changed.She sees Dr. Junious Silk now that Dr. [name] retired.Denies pain, abnormal urine. Denies cloudy or bloody urine, no odor.  She is planning a trip to [facility] to stay with family for a while in the Fall, and has found a urologist to see to have the  catheter changed while staying there.  She was started on Prolia injections by Dr. [name] in July 2014. Last injection was in January, due again in July. She is taking calcium and vitamin D. Not getting any weight-bearing exercise. DEXA last done12/2018. T-3.0 at L wrist; stable at L wrist and R fem neck, improved at L fem neck. She cut back on her caltrate from 2x/d to 1/ddue to constipation. She continues to eata lot of dairy products (cottage, cheese, yogurt.)  H/o breast cancer--now over 10 years from diagnosis/treatment. She still sees Dr. [name] yearly, denies any breast concerns or complaints.  No pain with walking at the park.  Sometimes gets some left hip pain, sporadically.  Uses occasional Advil (not regularly, about once a week). Tylenol doesn't usually help.   Alopecia: previously under the care of Dr. Delman Cheadle, hasn't seen her in a while. Still has the topical medications, but has been forgetting to use it. Hasn't had injections in a long time (maybe a year).  Anxiety: infrequent, uses Valium very sparingly, most often needs related to travel.  Last filled in April 2020 for #30.    PMH, PSH, SH reviewed  Outpatient Encounter Medications as of 08/21/2018  Medication Sig Note  . atorvastatin (LIPITOR) 40 MG tablet TAKE 1 TABLET(40 MG) BY MOUTH EVERY EVENING   . Calcium Carbonate (CALTRATE 600 PO) Take 1 tablet by mouth daily.   . Cholecalciferol (VITAMIN D3) 2000 UNITS TABS Take 2,000 Units by mouth daily.   Marland Kitchen denosumab (PROLIA) 60 MG/ML SOLN injection Inject 60 mg into the skin See admin instructions. Administer  in upper arm, thigh, or abdomen 12/13/2016: Every 6 months  . digoxin (LANOXIN) 0.125 MG tablet TAKE 1 TABLET(125 MCG) BY MOUTH DAILY   . loratadine (CLARITIN) 10 MG tablet Take 10 mg by mouth daily as needed for allergies.    . Multiple Vitamins-Minerals (CENTRUM ADULTS PO) Take 1 tablet by mouth daily.   . Multiple Vitamins-Minerals (ICAPS AREDS 2) CAPS Take 1  capsule by mouth 2 (two) times daily.   . polyethylene glycol (MIRALAX / GLYCOLAX) packet Take 17 g by mouth daily as needed for moderate constipation.  12/13/2016: Uses it about every other day (forgets to take it daily)  . verapamil (CALAN-SR) 240 MG CR tablet TAKE 1 TABLET(240 MG) BY MOUTH DAILY   . hydrocortisone (ANUSOL-HC) 2.5 % rectal cream APPLY EXTERNALLY TO THE AFFECTED AREA TWICE DAILY (Patient not taking: Reported on 08/21/2018)   . VALIUM 2 MG tablet TAKE 1 TABLET BY MOUTH EVERY 8 HOURS FOR MUSCLE SPASMS (Patient not taking: Reported on 08/21/2018)   . [DISCONTINUED] acetaminophen (TYLENOL) 500 MG tablet Take 1,000 mg by mouth every 6 (six) hours as needed.    No facility-administered encounter medications on file as of 08/21/2018.    Allergies  Allergen Reactions  . Azithromycin Nausea Only  . Arimidex [Anastrozole] Rash  . Avelox [Moxifloxacin Hcl In Nacl] Nausea Only and Rash  . Bactrim Nausea Only and Rash  . Biaxin [Clarithromycin] Nausea Only and Rash  . Macrobid [Nitrofurantoin] Nausea Only and Rash  . Penicillins Nausea Only and Rash    Has patient had a PCN reaction causing immediate rash, facial/tongue/throat swelling, SOB or lightheadedness with hypotension: no Has patient had a PCN reaction causing severe rash involving mucus membranes or skin necrosis: no Has patient had a PCN reaction that required hospitalization no Has patient had a PCN reaction occurring within the last 10 years no If all of the above answers are "NO", then may proceed with Cephalosporin use.   . Sulfa Antibiotics Nausea And Vomiting and Rash   ROS: no fever, chills, URI symptoms, chest pain, palpitations, shortness of breath.  Headache yesterday, but otherwise only rarely.  Denies dizziness or syncope.  She has been fairly isolated during the pandemic, with her nephew's wife helping her (and being critical, ensuring that she stays home).  She has noticed that she has less of an appetite.  She used to  enjoy eating out, hasn't been, and has noted some weight loss.  Denies depression.  She is looking forward to visiting family in the Fall.  Would like to try and get out of the house more (but [name] disapproves).    PHYSICAL EXAM:  BP 130/80   Pulse 80   Temp (!) 96.9 F (36.1 C) (Temporal)   Ht 5' 2.5" (1.588 m)   Wt 116 lb 6.4 oz (52.8 kg)   BMI 20.95 kg/m   Wt Readings from Last 3 Encounters:  08/21/18 116 lb 6.4 oz (52.8 kg)  01/09/18 124 lb (56.2 kg)  10/10/17 124 lb 4.8 oz (56.4 kg)   Well developed, pleasant elderly female, in good spirits, in no distress She is alert, oriented.  She has normal mood, affect, hygiene and grooming, normal eye contact and speech. Some thinning of the hair, but no focal areas of alopecia noted. HEENT: conjunctiva and sclera are clear, EOMI.  Nose and mouth not examined due to wearing mask. Neck: no lymphadenopathy, thyromegaly or carotid bruit Heart: regular rate and rhythm, no murmur, rub, gallop or ectopy Lungs:  clear bilaterally Back: no spinal or CVA tenderness Abdomen: soft, nontender, no masses.  Catheter taped in place. Extremities: no edema, 2+ pulses   ASSESSMENT/PLAN:  Mixed hyperlipidemia - Due for recheck. Cont atorvastatin. Reviewed lowfat, low cholesterol diet. - Plan: Lipid panel, atorvastatin (LIPITOR) 40 MG tablet  Medication monitoring encounter - Plan: Digoxin level, Lipid panel  Essential hypertension, benign - well controlled  Atony of bladder - cont monthly catheter changes.  No s/sx of infection  Osteoporosis without current pathological fracture, unspecified osteoporosis type - Cont Prolia q 6 months. Cont Ca, D. Weight-bearing exercise recommended. DEXA due 02/2019  Palpitations - Plan: Digoxin level, verapamil (CALAN-SR) 240 MG CR tablet  Long-term use of high-risk medication - Plan: Digoxin level  Mixed hyperlipidemia - Plan: Lipid panel, atorvastatin (LIPITOR) 40 MG tablet  Palpitations - well controlled  on low dose digoxin and verapamil - Plan: Digoxin level, verapamil (CALAN-SR) 240 MG CR tablet  F/u  4mos for AWV/med check  Discussed weight loss, need for adequate nutrition to prevent further losses. Discussed lowfat, low cholesterol diet. Discussed COVID-19 and safety precautions she should be taking if/when she goes out. Agree with her going to see her family for extended period of time in the Fall (they had offered private plane), and to stay for the Jewish holidays (which may not be in person at temple this year).  She isn't planning to go sooner bc they will be in [place]. All questions answered (about going to various stores, etc, washing down groceries, etc.).

## 2018-08-21 ENCOUNTER — Encounter: Payer: Self-pay | Admitting: Family Medicine

## 2018-08-21 ENCOUNTER — Ambulatory Visit (INDEPENDENT_AMBULATORY_CARE_PROVIDER_SITE_OTHER): Payer: Medicare Other | Admitting: Family Medicine

## 2018-08-21 ENCOUNTER — Other Ambulatory Visit: Payer: Self-pay

## 2018-08-21 VITALS — BP 130/80 | HR 80 | Temp 96.9°F | Ht 62.5 in | Wt 116.4 lb

## 2018-08-21 DIAGNOSIS — N312 Flaccid neuropathic bladder, not elsewhere classified: Secondary | ICD-10-CM | POA: Diagnosis not present

## 2018-08-21 DIAGNOSIS — Z5181 Encounter for therapeutic drug level monitoring: Secondary | ICD-10-CM | POA: Diagnosis not present

## 2018-08-21 DIAGNOSIS — Z79899 Other long term (current) drug therapy: Secondary | ICD-10-CM

## 2018-08-21 DIAGNOSIS — M810 Age-related osteoporosis without current pathological fracture: Secondary | ICD-10-CM

## 2018-08-21 DIAGNOSIS — E782 Mixed hyperlipidemia: Secondary | ICD-10-CM

## 2018-08-21 DIAGNOSIS — I10 Essential (primary) hypertension: Secondary | ICD-10-CM | POA: Diagnosis not present

## 2018-08-21 DIAGNOSIS — R002 Palpitations: Secondary | ICD-10-CM

## 2018-08-21 MED ORDER — ATORVASTATIN CALCIUM 40 MG PO TABS: ORAL_TABLET | ORAL | 1 refills | Status: DC

## 2018-08-21 MED ORDER — VERAPAMIL HCL ER 240 MG PO TBCR: 240.0000 mg | EXTENDED_RELEASE_TABLET | Freq: Every day | ORAL | 1 refills | Status: DC

## 2018-08-21 NOTE — Patient Instructions (Signed)
Continue your same medications. You have lost some weight--be sure that you are eating enough. Continue to be safe--limiting outings, and being careful with wearing a mask and hand-washing/Purell when you are out.

## 2018-08-22 ENCOUNTER — Encounter: Payer: Self-pay | Admitting: Family Medicine

## 2018-08-22 LAB — DIGOXIN LEVEL: Digoxin, Serum: 0.7 ng/mL (ref 0.5–0.9)

## 2018-08-22 LAB — LIPID PANEL
Chol/HDL Ratio: 3 ratio (ref 0.0–4.4)
Cholesterol, Total: 198 mg/dL (ref 100–199)
HDL: 67 mg/dL (ref >39)
LDL Calculated: 90 mg/dL (ref 0–99)
Triglycerides: 205 mg/dL — ABNORMAL HIGH (ref 0–149)
VLDL Cholesterol Cal: 41 mg/dL — ABNORMAL HIGH (ref 5–40)

## 2018-09-01 ENCOUNTER — Other Ambulatory Visit: Payer: Self-pay | Admitting: Oncology

## 2018-09-16 DIAGNOSIS — N312 Flaccid neuropathic bladder, not elsewhere classified: Secondary | ICD-10-CM | POA: Diagnosis not present

## 2018-09-27 DIAGNOSIS — H04123 Dry eye syndrome of bilateral lacrimal glands: Secondary | ICD-10-CM | POA: Diagnosis not present

## 2018-09-27 DIAGNOSIS — H26493 Other secondary cataract, bilateral: Secondary | ICD-10-CM | POA: Diagnosis not present

## 2018-09-27 DIAGNOSIS — H524 Presbyopia: Secondary | ICD-10-CM | POA: Diagnosis not present

## 2018-09-27 DIAGNOSIS — H4423 Degenerative myopia, bilateral: Secondary | ICD-10-CM | POA: Diagnosis not present

## 2018-09-29 ENCOUNTER — Other Ambulatory Visit: Payer: Self-pay | Admitting: Family Medicine

## 2018-09-29 DIAGNOSIS — R002 Palpitations: Secondary | ICD-10-CM

## 2018-10-01 DIAGNOSIS — H3540 Unspecified peripheral retinal degeneration: Secondary | ICD-10-CM | POA: Diagnosis not present

## 2018-10-01 DIAGNOSIS — H15833 Staphyloma posticum, bilateral: Secondary | ICD-10-CM | POA: Diagnosis not present

## 2018-10-01 DIAGNOSIS — H442E3 Degenerative myopia with other maculopathy, bilateral eye: Secondary | ICD-10-CM | POA: Diagnosis not present

## 2018-10-01 DIAGNOSIS — H43813 Vitreous degeneration, bilateral: Secondary | ICD-10-CM | POA: Diagnosis not present

## 2018-10-02 ENCOUNTER — Telehealth: Payer: Self-pay | Admitting: Oncology

## 2018-10-02 NOTE — Telephone Encounter (Signed)
GM PAL 7/23 f/u moved to 7/31 as webex and associated appointments cancelled per GM. Confirmed with patient. Per patient she does not have technology to perform virtual visits and patient prefers in patient visit to phone call. Rescheduled f/u to 8/3 and restored lab/inj also. Confirmed 8/3 appointments with patient.

## 2018-10-10 ENCOUNTER — Ambulatory Visit: Payer: Medicare Other

## 2018-10-10 ENCOUNTER — Other Ambulatory Visit: Payer: Medicare Other

## 2018-10-10 ENCOUNTER — Ambulatory Visit: Payer: Self-pay

## 2018-10-10 ENCOUNTER — Ambulatory Visit: Payer: Medicare Other | Admitting: Oncology

## 2018-10-14 DIAGNOSIS — N312 Flaccid neuropathic bladder, not elsewhere classified: Secondary | ICD-10-CM | POA: Diagnosis not present

## 2018-10-18 ENCOUNTER — Ambulatory Visit: Payer: Medicare Other | Admitting: Oncology

## 2018-10-21 ENCOUNTER — Other Ambulatory Visit: Payer: Self-pay

## 2018-10-21 ENCOUNTER — Other Ambulatory Visit: Payer: Self-pay | Admitting: *Deleted

## 2018-10-21 ENCOUNTER — Inpatient Hospital Stay: Payer: Medicare Other

## 2018-10-21 ENCOUNTER — Telehealth: Payer: Self-pay | Admitting: Oncology

## 2018-10-21 ENCOUNTER — Inpatient Hospital Stay: Payer: Medicare Other | Attending: Oncology | Admitting: Oncology

## 2018-10-21 VITALS — BP 181/98 | HR 83 | Temp 97.9°F | Resp 20 | Ht 62.5 in | Wt 117.2 lb

## 2018-10-21 DIAGNOSIS — Z853 Personal history of malignant neoplasm of breast: Secondary | ICD-10-CM | POA: Diagnosis not present

## 2018-10-21 DIAGNOSIS — C50411 Malignant neoplasm of upper-outer quadrant of right female breast: Secondary | ICD-10-CM

## 2018-10-21 DIAGNOSIS — Z170 Estrogen receptor positive status [ER+]: Secondary | ICD-10-CM

## 2018-10-21 DIAGNOSIS — M810 Age-related osteoporosis without current pathological fracture: Secondary | ICD-10-CM | POA: Diagnosis not present

## 2018-10-21 DIAGNOSIS — M818 Other osteoporosis without current pathological fracture: Secondary | ICD-10-CM

## 2018-10-21 LAB — CBC WITH DIFFERENTIAL (CANCER CENTER ONLY)
Abs Immature Granulocytes: 0.01 10*3/uL (ref 0.00–0.07)
Basophils Absolute: 0.1 10*3/uL (ref 0.0–0.1)
Basophils Relative: 1 %
Eosinophils Absolute: 0.2 10*3/uL (ref 0.0–0.5)
Eosinophils Relative: 4 %
HCT: 41.8 % (ref 36.0–46.0)
Hemoglobin: 13.2 g/dL (ref 12.0–15.0)
Immature Granulocytes: 0 %
Lymphocytes Relative: 20 %
Lymphs Abs: 1.3 10*3/uL (ref 0.7–4.0)
MCH: 27.9 pg (ref 26.0–34.0)
MCHC: 31.6 g/dL (ref 30.0–36.0)
MCV: 88.4 fL (ref 80.0–100.0)
Monocytes Absolute: 0.7 10*3/uL (ref 0.1–1.0)
Monocytes Relative: 10 %
Neutro Abs: 4.3 10*3/uL (ref 1.7–7.7)
Neutrophils Relative %: 65 %
Platelet Count: 232 10*3/uL (ref 150–400)
RBC: 4.73 MIL/uL (ref 3.87–5.11)
RDW: 14.4 % (ref 11.5–15.5)
WBC Count: 6.6 10*3/uL (ref 4.0–10.5)
nRBC: 0 % (ref 0.0–0.2)

## 2018-10-21 LAB — CMP (CANCER CENTER ONLY)
ALT: 14 U/L (ref 0–44)
AST: 16 U/L (ref 15–41)
Albumin: 4 g/dL (ref 3.5–5.0)
Alkaline Phosphatase: 79 U/L (ref 38–126)
Anion gap: 9 (ref 5–15)
BUN: 15 mg/dL (ref 8–23)
CO2: 30 mmol/L (ref 22–32)
Calcium: 10.3 mg/dL (ref 8.9–10.3)
Chloride: 101 mmol/L (ref 98–111)
Creatinine: 0.81 mg/dL (ref 0.44–1.00)
GFR, Est AFR Am: >60 mL/min (ref >60)
GFR, Estimated: >60 mL/min (ref >60)
Glucose, Bld: 91 mg/dL (ref 70–99)
Potassium: 3.9 mmol/L (ref 3.5–5.1)
Sodium: 140 mmol/L (ref 135–145)
Total Bilirubin: 0.5 mg/dL (ref 0.3–1.2)
Total Protein: 7.5 g/dL (ref 6.5–8.1)

## 2018-10-21 MED ORDER — DENOSUMAB 60 MG/ML ~~LOC~~ SOSY
60.0000 mg | PREFILLED_SYRINGE | Freq: Once | SUBCUTANEOUS | Status: AC
  Administered 2018-10-21: 60 mg via SUBCUTANEOUS

## 2018-10-21 MED ORDER — DENOSUMAB 60 MG/ML ~~LOC~~ SOSY
PREFILLED_SYRINGE | SUBCUTANEOUS | Status: AC
  Filled 2018-10-21: qty 1

## 2018-10-21 NOTE — Telephone Encounter (Signed)
Confirmed with patient May 2021 appointments. Also mailed schedule. [facility] will contact patient re annual mammo/bone density.
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
## 2018-10-21 NOTE — Patient Instructions (Signed)
Denosumab injection What is this medicine? DENOSUMAB (den oh sue mab) slows bone breakdown. Prolia is used to treat osteoporosis in women after menopause and in men, and in people who are taking corticosteroids for 6 months or more. Xgeva is used to treat a high calcium level due to cancer and to prevent bone fractures and other bone problems caused by multiple myeloma or cancer bone metastases. Xgeva is also used to treat giant cell tumor of the bone. This medicine may be used for other purposes; ask your health care provider or pharmacist if you have questions. COMMON BRAND NAME(S): Prolia, XGEVA What should I tell my health care provider before I take this medicine? They need to know if you have any of these conditions:  dental disease  having surgery or tooth extraction  infection  kidney disease  low levels of calcium or Vitamin D in the blood  malnutrition  on hemodialysis  skin conditions or sensitivity  thyroid or parathyroid disease  an unusual reaction to denosumab, other medicines, foods, dyes, or preservatives  pregnant or trying to get pregnant  breast-feeding How should I use this medicine? This medicine is for injection under the skin. It is given by a health care professional in a hospital or clinic setting. A special MedGuide will be given to you before each treatment. Be sure to read this information carefully each time. For Prolia, talk to your pediatrician regarding the use of this medicine in children. Special care may be needed. For Xgeva, talk to your pediatrician regarding the use of this medicine in children. While this drug may be prescribed for children as young as 13 years for selected conditions, precautions do apply. Overdosage: If you think you have taken too much of this medicine contact a poison control center or emergency room at once. NOTE: This medicine is only for you. Do not share this medicine with others. What if I miss a dose? It is  important not to miss your dose. Call your doctor or health care professional if you are unable to keep an appointment. What may interact with this medicine? Do not take this medicine with any of the following medications:  other medicines containing denosumab This medicine may also interact with the following medications:  medicines that lower your chance of fighting infection  steroid medicines like prednisone or cortisone This list may not describe all possible interactions. Give your health care provider a list of all the medicines, herbs, non-prescription drugs, or dietary supplements you use. Also tell them if you smoke, drink alcohol, or use illegal drugs. Some items may interact with your medicine. What should I watch for while using this medicine? Visit your doctor or health care professional for regular checks on your progress. Your doctor or health care professional may order blood tests and other tests to see how you are doing. Call your doctor or health care professional for advice if you get a fever, chills or sore throat, or other symptoms of a cold or flu. Do not treat yourself. This drug may decrease your body's ability to fight infection. Try to avoid being around people who are sick. You should make sure you get enough calcium and vitamin D while you are taking this medicine, unless your doctor tells you not to. Discuss the foods you eat and the vitamins you take with your health care professional. See your dentist regularly. Brush and floss your teeth as directed. Before you have any dental work done, tell your dentist you are   receiving this medicine. Do not become pregnant while taking this medicine or for 5 months after stopping it. Talk with your doctor or health care professional about your birth control options while taking this medicine. Women should inform their doctor if they wish to become pregnant or think they might be pregnant. There is a potential for serious side  effects to an unborn child. Talk to your health care professional or pharmacist for more information. What side effects may I notice from receiving this medicine? Side effects that you should report to your doctor or health care professional as soon as possible:  allergic reactions like skin rash, itching or hives, swelling of the face, lips, or tongue  bone pain  breathing problems  dizziness  jaw pain, especially after dental work  redness, blistering, peeling of the skin  signs and symptoms of infection like fever or chills; cough; sore throat; pain or trouble passing urine  signs of low calcium like fast heartbeat, muscle cramps or muscle pain; pain, tingling, numbness in the hands or feet; seizures  unusual bleeding or bruising  unusually weak or tired Side effects that usually do not require medical attention (report to your doctor or health care professional if they continue or are bothersome):  constipation  diarrhea  headache  joint pain  loss of appetite  muscle pain  runny nose  tiredness  upset stomach This list may not describe all possible side effects. Call your doctor for medical advice about side effects. You may report side effects to FDA at 1-800-FDA-1088. Where should I keep my medicine? This medicine is only given in a clinic, doctor's office, or other health care setting and will not be stored at home. NOTE: This sheet is a summary. It may not cover all possible information. If you have questions about this medicine, talk to your doctor, pharmacist, or health care provider.  2020 Elsevier/Gold Standard (2017-07-13 16:10:44)

## 2018-10-22 DIAGNOSIS — H6123 Impacted cerumen, bilateral: Secondary | ICD-10-CM | POA: Diagnosis not present

## 2018-10-22 LAB — VITAMIN D 25 HYDROXY (VIT D DEFICIENCY, FRACTURES): Vit D, 25-Hydroxy: 56.5 ng/mL (ref 30.0–100.0)

## 2018-11-11 DIAGNOSIS — R3915 Urgency of urination: Secondary | ICD-10-CM | POA: Diagnosis not present

## 2018-11-27 ENCOUNTER — Other Ambulatory Visit (INDEPENDENT_AMBULATORY_CARE_PROVIDER_SITE_OTHER): Payer: Medicare Other

## 2018-11-27 ENCOUNTER — Telehealth: Payer: Self-pay | Admitting: *Deleted

## 2018-11-27 ENCOUNTER — Other Ambulatory Visit: Payer: Self-pay

## 2018-11-27 DIAGNOSIS — Z23 Encounter for immunization: Secondary | ICD-10-CM | POA: Diagnosis not present

## 2018-11-27 NOTE — Telephone Encounter (Signed)
Spoke with pt, discussed in detail

## 2018-11-27 NOTE — Telephone Encounter (Signed)
Patient was here for her flu shot (in car). Her daughter wants to fly here from [place] Monday to stay with her and she wants to know what you think. Her grand-daughters are going to school two days a week (in person) and her daughter will be flying here to [place]. She will then get a rental car to drive to [place] and will stay with Ms Pettit. She said she will sleep upstairs and will wear a mask at all times. Patient is somewhat apprehensive but wanted to get your opinion.

## 2018-12-04 DIAGNOSIS — H3540 Unspecified peripheral retinal degeneration: Secondary | ICD-10-CM | POA: Diagnosis not present

## 2018-12-04 DIAGNOSIS — H442E3 Degenerative myopia with other maculopathy, bilateral eye: Secondary | ICD-10-CM | POA: Diagnosis not present

## 2018-12-04 DIAGNOSIS — H43813 Vitreous degeneration, bilateral: Secondary | ICD-10-CM | POA: Diagnosis not present

## 2018-12-04 DIAGNOSIS — H15833 Staphyloma posticum, bilateral: Secondary | ICD-10-CM | POA: Diagnosis not present

## 2018-12-09 DIAGNOSIS — N312 Flaccid neuropathic bladder, not elsewhere classified: Secondary | ICD-10-CM | POA: Diagnosis not present

## 2019-01-07 DIAGNOSIS — R338 Other retention of urine: Secondary | ICD-10-CM | POA: Diagnosis not present

## 2019-02-03 DIAGNOSIS — N312 Flaccid neuropathic bladder, not elsewhere classified: Secondary | ICD-10-CM | POA: Diagnosis not present

## 2019-02-12 ENCOUNTER — Other Ambulatory Visit: Payer: Self-pay

## 2019-03-09 ENCOUNTER — Other Ambulatory Visit: Payer: Self-pay | Admitting: Family Medicine

## 2019-03-09 DIAGNOSIS — E782 Mixed hyperlipidemia: Secondary | ICD-10-CM

## 2019-03-16 ENCOUNTER — Other Ambulatory Visit: Payer: Self-pay | Admitting: Family Medicine

## 2019-03-16 DIAGNOSIS — R002 Palpitations: Secondary | ICD-10-CM

## 2019-03-23 ENCOUNTER — Other Ambulatory Visit: Payer: Self-pay | Admitting: Family Medicine

## 2019-03-23 DIAGNOSIS — R002 Palpitations: Secondary | ICD-10-CM

## 2019-04-01 DIAGNOSIS — N312 Flaccid neuropathic bladder, not elsewhere classified: Secondary | ICD-10-CM | POA: Diagnosis not present

## 2019-04-01 NOTE — Progress Notes (Signed)
Chief Complaint  Patient presents with  . Annual Exam    MWV  no other issues just not that hungry, last inj. on knee was 2 weeks ago.     Mary [name] is a 86 y.o. female who presents for annual wellness visit and follow-up on chronic medical conditions.  She has the following concerns:  Decreased appetite.  She doesn't like certain foods, and isn't eating out. She does take in some food, but admits she doesn't like to eat leftovers. She likes a little chicken, likes fish (but isn't cooking it as much, eats tuna salad).  She has been drinking carnation drinks instead of lunch if she has a late breakfast.  She admits to feeling somewhat lonely.  Her only family here in town is completely quarantined, no interaction with them. [place] with her children helps.  Too cold to eat outside (to go out) and to walk at park outside.Marland Kitchen "It gets a little discouraging"  Her daughters came (one for her birthday, one for Thanksgiving). She enjoys doing puzzles, facetiming with family.  She doesn't feel hopeless, no SI, no trouble sleeping.  Hypertension and palpitation follow-up: Blood pressures are not checked elsewhere. Denies dizziness, headaches, chest pain. Denies side effects of medications.She continues on Verapamil and Digoxin. Digoxin levels are monitored yearly, have been normal.Palpitations are well controlled on her current med regimen, and is tolerating generic digoxin without problems. She is hesitant to make any changes to her regimen that has been working so well for many years.  BP Readings from Last 3 Encounters:  10/21/18 (!) 181/98  08/21/18 130/80  04/12/18 (!) 162/89   Hyperlipidemia follow-up: Patient is reportedly following a low cholesterol diet. Compliant with medications (atorvastatin 64m) and denies medication side effects.She is no longer taking fish oil, as she had a hard time swallowing it; continues onAREDs. She continues to haveYork peppermint patties, daily after a meal  (she finds it helps settle her stomach).TG were elevated on last check, rest of lipids were good: Lab Results  Component Value Date   CHOL 198 08/21/2018   HDL 67 08/21/2018   LDLCALC 90 08/21/2018   TRIG 205 (H) 08/21/2018   CHOLHDL 3.0 08/21/2018   Urinary retention--she has suprapubic catheter. Shedrains itabout every2-3 hours.Shegoes to theurologist's officeonce a month to have the catheter changed.Denies pain, abnormal urine. Denies cloudy or bloody urine, no odor.   Osteoporosis: She was started on Prolia injections by Dr. MJana Hakimin July 2014. Last injection was in August 2020.Dr. MJana Hakimmentioned possibly delaying her next injection until May, so that she could be vaccinated and safer to come to office. She is taking calcium and vitamin D. Not getting any weight-bearing exercise. DEXA last done12/2018. T-3.0 at L wrist; stable at L wrist and R fem neck, improved at L fem neck.  She only takes caltrate once daily due to constipation (was worse when taking BID).  She continues to eata lot of dairy products (cottage cheese, yogurt.)  She has not been walking since it got cold.  She has noticed that she gets a little out of breath going up the incline near her house.  H/o breast cancer:  She is >12 years from diagnosis/treatment, still sees Dr. MJana Hakimyearly. She last saw him in August. She denies any breast concerns or complaints. She is due for mammogram, and has it scheduled.  Left knee pain--s/p injections in the left knee, under the care of Dr. AWynelle Link She has been told that she has bone on bone.  Injections have been helpful. Last one was 2 weeks ago. Also has some pain at the R groin, just in the last week.  Tried an Aleve, which didn't help much.  She generally uses advil for her knee pain (just a couple of times/week, and it helps).  Anxiety: infrequent, uses Valium very sparingly, most often needs related to travel.  Last filled in April 2020 for  #30.   Immunization History  Administered Date(s) Administered  . Fluad Quad(high Dose 65+) 11/27/2018  . Influenza Split 12/01/2010, 12/18/2012, 01/02/2015, 01/02/2015  . Influenza, High Dose Seasonal PF 12/13/2016, 12/24/2017  . Influenza-Unspecified 12/08/2013  . Pneumococcal Conjugate-13 04/15/2014  . Pneumococcal Polysaccharide-23 12/19/2002, 09/04/2011  . Td 11/19/2003  . Tdap 09/04/2011   Last Pap smear: 2013 Last mammogram: 02/2018, has appt scheduled Last colonoscopy: 2013, told no further was needed Last DEXA: 02/2017 T-3.0 at L wrist; showed improvement at L fem neck.  L wrist and R fem neck were stable  Dentist: twice yearly Ophtho: yearly Exercise: minimal. No weight-bearing exercise  Recent labs reviewed--normal CBC, c-met and Vitamin D-OH level donein 10/2018 (ordered by onc)  Other doctors caring for patient include: Dr. Diamantina Monks Dr. Junious Silk Dr. Olga Coaster (just prn, for cerumen removal) Dr. Lutricia Horsfall Dr. Wynelle Link, ([place]-for acute visit only)--ortho Dr. Magrinat--oncologist Dr. Belinda Block Dr. Dorothe Pea  Depressionscreen:  PHQ-2 score of 1 (feeling a little down several days) Fall screen: negative Functional Status survey: some trouble hearing in crowds, slight memory. Trouble with stairs due to knee pain Mini-Cog screen: 4/5 (missed 1 word recall)  See Epic for full questionnaires/screens  End of Life Discussion: Patient hasa living will and medical power of attorney  PMH, [place], [place] reviewed and updated  Outpatient Encounter Medications as of 04/02/2019  Medication Sig Note  . atorvastatin (LIPITOR) 40 MG tablet TAKE 1 TABLET(40 MG) BY MOUTH EVERY EVENING   . Calcium Carbonate (CALTRATE 600 PO) Take 1 tablet by mouth daily.   . Cholecalciferol (VITAMIN D3) 2000 UNITS TABS Take 2,000 Units by mouth daily.   Marland Kitchen denosumab (PROLIA) 60 MG/ML SOLN injection Inject 60 mg into the skin See admin instructions. Administer in upper arm,  thigh, or abdomen 12/13/2016: Every 6 months  . digoxin (LANOXIN) 0.125 MG tablet TAKE 1 TABLET(125 MCG) BY MOUTH DAILY   . hydrocortisone (ANUSOL-HC) 2.5 % rectal cream APPLY EXTERNALLY TO THE AFFECTED AREA TWICE DAILY   . loratadine (CLARITIN) 10 MG tablet Take 10 mg by mouth daily as needed for allergies.    . Multiple Vitamins-Minerals (CENTRUM ADULTS PO) Take 1 tablet by mouth daily.   . Multiple Vitamins-Minerals (ICAPS AREDS 2) CAPS Take 1 capsule by mouth 2 (two) times daily.   . polyethylene glycol (MIRALAX / GLYCOLAX) packet Take 17 g by mouth daily as needed for moderate constipation.  12/13/2016: Uses it about every other day (forgets to take it daily)  . verapamil (CALAN-SR) 240 MG CR tablet TAKE 1 TABLET(240 MG) BY MOUTH DAILY   . [DISCONTINUED] digoxin (LANOXIN) 0.125 MG tablet TAKE 1 TABLET(125 MCG) BY MOUTH DAILY   . VALIUM 2 MG tablet TAKE 1 TABLET BY MOUTH EVERY 8 HOURS FOR MUSCLE SPASMS (Patient not taking: Reported on 04/02/2019) 04/02/2019: Only as need that not that often   No facility-administered encounter medications on file as of 04/02/2019.   Allergies  Allergen Reactions  . Azithromycin Nausea Only  . Arimidex [Anastrozole] Rash  . Avelox [Moxifloxacin Hcl In Nacl] Nausea Only and Rash  . Bactrim Nausea Only and Rash  .  Biaxin [Clarithromycin] Nausea Only and Rash  . Macrobid [Nitrofurantoin] Nausea Only and Rash  . Penicillins Nausea Only and Rash    Has patient had a PCN reaction causing immediate rash, facial/tongue/throat swelling, SOB or lightheadedness with hypotension: no Has patient had a PCN reaction causing severe rash involving mucus membranes or skin necrosis: no Has patient had a PCN reaction that required hospitalization no Has patient had a PCN reaction occurring within the last 10 years no If all of the above answers are "NO", then may proceed with Cephalosporin use.   . Sulfa Antibiotics Nausea And Vomiting and Rash    ROS: The patient denies  fever, headaches, vision or hearingchanges (improves after cerumen removed by ENT, last 10/2018),URI or allergy symptoms. Denieschest pain, palpitations, dizziness, syncope, dyspnea on exertion. Denies edema. Denies GI or GU complaints.Nonumbness, tingling, weakness, tremor, suspicious skin lesions, depression, anxiety, abnormal bleeding/bruising, or enlarged lymph nodes. L knee pain, improved after recent injection Constipation --controlled by Miralax. Decreased appetite and weight loss    PHYSICAL EXAM:  BP 126/80   Pulse 84   Temp 97.6 F (36.4 C)   Ht 5' 3"  (1.6 m)   Wt 111 lb 6.4 oz (50.5 kg)   LMP  (LMP Unknown)   BMI 19.73 kg/m   Wt Readings from Last 3 Encounters:  04/02/19 111 lb 6.4 oz (50.5 kg)  10/21/18 117 lb 3.2 oz (53.2 kg)  08/21/18 116 lb 6.4 oz (52.8 kg)    She declines breast exam (done by Dr. [name]); declines pelvic exam. Declines changing into gown today.  General Appearance:   Alert, cooperative, no distress, appears somewhat younger than stated age  Head:   Normocephalic, without obvious abnormality, atraumatic  Eyes:   PERRL, conjunctiva/corneas clear, EOM's intact  Ears:   Normal external ear canals and TM's. Left was partially obstructed by cerumen  Nose:  Not examined, wearing mask due to COVID-19 pandemic  Throat:  Not examined, wearing mask due to COVID-19 pandemic  Neck:  Supple, no lymphadenopathy; thyroid: noenlargement/ tenderness/nodules; no carotid bruit or JVD  Back:  Spine nontender, no curvature, ROM normal, no CVA tenderness  Lungs:   Clear to auscultation bilaterally without wheezes, rales or ronchi; respirations unlabored  Chest Wall:   No tenderness or deformity  Heart:   Regular rate and rhythm, S1 and S2 normal, no murmur, rubor gallop  Breast Exam:   Exam declined by patient (deferred to oncologist)  Abdomen:   Soft, non-tender, nondistended, normoactive bowel sounds, no masses, no  hepatosplenomegaly. suprapubic catheter in place, tubing taped over lower abdomen. Normal skin, no erythema  Genitalia:  Declined by patient  Rectal:  Declined by patient  Extremities:  No clubbing, cyanosis or edema.   Pulses:  2+ and symmetric all extremities  Skin:  Skin color, texture, turgor normal, no rashes or lesions  Lymph nodes:  Cervical, supraclavicular, and axillary nodes normal  Neurologic: Normal strength, sensation and gait; reflexes 2+ and symmetric throughout   Psych: Normal mood, affect, hygiene and grooming   ASSESSMENT/PLAN:  Medicare annual wellness visit, subsequent  Essential hypertension, benign - well controlled - Plan: Comprehensive metabolic panel  Mixed hyperlipidemia - elevated TG, intolerant of fish oil. Diet reviewed - Plan: Lipid panel  Osteoporosis without current pathological fracture, unspecified osteoporosis type - cont Prolia, Ca, D. Encouraged weight-bearing exercise  Atony of bladder - doing well with catheter, without complication  Medication monitoring encounter - Plan: Lipid panel, Comprehensive metabolic panel  Weight loss - with  decreased appetite, suspect inadequate intake. Reviewed diet, encouraged eating q2 hours, protein shakes/supplements - Plan: TSH  Palpitations - well controlled with CCB and digoxin, x years. Cont current regimen - Plan: digoxin (LANOXIN) 0.125 MG tablet  Presence of suprapubic catheter ([place])  Counseled extensively regarding diet, and feelings of loneliness/isolation related to the pandemic.  She is not depressed. We discussed ways to stay connected, make the most of each day. Counseled regarding staying safe in [place], and about vaccination.  mammo and DEXA are due (ordered by Dr. [name]). Pt scheduled mammogram, but hasn't scheduled DEXA. Reminded to do so.   Discussed monthly self breast exams and yearly mammograms; at least 30 minutes of aerobic activity at least 5  days/week, weight-bearing exercise 2x/week; proper sunscreen use reviewed; healthy diet, including goals of calcium and vitamin D intake and alcohol recommendations (less than or equal to 1 drink/day) reviewed; regular seatbelt use; changing batteries in smoke detectors. Immunization recommendations discussed: continue yearly high dose flu shots. Shingrix recommended, discussed risks/side effects, to get from pharmacy.COVID vaccine recommended, given information on how/where to get. Colonoscopy recommendations reviewed, UTD.  MOST form signed, Full Code, Full Care Patient has Living Will and healthcare POA  Total visit time 45 mins; extensive counseling re: moods, diet, meds, vaccines.    Medicare Attestation I have personally reviewed: The patient's medical and social history Their use of alcohol, tobacco or illicit drugs Their current medications and supplements The patient's functional ability including ADLs,fall risks, home safety risks, cognitive, and hearing and visual impairment Diet and physical activities Evidence for depression or mood disorders  The patient's weight, height, BMI have been recorded in the chart.  I have made referrals, counseling, and provided education to the patient based on review of the above and I have provided the patient with a written personalized care plan for preventive services.

## 2019-04-01 NOTE — Patient Instructions (Addendum)
HEALTH MAINTENANCE RECOMMENDATIONS:  It is recommended that you get at least 30 minutes of aerobic exercise at least 5 days/week (for weight loss, you may need as much as 60-90 minutes). This can be any activity that gets your heart rate up. This can be divided in 10-15 minute intervals if needed, but try and build up your endurance at least once a week.  Weight bearing exercise is also recommended twice weekly.  Eat a healthy diet with lots of vegetables, fruits and fiber.  "Colorful" foods have a lot of vitamins (ie green vegetables, tomatoes, red peppers, etc).  Limit sweet tea, regular sodas and alcoholic beverages, all of which has a lot of calories and sugar.  Up to 1 alcoholic drink daily may be beneficial for women (unless trying to lose weight, watch sugars).  Drink a lot of water.  Calcium recommendations are 1200-1500 mg daily (1500 mg for postmenopausal women or women without ovaries), and vitamin D 1000 IU daily.  This should be obtained from diet and/or supplements (vitamins), and calcium should not be taken all at once, but in divided doses.  Monthly self breast exams and yearly mammograms for women over the age of 57 is recommended.  Sunscreen of at least SPF 30 should be used on all sun-exposed parts of the skin when outside between the hours of 10 am and 4 pm (not just when at beach or pool, but even with exercise, golf, tennis, and yard work!)  Use a sunscreen that says "broad spectrum" so it covers both UVA and UVB rays, and make sure to reapply every 1-2 hours.  Remember to change the batteries in your smoke detectors when changing your clock times in the spring and fall. Carbon monoxide detectors are recommended for your home.  Use your seat belt every time you are in a car, and please drive safely and not be distracted with cell phones and texting while driving.   Mary [name] , Thank you for taking time to come for your Medicare Wellness Visit. I appreciate your ongoing  commitment to your health goals. Please review the following plan we discussed and let me know if I can assist you in the future.    This is a list of the screening recommended for you and due dates:  Health Maintenance  Topic Date Due  . Tetanus Vaccine  09/03/2021  . Flu Shot  Completed  . DEXA scan (bone density measurement)  Completed  . Pneumonia vaccines  Completed   You are currently due for mammogram and bone density test. You are scheduled for the  Mammogram, be sure to also schedule the bone density test.  I recommend getting the new shingles vaccine (Shingrix). You will need to get this from the pharmacy rather than our office, as it is covered by Medicare Part D.  It is a series of 2 injections, spaced 2 months apart.  COVID vaccine is recommended.  You are eligible to get this now, so should call or get online to try and get an appointment.  COVID-19 Vaccine Information can be found at: ShippingScam.co.uk For questions related to vaccine distribution or appointments, please email vaccine@[email or web address] or call [phone].   You can also get information through the health department, and [email or web address]  Dr. [name] had mentioned possibly delaying your next Prolia injection (due in February) until May, allowing you time to get vaccinated so it is safer for you to be out. If you can get the vaccines soon (2 doses, spaced 3 weeks  apart), then you won't need to delay it by much, if at all.  Be sure to eat enough food to prevent further weight loss.  I recommend eating something every 2 hours (even if just a snack, protein shake or bars--examples include Muscle Milk, Power Crunch bars, there are many different types, just a matter of which ones sound good to you). Be sure to get adequate protein in your diet--snack on cheese, unsalted nuts. Try cooking some more fish that you like or getting prepared meals.  Since it  is too cold to walk outside, please try and get some regular exercise (walk in place while watching TV, even seated exercises, if needed due to knee pain) every day.  We discussed using some light weights/cans while exercising to help your bones, at least 2x/week).  Continue to try and limit your outings to minimize COVID exposures. Continue to do your puzzles which you enjoy, and maintain contact with your friends and family.  Please let us know if your shortness of breath with exercise/hills is getting worse, as that would need further evaluation of your heart.

## 2019-04-02 ENCOUNTER — Ambulatory Visit (INDEPENDENT_AMBULATORY_CARE_PROVIDER_SITE_OTHER): Payer: Medicare Other | Admitting: Family Medicine

## 2019-04-02 ENCOUNTER — Other Ambulatory Visit: Payer: Self-pay

## 2019-04-02 ENCOUNTER — Encounter: Payer: Self-pay | Admitting: Family Medicine

## 2019-04-02 VITALS — BP 126/80 | HR 84 | Temp 97.6°F | Ht 63.0 in | Wt 111.4 lb

## 2019-04-02 DIAGNOSIS — E782 Mixed hyperlipidemia: Secondary | ICD-10-CM | POA: Diagnosis not present

## 2019-04-02 DIAGNOSIS — R634 Abnormal weight loss: Secondary | ICD-10-CM | POA: Diagnosis not present

## 2019-04-02 DIAGNOSIS — Z9359 Other cystostomy status: Secondary | ICD-10-CM

## 2019-04-02 DIAGNOSIS — I10 Essential (primary) hypertension: Secondary | ICD-10-CM

## 2019-04-02 DIAGNOSIS — Z0000 Encounter for general adult medical examination without abnormal findings: Secondary | ICD-10-CM

## 2019-04-02 DIAGNOSIS — R002 Palpitations: Secondary | ICD-10-CM | POA: Diagnosis not present

## 2019-04-02 DIAGNOSIS — N312 Flaccid neuropathic bladder, not elsewhere classified: Secondary | ICD-10-CM

## 2019-04-02 DIAGNOSIS — Z5181 Encounter for therapeutic drug level monitoring: Secondary | ICD-10-CM | POA: Diagnosis not present

## 2019-04-02 DIAGNOSIS — M810 Age-related osteoporosis without current pathological fracture: Secondary | ICD-10-CM

## 2019-04-02 MED ORDER — DIGOXIN 125 MCG PO TABS: ORAL_TABLET | ORAL | 1 refills | Status: DC

## 2019-04-03 ENCOUNTER — Encounter: Payer: Self-pay | Admitting: Family Medicine

## 2019-04-03 LAB — COMPREHENSIVE METABOLIC PANEL
ALT: 12 IU/L (ref 0–32)
AST: 13 IU/L (ref 0–40)
Albumin/Globulin Ratio: 1.8 (ref 1.2–2.2)
Albumin: 4.4 g/dL (ref 3.5–4.6)
Alkaline Phosphatase: 67 IU/L (ref 39–117)
BUN/Creatinine Ratio: 17 (ref 12–28)
BUN: 13 mg/dL (ref 10–36)
Bilirubin Total: 0.6 mg/dL (ref 0.0–1.2)
CO2: 25 mmol/L (ref 20–29)
Calcium: 9.8 mg/dL (ref 8.7–10.3)
Chloride: 102 mmol/L (ref 96–106)
Creatinine, Ser: 0.77 mg/dL (ref 0.57–1.00)
GFR calc Af Amer: 79 mL/min/{1.73_m2} (ref >59)
GFR calc non Af Amer: 68 mL/min/{1.73_m2} (ref >59)
Globulin, Total: 2.4 g/dL (ref 1.5–4.5)
Glucose: 100 mg/dL — ABNORMAL HIGH (ref 65–99)
Potassium: 4.5 mmol/L (ref 3.5–5.2)
Sodium: 144 mmol/L (ref 134–144)
Total Protein: 6.8 g/dL (ref 6.0–8.5)

## 2019-04-03 LAB — LIPID PANEL
Chol/HDL Ratio: 2.8 ratio (ref 0.0–4.4)
Cholesterol, Total: 190 mg/dL (ref 100–199)
HDL: 69 mg/dL (ref >39)
LDL Chol Calc (NIH): 96 mg/dL (ref 0–99)
Triglycerides: 145 mg/dL (ref 0–149)
VLDL Cholesterol Cal: 25 mg/dL (ref 5–40)

## 2019-04-03 LAB — TSH: TSH: 1.36 u[IU]/mL (ref 0.450–4.500)

## 2019-04-04 DIAGNOSIS — Z1231 Encounter for screening mammogram for malignant neoplasm of breast: Secondary | ICD-10-CM | POA: Diagnosis not present

## 2019-04-04 LAB — HM MAMMOGRAPHY

## 2019-04-09 ENCOUNTER — Encounter: Payer: Self-pay | Admitting: *Deleted

## 2019-04-16 ENCOUNTER — Telehealth: Payer: Self-pay | Admitting: *Deleted

## 2019-04-16 NOTE — Telephone Encounter (Signed)
Dr. [name] always orders her DEXAs--he just put it in for wrong location. We can go ahead and order, but please make sure that he also gets cc'd the result.

## 2019-04-16 NOTE — Telephone Encounter (Signed)
Done

## 2019-04-16 NOTE — Telephone Encounter (Signed)
Patient called and left me a vm stating that she scheduled her DEXA at [facility] but she needs you to fax an order. I see in her chart where Dr. [name] ordered one for [facility], can I send one to [facility] for her? Please advise, thanks.

## 2019-04-27 ENCOUNTER — Ambulatory Visit: Payer: Medicare Other

## 2019-04-28 DIAGNOSIS — N312 Flaccid neuropathic bladder, not elsewhere classified: Secondary | ICD-10-CM | POA: Diagnosis not present

## 2019-04-28 DIAGNOSIS — R338 Other retention of urine: Secondary | ICD-10-CM | POA: Diagnosis not present

## 2019-04-30 DIAGNOSIS — M810 Age-related osteoporosis without current pathological fracture: Secondary | ICD-10-CM | POA: Diagnosis not present

## 2019-04-30 LAB — HM DEXA SCAN

## 2019-05-18 ENCOUNTER — Ambulatory Visit: Payer: Medicare Other

## 2019-05-19 ENCOUNTER — Telehealth: Payer: Self-pay | Admitting: *Deleted

## 2019-05-19 NOTE — Telephone Encounter (Signed)
Patient called and her son got his second COVID vaccine last Saturday and she got hers last Thursday. Her son wants to fly from [place] and come visit, she wants to know if you think this is safe.

## 2019-05-19 NOTE — Telephone Encounter (Signed)
Spoke with patient and answered questions.  Recommended waiting 2 weeks after vaccines

## 2019-05-26 DIAGNOSIS — N312 Flaccid neuropathic bladder, not elsewhere classified: Secondary | ICD-10-CM | POA: Diagnosis not present

## 2019-05-28 DIAGNOSIS — H3540 Unspecified peripheral retinal degeneration: Secondary | ICD-10-CM | POA: Diagnosis not present

## 2019-05-28 DIAGNOSIS — H442E3 Degenerative myopia with other maculopathy, bilateral eye: Secondary | ICD-10-CM | POA: Diagnosis not present

## 2019-05-28 DIAGNOSIS — H43813 Vitreous degeneration, bilateral: Secondary | ICD-10-CM | POA: Diagnosis not present

## 2019-05-28 DIAGNOSIS — H15833 Staphyloma posticum, bilateral: Secondary | ICD-10-CM | POA: Diagnosis not present

## 2019-06-08 ENCOUNTER — Other Ambulatory Visit: Payer: Self-pay | Admitting: Family Medicine

## 2019-06-08 DIAGNOSIS — E782 Mixed hyperlipidemia: Secondary | ICD-10-CM

## 2019-06-08 DIAGNOSIS — R002 Palpitations: Secondary | ICD-10-CM

## 2019-06-23 DIAGNOSIS — N312 Flaccid neuropathic bladder, not elsewhere classified: Secondary | ICD-10-CM | POA: Diagnosis not present

## 2019-07-10 DIAGNOSIS — H6123 Impacted cerumen, bilateral: Secondary | ICD-10-CM | POA: Diagnosis not present

## 2019-07-21 DIAGNOSIS — N312 Flaccid neuropathic bladder, not elsewhere classified: Secondary | ICD-10-CM | POA: Diagnosis not present
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
## 2019-08-04 ENCOUNTER — Inpatient Hospital Stay: Payer: Medicare Other

## 2019-08-04 ENCOUNTER — Inpatient Hospital Stay: Payer: Medicare Other | Attending: Oncology | Admitting: Oncology

## 2019-08-04 ENCOUNTER — Other Ambulatory Visit: Payer: Self-pay

## 2019-08-04 ENCOUNTER — Encounter: Payer: Self-pay | Admitting: Oncology

## 2019-08-04 VITALS — BP 187/97 | HR 81 | Temp 98.5°F | Resp 18 | Ht 63.0 in | Wt 113.3 lb

## 2019-08-04 DIAGNOSIS — Z170 Estrogen receptor positive status [ER+]: Secondary | ICD-10-CM

## 2019-08-04 DIAGNOSIS — Z853 Personal history of malignant neoplasm of breast: Secondary | ICD-10-CM | POA: Insufficient documentation

## 2019-08-04 DIAGNOSIS — M818 Other osteoporosis without current pathological fracture: Secondary | ICD-10-CM | POA: Diagnosis not present

## 2019-08-04 DIAGNOSIS — C50411 Malignant neoplasm of upper-outer quadrant of right female breast: Secondary | ICD-10-CM

## 2019-08-04 DIAGNOSIS — M810 Age-related osteoporosis without current pathological fracture: Secondary | ICD-10-CM | POA: Insufficient documentation

## 2019-08-04 LAB — COMPREHENSIVE METABOLIC PANEL
ALT: 15 U/L (ref 0–44)
AST: 16 U/L (ref 15–41)
Albumin: 3.9 g/dL (ref 3.5–5.0)
Alkaline Phosphatase: 68 U/L (ref 38–126)
Anion gap: 9 (ref 5–15)
BUN: 16 mg/dL (ref 8–23)
CO2: 31 mmol/L (ref 22–32)
Calcium: 9.9 mg/dL (ref 8.9–10.3)
Chloride: 100 mmol/L (ref 98–111)
Creatinine, Ser: 0.83 mg/dL (ref 0.44–1.00)
GFR calc Af Amer: >60 mL/min (ref >60)
GFR calc non Af Amer: >60 mL/min (ref >60)
Glucose, Bld: 95 mg/dL (ref 70–99)
Potassium: 3.9 mmol/L (ref 3.5–5.1)
Sodium: 140 mmol/L (ref 135–145)
Total Bilirubin: 0.5 mg/dL (ref 0.3–1.2)
Total Protein: 7.3 g/dL (ref 6.5–8.1)

## 2019-08-04 LAB — CBC WITH DIFFERENTIAL/PLATELET
Abs Immature Granulocytes: 0.02 10*3/uL (ref 0.00–0.07)
Basophils Absolute: 0.1 10*3/uL (ref 0.0–0.1)
Basophils Relative: 1 %
Eosinophils Absolute: 0.2 10*3/uL (ref 0.0–0.5)
Eosinophils Relative: 3 %
HCT: 39.8 % (ref 36.0–46.0)
Hemoglobin: 12.7 g/dL (ref 12.0–15.0)
Immature Granulocytes: 0 %
Lymphocytes Relative: 22 %
Lymphs Abs: 1.4 10*3/uL (ref 0.7–4.0)
MCH: 28.7 pg (ref 26.0–34.0)
MCHC: 31.9 g/dL (ref 30.0–36.0)
MCV: 89.8 fL (ref 80.0–100.0)
Monocytes Absolute: 0.7 10*3/uL (ref 0.1–1.0)
Monocytes Relative: 10 %
Neutro Abs: 4.2 10*3/uL (ref 1.7–7.7)
Neutrophils Relative %: 64 %
Platelets: 209 10*3/uL (ref 150–400)
RBC: 4.43 MIL/uL (ref 3.87–5.11)
RDW: 14.4 % (ref 11.5–15.5)
WBC: 6.6 10*3/uL (ref 4.0–10.5)
nRBC: 0 % (ref 0.0–0.2)

## 2019-08-04 MED ORDER — DENOSUMAB 60 MG/ML ~~LOC~~ SOSY
60.0000 mg | PREFILLED_SYRINGE | Freq: Once | SUBCUTANEOUS | Status: AC
  Administered 2019-08-04: 60 mg via SUBCUTANEOUS

## 2019-08-04 MED ORDER — DENOSUMAB 60 MG/ML ~~LOC~~ SOSY
PREFILLED_SYRINGE | SUBCUTANEOUS | Status: AC
  Filled 2019-08-04: qty 1

## 2019-08-04 NOTE — Patient Instructions (Signed)

## 2019-08-05 ENCOUNTER — Telehealth: Payer: Self-pay | Admitting: Oncology

## 2019-08-05 NOTE — Telephone Encounter (Signed)
Scheduled appts per 5/17 los. Left voicemail with appt date and time. Sent msg to HIM to mail appt reminder and calendar.

## 2019-08-11 DIAGNOSIS — M1712 Unilateral primary osteoarthritis, left knee: Secondary | ICD-10-CM | POA: Diagnosis not present

## 2019-08-13 DIAGNOSIS — H26493 Other secondary cataract, bilateral: Secondary | ICD-10-CM | POA: Diagnosis not present

## 2019-08-13 DIAGNOSIS — H4423 Degenerative myopia, bilateral: Secondary | ICD-10-CM | POA: Diagnosis not present

## 2019-08-13 DIAGNOSIS — H524 Presbyopia: Secondary | ICD-10-CM | POA: Diagnosis not present

## 2019-08-13 DIAGNOSIS — H5000 Unspecified esotropia: Secondary | ICD-10-CM | POA: Diagnosis not present

## 2019-08-19 DIAGNOSIS — N312 Flaccid neuropathic bladder, not elsewhere classified: Secondary | ICD-10-CM | POA: Diagnosis not present

## 2019-09-07 ENCOUNTER — Other Ambulatory Visit: Payer: Self-pay | Admitting: Family Medicine

## 2019-09-07 DIAGNOSIS — E782 Mixed hyperlipidemia: Secondary | ICD-10-CM

## 2019-09-07 DIAGNOSIS — R002 Palpitations: Secondary | ICD-10-CM

## 2019-09-08 NOTE — Telephone Encounter (Signed)
Has an appt in july ?

## 2019-09-17 ENCOUNTER — Ambulatory Visit (INDEPENDENT_AMBULATORY_CARE_PROVIDER_SITE_OTHER): Payer: Medicare Other | Admitting: Family Medicine

## 2019-09-17 ENCOUNTER — Encounter: Payer: Self-pay | Admitting: Family Medicine

## 2019-09-17 ENCOUNTER — Other Ambulatory Visit: Payer: Self-pay

## 2019-09-17 VITALS — BP 160/100 | HR 88 | Temp 98.7°F | Resp 20 | Ht 63.0 in | Wt 113.4 lb

## 2019-09-17 DIAGNOSIS — R0789 Other chest pain: Secondary | ICD-10-CM

## 2019-09-17 DIAGNOSIS — Z853 Personal history of malignant neoplasm of breast: Secondary | ICD-10-CM | POA: Diagnosis not present

## 2019-09-17 DIAGNOSIS — M810 Age-related osteoporosis without current pathological fracture: Secondary | ICD-10-CM | POA: Diagnosis not present

## 2019-09-17 DIAGNOSIS — I10 Essential (primary) hypertension: Secondary | ICD-10-CM

## 2019-09-17 DIAGNOSIS — R630 Anorexia: Secondary | ICD-10-CM

## 2019-09-17 NOTE — Progress Notes (Signed)
Chief Complaint  Patient presents with  . Rash    one week ago noticed bump under right arm, breast area-near mastecomy area.    Patient presents accompanied by her daughter Mardene Celeste, with complaint of sensitivity and possible rash to her right chest wall. She reports that about a week ago she felt sensitivity at the right lateral breast area.  She did not see a visible rash. She noticed increase in pain yesterday while wearing a bra, prompting her to make appointment. Since COVID, she hasn't been wearing a bra often. She denies any itching, change in products, not aware of any chafing/irritation.  She did not try anything for the discomfort--no topical medications or tylenol.  She reports being UTD with visits to Dr. Ancil Boozer seen in 07/2019.  She does report decreased appetite, thinks she has lost weight.   Daughter mentions she has stressors--mainly related to that she doesn't really want to travel to [place], related to the hassle related to her catheter.   They both had questions regarding her Prolia and her last DEXA. Results were reviewed, as well as Dr. Virgie Dad interpretation ("close to stable", no change in treatment).   PMH, PSH, SH reviewed  Outpatient Encounter Medications as of 09/17/2019  Medication Sig Note  . atorvastatin (LIPITOR) 40 MG tablet TAKE 1 TABLET(40 MG) BY MOUTH EVERY EVENING   . Calcium Carbonate (CALTRATE 600 PO) Take 1 tablet by mouth daily.   . Cholecalciferol (VITAMIN D3) 2000 UNITS TABS Take 2,000 Units by mouth daily.   Marland Kitchen denosumab (PROLIA) 60 MG/ML SOLN injection Inject 60 mg into the skin See admin instructions. Administer in upper arm, thigh, or abdomen 12/13/2016: Every 6 months  . digoxin (LANOXIN) 0.125 MG tablet TAKE 1 TABLET(125 MCG) BY MOUTH DAILY   . Multiple Vitamins-Minerals (CENTRUM ADULTS PO) Take 1 tablet by mouth daily.   . Multiple Vitamins-Minerals (ICAPS AREDS 2) CAPS Take 1 capsule by mouth 2 (two) times daily.   .  polyethylene glycol (MIRALAX / GLYCOLAX) packet Take 17 g by mouth daily as needed for moderate constipation.  12/13/2016: Uses it about every other day (forgets to take it daily)  . verapamil (CALAN-SR) 240 MG CR tablet TAKE 1 TABLET(240 MG) BY MOUTH DAILY   . hydrocortisone (ANUSOL-HC) 2.5 % rectal cream APPLY EXTERNALLY TO THE AFFECTED AREA TWICE DAILY (Patient not taking: Reported on 09/17/2019)   . loratadine (CLARITIN) 10 MG tablet Take 10 mg by mouth daily as needed for allergies.  (Patient not taking: Reported on 09/17/2019)   . VALIUM 2 MG tablet TAKE 1 TABLET BY MOUTH EVERY 8 HOURS FOR MUSCLE SPASMS (Patient not taking: Reported on 04/02/2019) 04/02/2019: Only as need that not that often   No facility-administered encounter medications on file as of 09/17/2019.   Allergies  Allergen Reactions  . Azithromycin Nausea Only  . Arimidex [Anastrozole] Rash  . Avelox [Moxifloxacin Hcl In Nacl] Nausea Only and Rash  . Bactrim Nausea Only and Rash  . Biaxin [Clarithromycin] Nausea Only and Rash  . Macrobid [Nitrofurantoin] Nausea Only and Rash  . Penicillins Nausea Only and Rash    Has patient had a PCN reaction causing immediate rash, facial/tongue/throat swelling, SOB or lightheadedness with hypotension: no Has patient had a PCN reaction causing severe rash involving mucus membranes or skin necrosis: no Has patient had a PCN reaction that required hospitalization no Has patient had a PCN reaction occurring within the last 10 years no If all of the above answers are "NO", then may  proceed with Cephalosporin use.   . Sulfa Antibiotics Nausea And Vomiting and Rash   ROS:  No fever, chills, cough, SOB, chest pain, headaches, dizziness. No falls, trauma or change in activity. Decreased appetite per HPI.    PHYSICAL EXAM:  BP (!) 160/100   Pulse 88   Temp 98.7 F (37.1 C) (Tympanic)   Resp 20   Ht 5\' 3"  (1.6 m)   Wt 113 lb 6.4 oz (51.4 kg)   LMP  (LMP Unknown)   BMI 20.09 kg/m   Wt  Readings from Last 3 Encounters:  09/17/19 113 lb 6.4 oz (51.4 kg)  08/04/19 113 lb 4.8 oz (51.4 kg)  04/02/19 111 lb 6.4 oz (50.5 kg)   There seemed to be a little tension in the room with he daughter in discussing her decreased appetite, daughter mentioning stressors, and patient expressing why she didn't really want to travel to [place]. Otherwise she seemed calm, pleasant, and in no distress  Examination of the right chest wall and axilla--WHSS. Area of sensitivity appears normal, without any rash or lesions. There are a few very small papules that are slightly erythematous more inferior on the lateral chest wall, but this is nontender. There is no erythema, vesicles or other visible rash in the area of her concern. There are no soft tissue masses, and no axillary lymphadenopathy. Psych:  Normal mood, affect, hygiene, grooming, speech and eye contact. Neuro: alert and oriented, normal gait.   ASSESSMENT/PLAN:  Chest wall discomfort - L side. No e/o rash or irritation, just subjectively sensitive. Given length of time present, and no rash, doubt shingles. Reassured  Decreased appetite - reassured no e/o wt loss. Decreased appetite for dinner only. Reviewed healthy diet.  Osteoporosis without current pathological fracture, unspecified osteoporosis type - continue Prolia injections, Ca, D, weight-bearing exercise  History of breast cancer - R breast.  No evidence to suggest a recurrent problem. f/u if pain persists/worsens for further evaluation/imaging  Essential hypertension, benign - BP elevated today. ?component of WC. Needs to monitor at home to determine if BP adequately controlled. Low Na diet   If ongoing issues with appetite, if any weight loss, or any depression, to return to discuss in further detail at a longer visit. The stressors mentioned by daughter seems more that the patient chooses not to travel now, and with her catheter and age of 60, her wishes/preference probably  needs to be understood by her family.  Did discuss that her Prolia injections should not be delayed/late  30 min FTF visit, plus additional time in chart review, documentation.   Use tylenol if needed for pain. Keep with loose clothing for now. I do not see any evidence of shingles based on exam. If things change, please let me know (ie rash, bumps, spreading of pain, or any other changes).  Consider using a cooling/topical agent such as [employer] or Biofreeze. You can try heat versus ice, whichever feels better.  Consider counseling to help deal with any ongoing stressors.  Your blood pressure was elevated today. Continue to limit the sodium in your diet. Try and check your blood pressure elsewhere (to ensure that it truly is only high at the doctor, and not more often). [employer] and [employer] ([employer] brand?) are both good monitors. Bring the monitor with you to your next appointment so that we can verify the accuracy. Keep a written log and bring the paper with you to your visits.

## 2019-09-17 NOTE — Patient Instructions (Signed)
  Use tylenol if needed for pain. Keep with loose clothing for now. I do not see any evidence of shingles based on exam. If things change, please let me know (ie rash, bumps, spreading of pain, or any other changes).  Consider using a cooling/topical agent such as [employer] or Biofreeze. You can try heat versus ice, whichever feels better.  Consider counseling to help deal with any ongoing stressors.  Your blood pressure was elevated today. Continue to limit the sodium in your diet. Try and check your blood pressure elsewhere (to ensure that it truly is only high at the doctor, and not more often). Omron and Reli-On ([employer] brand?) are both good monitors. Bring the monitor with you to your next appointment so that we can verify the accuracy. Keep a written log and bring the paper with you to your visits.

## 2019-09-18 ENCOUNTER — Encounter: Payer: Self-pay | Admitting: Family Medicine

## 2019-09-22 ENCOUNTER — Other Ambulatory Visit: Payer: Self-pay | Admitting: Family Medicine

## 2019-09-22 DIAGNOSIS — R002 Palpitations: Secondary | ICD-10-CM

## 2019-09-23 NOTE — Telephone Encounter (Signed)
Calling pt to see if needs refill before appt

## 2019-09-30 NOTE — Progress Notes (Signed)
Chief Complaint  Patient presents with  . other    med check    Patient was seen 2 weeks ago with sensitivity/pain to R lateral chest wall. She reports that this discomfort has completely resolved. At that time, her BP was noted to be elevated (she was accompanied by her daughter Mardene Celeste). She was encouraged to limit the sodium in her diet.  Daughter was going to get a BP monitor, monitor regularly and was advised to bring the monitor and list of BP's to her visit. She states she has a monitor, doesn't know how to use it, hasn't been checking BP at home. She denies dizziness, headaches, chest pain. Denies side effects of medications.She continues on Verapamil and Digoxin for hypertension and palpitations.Digoxin levels are monitored yearly, have been normal, due now.Palpitations are well controlled on her current med regimen, and is tolerating generic digoxin without problems.She is hesitant to make any changes to her regimen that has been working so well for many years.  Decreased appetite.  She mentioned this at her recent visit, as well as 6 months ago.  She is a little picky, and doesn't like leftovers. She recently started eating out some again.  In January she reported drinking Carnation drinks sometimes instead of lunch if she ate a late breakfast.  She had mentioned concern of weight loss at her last visit, but no weight loss has been noted here. She continues to report some decreased appetite. Wt Readings from Last 3 Encounters:  09/17/19 113 lb 6.4 oz (51.4 kg)  08/04/19 113 lb 4.8 oz (51.4 kg)  04/02/19 111 lb 6.4 oz (50.5 kg)   There was mention of stress when daughter was here.  Patient had reported that she was not interested in traveling to [place] anytime soon, reports the hassle of the catheter. At her January visit she was feeling lonely, but no hopelessness, no trouble sleeping.  She is doing more now, going out more. She is troubled by some decisions she has to make and some  pressure on her. Her daughter in [place]) wants her to move there. She has 2 grandsons there (one of which is expecting 2nd child this week,and had emergency appendectomy last night, so she is a little distracted and worried about him today). She states she has a deposit on [facility], here in [place], for when she doesn't think she should live alone in her house anymore.  She really is quite happy in [place], is involved in temple, has friends.  She has her niece/nephew nearby.  She is concerned because Susie has a place in [place] that she goes to 2-3 mos/year, and her grandsons are busy with their lives. She doesn't know anyone other than her family in [place].  She states she 'Gets her depressed thinking about this"-- She feels lonely.  She can't even think about cleaning out her house to move. She has so much stuff that she needs to get rid of, doesn't know where to start or what to do with everything. She is worried about change, and doesn't want to have to depend on [name] and Elenore Rota (niece and nephew here). Marland Kitchen "I know I shouldn't be living in my house by myself anymore." Thinks it might be time for a change, but feels overwhelmed by the thought of having to pack up her house and move.  Hyperlipidemia follow-up: Patient is reportedly following a low cholesterol diet. Compliant with medications(atorvastatin 40mg )and denies medication side effects.She is no longer taking fish oil, as she  had a hard time swallowing it; continues onAREDs. Last lipids were at goal: Lab Results  Component Value Date   CHOL 190 04/02/2019   HDL 69 04/02/2019   LDLCALC 96 04/02/2019   TRIG 145 04/02/2019   CHOLHDL 2.8 04/02/2019   Urinary retention--she has suprapubic catheter. Shedrains itabout every2-3 hours.Shegoes to theurologist's officeonce a month to have the catheter changed.Denies pain, abnormal urine. Denies cloudy or bloody urine, no odor.  Osteoporosis: She was started on Prolia injections by  Dr. [name] in July 2014. Last DEXA was in 04/2019. She gets injections through Dr. Virgie Dad office and is UTD. Not getting any weight-bearing exercise.  She is taking calcium and vitamin D. She only takes caltrate once daily due to constipation (was worse when taking BID).  She continues to eata lot of dairy products (cottage cheese, yogurt.)  H/o breast cancer:  She is >12 years from diagnosis/treatment, still sees Dr. [name] yearly. She last saw him in May. Mammogram was in January.  She denies any breast concerns or complaints, other than the skin discomforted noted at last visit, which has resolved.  Left knee pain--s/p injections in the left knee, under the care of Dr. Wynelle Link. She has been told that she has bone on bone. Injections have been helpful in the past.  Last one was in May, now denies any pain. Only rarely needs Advil, only if Tylenol doesn't help.  PMH, PSH, SH reviewed  Outpatient Encounter Medications as of 10/01/2019  Medication Sig Note  . atorvastatin (LIPITOR) 40 MG tablet TAKE 1 TABLET(40 MG) BY MOUTH EVERY EVENING   . Calcium Carbonate (CALTRATE 600 PO) Take 1 tablet by mouth daily.   . Cholecalciferol (VITAMIN D3) 2000 UNITS TABS Take 2,000 Units by mouth daily.   Marland Kitchen denosumab (PROLIA) 60 MG/ML SOLN injection Inject 60 mg into the skin See admin instructions. Administer in upper arm, thigh, or abdomen 12/13/2016: Every 6 months  . digoxin (LANOXIN) 0.125 MG tablet TAKE 1 TABLET(125 MCG) BY MOUTH DAILY   . Multiple Vitamins-Minerals (CENTRUM ADULTS PO) Take 1 tablet by mouth daily.   . Multiple Vitamins-Minerals (ICAPS AREDS 2) CAPS Take 1 capsule by mouth 2 (two) times daily.   . polyethylene glycol (MIRALAX / GLYCOLAX) packet Take 17 g by mouth daily as needed for moderate constipation.  12/13/2016: Uses it about every other day (forgets to take it daily)  . verapamil (CALAN-SR) 240 MG CR tablet TAKE 1 TABLET(240 MG) BY MOUTH DAILY   . [DISCONTINUED] digoxin (LANOXIN)  0.125 MG tablet TAKE 1 TABLET(125 MCG) BY MOUTH DAILY   . clindamycin (CLEOCIN) 300 MG capsule clindamycin HCl 300 mg capsule  TAKE 2 CAPSULES BY MOUTH 1 HOUR PRIOR TO DENTAL APPOINTMENT   . hydrocortisone (ANUSOL-HC) 2.5 % rectal cream APPLY EXTERNALLY TO THE AFFECTED AREA TWICE DAILY (Patient not taking: Reported on 09/17/2019)   . loratadine (CLARITIN) 10 MG tablet Take 10 mg by mouth daily as needed for allergies.  (Patient not taking: Reported on 09/17/2019)   . VALIUM 2 MG tablet TAKE 1 TABLET BY MOUTH EVERY 8 HOURS FOR MUSCLE SPASMS (Patient not taking: Reported on 04/02/2019) 04/02/2019: Only as need that not that often   No facility-administered encounter medications on file as of 10/01/2019.   Allergies  Allergen Reactions  . Azithromycin Nausea Only  . Arimidex [Anastrozole] Rash  . Avelox [Moxifloxacin Hcl In Nacl] Nausea Only and Rash  . Bactrim Nausea Only and Rash  . Biaxin [Clarithromycin] Nausea Only and Rash  .  Macrobid [Nitrofurantoin] Nausea Only and Rash  . Penicillins Nausea Only and Rash    Has patient had a PCN reaction causing immediate rash, facial/tongue/throat swelling, SOB or lightheadedness with hypotension: no Has patient had a PCN reaction causing severe rash involving mucus membranes or skin necrosis: no Has patient had a PCN reaction that required hospitalization no Has patient had a PCN reaction occurring within the last 10 years no If all of the above answers are "NO", then may proceed with Cephalosporin use.   . Sulfa Antibiotics Nausea And Vomiting and Rash    ROS:  No headaches, dizziness, URI symptoms, chest pain, shortness of breath, nausea, vomiting, bowel changes. No urinary complaints. No current knee pain. Feels overwhelmed, sometimes lonely. Decreased appetite, no weight changes See HPI   PHYSICAL EXAM:  BP (!) 162/98   Pulse 91   Temp 99 F (37.2 C)   Wt 113 lb (51.3 kg)   LMP  (LMP Unknown)   SpO2 96%   BMI 20.02 kg/m   168/92  on repeat by MD  Wt Readings from Last 3 Encounters:  10/01/19 113 lb (51.3 kg)  09/17/19 113 lb 6.4 oz (51.4 kg)  08/04/19 113 lb 4.8 oz (51.4 kg)    BP Readings from Last 3 Encounters:  09/17/19 (!) 160/100  08/04/19 (!) 187/97  04/02/19 126/80   Well developed, pleasant elderly female in no distress She is alert, oriented.  She has hygiene and grooming, normal eye contact and speech. Some thinning of the hair, but no focal areas of alopecia noted. She appears frustrated and a little depressed.  She still asks about my children, but affect is flatter today, doesn't seem like her usual self. HEENT: conjunctiva and sclera are clear, EOMI.  Nose and mouth not examined due to wearing mask. Neck: no lymphadenopathy, thyromegaly or carotid bruit Heart: regular rate and rhythm, no murmur, rub, gallop or ectopy Lungs: clear bilaterally Back: no spinal or CVA tenderness Abdomen: soft, nontender, no masses.  Catheter taped in place. Extremities: no edema, 2+ pulses Chest wall--declined exam, reports it felt fine now.  PHQ-9 score 3 (several days feeling down/depressed, having little energy and decreased appetite).   ASSESSMENT/PLAN:  Essential hypertension, benign - Elevated today, but worried about her grandson, and stressed/overwhelmed. 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 If Dig level normal, continue - Plan: Digoxin level, digoxin (LANOXIN) 0.125 MG tablet  History of breast cancer  Osteoporosis without current pathological fracture, unspecified osteoporosis type - cont Prolia injections, Ca, D. Weight bearing exercise encouraged  Medication monitoring encounter - Plan: Digoxin level  High risk medication use - Plan:  Digoxin level  Depression, major, single episode, mild (HCC) - related to family wanting her to move, pt feeling overwhelmed with idea of moving, and unsure if she wants to. Counseling encouraged.    Got permission to reach out to [facility] or the rabbi at [place] to help with resources (perhaps folks who can help her donate her items, transport, I offered to help as well; she prefers clothing to go to [employer]). She was resistant to counseling, so I tried to provide some at today's visit. Told her to start slowly--one drawer/closet at a time, now, rather than waiting  until moving.  Tackle it little by little and won't be overwhelming. She is very nervous about moving to [place]--discussed doing a trial visit for a month (not to sell her house, but see if they have availability at the places she is looking at to stay for a few weeks/month--places here do that). Explaining how talking to someone that is nonbiased regarding whether she should stay/move can be helpful in helping her make a decision, listing pros and cons, etc.  Digoxin level due today Digoxin refill needed/requested by pt  To see if [name] can show her how to use her BP monitor, and if not, set up a nurse visit to be shown how, and also to verify the accuracy of the monitor. If not doing NV here, she should bring her monitor, and list of BP's, to a visit in 1-2 weeks to review her BP log. Low Na diet encouraged, and daily exercise also  Recommended.  F/u as scheduled in 03/2020 for AWV--come fasting (10:15 appt)  >45 min FTF visit, more than 1/2 spent counseling, plus additional time in chart review and documentation.

## 2019-10-01 ENCOUNTER — Ambulatory Visit (INDEPENDENT_AMBULATORY_CARE_PROVIDER_SITE_OTHER): Payer: Medicare Other | Admitting: Family Medicine

## 2019-10-01 ENCOUNTER — Other Ambulatory Visit: Payer: Self-pay

## 2019-10-01 ENCOUNTER — Encounter: Payer: Self-pay | Admitting: Family Medicine

## 2019-10-01 VITALS — BP 162/98 | HR 91 | Temp 99.0°F | Wt 113.0 lb

## 2019-10-01 DIAGNOSIS — E782 Mixed hyperlipidemia: Secondary | ICD-10-CM

## 2019-10-01 DIAGNOSIS — Z5181 Encounter for therapeutic drug level monitoring: Secondary | ICD-10-CM

## 2019-10-01 DIAGNOSIS — Z9359 Other cystostomy status: Secondary | ICD-10-CM

## 2019-10-01 DIAGNOSIS — Z79899 Other long term (current) drug therapy: Secondary | ICD-10-CM | POA: Diagnosis not present

## 2019-10-01 DIAGNOSIS — F320 Major depressive disorder, single episode, mild: Secondary | ICD-10-CM | POA: Diagnosis not present

## 2019-10-01 DIAGNOSIS — M810 Age-related osteoporosis without current pathological fracture: Secondary | ICD-10-CM | POA: Diagnosis not present

## 2019-10-01 DIAGNOSIS — I10 Essential (primary) hypertension: Secondary | ICD-10-CM | POA: Diagnosis not present

## 2019-10-01 DIAGNOSIS — R002 Palpitations: Secondary | ICD-10-CM

## 2019-10-01 DIAGNOSIS — Z853 Personal history of malignant neoplasm of breast: Secondary | ICD-10-CM | POA: Diagnosis not present

## 2019-10-01 DIAGNOSIS — N312 Flaccid neuropathic bladder, not elsewhere classified: Secondary | ICD-10-CM

## 2019-10-01 MED ORDER — DIGOXIN 125 MCG PO TABS: ORAL_TABLET | ORAL | 1 refills | Status: DC

## 2019-10-01 NOTE — Patient Instructions (Addendum)
Please use your blood pressure monitor to check your blood pressure a few times each week. You can check it daily if you want. Use the paper given to record your readings, including the pulse (all 3 numbers that your monitor gives you). You can bring your monitor here for a nurse visit to have the nurses help you with it, if [name] isn't able to show you how to use it. BRING YOUR MONITOR AND LIST OF BLOOD PRESSURES TO YOUR VISIT in 2-3 weeks.  Limit the salt in your diet, and try and get some daily exercise (even if just walking inside), all of which can help keep the blood pressure down.  You are struggling with some important decisions regarding our future (to move or not), contributing to stress and some depression.  I highly recommend counseling to help you work through some of these difficult decisions and your moods. I encourage you to reach out to [facility] or elsewhere.  I recommend starting to go through some of your closets and things at the house, to slowly try and get rid of things you no longer need. This will make it seem like less of a burden when you need to move, and also get rid of unwanted things.  Consider a trial run, of staying in [place] for a month, before selling your house and packing everything up.  Weight-bearing exercise is recommended.  Consider using a 2 pound weight, or a heavy can or water bottle to use while walking around the house and for arm exercises a few times/week.   DASH Eating Plan DASH stands for "Dietary Approaches to Stop Hypertension." The DASH eating plan is a healthy eating plan that has been shown to reduce high blood pressure (hypertension). It may also reduce your risk for type 2 diabetes, heart disease, and stroke. The DASH eating plan may also help with weight loss. What are tips for following this plan?  General guidelines  Avoid eating more than 2,300 mg (milligrams) of salt (sodium) a day. If you have hypertension, you may need  to reduce your sodium intake to 1,500 mg a day.  Limit alcohol intake to no more than 1 drink a day for nonpregnant women and 2 drinks a day for men. One drink equals 12 oz of beer, 5 oz of wine, or 1 oz of hard liquor.  Work with your health care provider to maintain a healthy body weight or to lose weight. Ask what an ideal weight is for you.  Get at least 30 minutes of exercise that causes your heart to beat faster (aerobic exercise) most days of the week. Activities may include walking, swimming, or biking.  Work with your health care provider or diet and nutrition specialist (dietitian) to adjust your eating plan to your individual calorie needs. Reading food labels   Check food labels for the amount of sodium per serving. Choose foods with less than 5 percent of the Daily Value of sodium. Generally, foods with less than 300 mg of sodium per serving fit into this eating plan.  To find whole grains, look for the word "whole" as the first word in the ingredient list. Shopping  Buy products labeled as "low-sodium" or "no salt added."  Buy fresh foods. Avoid canned foods and premade or frozen meals. Cooking  Avoid adding salt when cooking. Use salt-free seasonings or herbs instead of table salt or sea salt. Check with your health care provider or pharmacist before using salt substitutes.  Do not  fry foods. Cook foods using healthy methods such as baking, boiling, grilling, and broiling instead.  Cook with heart-healthy oils, such as olive, canola, soybean, or sunflower oil. Meal planning  Eat a balanced diet that includes: ? 5 or more servings of fruits and vegetables each day. At each meal, try to fill half of your plate with fruits and vegetables. ? Up to 6-8 servings of whole grains each day. ? Less than 6 oz of lean meat, poultry, or fish each day. A 3-oz serving of meat is about the same size as a deck of cards. One egg equals 1 oz. ? 2 servings of low-fat dairy each day. ? A  serving of nuts, seeds, or beans 5 times each week. ? Heart-healthy fats. Healthy fats called Omega-3 fatty acids are found in foods such as flaxseeds and coldwater fish, like sardines, salmon, and mackerel.  Limit how much you eat of the following: ? Canned or prepackaged foods. ? Food that is high in trans fat, such as fried foods. ? Food that is high in saturated fat, such as fatty meat. ? Sweets, desserts, sugary drinks, and other foods with added sugar. ? Full-fat dairy products.  Do not salt foods before eating.  Try to eat at least 2 vegetarian meals each week.  Eat more home-cooked food and less restaurant, buffet, and fast food.  When eating at a restaurant, ask that your food be prepared with less salt or no salt, if possible. What foods are recommended? The items listed may not be a complete list. Talk with your dietitian about what dietary choices are best for you. Grains Whole-grain or whole-wheat bread. Whole-grain or whole-wheat pasta. Brown rice. [place]row. Bulgur. Whole-grain and low-sodium cereals. Pita bread. Low-fat, low-sodium crackers. Whole-wheat flour tortillas. Vegetables Fresh or frozen vegetables (raw, steamed, roasted, or grilled). Low-sodium or reduced-sodium tomato and vegetable juice. Low-sodium or reduced-sodium tomato sauce and tomato paste. Low-sodium or reduced-sodium canned vegetables. Fruits All fresh, dried, or frozen fruit. Canned fruit in natural juice (without added sugar). Meat and other protein foods Skinless chicken or [place]. Ground chicken or [place]. Pork with fat trimmed off. Fish and seafood. Egg whites. Dried beans, peas, or lentils. Unsalted nuts, nut butters, and seeds. Unsalted canned beans. Lean cuts of beef with fat trimmed off. Low-sodium, lean deli meat. Dairy Low-fat (1%) or fat-free (skim) milk. Fat-free, low-fat, or reduced-fat cheeses. Nonfat, low-sodium ricotta or cottage cheese. Low-fat or nonfat yogurt. Low-fat,  low-sodium cheese. Fats and oils Soft margarine without trans fats. Vegetable oil. Low-fat, reduced-fat, or light mayonnaise and salad dressings (reduced-sodium). Canola, safflower, olive, soybean, and sunflower oils. Avocado. Seasoning and other foods Herbs. Spices. Seasoning mixes without salt. Unsalted popcorn and pretzels. Fat-free sweets. What foods are not recommended? The items listed may not be a complete list. Talk with your dietitian about what dietary choices are best for you. Grains Baked goods made with fat, such as croissants, muffins, or some breads. Dry pasta or rice meal packs. Vegetables Creamed or fried vegetables. Vegetables in a cheese sauce. Regular canned vegetables (not low-sodium or reduced-sodium). Regular canned tomato sauce and paste (not low-sodium or reduced-sodium). Regular tomato and vegetable juice (not low-sodium or reduced-sodium). Angie Fava. Olives. Fruits Canned fruit in a light or heavy syrup. Fried fruit. Fruit in cream or butter sauce. Meat and other protein foods Fatty cuts of meat. Ribs. Fried meat. Berniece Salines. Sausage. Bologna and other processed lunch meats. Salami. Fatback. Hotdogs. Bratwurst. Salted nuts and seeds. Canned beans with added salt.  Canned or smoked fish. Whole eggs or egg yolks. Chicken or [place] with skin. Dairy Whole or 2% milk, cream, and half-and-half. Whole or full-fat cream cheese. Whole-fat or sweetened yogurt. Full-fat cheese. Nondairy creamers. Whipped toppings. Processed cheese and cheese spreads. Fats and oils Butter. Stick margarine. Lard. Shortening. Ghee. Bacon fat. Tropical oils, such as coconut, palm kernel, or palm oil. Seasoning and other foods Salted popcorn and pretzels. Onion salt, garlic salt, seasoned salt, table salt, and sea salt. Worcestershire sauce. Tartar sauce. Barbecue sauce. Teriyaki sauce. Soy sauce, including reduced-sodium. Steak sauce. Canned and packaged gravies. Fish sauce. Oyster sauce. Cocktail sauce.  Horseradish that you find on the shelf. Ketchup. Mustard. Meat flavorings and tenderizers. Bouillon cubes. Hot sauce and Tabasco sauce. Premade or packaged marinades. Premade or packaged taco seasonings. Relishes. Regular salad dressings. Where to find more information:  National Heart, Lung, and [place]: https://wilson-eaton.com/  American Heart Association: www.heart.org Summary  The DASH eating plan is a healthy eating plan that has been shown to reduce high blood pressure (hypertension). It may also reduce your risk for type 2 diabetes, heart disease, and stroke.  With the DASH eating plan, you should limit salt (sodium) intake to 2,300 mg a day. If you have hypertension, you may need to reduce your sodium intake to 1,500 mg a day.  When on the DASH eating plan, aim to eat more fresh fruits and vegetables, whole grains, lean proteins, low-fat dairy, and heart-healthy fats.  Work with your health care provider or diet and nutrition specialist (dietitian) to adjust your eating plan to your individual calorie needs. This information is not intended to replace advice given to you by your health care provider. Make sure you discuss any questions you have with your health care provider. Document Revised: 02/16/2017 Document Reviewed: 02/28/2016 Elsevier Patient Education  2020 [employer].

## 2019-10-02 LAB — DIGOXIN LEVEL: Digoxin, Serum: 1.2 ng/mL — ABNORMAL HIGH (ref 0.5–0.9)

## 2019-10-02 NOTE — Progress Notes (Signed)
Advise patient that her digoxin level was a little high.  In the past we continued this medication for her palpitations, as she has been on it many years.  But now, with the level being high, with her taking the lowest dose, it is not safe for her to continue it. I recommend that she skip it for 2 days, then take it every other day for a week, then stop it completely.  She should let us know if she has any increase in palpitations.  We will be seeing her in a few weeks, and can discuss it further then, but I do want her to stop the medication (qod x 1 week then stop)

## 2019-10-13 DIAGNOSIS — N312 Flaccid neuropathic bladder, not elsewhere classified: Secondary | ICD-10-CM | POA: Diagnosis not present

## 2019-10-14 NOTE — Progress Notes (Signed)
Chief Complaint  Patient presents with  . Hypertension    2 week blood pressure follow up.     Patient presents for 2 week follow-up on her blood pressure. She was asked to monitor it at home, and to bring her monitor and list of blood pressures.  It was elevated at her last visits. BP Readings from Last 3 Encounters:  10/01/19 (!) 162/98  09/17/19 (!) 160/100  08/04/19 (!) 187/97    Blood pressure at home is ranging from 134-170/83-101, mostly 150/90. Pulse 79-93. Her monitor was verified as accurate today.  She has been on calan and digoxin for many years for her palpitations.  Last Digoxin level was elevated (checked yearly, had always been normal previously). She was asked to taper off the digoxin (She was very anxious about this, worried about palpitations recurring when it was stopped).  She cut back to every other day starting 7/16, last dose was yesterday. She hasn't noted any increase in palpitations yet.  She is very worried about this--recalls having bouts of tachycardia requiring ER visits when she lived in [place].  She was called about refill of digoxin (had been sent to pharmacy at time of visit, prior to lab results), and she states she picked it up, but hasn't taken any.  Constipation--she has been taking Miralax when she can remember, ends up being every other day, a little more than 1/2 capful.  It is helping. Prune juice also helps.  New great grandchild born, her grandson recovered from surgery and was able to be with his wife.  Her daughter wants her to go to [place] now, but she states it is too hot now.  She reports she has tried doing exercises at home as I had suggested, she starts it, but she just doesn't do it.  [facility], and is asking about classes she could do.  At her last visit she also discussed feeling very overwhelmed at the idea of having to pack up her home and move--whether she stayed locally or moved to [place].  We  talked about starting very slowly (a closet or drawer at a time), and to reach out for help in getting things donated/removed. She hasn't thought about this again, put that on hold.  PMH, PSH, SH reviewed  Outpatient Encounter Medications as of 10/16/2019  Medication Sig Note  . atorvastatin (LIPITOR) 40 MG tablet TAKE 1 TABLET(40 MG) BY MOUTH EVERY EVENING   . Calcium Carbonate (CALTRATE 600 PO) Take 1 tablet by mouth daily.   . Cholecalciferol (VITAMIN D3) 2000 UNITS TABS Take 2,000 Units by mouth daily.   Marland Kitchen denosumab (PROLIA) 60 MG/ML SOLN injection Inject 60 mg into the skin See admin instructions. Administer in upper arm, thigh, or abdomen 12/13/2016: Every 6 months  . loratadine (CLARITIN) 10 MG tablet Take 10 mg by mouth daily as needed for allergies.    . Multiple Vitamins-Minerals (CENTRUM ADULTS PO) Take 1 tablet by mouth daily.   . Multiple Vitamins-Minerals (ICAPS AREDS 2) CAPS Take 1 capsule by mouth 2 (two) times daily.   . polyethylene glycol (MIRALAX / GLYCOLAX) packet Take 17 g by mouth daily as needed for moderate constipation.  12/13/2016: Uses it about every other day (forgets to take it daily)  . [DISCONTINUED] verapamil (CALAN-SR) 240 MG CR tablet TAKE 1 TABLET(240 MG) BY MOUTH DAILY   . clindamycin (CLEOCIN) 300 MG capsule clindamycin HCl 300 mg capsule  TAKE 2 CAPSULES BY MOUTH 1  HOUR PRIOR TO DENTAL APPOINTMENT (Patient not taking: Reported on 10/16/2019)   . hydrocortisone (ANUSOL-HC) 2.5 % rectal cream APPLY EXTERNALLY TO THE AFFECTED AREA TWICE DAILY (Patient not taking: Reported on 09/17/2019)   . VALIUM 2 MG tablet TAKE 1 TABLET BY MOUTH EVERY 8 HOURS FOR MUSCLE SPASMS (Patient not taking: Reported on 04/02/2019) 04/02/2019: Only as need that not that often  . verapamil (VERELAN PM) 360 MG 24 hr capsule Take 1 capsule (360 mg total) by mouth daily.   . [DISCONTINUED] digoxin (LANOXIN) 0.125 MG tablet TAKE 1 TABLET(125 MCG) BY MOUTH DAILY (Patient not taking: Reported on  10/16/2019)    No facility-administered encounter medications on file as of 10/16/2019.   Taking calan 240mg  prior to today's visit, and was taking lanoxin qod.  Allergies  Allergen Reactions  . Azithromycin Nausea Only  . Arimidex [Anastrozole] Rash  . Avelox [Moxifloxacin Hcl In Nacl] Nausea Only and Rash  . Bactrim Nausea Only and Rash  . Biaxin [Clarithromycin] Nausea Only and Rash  . Macrobid [Nitrofurantoin] Nausea Only and Rash  . Penicillins Nausea Only and Rash    Has patient had a PCN reaction causing immediate rash, facial/tongue/throat swelling, SOB or lightheadedness with hypotension: no Has patient had a PCN reaction causing severe rash involving mucus membranes or skin necrosis: no Has patient had a PCN reaction that required hospitalization no Has patient had a PCN reaction occurring within the last 10 years no If all of the above answers are "NO", then may proceed with Cephalosporin use.   . Sulfa Antibiotics Nausea And Vomiting and Rash   ROS: no fever, chills, URI symptoms, headaches, dizziness, chest pain, palpitations, edema.  Constipation has been managed with miralax and diet.  No bleeding, bruising, rash. Moods are stable.  See HPI   PHYSICAL EXAM:  BP (!) 150/90   Pulse 84   Ht 5\' 3"  (1.6 m)   Wt 112 lb 6.4 oz (51 kg)   LMP  (LMP Unknown)   BMI 19.91 kg/m   Pt monitor 148/88  Wt Readings from Last 3 Encounters:  10/16/19 112 lb 6.4 oz (51 kg)  10/01/19 113 lb (51.3 kg)  09/17/19 113 lb 6.4 oz (51.4 kg)   Pleasant, elderly female, in no distress. She has normal mood, appears somewhat anxious with concern about recurring tachycardia/palpitations, but less anxious/overwhelmed with respect to moving/traveling. She has normal speech, eye contact, full range of affect. HEENT: conjunctiva and sclera are clear, EOMI. Wearing mask Neck: no lymphadenopathy, thyromegaly or mass Heart: Regular rate and rhythm.  Ectopy (skipped and extra beats) noted, patient  asymptomatic. Lungs: clear bilaterally Back: no spinal or CVA tenderness Abdomen: soft, nontender, no mass Extremities: no edema. Skin: normal turgor, no rash   ASSESSMENT/PLAN:  Essential hypertension, benign - not at goal on 240mg  verapamil. Increase to 360mg . Reassured this can also help with her palpitations. f/u 4-6 wks, cont monitor BP/pulse at home - Plan: verapamil (VERELAN PM) 360 MG 24 hr capsule  Palpitations - stop digoxin due to elevated serum level. increasing verapamil should help. If recurrent issues, refer to cardiology for eval/tx - Plan: verapamil (VERELAN PM) 360 MG 24 hr capsule  35 min FTF visit, additional time spent in chart review and documentation. Significant counseling (more than 1/2 visit) regarding meds, SE, constipation, exercise counseling. Again encouraged baby steps regarding going through things in her home, expecting an eventual move.  Encouraged her to contact Kelton Pillar, congregational nurse, and [facility].  Change your Verapamil to the 360mg --a new prescription was sent to your pharmacy. STOP taking the 240mg  dose.  This should help lower your blood pressure, and also help with palpitations, so hopefully they won't become a problem, since you are not to take the digoxin any more.  Continue to check your blood pressure and pulse, and bring this list to your next visit (you don't need to bring the monitor again). It is fine to check it every other day--check it more often if you're feeling badly (dizzy, heart racing, headaches, or any other problems).  If your constipation worsens, you can increase the miralax dose to the full capsule.  Cut back on the dose if your bowels are either too loose or too frequent. Continue to drink plenty of water, prune juice, and get a high fiber diet.  [employer] programs are what you are interested in looking for as far as an exercise program for seniors. You can check with your insurance to  see if this is covered as part of your insurance. This is offered at many places, including the [place], [employer], many of the rec centers in [place]. You can also check with [employer].  Reach out to Kelton Pillar, the congregational nurse, to see if she can be helpful with regard to assistance with decided about moves, feeling overwhelmed, etc.

## 2019-10-16 ENCOUNTER — Other Ambulatory Visit: Payer: Self-pay | Admitting: Family Medicine

## 2019-10-16 ENCOUNTER — Other Ambulatory Visit: Payer: Self-pay

## 2019-10-16 ENCOUNTER — Encounter: Payer: Self-pay | Admitting: Family Medicine

## 2019-10-16 ENCOUNTER — Ambulatory Visit (INDEPENDENT_AMBULATORY_CARE_PROVIDER_SITE_OTHER): Payer: Medicare Other | Admitting: Family Medicine

## 2019-10-16 VITALS — BP 150/90 | HR 84 | Ht 63.0 in | Wt 112.4 lb

## 2019-10-16 DIAGNOSIS — I10 Essential (primary) hypertension: Secondary | ICD-10-CM | POA: Diagnosis not present

## 2019-10-16 DIAGNOSIS — R002 Palpitations: Secondary | ICD-10-CM

## 2019-10-16 MED ORDER — VERAPAMIL HCL ER 360 MG PO CP24: 360.0000 mg | ORAL_CAPSULE | Freq: Every day | ORAL | 1 refills | Status: DC

## 2019-10-16 NOTE — Patient Instructions (Addendum)
Change your Verapamil to the 360mg --a new prescription was sent to your pharmacy. STOP taking the 240mg  dose.  This should help lower your blood pressure, and also help with palpitations, so hopefully they won't become a problem, since you are not to take the digoxin any more.  Continue to check your blood pressure and pulse, and bring this list to your next visit (you don't need to bring the monitor again). It is fine to check it every other day--check it more often if you're feeling badly (dizzy, heart racing, headaches, or any other problems).  If your constipation worsens, you can increase the miralax dose to the full capful.  Cut back on the dose if your bowels are either too loose or too frequent. Continue to drink plenty of water, prune juice, and get a high fiber diet.  [employer] programs are what you are interested in looking for as far as an exercise program for seniors. You can check with your insurance to see if this is covered as part of your insurance. This is offered at many places, including the [facility], [employer], many of the rec centers in [place]. You can also check with [employer].  Reach out to Kelton Pillar, the congregational nurse, to see if she can be helpful with regard to assistance with decided about moves, feeling overwhelmed, etc.

## 2019-11-08 DIAGNOSIS — H6691 Otitis media, unspecified, right ear: Secondary | ICD-10-CM | POA: Diagnosis not present

## 2019-11-08 DIAGNOSIS — I10 Essential (primary) hypertension: Secondary | ICD-10-CM | POA: Diagnosis not present

## 2019-11-08 DIAGNOSIS — R519 Headache, unspecified: Secondary | ICD-10-CM | POA: Diagnosis not present

## 2019-11-10 DIAGNOSIS — N312 Flaccid neuropathic bladder, not elsewhere classified: Secondary | ICD-10-CM | POA: Diagnosis not present

## 2019-11-14 DIAGNOSIS — M26629 Arthralgia of temporomandibular joint, unspecified side: Secondary | ICD-10-CM | POA: Diagnosis not present
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
## 2019-11-17 ENCOUNTER — Encounter: Payer: Self-pay | Admitting: Family Medicine

## 2019-11-17 ENCOUNTER — Ambulatory Visit (INDEPENDENT_AMBULATORY_CARE_PROVIDER_SITE_OTHER): Payer: Medicare Other | Admitting: Family Medicine

## 2019-11-17 ENCOUNTER — Other Ambulatory Visit: Payer: Self-pay

## 2019-11-17 VITALS — BP 160/86 | HR 80 | Ht 63.0 in | Wt 114.8 lb

## 2019-11-17 DIAGNOSIS — R002 Palpitations: Secondary | ICD-10-CM

## 2019-11-17 DIAGNOSIS — I10 Essential (primary) hypertension: Secondary | ICD-10-CM | POA: Diagnosis not present

## 2019-11-17 MED ORDER — VERAPAMIL HCL ER 360 MG PO CP24: 360.0000 mg | ORAL_CAPSULE | Freq: Every day | ORAL | 1 refills | Status: DC

## 2019-11-27 ENCOUNTER — Telehealth: Payer: Self-pay | Admitting: *Deleted

## 2019-11-27 NOTE — Telephone Encounter (Signed)
Let me think this through--if his symptoms started Monday, when did the family last have contact with him, and are they symptomatic? (I'm assuming he didn't come). Wondering why they feel that Tamaiya needs to be tested (who hasn't had any direct contact with him), rather than the family being tested.  They would have to get it and then pass it on to [place] (can take 3-5 days for symptoms to develop after exposure).  See if you can make sense of this for me, so we can figure out what best to do for them. If she hasn't had direct contact with a "potential" sick person, and the actual contacts are asymptomatic, then it seems to me that [place] should only be tested if the family tests positive (her actual contacts).

## 2019-11-27 NOTE — Telephone Encounter (Signed)
Spoke with daughter and patient WAS around the sick contact so they are having her tested this Sat at [facility], Dr.Knapp to go ahead and proceed.

## 2019-11-27 NOTE — Telephone Encounter (Signed)
Josie Saunders, daughter called she is in town for [employer], as you know and leaving today in a few hours. Her husband in [place] may have [place], inconclusive results as of right now. Her and her daughters are asymptomatic right now-his symptoms started Mon night. Trish feels that Ms. Ewalt should be tested and should have the PCR test-do you agree and if so, can you direct them where to go and what to do.

## 2019-11-29 DIAGNOSIS — Z20822 Contact with and (suspected) exposure to covid-19: Secondary | ICD-10-CM | POA: Diagnosis not present

## 2019-12-02 ENCOUNTER — Telehealth: Payer: Self-pay | Admitting: Family Medicine

## 2019-12-02 NOTE — Telephone Encounter (Signed)
Pt wants you to call her for some questions and to discuss her negative Covid test she took at home or pharmacy. [phone]

## 2019-12-02 NOTE — Telephone Encounter (Signed)
Patient wanted to let you know that her COVID test was negative. She also wanted to check with you and know your thoughts on HD Flu and COVID booster. She is going to [facility] on 01/07/20 for possibly 2 weeks. When would you recommend she have these?

## 2019-12-02 NOTE — Telephone Encounter (Signed)
I recommend that she get the [place] booster and HD flu shot prior to going to [place] (can do a little early, not due until the end of October, can due after 10/1). She can get these at the same time, or she can come get the flu shot now (no need to wait), and get the booster 2 weeks later.

## 2019-12-02 NOTE — Telephone Encounter (Signed)
Patient scheduled for this Friday for flu shot, will call back for booster.

## 2019-12-05 ENCOUNTER — Other Ambulatory Visit: Payer: Self-pay

## 2019-12-06 DIAGNOSIS — Z23 Encounter for immunization: Secondary | ICD-10-CM | POA: Diagnosis not present

## 2019-12-09 DIAGNOSIS — N312 Flaccid neuropathic bladder, not elsewhere classified: Secondary | ICD-10-CM | POA: Diagnosis not present

## 2019-12-14 ENCOUNTER — Other Ambulatory Visit: Payer: Self-pay | Admitting: Family Medicine

## 2019-12-14 DIAGNOSIS — E782 Mixed hyperlipidemia: Secondary | ICD-10-CM

## 2019-12-17 DIAGNOSIS — Z23 Encounter for immunization: Secondary | ICD-10-CM | POA: Diagnosis not present

## 2019-12-26 DIAGNOSIS — H43813 Vitreous degeneration, bilateral: Secondary | ICD-10-CM | POA: Diagnosis not present

## 2019-12-26 DIAGNOSIS — H3540 Unspecified peripheral retinal degeneration: Secondary | ICD-10-CM | POA: Diagnosis not present

## 2019-12-26 DIAGNOSIS — H15833 Staphyloma posticum, bilateral: Secondary | ICD-10-CM | POA: Diagnosis not present

## 2019-12-26 DIAGNOSIS — H442E3 Degenerative myopia with other maculopathy, bilateral eye: Secondary | ICD-10-CM | POA: Diagnosis not present

## 2019-12-29 ENCOUNTER — Telehealth: Payer: Self-pay | Admitting: *Deleted

## 2019-12-29 MED ORDER — VALIUM 2 MG PO TABS
ORAL_TABLET | ORAL | 0 refills | Status: DC
Start: 2019-12-29 — End: 2021-06-14

## 2019-12-29 NOTE — Telephone Encounter (Signed)
Patient advised.

## 2019-12-29 NOTE — Telephone Encounter (Signed)
Patient called and requested #30 Valium 2mg  as she is leaving for [facility] in a few days. She uses [facility] on [address].

## 2019-12-29 NOTE — Telephone Encounter (Signed)
done

## 2020-01-06 DIAGNOSIS — N312 Flaccid neuropathic bladder, not elsewhere classified: Secondary | ICD-10-CM | POA: Diagnosis not present

## 2020-01-20 DIAGNOSIS — N312 Flaccid neuropathic bladder, not elsewhere classified: Secondary | ICD-10-CM | POA: Diagnosis not present

## 2020-02-02 DIAGNOSIS — N312 Flaccid neuropathic bladder, not elsewhere classified: Secondary | ICD-10-CM | POA: Diagnosis not present

## 2020-02-04 ENCOUNTER — Inpatient Hospital Stay: Payer: Medicare Other | Attending: Oncology

## 2020-02-04 ENCOUNTER — Inpatient Hospital Stay: Payer: Medicare Other

## 2020-02-04 ENCOUNTER — Other Ambulatory Visit: Payer: Self-pay

## 2020-02-04 VITALS — BP 152/79 | HR 79 | Temp 97.9°F | Resp 20

## 2020-02-04 DIAGNOSIS — M818 Other osteoporosis without current pathological fracture: Secondary | ICD-10-CM

## 2020-02-04 DIAGNOSIS — M810 Age-related osteoporosis without current pathological fracture: Secondary | ICD-10-CM | POA: Diagnosis not present

## 2020-02-04 DIAGNOSIS — Z170 Estrogen receptor positive status [ER+]: Secondary | ICD-10-CM

## 2020-02-04 DIAGNOSIS — C50411 Malignant neoplasm of upper-outer quadrant of right female breast: Secondary | ICD-10-CM | POA: Insufficient documentation

## 2020-02-04 LAB — COMPREHENSIVE METABOLIC PANEL
ALT: 13 U/L (ref 0–44)
AST: 16 U/L (ref 15–41)
Albumin: 3.9 g/dL (ref 3.5–5.0)
Alkaline Phosphatase: 54 U/L (ref 38–126)
Anion gap: 8 (ref 5–15)
BUN: 19 mg/dL (ref 8–23)
CO2: 30 mmol/L (ref 22–32)
Calcium: 9.6 mg/dL (ref 8.9–10.3)
Chloride: 101 mmol/L (ref 98–111)
Creatinine, Ser: 0.95 mg/dL (ref 0.44–1.00)
GFR, Estimated: 57 mL/min — ABNORMAL LOW (ref >60)
Glucose, Bld: 111 mg/dL — ABNORMAL HIGH (ref 70–99)
Potassium: 4.5 mmol/L (ref 3.5–5.1)
Sodium: 139 mmol/L (ref 135–145)
Total Bilirubin: 0.5 mg/dL (ref 0.3–1.2)
Total Protein: 7.1 g/dL (ref 6.5–8.1)

## 2020-02-04 LAB — CBC WITH DIFFERENTIAL/PLATELET
Abs Immature Granulocytes: 0.01 10*3/uL (ref 0.00–0.07)
Basophils Absolute: 0.1 10*3/uL (ref 0.0–0.1)
Basophils Relative: 1 %
Eosinophils Absolute: 0.3 10*3/uL (ref 0.0–0.5)
Eosinophils Relative: 4 %
HCT: 38.9 % (ref 36.0–46.0)
Hemoglobin: 12.4 g/dL (ref 12.0–15.0)
Immature Granulocytes: 0 %
Lymphocytes Relative: 24 %
Lymphs Abs: 1.4 10*3/uL (ref 0.7–4.0)
MCH: 28.5 pg (ref 26.0–34.0)
MCHC: 31.9 g/dL (ref 30.0–36.0)
MCV: 89.4 fL (ref 80.0–100.0)
Monocytes Absolute: 0.5 10*3/uL (ref 0.1–1.0)
Monocytes Relative: 9 %
Neutro Abs: 3.6 10*3/uL (ref 1.7–7.7)
Neutrophils Relative %: 62 %
Platelets: 217 10*3/uL (ref 150–400)
RBC: 4.35 MIL/uL (ref 3.87–5.11)
RDW: 15 % (ref 11.5–15.5)
WBC: 5.9 10*3/uL (ref 4.0–10.5)
nRBC: 0 % (ref 0.0–0.2)

## 2020-02-04 LAB — VITAMIN D 25 HYDROXY (VIT D DEFICIENCY, FRACTURES): Vit D, 25-Hydroxy: 48.56 ng/mL (ref 30–100)

## 2020-02-04 MED ORDER — DENOSUMAB 60 MG/ML ~~LOC~~ SOSY
PREFILLED_SYRINGE | SUBCUTANEOUS | Status: AC
  Filled 2020-02-04: qty 1

## 2020-02-04 MED ORDER — DENOSUMAB 60 MG/ML ~~LOC~~ SOSY
60.0000 mg | PREFILLED_SYRINGE | Freq: Once | SUBCUTANEOUS | Status: AC
  Administered 2020-02-04: 60 mg via SUBCUTANEOUS

## 2020-02-04 NOTE — Patient Instructions (Signed)
Denosumab injection What is this medicine? DENOSUMAB (den oh sue mab) slows bone breakdown. Prolia is used to treat osteoporosis in women after menopause and in men, and in people who are taking corticosteroids for 6 months or more. Xgeva is used to treat a high calcium level due to cancer and to prevent bone fractures and other bone problems caused by multiple myeloma or cancer bone metastases. Xgeva is also used to treat giant cell tumor of the bone. This medicine may be used for other purposes; ask your health care provider or pharmacist if you have questions. COMMON BRAND NAME(S): Prolia, XGEVA What should I tell my health care provider before I take this medicine? They need to know if you have any of these conditions:  dental disease  having surgery or tooth extraction  infection  kidney disease  low levels of calcium or Vitamin D in the blood  malnutrition  on hemodialysis  skin conditions or sensitivity  thyroid or parathyroid disease  an unusual reaction to denosumab, other medicines, foods, dyes, or preservatives  pregnant or trying to get pregnant  breast-feeding How should I use this medicine? This medicine is for injection under the skin. It is given by a health care professional in a hospital or clinic setting. A special MedGuide will be given to you before each treatment. Be sure to read this information carefully each time. For Prolia, talk to your pediatrician regarding the use of this medicine in children. Special care may be needed. For Xgeva, talk to your pediatrician regarding the use of this medicine in children. While this drug may be prescribed for children as Ellie Spickler as 13 years for selected conditions, precautions do apply. Overdosage: If you think you have taken too much of this medicine contact a poison control center or emergency room at once. NOTE: This medicine is only for you. Do not share this medicine with others. What if I miss a dose? It is  important not to miss your dose. Call your doctor or health care professional if you are unable to keep an appointment. What may interact with this medicine? Do not take this medicine with any of the following medications:  other medicines containing denosumab This medicine may also interact with the following medications:  medicines that lower your chance of fighting infection  steroid medicines like prednisone or cortisone This list may not describe all possible interactions. Give your health care provider a list of all the medicines, herbs, non-prescription drugs, or dietary supplements you use. Also tell them if you smoke, drink alcohol, or use illegal drugs. Some items may interact with your medicine. What should I watch for while using this medicine? Visit your doctor or health care professional for regular checks on your progress. Your doctor or health care professional may order blood tests and other tests to see how you are doing. Call your doctor or health care professional for advice if you get a fever, chills or sore throat, or other symptoms of a cold or flu. Do not treat yourself. This drug may decrease your body's ability to fight infection. Try to avoid being around people who are sick. You should make sure you get enough calcium and vitamin D while you are taking this medicine, unless your doctor tells you not to. Discuss the foods you eat and the vitamins you take with your health care professional. See your dentist regularly. Brush and floss your teeth as directed. Before you have any dental work done, tell your dentist you are   receiving this medicine. Do not become pregnant while taking this medicine or for 5 months after stopping it. Talk with your doctor or health care professional about your birth control options while taking this medicine. Women should inform their doctor if they wish to become pregnant or think they might be pregnant. There is a potential for serious side  effects to an unborn child. Talk to your health care professional or pharmacist for more information. What side effects may I notice from receiving this medicine? Side effects that you should report to your doctor or health care professional as soon as possible:  allergic reactions like skin rash, itching or hives, swelling of the face, lips, or tongue  bone pain  breathing problems  dizziness  jaw pain, especially after dental work  redness, blistering, peeling of the skin  signs and symptoms of infection like fever or chills; cough; sore throat; pain or trouble passing urine  signs of low calcium like fast heartbeat, muscle cramps or muscle pain; pain, tingling, numbness in the hands or feet; seizures  unusual bleeding or bruising  unusually weak or tired Side effects that usually do not require medical attention (report to your doctor or health care professional if they continue or are bothersome):  constipation  diarrhea  headache  joint pain  loss of appetite  muscle pain  runny nose  tiredness  upset stomach This list may not describe all possible side effects. Call your doctor for medical advice about side effects. You may report side effects to FDA at 1-800-FDA-1088. Where should I keep my medicine? This medicine is only given in a clinic, doctor's office, or other health care setting and will not be stored at home. NOTE: This sheet is a summary. It may not cover all possible information. If you have questions about this medicine, talk to your doctor, pharmacist, or health care provider.  2020 Elsevier/Gold Standard (2017-07-13 16:10:44)

## 2020-02-17 DIAGNOSIS — H532 Diplopia: Secondary | ICD-10-CM | POA: Diagnosis not present

## 2020-03-01 DIAGNOSIS — N312 Flaccid neuropathic bladder, not elsewhere classified: Secondary | ICD-10-CM | POA: Diagnosis not present

## 2020-03-08 ENCOUNTER — Other Ambulatory Visit: Payer: Self-pay | Admitting: Family Medicine

## 2020-03-08 DIAGNOSIS — E782 Mixed hyperlipidemia: Secondary | ICD-10-CM

## 2020-03-09 NOTE — Telephone Encounter (Signed)
Pt has appt coming up in january

## 2020-03-18 ENCOUNTER — Telehealth: Payer: Self-pay | Admitting: Family Medicine

## 2020-03-18 NOTE — Telephone Encounter (Signed)
There is no "preventative", and there isn't always a fever with COVID. I recommend virtual.  If they just want to do supportive measures at home and not be seen, then they can just treat the symptoms (ie robitussin DM for cough, tylenol for any pain/headache, etc)

## 2020-03-18 NOTE — Telephone Encounter (Signed)
Lupita Leash notified of Dr. Delford Field recommendations.

## 2020-03-18 NOTE — Telephone Encounter (Signed)
Pt's niece, Spero Curb called. She states that pt's Granddaughter is staying with pt and is sick. She tested negative for at home COVID test. She says that now Maitri is not feeling well. I offered a Virtual appt and she declined. Stating pt is not running a fever and she really just wants to know if pt can be started on a preventative so she doesn't get sick and what should they do if she does start running a fever. She is on pt's HIPAA. Lupita Leash can be reached at [phone].

## 2020-04-01 DIAGNOSIS — N312 Flaccid neuropathic bladder, not elsewhere classified: Secondary | ICD-10-CM | POA: Diagnosis not present
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
## 2020-04-07 NOTE — Patient Instructions (Addendum)
  HEALTH MAINTENANCE RECOMMENDATIONS:  It is recommended that you get at least 30 minutes of aerobic exercise at least 5 days/week (for weight loss, you may need as much as 60-90 minutes). This can be any activity that gets your heart rate up. This can be divided in 10-15 minute intervals if needed, but try and build up your endurance at least once a week.  Weight bearing exercise is also recommended twice weekly.  Eat a healthy diet with lots of vegetables, fruits and fiber.  "Colorful" foods have a lot of vitamins (ie green vegetables, tomatoes, red peppers, etc).  Limit sweet tea, regular sodas and alcoholic beverages, all of which has a lot of calories and sugar.  Up to 1 alcoholic drink daily may be beneficial for women (unless trying to lose weight, watch sugars).  Drink a lot of water.  Calcium recommendations are 1200-1500 mg daily (1500 mg for postmenopausal women or women without ovaries), and vitamin D 1000 IU daily.  This should be obtained from diet and/or supplements (vitamins), and calcium should not be taken all at once, but in divided doses.  Monthly self breast exams and yearly mammograms for women over the age of 50 is recommended.  Sunscreen of at least SPF 30 should be used on all sun-exposed parts of the skin when outside between the hours of 10 am and 4 pm (not just when at beach or pool, but even with exercise, golf, tennis, and yard work!)  Use a sunscreen that says "broad spectrum" so it covers both UVA and UVB rays, and make sure to reapply every 1-2 hours.  Remember to change the batteries in your smoke detectors when changing your clock times in the spring and fall. Carbon monoxide detectors are recommended for your home.  Use your seat belt every time you are in a car, and please drive safely and not be distracted with cell phones and texting while driving.   Ms. Rahrig , Thank you for taking time to come for your Medicare Wellness Visit. I appreciate your ongoing  commitment to your health goals. Please review the following plan we discussed and let me know if I can assist you in the future.   This is a list of the screening recommended for you and due dates:  Health Maintenance  Topic Date Due  . COVID-19 Vaccine (3 - Pfizer risk 4-dose series) 06/12/2019  . Tetanus Vaccine  09/03/2021  . Flu Shot  Completed  . DEXA scan (bone density measurement)  Completed  . Pneumonia vaccines  Completed   You are due for your mammogram. Please call and schedule this.  Be sure to get weight-bearing exercise at least 2 times/week.  I recommend getting the new shingles vaccine (Shingrix). Since you have Medicare, you will need to get this from the pharmacy, as it is covered by Part D..   This is a series of 2 injections, spaced 2 months apart.  Go back to taking Miralax regularly.  Take it once daily. You may cut back the dose if/when you start moving your bowels too often or if they are too loose (some people may only need it 2-3 times/week, or just 1/2 capful every day instead of a full one.  Some people truly need the full dose every day). It is up to you if you want to continue metamucil or not.  I recommend getting 1-3# pounds, and holding them while walking.  You can do this while watching TV.

## 2020-04-08 ENCOUNTER — Other Ambulatory Visit: Payer: Self-pay

## 2020-04-08 ENCOUNTER — Encounter: Payer: Self-pay | Admitting: Family Medicine

## 2020-04-08 ENCOUNTER — Ambulatory Visit (INDEPENDENT_AMBULATORY_CARE_PROVIDER_SITE_OTHER): Payer: Medicare Other | Admitting: Family Medicine

## 2020-04-08 VITALS — BP 134/86 | HR 80 | Ht 63.0 in | Wt 115.0 lb

## 2020-04-08 DIAGNOSIS — Z9359 Other cystostomy status: Secondary | ICD-10-CM | POA: Diagnosis not present

## 2020-04-08 DIAGNOSIS — R002 Palpitations: Secondary | ICD-10-CM | POA: Diagnosis not present

## 2020-04-08 DIAGNOSIS — E782 Mixed hyperlipidemia: Secondary | ICD-10-CM | POA: Diagnosis not present

## 2020-04-08 DIAGNOSIS — K5900 Constipation, unspecified: Secondary | ICD-10-CM | POA: Diagnosis not present

## 2020-04-08 DIAGNOSIS — Z131 Encounter for screening for diabetes mellitus: Secondary | ICD-10-CM | POA: Diagnosis not present

## 2020-04-08 DIAGNOSIS — I10 Essential (primary) hypertension: Secondary | ICD-10-CM

## 2020-04-08 DIAGNOSIS — N312 Flaccid neuropathic bladder, not elsewhere classified: Secondary | ICD-10-CM

## 2020-04-08 DIAGNOSIS — Z0000 Encounter for general adult medical examination without abnormal findings: Secondary | ICD-10-CM | POA: Diagnosis not present

## 2020-04-08 DIAGNOSIS — M810 Age-related osteoporosis without current pathological fracture: Secondary | ICD-10-CM | POA: Diagnosis not present

## 2020-04-08 DIAGNOSIS — Z5181 Encounter for therapeutic drug level monitoring: Secondary | ICD-10-CM | POA: Diagnosis not present

## 2020-04-08 DIAGNOSIS — Z853 Personal history of malignant neoplasm of breast: Secondary | ICD-10-CM

## 2020-04-09 ENCOUNTER — Encounter: Payer: Self-pay | Admitting: Family Medicine

## 2020-04-09 LAB — LIPID PANEL
Chol/HDL Ratio: 2 ratio (ref 0.0–4.4)
Cholesterol, Total: 185 mg/dL (ref 100–199)
HDL: 91 mg/dL (ref >39)
LDL Chol Calc (NIH): 74 mg/dL (ref 0–99)
Triglycerides: 114 mg/dL (ref 0–149)
VLDL Cholesterol Cal: 20 mg/dL (ref 5–40)

## 2020-04-09 LAB — GLUCOSE, RANDOM: Glucose: 90 mg/dL (ref 65–99)

## 2020-04-13 DIAGNOSIS — U071 COVID-19: Secondary | ICD-10-CM | POA: Diagnosis not present

## 2020-04-13 DIAGNOSIS — L309 Dermatitis, unspecified: Secondary | ICD-10-CM | POA: Diagnosis not present

## 2020-04-22 DIAGNOSIS — M5459 Other low back pain: Secondary | ICD-10-CM | POA: Diagnosis not present

## 2020-04-22 DIAGNOSIS — M25552 Pain in left hip: Secondary | ICD-10-CM | POA: Diagnosis not present

## 2020-04-27 DIAGNOSIS — N312 Flaccid neuropathic bladder, not elsewhere classified: Secondary | ICD-10-CM | POA: Diagnosis not present

## 2020-05-02 ENCOUNTER — Other Ambulatory Visit: Payer: Self-pay | Admitting: Family Medicine

## 2020-05-02 DIAGNOSIS — I10 Essential (primary) hypertension: Secondary | ICD-10-CM

## 2020-05-02 DIAGNOSIS — R002 Palpitations: Secondary | ICD-10-CM

## 2020-05-06 DIAGNOSIS — Z1231 Encounter for screening mammogram for malignant neoplasm of breast: Secondary | ICD-10-CM | POA: Diagnosis not present

## 2020-05-06 LAB — HM MAMMOGRAPHY

## 2020-05-13 ENCOUNTER — Encounter: Payer: Self-pay | Admitting: *Deleted

## 2020-05-13 ENCOUNTER — Encounter: Payer: Self-pay | Admitting: Family Medicine

## 2020-05-17 DIAGNOSIS — N312 Flaccid neuropathic bladder, not elsewhere classified: Secondary | ICD-10-CM | POA: Diagnosis not present

## 2020-05-17 DIAGNOSIS — N210 Calculus in bladder: Secondary | ICD-10-CM | POA: Diagnosis not present

## 2020-05-25 DIAGNOSIS — L501 Idiopathic urticaria: Secondary | ICD-10-CM | POA: Diagnosis not present

## 2020-06-03 DIAGNOSIS — H6123 Impacted cerumen, bilateral: Secondary | ICD-10-CM | POA: Diagnosis not present

## 2020-06-13 ENCOUNTER — Other Ambulatory Visit: Payer: Self-pay | Admitting: Family Medicine

## 2020-06-13 DIAGNOSIS — E782 Mixed hyperlipidemia: Secondary | ICD-10-CM

## 2020-06-15 DIAGNOSIS — N312 Flaccid neuropathic bladder, not elsewhere classified: Secondary | ICD-10-CM | POA: Diagnosis not present

## 2020-06-23 DIAGNOSIS — H442E3 Degenerative myopia with other maculopathy, bilateral eye: Secondary | ICD-10-CM | POA: Diagnosis not present

## 2020-06-23 DIAGNOSIS — H15833 Staphyloma posticum, bilateral: Secondary | ICD-10-CM | POA: Diagnosis not present

## 2020-06-23 DIAGNOSIS — H35371 Puckering of macula, right eye: Secondary | ICD-10-CM | POA: Diagnosis not present

## 2020-06-23 DIAGNOSIS — H3540 Unspecified peripheral retinal degeneration: Secondary | ICD-10-CM | POA: Diagnosis not present

## 2020-07-07 ENCOUNTER — Ambulatory Visit: Payer: Medicare Other

## 2020-07-09 DIAGNOSIS — M1712 Unilateral primary osteoarthritis, left knee: Secondary | ICD-10-CM | POA: Diagnosis not present

## 2020-07-12 DIAGNOSIS — Z23 Encounter for immunization: Secondary | ICD-10-CM | POA: Diagnosis not present

## 2020-07-13 DIAGNOSIS — N312 Flaccid neuropathic bladder, not elsewhere classified: Secondary | ICD-10-CM | POA: Diagnosis not present
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
## 2020-08-03 ENCOUNTER — Inpatient Hospital Stay (HOSPITAL_BASED_OUTPATIENT_CLINIC_OR_DEPARTMENT_OTHER): Payer: Medicare Other | Admitting: Oncology

## 2020-08-03 ENCOUNTER — Inpatient Hospital Stay: Payer: Medicare Other

## 2020-08-03 ENCOUNTER — Other Ambulatory Visit: Payer: Self-pay

## 2020-08-03 ENCOUNTER — Inpatient Hospital Stay: Payer: Medicare Other | Attending: Oncology

## 2020-08-03 VITALS — BP 161/70 | HR 66 | Temp 97.9°F | Resp 18 | Ht 63.0 in | Wt 115.9 lb

## 2020-08-03 DIAGNOSIS — M818 Other osteoporosis without current pathological fracture: Secondary | ICD-10-CM

## 2020-08-03 DIAGNOSIS — Z170 Estrogen receptor positive status [ER+]: Secondary | ICD-10-CM | POA: Diagnosis not present

## 2020-08-03 DIAGNOSIS — C50411 Malignant neoplasm of upper-outer quadrant of right female breast: Secondary | ICD-10-CM

## 2020-08-03 DIAGNOSIS — M810 Age-related osteoporosis without current pathological fracture: Secondary | ICD-10-CM | POA: Diagnosis not present

## 2020-08-03 DIAGNOSIS — Z853 Personal history of malignant neoplasm of breast: Secondary | ICD-10-CM | POA: Diagnosis not present

## 2020-08-03 LAB — CBC WITH DIFFERENTIAL/PLATELET
Abs Immature Granulocytes: 0.01 10*3/uL (ref 0.00–0.07)
Basophils Absolute: 0.1 10*3/uL (ref 0.0–0.1)
Basophils Relative: 1 %
Eosinophils Absolute: 0.3 10*3/uL (ref 0.0–0.5)
Eosinophils Relative: 5 %
HCT: 38.7 % (ref 36.0–46.0)
Hemoglobin: 12.3 g/dL (ref 12.0–15.0)
Immature Granulocytes: 0 %
Lymphocytes Relative: 23 %
Lymphs Abs: 1.3 10*3/uL (ref 0.7–4.0)
MCH: 28.7 pg (ref 26.0–34.0)
MCHC: 31.8 g/dL (ref 30.0–36.0)
MCV: 90.4 fL (ref 80.0–100.0)
Monocytes Absolute: 0.5 10*3/uL (ref 0.1–1.0)
Monocytes Relative: 9 %
Neutro Abs: 3.4 10*3/uL (ref 1.7–7.7)
Neutrophils Relative %: 62 %
Platelets: 214 10*3/uL (ref 150–400)
RBC: 4.28 MIL/uL (ref 3.87–5.11)
RDW: 14.6 % (ref 11.5–15.5)
WBC: 5.5 10*3/uL (ref 4.0–10.5)
nRBC: 0 % (ref 0.0–0.2)

## 2020-08-03 LAB — COMPREHENSIVE METABOLIC PANEL
ALT: 12 U/L (ref 0–44)
AST: 15 U/L (ref 15–41)
Albumin: 3.7 g/dL (ref 3.5–5.0)
Alkaline Phosphatase: 56 U/L (ref 38–126)
Anion gap: 7 (ref 5–15)
BUN: 20 mg/dL (ref 8–23)
CO2: 31 mmol/L (ref 22–32)
Calcium: 9.3 mg/dL (ref 8.9–10.3)
Chloride: 101 mmol/L (ref 98–111)
Creatinine, Ser: 0.77 mg/dL (ref 0.44–1.00)
GFR, Estimated: >60 mL/min (ref >60)
Glucose, Bld: 101 mg/dL — ABNORMAL HIGH (ref 70–99)
Potassium: 3.8 mmol/L (ref 3.5–5.1)
Sodium: 139 mmol/L (ref 135–145)
Total Bilirubin: 0.5 mg/dL (ref 0.3–1.2)
Total Protein: 6.9 g/dL (ref 6.5–8.1)

## 2020-08-03 MED ORDER — VITAMIN D3 50 MCG (2000 UT) PO TABS: 2000.0000 [IU] | ORAL_TABLET | Freq: Every day | ORAL | 4 refills | Status: DC

## 2020-08-03 MED ORDER — DENOSUMAB 60 MG/ML ~~LOC~~ SOSY
60.0000 mg | PREFILLED_SYRINGE | Freq: Once | SUBCUTANEOUS | Status: AC
  Administered 2020-08-03: 60 mg via SUBCUTANEOUS

## 2020-08-03 MED ORDER — DENOSUMAB 60 MG/ML ~~LOC~~ SOSY
PREFILLED_SYRINGE | SUBCUTANEOUS | Status: AC
  Filled 2020-08-03: qty 1

## 2020-08-03 NOTE — Patient Instructions (Signed)
Denosumab injection What is this medicine? DENOSUMAB (den oh sue mab) slows bone breakdown. Prolia is used to treat osteoporosis in women after menopause and in men, and in people who are taking corticosteroids for 6 months or more. Xgeva is used to treat a high calcium level due to cancer and to prevent bone fractures and other bone problems caused by multiple myeloma or cancer bone metastases. Xgeva is also used to treat giant cell tumor of the bone. This medicine may be used for other purposes; ask your health care provider or pharmacist if you have questions. COMMON BRAND NAME(S): Prolia, XGEVA What should I tell my health care provider before I take this medicine? They need to know if you have any of these conditions:  dental disease  having surgery or tooth extraction  infection  kidney disease  low levels of calcium or Vitamin D in the blood  malnutrition  on hemodialysis  skin conditions or sensitivity  thyroid or parathyroid disease  an unusual reaction to denosumab, other medicines, foods, dyes, or preservatives  pregnant or trying to get pregnant  breast-feeding How should I use this medicine? This medicine is for injection under the skin. It is given by a health care professional in a hospital or clinic setting. A special MedGuide will be given to you before each treatment. Be sure to read this information carefully each time. For Prolia, talk to your pediatrician regarding the use of this medicine in children. Special care may be needed. For Xgeva, talk to your pediatrician regarding the use of this medicine in children. While this drug may be prescribed for children as young as 13 years for selected conditions, precautions do apply. Overdosage: If you think you have taken too much of this medicine contact a poison control center or emergency room at once. NOTE: This medicine is only for you. Do not share this medicine with others. What if I miss a dose? It is  important not to miss your dose. Call your doctor or health care professional if you are unable to keep an appointment. What may interact with this medicine? Do not take this medicine with any of the following medications:  other medicines containing denosumab This medicine may also interact with the following medications:  medicines that lower your chance of fighting infection  steroid medicines like prednisone or cortisone This list may not describe all possible interactions. Give your health care provider a list of all the medicines, herbs, non-prescription drugs, or dietary supplements you use. Also tell them if you smoke, drink alcohol, or use illegal drugs. Some items may interact with your medicine. What should I watch for while using this medicine? Visit your doctor or health care professional for regular checks on your progress. Your doctor or health care professional may order blood tests and other tests to see how you are doing. Call your doctor or health care professional for advice if you get a fever, chills or sore throat, or other symptoms of a cold or flu. Do not treat yourself. This drug may decrease your body's ability to fight infection. Try to avoid being around people who are sick. You should make sure you get enough calcium and vitamin D while you are taking this medicine, unless your doctor tells you not to. Discuss the foods you eat and the vitamins you take with your health care professional. See your dentist regularly. Brush and floss your teeth as directed. Before you have any dental work done, tell your dentist you are   receiving this medicine. Do not become pregnant while taking this medicine or for 5 months after stopping it. Talk with your doctor or health care professional about your birth control options while taking this medicine. Women should inform their doctor if they wish to become pregnant or think they might be pregnant. There is a potential for serious side  effects to an unborn child. Talk to your health care professional or pharmacist for more information. What side effects may I notice from receiving this medicine? Side effects that you should report to your doctor or health care professional as soon as possible:  allergic reactions like skin rash, itching or hives, swelling of the face, lips, or tongue  bone pain  breathing problems  dizziness  jaw pain, especially after dental work  redness, blistering, peeling of the skin  signs and symptoms of infection like fever or chills; cough; sore throat; pain or trouble passing urine  signs of low calcium like fast heartbeat, muscle cramps or muscle pain; pain, tingling, numbness in the hands or feet; seizures  unusual bleeding or bruising  unusually weak or tired Side effects that usually do not require medical attention (report to your doctor or health care professional if they continue or are bothersome):  constipation  diarrhea  headache  joint pain  loss of appetite  muscle pain  runny nose  tiredness  upset stomach This list may not describe all possible side effects. Call your doctor for medical advice about side effects. You may report side effects to FDA at 1-800-FDA-1088. Where should I keep my medicine? This medicine is only given in a clinic, doctor's office, or other health care setting and will not be stored at home. NOTE: This sheet is a summary. It may not cover all possible information. If you have questions about this medicine, talk to your doctor, pharmacist, or health care provider.  2021 Elsevier/Gold Standard (2017-07-13 16:10:44)

## 2020-08-04 ENCOUNTER — Telehealth: Payer: Self-pay | Admitting: Family Medicine

## 2020-08-04 NOTE — Telephone Encounter (Signed)
Patient called stating that she had to stop vitamins a while back because she was having hives and dermatologist told her she was allergic to the vitamins.  Now  Dr. [name] wants her to start back on them And she wants to talk to you about it

## 2020-08-04 NOTE — Telephone Encounter (Signed)
Patient advised.

## 2020-08-04 NOTE — Telephone Encounter (Signed)
Advise patient that I reviewed Dr. Virgie Dad recommendations which were the following:  "Some of her medications were discontinued because of the rash in January.  I have asked her to start the vitamin D again now.  If the rash does not recur by June then she will start her calcium and if the rash does not recur by July she will return to her multivitamin"  I am comfortable with these recommendations.  He is trying to improve her bones, starting one thing at a time, to see which, if any, cause a problem.

## 2020-08-10 DIAGNOSIS — N312 Flaccid neuropathic bladder, not elsewhere classified: Secondary | ICD-10-CM | POA: Diagnosis not present

## 2020-08-13 ENCOUNTER — Other Ambulatory Visit: Payer: Self-pay | Admitting: Ophthalmology

## 2020-08-13 DIAGNOSIS — H532 Diplopia: Secondary | ICD-10-CM

## 2020-08-13 DIAGNOSIS — H26493 Other secondary cataract, bilateral: Secondary | ICD-10-CM | POA: Diagnosis not present

## 2020-08-13 DIAGNOSIS — H524 Presbyopia: Secondary | ICD-10-CM | POA: Diagnosis not present

## 2020-08-13 DIAGNOSIS — H04123 Dry eye syndrome of bilateral lacrimal glands: Secondary | ICD-10-CM | POA: Diagnosis not present

## 2020-08-24 ENCOUNTER — Ambulatory Visit
Admission: RE | Admit: 2020-08-24 | Discharge: 2020-08-24 | Disposition: A | Discharge status: Home / Self Care | Payer: Medicare Other | Source: Ambulatory Visit | Attending: Ophthalmology | Admitting: Ophthalmology

## 2020-08-24 ENCOUNTER — Encounter: Payer: Self-pay | Admitting: Oncology

## 2020-08-24 DIAGNOSIS — I639 Cerebral infarction, unspecified: Secondary | ICD-10-CM | POA: Diagnosis not present

## 2020-08-24 DIAGNOSIS — I6782 Cerebral ischemia: Secondary | ICD-10-CM | POA: Diagnosis not present

## 2020-08-24 DIAGNOSIS — H532 Diplopia: Secondary | ICD-10-CM

## 2020-08-24 MED ORDER — GADOBENATE DIMEGLUMINE 529 MG/ML IV SOLN
10.0000 mL | Freq: Once | INTRAVENOUS | Status: AC | PRN
  Administered 2020-08-24: 10 mL via INTRAVENOUS

## 2020-09-07 DIAGNOSIS — N312 Flaccid neuropathic bladder, not elsewhere classified: Secondary | ICD-10-CM | POA: Diagnosis not present

## 2020-09-18 ENCOUNTER — Other Ambulatory Visit: Payer: Self-pay | Admitting: Family Medicine

## 2020-09-18 DIAGNOSIS — E782 Mixed hyperlipidemia: Secondary | ICD-10-CM

## 2020-09-21 ENCOUNTER — Telehealth: Payer: Self-pay

## 2020-09-21 ENCOUNTER — Other Ambulatory Visit: Payer: Self-pay

## 2020-09-21 NOTE — Telephone Encounter (Signed)
Left message for pt to call back  °

## 2020-09-21 NOTE — Progress Notes (Signed)
Chief Complaint  Patient presents with   other    Med check no other issues   Patient presents for 6 month follow-up.  She had MRI last month, ordered by ophtho, related to complaints of diplopia. She reported that her vision changed since the pandemic started. She reports that if she didn't have prisms in her glasses, she would see double. Sees fine with the prisms.  IMPRESSION: 1. No acute intracranial abnormality. 2. Moderate chronic microvascular ischemic changes of the white matter and mild parenchymal volume loss. 3. Bilateral lens surgery and staphylomas. Otherwise, negative MRI of the orbits.  Hypertension and palpitations. She continues to do well off digoxin, without recurrence of palpitations/tachycardia.  A few weeks ago she felt like she MIGHT get palpitations, but it resolved. She is taking verapamil 339m, and is trying to limit the sodium in her diet. She hasn't been checking BP's at home. She denies any palpitations.  No headaches, dizziness, chest pain. BP Readings from Last 3 Encounters:  08/03/20 (!) 161/70  04/08/20 134/86  02/04/20 (!) 152/79   Constipation: previously reported being fine as long as she goes when she has the urge and doesn't hold it. Sometimes she isn't getting the urge, and stool gets very hard. She tried metamucil, didn't like it, went back to using Miralax instead.  She uses just a small amount in her coffee (not measured by the capful), and it doesn't seem to help.  She continues to take stool softeners at night.  She has been taking a ladies laxative (?correctol) 2x/week, as she doesn't seem to have the urge to go. She denies any blood in the stool or abdominal pain.   Hyperlipidemia follow-up: Patient is reportedly following a low cholesterol diet. Compliant with medications (atorvastatin 461m and denies medication side effects.  She continues to have [place] peppermint patties, daily after a meal (she finds it helps settle her stomach). Lipids  were at goal on last check: Lab Results  Component Value Date   CHOL 185 04/08/2020   HDL 91 04/08/2020   LDLCALC 74 04/08/2020   TRIG 114 04/08/2020   CHOLHDL 2.0 04/08/2020     Urinary retention--she has suprapubic catheter. She drains it about every 2-3 hours.  She goes to the urologist's office once a month to have the catheter changed, and sees Dr. EsJunious Silkearly. Denies pain, abnormal urine. Denies cloudy or bloody urine, no odor.    Osteoporosis: She has been taking Prolia every 6 months through Dr. MaVirgie Dadffice since 09/2012. Last treatment was in 07/2020. She is taking calcium and vitamin D. Not getting any weight-bearing exercise. DEXA last done 04/2019.  T-3.2 at L radius (down from -3), -2.9 at R fem neck, -1.6 at L fem neck (down from -1.2). She only takes caltrate once daily due to constipation (was worse when taking BID); hasn't restarted it since her rash (see below).  She continues to eat a lot of dairy products (cottage cheese, yogurt.)  She saw Dr. GoDelman Cheadleor a rash.  She was told to stop all of her vitamins, except for her AREDs vitamin. Rash was on legs and arms, treated with pills and a cream and resolved quickly. After a couple of months, she was told to restart her vitamins one at a time.  She restarted her D3 (about 6 weeks ago); hasn't restarted her Caltrate yet. She doesn't plan to restart Centrum.   H/o breast cancer:  She still sees Dr. MaJana Hakimearly. She last saw him in 07/2020.  She denies any breast concerns or complaints. Mammogram is UTD.   Left knee pain--s/p injections in the left knee, under the care of Dr. Wynelle Link. She has been told that she has bone on bone. Injections have been helpful.  Last injection in L knee was in April, will see him again soon. She takes 2 extra strength tylenol every morning, and takes 1 more in the afternoon when she starts with pain.  Only occasionally takes advil (maybe 1 tablet/week). She has some bilateral hip pains just  first thing in the morning, then moves into her legs.  She denies depression, is happy living by herself, no desire to move.  Anxiety: infrequent, uses Valium very sparingly, most often needs related to travel.  Last filled 12/30/19 for #30.    PMH, PSH, SH reviewed  Outpatient Encounter Medications as of 09/22/2020  Medication Sig Note   acetaminophen (TYLENOL) 500 MG tablet Take 1,000 mg by mouth every 6 (six) hours as needed.    atorvastatin (LIPITOR) 40 MG tablet TAKE 1 TABLET(40 MG) BY MOUTH EVERY EVENING    Cholecalciferol (VITAMIN D3) 50 MCG (2000 UT) TABS Take 2,000 Units by mouth daily.    denosumab (PROLIA) 60 MG/ML SOLN injection Inject 60 mg into the skin See admin instructions. Administer in upper arm, thigh, or abdomen 12/13/2016: Every 6 months   Multiple Vitamins-Minerals (ICAPS AREDS 2) CAPS Take 1 capsule by mouth 2 (two) times daily. 09/22/2020: Stopped taking   polyethylene glycol (MIRALAX / GLYCOLAX) packet Take 17 g by mouth daily as needed for moderate constipation. 09/22/2020: Uses just a small amount daily   verapamil (VERELAN PM) 360 MG 24 hr capsule TAKE 1 CAPSULE(360 MG) BY MOUTH DAILY    clindamycin (CLEOCIN) 300 MG capsule clindamycin HCl 300 mg capsule  TAKE 2 CAPSULES BY MOUTH 1 HOUR PRIOR TO DENTAL APPOINTMENT (Patient not taking: No sig reported)    hydrocortisone (ANUSOL-HC) 2.5 % rectal cream APPLY EXTERNALLY TO THE AFFECTED AREA TWICE DAILY (Patient not taking: No sig reported)    loratadine (CLARITIN) 10 MG tablet Take 10 mg by mouth daily as needed for allergies.  (Patient not taking: No sig reported)    Multiple Vitamins-Minerals (CENTRUM ADULTS PO) Take 1 tablet by mouth daily. (Patient not taking: Reported on 09/22/2020) 09/22/2020: Stopped taking   VALIUM 2 MG tablet TAKE 1 TABLET BY MOUTH EVERY 8 HOURS FOR MUSCLE SPASMS (Patient not taking: No sig reported) 09/22/2020: Prn very rarely   [DISCONTINUED] psyllium (METAMUCIL) 58.6 % packet Take 1 packet by mouth  daily. (Patient not taking: Reported on 09/22/2020)    No facility-administered encounter medications on file as of 09/22/2020.   Allergies  Allergen Reactions   Azithromycin Nausea Only   Arimidex [Anastrozole] Rash   Avelox [Moxifloxacin Hcl In Nacl] Nausea Only and Rash   Bactrim Nausea Only and Rash   Biaxin [Clarithromycin] Nausea Only and Rash   Macrobid [Nitrofurantoin] Nausea Only and Rash   Penicillins Nausea Only and Rash    Has patient had a PCN reaction causing immediate rash, facial/tongue/throat swelling, SOB or lightheadedness with hypotension: no Has patient had a PCN reaction causing severe rash involving mucus membranes or skin necrosis: no Has patient had a PCN reaction that required hospitalization no Has patient had a PCN reaction occurring within the last 10 years no If all of the above answers are "NO", then may proceed with Cephalosporin use.    Sulfa Antibiotics Nausea And Vomiting and Rash    ROS:  No headaches, dizziness, URI symptoms, chest pain, shortness of breath, nausea, vomiting, bowel changes. No urinary complaints. Constipation per HPI Knee pain per HPI Appetite has improved.   PHYSICAL EXAM:  BP 130/82   Pulse 77   Temp 97.8 F (36.6 C)   Wt 119 lb 9.6 oz (54.3 kg)   LMP  (LMP Unknown)   BMI 21.19 kg/m   Wt Readings from Last 3 Encounters:  09/22/20 119 lb 9.6 oz (54.3 kg)  08/03/20 115 lb 14.4 oz (52.6 kg)  04/08/20 115 lb (52.2 kg)   Pleasant, elderly female, in no distress. HEENT: conjunctiva and sclera are clear, EOMI. Wearing mask Neck: no lymphadenopathy, thyromegaly or mass Heart: Regular rate and rhythm.  Lungs: clear bilaterally Back: no spinal or CVA tenderness Abdomen: soft, nontender, no mass Extremities: no edema. Skin: normal turgor, no rash Neuro: alert and oriented, normal gait Psych: normal mood, affect, hygiene and grooming. Normal speech, eye contact.  Normal CBC, c-met in 07/2020 (nonfasting, glu  101)   ASSESSMENT/PLAN:  Essential hypertension, benign - well controlled, cont verapamil, low Na diet  Mixed hyperlipidemia - cont statin, low cholesterol diet  Presence of suprapubic catheter ([place]) - doing well, denies problems, under care of urologist.  Osteoporosis without current pathological fracture, unspecified osteoporosis type  Palpitations - well controlled with verapamil, cont  Constipation, unspecified constipation type - encouraged to increase Miralax dose--to full capful until getting good results, then can lower dose based on stool frequency/looseness. high fiber diet rec  6 months AWV with fasting labs prior--glu, lipid (gets cbc, c-met done yearly by oncologist, done 07/2020, nomal).  Lipitor refilled (through RF request) x 6 mos

## 2020-09-21 NOTE — Telephone Encounter (Signed)
Received fax from [facility] for a refill on the pts. Atorvastatin pt. Last apt was 04/08/20 and next apt is 09/22/20

## 2020-09-22 ENCOUNTER — Ambulatory Visit (INDEPENDENT_AMBULATORY_CARE_PROVIDER_SITE_OTHER): Payer: Medicare Other | Admitting: Family Medicine

## 2020-09-22 ENCOUNTER — Encounter: Payer: Self-pay | Admitting: Family Medicine

## 2020-09-22 ENCOUNTER — Other Ambulatory Visit: Payer: Self-pay

## 2020-09-22 VITALS — BP 130/82 | HR 77 | Temp 97.8°F | Wt 119.6 lb

## 2020-09-22 DIAGNOSIS — Z9359 Other cystostomy status: Secondary | ICD-10-CM

## 2020-09-22 DIAGNOSIS — E782 Mixed hyperlipidemia: Secondary | ICD-10-CM

## 2020-09-22 DIAGNOSIS — K5900 Constipation, unspecified: Secondary | ICD-10-CM

## 2020-09-22 DIAGNOSIS — M810 Age-related osteoporosis without current pathological fracture: Secondary | ICD-10-CM | POA: Diagnosis not present

## 2020-09-22 DIAGNOSIS — Z131 Encounter for screening for diabetes mellitus: Secondary | ICD-10-CM | POA: Diagnosis not present

## 2020-09-22 DIAGNOSIS — I10 Essential (primary) hypertension: Secondary | ICD-10-CM | POA: Diagnosis not present

## 2020-09-22 DIAGNOSIS — R002 Palpitations: Secondary | ICD-10-CM | POA: Diagnosis not present

## 2020-09-22 NOTE — Patient Instructions (Addendum)
Try and use stimulant laxatives (the "pink pills") sparingly.  These aren't as good to use regularly.  It is better to use Miralax on a regular basis to control your bowels. I recommend taking 1 capful of Miralax daily until your bowels are more regular. Then you likely can cut back (if your stools are too loose or too frequent) to 1/2 capful daily, or use a full capful just 2-3 times/week, adjusting the amount based on the stool frequency and how loose they are.  Be sure to drink plenty of water (your urine should be very clear), and eat a high fiber diet.  High-Fiber Eating Plan Fiber, also called dietary fiber, is a type of carbohydrate. It is found foods such as fruits, vegetables, whole grains, and beans. A high-fiber diet can have many health benefits. Your health care provider may recommend a high-fiber diet to help: Prevent constipation. Fiber can make your bowel movements more regular. Lower your cholesterol. Relieve the following conditions: Inflammation of veins in the anus (hemorrhoids). Inflammation of specific areas of the digestive tract (uncomplicated diverticulosis). A problem of the large intestine, also called the colon, that sometimes causes pain and diarrhea (irritable bowel syndrome, or IBS). Prevent overeating as part of a weight-loss plan. Prevent heart disease, type 2 diabetes, and certain cancers. What are tips for following this plan? Reading food labels  Check the nutrition facts label on food products for the amount of dietary fiber. Choose foods that have 5 grams of fiber or more per serving. The goals for recommended daily fiber intake include: Men (age 60 or younger): 34-38 g. Men (over age 75): 28-34 g. Women (age 28 or younger): 25-28 g. Women (over age 31): 22-25 g. Your daily fiber goal is _____________ g. Shopping Choose whole fruits and vegetables instead of processed forms, such as apple juice or applesauce. Choose a wide variety of high-fiber foods  such as avocados, lentils, oats, and kidney beans. Read the nutrition facts label of the foods you choose. Be aware of foods with added fiber. These foods often have high sugar and sodium amounts per serving. Cooking Use whole-grain flour for baking and cooking. Cook with brown rice instead of white rice. Meal planning Start the day with a breakfast that is high in fiber, such as a cereal that contains 5 g of fiber or more per serving. Eat breads and cereals that are made with whole-grain flour instead of refined flour or white flour. Eat brown rice, bulgur wheat, or millet instead of white rice. Use beans in place of meat in soups, salads, and pasta dishes. Be sure that half of the grains you eat each day are whole grains. General information You can get the recommended daily intake of dietary fiber by: Eating a variety of fruits, vegetables, grains, nuts, and beans. Taking a fiber supplement if you are not able to take in enough fiber in your diet. It is better to get fiber through food than from a supplement. Gradually increase how much fiber you consume. If you increase your intake of dietary fiber too quickly, you may have bloating, cramping, or gas. Drink plenty of water to help you digest fiber. Choose high-fiber snacks, such as berries, raw vegetables, nuts, and popcorn. What foods should I eat? Fruits Berries. Pears. Apples. Oranges. Avocado. Prunes and raisins. Dried figs. Vegetables Sweet potatoes. Spinach. Kale. Artichokes. Cabbage. Broccoli. Cauliflower.Green peas. Carrots. Squash. Grains Whole-grain breads. Multigrain cereal. Oats and oatmeal. Brown rice. Barley.Bulgur wheat. [place]. Quinoa. Bran muffins. Popcorn. Rye  wafer crackers. Meats and other proteins Navy beans, kidney beans, and pinto beans. Soybeans. Split peas. Lentils. Nutsand seeds. Dairy Fiber-fortified yogurt. Beverages Fiber-fortified soy milk. Fiber-fortified orange juice. Other foods Fiber bars. The  items listed above may not be a complete list of recommended foods and beverages. Contact a dietitian for more information. What foods should I avoid? Fruits Fruit juice. Cooked, strained fruit. Vegetables Fried potatoes. Canned vegetables. Well-cooked vegetables. Grains White bread. Pasta made with refined flour. White rice. Meats and other proteins Fatty cuts of meat. Fried chicken or fried fish. Dairy Milk. Yogurt. Cream cheese. Sour cream. Fats and oils Butters. Beverages Soft drinks. Other foods Cakes and pastries. The items listed above may not be a complete list of foods and beverages to avoid. Talk with your dietitian about what choices are best for you. Summary Fiber is a type of carbohydrate. It is found in foods such as fruits, vegetables, whole grains, and beans. A high-fiber diet has many benefits. It can help to prevent constipation, lower blood cholesterol, aid weight loss, and reduce your risk of heart disease, diabetes, and certain cancers. Increase your intake of fiber gradually. Increasing fiber too quickly may cause cramping, bloating, and gas. Drink plenty of water while you increase the amount of fiber you consume. The best sources of fiber include whole fruits and vegetables, whole grains, nuts, seeds, and beans. This information is not intended to replace advice given to you by your health care provider. Make sure you discuss any questions you have with your healthcare provider. Document Revised: 07/10/2019 Document Reviewed: 07/10/2019 Elsevier Patient Education  [place].   Calcium Content in Foods Calcium is the most abundant mineral in the body. Most of the body's calcium supply is stored in bones and teeth. Calcium helps many parts of the body function normally, including: Blood and blood vessels. Nerves. Hormones. Muscles. Bones and teeth. When your calcium stores are low, you may be at risk for low bone mass, bone loss, and broken bones  (fractures). When you get enough calcium, it helps to support strong bones and teeththroughout your life. Calcium is especially important for: Children during growth spurts. Girls during adolescence. Women who are pregnant or breastfeeding. Women after their menstrual cycle stops (postmenopause). Women whose menstrual cycle has stopped due to anorexia nervosa or regular intense exercise. People who cannot eat or digest dairy products. Vegans. Recommended daily amounts of calcium: Women (ages 5 to 1): 1,000 mg per day. Women (ages 38 and older): 1,200 mg per day. Men (ages 71 to 18): 1,000 mg per day. Men (ages 93 and older): 1,200 mg per day. Women (ages 42 to 65): 1,300 mg per day. Men (ages 63 to 84): 1,300 mg per day. General information Eat foods that are high in calcium. Try to get most of your calcium from food. Some people may benefit from taking calcium supplements. Check with your health care provider or diet and nutrition specialist (dietitian) before starting any calcium supplements. Calcium supplements may interact with certain medicines. Too much calcium may cause other health problems, such as constipation and kidney stones. For the body to absorb calcium, it needs vitamin D. Sources of vitamin D include: Skin exposure to direct sunlight. Foods, such as egg yolks, liver, mushrooms, saltwater fish, and fortified milk. Vitamin D supplements. Check with your health care provider or dietitian before starting any vitamin D supplements. What foods are high in calcium?  Foods that are high in calcium contain more than 100 milligrams per  serving. Fruits Fortified orange juice or other fruit juice, 300 mg per 8 oz serving. Vegetables Collard greens, 360 mg per 8 oz serving. Kale, 100 mg per 8 oz serving. Bok choy, 160 mg per 8 oz serving. Grains Fortified ready-to-eat cereals, 100 to 1,000 mg per 8 oz serving. Fortified frozen waffles, 200 mg in 2 waffles. Oatmeal, 140 mg in 1  cup. Meats and other proteins Sardines, canned with bones, 325 mg per 3 oz serving. Salmon, canned with bones, 180 mg per 3 oz serving. Canned shrimp, 125 mg per 3 oz serving. Baked beans, 160 mg per 4 oz serving. Tofu, firm, made with calcium sulfate, 253 mg per 4 oz serving. Dairy Yogurt, plain, low-fat, 310 mg per 6 oz serving. Nonfat milk, 300 mg per 8 oz serving. American cheese, 195 mg per 1 oz serving. Cheddar cheese, 205 mg per 1 oz serving. Cottage cheese 2%, 105 mg per 4 oz serving. Fortified soy, rice, or almond milk, 300 mg per 8 oz serving. Mozzarella, part skim, 210 mg per 1 oz serving. The items listed above may not be a complete list of foods high in calcium. Actual amounts of calcium may be different depending on processing. Contact a dietitian for more information. What foods are lower in calcium? Foods that are lower in calcium contain 50 mg or less per serving. Fruits Apple, about 6 mg. Banana, about 12 mg. Vegetables Lettuce, 19 mg per 2 oz serving. Tomato, about 11 mg. Grains Rice, 4 mg per 6 oz serving. Boiled potatoes, 14 mg per 8 oz serving. White bread, 6 mg per slice. Meats and other proteins Egg, 27 mg per 2 oz serving. Red meat, 7 mg per 4 oz serving. Chicken, 17 mg per 4 oz serving. Fish, cod, or trout, 20 mg per 4 oz serving. Dairy Cream cheese, regular, 14 mg per 1 Tbsp serving. Brie cheese, 50 mg per 1 oz serving. Parmesan cheese, 70 mg per 1 Tbsp serving. The items listed above may not be a complete list of foods lower in calcium. Actual amounts of calcium may be different depending on processing. Contact a dietitian for more information. Summary Calcium is an important mineral in the body because it affects many functions. Getting enough calcium helps support strong bones and teeth throughout your life. Try to get most of your calcium from food. Calcium supplements may interact with certain medicines. Check with your health care provider or  dietitian before starting any calcium supplements. This information is not intended to replace advice given to you by your health care provider. Make sure you discuss any questions you have with your healthcare provider. Document Revised: 07/02/2019 Document Reviewed: 07/02/2019 Elsevier Patient Education  [place].

## 2020-09-22 NOTE — Telephone Encounter (Signed)
Tried to call pt to see if she needed this for appt but didn't answer. Please advise at appt.

## 2020-10-04 ENCOUNTER — Encounter: Payer: Self-pay | Admitting: Family Medicine

## 2020-10-04 ENCOUNTER — Ambulatory Visit (INDEPENDENT_AMBULATORY_CARE_PROVIDER_SITE_OTHER): Payer: Medicare Other | Admitting: Family Medicine

## 2020-10-04 ENCOUNTER — Other Ambulatory Visit: Payer: Self-pay

## 2020-10-04 VITALS — BP 140/80 | HR 80 | Temp 98.1°F | Ht 63.0 in | Wt 120.2 lb

## 2020-10-04 DIAGNOSIS — H9201 Otalgia, right ear: Secondary | ICD-10-CM | POA: Diagnosis not present

## 2020-10-04 NOTE — Progress Notes (Signed)
Chief Complaint  Patient presents with   Ear Pain    B/L ear pain x 2 weeks. R worse than L. Called Dr. Constance Holster last Thursday and he couldn't see her until August. Last time she had her ears cleaned by ENT 4-5 months ago. Feels somewhat better today.    2-3 weeks ago she started having recurrent earache.  Got worse, so last Thursday she called ENT, but they couldn't see her until August, PA could see her a little sooner. She scheduled appt for evaluation here instead, and she reports that today is the best it has felt in a long time.  R ear hurts  more than the left Sometimes it feels like she is talking in a vacuum. Denies significant change in her hearing. Currently denies ear pain, decreased hearing, URI symptoms   PMH, PSH, SH reviewed  Outpatient Encounter Medications as of 10/04/2020  Medication Sig Note   acetaminophen (TYLENOL) 500 MG tablet Take 1,000 mg by mouth every 6 (six) hours as needed.    atorvastatin (LIPITOR) 40 MG tablet TAKE 1 TABLET(40 MG) BY MOUTH EVERY EVENING    Calcium Carbonate (CALTRATE 600 PO) Take 1 tablet by mouth daily.    Cholecalciferol (VITAMIN D3) 50 MCG (2000 UT) TABS Take 2,000 Units by mouth daily.    denosumab (PROLIA) 60 MG/ML SOLN injection Inject 60 mg into the skin See admin instructions. Administer in upper arm, thigh, or abdomen 12/13/2016: Every 6 months   Multiple Vitamins-Minerals (ICAPS AREDS 2) CAPS Take 1 capsule by mouth 2 (two) times daily. 09/22/2020: Stopped taking   polyethylene glycol (MIRALAX / GLYCOLAX) packet Take 17 g by mouth daily as needed for moderate constipation. 09/22/2020: Uses just a small amount daily   verapamil (VERELAN PM) 360 MG 24 hr capsule TAKE 1 CAPSULE(360 MG) BY MOUTH DAILY    clindamycin (CLEOCIN) 300 MG capsule clindamycin HCl 300 mg capsule  TAKE 2 CAPSULES BY MOUTH 1 HOUR PRIOR TO DENTAL APPOINTMENT (Patient not taking: No sig reported)    hydrocortisone (ANUSOL-HC) 2.5 % rectal cream APPLY EXTERNALLY TO THE  AFFECTED AREA TWICE DAILY (Patient not taking: No sig reported)    loratadine (CLARITIN) 10 MG tablet Take 10 mg by mouth daily as needed for allergies.  (Patient not taking: Reported on 10/04/2020)    Multiple Vitamins-Minerals (CENTRUM ADULTS PO) Take 1 tablet by mouth daily. (Patient not taking: No sig reported) 09/22/2020: Stopped taking   VALIUM 2 MG tablet TAKE 1 TABLET BY MOUTH EVERY 8 HOURS FOR MUSCLE SPASMS (Patient not taking: No sig reported) 09/22/2020: Prn very rarely   No facility-administered encounter medications on file as of 10/04/2020.   Allergies  Allergen Reactions   Azithromycin Nausea Only   Arimidex [Anastrozole] Rash   Avelox [Moxifloxacin Hcl In Nacl] Nausea Only and Rash   Bactrim Nausea Only and Rash   Biaxin [Clarithromycin] Nausea Only and Rash   Macrobid [Nitrofurantoin] Nausea Only and Rash   Penicillins Nausea Only and Rash    Has patient had a PCN reaction causing immediate rash, facial/tongue/throat swelling, SOB or lightheadedness with hypotension: no Has patient had a PCN reaction causing severe rash involving mucus membranes or skin necrosis: no Has patient had a PCN reaction that required hospitalization no Has patient had a PCN reaction occurring within the last 10 years no If all of the above answers are "NO", then may proceed with Cephalosporin use.    Sulfa Antibiotics Nausea And Vomiting and Rash   ROS: no fever,  chills, runny nose, sore throat, cough.  R>L ear pain per HPI.  No jaw pain.  No chest pain, palpitations or any other complaints.   PHYSICAL EXAM;  BP 140/80   Pulse 80   Temp 98.1 F (36.7 C) (Oral)   Ht 5\' 3"  (1.6 m)   Wt 120 lb 3.2 oz (54.5 kg)   LMP  (LMP Unknown)   BMI 21.29 kg/m   Well appearing elderly female, in good spirits HEENT: conjunctiva and sclera are clear, EOMI. TM's and EACs are normal bilaterally. No cerumen noted.  Nasal mucosa is normal. OP is clear. Sinuses are nontender Neck: no lymphadenopathy or  mass Heart: regular rate and rhythm Lungs: clear bilaterally Psych: normal mood, affect, hygiene and grooming   ASSESSMENT/PLAN:  Right ear pain - resolved today, normal exam.  Ddx reviewed, suspect poss ETD, to use claritin or flonase if sx recur. Pt reassured

## 2020-10-04 NOTE — Patient Instructions (Signed)
Your exam was normal--there is no ear wax or any ear infection. If you have recurrent discomfort, I'd have to think that eustachian tube dysfunction is the cause (and using Flonase or claritin might helps).  You can try Tylenol Arthritis instead of extra strength Tylenol--this is a little longer-acting, so might work better for you. Follow the direction on the bottle regarding how much/often to use.

## 2020-10-05 DIAGNOSIS — N312 Flaccid neuropathic bladder, not elsewhere classified: Secondary | ICD-10-CM | POA: Diagnosis not present

## 2020-10-08 DIAGNOSIS — M1712 Unilateral primary osteoarthritis, left knee: Secondary | ICD-10-CM | POA: Diagnosis not present

## 2020-10-08 DIAGNOSIS — M26629 Arthralgia of temporomandibular joint, unspecified side: Secondary | ICD-10-CM | POA: Diagnosis not present

## 2020-10-08 DIAGNOSIS — Z96651 Presence of right artificial knee joint: Secondary | ICD-10-CM | POA: Diagnosis not present

## 2020-10-08 DIAGNOSIS — M25552 Pain in left hip: Secondary | ICD-10-CM | POA: Diagnosis not present

## 2020-10-16 ENCOUNTER — Other Ambulatory Visit: Payer: Self-pay | Admitting: Family Medicine

## 2020-10-16 DIAGNOSIS — R002 Palpitations: Secondary | ICD-10-CM

## 2020-10-16 DIAGNOSIS — I10 Essential (primary) hypertension: Secondary | ICD-10-CM

## 2020-10-20 DIAGNOSIS — M25552 Pain in left hip: Secondary | ICD-10-CM | POA: Diagnosis not present

## 2020-10-29 DIAGNOSIS — N312 Flaccid neuropathic bladder, not elsewhere classified: Secondary | ICD-10-CM | POA: Diagnosis not present

## 2020-11-26 DIAGNOSIS — M25552 Pain in left hip: Secondary | ICD-10-CM | POA: Diagnosis not present

## 2020-11-26 DIAGNOSIS — M170 Bilateral primary osteoarthritis of knee: Secondary | ICD-10-CM | POA: Diagnosis not present

## 2020-11-30 DIAGNOSIS — N312 Flaccid neuropathic bladder, not elsewhere classified: Secondary | ICD-10-CM | POA: Diagnosis not present

## 2020-12-17 ENCOUNTER — Other Ambulatory Visit: Payer: Medicare Other

## 2020-12-23 ENCOUNTER — Other Ambulatory Visit: Payer: Self-pay

## 2020-12-23 ENCOUNTER — Other Ambulatory Visit (INDEPENDENT_AMBULATORY_CARE_PROVIDER_SITE_OTHER): Payer: Medicare Other

## 2020-12-23 DIAGNOSIS — Z23 Encounter for immunization: Secondary | ICD-10-CM

## 2020-12-25 ENCOUNTER — Emergency Department: Admit: 2020-12-25 | Payer: Self-pay

## 2020-12-25 ENCOUNTER — Emergency Department (INDEPENDENT_AMBULATORY_CARE_PROVIDER_SITE_OTHER)
Admission: EM | Admit: 2020-12-25 | Discharge: 2020-12-25 | Disposition: A | Discharge status: Home / Self Care | Payer: Medicare Other | Source: Home / Self Care

## 2020-12-25 ENCOUNTER — Other Ambulatory Visit: Payer: Self-pay

## 2020-12-25 DIAGNOSIS — N3001 Acute cystitis with hematuria: Secondary | ICD-10-CM

## 2020-12-25 DIAGNOSIS — R300 Dysuria: Secondary | ICD-10-CM

## 2020-12-25 LAB — POCT URINALYSIS DIP (MANUAL ENTRY)
Bilirubin, UA: NEGATIVE
Glucose, UA: NEGATIVE mg/dL
Ketones, POC UA: NEGATIVE mg/dL
Nitrite, UA: NEGATIVE
Protein Ur, POC: 30 mg/dL — AB
Spec Grav, UA: 1.01 (ref 1.010–1.025)
Urobilinogen, UA: 0.2 E.U./dL
pH, UA: 7 (ref 5.0–8.0)

## 2020-12-25 MED ORDER — DOXYCYCLINE HYCLATE 100 MG PO CAPS: 100.0000 mg | ORAL_CAPSULE | Freq: Two times a day (BID) | ORAL | 0 refills | Status: AC

## 2020-12-25 NOTE — Discharge Instructions (Addendum)
Advised patient to take medication as directed with food to completion.  Encourage patient increase daily water intake while taking this medication.  Advised we will follow-up with urine culture results once received.

## 2020-12-25 NOTE — ED Triage Notes (Signed)
Pt presents to Urgent Care with c/o urethral discomfort since yesterday. Pt has suprapubic catheter but has occasional "spillage" from urethra. She reports recent episode of diarrhea and states she suspects UTI d/t hygiene.

## 2020-12-25 NOTE — ED Provider Notes (Signed)
[facility]    CSN: [id number] Arrival date & time: 12/25/20  1606      History   Chief Complaint Chief Complaint  Patient presents with   Dysuria    HPI Mary [name] is a 87 y.o. female.   HPI 87 year old female presents with dysuria/urethral discomfort since yesterday.  Patient is accompanied by her daughter this afternoon and reports has suprapubic catheter for occasional spillage from urethra.  Past Medical History:  Diagnosis Date   Alopecia    Ashkenazi Jewish ancestry    External hemorrhoid    Foley catheter in place    Heart palpitations no cardiologist--  monitored by pcp   per pt "has been on verapamil and lanoxin for years and no palpitations for a very long time"   History of acute bronchitis    dx 12-21-2014  finished [facility]   History of adenomatous polyp of colon    2003   History of basal cell carcinoma excision    2006  left nasolabial fold   History of benign colon tumor    2007  --  HIGH GRADE HYPERPLASTIC ,  S/P  LEFT HEMICOLECTOMY   History of breast cancer ONCOLOGIST-  DR [name]--  antiestogen therapy with femera completed 12/  2012--  no recurrence   dx 10/  2007  right breast Invasive DCIS, grade 2, Stage 2A, pT2  pN0 pMX,  (ER 100% /PR 3% +),  HER +3) with 1 metastatic axill node---  s/p  right mastectomy    Hypotonic bladder UROLOGIST-  DR [name]   Mixed hyperlipidemia    OA (osteoarthritis)    hip   Osteoporosis    DEXA 01/2009; T-3.2 L fem neck, -2.4 L radius   Urinary retention    02-13-2015    Patient Active Problem List   Diagnosis Date Noted   Ashkenazi Jewish ancestry    OA (osteoarthritis) of knee 01/24/2013   Osteoporosis 06/01/2011   Malignant neoplasm of upper-outer quadrant of right breast in female, estrogen receptor positive ([place]) 04/03/2011   Essential hypertension, benign 12/01/2010   Mixed hyperlipidemia 10/19/2010   Palpitations 10/19/2010   Atony of bladder 10/19/2010    Past Surgical History:   Procedure Laterality Date   APPENDECTOMY  age 57   BREAST LUMPECTOMY WITH NEEDLE LOCALIZATION AND AXILLARY SENTINEL LYMPH NODE BX Right 01-23-2006   CARPAL TUNNEL RELEASE Right 04-24-2007   and Pulley Release index and small finger   CATARACT EXTRACTION W/ INTRAOCULAR LENS  IMPLANT, BILATERAL  left 1991  &  right 1993   COLONOSCOPY  last one 2013   CYSTOSCOPY N/A 07/12/2015   Procedure: CYSTOSCOPY;  Surgeon: [name], MD;  Location: [facility];  Service: Urology;  Laterality: N/A;   CYSTOSCOPY WITH BIOPSY N/A 03/01/2015   Procedure: CYSTOSCOPY WITH TAUBER BIOPSY OF 1CM RIGHT LATERAL BLADDER WALL AND CAUTERIZATION OF BIOPSY SITE ;  Surgeon: [name], MD;  Location: [facility];  Service: Urology;  Laterality: N/A;   INSERTION OF SUPRAPUBIC CATHETER N/A 07/12/2015   Procedure: INSERTION OF SUPRAPUBIC CATHETER;  Surgeon: [name], MD;  Location: [facility];  Service: Urology;  Laterality: N/A;   LAPAROSCOPIC ASSISTED LEFT HEMICOLECTOMY  06/  2007   high grade hyperplastic mass   MASTECTOMY Right 02-28-2006   PARTIAL KNEE ARTHROPLASTY Right 01/24/2013   Procedure: RIGHT KNEE MEDIAL UNICOMPARTMENTAL ARTHROPLASTY;  Surgeon: Gearlean Alf, MD;  Location: [facility];  Service: Orthopedics;  Laterality: Right;   PULLEY  RELEASE LEFT INDEX AND SMALL FINGER  08-07-2007   TONSILLECTOMY  age 37    OB History     Gravida  3   Para  3   Term      Preterm      AB      Living  3      SAB      IAB      Ectopic      Multiple      Live Births               Home Medications    Prior to Admission medications   Medication Sig Start Date End Date Taking? Authorizing Provider  doxycycline (VIBRAMYCIN) 100 MG capsule Take 1 capsule (100 mg total) by mouth 2 (two) times daily for 7 days. 12/25/20 01/01/21 Yes Eliezer Lofts, FNP  acetaminophen (TYLENOL) 500 MG tablet Take 1,000 mg by mouth every 6 (six) hours as  needed.    [provider]  atorvastatin (LIPITOR) 40 MG tablet TAKE 1 TABLET(40 MG) BY MOUTH EVERY EVENING 09/22/20   Rita Ohara, MD  Calcium Carbonate (CALTRATE 600 PO) Take 1 tablet by mouth daily.    [provider]  Cholecalciferol (VITAMIN D3) 50 MCG (2000 UT) TABS Take 2,000 Units by mouth daily. 08/03/20   Magrinat, Virgie Dad, MD  clindamycin (CLEOCIN) 300 MG capsule clindamycin HCl 300 mg capsule  TAKE 2 CAPSULES BY MOUTH 1 HOUR PRIOR TO DENTAL APPOINTMENT Patient not taking: No sig reported    [provider]  denosumab (PROLIA) 60 MG/ML SOLN injection Inject 60 mg into the skin See admin instructions. Administer in upper arm, thigh, or abdomen 10/24/12   [provider]  hydrocortisone (ANUSOL-HC) 2.5 % rectal cream APPLY EXTERNALLY TO THE AFFECTED AREA TWICE DAILY Patient not taking: No sig reported 07/03/18   Rita Ohara, MD  loratadine (CLARITIN) 10 MG tablet Take 10 mg by mouth daily as needed for allergies.  Patient not taking: Reported on 10/04/2020    [provider]  Multiple Vitamins-Minerals (CENTRUM ADULTS PO) Take 1 tablet by mouth daily. Patient not taking: No sig reported    [provider]  Multiple Vitamins-Minerals (ICAPS AREDS 2) CAPS Take 1 capsule by mouth 2 (two) times daily.    [provider]  polyethylene glycol (MIRALAX / GLYCOLAX) packet Take 17 g by mouth daily as needed for moderate constipation.    [provider]  VALIUM 2 MG tablet TAKE 1 TABLET BY MOUTH EVERY 8 HOURS FOR MUSCLE SPASMS Patient not taking: No sig reported 12/29/19   Rita Ohara, MD  verapamil (VERELAN PM) 360 MG 24 hr capsule TAKE 1 CAPSULE(360 MG) BY MOUTH DAILY 10/18/20   Rita Ohara, MD    Family History Family History  Problem Relation Age of Onset   Hypertension Mother    Stroke Father    Diabetes Father    Hyperlipidemia Brother    Atrial fibrillation Brother    Hyperlipidemia Son    Breast cancer Paternal Aunt         dx 46s; deceased early 34s   Breast cancer Cousin        paternal first cousin   Colon cancer Cousin        paternal first cousin; age dx 18s    Social History Social History   Tobacco Use   Smoking status: Never   Smokeless tobacco: Never  Vaping Use   Vaping Use: Never used  Substance Use Topics  Alcohol use: Yes    Comment: Very seldom   Drug use: No     Allergies   Azithromycin, Arimidex [anastrozole], Avelox [moxifloxacin hcl in nacl], Bactrim, Biaxin [clarithromycin], Macrobid [nitrofurantoin], Penicillins, and Sulfa antibiotics   Review of Systems Review of Systems  Genitourinary:  Positive for dysuria.  All other systems reviewed and are negative.   Physical Exam Triage Vital Signs ED Triage Vitals  Enc Vitals Group     BP      Pulse      Resp      Temp      Temp src      SpO2      Weight      Height      Head Circumference      Peak Flow      Pain Score      Pain Loc      Pain Edu?      Excl. in [place]?    No data found.  Updated Vital Signs BP (!) 130/98 (BP Location: Left Arm)   Pulse 82   Temp 98.6 F (37 C) (Oral)   Resp 20   Ht 5\' 3"  (1.6 m)   Wt 120 lb (54.4 kg)   LMP  (LMP Unknown)   SpO2 93%   BMI 21.26 kg/m   Physical Exam Vitals and nursing note reviewed.  Constitutional:      General: She is not in acute distress.    Appearance: Normal appearance. She is normal weight. She is not ill-appearing.  HENT:     Head: Normocephalic and atraumatic.     Mouth/Throat:     Mouth: Mucous membranes are moist.     Pharynx: Oropharynx is clear.  Eyes:     Extraocular Movements: Extraocular movements intact.     Conjunctiva/sclera: Conjunctivae normal.     Pupils: Pupils are equal, round, and reactive to light.  Cardiovascular:     Rate and Rhythm: Normal rate and regular rhythm.     Pulses: Normal pulses.     Heart sounds: Normal heart sounds.  Pulmonary:     Effort: Pulmonary effort is normal.     Breath sounds: Normal breath  sounds.  Abdominal:     Tenderness: There is no right CVA tenderness or left CVA tenderness.  Musculoskeletal:     Cervical back: Normal range of motion and neck supple.  Skin:    General: Skin is warm and dry.  Neurological:     General: No focal deficit present.     Mental Status: She is alert and oriented to person, place, and time. Mental status is at baseline.  Psychiatric:        Mood and Affect: Mood normal.        Behavior: Behavior normal.        Thought Content: Thought content normal.     UC Treatments / Results  Labs (all labs ordered are listed, but only abnormal results are displayed) Labs Reviewed  POCT URINALYSIS DIP (MANUAL ENTRY) - Abnormal; Notable for the following components:      Result Value   Blood, UA small (*)    Protein Ur, POC =30 (*)    Leukocytes, UA Moderate (2+) (*)    All other components within normal limits  URINE CULTURE    EKG   Radiology No results found.  Procedures Procedures (including critical care time)  Medications Ordered in UC Medications - No data to display  Initial Impression / Assessment and  Plan / UC Course  I have reviewed the triage vital signs and the nursing notes.  Pertinent labs & imaging results that were available during my care of the patient were reviewed by me and considered in my medical decision making (see chart for details).     MDM: 1.  Acute cystitis with hematuria-Rx'd Doxycycline. Advised patient to take medication as directed with food to completion.  Encourage patient increase daily water intake while taking this medication.  Advised we will follow-up with urine culture results once received.  Patient discharged home, hemodynamically stable. Final Clinical Impressions(s) / UC Diagnoses   Final diagnoses:  Dysuria  Acute cystitis with hematuria     Discharge Instructions      Advised patient to take medication as directed with food to completion.  Encourage patient increase daily water  intake while taking this medication.  Advised we will follow-up with urine culture results once received.     ED Prescriptions     Medication Sig Dispense Auth. Provider   doxycycline (VIBRAMYCIN) 100 MG capsule Take 1 capsule (100 mg total) by mouth 2 (two) times daily for 7 days. 14 capsule Eliezer Lofts, FNP      PDMP not reviewed this encounter.   Eliezer Lofts, [place] 12/25/20 1650

## 2020-12-26 ENCOUNTER — Other Ambulatory Visit: Payer: Self-pay | Admitting: Family Medicine

## 2020-12-26 DIAGNOSIS — E782 Mixed hyperlipidemia: Secondary | ICD-10-CM

## 2020-12-28 ENCOUNTER — Telehealth: Payer: Self-pay

## 2020-12-28 DIAGNOSIS — N312 Flaccid neuropathic bladder, not elsewhere classified: Secondary | ICD-10-CM | POA: Diagnosis not present

## 2020-12-28 LAB — URINE CULTURE
MICRO NUMBER:: 12480934
SPECIMEN QUALITY:: ADEQUATE

## 2020-12-28 NOTE — Telephone Encounter (Signed)
Telephone call received from pts niece requesting recent lab results. This nurse was unable to locate HIPAA consent to discuss health information with [name]. This nurse requested to speak with pt to obtain consent but pt was unavailable. This nurse shared that results were available to pts my chart for convenience and if pt was able to give consent this nurse would gladly discuss results with niece.

## 2021-01-19 DIAGNOSIS — H532 Diplopia: Secondary | ICD-10-CM | POA: Diagnosis not present

## 2021-01-25 DIAGNOSIS — N312 Flaccid neuropathic bladder, not elsewhere classified: Secondary | ICD-10-CM | POA: Diagnosis not present

## 2021-02-02 ENCOUNTER — Inpatient Hospital Stay: Payer: Medicare Other | Attending: Hematology and Oncology

## 2021-02-02 ENCOUNTER — Other Ambulatory Visit: Payer: Self-pay

## 2021-02-02 ENCOUNTER — Inpatient Hospital Stay: Payer: Medicare Other

## 2021-02-02 VITALS — BP 177/99 | Temp 97.5°F | Resp 18

## 2021-02-02 DIAGNOSIS — C50411 Malignant neoplasm of upper-outer quadrant of right female breast: Secondary | ICD-10-CM | POA: Insufficient documentation

## 2021-02-02 DIAGNOSIS — M810 Age-related osteoporosis without current pathological fracture: Secondary | ICD-10-CM | POA: Insufficient documentation

## 2021-02-02 DIAGNOSIS — Z170 Estrogen receptor positive status [ER+]: Secondary | ICD-10-CM

## 2021-02-02 DIAGNOSIS — M818 Other osteoporosis without current pathological fracture: Secondary | ICD-10-CM

## 2021-02-02 LAB — CBC WITH DIFFERENTIAL/PLATELET
Abs Immature Granulocytes: 0.01 10*3/uL (ref 0.00–0.07)
Basophils Absolute: 0.1 10*3/uL (ref 0.0–0.1)
Basophils Relative: 1 %
Eosinophils Absolute: 0.2 10*3/uL (ref 0.0–0.5)
Eosinophils Relative: 4 %
HCT: 41.4 % (ref 36.0–46.0)
Hemoglobin: 13.2 g/dL (ref 12.0–15.0)
Immature Granulocytes: 0 %
Lymphocytes Relative: 18 %
Lymphs Abs: 1.1 10*3/uL (ref 0.7–4.0)
MCH: 29.1 pg (ref 26.0–34.0)
MCHC: 31.9 g/dL (ref 30.0–36.0)
MCV: 91.4 fL (ref 80.0–100.0)
Monocytes Absolute: 0.6 10*3/uL (ref 0.1–1.0)
Monocytes Relative: 9 %
Neutro Abs: 4.2 10*3/uL (ref 1.7–7.7)
Neutrophils Relative %: 68 %
Platelets: 215 10*3/uL (ref 150–400)
RBC: 4.53 MIL/uL (ref 3.87–5.11)
RDW: 14.6 % (ref 11.5–15.5)
WBC: 6.1 10*3/uL (ref 4.0–10.5)
nRBC: 0 % (ref 0.0–0.2)

## 2021-02-02 LAB — COMPREHENSIVE METABOLIC PANEL
ALT: 14 U/L (ref 0–44)
AST: 17 U/L (ref 15–41)
Albumin: 4.3 g/dL (ref 3.5–5.0)
Alkaline Phosphatase: 56 U/L (ref 38–126)
Anion gap: 10 (ref 5–15)
BUN: 17 mg/dL (ref 8–23)
CO2: 27 mmol/L (ref 22–32)
Calcium: 9.6 mg/dL (ref 8.9–10.3)
Chloride: 102 mmol/L (ref 98–111)
Creatinine, Ser: 0.79 mg/dL (ref 0.44–1.00)
GFR, Estimated: >60 mL/min (ref >60)
Glucose, Bld: 94 mg/dL (ref 70–99)
Potassium: 4 mmol/L (ref 3.5–5.1)
Sodium: 139 mmol/L (ref 135–145)
Total Bilirubin: 0.6 mg/dL (ref 0.3–1.2)
Total Protein: 7.6 g/dL (ref 6.5–8.1)

## 2021-02-02 LAB — VITAMIN D 25 HYDROXY (VIT D DEFICIENCY, FRACTURES): Vit D, 25-Hydroxy: 43.04 ng/mL (ref 30–100)

## 2021-02-02 MED ORDER — DENOSUMAB 60 MG/ML ~~LOC~~ SOSY
60.0000 mg | PREFILLED_SYRINGE | Freq: Once | SUBCUTANEOUS | Status: AC
Start: 2021-02-02 — End: 2021-02-02
  Administered 2021-02-02: 60 mg via SUBCUTANEOUS
  Filled 2021-02-02: qty 1

## 2021-02-08 DIAGNOSIS — M1712 Unilateral primary osteoarthritis, left knee: Secondary | ICD-10-CM | POA: Diagnosis not present

## 2021-02-15 DIAGNOSIS — M1712 Unilateral primary osteoarthritis, left knee: Secondary | ICD-10-CM | POA: Diagnosis not present

## 2021-02-22 DIAGNOSIS — M1712 Unilateral primary osteoarthritis, left knee: Secondary | ICD-10-CM | POA: Diagnosis not present

## 2021-02-23 DIAGNOSIS — N312 Flaccid neuropathic bladder, not elsewhere classified: Secondary | ICD-10-CM | POA: Diagnosis not present

## 2021-03-07 DIAGNOSIS — Z23 Encounter for immunization: Secondary | ICD-10-CM | POA: Diagnosis not present

## 2021-03-07 DIAGNOSIS — L739 Follicular disorder, unspecified: Secondary | ICD-10-CM | POA: Diagnosis not present

## 2021-03-07 DIAGNOSIS — L820 Inflamed seborrheic keratosis: Secondary | ICD-10-CM | POA: Diagnosis not present

## 2021-03-07 DIAGNOSIS — L570 Actinic keratosis: Secondary | ICD-10-CM | POA: Diagnosis not present

## 2021-03-23 DIAGNOSIS — N312 Flaccid neuropathic bladder, not elsewhere classified: Secondary | ICD-10-CM | POA: Diagnosis not present

## 2021-03-24 ENCOUNTER — Other Ambulatory Visit: Payer: Medicare Other

## 2021-03-28 ENCOUNTER — Encounter: Payer: Medicare Other | Admitting: Family Medicine

## 2021-03-28 ENCOUNTER — Other Ambulatory Visit: Payer: Medicare Other

## 2021-03-28 ENCOUNTER — Other Ambulatory Visit: Payer: Self-pay

## 2021-03-28 DIAGNOSIS — Z131 Encounter for screening for diabetes mellitus: Secondary | ICD-10-CM | POA: Diagnosis not present

## 2021-03-28 DIAGNOSIS — E782 Mixed hyperlipidemia: Secondary | ICD-10-CM

## 2021-03-28 LAB — LIPID PANEL
Chol/HDL Ratio: 2.3 ratio (ref 0.0–4.4)
Cholesterol, Total: 191 mg/dL (ref 100–199)
HDL: 82 mg/dL (ref >39)
LDL Chol Calc (NIH): 90 mg/dL (ref 0–99)
Triglycerides: 107 mg/dL (ref 0–149)
VLDL Cholesterol Cal: 19 mg/dL (ref 5–40)

## 2021-03-28 LAB — GLUCOSE, RANDOM: Glucose: 88 mg/dL (ref 70–99)

## 2021-04-03 NOTE — Progress Notes (Signed)
Chief Complaint  Patient presents with   Hypertension    Nonfasting (labs already done) med check. Will take pneumovax today. Was asking if it was possible to skip her Caltrate daily-it's so big and she takes so many other.     Patient presents for 6 month follow-up (should have been scheduled as her AWV). She had labs done prior to visit, see below.  Hypertension and palpitations. She continues to do well off digoxin, without recurrence of palpitations/tachycardia.   She is taking verapamil 326m, and is trying to limit the sodium in her diet. She hasn't been checking BP's at home.  Can't find her BP cuff (Home BP monitor was verified as accurate in 09/2019.) BP was very high when she had her Prolia injection through oncology office in November.  She reports it is often high when she is rushed or first gets there, and it was not rechecked. No headaches, dizziness, chest pain.  BP Readings from Last 3 Encounters:  04/04/21 134/84  02/02/21 (!) 177/99  12/25/20 (!) 130/98    Constipation:  She no longer gets the urge to defecate, but she has been going well.  In July she was not getting good results from Miralax, but only using a small amount (not measured by the capful), and had been using a ladies laxative 2x/week, in addition to taking stool softeners every night.   She was advised to increase to full cap of Miralax until results, then could titrate down if needed. She reports she did increase to almost a full cap daily, which works most of the time.  She still takes the stool softener daily.  She uses a suppository "to make your bowels move", can't recall the name, about 2x/week (in place of the other laxative).  She does report decreased appetite, maybe not getting enough fiber. She denies any blood in the stool or abdominal pain.   Hyperlipidemia follow-up: Patient is reportedly following a low cholesterol diet. Compliant with medications (atorvastatin 458m and denies medication side  effects.    Urinary retention--she has suprapubic catheter. She drains it about every 2-3 hours.  She goes to the urologist's office once a month to have the catheter changed, and sees Dr. EsJunious Silkearly (due this month). Denies pain, abnormal urine. Denies cloudy or bloody urine, no odor.  [facility]  She was treated with doxycycline.  Urine culture didn't show infection (mixed flora). Currently she denies urinary complaints.   Osteoporosis: She has been taking Prolia every 6 months through Dr. MaVirgie Dadffice since 09/2012. Last treatment was in 01/2021. She is taking calcium and vitamin D. Not getting any weight-bearing exercise. She only takes caltrate once daily due to constipation (was worse when taking BID).  It is very big and she is asking if she can stop taking this. She continues to eat a lot of dairy products (cottage cheese, yogurt.) She stopped the Centrum after her rash/allergic reaction. DEXA last done 04/2019.  T-3.2 at L radius (down from -3), -2.9 at R fem neck, -1.6 at L fem neck (down from -1.2). She is due for another DEXA next month (has been ordered by oncologist).   H/o breast cancer:  She still sees Dr. MaJana Hakimearly. She last saw him in 07/2020. She denies any breast concerns or complaints. Mammogram is UTD, due again next month.   Left knee pain--she is under the care of Dr. AlWynelle Link She had another course of 3 injections in 01/2021  with PA. She didn't find these to be helpful. She takes 2 extra strength tylenol every morning, and takes 1 more in the afternoon if/when she starts with pain later in the day.  Only occasionally takes advil (maybe 1-2 tablet/week or less often, needed some with the very cold weather last week). She has some bilateral hip pains just first thing in the morning.   Anxiety: infrequent, uses Valium very sparingly, most often needs related to travel. Last filled 12/30/19 for #30. She reports still having  plenty.   PMH, PSH, SH reviewed.  Outpatient Encounter Medications as of 04/04/2021  Medication Sig Note   acetaminophen (TYLENOL) 500 MG tablet Take 1,000 mg by mouth every 6 (six) hours as needed. 04/04/2021: Takes 2 every morning, 1 if needed later in the day   atorvastatin (LIPITOR) 40 MG tablet TAKE 1 TABLET(40 MG) BY MOUTH EVERY EVENING    Calcium Carbonate (CALTRATE 600 PO) Take 1 tablet by mouth daily.    Cholecalciferol (VITAMIN D3) 50 MCG (2000 UT) TABS Take 2,000 Units by mouth daily.    denosumab (PROLIA) 60 MG/ML SOLN injection Inject 60 mg into the skin See admin instructions. Administer in upper arm, thigh, or abdomen 12/13/2016: Every 6 months   Multiple Vitamins-Minerals (ICAPS AREDS 2) CAPS Take 1 capsule by mouth 2 (two) times daily.    polyethylene glycol (MIRALAX / GLYCOLAX) packet Take 17 g by mouth daily as needed for moderate constipation. 09/22/2020: Uses just a small amount daily   verapamil (VERELAN PM) 360 MG 24 hr capsule TAKE 1 CAPSULE(360 MG) BY MOUTH DAILY    clindamycin (CLEOCIN) 300 MG capsule clindamycin HCl 300 mg capsule  TAKE 2 CAPSULES BY MOUTH 1 HOUR PRIOR TO DENTAL APPOINTMENT (Patient not taking: No sig reported)    hydrocortisone (ANUSOL-HC) 2.5 % rectal cream APPLY EXTERNALLY TO THE AFFECTED AREA TWICE DAILY (Patient not taking: Reported on 09/17/2019)    loratadine (CLARITIN) 10 MG tablet Take 10 mg by mouth daily as needed for allergies.  (Patient not taking: Reported on 10/04/2020)    Multiple Vitamins-Minerals (CENTRUM ADULTS PO) Take 1 tablet by mouth daily. (Patient not taking: Reported on 09/22/2020) 09/22/2020: Stopped taking   VALIUM 2 MG tablet TAKE 1 TABLET BY MOUTH EVERY 8 HOURS FOR MUSCLE SPASMS (Patient not taking: Reported on 04/08/2020) 09/22/2020: Prn very rarely   No facility-administered encounter medications on file as of 04/04/2021.   Allergies  Allergen Reactions   Azithromycin Nausea Only   Arimidex [Anastrozole] Rash   Avelox  [Moxifloxacin Hcl In Nacl] Nausea Only and Rash   Bactrim Nausea Only and Rash   Biaxin [Clarithromycin] Nausea Only and Rash   Macrobid [Nitrofurantoin] Nausea Only and Rash   Penicillins Nausea Only and Rash    Has patient had a PCN reaction causing immediate rash, facial/tongue/throat swelling, SOB or lightheadedness with hypotension: no Has patient had a PCN reaction causing severe rash involving mucus membranes or skin necrosis: no Has patient had a PCN reaction that required hospitalization no Has patient had a PCN reaction occurring within the last 10 years no If all of the above answers are "NO", then may proceed with Cephalosporin use.    Sulfa Antibiotics Nausea And Vomiting and Rash   ROS:  No headaches, dizziness, URI symptoms, chest pain, palpitations, shortness of breath, nausea, vomiting, bowel changes, still with chronic constipation, per HPI. No urinary complaints. Knee pain per HPI, bilateral, L worse than R Moods are good. Family recently visited, and more are  coming soon.   PHYSICAL EXAM:  BP 134/84    Pulse 80    Ht _0  (1.6 m)    Wt 121 lb 3.2 oz (55 kg)    LMP  (LMP Unknown)    BMI 21.47 kg/m   Wt Readings from Last 3 Encounters:  04/04/21 121 lb 3.2 oz (55 kg)  12/25/20 120 lb (54.4 kg)  10/04/20 120 lb 3.2 oz (54.5 kg)   Pleasant, elderly female, in no distress. HEENT: conjunctiva and sclera are clear, EOMI. Fundi benign. TM's and EACs are normal. OP is notable for torus pallatini of upper palate, otherwise clear Neck: no lymphadenopathy, thyromegaly or mass, no carotid bruit. Heart: Regular rate and rhythm Lungs: clear bilaterally Back: no spinal or CVA tenderness Abdomen: soft, nontender, no mass, no organomegaly or mass Extremities: no edema, 2+ pulses Skin: normal turgor, no rash Neuro: alert and oriented, normal gait. Cranial nerves intact, DTR's symmetric. Psych: normal mood, affect, hygiene and grooming. Normal speech, eye contact.  Patient  declines breast/pelvic exam to be done here. She sees dermatologist regularly.  Lab Results  Component Value Date   CHOL 191 03/28/2021   HDL 82 03/28/2021   LDLCALC 90 03/28/2021   TRIG 107 03/28/2021   CHOLHDL 2.3 03/28/2021   Fasting glucose 88  Other labs reviewed: 01/2021 from oncology--Vitamin D level 43, normal CBC, c-met   ASSESSMENT/PLAN:  Essential hypertension, benign - BP controlled on verapamil 381m. Cont low sodium diet. Discussed some in-home exercises  - Plan: verapamil (VERELAN PM) 360 MG 24 hr capsule  Mixed hyperlipidemia - well controlled on lipitor  Presence of [place]rapubic catheter ([place] - due for yearly f/u with urologist  Osteoporosis without current pathological fracture, unspecified osteoporosis type - Due for DEXA next month. Cont prolia, Ca, D. Wt bearing exercise recommended. Ca options reviewed  Constipation, unspecified constipation type  Need for pneumococcal vaccination - Plan: Pneumococcal polysaccharide vaccine 23-valent greater than or equal to 2yo subcutaneous/IM  Palpitations - well controlled on current regimen - Plan: verapamil (VERELAN PM) 360 MG 24 hr capsule  Discussed calcium options--suggested switching to calcium citrate (might not cause as much constipation, but likely also a larger tablet, and may need to take 2, to read label). Discussed Viactiv, gummy vitamins, or Tums as options that aren't large pills. She made a face, suggesting these other options don't really appeal to her. We discussed the need for 1200-15066mof calcium daily (total, from all sources), and info on calcium content in foods given. She likely does need to take additional calcium, as her portions of dairy are likely small.  Pneumovax booster given today. Tdap is also due this year, needs to come from pharmacy. Advised to get in 2 weeks (or later).  It was too soon for her AWV today.  Discussed options of returning after 1/20, vs doing her AWV in place of her  6 month f/u in July. She will have family visiting in July, prefers to do earlier.  Will schedule in March or April.  [facility]

## 2021-04-04 ENCOUNTER — Encounter: Payer: Self-pay | Admitting: Family Medicine

## 2021-04-04 ENCOUNTER — Ambulatory Visit (INDEPENDENT_AMBULATORY_CARE_PROVIDER_SITE_OTHER): Payer: Medicare Other | Admitting: Family Medicine

## 2021-04-04 ENCOUNTER — Other Ambulatory Visit: Payer: Self-pay

## 2021-04-04 VITALS — BP 134/84 | HR 80 | Ht 63.0 in | Wt 121.2 lb

## 2021-04-04 DIAGNOSIS — K5900 Constipation, unspecified: Secondary | ICD-10-CM

## 2021-04-04 DIAGNOSIS — M810 Age-related osteoporosis without current pathological fracture: Secondary | ICD-10-CM | POA: Diagnosis not present

## 2021-04-04 DIAGNOSIS — R002 Palpitations: Secondary | ICD-10-CM | POA: Diagnosis not present

## 2021-04-04 DIAGNOSIS — Z23 Encounter for immunization: Secondary | ICD-10-CM

## 2021-04-04 DIAGNOSIS — E782 Mixed hyperlipidemia: Secondary | ICD-10-CM

## 2021-04-04 DIAGNOSIS — Z9359 Other cystostomy status: Secondary | ICD-10-CM

## 2021-04-04 DIAGNOSIS — I10 Essential (primary) hypertension: Secondary | ICD-10-CM

## 2021-04-04 MED ORDER — VERAPAMIL HCL ER 360 MG PO CP24: ORAL_CAPSULE | ORAL | 1 refills | Status: DC

## 2021-04-04 NOTE — Patient Instructions (Addendum)
Try and get more fiber in your diet.  Be sure to drink enough water.  Getting regular movement/exercise also helps keeps the bowel moving.  You can try switching your Caltrate to Citrical (calcium citrate) and see if this is less constipating. If it is also too big to take (read the label to see if you're supposed to take 1 or 2 at a time, I think one "serving" may be 2 pills).  You can try taking a gummy vitamin if the pills are still too big. Mary [name] is another option (chocolate/caramel)--take after lunch. Tums is also a chewable calcium source (but doesn't have the vitamin D).  See the handout that discussed calcium in your foods.  The goal is to take 1200-1500 mg of calcium daily from all sources, including your vitamins and your diet.  You are due for a tetanus booster this year (TdaP). Last was 08/2011, and they are recommended every 10 years. You need to get this from the pharmacy. It needs to be separated from other vaccines by at least 2 weeks.  Please call to schedule your follow-up appointment with Dr. [name] at [facility].  Try Voltaren gel for your knees. This is safer than Advil.

## 2021-04-12 DIAGNOSIS — H6123 Impacted cerumen, bilateral: Secondary | ICD-10-CM | POA: Diagnosis not present

## 2021-04-12 DIAGNOSIS — H9113 Presbycusis, bilateral: Secondary | ICD-10-CM | POA: Insufficient documentation

## 2021-04-19 DIAGNOSIS — N312 Flaccid neuropathic bladder, not elsewhere classified: Secondary | ICD-10-CM | POA: Diagnosis not present

## 2021-05-12 DIAGNOSIS — N210 Calculus in bladder: Secondary | ICD-10-CM | POA: Diagnosis not present

## 2021-05-12 DIAGNOSIS — N312 Flaccid neuropathic bladder, not elsewhere classified: Secondary | ICD-10-CM | POA: Diagnosis not present

## 2021-05-31 DIAGNOSIS — M25551 Pain in right hip: Secondary | ICD-10-CM | POA: Diagnosis not present

## 2021-06-08 DIAGNOSIS — M25551 Pain in right hip: Secondary | ICD-10-CM | POA: Diagnosis not present

## 2021-06-10 DIAGNOSIS — N312 Flaccid neuropathic bladder, not elsewhere classified: Secondary | ICD-10-CM | POA: Diagnosis not present

## 2021-06-14 ENCOUNTER — Telehealth: Payer: Self-pay

## 2021-06-14 MED ORDER — VALIUM 2 MG PO TABS: ORAL_TABLET | ORAL | 0 refills | Status: DC

## 2021-06-14 NOTE — Telephone Encounter (Signed)
Please advise pt refill was sent. ?

## 2021-06-14 NOTE — Telephone Encounter (Signed)
Pt. Aware.

## 2021-06-14 NOTE — Telephone Encounter (Signed)
Pt. Called needs a refill on her Valium 2 mg only has 2 left and they are pretty old. Wants them sent to [facility] on [address].  ?

## 2021-06-30 DIAGNOSIS — M1611 Unilateral primary osteoarthritis, right hip: Secondary | ICD-10-CM | POA: Diagnosis not present

## 2021-06-30 DIAGNOSIS — M25551 Pain in right hip: Secondary | ICD-10-CM | POA: Diagnosis not present

## 2021-07-08 DIAGNOSIS — N312 Flaccid neuropathic bladder, not elsewhere classified: Secondary | ICD-10-CM | POA: Diagnosis not present

## 2021-07-17 NOTE — Patient Instructions (Addendum)
?  Mary [name] , ?Thank you for taking time to come for your Medicare Wellness Visit. I appreciate your ongoing commitment to your health goals. Please review the following plan we discussed and let me know if I can assist you in the future.  ? ?This is a list of the screening recommended for you and due dates:  ?Health Maintenance  ?Topic Date Due  ? Zoster (Shingles) Vaccine (1 of 2) Never done  ? Tetanus Vaccine  09/03/2021  ? Flu Shot  10/18/2021  ? Pneumonia Vaccine  Completed  ? DEXA scan (bone density measurement)  Completed  ? COVID-19 Vaccine  Completed  ? HPV Vaccine  Aged Out  ? ?We discussed that you are now eligible to get a second bivalent COVID booster (the same shot that you got back in October).   Schedule a nurse visit at our office if you decide you want this booster. ?There likely will be a new and improved COVID booster coming out in the Fall, that you should get, along with your yearly flu shot. ? ?I also recommend getting the new shingles vaccine (Shingrix). Since you have Medicare, you will need to get this from the pharmacy, as it is covered by Part D. There is no longer an out of pocket copay (if you have Part D) ?This is a series of 2 injections, spaced 2 months apart.   ?This should be separated from other vaccines by at least 2 weeks. ? ?You are due for a follow-up bone density test (that Dr. Virgie Dad office have ordered for you). Please [facility] to schedule these studies. You likely will discuss this with Dr. Lindi Adie at your upcoming appointment. ? ?Please bring [place] copies of your Living Will and [place] once completed and notarized so that it can be scanned into your medical chart. ? ?Check with the oncology office (Dr. Lindi Adie) for your appointment times on 5/17. ? ?I encourage you to do a TRIAL of stopping the extra strength Tylenol, and switching to Tylenol Arthritis. This lasts longer, so won't wear off as quickly. ?Taking 2 tablets every 8 hours  (max dose of 6 pills/day) will work throughout the day. ?If you find this doesn't work as well, you can go back to the extra strength Tylenol as you have been taking it. ?Only take advil if you have already taken tylenol, and you are still in a lot of pain. Always take this with food, and use it very rarely. ? ?You can consider trying topical Voltaren Gel or SalonPas with lidocaine as needed when you are having hip pain. ? ?We briefly talked about using [facility] services to help you Patent attorney). ?They could help assess any needs you may have at the home, home safety evaluation to reduce fall risks, things like safety bracelets, necklaces, etc (since you aren't using your AppleWatch). ? ?I would follow-up with Dr. Janeice Robinson office regarding your hearing. ? ? ? ? ?

## 2021-07-17 NOTE — Progress Notes (Signed)
?Chief Complaint  ?Patient presents with  ? Medicare Wellness  ?  Nonfasting AWV,will come back tomorrow for labs but states her arthritis is worst in the am and is concerned whether or not she can take her tylenol prior to coming in the am. No new concerns.  Needs refill on rectal cream.  ? ? ?Mary [name] is a 88 y.o. female who presents for annual wellness visit.  She had med check (and labs) in 03/2021. She is accompanied by her niece Regulatory affairs officer. ? ?Osteoporosis: She has been taking Prolia through Dr. Virgie Dad office since 09/2012. Last treatment was in 01/2021. She is past due for f/u DEXA (ordered by oncologist). ?She only takes caltrate once daily due to constipation (was worse when taking BID).  She has cottage cheese, yogurt and milk daily.  ?Discussed the recs for Ca, D and weight-bearing exercise at Jan visit. ?She is scheduled to see Dr. Lindi Adie later in May (due for Prolia). ? ?H/o breast cancer:  She had been seeing Dr. [name] yearly, recently retired. She has an upcoming appointment with Dr. Lindi Adie.  She denies any breast concerns or complaints. She no longer wants to have mammograms (has had them yearly up until now). ?  ?Left knee pain/arthritis--s/p injections in the left knee, under the care of Dr. Wynelle Link. Previous injections were helpful. She currently denies any significant left knee pain. ?She also has progressive R hip arthritis. She had short-term benefit from injection done 05/2021, eligible again end of June. Her pain is in the R groin. She states she has had 2 R hip injections so far, the first wasn't helpful, but the second one was. ? ?She is taking Tylenol--she takes 2 extra strength Tylenol in the morning, and 2 additional doses throughout the day, 4 hours apart ('3000mg'$  total daily). ?She denies having pain when she gets up and goes to the bathroom at night. ?She reports having a lot of pain first thing in the morning, to the point that her daughter almost took her to the ER when  she was visiting. ?Dr. Wynelle Link had told her that she can take 1 advil a day if needed for breakthrough pain, has only needed it once. She had taken that in the middle of the night, and made sure to eat something with it. ? ?They report that he has ordered PT, not yet scheduled. (Not in-home) ? ?[name] shows pictures on her phone of the front steps at her home.  Apparently the top step is a little taller, and she puts a glove on the floor to help her get up (?more traction, vs for more height to help with getting up??) She wonders if she would do better with a ramp. ? ?Immunization History  ?Administered Date(s) Administered  ? Fluad Quad(high Dose 65+) 11/27/2018, 12/23/2020  ? Influenza Split 12/01/2010, 12/18/2012, 01/02/2015, 01/02/2015  ? Influenza, High Dose Seasonal PF 12/13/2016, 12/24/2017, 12/06/2019  ? Influenza-Unspecified 12/08/2013  ? PFIZER(Purple Top)SARS-COV-2 Vaccination 04/24/2019, 05/15/2019, 12/17/2019  ? Pension scheme manager 45yr & up 12/23/2020  ? Pneumococcal Conjugate-13 04/15/2014  ? Pneumococcal Polysaccharide-23 12/19/2002, 09/04/2011, 04/04/2021  ? Td 11/19/2003  ? Tdap 09/04/2011, 04/18/2021  ? ?Last Pap smear: 2013 ?Last mammogram: 04/2020 ?Last colonoscopy: 2013, told no further was needed ?Last DEXA: 04/2019 T-3.2 L radius, -2.9 R fem neck, -1.6 L fem neck ?Dentist: twice yearly ?Ophtho: yearly. Dr. TSatira Sarkrecently referred her to Dr. PPosey Pronto(sees double unless wearing glasses, with prism). ?Exercise:  minimal. No weight-bearing exercise ? ?  Vitamin D screen normal at 43.04 in 01/2021 ?  ?  ?Patient Care Team: ?Rita Ohara, MD as PCP - General (Family Medicine) ?Magrinat, Virgie Dad, MD (Inactive) as Consulting Physician (Oncology) ?Streck, Cruzita Lederer, MD as Consulting Physician (Pediatric Surgery) ?[name], MD (Inactive) as Consulting Physician (Urology) ?[name], MD as Consulting Physician (Orthopedic Surgery) ?Dr. Diamantina Monks ?Dr.  Eskridge--urology ?Dr. Olga Coaster (just prn, for cerumen removal) ?Dr. Lutricia Horsfall ?Dr. Belinda Block ?Dr. Dorothe Pea ? ?Depression Screening: ?[place] Office Visit from 07/18/2021 in [place]  ?PHQ-2 Total Score 0  ? ?  ?  ?Falls screen:  ? ?  07/18/2021  ?  2:57 PM 04/04/2021  ? 10:39 AM 09/22/2020  ?  2:48 PM 04/08/2020  ? 10:08 AM 10/01/2019  ? 10:58 AM  ?Fall Risk   ?Falls in the past year? 1 1 0 1 0  ?Number falls in past yr: 0 0 0 0   ?Comment fell picking up piece of puzzle-now has a grabber      ?Injury with Fall? 0 0 0 0   ?Comment  last week-bruised right knee. Reached down to get a puzzle piece and fell  only hit her head, no injury   ?Risk for fall due to : History of fall(s) No Fall Risks No Fall Risks    ?Follow up Falls evaluation completed Falls evaluation completed Falls evaluation completed    ?  ? ?Functional Status Survey: ?Is the patient deaf or have difficulty hearing?: Yes ?Does the patient have difficulty seeing, even when wearing glasses/contacts?: Yes ?Does the patient have difficulty concentrating, remembering, or making decisions?: No ?Does the patient have difficulty walking or climbing stairs?: Yes (trouble going up the stairs) ?Does the patient have difficulty dressing or bathing?: No ?Does the patient have difficulty doing errands alone such as visiting a doctor's office or shopping?: No ? ?Mini-Cog Scoring: 3 she only got 1/3 words for the nurse.  Niece there with her, more talking/distractions than usual.  I gave her 3 different words later in the visit, and she recalled all 3. ? ?Functional Status survey:  Trouble with stairs due to knee pain. Vision issues per HPI (doesn't see double when wearing glasses).  Has decline in hearing and would like hearing eval for hearing aids. ? ?  ?End of Life Discussion:  Patient has a living will and medical power of attorney. We do not have copies. ? ?  ?PMH, PSH, SH reviewed and updated ? ?Outpatient Encounter Medications as  of 07/18/2021  ?Medication Sig Note  ? acetaminophen (TYLENOL) 500 MG tablet Take 1,000 mg by mouth every 6 (six) hours as needed. 07/18/2021: Takes 2 TID, 9am, 1pm and 6pm.  ? atorvastatin (LIPITOR) 40 MG tablet TAKE 1 TABLET(40 MG) BY MOUTH EVERY EVENING   ? Calcium Carbonate (CALTRATE 600 PO) Take 1 tablet by mouth daily.   ? Cholecalciferol (VITAMIN D3) 50 MCG (2000 UT) TABS Take 2,000 Units by mouth daily.   ? denosumab (PROLIA) 60 MG/ML SOLN injection Inject 60 mg into the skin See admin instructions. Administer in upper arm, thigh, or abdomen 12/13/2016: Every 6 months  ? hydrocortisone (ANUSOL-HC) 2.5 % rectal cream Place 1 application. rectally 2 (two) times daily.   ? ibuprofen (ADVIL) 200 MG tablet Take 200 mg by mouth every 6 (six) hours as needed. 07/18/2021: Takes about once a week  ? Multiple Vitamins-Minerals (ICAPS AREDS 2) CAPS Take 1 capsule by mouth 2 (two) times daily.   ? polyethylene glycol (MIRALAX /  GLYCOLAX) packet Take 17 g by mouth daily as needed for moderate constipation. 09/22/2020: Uses just a small amount daily  ? verapamil (VERELAN PM) 360 MG 24 hr capsule TAKE 1 CAPSULE(360 MG) BY MOUTH DAILY   ? clindamycin (CLEOCIN) 300 MG capsule clindamycin HCl 300 mg capsule ? TAKE 2 CAPSULES BY MOUTH 1 HOUR PRIOR TO DENTAL APPOINTMENT (Patient not taking: No sig reported)   ? loratadine (CLARITIN) 10 MG tablet Take 10 mg by mouth daily as needed for allergies.  (Patient not taking: Reported on 10/04/2020)   ? Multiple Vitamins-Minerals (CENTRUM ADULTS PO) Take 1 tablet by mouth daily. (Patient not taking: Reported on 09/22/2020) 09/22/2020: Stopped taking  ? VALIUM 2 MG tablet TAKE 1 TABLET BY MOUTH EVERY 8 HOURS FOR MUSCLE SPASMS (Patient not taking: Reported on 07/18/2021)   ? [DISCONTINUED] hydrocortisone (ANUSOL-HC) 2.5 % rectal cream APPLY EXTERNALLY TO THE AFFECTED AREA TWICE DAILY (Patient not taking: Reported on 09/17/2019)   ? ?No facility-administered encounter medications on file as of 07/18/2021.   ? ?Allergies  ?Allergen Reactions  ? Azithromycin Nausea Only  ? Arimidex [Anastrozole] Rash  ? Avelox [Moxifloxacin Hcl In Nacl] Nausea Only and Rash  ? Bactrim Nausea Only and Rash  ? Biaxin [Clarithromycin] Nause

## 2021-07-18 ENCOUNTER — Encounter: Payer: Self-pay | Admitting: Family Medicine

## 2021-07-18 ENCOUNTER — Ambulatory Visit (INDEPENDENT_AMBULATORY_CARE_PROVIDER_SITE_OTHER): Payer: Medicare Other | Admitting: Family Medicine

## 2021-07-18 VITALS — BP 116/68 | HR 72 | Ht 62.0 in | Wt 121.4 lb

## 2021-07-18 DIAGNOSIS — M810 Age-related osteoporosis without current pathological fracture: Secondary | ICD-10-CM

## 2021-07-18 DIAGNOSIS — Z0000 Encounter for general adult medical examination without abnormal findings: Secondary | ICD-10-CM | POA: Diagnosis not present

## 2021-07-18 DIAGNOSIS — M1611 Unilateral primary osteoarthritis, right hip: Secondary | ICD-10-CM | POA: Diagnosis not present

## 2021-07-18 DIAGNOSIS — Z7185 Encounter for immunization safety counseling: Secondary | ICD-10-CM | POA: Diagnosis not present

## 2021-07-18 MED ORDER — HYDROCORTISONE (PERIANAL) 2.5 % EX CREA: 1.0000 "application " | TOPICAL_CREAM | Freq: Two times a day (BID) | CUTANEOUS | 1 refills | Status: DC

## 2021-07-20 NOTE — Progress Notes (Signed)
? ?Patient Care Team: ?Rita Ohara, MD as PCP - General (Family Medicine) ?Magrinat, Virgie Dad, MD (Inactive) as Consulting Physician (Oncology) ?Streck, Cruzita Lederer, MD as Consulting Physician (Pediatric Surgery) ?[name], MD (Inactive) as Consulting Physician (Urology) ?[name], MD as Consulting Physician (Orthopedic Surgery) ? ?DIAGNOSIS:  ?Encounter Diagnosis  ?Name Primary?  ? Malignant neoplasm of upper-outer quadrant of right breast in female, estrogen receptor positive ([place])   ? ?  ? ?CHIEF COMPLIANT: HER-2 positive breast cancer (s/p right mastectomy) ? ?INTERVAL HISTORY: Mary [name] is a 88 y.o. with the above mention HER-2 positive breast cancer (s/p right mastectomy). She presents to the clinic today for a follow-up. She state the aches is mostly in the groin and its painful when she walks.  She did not get a mammogram and does not want to do any further mammograms at this time. ? ? ?ALLERGIES:  is allergic to avelox [moxifloxacin], azithromycin, sulfur, arimidex [anastrozole], avelox [moxifloxacin hcl in nacl], bactrim, biaxin [clarithromycin], nitrofurantoin, penicillins, and sulfa antibiotics. ? ?MEDICATIONS:  ?Current Outpatient Medications  ?Medication Sig Dispense Refill  ? acetaminophen (TYLENOL) 500 MG tablet Take 1,000 mg by mouth every 6 (six) hours as needed.    ? atorvastatin (LIPITOR) 40 MG tablet TAKE 1 TABLET(40 MG) BY MOUTH EVERY EVENING 90 tablet 1  ? Calcium Carbonate (CALTRATE 600 PO) Take 1 tablet by mouth daily.    ? Cholecalciferol (VITAMIN D3) 50 MCG (2000 UT) TABS Take 2,000 Units by mouth daily. 100 tablet 4  ? clindamycin (CLEOCIN) 300 MG capsule clindamycin HCl 300 mg capsule ? TAKE 2 CAPSULES BY MOUTH 1 HOUR PRIOR TO DENTAL APPOINTMENT (Patient not taking: No sig reported)    ? denosumab (PROLIA) 60 MG/ML SOLN injection Inject 60 mg into the skin See admin instructions. Administer in upper arm, thigh, or abdomen    ? hydrocortisone (ANUSOL-HC) 2.5 % rectal  cream Place 1 application. rectally 2 (two) times daily. 30 g 1  ? ibuprofen (ADVIL) 200 MG tablet Take 200 mg by mouth every 6 (six) hours as needed.    ? loratadine (CLARITIN) 10 MG tablet Take 10 mg by mouth daily as needed for allergies.  (Patient not taking: Reported on 10/04/2020)    ? Multiple Vitamins-Minerals (CENTRUM ADULTS PO) Take 1 tablet by mouth daily. (Patient not taking: Reported on 09/22/2020)    ? Multiple Vitamins-Minerals (ICAPS AREDS 2) CAPS Take 1 capsule by mouth 2 (two) times daily.    ? polyethylene glycol (MIRALAX / GLYCOLAX) packet Take 17 g by mouth daily as needed for moderate constipation.    ? VALIUM 2 MG tablet TAKE 1 TABLET BY MOUTH EVERY 8 HOURS FOR MUSCLE SPASMS (Patient not taking: Reported on 07/18/2021) 30 tablet 0  ? verapamil (VERELAN PM) 360 MG 24 hr capsule TAKE 1 CAPSULE(360 MG) BY MOUTH DAILY 90 capsule 1  ? ?No current facility-administered medications for this visit.  ? ? ?PHYSICAL EXAMINATION: ?ECOG PERFORMANCE STATUS: 1 - Symptomatic but completely ambulatory ? ?Vitals:  ? 08/03/21 1120  ?BP: (!) 177/87  ?Pulse: 83  ?Resp: 18  ?Temp: (!) 97.5 ?F (36.4 ?C)  ?SpO2: 96%  ? ?Filed Weights  ? 08/03/21 1120  ?Weight: 120 lb 4.8 oz (54.6 kg)  ? ? ?BREAST: No palpable masses or nodules in either right chest wall or left breasts. No palpable axillary supraclavicular or infraclavicular adenopathy no breast tenderness or nipple discharge. (exam performed in the presence of a chaperone) ? ?LABORATORY DATA:  ?I have reviewed the  data as listed ? ?  Latest Ref Rng & Units 08/03/2021  ? 10:41 AM 03/28/2021  ?  9:42 AM 02/02/2021  ? 10:57 AM  ?CMP  ?Glucose 70 - 99 mg/dL 86   88   94    ?BUN 8 - 23 mg/dL 15    17    ?Creatinine 0.44 - 1.00 mg/dL 0.76    0.79    ?Sodium 135 - 145 mmol/L 139    139    ?Potassium 3.5 - 5.1 mmol/L 3.9    4.0    ?Chloride 98 - 111 mmol/L 100    102    ?CO2 22 - 32 mmol/L 34    27    ?Calcium 8.9 - 10.3 mg/dL 9.8    9.6    ?Total Protein 6.5 - 8.1 g/dL 7.2    7.6     ?Total Bilirubin 0.3 - 1.2 mg/dL 0.6    0.6    ?Alkaline Phos 38 - 126 U/L 83    56    ?AST 15 - 41 U/L 14    17    ?ALT 0 - 44 U/L 12    14    ? ? ?Lab Results  ?Component Value Date  ? WBC 5.9 08/03/2021  ? HGB 12.9 08/03/2021  ? HCT 39.5 08/03/2021  ? MCV 90.2 08/03/2021  ? PLT 255 08/03/2021  ? NEUTROABS 4.2 08/03/2021  ? ? ?ASSESSMENT & PLAN:  ?Malignant neoplasm of upper-outer quadrant of right breast in female, estrogen receptor positive ([place]) ?01/23/2006: Stage II AT2N0 2.5 cm, grade 2 IDC ER 100%, PR 3%, Ki-67 18%, HER2 positive 3+, 0/1 lymph node ?02/22/2006: Oncotype DX score 15: 10% ROR ?02/28/2006: Right mastectomy residual IDC stage II AT2N0 ?January 2008: Anastrozole started.  Discontinued April 2008 (hives) switched to letrozole in April 2008 completed December 2012 ?Osteoporosis: T score -3.2: On Prolia every 6 months ?Genetics negative (Ashkenazi Jewish descent) ? ?Breast cancer surveillance: ?1.  Breast exam 08/03/2021: Benign ?2. left mammogram February 2022: Benign ?She does not want to do any further mammograms. ? ?Osteoporosis: T score -3.2 in 2021.  We discussed the pros and cons of doing a bone density test and decided not to do it. ?We also discussed the pros and cons of continuing with the Prolia injections.  She would like to continue with this treatment and so we will see her every 6 months with labs. ? ?Return to clinic in 1 year for follow-up ? ? ? ?No orders of the defined types were placed in this encounter. ? ?The patient has a good understanding of the overall plan. she agrees with it. she will call with any problems that may develop before the next visit here. ?Total time spent: 30 mins including face to face time and time spent for planning, charting and co-ordination of care ? ? Harriette Ohara, MD ?08/03/21 ? ? ? I Gardiner Coins am scribing for Dr. Lindi Adie ? ?I have reviewed the above documentation for accuracy and completeness, and I agree with the above. ?  ?

## 2021-07-27 DIAGNOSIS — H5032 Intermittent alternating esotropia: Secondary | ICD-10-CM | POA: Diagnosis not present

## 2021-07-27 DIAGNOSIS — Z961 Presence of intraocular lens: Secondary | ICD-10-CM | POA: Diagnosis not present

## 2021-08-02 ENCOUNTER — Other Ambulatory Visit: Payer: Self-pay | Admitting: *Deleted

## 2021-08-02 DIAGNOSIS — C50411 Malignant neoplasm of upper-outer quadrant of right female breast: Secondary | ICD-10-CM

## 2021-08-03 ENCOUNTER — Inpatient Hospital Stay (HOSPITAL_BASED_OUTPATIENT_CLINIC_OR_DEPARTMENT_OTHER): Payer: Medicare Other | Admitting: Hematology and Oncology

## 2021-08-03 ENCOUNTER — Inpatient Hospital Stay: Payer: Medicare Other | Attending: Hematology and Oncology

## 2021-08-03 ENCOUNTER — Other Ambulatory Visit: Payer: Self-pay

## 2021-08-03 ENCOUNTER — Inpatient Hospital Stay: Payer: Medicare Other

## 2021-08-03 DIAGNOSIS — C50411 Malignant neoplasm of upper-outer quadrant of right female breast: Secondary | ICD-10-CM | POA: Diagnosis not present

## 2021-08-03 DIAGNOSIS — Z170 Estrogen receptor positive status [ER+]: Secondary | ICD-10-CM | POA: Diagnosis not present

## 2021-08-03 DIAGNOSIS — M818 Other osteoporosis without current pathological fracture: Secondary | ICD-10-CM

## 2021-08-03 DIAGNOSIS — M810 Age-related osteoporosis without current pathological fracture: Secondary | ICD-10-CM | POA: Insufficient documentation

## 2021-08-03 LAB — CMP (CANCER CENTER ONLY)
ALT: 12 U/L (ref 0–44)
AST: 14 U/L — ABNORMAL LOW (ref 15–41)
Albumin: 4.1 g/dL (ref 3.5–5.0)
Alkaline Phosphatase: 83 U/L (ref 38–126)
Anion gap: 5 (ref 5–15)
BUN: 15 mg/dL (ref 8–23)
CO2: 34 mmol/L — ABNORMAL HIGH (ref 22–32)
Calcium: 9.8 mg/dL (ref 8.9–10.3)
Chloride: 100 mmol/L (ref 98–111)
Creatinine: 0.76 mg/dL (ref 0.44–1.00)
GFR, Estimated: >60 mL/min (ref >60)
Glucose, Bld: 86 mg/dL (ref 70–99)
Potassium: 3.9 mmol/L (ref 3.5–5.1)
Sodium: 139 mmol/L (ref 135–145)
Total Bilirubin: 0.6 mg/dL (ref 0.3–1.2)
Total Protein: 7.2 g/dL (ref 6.5–8.1)

## 2021-08-03 LAB — CBC WITH DIFFERENTIAL (CANCER CENTER ONLY)
Abs Immature Granulocytes: 0.02 10*3/uL (ref 0.00–0.07)
Basophils Absolute: 0.1 10*3/uL (ref 0.0–0.1)
Basophils Relative: 1 %
Eosinophils Absolute: 0.2 10*3/uL (ref 0.0–0.5)
Eosinophils Relative: 4 %
HCT: 39.5 % (ref 36.0–46.0)
Hemoglobin: 12.9 g/dL (ref 12.0–15.0)
Immature Granulocytes: 0 %
Lymphocytes Relative: 12 %
Lymphs Abs: 0.7 10*3/uL (ref 0.7–4.0)
MCH: 29.5 pg (ref 26.0–34.0)
MCHC: 32.7 g/dL (ref 30.0–36.0)
MCV: 90.2 fL (ref 80.0–100.0)
Monocytes Absolute: 0.7 10*3/uL (ref 0.1–1.0)
Monocytes Relative: 11 %
Neutro Abs: 4.2 10*3/uL (ref 1.7–7.7)
Neutrophils Relative %: 72 %
Platelet Count: 255 10*3/uL (ref 150–400)
RBC: 4.38 MIL/uL (ref 3.87–5.11)
RDW: 15.2 % (ref 11.5–15.5)
WBC Count: 5.9 10*3/uL (ref 4.0–10.5)
nRBC: 0 % (ref 0.0–0.2)

## 2021-08-03 MED ORDER — DENOSUMAB 60 MG/ML ~~LOC~~ SOSY
60.0000 mg | PREFILLED_SYRINGE | Freq: Once | SUBCUTANEOUS | Status: AC
  Administered 2021-08-03: 60 mg via SUBCUTANEOUS
  Filled 2021-08-03: qty 1

## 2021-08-03 NOTE — Assessment & Plan Note (Addendum)
01/23/2006: Stage II AT2N0 2.5 cm, grade 2 IDC ER 100%, PR 3%, Ki-67 18%, HER2 positive 3+, 0/1 lymph node ?02/22/2006: Oncotype DX score 15: 10% ROR ?02/28/2006: Right mastectomy residual IDC stage II AT2N0 ?January 2008: Anastrozole started.  Discontinued April 2008 (hives) switched to letrozole in April 2008 completed December 2012 ?Osteoporosis: T score -3.2: On Prolia every 6 months ?Genetics negative (Ashkenazi Jewish descent) ? ?Breast cancer surveillance: ?1.  Breast exam 08/03/2021: Benign ?2. left mammogram February 2022: Benign ?She does not want to do any further mammograms. ? ?Osteoporosis: T score -3.2 in 2021.  We discussed the pros and cons of doing a bone density test and decided not to do it. ?We also discussed the pros and cons of continuing with the Prolia injections.  She would like to continue with this treatment and so we will see her every 6 months with labs. ? ?Return to clinic in 1 year for follow-up ?

## 2021-08-03 NOTE — Patient Instructions (Signed)
Denosumab injection ?What is this medication? ?DENOSUMAB (den oh sue mab) slows bone breakdown. Prolia is used to treat osteoporosis in women after menopause and in men, and in people who are taking corticosteroids for 6 months or more. Xgeva is used to treat a high calcium level due to cancer and to prevent bone fractures and other bone problems caused by multiple myeloma or cancer bone metastases. Xgeva is also used to treat giant cell tumor of the bone. ?This medicine may be used for other purposes; ask your health care provider or pharmacist if you have questions. ?COMMON BRAND NAME(S): Prolia, XGEVA ?What should I tell my care team before I take this medication? ?They need to know if you have any of these conditions: ?dental disease ?having surgery or tooth extraction ?infection ?kidney disease ?low levels of calcium or Vitamin D in the blood ?malnutrition ?on hemodialysis ?skin conditions or sensitivity ?thyroid or parathyroid disease ?an unusual reaction to denosumab, other medicines, foods, dyes, or preservatives ?pregnant or trying to get pregnant ?breast-feeding ?How should I use this medication? ?This medicine is for injection under the skin. It is given by a health care professional in a hospital or clinic setting. ?A special MedGuide will be given to you before each treatment. Be sure to read this information carefully each time. ?For Prolia, talk to your pediatrician regarding the use of this medicine in children. Special care may be needed. For Xgeva, talk to your pediatrician regarding the use of this medicine in children. While this drug may be prescribed for children as young as 13 years for selected conditions, precautions do apply. ?Overdosage: If you think you have taken too much of this medicine contact a poison control center or emergency room at once. ?NOTE: This medicine is only for you. Do not share this medicine with others. ?What if I miss a dose? ?It is important not to miss your dose.  Call your doctor or health care professional if you are unable to keep an appointment. ?What may interact with this medication? ?Do not take this medicine with any of the following medications: ?other medicines containing denosumab ?This medicine may also interact with the following medications: ?medicines that lower your chance of fighting infection ?steroid medicines like prednisone or cortisone ?This list may not describe all possible interactions. Give your health care provider a list of all the medicines, herbs, non-prescription drugs, or dietary supplements you use. Also tell them if you smoke, drink alcohol, or use illegal drugs. Some items may interact with your medicine. ?What should I watch for while using this medication? ?Visit your doctor or health care professional for regular checks on your progress. Your doctor or health care professional may order blood tests and other tests to see how you are doing. ?Call your doctor or health care professional for advice if you get a fever, chills or sore throat, or other symptoms of a cold or flu. Do not treat yourself. This drug may decrease your body's ability to fight infection. Try to avoid being around people who are sick. ?You should make sure you get enough calcium and vitamin D while you are taking this medicine, unless your doctor tells you not to. Discuss the foods you eat and the vitamins you take with your health care professional. ?See your dentist regularly. Brush and floss your teeth as directed. Before you have any dental work done, tell your dentist you are receiving this medicine. ?Do not become pregnant while taking this medicine or for 5 months after   stopping it. Talk with your doctor or health care professional about your birth control options while taking this medicine. Women should inform their doctor if they wish to become pregnant or think they might be pregnant. There is a potential for serious side effects to an unborn child. Talk to  your health care professional or pharmacist for more information. ?What side effects may I notice from receiving this medication? ?Side effects that you should report to your doctor or health care professional as soon as possible: ?allergic reactions like skin rash, itching or hives, swelling of the face, lips, or tongue ?bone pain ?breathing problems ?dizziness ?jaw pain, especially after dental work ?redness, blistering, peeling of the skin ?signs and symptoms of infection like fever or chills; cough; sore throat; pain or trouble passing urine ?signs of low calcium like fast heartbeat, muscle cramps or muscle pain; pain, tingling, numbness in the hands or feet; seizures ?unusual bleeding or bruising ?unusually weak or tired ?Side effects that usually do not require medical attention (report to your doctor or health care professional if they continue or are bothersome): ?constipation ?diarrhea ?headache ?joint pain ?loss of appetite ?muscle pain ?runny nose ?tiredness ?upset stomach ?This list may not describe all possible side effects. Call your doctor for medical advice about side effects. You may report side effects to FDA at 1-800-FDA-1088. ?Where should I keep my medication? ?This medicine is only given in a clinic, doctor's office, or other health care setting and will not be stored at home. ?NOTE: This sheet is a summary. It may not cover all possible information. If you have questions about this medicine, talk to your doctor, pharmacist, or health care provider. ?? 2023 Elsevier/Gold Standard (2017-07-13 00:00:00) ? ?

## 2021-08-04 DIAGNOSIS — M25551 Pain in right hip: Secondary | ICD-10-CM | POA: Diagnosis not present

## 2021-08-05 DIAGNOSIS — R338 Other retention of urine: Secondary | ICD-10-CM | POA: Diagnosis not present

## 2021-08-06 ENCOUNTER — Other Ambulatory Visit: Payer: Self-pay | Admitting: Family Medicine

## 2021-08-06 DIAGNOSIS — E782 Mixed hyperlipidemia: Secondary | ICD-10-CM

## 2021-08-09 DIAGNOSIS — M25551 Pain in right hip: Secondary | ICD-10-CM | POA: Diagnosis not present

## 2021-08-11 DIAGNOSIS — N312 Flaccid neuropathic bladder, not elsewhere classified: Secondary | ICD-10-CM | POA: Diagnosis not present

## 2021-08-11 DIAGNOSIS — N210 Calculus in bladder: Secondary | ICD-10-CM | POA: Diagnosis not present

## 2021-08-17 ENCOUNTER — Encounter: Payer: Self-pay | Admitting: Family Medicine

## 2021-08-17 DIAGNOSIS — N210 Calculus in bladder: Secondary | ICD-10-CM | POA: Insufficient documentation

## 2021-08-18 DIAGNOSIS — M25551 Pain in right hip: Secondary | ICD-10-CM | POA: Diagnosis not present

## 2021-08-23 ENCOUNTER — Other Ambulatory Visit: Payer: Self-pay | Admitting: Urology

## 2021-08-23 DIAGNOSIS — M25551 Pain in right hip: Secondary | ICD-10-CM | POA: Diagnosis not present

## 2021-08-26 NOTE — Progress Notes (Addendum)
Anesthesia Review:  PCP: DR Elesa Massed  Cardiologist : none  Chest x-ray : EKG :08/30/21  Echo : Stress test: Cardiac Cath :  Activity level: can do a flight of stairs without difficulty  Sleep Study/ CPAP : none  Fasting Blood Sugar :      / Checks Blood Sugar -- times a day:   Blood Thinner/ Instructions /Last Dose: ASA / Instructions/ Last Dose :   08/03/2021- cbc and CMp normal.  Pt came in on 08/30/21 for preop appt.  Pt's friend present with pt at preop appt.  Blood pressure readings in left arm 170/113  then 157/113/  Pt stated she had to go to bathroom.  After bathroom visit blood pressure was 186/106.  PT denies any chest pain, shortness of breath , lightheadedness, heachache or blurred vision.  EKg done .  Leticia Clas made aware of EKG and blood pressure readings.  PT given copy of blood pressure readings and informed to call DR Evelena Peat office and make MD informed.  PT voiced understanding.  PT able to monitor blood pressure readings at home.  PT voices understanding to call MD today on 08/30/21 and to monitor blood pressure readings at hoime.  PT stated she had taken Verapamil this am . PT is scheduled for suprapubic change out in office on 08/31/21 per pt.

## 2021-08-26 NOTE — Progress Notes (Signed)
DUE TO COVID-19 ONLY  2  VISITOR IS ALLOWED TO COME WITH YOU AND STAY IN THE WAITING ROOM ONLY DURING PRE OP AND PROCEDURE DAY OF SURGERY.  4  VISITOR  MAY VISIT WITH YOU AFTER SURGERY IN YOUR PRIVATE ROOM DURING VISITING HOURS ONLY! YOU MAY HAVE ONE PERSON SPEND THE NITE WITH YOU IN YOUR ROOM AFTER SURGERY.     Your procedure is scheduled on:    09/06/21   Report to [facility] Main  Entrance   Report to admitting at   1015              AM DO NOT BRING INSURANCE CARD, PICTURE ID OR WALLET DAY OF SURGERY.      Call this number if you have problems the morning of surgery [phone]    REMEMBER: NO  SOLID FOODS , CANDY, GUM OR MINTS AFTER [place] .       Marland Kitchen CLEAR LIQUIDS UNTIL      0930am           DAY OF SURGERY.            CLEAR LIQUID DIET   Foods Allowed      WATER BLACK COFFEE ( SUGAR OK, NO MILK, CREAM OR CREAMER) REGULAR AND DECAF  TEA ( SUGAR OK NO MILK, CREAM, OR CREAMER) REGULAR AND DECAF  PLAIN JELLO ( NO RED)  FRUIT ICES ( NO RED, NO FRUIT PULP)  POPSICLES ( NO RED)  JUICE- APPLE, WHITE GRAPE AND WHITE CRANBERRY  SPORT DRINK LIKE GATORADE ( NO RED)  CLEAR BROTH ( VEGETABLE , CHICKEN OR BEEF)                                                                     BRUSH YOUR TEETH MORNING OF SURGERY AND RINSE YOUR MOUTH OUT, NO CHEWING GUM CANDY OR MINTS.     Take these medicines the morning of surgery with A SIP OF WATER:  verapamil    DO NOT TAKE ANY DIABETIC MEDICATIONS DAY OF YOUR SURGERY                               You may not have any metal on your body including hair pins and              piercings  Do not wear jewelry, make-up, lotions, powders or perfumes, deodorant             Do not wear nail polish on your fingernails.              IF YOU ARE A FEMALE AND WANT TO SHAVE UNDER ARMS OR LEGS PRIOR TO SURGERY YOU MUST DO SO AT LEAST 48 HOURS PRIOR TO SURGERY.              Men may shave face and neck.   Do not bring  valuables to the hospital. [place].  Contacts, dentures or bridgework may not be worn into surgery.  Leave suitcase in the car. After surgery it may be brought to  your room.     Patients discharged the day of surgery will not be allowed to drive home. IF YOU ARE HAVING SURGERY AND GOING HOME THE SAME DAY, YOU MUST HAVE AN ADULT TO DRIVE YOU HOME AND BE WITH YOU FOR 24 HOURS. YOU MAY GO HOME BY TAXI OR UBER OR ORTHERWISE, BUT AN ADULT MUST ACCOMPANY YOU HOME AND STAY WITH YOU FOR 24 HOURS.                Please read over the following fact sheets you were given: _____________________________________________________________________  [facility] - Preparing for Surgery Before surgery, you can play an important role.  Because skin is not sterile, your skin needs to be as free of germs as possible.  You can reduce the number of germs on your skin by washing with CHG (chlorahexidine gluconate) soap before surgery.  CHG is an antiseptic cleaner which kills germs and bonds with the skin to continue killing germs even after washing. Please DO NOT use if you have an allergy to CHG or antibacterial soaps.  If your skin becomes reddened/irritated stop using the CHG and inform your nurse when you arrive at Short Stay. Do not shave (including legs and underarms) for at least 48 hours prior to the first CHG shower.  You may shave your face/neck. Please follow these instructions carefully:  1.  Shower with CHG Soap the night before surgery and the  morning of Surgery.  2.  If you choose to wash your hair, wash your hair first as usual with your  normal  shampoo.  3.  After you shampoo, rinse your hair and body thoroughly to remove the  shampoo.                           4.  Use CHG as you would any other liquid soap.  You can apply chg directly  to the skin and wash                       Gently with a scrungie or clean washcloth.  5.  Apply the CHG Soap to your  body ONLY FROM THE NECK DOWN.   Do not use on face/ open                           Wound or open sores. Avoid contact with eyes, ears mouth and genitals (private parts).                       Wash face,  Genitals (private parts) with your normal soap.             6.  Wash thoroughly, paying special attention to the area where your surgery  will be performed.  7.  Thoroughly rinse your body with warm water from the neck down.  8.  DO NOT shower/wash with your normal soap after using and rinsing off  the CHG Soap.                9.  Pat yourself dry with a clean towel.            10.  Wear clean pajamas.            11.  Place clean sheets on your bed the night of your first shower and do not  sleep with pets. Day of Surgery :  Do not apply any lotions/deodorants the morning of surgery.  Please wear clean clothes to the hospital/surgery center.  FAILURE TO FOLLOW THESE INSTRUCTIONS MAY RESULT IN THE CANCELLATION OF YOUR SURGERY PATIENT SIGNATURE_________________________________  NURSE SIGNATURE__________________________________  ________________________________________________________________________

## 2021-08-30 ENCOUNTER — Encounter (HOSPITAL_COMMUNITY): Payer: Self-pay

## 2021-08-30 ENCOUNTER — Telehealth: Payer: Self-pay | Admitting: Family Medicine

## 2021-08-30 ENCOUNTER — Encounter (HOSPITAL_COMMUNITY)
Admission: RE | Admit: 2021-08-30 | Discharge: 2021-08-30 | Disposition: A | Discharge status: Home / Self Care | Payer: Medicare Other | Source: Ambulatory Visit | Attending: Urology | Admitting: Urology

## 2021-08-30 ENCOUNTER — Other Ambulatory Visit: Payer: Self-pay

## 2021-08-30 DIAGNOSIS — Z01818 Encounter for other preprocedural examination: Secondary | ICD-10-CM | POA: Insufficient documentation

## 2021-08-30 HISTORY — DX: Essential (primary) hypertension: I10

## 2021-08-30 HISTORY — DX: Personal history of urinary calculi: Z87.442

## 2021-08-30 LAB — CBC
HCT: 41.8 % (ref 36.0–46.0)
Hemoglobin: 13.1 g/dL (ref 12.0–15.0)
MCH: 29.2 pg (ref 26.0–34.0)
MCHC: 31.3 g/dL (ref 30.0–36.0)
MCV: 93.1 fL (ref 80.0–100.0)
Platelets: 243 10*3/uL (ref 150–400)
RBC: 4.49 MIL/uL (ref 3.87–5.11)
RDW: 15.2 % (ref 11.5–15.5)
WBC: 5.9 10*3/uL (ref 4.0–10.5)
nRBC: 0 % (ref 0.0–0.2)

## 2021-08-30 LAB — BASIC METABOLIC PANEL
Anion gap: 8 (ref 5–15)
BUN: 21 mg/dL (ref 8–23)
CO2: 29 mmol/L (ref 22–32)
Calcium: 9.8 mg/dL (ref 8.9–10.3)
Chloride: 104 mmol/L (ref 98–111)
Creatinine, Ser: 0.65 mg/dL (ref 0.44–1.00)
GFR, Estimated: >60 mL/min (ref >60)
Glucose, Bld: 92 mg/dL (ref 70–99)
Potassium: 4.1 mmol/L (ref 3.5–5.1)
Sodium: 141 mmol/L (ref 135–145)

## 2021-08-30 NOTE — Telephone Encounter (Signed)
It looks like the surgery is cystoscopy with treatment of bladder stones (by urologist.)  Her last 2 visits with me (May and Jan) her BP's were fine.  Not sure if pain or anxiety could be playing a role. It was high at the oncologist after last visit with me.  BP Readings from Last 3 Encounters:  08/30/21 (!) 186/106  08/03/21 (!) 177/87  07/18/21 116/68   Ensure that she is taking her verapamil as directed, and see if she can check BP elsewhere. Remind to follow low sodium diet. Not sure if patient needs an appointment here--not sure if they require anything from me before proceeding.  Patient does have anxiety, and has valium for prn use. She may want to discuss with doc/anesthesiologist about whether she should take that prior to surgery (since someone else will be driving).

## 2021-08-30 NOTE — Telephone Encounter (Signed)
Mary [name] called and said when she went for her pre opp before surgery, her blood pressure was high and they told her to advise her primary doctor. The first time it was taken showed 186/106 and they took it again after 10 minutes and it was 157/113.

## 2021-08-31 DIAGNOSIS — N312 Flaccid neuropathic bladder, not elsewhere classified: Secondary | ICD-10-CM | POA: Diagnosis not present

## 2021-08-31 NOTE — Telephone Encounter (Signed)
Left pt a Vm to call back so I can give her recommendations

## 2021-09-05 NOTE — Telephone Encounter (Signed)
Unsure if she ever called back. Looks like she is scheduled for pre-op testing tomorrow.  I guess contact her again to follow-up and see if we need to see her (I guess we will know more tomorrow, depending on if her BP is high at pre-op testing

## 2021-09-05 NOTE — H&P (Signed)
Office Visit Report     08/11/2021   --------------------------------------------------------------------------------   Mary [name]  MRN: [id number]  DOB: 05/29/1926, 88 year old Female  SSN: [id number]   PRIMARY CARE:  [name], MD  REFERRING:  Wonda Cheng. Williams Che,   PROVIDER:  Festus Aloe, M.D.  LOCATION:  [facility] [phone]     --------------------------------------------------------------------------------   CC/HPI: F/u -    1) bladder atony - S/p cysto/placement of 16 fr s/p tube on 07/12/15 by Dr. [name]. S/p tube is changed monthly. Patient much improved since SP tube placed. She caps the SP tube and then drain it.   Her July 2018 BUN was 11.7 and creatinine 0.8. No gross hematuria. She had some crust on the tube and did some renacidin irrigations. She had a difficult cath change. She has had dysuria in the urethral area. She is better. Uses renacidin. Last tube change went well - no pain. Irrigations help.   No gross hematuria. Cysto 02/22, with 4 small bladder stones. No mucosal lesions.    2) urgency and UUI. She uses a pad and is OK with it. We discussed OAB meds but she doesn't want to try it. She did PT a few years ago which really helped after she strengthened her pelvic floor. She tried PT again. She voids on her own but might leak if she waits too long (3-4 hours).    Here in the management of the above. KUB today, May 2023, with about 7 bladder stones 7-11 mm. She has been doing the irrigation. She stopped for a time and the catheter drainage was slower and restarted. Otw, she is not having LUTS or hematuria.     ALLERGIES: Bactrim TABS Biaxin TABS Macrobid CAPS Penicillins Sulfa Drugs    MEDICATIONS: Advil  Atorvastatin Calcium 40 mg tablet Oral  Calan Sr 240 mg tablet, extended release 0.5 Oral Daily  Caltrate 600 TABS Oral  Centrum TABS Oral  Ibuprofen  Lanoxin 125 mcg (0.125 mg) tablet Oral  Preservision Areds  14,320 unit-226 mg-200 unit-34.8 mg-0.8 mg capsule Oral  Renacidin 1980.6 mg-59.4 mg-980.4 mg/30 ml solution, irrigation 30 ml Intravesically Daily PRN dispense one box  Tylenol Extra Strength 500 mg tablet 0 Oral     GU PSH: Cystoscopy - 05/12/2021, 05/17/2020 Cystoscopy TURBT <2 cm - 2016 Open SP Tube Placement - 2017 Simple Change SP Tube - 08/05/2021, 07/08/2021, 06/10/2021, 05/12/2021, 03/23/2021, 02/23/2021, 01/25/2021, 12/28/2020, 11/30/2020, 10/29/2020, 10/05/2020, 09/07/2020, 08/10/2020, 07/13/2020, 06/15/2020, 04/27/2020, 03/30/2020, 03/01/2020, 02/02/2020, 01/06/2020, 12/09/2019, 11/10/2019, 10/13/2019, 09/16/2019, 08/19/2019, 2021, 2021, 2021, 2021, 2021, 2020, 2020, 2020, 2020, 2020, 2020, 2020, 2020, 2020, 2020, 2020, 2020, 2019, 2019, 2019, 2019, 2019, 2019, 2019, 2019, 2019, 2019, 2019, 2019, 2019, 2018, 2018, 2018, 2018, 2018, 2018, 2018, 2018, 2018, 2018, 2018, 2018, 2018, 2018, 2018, 2017, 2017, 2017, 2017, 2017, 2017, 2017       [place] Notes: Bladder Cystotomy With Drainage, Cystoscopy With Fulguration Small Lesion (5-33m), Knee Surgery Right, Breast Surgery Mastectomy   NON-GU PSH: No Non-GU PSH    GU PMH: Urinary Retention - 08/05/2021, Acute urinary retention, - 2017 Areflexic bladder - 07/08/2021, - 06/10/2021, renacidin irrigation sent , - 05/12/2021, - 04/19/2021, - 03/23/2021, - 02/23/2021, - 01/25/2021, - 12/28/2020, - 11/30/2020, - 10/29/2020, - 10/05/2020, - 09/07/2020, - 08/10/2020, - 07/13/2020, - 06/15/2020, SP tube changed , - 05/17/2020, - 04/27/2020, cont monthly tube changes and irrigation PRN. Cysto at one of her tube changes (just changed two days ago). , -  04/01/2020, - 03/30/2020, - 03/01/2020, - 02/02/2020, She declined cystoscopy today. Renacidin refilled. , - 01/20/2020, - 01/06/2020, - 12/09/2019, - 03/03/2019, 06/18/18, 2017-06-17, 06-17-17, Jun 17, 2017, June 17, 2016, 17-Jun-2016, June 18, 2015, 2015/06/18, Hypotonic bladder, - June 18, 2015 Bladder Stone, check KUB in a few months - 05/12/2021, Small stones- discussed cystolithopaxy and  surveillance. Will monitor. , - 05/17/2020 Mixed incontinence - 03/03/2019 Urinary Urgency (Stable) - 03/03/2019, - Jun 17, 2017 LLQ pain - 06-17-2017 Chronic cystitis (w/o hematuria) - 06/17/2016, Jun 18, 2015 Oth GU systems Signs/Symptoms, Bladder pain - 2015/06/18, Urethralgia, 2015/06/18 Urinary Tract Inf, Unspec site, Suspected urinary tract infection - 06/18/15 Urethral disorders, muscular, Muscular disorder of urethra - 18-Jun-2015 Postinfective urethral stricture, not elsewhere classified, female, Postinfective urethral stricture in female - June 18, 2015 Ureteral reflux, Vesicoureteral-reflux - 06/18/2014 Urinary Retention, Unspec, Urinary retention - 17-Jun-2012      PMH Notes:  2007-10-02 14:08:52 - Note: Breast Cancer   NON-GU PMH: Other muscle spasm - 06/17/17 Encounter for fitting and adjustment of urinary device - 06-18-15 Encounter for general adult medical examination without abnormal findings, Encounter for preventive health examination - 06/18/2015 Muscle weakness (generalized), Muscle weakness - 2015-06-18 Other lack of coordination, Other lack of coordination - June 18, 2015 Encounter for attention to cystostomy, Encounter for care or replacement of suprapubic tube - 06/18/15 Personal history of other endocrine, nutritional and metabolic disease, History of hypercholesterolemia - June 17, 2012    FAMILY HISTORY: Death In The Family Father - Runs In Family Death In The Family Mother - Runs In Family Family Health Status Number - Runs In Family No pertinent family history - Other   SOCIAL HISTORY: Marital Status: Widowed Preferred Language: English; Ethnicity: Not Hispanic Or Latino; Race: White Current Smoking Status: Patient has never smoked.   Tobacco Use Assessment Completed: Used Tobacco in last 30 days? Does drink.  Drinks 1 caffeinated drink per day.     Notes: Exercises regularly, Activities of daily living (ADL's), independent, Living Situation: Supportive and safe, Never A Smoker, Activities Of Daily Living, Self-reliant In Usual Daily Activities, Living  Independently, Alcohol Use, Marital History - Widowed, Retired From Work, Caffeine Use   REVIEW OF SYSTEMS:    GU Review Female:   Patient denies frequent urination, hard to postpone urination, burning /pain with urination, get up at night to urinate, leakage of urine, stream starts and stops, trouble starting your stream, have to strain to urinate, and being pregnant.  Gastrointestinal (Upper):   Patient denies nausea, vomiting, and indigestion/ heartburn.  Gastrointestinal (Lower):   Patient denies diarrhea and constipation.  Constitutional:   Patient denies fever, night sweats, weight loss, and fatigue.  Skin:   Patient denies skin rash/ lesion and itching.  Eyes:   Patient denies blurred vision and double vision.  Ears/ Nose/ Throat:   Patient denies sinus problems and sore throat.  Hematologic/Lymphatic:   Patient denies swollen glands and easy bruising.  Cardiovascular:   Patient denies leg swelling and chest pains.  Respiratory:   Patient denies cough and shortness of breath.  Endocrine:   Patient denies excessive thirst.  Musculoskeletal:   Patient denies back pain and joint pain.  Neurological:   Patient denies headaches and dizziness.  Psychologic:   Patient denies depression and anxiety.   VITAL SIGNS: None   MULTI-SYSTEM PHYSICAL EXAMINATION:    Constitutional: Well-nourished. No physical deformities. Normally developed. Good grooming.  Neck: Neck symmetrical, not swollen. Normal tracheal position.  Respiratory: No labored breathing, no use of accessory muscles.   Cardiovascular: Normal  temperature, normal extremity pulses, no swelling, no varicosities.  Neurologic / Psychiatric: Oriented to time, oriented to place, oriented to person. No depression, no anxiety, no agitation.  Gastrointestinal: No mass, no tenderness, no rigidity, non obese abdomen.     PAST DATA REVIEW: None   PROCEDURES:         KUB - 74018  A single view of the abdomen is obtained.  Calculi:  seven  bladder stone from 8-11 mm.       The bones appeared normal. The bowel gas pattern appeared normal. The soft tissues were unremarkable. . Patient confirmed No Neulasta OnPro Device.           Urinalysis w/Scope Dipstick Dipstick Cont'd Micro  Color: Yellow Bilirubin: Neg mg/dL WBC/hpf: >60/hpf  Appearance: Slightly Cloudy Ketones: Neg mg/dL RBC/hpf: 0 - 2/hpf  Specific Gravity: 1.025 Blood: Neg ery/uL Bacteria: Many (>50/hpf)  pH: 6.0 Protein: 2+ mg/dL Cystals: NS (Not Seen)  Glucose: Neg mg/dL Urobilinogen: 0.2 mg/dL Casts: NS (Not Seen)    Nitrites: Neg Trichomonas: Not Present    Leukocyte Esterase: 3+ leu/uL Mucous: Present      Epithelial Cells: NS (Not Seen)      Yeast: NS (Not Seen)      Sperm: Not Present    ASSESSMENT:      ICD-10 Details  1 GU:   Areflexic bladder - N31.2 Chronic, Stable  2   Bladder Stone - N21.0 Chronic, Stable - We discussed the nature r/b/a to laser cystolithopaxy in the OR. She will proceed.    PLAN:           Orders Labs Urine Culture          Schedule Return Visit/Planned Activity: Next Available Appointment - Schedule Surgery          Document Letter(s):  Created for Patient: Clinical Summary         Notes:   cc: Dr. Tomi Bamberger         Next Appointment:      Next Appointment: 08/31/2021 10:30 AM    Appointment Type: Nurse Visit NO Urine    Location: [facility] [phone]    Provider: NVPodA NVPodA    Reason for Visit: 1 mo cath change      * Signed by Festus Aloe, M.D. on 08/13/21 at 2:34 PM (EDT)*      The information contained in this medical record document is considered private and confidential patient information. This information can only be used for the medical diagnosis and/or medical services that are being provided by the patient's selected caregivers. This information can only be distributed outside of the patient's care if the patient agrees and signs waivers of authorization for this  information to be sent to an outside source or route.  Add: I called and spoke to [place]. Her husband Dr. Scherrie Bateman is retired from [employer]. They are niece and nephew of Jenita. The other niece and nephew in law live down in [place] and her nephew in law is Dr. Ellery Plunk. He is a urologist there. They were all involved in the decision to place the suprapubic tube. Dr. Scherrie Bateman and Dr. Girtha Rm have reviewed the KUB. Dr. Girtha Rm makes a good point the stone on the right side of the film/patient could feasibly be in the right distal ureter. I did look back and she had microscopic hematuria on 1 urinalysis. I will plan to add retrogrades and possible ureteroscopy to the posting/green sheet. Also we discussed  the urine culture grew a pansensitive E. coli I will definitely get her started on preoperative antibiotics.  Add: started doxycycline 09/02/2021.

## 2021-09-05 NOTE — Telephone Encounter (Signed)
Spoke with patient and gave her your recommendations. Said her bp was a little high at the pre-op today but they are still doing the procedure as planned tomorrow. I told her to please let us know if her bp's remain high and we would be happy to see her.

## 2021-09-06 ENCOUNTER — Encounter (HOSPITAL_COMMUNITY)
Admission: RE | Disposition: A | Discharge status: Home / Self Care | Payer: Self-pay | Source: Home / Self Care | Attending: Urology

## 2021-09-06 ENCOUNTER — Ambulatory Visit (HOSPITAL_COMMUNITY): Payer: Medicare Other

## 2021-09-06 ENCOUNTER — Ambulatory Visit (HOSPITAL_COMMUNITY): Payer: Medicare Other | Admitting: Physician Assistant

## 2021-09-06 ENCOUNTER — Ambulatory Visit (HOSPITAL_COMMUNITY)
Admission: RE | Admit: 2021-09-06 | Discharge: 2021-09-06 | Disposition: A | Discharge status: Home / Self Care | Payer: Medicare Other | Attending: Urology | Admitting: Urology

## 2021-09-06 ENCOUNTER — Ambulatory Visit (HOSPITAL_BASED_OUTPATIENT_CLINIC_OR_DEPARTMENT_OTHER): Payer: Medicare Other | Admitting: Anesthesiology

## 2021-09-06 ENCOUNTER — Encounter (HOSPITAL_COMMUNITY): Payer: Self-pay | Admitting: Urology

## 2021-09-06 DIAGNOSIS — Z960 Presence of urogenital implants: Secondary | ICD-10-CM | POA: Insufficient documentation

## 2021-09-06 DIAGNOSIS — I10 Essential (primary) hypertension: Secondary | ICD-10-CM | POA: Insufficient documentation

## 2021-09-06 DIAGNOSIS — N312 Flaccid neuropathic bladder, not elsewhere classified: Secondary | ICD-10-CM | POA: Insufficient documentation

## 2021-09-06 DIAGNOSIS — N210 Calculus in bladder: Secondary | ICD-10-CM | POA: Insufficient documentation

## 2021-09-06 DIAGNOSIS — R310 Gross hematuria: Secondary | ICD-10-CM | POA: Diagnosis not present

## 2021-09-06 DIAGNOSIS — N310 Uninhibited neuropathic bladder, not elsewhere classified: Secondary | ICD-10-CM | POA: Diagnosis not present

## 2021-09-06 DIAGNOSIS — Z01818 Encounter for other preprocedural examination: Secondary | ICD-10-CM

## 2021-09-06 DIAGNOSIS — N201 Calculus of ureter: Secondary | ICD-10-CM | POA: Diagnosis not present

## 2021-09-06 HISTORY — PX: CYSTOSCOPY WITH LITHOLAPAXY: SHX1425

## 2021-09-06 SURGERY — CYSTOSCOPY, WITH BLADDER CALCULUS LITHOLAPAXY
Anesthesia: General

## 2021-09-06 MED ORDER — LIDOCAINE HCL (CARDIAC) PF 100 MG/5ML IV SOSY
PREFILLED_SYRINGE | INTRAVENOUS | Status: DC | PRN
  Administered 2021-09-06: 60 mg via INTRAVENOUS

## 2021-09-06 MED ORDER — ORAL CARE MOUTH RINSE
15.0000 mL | Freq: Once | OROMUCOSAL | Status: AC
Start: 2021-09-06 — End: 2021-09-06

## 2021-09-06 MED ORDER — ROCURONIUM BROMIDE 100 MG/10ML IV SOLN
INTRAVENOUS | Status: DC | PRN
  Administered 2021-09-06: 40 mg via INTRAVENOUS
  Administered 2021-09-06: 20 mg via INTRAVENOUS

## 2021-09-06 MED ORDER — CHLORHEXIDINE GLUCONATE 0.12 % MT SOLN
15.0000 mL | Freq: Once | OROMUCOSAL | Status: AC
Start: 2021-09-06 — End: 2021-09-06
  Administered 2021-09-06: 15 mL via OROMUCOSAL

## 2021-09-06 MED ORDER — AMISULPRIDE (ANTIEMETIC) 5 MG/2ML IV SOLN
5.0000 mg | Freq: Once | INTRAVENOUS | Status: AC
Start: 2021-09-06 — End: 2021-09-06
  Administered 2021-09-06: 5 mg via INTRAVENOUS

## 2021-09-06 MED ORDER — DEXAMETHASONE SODIUM PHOSPHATE 10 MG/ML IJ SOLN
INTRAMUSCULAR | Status: DC | PRN
  Administered 2021-09-06: 10 mg via INTRAVENOUS

## 2021-09-06 MED ORDER — STERILE WATER FOR IRRIGATION IR SOLN
Status: DC | PRN
  Administered 2021-09-06: 500 mL

## 2021-09-06 MED ORDER — PROPOFOL 10 MG/ML IV BOLUS
INTRAVENOUS | Status: AC
  Filled 2021-09-06: qty 20

## 2021-09-06 MED ORDER — FENTANYL CITRATE (PF) 100 MCG/2ML IJ SOLN
INTRAMUSCULAR | Status: DC | PRN
  Administered 2021-09-06 (×2): 50 ug via INTRAVENOUS

## 2021-09-06 MED ORDER — ONDANSETRON HCL 4 MG/2ML IJ SOLN
INTRAMUSCULAR | Status: DC | PRN
  Administered 2021-09-06: 4 mg via INTRAVENOUS

## 2021-09-06 MED ORDER — LACTATED RINGERS IV SOLN: INTRAVENOUS | Status: DC

## 2021-09-06 MED ORDER — SUGAMMADEX SODIUM 200 MG/2ML IV SOLN
INTRAVENOUS | Status: DC | PRN
  Administered 2021-09-06: 200 mg via INTRAVENOUS

## 2021-09-06 MED ORDER — ACETAMINOPHEN 500 MG PO TABS
1000.0000 mg | ORAL_TABLET | Freq: Once | ORAL | Status: AC
  Administered 2021-09-06: 1000 mg via ORAL
  Filled 2021-09-06: qty 2

## 2021-09-06 MED ORDER — SODIUM CHLORIDE 0.9 % IR SOLN
Status: DC | PRN
  Administered 2021-09-06: 6000 mL

## 2021-09-06 MED ORDER — FENTANYL CITRATE PF 50 MCG/ML IJ SOSY: 25.0000 ug | PREFILLED_SYRINGE | INTRAMUSCULAR | Status: DC | PRN

## 2021-09-06 MED ORDER — AMISULPRIDE (ANTIEMETIC) 5 MG/2ML IV SOLN
INTRAVENOUS | Status: AC
  Filled 2021-09-06: qty 2

## 2021-09-06 MED ORDER — PROPOFOL 10 MG/ML IV BOLUS
INTRAVENOUS | Status: DC | PRN
  Administered 2021-09-06: 20 mg via INTRAVENOUS
  Administered 2021-09-06: 70 mg via INTRAVENOUS

## 2021-09-06 MED ORDER — IOHEXOL 300 MG/ML  SOLN
INTRAMUSCULAR | Status: DC | PRN
  Administered 2021-09-06: 20 mL

## 2021-09-06 MED ORDER — CEFAZOLIN SODIUM-DEXTROSE 2-4 GM/100ML-% IV SOLN
2.0000 g | Freq: Once | INTRAVENOUS | Status: AC
  Administered 2021-09-06: 2 g via INTRAVENOUS
  Filled 2021-09-06: qty 100

## 2021-09-06 SURGICAL SUPPLY — 19 items
BAG URO CATCHER STRL LF (MISCELLANEOUS) ×3 IMPLANT
CATH FOLEY 2WAY SLVR  5CC 16FR (CATHETERS) ×2
CATH FOLEY 2WAY SLVR 5CC 16FR (CATHETERS) IMPLANT
CATH URETL OPEN END 6FR 70 (CATHETERS) ×1 IMPLANT
CLOTH BEACON ORANGE TIMEOUT ST (SAFETY) ×3 IMPLANT
GAUZE SPONGE 4X4 12PLY STRL (GAUZE/BANDAGES/DRESSINGS) ×1 IMPLANT
GLOVE SURG LX 7.5 STRW (GLOVE) ×1
GLOVE SURG LX STRL 7.5 STRW (GLOVE) ×2 IMPLANT
GOWN STRL REUS W/ TWL XL LVL3 (GOWN DISPOSABLE) ×2 IMPLANT
GOWN STRL REUS W/TWL XL LVL3 (GOWN DISPOSABLE) ×2
LASER FIB FLEXIVA PULSE ID 910 (Laser) ×1 IMPLANT
MANIFOLD NEPTUNE II (INSTRUMENTS) ×3 IMPLANT
PACK CYSTO (CUSTOM PROCEDURE TRAY) ×3 IMPLANT
PLUG CATH AND CAP STER (CATHETERS) ×1 IMPLANT
SYR 10ML LL (SYRINGE) ×1 IMPLANT
SYR TOOMEY IRRIG 70ML (MISCELLANEOUS)
SYRINGE TOOMEY IRRIG 70ML (MISCELLANEOUS) IMPLANT
TUBING CONNECTING 10 (TUBING) ×1 IMPLANT
TUBING UROLOGY SET (TUBING) ×3 IMPLANT

## 2021-09-06 NOTE — Anesthesia Preprocedure Evaluation (Addendum)
Anesthesia Evaluation  Patient identified by MRN, date of birth, ID band Patient awake    Reviewed: Allergy & Precautions, H&P , NPO status , Patient's Chart, lab work & pertinent test results  Airway Mallampati: II  TM Distance: >3 FB Neck ROM: Full    Dental no notable dental hx. (+) Teeth Intact, Dental Advisory Given   Pulmonary neg pulmonary ROS,    Pulmonary exam normal breath sounds clear to auscultation       Cardiovascular hypertension, Pt. on medications  Rhythm:Regular Rate:Normal     Neuro/Psych negative neurological ROS  negative psych ROS   GI/Hepatic negative GI ROS, Neg liver ROS,   Endo/Other  negative endocrine ROS  Renal/GU negative Renal ROS  negative genitourinary   Musculoskeletal  (+) Arthritis , Osteoarthritis,    Abdominal   Peds  Hematology negative hematology ROS (+)   Anesthesia Other Findings   Reproductive/Obstetrics negative OB ROS                            Anesthesia Physical Anesthesia Plan  ASA: 2  Anesthesia Plan: General   Post-op Pain Management: Tylenol PO (pre-op)*   Induction: Intravenous  PONV Risk Score and Plan: 4 or greater and Ondansetron, Dexamethasone and Treatment may vary due to age or medical condition  Airway Management Planned: Oral ETT  Additional Equipment:   Intra-op Plan:   Post-operative Plan: Extubation in OR  Informed Consent: I have reviewed the patients History and Physical, chart, labs and discussed the procedure including the risks, benefits and alternatives for the proposed anesthesia with the patient or authorized representative who has indicated his/her understanding and acceptance.     Dental advisory given  Plan Discussed with: CRNA  Anesthesia Plan Comments:         Anesthesia Quick Evaluation

## 2021-09-06 NOTE — Progress Notes (Signed)
Patient was updated that the OR has been delayed about 90 minutes and her surgery will not be starting on time. Patient voiced understanding. Call bell within reach, reminded patient to call if her suprapubic catheter needs emptied again. Patient voiced understanding. Just emptied 253m of yellow urine.

## 2021-09-06 NOTE — Op Note (Signed)
Preoperative diagnosis: Bladder stones, gross hematuria  Postoperative diagnosis: Same  Procedure: Cystoscopy with bilateral retrograde pyelogram, cystolitholopaxy with holmium laser less than 2 cm, suprapubic tube catheter change  Surgeon: Junious Silk  Anesthesia: General  Indication for procedure Mary [name] is a 88 year old female with a large capacity atonic bladder status post suprapubic tube a few years ago.  She has had a buildup of stones and was brought today.  One of the stones was quite lateral and we thought might of been in the ureter.  Given the prior hematuria as well we went ahead and did retrogrades.  Findings: On cystoscopy the urethra and the bladder were unremarkable.  Bladder mucosa was healthy without lesion or erythema or papillary tumor.  There were multiple stones in the bladder.  Many small fragments or small stones and 6 larger stones that were individually lasered.  Right retrograde pyelogram-this outlined a single ureter single collecting system unit without filling defect, stricture or dilation.  There was brisk drainage.  Left retrograde pyelogram-this outlined a single ureter single collecting system unit without filling defect, stricture or dilation.  There was brisk drainage.  Description of procedure: After consent was obtained patient brought to the operating room.  After adequate anesthesia she was placed in lithotomy position and prepped and draped in the usual sterile fashion.  Timeout was performed to confirm the patient and procedure.  Cystoscope was passed per urethra the bladder carefully inspected with 30 degree and 70 degree lens.   Right ureteral orifice was cannulated with a 6 [place] open-ended catheter and retrograde injection of contrast was performed.  Left ureteral orifice was cannulated with a 6 [place] open-ended catheter and left retrograde injection of contrast was performed.  Scout imaging as well as spot imaging with fluoroscopy was obtained.   I  then uncap the suprapubic tube to serve as continuous-flow and passed 1000 m laser fiber and all the stones were dusted and evacuated.  A picture of the stone fragments was uploaded to the chart.  They were discarded.  The existing suprapubic tube was removed and the site prepped with Betadine.  A new 16 French suprapubic tube was passed under direct vision with the balloon inflated and seated at the bladder wall.  Urine drainage was clear.  I left to gravity drainage for overnight.  She was then awakened taken the cover room in stable condition.  Complications: None  Blood loss: Minimal  Specimens: None  Drains: 16 French suprapubic tube  Disposition: Patient stable to PACU-I discussed the procedure, postop care and follow-up with [name] and [name].

## 2021-09-06 NOTE — Interval H&P Note (Signed)
History and Physical Interval Note:  09/06/2021 12:36 PM  Kaija Meline  has presented today for surgery, with the diagnosis of MICROHEMATURIA, BLADDER STONES, BLADDER ATONY.  The various methods of treatment have been discussed with the patient and family. After consideration of risks, benefits and other options for treatment, the patient has consented to  Procedure(s) with comments: CYSTOSCOPY WITH LITHOLAPAXY/ HOLMIUM LASER LITHOTRIPSY/ BILATERAL RETROGRADE PYELOGRAM/ POSSIBLE URETEROSCOPY (N/A) - ONLY NEEDS 90 MIN as a surgical intervention.  The patient's history has been reviewed, patient examined, no change in status, stable for surgery.  I have reviewed the patient's chart and labs.  Questions were answered to the patient's satisfaction.  She is on her abx. We discussed my conversations with Dr. Girtha Rm. She has no fever and clear urine. Discussed she may need a stent and a staged procedure among all the other details and risks.    Festus Aloe

## 2021-09-06 NOTE — Anesthesia Postprocedure Evaluation (Signed)
Anesthesia Post Note  Patient: Mary [name]  Procedure(s) Performed: CYSTOSCOPY WITH LITHOLAPAXY < 2cm, HOLMIUM LASER LITHOTRIPSY, BILATERAL RETROGRADE PYELOGRAM, SUPRAPUBIC CATHETER EXCHANGE     Patient location during evaluation: PACU Anesthesia Type: General Level of consciousness: awake and alert Pain management: pain level controlled Vital Signs Assessment: post-procedure vital signs reviewed and stable Respiratory status: spontaneous breathing, nonlabored ventilation and respiratory function stable Cardiovascular status: blood pressure returned to baseline and stable Postop Assessment: no apparent nausea or vomiting Anesthetic complications: no   No notable events documented.  Last Vitals:  Vitals:   09/06/21 1545 09/06/21 1600  BP: (!) 185/93 (!) 180/94  Pulse: 82 80  Resp: 19 20  Temp: 36.6 C   SpO2: 100% 95%    Last Pain:  Vitals:   09/06/21 1600  TempSrc:   PainSc: 0-No pain                 Zakaria Sedor,W. EDMOND

## 2021-09-06 NOTE — Transfer of Care (Signed)
Immediate Anesthesia Transfer of Care Note  Patient: Mary [name]  Procedure(s) Performed: CYSTOSCOPY WITH LITHOLAPAXY < 2cm, HOLMIUM LASER LITHOTRIPSY, BILATERAL RETROGRADE PYELOGRAM, SUPRAPUBIC CATHETER EXCHANGE  Patient Location: PACU  Anesthesia Type:General  Level of Consciousness: awake and alert   Airway & Oxygen Therapy: Patient Spontanous Breathing and Patient connected to face mask oxygen  Post-op Assessment: Report given to RN and Post -op Vital signs reviewed and stable  Post vital signs: Reviewed and stable  Last Vitals:  Vitals Value Taken Time  BP 182/94 09/06/21 1543  Temp    Pulse 80 09/06/21 1544  Resp 21 09/06/21 1544  SpO2 100 % 09/06/21 1544  Vitals shown include unvalidated device data.  Last Pain:  Vitals:   09/06/21 1117  TempSrc:   PainSc: 0-No pain         Complications: No notable events documented.

## 2021-09-06 NOTE — Anesthesia Procedure Notes (Signed)
Procedure Name: Intubation Date/Time: 09/06/2021 2:17 PM  Performed by: Mehki Klumpp, Forest Gleason, CRNAPre-anesthesia Checklist: Patient identified, Emergency Drugs available, Suction available, Patient being monitored and Timeout performed Patient Re-evaluated:Patient Re-evaluated prior to induction Oxygen Delivery Method: Circle system utilized Preoxygenation: Pre-oxygenation with 100% oxygen Induction Type: IV induction Ventilation: Mask ventilation without difficulty Laryngoscope Size: 4 Grade View: Grade III Tube type: Oral Tube size: 7.0 mm Number of attempts: 1 Airway Equipment and Method: Stylet Secured at: 21 cm Tube secured with: Tape Dental Injury: Teeth and Oropharynx as per pre-operative assessment

## 2021-09-06 NOTE — Discharge Instructions (Addendum)
Cystoscopy, Care After The following information offers guidance on how to care for yourself after your procedure. Your health care provider may also give you more specific instructions. If you have problems or questions, contact your health care provider.  -Keep the catheter connected to the bag to drain the bladder overnight.  It is okay to cap/plug the suprapubic tube tomorrow morning 09/07/2021 and empty the bladder like you normally do.   What can I expect after the procedure? After the procedure, it is common to have: Mild pain and burning in your bladder or kidney area when you urinate. Small amounts of blood in your urine. A sudden urge to urinate. A need to urinate more often than usual. Follow these instructions at home: Medicines Take over-the-counter and prescription medicines only as told by your health care provider. Take acetaminophen 600 to 650 mg every 6 hours as needed or ibuprofen 400 mg every 6 hours as needed Activity Rest as told by your health care provider. If you were given a sedative during the procedure, it can affect you for several hours. Do not drive or operate machinery until your health care provider says that it is safe. Return to your normal activities as told by your health care provider. Ask your health care provider what activities are safe for you. General instructions A bathtub filled with water.   Take a warm bath to relieve any burning feeling around your urethra. Hold a warm, damp washcloth over the urethral area to ease pain. It is up to you to get the results of your procedure. Ask your health care provider, or the department that is doing the procedure, when your results will be ready. Keep all follow-up visits. This is important. Contact a health care provider if: Your symptoms do not improve within 24 hours, and you keep having: Burning when you urinate. More blood in your urine. Pain when you urinate. An urgent need to urinate. A need to  urinate more often than usual. Get help right away if: You have a fever. You have a lot of blood in your urine. You have bright red blood in your urine. You are passing blood clots in your urine. You have severe pain. You cannot urinate. Summary After the procedure, it is common to have mild pain in your bladder or kidney area, mild burning with urination, and some blood. Take over-the-counter and prescription medicines only as told by your health care provider. If you were prescribed an antibiotic medicine, do not stop using the antibiotic even if you start to feel better. Rest after the procedure. Follow instructions from your health care provider for home care. Get help right away if you have a lot of blood, bright red blood, or blood clots in your urine, severe pain, or fever. This information is not intended to replace advice given to you by your health care provider. Make sure you discuss any questions you have with your health care provider. Document Revised: 02/13/2021 Document Reviewed: 02/13/2021 Elsevier Patient Education  [place].

## 2021-09-07 ENCOUNTER — Encounter (HOSPITAL_COMMUNITY): Payer: Self-pay | Admitting: Urology

## 2021-10-05 DIAGNOSIS — N312 Flaccid neuropathic bladder, not elsewhere classified: Secondary | ICD-10-CM | POA: Diagnosis not present

## 2021-10-25 DIAGNOSIS — L219 Seborrheic dermatitis, unspecified: Secondary | ICD-10-CM | POA: Diagnosis not present

## 2021-10-25 DIAGNOSIS — L570 Actinic keratosis: Secondary | ICD-10-CM | POA: Diagnosis not present

## 2021-10-25 DIAGNOSIS — L820 Inflamed seborrheic keratosis: Secondary | ICD-10-CM | POA: Diagnosis not present

## 2021-10-25 DIAGNOSIS — D045 Carcinoma in situ of skin of trunk: Secondary | ICD-10-CM | POA: Diagnosis not present

## 2021-10-25 DIAGNOSIS — D485 Neoplasm of uncertain behavior of skin: Secondary | ICD-10-CM | POA: Diagnosis not present

## 2021-11-01 DIAGNOSIS — N312 Flaccid neuropathic bladder, not elsewhere classified: Secondary | ICD-10-CM | POA: Diagnosis not present

## 2021-11-02 DIAGNOSIS — M25551 Pain in right hip: Secondary | ICD-10-CM | POA: Diagnosis not present

## 2021-11-04 ENCOUNTER — Other Ambulatory Visit: Payer: Self-pay | Admitting: Family Medicine

## 2021-11-04 DIAGNOSIS — I10 Essential (primary) hypertension: Secondary | ICD-10-CM

## 2021-11-04 DIAGNOSIS — R002 Palpitations: Secondary | ICD-10-CM

## 2021-11-11 DIAGNOSIS — C44529 Squamous cell carcinoma of skin of other part of trunk: Secondary | ICD-10-CM | POA: Diagnosis not present

## 2021-11-11 DIAGNOSIS — D485 Neoplasm of uncertain behavior of skin: Secondary | ICD-10-CM | POA: Diagnosis not present

## 2021-11-11 DIAGNOSIS — L820 Inflamed seborrheic keratosis: Secondary | ICD-10-CM | POA: Diagnosis not present

## 2021-11-13 ENCOUNTER — Other Ambulatory Visit: Payer: Self-pay | Admitting: Family Medicine

## 2021-11-13 DIAGNOSIS — E782 Mixed hyperlipidemia: Secondary | ICD-10-CM

## 2021-11-16 DIAGNOSIS — C44529 Squamous cell carcinoma of skin of other part of trunk: Secondary | ICD-10-CM | POA: Diagnosis not present

## 2021-11-23 ENCOUNTER — Encounter: Payer: Self-pay | Admitting: Internal Medicine

## 2021-11-28 DIAGNOSIS — N312 Flaccid neuropathic bladder, not elsewhere classified: Secondary | ICD-10-CM | POA: Diagnosis not present

## 2021-11-30 DIAGNOSIS — M1712 Unilateral primary osteoarthritis, left knee: Secondary | ICD-10-CM | POA: Diagnosis not present

## 2021-12-07 DIAGNOSIS — R8271 Bacteriuria: Secondary | ICD-10-CM | POA: Diagnosis not present

## 2021-12-07 DIAGNOSIS — N312 Flaccid neuropathic bladder, not elsewhere classified: Secondary | ICD-10-CM | POA: Diagnosis not present

## 2021-12-08 ENCOUNTER — Other Ambulatory Visit: Payer: Self-pay | Admitting: Family Medicine

## 2021-12-08 DIAGNOSIS — L299 Pruritus, unspecified: Secondary | ICD-10-CM | POA: Diagnosis not present

## 2021-12-08 DIAGNOSIS — L661 Lichen planopilaris: Secondary | ICD-10-CM | POA: Diagnosis not present

## 2021-12-08 MED ORDER — VALIUM 2 MG PO TABS: 2.0000 mg | ORAL_TABLET | Freq: Three times a day (TID) | ORAL | 0 refills | Status: DC | PRN

## 2021-12-08 NOTE — Telephone Encounter (Signed)
For some reason somebody took this off her med list. So I have to re-enter the order. Prior rx says valium, not diazepam. I vaguely remember her wanting brand in the past. Can you double check with her (since the request didn't come from the pharmacy) if she wants brand Valium or if she is okay with the generic diazepam?  Thanks  (Pended order, but can change to DAW if she prefers brand)

## 2021-12-08 NOTE — Telephone Encounter (Signed)
You are correct, patient only want the branded Valium.

## 2021-12-08 NOTE — Telephone Encounter (Signed)
Pt requesting a refill on Valium 2 mg to [place], [place]

## 2021-12-14 DIAGNOSIS — M1611 Unilateral primary osteoarthritis, right hip: Secondary | ICD-10-CM | POA: Diagnosis not present

## 2021-12-20 DIAGNOSIS — H5032 Intermittent alternating esotropia: Secondary | ICD-10-CM | POA: Diagnosis not present

## 2021-12-26 ENCOUNTER — Telehealth: Payer: Self-pay | Admitting: Family Medicine

## 2021-12-26 NOTE — Telephone Encounter (Signed)
Pt called and states that she has a appt at pharmacy to get the flu and RSV shots. She was wondering if she should get the COVID instead of RSV. Please advise pt at [phone].

## 2021-12-26 NOTE — Telephone Encounter (Signed)
I recommend that she get all 3, it is just a matter of timing.  She can get the flu shot along with either RSV or COVID, and get the other shot 2 weeks later.  I don't have a preference for which order, as long as she continues to be careful when out (since COVID levels are high). Please advise.

## 2021-12-26 NOTE — Telephone Encounter (Signed)
Patient advised.

## 2021-12-27 ENCOUNTER — Encounter: Payer: Self-pay | Admitting: Internal Medicine

## 2021-12-27 DIAGNOSIS — Z23 Encounter for immunization: Secondary | ICD-10-CM | POA: Diagnosis not present

## 2021-12-27 DIAGNOSIS — R338 Other retention of urine: Secondary | ICD-10-CM | POA: Diagnosis not present

## 2022-01-06 DIAGNOSIS — L2084 Intrinsic (allergic) eczema: Secondary | ICD-10-CM | POA: Diagnosis not present

## 2022-01-06 DIAGNOSIS — L820 Inflamed seborrheic keratosis: Secondary | ICD-10-CM | POA: Diagnosis not present

## 2022-01-11 IMAGING — MR MR HEAD WO/W CM
13 of 14 series · 41 of 48 positions shown · IV contrast (multihance)
Comparison: None.

CLINICAL DATA: Diplopia.

EXAM:
MRI HEAD AND ORBITS WITH CONTRAST
TECHNIQUE: Multiplanar, multiecho pulse sequences of the brain and surrounding
structures were obtained with intravenous contrast. Multiplanar,
multiecho pulse sequences of the orbits and surrounding structures
were obtained including fat saturation techniques, after the
intravenous contrast administration.
CONTRAST:  10mL MULTIHANCE GADOBENATE DIMEGLUMINE 529 MG/ML IV SOLN

[Series 6: DWI · coronal · 5.0mm · 1.80mm/px · 5 of 68 slices shown (1 of 4)]
[im 1/68]
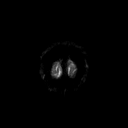
[im 17/68]
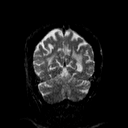
[im 34/68]
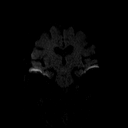
[im 51/68]
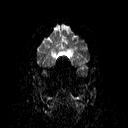
[im 68/68]
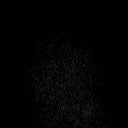

[Series 7: DWI · coronal · 5.0mm · 1.80mm/px · 2 of 34 slices shown (2 of 4)]
[im 1/34]
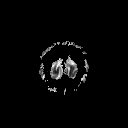
[im 34/34]
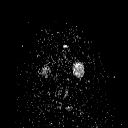

[Series 8: FLAIR · axial · 3.0mm · 0.45mm/px · 1 of 13 slices shown (1 of 2)]
[im 1/13]
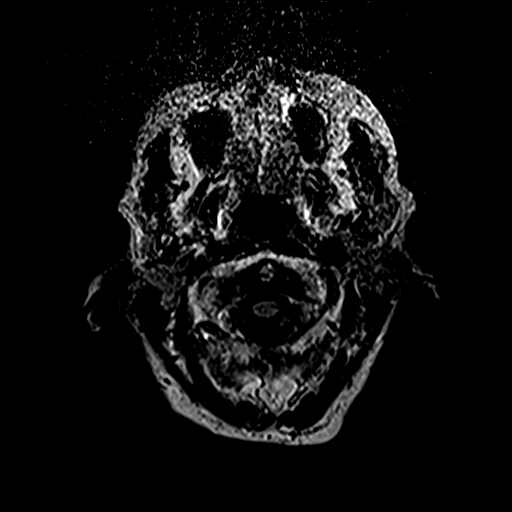

[Series 9: T1 · sagittal · 5.0mm · 0.45mm/px · 1 of 19 slices shown]
[im 1/19]
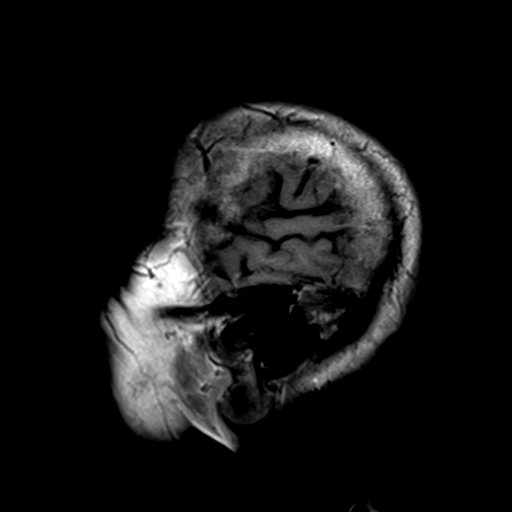

[Series 10: DWI · axial · 3.0mm · 1.80mm/px · z∈[-56,+90]mm · 6 of 100 slices shown (3 of 4)]
[im 1/100]
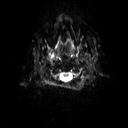
[im 20/100]
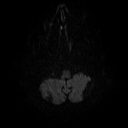
[im 40/100]
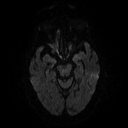
[im 60/100]
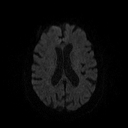
[im 80/100]
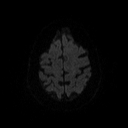
[im 100/100]
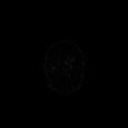

[Series 11: DWI · axial · 3.0mm · 1.80mm/px · z∈[-56,+90]mm · 3 of 50 slices shown (4 of 4)]
[im 1/50]
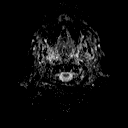
[im 25/50]
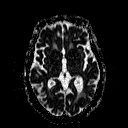
[im 50/50]
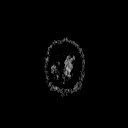

[Series 12: FLAIR · axial · 3.0mm · 0.45mm/px · 1 of 25 slices shown (2 of 2)]
[im 1/25]
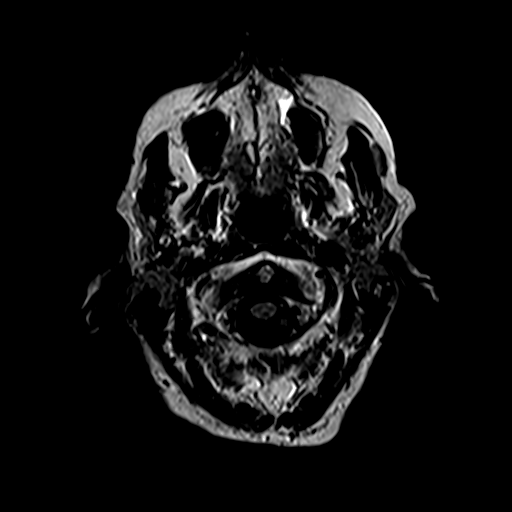

[Series 13: T2 · axial · 5.0mm · 0.51mm/px · 1 of 23 slices shown (1 of 2)]
[im 1/23]
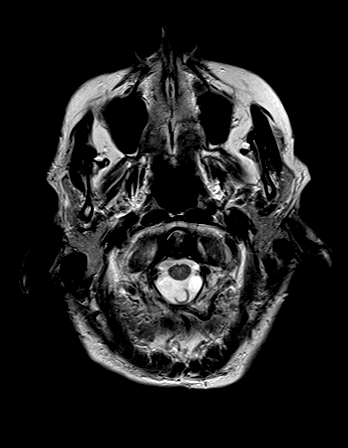

[Series 15: swi_images · axial · 2.0mm · 0.90mm/px · z∈[-61,+95]mm · 5 of 80 slices shown]
[im 1/80]
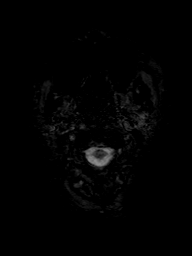
[im 20/80]
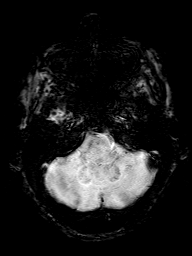
[im 40/80]
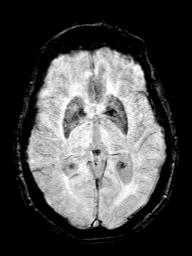
[im 60/80]
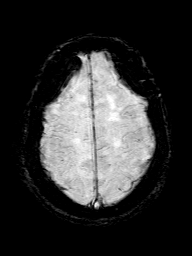
[im 80/80]
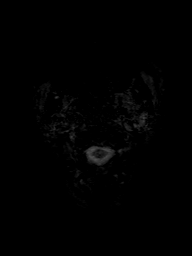

[Series 16: t1_mpr_tra copy center · axial · 1.0mm · 0.45mm/px · z∈[-62,+96]mm · 8 of 160 slices shown]
[im 1/160]
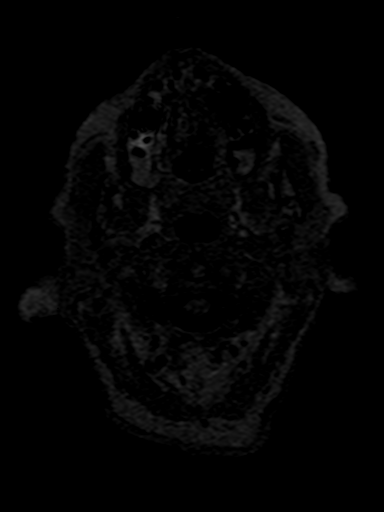
[im 20/160]
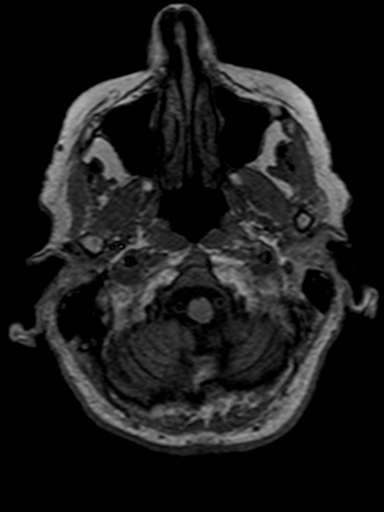
[im 40/160]
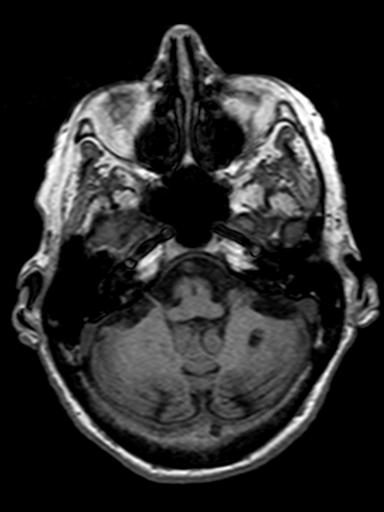
[im 60/160]
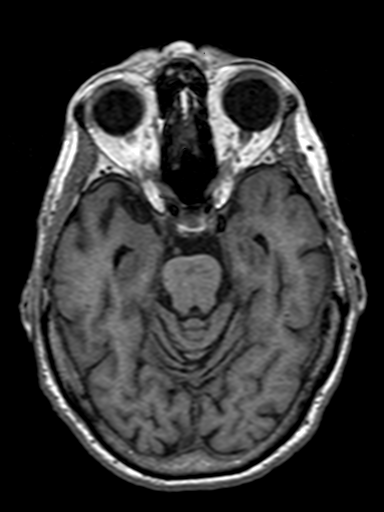
[im 100/160]
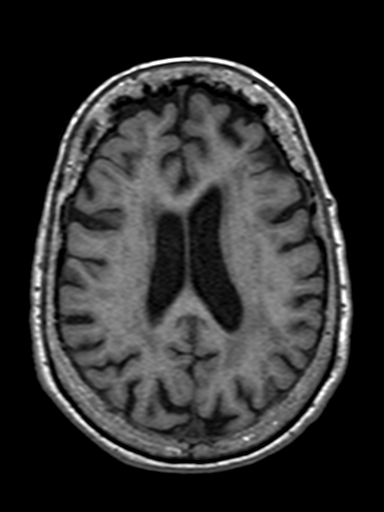
[im 120/160]
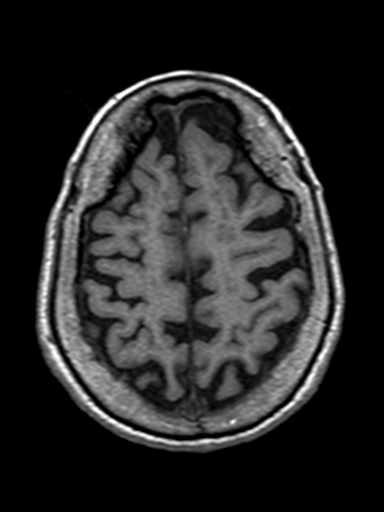
[im 140/160]
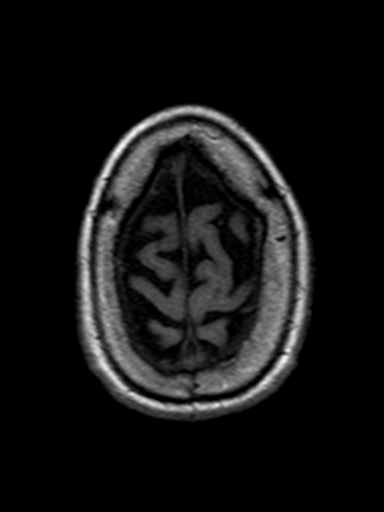
[im 160/160]
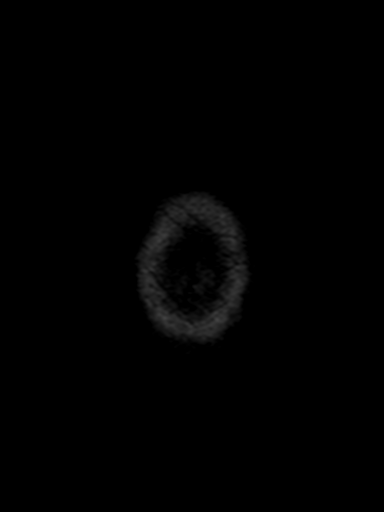

[Series 17: T2 · coronal · 5.0mm · 0.45mm/px · 2 of 27 slices shown (2 of 2)]
[im 1/27]
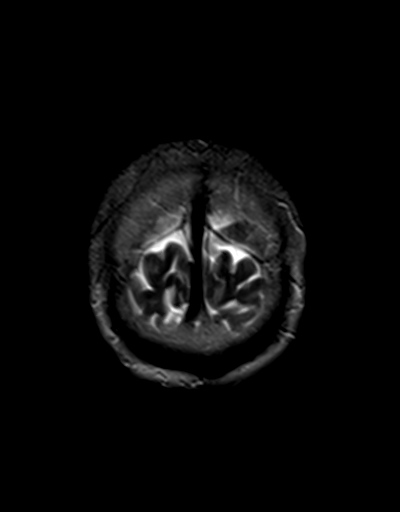
[im 27/27]
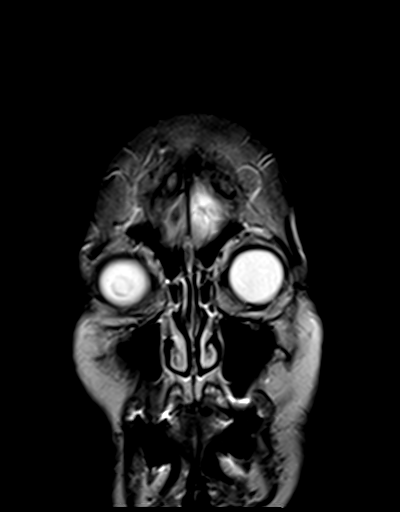

[Series 18: post_t1_mpr_tra · axial · 1.0mm · 0.45mm/px · z∈[-45,+14]mm · 4 of 160 slices shown]
[im 1/160]
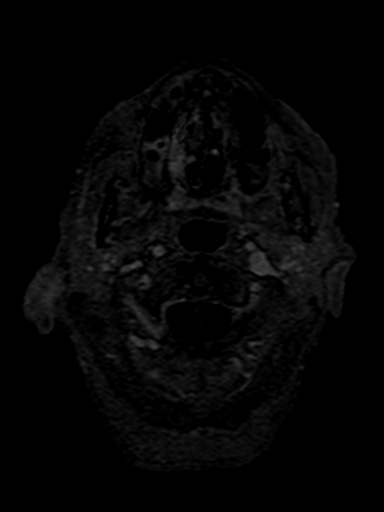
[im 20/160]
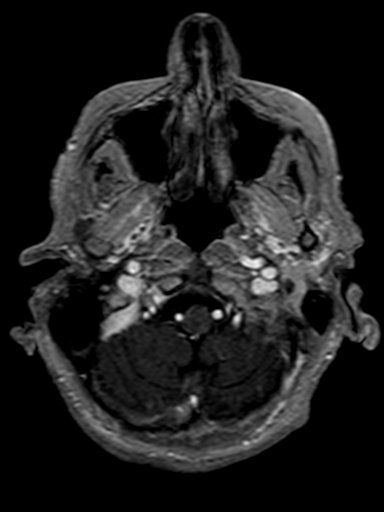
[im 40/160]
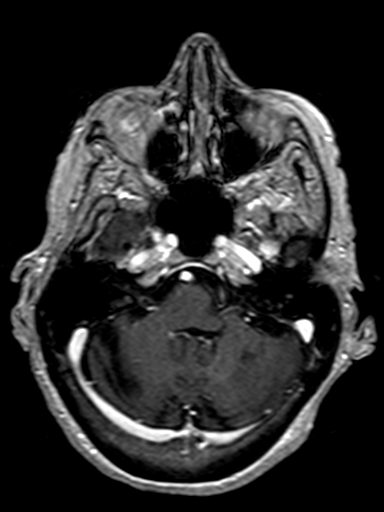
[im 60/160]
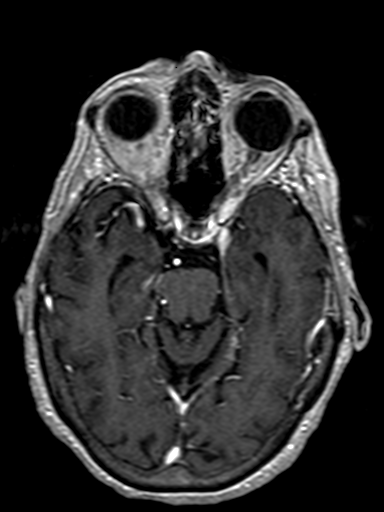

[Series 19: T1 post-contrast · coronal · 5.0mm · 0.45mm/px · 2 of 28 slices shown]
[im 1/28]
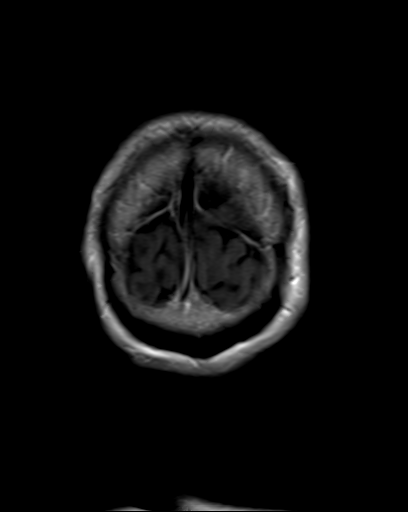
[im 28/28]
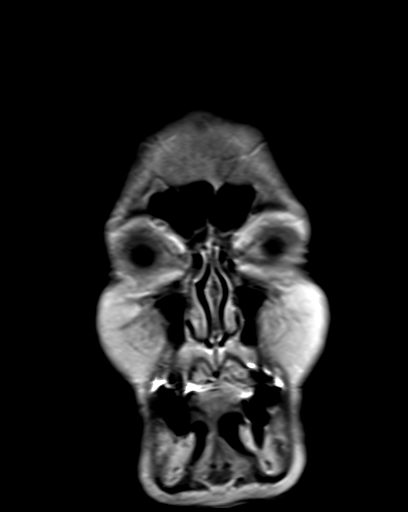

[41 of 48 positions shown; findings below may reference images not displayed]

FINDINGS: MRI HEAD FINDINGS

Brain: No acute infarction, hemorrhage, hydrocephalus, extra-axial
collection or mass lesion. Scattered and confluent foci of T2
hyperintensity are seen within the white matter of the cerebral
hemispheres and within the pons, nonspecific, most likely related to
chronic small vessel ischemia. Remote lacunar infarct in the left
cerebellar hemisphere. Punctate foci of susceptibility artifact are
noted in the right temporal lobe, within the pons and bilateral
cerebellar hemispheres suggesting small hemosiderin deposits. Mild
parenchymal volume loss. No focus of abnormal contrast enhancement.

Vascular: Normal flow voids.

Skull and upper cervical spine: Bilateral lens surgery. Paranasal
sinuses are clear.

Other: Mild bilateral mastoid effusion.

MRI ORBITS FINDINGS

Orbits: No traumatic or inflammatory finding. Bilateral lens surgery
and bilateral staphylomas. Optic nerves, orbital fat, extraocular
muscles, vascular structures, and lacrimal glands are normal.

Visualized sinuses: Mild mucosal thickening of the ethmoid cells.

Soft tissues: Negative.
IMPRESSION: 1. No acute intracranial abnormality.
2. Moderate chronic microvascular ischemic changes of the white
matter and mild parenchymal volume loss.
3. Bilateral lens surgery and staphylomas. Otherwise, negative MRI
of the orbits.

## 2022-01-15 NOTE — Patient Instructions (Incomplete)
RSV vaccine is recommended.  This is covered by Medicare Part D, so you need to get this from the pharmacy.  This should be separated from other vaccines by 2 weeks.  I also recommend getting the new shingles vaccine (Shingrix). Since you have Medicare, you will need to get this from the pharmacy, as it is covered by Part D. This is a series of 2 injections, spaced 2 months apart.   This should be separated from other vaccines by at least 2 weeks.  I encourage you to periodically check your blood pressure at home.  You often get very high readings at other doctors offices. If your blood pressure at home is under 140/90, I won't change your medications, but if it is higher at home (when relaxed, and not rushed), then I would add another blood pressure lowering medication.  Goal is <130/80.  I recommend using Tea Tree Oil shampoo, and leaving it on the scalp for at least 5 minutes, if possible.   Try taking Zyrtec at bedtime to see if this helps with your itching.  This lasts all day, whereas Benedryl wears off sooner, and isn't as safe (fall risk at night). Use the Zyrtec IN PLACE OF the Claritin (don't take both).  Be sure that you take advil WITH FOOD.  Fiber will really help your bowels.Consider taking a fiber supplement once daily, such as Benefiber or Metamucil (powder, in a large glass of water).

## 2022-01-15 NOTE — Progress Notes (Signed)
No chief complaint on file.  Patient presents for 6 month follow-up on chronic problems.  Hypertension and palpitations. She continues to do well off digoxin, without recurrence of palpitations/tachycardia.   She is taking verapamil '360mg'$ , and is trying to limit the sodium in her diet. She hasn't been checking BP's at home.   UPDATE  BP was very high when she had her Prolia injection through oncology office in May, and at pre-op visit for urology procedure in June.  No headaches, dizziness, chest pain.   BP Readings from Last 3 Encounters:  09/06/21 (!) 165/99  08/30/21 (!) 186/106  08/03/21 (!) 177/87    Constipation:  She no longer gets the urge to defecate, but she has been going well.  She takes almost a full cap of Miralax daily, which works most of the time.  She still takes the stool softener daily.  She uses a suppository "to make your bowels move", can't recall the name, about 2x/week. She denies any blood in the stool or abdominal pain.   Hyperlipidemia follow-up: Patient is reportedly following a low cholesterol diet. Compliant with medications (atorvastatin '40mg'$ ) and denies medication side effects.  Lab Results  Component Value Date   CHOL 191 03/28/2021   HDL 82 03/28/2021   LDLCALC 90 03/28/2021   TRIG 107 03/28/2021   CHOLHDL 2.3 03/28/2021     Urinary retention--she has suprapubic catheter. She drains it about every 2-3 hours.  She goes to the urologist's office once a month to have the catheter changed, and sees Dr. Junious Silk yearly.  She had cystoscopy and treatment for bladder stones in June 2023. Currently she denies pain, cloudy or bloody urine, no odor.   Osteoporosis: She has been taking Prolia through Dr. Virgie Dad office since 09/2012. Last treatment was in 07/2021. She is past due for f/u DEXA, discussed with Dr. Lindi Adie and declines further testing, but will continue with Prolia treatments. She only takes caltrate once daily due to constipation (was worse  when taking BID).  She has cottage cheese, yogurt and milk daily.    H/o breast cancer:  Now under the care of Dr. Lindi Adie yearly, previously saw Dr. [name].  She denies any breast concerns or complaints. She no longer wants to have mammograms.   Left knee pain/arthritis--s/p injections in the left knee, under the care of Dr. Wynelle Link.  Last cortisone injection was 11/2021 (underwent viscosupplementation injections last year). She currently denies any significant left knee pain.  She also has R hip arthritis, with right groin pain. She has been getting some improvement from intra-articular injections.  Last was in 10/2021, was advised she could have q3 months prn.    She is taking Tylenol--she takes 2 extra strength Tylenol in the morning, and 2 additional doses throughout the day, 4 hours apart ('3000mg'$  total daily).   UPDATE She denies having pain when she gets up and goes to the bathroom at night. She reports having a lot of pain first thing in the morning, to the point that her daughter almost took her to the ER when she was visiting. Dr. Wynelle Link had told her that she can take 1 advil a day if needed for breakthrough pain.  She knows to take this with food.    PMH, PSH, SH reviewed   ROS: no fever, chills, headaches, dizziness, chest pain, palpitations, shortness of breath, URI symptoms. No n/v/d.  Constipation per HPI. No urinary complaints, see HPI. L knee and R hip/groin pain per HPI   PHYSICAL  EXAM:  LMP  (LMP Unknown)   Wt Readings from Last 3 Encounters:  09/06/21 120 lb (54.4 kg)  08/30/21 120 lb (54.4 kg)  08/03/21 120 lb 4.8 oz (54.6 kg)   Pleasant, elderly female, in no distress. HEENT: conjunctiva and sclera are clear, EOMI. Fundi benign. OP clear Neck: no lymphadenopathy, thyromegaly or mass, no carotid bruit. Heart: Regular rate and rhythm Lungs: clear bilaterally Back: no spinal or CVA tenderness Abdomen: soft, nontender, no mass, no organomegaly or  mass Extremities: no edema, 2+ pulses Skin: normal turgor, no rash Neuro: alert and oriented, normal gait. Cranial nerves intact, DTR's symmetric. Psych: normal mood, affect, hygiene and grooming. Normal speech, eye contact.   ASSESSMENT/PLAN:   Flu and COVID booster RSV and Shingrix from pharmacy  BP above goal elsewhere. Checking at home???? If not checking at home (or if above goal there), add ARB and arrange f/u  RF verapamil, lipitor  Needs to schedule AWV in May 2023.  Fasting (no labs needed today, can wait until May)

## 2022-01-16 ENCOUNTER — Encounter: Payer: Self-pay | Admitting: Family Medicine

## 2022-01-16 ENCOUNTER — Ambulatory Visit (INDEPENDENT_AMBULATORY_CARE_PROVIDER_SITE_OTHER): Payer: Medicare Other | Admitting: Family Medicine

## 2022-01-16 VITALS — BP 138/74 | HR 68 | Ht 62.0 in | Wt 123.4 lb

## 2022-01-16 DIAGNOSIS — Z9359 Other cystostomy status: Secondary | ICD-10-CM | POA: Diagnosis not present

## 2022-01-16 DIAGNOSIS — M159 Polyosteoarthritis, unspecified: Secondary | ICD-10-CM

## 2022-01-16 DIAGNOSIS — I10 Essential (primary) hypertension: Secondary | ICD-10-CM | POA: Diagnosis not present

## 2022-01-16 DIAGNOSIS — M810 Age-related osteoporosis without current pathological fracture: Secondary | ICD-10-CM

## 2022-01-16 DIAGNOSIS — K5900 Constipation, unspecified: Secondary | ICD-10-CM | POA: Diagnosis not present

## 2022-01-16 DIAGNOSIS — R002 Palpitations: Secondary | ICD-10-CM | POA: Diagnosis not present

## 2022-01-16 DIAGNOSIS — Z853 Personal history of malignant neoplasm of breast: Secondary | ICD-10-CM

## 2022-01-16 DIAGNOSIS — Z7185 Encounter for immunization safety counseling: Secondary | ICD-10-CM | POA: Diagnosis not present

## 2022-01-16 DIAGNOSIS — E782 Mixed hyperlipidemia: Secondary | ICD-10-CM

## 2022-01-16 MED ORDER — VERAPAMIL HCL ER 360 MG PO CP24: 360.0000 mg | ORAL_CAPSULE | Freq: Every day | ORAL | 1 refills | Status: DC

## 2022-01-16 MED ORDER — ATORVASTATIN CALCIUM 40 MG PO TABS: 40.0000 mg | ORAL_TABLET | Freq: Every day | ORAL | 1 refills | Status: DC

## 2022-01-17 DIAGNOSIS — N312 Flaccid neuropathic bladder, not elsewhere classified: Secondary | ICD-10-CM | POA: Diagnosis not present

## 2022-01-17 DIAGNOSIS — R338 Other retention of urine: Secondary | ICD-10-CM | POA: Diagnosis not present

## 2022-01-25 DIAGNOSIS — M25551 Pain in right hip: Secondary | ICD-10-CM | POA: Diagnosis not present

## 2022-02-01 DIAGNOSIS — H532 Diplopia: Secondary | ICD-10-CM | POA: Diagnosis not present

## 2022-02-01 DIAGNOSIS — H353131 Nonexudative age-related macular degeneration, bilateral, early dry stage: Secondary | ICD-10-CM | POA: Diagnosis not present

## 2022-02-01 DIAGNOSIS — H524 Presbyopia: Secondary | ICD-10-CM | POA: Diagnosis not present

## 2022-02-01 DIAGNOSIS — H26493 Other secondary cataract, bilateral: Secondary | ICD-10-CM | POA: Diagnosis not present

## 2022-02-02 ENCOUNTER — Other Ambulatory Visit: Payer: Self-pay | Admitting: *Deleted

## 2022-02-02 DIAGNOSIS — I10 Essential (primary) hypertension: Secondary | ICD-10-CM

## 2022-02-03 ENCOUNTER — Other Ambulatory Visit: Payer: Self-pay

## 2022-02-03 ENCOUNTER — Inpatient Hospital Stay: Payer: Medicare Other | Attending: Hematology and Oncology

## 2022-02-03 ENCOUNTER — Inpatient Hospital Stay: Payer: Medicare Other

## 2022-02-03 VITALS — BP 154/77 | HR 89 | Temp 97.6°F | Resp 16

## 2022-02-03 DIAGNOSIS — Z170 Estrogen receptor positive status [ER+]: Secondary | ICD-10-CM

## 2022-02-03 DIAGNOSIS — M818 Other osteoporosis without current pathological fracture: Secondary | ICD-10-CM

## 2022-02-03 DIAGNOSIS — M810 Age-related osteoporosis without current pathological fracture: Secondary | ICD-10-CM | POA: Insufficient documentation

## 2022-02-03 DIAGNOSIS — I10 Essential (primary) hypertension: Secondary | ICD-10-CM

## 2022-02-03 DIAGNOSIS — Z853 Personal history of malignant neoplasm of breast: Secondary | ICD-10-CM | POA: Diagnosis not present

## 2022-02-03 LAB — CBC WITH DIFFERENTIAL (CANCER CENTER ONLY)
Abs Immature Granulocytes: 0.05 10*3/uL (ref 0.00–0.07)
Basophils Absolute: 0.1 10*3/uL (ref 0.0–0.1)
Basophils Relative: 1 %
Eosinophils Absolute: 0.2 10*3/uL (ref 0.0–0.5)
Eosinophils Relative: 2 %
HCT: 39.6 % (ref 36.0–46.0)
Hemoglobin: 12.8 g/dL (ref 12.0–15.0)
Immature Granulocytes: 1 %
Lymphocytes Relative: 9 %
Lymphs Abs: 0.9 10*3/uL (ref 0.7–4.0)
MCH: 29.4 pg (ref 26.0–34.0)
MCHC: 32.3 g/dL (ref 30.0–36.0)
MCV: 91 fL (ref 80.0–100.0)
Monocytes Absolute: 1.1 10*3/uL — ABNORMAL HIGH (ref 0.1–1.0)
Monocytes Relative: 11 %
Neutro Abs: 7.6 10*3/uL (ref 1.7–7.7)
Neutrophils Relative %: 76 %
Platelet Count: 273 10*3/uL (ref 150–400)
RBC: 4.35 MIL/uL (ref 3.87–5.11)
RDW: 14.9 % (ref 11.5–15.5)
WBC Count: 9.8 10*3/uL (ref 4.0–10.5)
nRBC: 0 % (ref 0.0–0.2)

## 2022-02-03 LAB — CMP (CANCER CENTER ONLY)
ALT: 14 U/L (ref 0–44)
AST: 14 U/L — ABNORMAL LOW (ref 15–41)
Albumin: 4.2 g/dL (ref 3.5–5.0)
Alkaline Phosphatase: 81 U/L (ref 38–126)
Anion gap: 4 — ABNORMAL LOW (ref 5–15)
BUN: 25 mg/dL — ABNORMAL HIGH (ref 8–23)
CO2: 34 mmol/L — ABNORMAL HIGH (ref 22–32)
Calcium: 9.7 mg/dL (ref 8.9–10.3)
Chloride: 101 mmol/L (ref 98–111)
Creatinine: 0.75 mg/dL (ref 0.44–1.00)
GFR, Estimated: >60 mL/min (ref >60)
Glucose, Bld: 90 mg/dL (ref 70–99)
Potassium: 3.7 mmol/L (ref 3.5–5.1)
Sodium: 139 mmol/L (ref 135–145)
Total Bilirubin: 0.6 mg/dL (ref 0.3–1.2)
Total Protein: 7.5 g/dL (ref 6.5–8.1)

## 2022-02-03 MED ORDER — DENOSUMAB 60 MG/ML ~~LOC~~ SOSY
60.0000 mg | PREFILLED_SYRINGE | Freq: Once | SUBCUTANEOUS | Status: DC
  Filled 2022-02-03: qty 1

## 2022-02-06 DIAGNOSIS — H6123 Impacted cerumen, bilateral: Secondary | ICD-10-CM | POA: Diagnosis not present

## 2022-02-11 ENCOUNTER — Other Ambulatory Visit: Payer: Self-pay | Admitting: Family Medicine

## 2022-02-11 DIAGNOSIS — E782 Mixed hyperlipidemia: Secondary | ICD-10-CM

## 2022-02-14 DIAGNOSIS — N312 Flaccid neuropathic bladder, not elsewhere classified: Secondary | ICD-10-CM | POA: Diagnosis not present

## 2022-02-22 DIAGNOSIS — H3500 Unspecified background retinopathy: Secondary | ICD-10-CM | POA: Diagnosis not present

## 2022-02-22 DIAGNOSIS — H26493 Other secondary cataract, bilateral: Secondary | ICD-10-CM | POA: Diagnosis not present

## 2022-02-22 DIAGNOSIS — H353123 Nonexudative age-related macular degeneration, left eye, advanced atrophic without subfoveal involvement: Secondary | ICD-10-CM | POA: Diagnosis not present

## 2022-03-08 DIAGNOSIS — L219 Seborrheic dermatitis, unspecified: Secondary | ICD-10-CM | POA: Diagnosis not present

## 2022-03-08 DIAGNOSIS — L661 Lichen planopilaris: Secondary | ICD-10-CM | POA: Diagnosis not present

## 2022-03-08 DIAGNOSIS — L2084 Intrinsic (allergic) eczema: Secondary | ICD-10-CM | POA: Diagnosis not present

## 2022-03-14 DIAGNOSIS — N312 Flaccid neuropathic bladder, not elsewhere classified: Secondary | ICD-10-CM | POA: Diagnosis not present

## 2022-03-19 ENCOUNTER — Emergency Department (HOSPITAL_BASED_OUTPATIENT_CLINIC_OR_DEPARTMENT_OTHER): Payer: Medicare Other | Admitting: Radiology

## 2022-03-19 ENCOUNTER — Emergency Department (HOSPITAL_BASED_OUTPATIENT_CLINIC_OR_DEPARTMENT_OTHER)
Admission: EM | Admit: 2022-03-19 | Discharge: 2022-03-19 | Discharge status: Against Medical Advice | Payer: Medicare Other | Source: Home / Self Care

## 2022-03-19 ENCOUNTER — Other Ambulatory Visit: Payer: Self-pay

## 2022-03-19 ENCOUNTER — Encounter (HOSPITAL_BASED_OUTPATIENT_CLINIC_OR_DEPARTMENT_OTHER): Payer: Self-pay

## 2022-03-19 DIAGNOSIS — R059 Cough, unspecified: Secondary | ICD-10-CM | POA: Insufficient documentation

## 2022-03-19 DIAGNOSIS — Z9049 Acquired absence of other specified parts of digestive tract: Secondary | ICD-10-CM | POA: Diagnosis not present

## 2022-03-19 DIAGNOSIS — R509 Fever, unspecified: Secondary | ICD-10-CM | POA: Insufficient documentation

## 2022-03-19 DIAGNOSIS — J439 Emphysema, unspecified: Secondary | ICD-10-CM | POA: Diagnosis not present

## 2022-03-19 DIAGNOSIS — R1031 Right lower quadrant pain: Secondary | ICD-10-CM | POA: Diagnosis not present

## 2022-03-19 DIAGNOSIS — M160 Bilateral primary osteoarthritis of hip: Secondary | ICD-10-CM | POA: Diagnosis present

## 2022-03-19 DIAGNOSIS — M79604 Pain in right leg: Secondary | ICD-10-CM | POA: Diagnosis not present

## 2022-03-19 DIAGNOSIS — Z803 Family history of malignant neoplasm of breast: Secondary | ICD-10-CM | POA: Diagnosis not present

## 2022-03-19 DIAGNOSIS — Z79899 Other long term (current) drug therapy: Secondary | ICD-10-CM | POA: Diagnosis not present

## 2022-03-19 DIAGNOSIS — J988 Other specified respiratory disorders: Secondary | ICD-10-CM | POA: Diagnosis not present

## 2022-03-19 DIAGNOSIS — R5381 Other malaise: Secondary | ICD-10-CM | POA: Diagnosis not present

## 2022-03-19 DIAGNOSIS — N312 Flaccid neuropathic bladder, not elsewhere classified: Secondary | ICD-10-CM | POA: Diagnosis present

## 2022-03-19 DIAGNOSIS — R8271 Bacteriuria: Secondary | ICD-10-CM | POA: Diagnosis not present

## 2022-03-19 DIAGNOSIS — Z85828 Personal history of other malignant neoplasm of skin: Secondary | ICD-10-CM | POA: Diagnosis not present

## 2022-03-19 DIAGNOSIS — Z833 Family history of diabetes mellitus: Secondary | ICD-10-CM | POA: Diagnosis not present

## 2022-03-19 DIAGNOSIS — Z5321 Procedure and treatment not carried out due to patient leaving prior to being seen by health care provider: Secondary | ICD-10-CM | POA: Insufficient documentation

## 2022-03-19 DIAGNOSIS — Z86010 Personal history of colonic polyps: Secondary | ICD-10-CM | POA: Diagnosis not present

## 2022-03-19 DIAGNOSIS — R52 Pain, unspecified: Secondary | ICD-10-CM | POA: Diagnosis not present

## 2022-03-19 DIAGNOSIS — Z87442 Personal history of urinary calculi: Secondary | ICD-10-CM | POA: Diagnosis not present

## 2022-03-19 DIAGNOSIS — Z800 Family history of malignant neoplasm of digestive organs: Secondary | ICD-10-CM | POA: Diagnosis not present

## 2022-03-19 DIAGNOSIS — E871 Hypo-osmolality and hyponatremia: Secondary | ICD-10-CM | POA: Diagnosis not present

## 2022-03-19 DIAGNOSIS — Z1152 Encounter for screening for COVID-19: Secondary | ICD-10-CM | POA: Diagnosis not present

## 2022-03-19 DIAGNOSIS — Z83438 Family history of other disorder of lipoprotein metabolism and other lipidemia: Secondary | ICD-10-CM | POA: Diagnosis not present

## 2022-03-19 DIAGNOSIS — E782 Mixed hyperlipidemia: Secondary | ICD-10-CM | POA: Diagnosis not present

## 2022-03-19 DIAGNOSIS — N3001 Acute cystitis with hematuria: Secondary | ICD-10-CM | POA: Diagnosis not present

## 2022-03-19 DIAGNOSIS — R278 Other lack of coordination: Secondary | ICD-10-CM | POA: Diagnosis not present

## 2022-03-19 DIAGNOSIS — E569 Vitamin deficiency, unspecified: Secondary | ICD-10-CM | POA: Diagnosis not present

## 2022-03-19 DIAGNOSIS — M25551 Pain in right hip: Secondary | ICD-10-CM | POA: Diagnosis not present

## 2022-03-19 DIAGNOSIS — Z823 Family history of stroke: Secondary | ICD-10-CM | POA: Diagnosis not present

## 2022-03-19 DIAGNOSIS — J069 Acute upper respiratory infection, unspecified: Secondary | ICD-10-CM | POA: Diagnosis not present

## 2022-03-19 DIAGNOSIS — R2689 Other abnormalities of gait and mobility: Secondary | ICD-10-CM | POA: Diagnosis not present

## 2022-03-19 DIAGNOSIS — K625 Hemorrhage of anus and rectum: Secondary | ICD-10-CM | POA: Diagnosis not present

## 2022-03-19 DIAGNOSIS — I10 Essential (primary) hypertension: Secondary | ICD-10-CM | POA: Diagnosis not present

## 2022-03-19 DIAGNOSIS — Z20822 Contact with and (suspected) exposure to covid-19: Secondary | ICD-10-CM | POA: Insufficient documentation

## 2022-03-19 DIAGNOSIS — H9113 Presbycusis, bilateral: Secondary | ICD-10-CM | POA: Diagnosis not present

## 2022-03-19 DIAGNOSIS — R531 Weakness: Secondary | ICD-10-CM | POA: Diagnosis not present

## 2022-03-19 DIAGNOSIS — M810 Age-related osteoporosis without current pathological fracture: Secondary | ICD-10-CM | POA: Diagnosis present

## 2022-03-19 DIAGNOSIS — M6281 Muscle weakness (generalized): Secondary | ICD-10-CM | POA: Diagnosis not present

## 2022-03-19 DIAGNOSIS — M1711 Unilateral primary osteoarthritis, right knee: Secondary | ICD-10-CM | POA: Diagnosis present

## 2022-03-19 DIAGNOSIS — E860 Dehydration: Secondary | ICD-10-CM | POA: Diagnosis not present

## 2022-03-19 DIAGNOSIS — I498 Other specified cardiac arrhythmias: Secondary | ICD-10-CM | POA: Diagnosis present

## 2022-03-19 DIAGNOSIS — H04129 Dry eye syndrome of unspecified lacrimal gland: Secondary | ICD-10-CM | POA: Diagnosis not present

## 2022-03-19 DIAGNOSIS — R000 Tachycardia, unspecified: Secondary | ICD-10-CM | POA: Diagnosis not present

## 2022-03-19 DIAGNOSIS — Z8249 Family history of ischemic heart disease and other diseases of the circulatory system: Secondary | ICD-10-CM | POA: Diagnosis not present

## 2022-03-19 DIAGNOSIS — Z853 Personal history of malignant neoplasm of breast: Secondary | ICD-10-CM | POA: Diagnosis not present

## 2022-03-19 DIAGNOSIS — R051 Acute cough: Secondary | ICD-10-CM | POA: Diagnosis not present

## 2022-03-19 DIAGNOSIS — Z7401 Bed confinement status: Secondary | ICD-10-CM | POA: Diagnosis not present

## 2022-03-19 DIAGNOSIS — R0602 Shortness of breath: Secondary | ICD-10-CM | POA: Diagnosis not present

## 2022-03-19 LAB — RESP PANEL BY RT-PCR (RSV, FLU A&B, COVID)  RVPGX2
Influenza A by PCR: NEGATIVE
Influenza B by PCR: NEGATIVE
Resp Syncytial Virus by PCR: NEGATIVE
SARS Coronavirus 2 by RT PCR: NEGATIVE

## 2022-03-19 NOTE — ED Triage Notes (Signed)
Patient here POV from Home.  Endorses Productive Cough for 2 Days ago associated with Fever.   100.6 Temperature 2 Hours ago.   NAD Noted during Triage. A&Ox4. GCS 15. BIB Wheelchair.

## 2022-03-21 ENCOUNTER — Ambulatory Visit (INDEPENDENT_AMBULATORY_CARE_PROVIDER_SITE_OTHER): Payer: Medicare Other | Admitting: Medical

## 2022-03-21 ENCOUNTER — Emergency Department (HOSPITAL_COMMUNITY): Payer: Medicare Other

## 2022-03-21 ENCOUNTER — Encounter (HOSPITAL_COMMUNITY): Payer: Self-pay

## 2022-03-21 ENCOUNTER — Inpatient Hospital Stay (HOSPITAL_COMMUNITY)
Admission: EM | Admit: 2022-03-21 | Discharge: 2022-03-24 | DRG: 153 | Disposition: A | Discharge status: Skilled Nursing Facility | Payer: Medicare Other | Attending: Internal Medicine | Admitting: Internal Medicine

## 2022-03-21 ENCOUNTER — Encounter: Payer: Self-pay | Admitting: Medical

## 2022-03-21 VITALS — BP 130/84 | HR 59 | Temp 99.2°F | Resp 24

## 2022-03-21 DIAGNOSIS — Z9049 Acquired absence of other specified parts of digestive tract: Secondary | ICD-10-CM | POA: Diagnosis not present

## 2022-03-21 DIAGNOSIS — Z888 Allergy status to other drugs, medicaments and biological substances status: Secondary | ICD-10-CM

## 2022-03-21 DIAGNOSIS — R8271 Bacteriuria: Secondary | ICD-10-CM | POA: Diagnosis not present

## 2022-03-21 DIAGNOSIS — R5381 Other malaise: Secondary | ICD-10-CM | POA: Diagnosis present

## 2022-03-21 DIAGNOSIS — Z823 Family history of stroke: Secondary | ICD-10-CM

## 2022-03-21 DIAGNOSIS — Z86010 Personal history of colonic polyps: Secondary | ICD-10-CM | POA: Diagnosis not present

## 2022-03-21 DIAGNOSIS — M6281 Muscle weakness (generalized): Secondary | ICD-10-CM | POA: Diagnosis not present

## 2022-03-21 DIAGNOSIS — R52 Pain, unspecified: Secondary | ICD-10-CM | POA: Diagnosis not present

## 2022-03-21 DIAGNOSIS — Z7983 Long term (current) use of bisphosphonates: Secondary | ICD-10-CM

## 2022-03-21 DIAGNOSIS — Z87442 Personal history of urinary calculi: Secondary | ICD-10-CM | POA: Diagnosis not present

## 2022-03-21 DIAGNOSIS — R000 Tachycardia, unspecified: Secondary | ICD-10-CM | POA: Diagnosis not present

## 2022-03-21 DIAGNOSIS — Z803 Family history of malignant neoplasm of breast: Secondary | ICD-10-CM

## 2022-03-21 DIAGNOSIS — R531 Weakness: Secondary | ICD-10-CM

## 2022-03-21 DIAGNOSIS — R051 Acute cough: Secondary | ICD-10-CM | POA: Diagnosis not present

## 2022-03-21 DIAGNOSIS — I10 Essential (primary) hypertension: Secondary | ICD-10-CM | POA: Diagnosis present

## 2022-03-21 DIAGNOSIS — Z83438 Family history of other disorder of lipoprotein metabolism and other lipidemia: Secondary | ICD-10-CM

## 2022-03-21 DIAGNOSIS — Z8249 Family history of ischemic heart disease and other diseases of the circulatory system: Secondary | ICD-10-CM | POA: Diagnosis not present

## 2022-03-21 DIAGNOSIS — R0602 Shortness of breath: Secondary | ICD-10-CM | POA: Diagnosis not present

## 2022-03-21 DIAGNOSIS — K625 Hemorrhage of anus and rectum: Secondary | ICD-10-CM | POA: Diagnosis not present

## 2022-03-21 DIAGNOSIS — R059 Cough, unspecified: Secondary | ICD-10-CM | POA: Diagnosis not present

## 2022-03-21 DIAGNOSIS — I498 Other specified cardiac arrhythmias: Secondary | ICD-10-CM | POA: Diagnosis present

## 2022-03-21 DIAGNOSIS — Z1152 Encounter for screening for COVID-19: Secondary | ICD-10-CM | POA: Diagnosis not present

## 2022-03-21 DIAGNOSIS — E782 Mixed hyperlipidemia: Secondary | ICD-10-CM | POA: Diagnosis present

## 2022-03-21 DIAGNOSIS — Z833 Family history of diabetes mellitus: Secondary | ICD-10-CM

## 2022-03-21 DIAGNOSIS — J988 Other specified respiratory disorders: Secondary | ICD-10-CM

## 2022-03-21 DIAGNOSIS — Z7401 Bed confinement status: Secondary | ICD-10-CM | POA: Diagnosis not present

## 2022-03-21 DIAGNOSIS — N312 Flaccid neuropathic bladder, not elsewhere classified: Secondary | ICD-10-CM | POA: Diagnosis present

## 2022-03-21 DIAGNOSIS — M1711 Unilateral primary osteoarthritis, right knee: Secondary | ICD-10-CM | POA: Diagnosis present

## 2022-03-21 DIAGNOSIS — Z85828 Personal history of other malignant neoplasm of skin: Secondary | ICD-10-CM

## 2022-03-21 DIAGNOSIS — L659 Nonscarring hair loss, unspecified: Secondary | ICD-10-CM | POA: Diagnosis present

## 2022-03-21 DIAGNOSIS — J069 Acute upper respiratory infection, unspecified: Principal | ICD-10-CM | POA: Diagnosis present

## 2022-03-21 DIAGNOSIS — Z800 Family history of malignant neoplasm of digestive organs: Secondary | ICD-10-CM

## 2022-03-21 DIAGNOSIS — E860 Dehydration: Secondary | ICD-10-CM | POA: Diagnosis not present

## 2022-03-21 DIAGNOSIS — Z853 Personal history of malignant neoplasm of breast: Secondary | ICD-10-CM

## 2022-03-21 DIAGNOSIS — M79604 Pain in right leg: Secondary | ICD-10-CM | POA: Diagnosis not present

## 2022-03-21 DIAGNOSIS — R1031 Right lower quadrant pain: Secondary | ICD-10-CM

## 2022-03-21 DIAGNOSIS — M160 Bilateral primary osteoarthritis of hip: Secondary | ICD-10-CM | POA: Diagnosis present

## 2022-03-21 DIAGNOSIS — Z881 Allergy status to other antibiotic agents status: Secondary | ICD-10-CM

## 2022-03-21 DIAGNOSIS — R0600 Dyspnea, unspecified: Secondary | ICD-10-CM | POA: Diagnosis present

## 2022-03-21 DIAGNOSIS — E569 Vitamin deficiency, unspecified: Secondary | ICD-10-CM | POA: Diagnosis not present

## 2022-03-21 DIAGNOSIS — M810 Age-related osteoporosis without current pathological fracture: Secondary | ICD-10-CM | POA: Diagnosis present

## 2022-03-21 DIAGNOSIS — Z880 Allergy status to penicillin: Secondary | ICD-10-CM

## 2022-03-21 DIAGNOSIS — Z79899 Other long term (current) drug therapy: Secondary | ICD-10-CM

## 2022-03-21 DIAGNOSIS — R278 Other lack of coordination: Secondary | ICD-10-CM | POA: Diagnosis not present

## 2022-03-21 DIAGNOSIS — E871 Hypo-osmolality and hyponatremia: Secondary | ICD-10-CM | POA: Diagnosis present

## 2022-03-21 DIAGNOSIS — N390 Urinary tract infection, site not specified: Secondary | ICD-10-CM | POA: Insufficient documentation

## 2022-03-21 DIAGNOSIS — H04129 Dry eye syndrome of unspecified lacrimal gland: Secondary | ICD-10-CM | POA: Diagnosis not present

## 2022-03-21 DIAGNOSIS — H9113 Presbycusis, bilateral: Secondary | ICD-10-CM | POA: Diagnosis not present

## 2022-03-21 DIAGNOSIS — M25551 Pain in right hip: Secondary | ICD-10-CM

## 2022-03-21 DIAGNOSIS — N3001 Acute cystitis with hematuria: Secondary | ICD-10-CM | POA: Diagnosis not present

## 2022-03-21 DIAGNOSIS — R2689 Other abnormalities of gait and mobility: Secondary | ICD-10-CM | POA: Diagnosis not present

## 2022-03-21 DIAGNOSIS — Z961 Presence of intraocular lens: Secondary | ICD-10-CM | POA: Diagnosis present

## 2022-03-21 DIAGNOSIS — Z9011 Acquired absence of right breast and nipple: Secondary | ICD-10-CM

## 2022-03-21 LAB — URINALYSIS, ROUTINE W REFLEX MICROSCOPIC
Bilirubin Urine: NEGATIVE
Glucose, UA: NEGATIVE mg/dL
Ketones, ur: 5 mg/dL — AB
Nitrite: NEGATIVE
Protein, ur: >=300 mg/dL — AB
RBC / HPF: >50 RBC/hpf — ABNORMAL HIGH (ref 0–5)
Specific Gravity, Urine: 1.016 (ref 1.005–1.030)
WBC, UA: >50 WBC/hpf — ABNORMAL HIGH (ref 0–5)
pH: 6 (ref 5.0–8.0)

## 2022-03-21 LAB — CBC WITH DIFFERENTIAL/PLATELET
Abs Immature Granulocytes: 0.04 10*3/uL (ref 0.00–0.07)
Basophils Absolute: 0.1 10*3/uL (ref 0.0–0.1)
Basophils Relative: 1 %
Eosinophils Absolute: 0.1 10*3/uL (ref 0.0–0.5)
Eosinophils Relative: 1 %
HCT: 37.1 % (ref 36.0–46.0)
Hemoglobin: 12 g/dL (ref 12.0–15.0)
Immature Granulocytes: 0 %
Lymphocytes Relative: 6 %
Lymphs Abs: 0.7 10*3/uL (ref 0.7–4.0)
MCH: 28.4 pg (ref 26.0–34.0)
MCHC: 32.3 g/dL (ref 30.0–36.0)
MCV: 87.9 fL (ref 80.0–100.0)
Monocytes Absolute: 1.4 10*3/uL — ABNORMAL HIGH (ref 0.1–1.0)
Monocytes Relative: 12 %
Neutro Abs: 8.9 10*3/uL — ABNORMAL HIGH (ref 1.7–7.7)
Neutrophils Relative %: 80 %
Platelets: 305 10*3/uL (ref 150–400)
RBC: 4.22 MIL/uL (ref 3.87–5.11)
RDW: 14.5 % (ref 11.5–15.5)
WBC: 11.2 10*3/uL — ABNORMAL HIGH (ref 4.0–10.5)
nRBC: 0 % (ref 0.0–0.2)

## 2022-03-21 LAB — RESPIRATORY PANEL BY PCR

## 2022-03-21 LAB — POC COVID19 BINAXNOW: SARS Coronavirus 2 Ag: NEGATIVE

## 2022-03-21 LAB — LACTIC ACID, PLASMA: Lactic Acid, Venous: 1.3 mmol/L (ref 0.5–1.9)

## 2022-03-21 LAB — RESP PANEL BY RT-PCR (RSV, FLU A&B, COVID)  RVPGX2
Influenza A by PCR: NEGATIVE
Influenza B by PCR: NEGATIVE
Resp Syncytial Virus by PCR: NEGATIVE
SARS Coronavirus 2 by RT PCR: NEGATIVE

## 2022-03-21 LAB — BASIC METABOLIC PANEL
Anion gap: 12 (ref 5–15)
BUN: 13 mg/dL (ref 8–23)
CO2: 26 mmol/L (ref 22–32)
Calcium: 8.5 mg/dL — ABNORMAL LOW (ref 8.9–10.3)
Chloride: 95 mmol/L — ABNORMAL LOW (ref 98–111)
Creatinine, Ser: 0.51 mg/dL (ref 0.44–1.00)
GFR, Estimated: >60 mL/min (ref >60)
Glucose, Bld: 107 mg/dL — ABNORMAL HIGH (ref 70–99)
Potassium: 3.6 mmol/L (ref 3.5–5.1)
Sodium: 133 mmol/L — ABNORMAL LOW (ref 135–145)

## 2022-03-21 LAB — POCT INFLUENZA A/B
Influenza A, POC: NEGATIVE
Influenza B, POC: NEGATIVE

## 2022-03-21 LAB — POCT RESPIRATORY SYNCYTIAL VIRUS: RSV Rapid Ag: NEGATIVE

## 2022-03-21 MED ORDER — VITAMIN D 25 MCG (1000 UNIT) PO TABS
2000.0000 [IU] | ORAL_TABLET | Freq: Every day | ORAL | Status: DC
  Administered 2022-03-22 – 2022-03-24 (×3): 2000 [IU] via ORAL
  Filled 2022-03-21 (×3): qty 2

## 2022-03-21 MED ORDER — ACETAMINOPHEN 650 MG RE SUPP: 650.0000 mg | Freq: Four times a day (QID) | RECTAL | Status: DC | PRN

## 2022-03-21 MED ORDER — VERAPAMIL HCL ER 180 MG PO TBCR
360.0000 mg | EXTENDED_RELEASE_TABLET | Freq: Every day | ORAL | Status: DC
  Administered 2022-03-21 – 2022-03-23 (×3): 360 mg via ORAL
  Filled 2022-03-21 (×5): qty 2

## 2022-03-21 MED ORDER — ENOXAPARIN SODIUM 40 MG/0.4ML IJ SOSY
40.0000 mg | PREFILLED_SYRINGE | INTRAMUSCULAR | Status: DC
  Administered 2022-03-21 – 2022-03-23 (×3): 40 mg via SUBCUTANEOUS
  Filled 2022-03-21 (×3): qty 0.4

## 2022-03-21 MED ORDER — MAGNESIUM SULFATE 2 GM/50ML IV SOLN
2.0000 g | Freq: Once | INTRAVENOUS | Status: AC
  Administered 2022-03-21: 2 g via INTRAVENOUS
  Filled 2022-03-21: qty 50

## 2022-03-21 MED ORDER — SODIUM CHLORIDE 0.9 % IV SOLN
1.0000 g | INTRAVENOUS | Status: DC
  Administered 2022-03-22 – 2022-03-23 (×2): 1 g via INTRAVENOUS
  Filled 2022-03-21 (×2): qty 10

## 2022-03-21 MED ORDER — ONDANSETRON HCL 4 MG/2ML IJ SOLN: 4.0000 mg | Freq: Four times a day (QID) | INTRAMUSCULAR | Status: DC | PRN

## 2022-03-21 MED ORDER — ATORVASTATIN CALCIUM 40 MG PO TABS
40.0000 mg | ORAL_TABLET | Freq: Every day | ORAL | Status: DC
  Administered 2022-03-22 – 2022-03-24 (×3): 40 mg via ORAL
  Filled 2022-03-21 (×3): qty 1

## 2022-03-21 MED ORDER — SODIUM CHLORIDE 0.9 % IV SOLN
1.0000 g | Freq: Once | INTRAVENOUS | Status: AC
  Administered 2022-03-21: 1 g via INTRAVENOUS
  Filled 2022-03-21: qty 10

## 2022-03-21 MED ORDER — BENZONATATE 100 MG PO CAPS
100.0000 mg | ORAL_CAPSULE | Freq: Once | ORAL | Status: AC
  Administered 2022-03-21: 100 mg via ORAL
  Filled 2022-03-21: qty 1

## 2022-03-21 MED ORDER — ACETAMINOPHEN 325 MG PO TABS
650.0000 mg | ORAL_TABLET | Freq: Four times a day (QID) | ORAL | Status: DC | PRN
  Administered 2022-03-22: 650 mg via ORAL
  Filled 2022-03-21: qty 2

## 2022-03-21 MED ORDER — POTASSIUM CHLORIDE CRYS ER 20 MEQ PO TBCR
20.0000 meq | EXTENDED_RELEASE_TABLET | Freq: Once | ORAL | Status: AC
  Administered 2022-03-21: 20 meq via ORAL
  Filled 2022-03-21: qty 1

## 2022-03-21 MED ORDER — ACETAMINOPHEN 500 MG PO TABS
1000.0000 mg | ORAL_TABLET | Freq: Once | ORAL | Status: AC
  Administered 2022-03-21: 1000 mg via ORAL
  Filled 2022-03-21: qty 2

## 2022-03-21 MED ORDER — ONDANSETRON HCL 4 MG PO TABS: 4.0000 mg | ORAL_TABLET | Freq: Four times a day (QID) | ORAL | Status: DC | PRN

## 2022-03-21 MED ORDER — LEVALBUTEROL HCL 0.63 MG/3ML IN NEBU
0.6300 mg | INHALATION_SOLUTION | Freq: Four times a day (QID) | RESPIRATORY_TRACT | Status: DC
  Administered 2022-03-21 – 2022-03-22 (×2): 0.63 mg via RESPIRATORY_TRACT
  Filled 2022-03-21 (×2): qty 3

## 2022-03-21 MED ORDER — SODIUM CHLORIDE 0.9 % IV SOLN: INTRAVENOUS | Status: AC

## 2022-03-21 MED ORDER — BUDESONIDE 0.5 MG/2ML IN SUSP
0.5000 mg | Freq: Two times a day (BID) | RESPIRATORY_TRACT | Status: DC
  Administered 2022-03-21 – 2022-03-24 (×5): 0.5 mg via RESPIRATORY_TRACT
  Filled 2022-03-21 (×7): qty 2

## 2022-03-21 NOTE — Progress Notes (Signed)
Subjective:  Mary [name] is a 89 y.o. female who presents for Chief Complaint  Patient presents with   other    Started last Wednesday started coughing, has laryngitis now, x-rays were normal, coughing colored phlegm, rt. Leg and hip pain through dec. Negative for covid/flu and rsv on 03/19/22, have some SOB with mask on,      Here with her daughter Mardene Celeste from [place], [place] for not feeling well, weak.  Was seen at Emergency Dept 03/19/22, initially negative for flu, covid and RSV.  Started 6 days ago with cough, but has progressively worsened.   Last week had laryngitis, worsening cough, some SOB, feeling weak.   No body aches or chills.  No fever so far.  No NVD.   Does feel SOB.  Was up more last night than asleep.    Kept coughing all night.  Using Robitussin.  Getting some mucous up now.   Typically she is independent with ADLs, but can't get in and out of bed by herself.   Has some pains in right right groin, hx/o arthritis in right knee and has been getting some treatment by orthopedics.  No recent fall.        No hx/o lung disease or asth no hx/o heart disease.  Compliant with medications  Normally drives herself here but had to be assisted out of the car today with wheelchair.  No other aggravating or relieving factors.    No other c/o.  Past Medical History:  Diagnosis Date   Alopecia    Ashkenazi Jewish ancestry    Cancer [facility])    External hemorrhoid    Foley catheter in place    Heart palpitations no cardiologist--  monitored by pcp   per pt "has been on verapamil and lanoxin for years and no palpitations for a very long time"   History of acute bronchitis    dx 12-21-2014  finished [facility]   History of adenomatous polyp of colon    2003   History of basal cell carcinoma excision    2006  left nasolabial fold   History of benign colon tumor    2007  --  HIGH GRADE HYPERPLASTIC ,  S/P  LEFT HEMICOLECTOMY   History of breast cancer ONCOLOGIST-  DR [name]--   antiestogen therapy with femera completed 12/  2012--  no recurrence   dx 10/  2007  right breast Invasive DCIS, grade 2, Stage 2A, pT2  pN0 pMX,  (ER 100% /PR 3% +),  HER +3) with 1 metastatic axill node---  s/p  right mastectomy    History of kidney stones    Hypertension    Hypotonic bladder UROLOGIST-  DR [name]   Mixed hyperlipidemia    OA (osteoarthritis)    hip   Osteoporosis    DEXA 01/2009; T-3.2 L fem neck, -2.4 L radius   Urinary retention    02-13-2015   Current Outpatient Medications on File Prior to Visit  Medication Sig Dispense Refill   atorvastatin (LIPITOR) 40 MG tablet Take 1 tablet (40 mg total) by mouth daily. 90 tablet 1   Calcium Carbonate-Vitamin D (CALCIUM 600+D PO) Take 1 tablet by mouth daily.     Cholecalciferol (VITAMIN D3) 50 MCG (2000 UT) TABS Take 2,000 Units by mouth daily. 100 tablet 4   denosumab (PROLIA) 60 MG/ML SOLN injection Inject 60 mg into the skin every 6 (six) months. Administer in upper arm, thigh, or abdomen     fluticasone (CUTIVATE) 0.005 %  ointment Apply 1 Application topically 2 (two) times daily.     Multiple Vitamins-Minerals (PRESERVISION AREDS 2) CAPS Take 1 capsule by mouth 2 (two) times daily.     Polyvinyl Alcohol-Povidone (REFRESH OP) Place 1 drop into both eyes 2 (two) times daily.     verapamil (VERELAN PM) 360 MG 24 hr capsule Take 1 capsule (360 mg total) by mouth at bedtime. 90 capsule 1   acetaminophen (TYLENOL) 500 MG tablet Take 1,000 mg by mouth 3 (three) times daily. (Patient not taking: Reported on 03/21/2022)     bisacodyl (DULCOLAX) 5 MG EC tablet Take 5 mg by mouth daily as needed for moderate constipation. (Patient not taking: Reported on 03/21/2022)     clindamycin (CLEOCIN) 150 MG capsule Take 600 mg by mouth See admin instructions. Take 600 mg 1 hour prior to dental work (Patient not taking: Reported on 01/16/2022)     hydrocortisone (ANUSOL-HC) 2.5 % rectal cream Place 1 application. rectally 2 (two) times daily.  (Patient not taking: Reported on 01/16/2022) 30 g 1   ibuprofen (ADVIL) 200 MG tablet Take 200 mg by mouth every 6 (six) hours as needed for moderate pain. (Patient not taking: Reported on 01/16/2022)     loratadine (CLARITIN) 10 MG tablet Take 10 mg by mouth daily as needed for allergies. (Patient not taking: Reported on 01/16/2022)     polyethylene glycol (MIRALAX / GLYCOLAX) packet Take 17 g by mouth daily as needed for moderate constipation. (Patient not taking: Reported on 03/21/2022)     VALIUM 2 MG tablet Take 1 tablet (2 mg total) by mouth every 8 (eight) hours as needed for muscle spasms. (Patient not taking: Reported on 01/16/2022) 30 tablet 0   No current facility-administered medications on file prior to visit.    The following portions of the patient's history were reviewed and updated as appropriate: allergies, current medications, past family history, past medical history, past social history, past surgical history and problem list.  ROS Otherwise as in subjective above    Objective: BP 130/84   Pulse (!) 59   Temp 99.2 F (37.3 C)   Resp (!) 24   LMP  (LMP Unknown)   SpO2 94%  Wt Readings from Last 3 Encounters:  01/16/22 123 lb 6.4 oz (56 kg)  09/06/21 120 lb (54.4 kg)  08/30/21 120 lb (54.4 kg)   General appearance: alert, no distress, ill and weak appearing HEENT: normocephalic, sclerae anicteric, conjunctiva pink and moist, nares patent, no discharge or erythema, pharynx normal Oral cavity: dry MM, no lesions Neck: supple, no lymphadenopathy, no thyromegaly, no masses, no obvious JVD Heart: somewhat irregular at times, no murmurs Lungs: somewhat decreases sounds in general, dullness in right lower field, normal egophony, no wheezes, rhonchi, or rales Abdomen: +bs, soft, non tender, non distended, no obvious masses, no hepatomegaly, no splenomegaly Pulses: 2+ radial pulses, 2+ pedal pulses, normal cap refill Ext: no edema MSK: right hip tender and seems to be in  pain with right hip ROM, right knee with bony arthritis changes, otherwise legs nontender Psych: pleasant,  but seems weak and ill, somewhat lethargic at times    Assessment: Encounter Diagnoses  Name Primary?   Acute cough Yes   Respiratory tract infection    Weakness    Right inguinal pain    Right leg pain    Dehydration      Plan: I discussed the symptoms and findings with her and her daughter Mardene Celeste who is here from out of  state from [place]  This pleasant lady is normally independent but she is weak and had to be helped out of the car today.  She was seen initially on December 31 at the emergency department but in her own words she has got much worse over the last few days.  She is tachypneic today, coughing, decreased oxygenation, fatigue and weak and dehydrated  She was negative for flu COVID and RSV today.  EKG today shows some PACs, new changes in 3 suggesting possible septal infarct age undetermined  Needs to be ruled out for pneumonia with chest x-ray, needs further evaluation and treatment.  We discussed her options for care and family opts for EMS transport to local hospital which is appropriate   Signora was seen today for other.  Diagnoses and all orders for this visit:  Acute cough  Respiratory tract infection  Weakness  Right inguinal pain  Right leg pain  Dehydration    Follow up: sent to hospital ED by EM

## 2022-03-21 NOTE — H&P (Addendum)
History and Physical    Patient: Mary [name] GNF:[id number] DOB: [birth year] DOA: 03/21/2022 DOS: the patient was seen and examined on 03/21/2022 PCP: Rita Ohara, MD  Patient coming from: Home  Chief Complaint:  Chief Complaint  Patient presents with   Cough   Shortness of Breath   HPI: Mary [name] is a 89 y.o. female with medical history significant of alopecia, [place] Jewish ancestry, breast cancer, colon polyps, hypotonic bladder, suprapubic catheter cholelithiasis, hypertension, hyperlipidemia, osteoarthritis, osteoporosis who is coming with worsening of cough and dyspnea after she got URI symptoms about 5 to 6 days ago.  No sick contacts according to the patient.  She has a hoarse voice, improving sore throat, rhinorrhea, decreased appetite and difficulty sleeping because of cough.  No flank pain or suprapubic tenderness.  No chest pain, palpitations, diaphoresis, PND, orthopnea or pitting edema of the lower extremities.  No appetite changes, abdominal pain, diarrhea, constipation, melena or hematochezia. No polyuria, polydipsia, polyphagia or blurred vision.  ED course: Initial vital signs were temperature 99 F, pulse 64, respirations 24, BP 154/96 mmHg O2 sat 94% on room air.  The patient received ceftriaxone 1 g IVPB, Tessalon Perles 100 mg p.o and acetaminophen 1000 mg p.o. x 1.  Lab work: Her urinalysis showed large leukocyte esterase, more than 50 RBC, more than 50 WBC and many bacteria, but the sample is from catheter.  Coronavirus and influenza PCR negative.  RSV PCR negative.  CBC showed a white count of 11.2 with 80% neutrophils, hemoglobin 12.0 g/dL platelets 305.  Lactic acid was normal.  EKG with sinus arrhythmia.  Imaging: 2 view chest radiograph with no acute cardiopulmonary process.  Right hip x-ray with severe femoroacetabular osteoarthritis and moderate to severe arthritis of the pubic symphysis.   Review of Systems: As mentioned in the history of present illness. All  other systems reviewed and are negative.  Past Medical History:  Diagnosis Date   Alopecia    Ashkenazi Jewish ancestry    Cancer [facility])    External hemorrhoid    Foley catheter in place    Heart palpitations no cardiologist--  monitored by pcp   per pt "has been on verapamil and lanoxin for years and no palpitations for a very long time"   History of acute bronchitis    dx 12-21-2014  finished [facility]   History of adenomatous polyp of colon    2003   History of basal cell carcinoma excision    2006  left nasolabial fold   History of benign colon tumor    2007  --  HIGH GRADE HYPERPLASTIC ,  S/P  LEFT HEMICOLECTOMY   History of breast cancer ONCOLOGIST-  DR [name]--  antiestogen therapy with femera completed 12/  2012--  no recurrence   dx 10/  2007  right breast Invasive DCIS, grade 2, Stage 2A, pT2  pN0 pMX,  (ER 100% /PR 3% +),  HER +3) with 1 metastatic axill node---  s/p  right mastectomy    History of kidney stones    Hypertension    Hypotonic bladder UROLOGIST-  DR [name]   Mixed hyperlipidemia    OA (osteoarthritis)    hip   Osteoporosis    DEXA 01/2009; T-3.2 L fem neck, -2.4 L radius   Urinary retention    02-13-2015   Past Surgical History:  Procedure Laterality Date   APPENDECTOMY  age 59   BREAST LUMPECTOMY WITH NEEDLE LOCALIZATION AND AXILLARY SENTINEL LYMPH NODE BX Right 01-23-2006   CARPAL  TUNNEL RELEASE Right 04-24-2007   and Pulley Release index and small finger   CATARACT EXTRACTION W/ INTRAOCULAR LENS  IMPLANT, BILATERAL  left 1991  &  right 1993   COLONOSCOPY  last one 2013   CYSTOSCOPY N/A 07/12/2015   Procedure: CYSTOSCOPY;  Surgeon: [name], MD;  [facility];  Service: Urology;  Laterality: N/A;   CYSTOSCOPY WITH BIOPSY N/A 03/01/2015   Procedure: CYSTOSCOPY WITH TAUBER BIOPSY OF 1CM RIGHT LATERAL BLADDER WALL AND CAUTERIZATION OF BIOPSY SITE ;  Surgeon: [name], MD;  Location: [place];  Service: Urology;  Laterality: N/A;   CYSTOSCOPY WITH LITHOLAPAXY N/A 09/06/2021   Procedure: CYSTOSCOPY WITH LITHOLAPAXY < 2cm, HOLMIUM LASER LITHOTRIPSY, BILATERAL RETROGRADE PYELOGRAM, SUPRAPUBIC CATHETER EXCHANGE;  Surgeon: Festus Aloe, MD;  Location: [facility];  Service: Urology;  Laterality: N/A;  ONLY NEEDS 90 MIN   INSERTION OF SUPRAPUBIC CATHETER N/A 07/12/2015   Procedure: INSERTION OF SUPRAPUBIC CATHETER;  Surgeon: [name], MD;  Location: [place];  Service: Urology;  Laterality: N/A;   LAPAROSCOPIC ASSISTED LEFT HEMICOLECTOMY  06/  2007   high grade hyperplastic mass   MASTECTOMY Right 02-28-2006   PARTIAL KNEE ARTHROPLASTY Right 01/24/2013   Procedure: RIGHT KNEE MEDIAL UNICOMPARTMENTAL ARTHROPLASTY;  Surgeon: Gearlean Alf, MD;  Location: [facility];  Service: Orthopedics;  Laterality: Right;   PULLEY RELEASE LEFT INDEX AND SMALL FINGER  08-07-2007   TONSILLECTOMY  age 33   Social History:  reports that she has never smoked. She has never used smokeless tobacco. She reports that she does not drink alcohol and does not use drugs.  Allergies  Allergen Reactions   Arimidex [Anastrozole] Rash   Avelox [Moxifloxacin Hcl In Nacl] Nausea Only and Rash   Azithromycin Nausea Only and Rash   Bactrim Nausea Only and Rash   Biaxin [Clarithromycin] Nausea Only and Rash   Nitrofurantoin Nausea Only, Rash and Other (See Comments)   Penicillins Nausea Only and Rash    Has patient had a PCN reaction causing immediate rash, facial/tongue/throat swelling, SOB or lightheadedness with hypotension: no Has patient had a PCN reaction causing severe rash involving mucus membranes or skin necrosis: no Has patient had a PCN reaction that required hospitalization no Has patient had a PCN reaction occurring within the last 10 years no If all of the above answers are "NO", then may proceed with Cephalosporin use.    Sulfa Antibiotics Nausea And Vomiting and Rash    Sulfur Nausea And Vomiting and Rash    Family History  Problem Relation Age of Onset   Hypertension Mother    Stroke Father    Diabetes Father    Hyperlipidemia Brother    Atrial fibrillation Brother    Hyperlipidemia Son    Breast cancer Paternal Aunt        dx 20s; deceased early 72s   Breast cancer Cousin        paternal first cousin   Colon cancer Cousin        paternal first cousin; age dx 42s    Prior to Admission medications   Medication Sig Start Date End Date Taking? Authorizing Provider  acetaminophen (TYLENOL) 500 MG tablet Take 1,000 mg by mouth 3 (three) times daily. Patient not taking: Reported on 03/21/2022    [provider]  atorvastatin (LIPITOR) 40 MG tablet Take 1 tablet (40 mg total) by mouth daily. 01/16/22   Rita Ohara, MD  bisacodyl (DULCOLAX) 5 MG EC  tablet Take 5 mg by mouth daily as needed for moderate constipation. Patient not taking: Reported on 03/21/2022    [provider]  Calcium Carbonate-Vitamin D (CALCIUM 600+D PO) Take 1 tablet by mouth daily.    [provider]  Cholecalciferol (VITAMIN D3) 50 MCG (2000 UT) TABS Take 2,000 Units by mouth daily. 08/03/20   Magrinat, Virgie Dad, MD  clindamycin (CLEOCIN) 150 MG capsule Take 600 mg by mouth See admin instructions. Take 600 mg 1 hour prior to dental work Patient not taking: Reported on 01/16/2022    [provider]  denosumab (PROLIA) 60 MG/ML SOLN injection Inject 60 mg into the skin every 6 (six) months. Administer in upper arm, thigh, or abdomen 10/24/12   [provider]  fluticasone (CUTIVATE) 0.005 % ointment Apply 1 Application topically 2 (two) times daily.    [provider]  hydrocortisone (ANUSOL-HC) 2.5 % rectal cream Place 1 application. rectally 2 (two) times daily. Patient not taking: Reported on 01/16/2022 07/18/21   Rita Ohara, MD  ibuprofen (ADVIL) 200 MG tablet Take 200 mg by mouth every 6 (six) hours as needed for moderate pain. Patient  not taking: Reported on 01/16/2022    [provider]  loratadine (CLARITIN) 10 MG tablet Take 10 mg by mouth daily as needed for allergies. Patient not taking: Reported on 01/16/2022    [provider]  Multiple Vitamins-Minerals (PRESERVISION AREDS 2) CAPS Take 1 capsule by mouth 2 (two) times daily.    [provider]  polyethylene glycol (MIRALAX / GLYCOLAX) packet Take 17 g by mouth daily as needed for moderate constipation. Patient not taking: Reported on 03/21/2022    [provider]  Polyvinyl Alcohol-Povidone (REFRESH OP) Place 1 drop into both eyes 2 (two) times daily.    [provider]  VALIUM 2 MG tablet Take 1 tablet (2 mg total) by mouth every 8 (eight) hours as needed for muscle spasms. Patient not taking: Reported on 01/16/2022 12/08/21   Rita Ohara, MD  verapamil (VERELAN PM) 360 MG 24 hr capsule Take 1 capsule (360 mg total) by mouth at bedtime. 01/16/22   Rita Ohara, MD    Physical Exam: Vitals:   03/21/22 1557 03/21/22 1600 03/21/22 1700 03/21/22 1730  BP: (!) 152/9 (!) 152/99 (!) 154/89 (!) 178/156  Pulse: 86 100 (!) 108 92  Resp: (!) 30 (!) 28 (!) 25 (!) 28  Temp: 98.5 F (36.9 C)     TempSrc:      SpO2: 97% 97% 96% 96%   Physical Exam Vitals and nursing note reviewed.  Constitutional:      General: She is awake. She is not in acute distress.    Appearance: She is well-developed.  HENT:     Head: Normocephalic.     Nose: No rhinorrhea.     Mouth/Throat:     Mouth: Mucous membranes are dry.  Eyes:     General: No scleral icterus.    Pupils: Pupils are equal, round, and reactive to light.  Neck:     Vascular: No JVD.  Cardiovascular:     Rate and Rhythm: Normal rate and regular rhythm. Frequent Extrasystoles are present.    Heart sounds: S1 normal and S2 normal.  Pulmonary:     Effort: Tachypnea present. No accessory muscle usage.     Breath sounds: Rhonchi present. No wheezing or rales.  Abdominal:      General: Bowel sounds are normal. There is no distension.  Palpations: Abdomen is soft.     Tenderness: There is no abdominal tenderness. There is no right CVA tenderness or left CVA tenderness.  Musculoskeletal:     Cervical back: Neck supple.     Right lower leg: No edema.     Left lower leg: No edema.  Skin:    General: Skin is warm and dry.  Neurological:     General: No focal deficit present.     Mental Status: She is alert and oriented to person, place, and time.  Psychiatric:        Mood and Affect: Mood is anxious.        Behavior: Behavior is cooperative.   Data Reviewed: { Results are pending, will review when available.  Assessment and Plan: Principal Problem:   URI (upper respiratory infection) Admit to PCU/inpatient. Continue supplemental oxygen. Scheduled and as needed bronchodilators. Continue symptomatic treatment. Inhaled budesonide. Check procalcitonin level. Follow respiratory pathogen. Follow-up blood culture and sensitivity. Follow-up CBC and chemistry in the morning.  Active Problems:   Mixed hyperlipidemia Continue atorvastatin 40 mg p.o. daily.    Essential hypertension, benign Continue verapamil 360 mg p.o. nightly.    Hyponatremia Gentle NS hydration. Follow sodium level.    Hypocalcemia Recheck calcium level in AM.    Asymptomatic bacteriuria Will defer further antibacterial. Follow-up culture and sensitivity.    Sinus arrhythmia Optimize electrolytes. Xopenex for bronchodilation.     Advance Care Planning:   Code Status: Full Code   Consults:   Family Communication: Her daughter was at bedside.  Severity of Illness: The appropriate patient status for this patient is INPATIENT. Inpatient status is judged to be reasonable and necessary in order to provide the required intensity of service to ensure the patient's safety. The patient's presenting symptoms, physical exam findings, and initial radiographic and laboratory data in  the context of their chronic comorbidities is felt to place them at high risk for further clinical deterioration. Furthermore, it is not anticipated that the patient will be medically stable for discharge from the hospital within 2 midnights of admission.   * I certify that at the point of admission it is my clinical judgment that the patient will require inpatient hospital care spanning beyond 2 midnights from the point of admission due to high intensity of service, high risk for further deterioration and high frequency of surveillance required.*  Author: Reubin Milan, MD 03/21/2022 6:49 PM  For on call review [email or web address].   This document was prepared using Dragon voice recognition software and may contain some unintended transcription errors.

## 2022-03-21 NOTE — ED Triage Notes (Signed)
Per EMS- patient is coming from a Dr.'s office. Patient reports that she was seen at [facility] 2-3 days ago for a cough, weakness, and SOB. Patient states her symptoms have worsened.

## 2022-03-21 NOTE — ED Notes (Signed)
Called lab to add on urine culture ?

## 2022-03-21 NOTE — Plan of Care (Signed)
Receiving IVFs at this moment and patient has a suprapubic catheter

## 2022-03-21 NOTE — ED Provider Notes (Signed)
[facility] DEPT Provider Note   CSN: [id number] Arrival date & time: 03/21/22  1346     History  Chief Complaint  Patient presents with   Cough   Shortness of Breath    Mary [name] is a 89 y.o. female.  The history is provided by the patient and medical records. No language interpreter was used.     89 year old female significant history of breast cancer, hypertension, brought here via EMS from PCP office with complaints of weakness.  Patient report for the past week she has had persistent cough.  Cough is productive with phlegm.  She endorsed generalized weakness, having trouble getting out of her chair due to weakness and osteoarthritis of her hips and knee.  She is having trouble sleeping and she is losing her voice due to persistent coughing.  She does not endorse any fever chills or bodyaches.  She denies any nausea vomiting diarrhea but does endorse some shortness of breath.  She denies any new medication changes.  She was seen at urgent care center today but sent here for further assessment of her condition.  Patient lives at home by herself but does have family members that check on her regularly.  Home Medications Prior to Admission medications   Medication Sig Start Date End Date Taking? Authorizing Provider  acetaminophen (TYLENOL) 500 MG tablet Take 1,000 mg by mouth 3 (three) times daily. Patient not taking: Reported on 03/21/2022    [provider]  atorvastatin (LIPITOR) 40 MG tablet Take 1 tablet (40 mg total) by mouth daily. 01/16/22   Rita Ohara, MD  bisacodyl (DULCOLAX) 5 MG EC tablet Take 5 mg by mouth daily as needed for moderate constipation. Patient not taking: Reported on 03/21/2022    [provider]  Calcium Carbonate-Vitamin D (CALCIUM 600+D PO) Take 1 tablet by mouth daily.    [provider]  Cholecalciferol (VITAMIN D3) 50 MCG (2000 UT) TABS Take 2,000 Units by mouth daily. 08/03/20   Magrinat, Virgie Dad,  MD  clindamycin (CLEOCIN) 150 MG capsule Take 600 mg by mouth See admin instructions. Take 600 mg 1 hour prior to dental work Patient not taking: Reported on 01/16/2022    [provider]  denosumab (PROLIA) 60 MG/ML SOLN injection Inject 60 mg into the skin every 6 (six) months. Administer in upper arm, thigh, or abdomen 10/24/12   [provider]  fluticasone (CUTIVATE) 0.005 % ointment Apply 1 Application topically 2 (two) times daily.    [provider]  hydrocortisone (ANUSOL-HC) 2.5 % rectal cream Place 1 application. rectally 2 (two) times daily. Patient not taking: Reported on 01/16/2022 07/18/21   Rita Ohara, MD  ibuprofen (ADVIL) 200 MG tablet Take 200 mg by mouth every 6 (six) hours as needed for moderate pain. Patient not taking: Reported on 01/16/2022    [provider]  loratadine (CLARITIN) 10 MG tablet Take 10 mg by mouth daily as needed for allergies. Patient not taking: Reported on 01/16/2022    [provider]  Multiple Vitamins-Minerals (PRESERVISION AREDS 2) CAPS Take 1 capsule by mouth 2 (two) times daily.    [provider]  polyethylene glycol (MIRALAX / GLYCOLAX) packet Take 17 g by mouth daily as needed for moderate constipation. Patient not taking: Reported on 03/21/2022    [provider]  Polyvinyl Alcohol-Povidone (REFRESH OP) Place 1 drop into both eyes 2 (two) times daily.    [provider]  VALIUM 2 MG tablet Take 1 tablet (  2 mg total) by mouth every 8 (eight) hours as needed for muscle spasms. Patient not taking: Reported on 01/16/2022 12/08/21   Rita Ohara, MD  verapamil (VERELAN PM) 360 MG 24 hr capsule Take 1 capsule (360 mg total) by mouth at bedtime. 01/16/22   Rita Ohara, MD      Allergies    Arimidex [anastrozole], Avelox [moxifloxacin hcl in nacl], Azithromycin, Bactrim, Biaxin [clarithromycin], Nitrofurantoin, Penicillins, Sulfa antibiotics, and Sulfur    Review of Systems   Review of  Systems  All other systems reviewed and are negative.   Physical Exam Updated Vital Signs BP (!) 152/99   Pulse 100   Temp 98.5 F (36.9 C)   Resp (!) 28   LMP  (LMP Unknown)   SpO2 97%  Physical Exam Vitals and nursing note reviewed.  Constitutional:      General: She is not in acute distress.    Appearance: She is well-developed.  HENT:     Head: Atraumatic.  Eyes:     Conjunctiva/sclera: Conjunctivae normal.  Cardiovascular:     Rate and Rhythm: Tachycardia present.     Pulses: Normal pulses.     Heart sounds: Normal heart sounds.  Pulmonary:     Effort: Pulmonary effort is normal.     Breath sounds: Rhonchi present. No wheezing or rales.  Abdominal:     Palpations: Abdomen is soft.  Musculoskeletal:     Cervical back: Neck supple.     Right lower leg: No edema.     Left lower leg: No edema.     Comments: Globally weak but equal strength throughout.  Skin:    Findings: No rash.  Neurological:     Mental Status: She is alert.  Psychiatric:        Mood and Affect: Mood normal.     ED Results / Procedures / Treatments   Labs (all labs ordered are listed, but only abnormal results are displayed) Labs Reviewed  BASIC METABOLIC PANEL - Abnormal; Notable for the following components:      Result Value   Sodium 133 (*)    Chloride 95 (*)    Glucose, Bld 107 (*)    Calcium 8.5 (*)    All other components within normal limits  CBC WITH DIFFERENTIAL/PLATELET - Abnormal; Notable for the following components:   WBC 11.2 (*)    Neutro Abs 8.9 (*)    Monocytes Absolute 1.4 (*)    All other components within normal limits  URINALYSIS, ROUTINE W REFLEX MICROSCOPIC - Abnormal; Notable for the following components:   APPearance TURBID (*)    Hgb urine dipstick SMALL (*)    Ketones, ur 5 (*)    Protein, ur >=300 (*)    Leukocytes,Ua LARGE (*)    RBC / HPF >50 (*)    WBC, UA >50 (*)    Bacteria, UA MANY (*)    All other components within normal limits  RESP PANEL  BY RT-PCR (RSV, FLU A&B, COVID)  [id number]  RESPIRATORY PANEL BY PCR  URINE CULTURE  LACTIC ACID, PLASMA  LACTIC ACID, PLASMA    EKG EKG Interpretation  Date/Time:  Tuesday March 21 2022 14:18:52 EST Ventricular Rate:  108 PR Interval:  107 QRS Duration: 125 QT Interval:  326 QTC Calculation: 437 R Axis:   -52 Text Interpretation: Sinus tachycardia with irregular rate RBBB and LAFB Confirmed by Dene Gentry [phone]) on 03/21/2022 2:44:01 PM  Radiology DG Chest 2 View  Result Date: 03/19/2022 CLINICAL  DATA:  Cough. EXAM: CHEST - 2 VIEW COMPARISON:  Chest radiographs 03/23/2015 in 01/16/2013 FINDINGS: Cardiac silhouette and mediastinal contours are unchanged and within normal limits. There are again extensive anterior costochondral calcifications. Findings diaphragms and increased lucencies in the bilateral lungs consistent with the emphysematous changes. No acute lung airspace opacity. Left nipple shadow overlies left costophrenic angle. No pleural effusion pneumothorax. Moderate to severe multilevel disc space narrowing, endplate sclerosis, and anterior endplate osteophytes on lateral view. IMPRESSION: 1. No acute cardiopulmonary process. 2. Chronic emphysematous changes. 3. Moderate to severe multilevel degenerative disc disease. Electronically Signed   By: Yvonne Kendall M.D.   On: 03/19/2022 16:13    Procedures Procedures    Medications Ordered in ED Medications  acetaminophen (TYLENOL) tablet 1,000 mg (1,000 mg Oral Given 03/21/22 1450)  benzonatate (TESSALON) capsule 100 mg (100 mg Oral Given 03/21/22 1657)  cefTRIAXone (ROCEPHIN) 1 g in sodium chloride 0.9 % 100 mL IVPB (1 g Intravenous New Bag/Given 03/21/22 1656)    ED Course/ Medical Decision Making/ A&P                           Medical Decision Making Amount and/or Complexity of Data Reviewed Labs: ordered. Radiology: ordered.  Risk OTC drugs. Prescription drug management. Decision regarding hospitalization.   BP  (!) 178/156   Pulse 92   Temp 98.5 F (36.9 C)   Resp (!) 28   LMP  (LMP Unknown)   SpO2 96%   24:61 PM 89 year old female significant history of breast cancer, hypertension, brought here via EMS from PCP office with complaints of weakness.  Patient report for the past week she has had persistent cough.  Cough is productive with phlegm.  She endorsed generalized weakness, having trouble getting out of her chair due to weakness and osteoarthritis of her hips and knee.  She is having trouble sleeping and she is losing her voice due to persistent coughing.  She does not endorse any fever chills or bodyaches.  She denies any nausea vomiting diarrhea but does endorse some shortness of breath.  She denies any new medication changes.  She was seen at urgent care center today but sent here for further assessment of her condition.  Patient lives at home by herself but does have family members that check on her regularly.  On exam this is an elderly female sitting in wheelchair actively coughing.  She has diminished breath sounds but no wheezes or crackles heard.  She does not appear to be fluid overloaded.  She has global weakness but equal strength throughout.  She is mentating appropriately.  Workup initiated.  4:59 PM Workup today is remarkable for urinalysis shows turbid appearance, large leukocyte esterase and many bacteria consistent with a UTI.  Patient does endorse some urinary discomfort therefore I will treat symptoms with Rocephin.  Urine culture sent.  She has an elevated white count of 11.2 likely in the setting of infection.  Electrolyte panel is otherwise reassuring.  COVID flu and RSV test came back negative, chest x-ray obtained independently viewed interpreted by me and are negative for focal infiltrate.  Patient's daughter is in the room at bedside and was able to provide additional information.  Daughter voiced concerns that patient has been complaining of right hip pain more than usual.   She does have history of osteoarthritis and she denies any recent falls or however due to worsening pain and difficulty ambulating, will obtain right hip and pelvis  x-ray for further assessment.  6:05 PM Appreciate consultation from [facility] hospitalist, Dr. Olevia Bowens who agrees to see and will admit patient for further care.  -Labs ordered, independently viewed and interpreted by me.  Labs remarkable for UA showing UTI, normal lactic acid, WBC 11.2 likely in the setting of UTI. -The patient was maintained on a cardiac monitor.  I personally viewed and interpreted the cardiac monitored which showed an underlying rhythm of: sinus tachycardia -Imaging independently viewed and interpreted by me and I agree with radiologist's interpretation.  Result remarkable for CXR without focal infiltrates -This patient presents to the ED for concern of weakness, this involves an extensive number of treatment options, and is a complaint that carries with it a high risk of complications and morbidity.  The differential diagnosis includes viral illness, UTI, pna, electrolytes derangement, anemia -Co morbidities that complicate the patient evaluation includes osteoporosis, HTN, CA -Treatment includes rocephin, cough medication -Reevaluation of the patient after these medicines showed that the patient improved -PCP office notes or outside notes reviewed -Discussion with specialist [facility]ist Dr. Everlena Cooper who agrees to admit pt -Escalation to admission/observation considered: patient is agreeable with admission.         Final Clinical Impression(s) / ED Diagnoses Final diagnoses:  Viral URI with cough  Acute cystitis with hematuria  Right hip pain    Rx / DC Orders ED Discharge Orders     None         Domenic Moras, PA-C 03/21/22 1808    Valarie Merino, MD 03/25/22 [phone]

## 2022-03-21 NOTE — Addendum Note (Signed)
Addended byLinus Salmons, Shahmeer Bunn D on: 03/21/2022 01:31 PM   Modules accepted: Orders

## 2022-03-22 ENCOUNTER — Encounter (HOSPITAL_COMMUNITY): Payer: Self-pay | Admitting: Internal Medicine

## 2022-03-22 ENCOUNTER — Other Ambulatory Visit: Payer: Self-pay

## 2022-03-22 DIAGNOSIS — I10 Essential (primary) hypertension: Secondary | ICD-10-CM

## 2022-03-22 DIAGNOSIS — R5381 Other malaise: Secondary | ICD-10-CM

## 2022-03-22 DIAGNOSIS — E871 Hypo-osmolality and hyponatremia: Secondary | ICD-10-CM

## 2022-03-22 LAB — URINALYSIS, ROUTINE W REFLEX MICROSCOPIC
Bacteria, UA: NONE SEEN
Bilirubin Urine: NEGATIVE
Glucose, UA: NEGATIVE mg/dL
Ketones, ur: 5 mg/dL — AB
Nitrite: NEGATIVE
Protein, ur: 100 mg/dL — AB
Specific Gravity, Urine: 1.015 (ref 1.005–1.030)
WBC, UA: >50 WBC/hpf — ABNORMAL HIGH (ref 0–5)
pH: 5 (ref 5.0–8.0)

## 2022-03-22 LAB — COMPREHENSIVE METABOLIC PANEL
ALT: 19 U/L (ref 0–44)
AST: 22 U/L (ref 15–41)
Albumin: 3.2 g/dL — ABNORMAL LOW (ref 3.5–5.0)
Alkaline Phosphatase: 88 U/L (ref 38–126)
Anion gap: 9 (ref 5–15)
BUN: 13 mg/dL (ref 8–23)
CO2: 28 mmol/L (ref 22–32)
Calcium: 7.9 mg/dL — ABNORMAL LOW (ref 8.9–10.3)
Chloride: 98 mmol/L (ref 98–111)
Creatinine, Ser: 0.53 mg/dL (ref 0.44–1.00)
GFR, Estimated: >60 mL/min (ref >60)
Glucose, Bld: 110 mg/dL — ABNORMAL HIGH (ref 70–99)
Potassium: 3.4 mmol/L — ABNORMAL LOW (ref 3.5–5.1)
Sodium: 135 mmol/L (ref 135–145)
Total Bilirubin: 0.9 mg/dL (ref 0.3–1.2)
Total Protein: 6.2 g/dL — ABNORMAL LOW (ref 6.5–8.1)

## 2022-03-22 LAB — LACTIC ACID, PLASMA: Lactic Acid, Venous: 0.9 mmol/L (ref 0.5–1.9)

## 2022-03-22 LAB — CBC
HCT: 37 % (ref 36.0–46.0)
Hemoglobin: 11.8 g/dL — ABNORMAL LOW (ref 12.0–15.0)
MCH: 28.2 pg (ref 26.0–34.0)
MCHC: 31.9 g/dL (ref 30.0–36.0)
MCV: 88.5 fL (ref 80.0–100.0)
Platelets: 289 10*3/uL (ref 150–400)
RBC: 4.18 MIL/uL (ref 3.87–5.11)
RDW: 14.6 % (ref 11.5–15.5)
WBC: 10 10*3/uL (ref 4.0–10.5)
nRBC: 0 % (ref 0.0–0.2)

## 2022-03-22 LAB — PROCALCITONIN
Procalcitonin: <0.1 ng/mL
Procalcitonin: <0.1 ng/mL

## 2022-03-22 LAB — MAGNESIUM: Magnesium: 2.5 mg/dL — ABNORMAL HIGH (ref 1.7–2.4)

## 2022-03-22 MED ORDER — ACETAMINOPHEN 500 MG PO TABS
1000.0000 mg | ORAL_TABLET | Freq: Three times a day (TID) | ORAL | Status: DC | PRN
  Administered 2022-03-23 – 2022-03-24 (×3): 1000 mg via ORAL
  Filled 2022-03-22 (×3): qty 2

## 2022-03-22 MED ORDER — BENZONATATE 100 MG PO CAPS: 100.0000 mg | ORAL_CAPSULE | Freq: Once | ORAL | Status: DC

## 2022-03-22 MED ORDER — CHLORHEXIDINE GLUCONATE CLOTH 2 % EX PADS
6.0000 | MEDICATED_PAD | Freq: Every day | CUTANEOUS | Status: DC
  Administered 2022-03-22 – 2022-03-24 (×3): 6 via TOPICAL

## 2022-03-22 MED ORDER — GUAIFENESIN-DM 100-10 MG/5ML PO SYRP
10.0000 mL | ORAL_SOLUTION | ORAL | Status: DC | PRN
  Administered 2022-03-22 – 2022-03-24 (×6): 10 mL via ORAL
  Filled 2022-03-22 (×6): qty 10

## 2022-03-22 MED ORDER — LEVALBUTEROL HCL 0.63 MG/3ML IN NEBU
0.6300 mg | INHALATION_SOLUTION | Freq: Three times a day (TID) | RESPIRATORY_TRACT | Status: DC
  Administered 2022-03-22 – 2022-03-23 (×2): 0.63 mg via RESPIRATORY_TRACT
  Filled 2022-03-22 (×4): qty 3

## 2022-03-22 MED ORDER — BENZONATATE 100 MG PO CAPS
100.0000 mg | ORAL_CAPSULE | Freq: Three times a day (TID) | ORAL | Status: DC | PRN
  Administered 2022-03-24: 100 mg via ORAL
  Filled 2022-03-22: qty 1

## 2022-03-22 NOTE — Evaluation (Signed)
Physical Therapy Evaluation Patient Details Name: Mary [name] MRN: [id number] DOB: May 30, 1926 Today's Date: 03/22/2022  History of Present Illness  Pt is a 89 y/o F admitted on 03/21/22 after presenting with c/o cough & SOB x 1 week with extreme weakness & debility. Covid, flu, RSV, and respiratory panel negative. Pt is being treated for viral upper respiratory tract infection with suspected superadded bacterial infection & extreme debility. PMH: suprapubic cath 2/2 hypotonic bladder, HTN, HLD, R mastectomy with node dissection  Clinical Impression  Pt seen for PT evaluation with pt's daughter Mary [name]) present.  Mary [name] reports pt has been living alone in a 1 level home with 4 steps with wideset B rails to enter, recently started using a RW, but was driving up until a week ago. Pt is limited by chronic R pelvic & BLE pain, to the point someone comes to assist her with bed mobility. On this date, pt requires mod assist for supine>sit, min assist for STS & max cuing for safe hand placement during transfers. Pt is able to ambulate into hallway with RW & CGA but is limited by R hip/BLE pain. Pt's family is only visiting for the holidays & cannot provide assistance at d/c. At this time, pt is unsafe to d/c home alone. Recommend STR upon d/c to maximize independence with functional mobility & reduce fall risk prior to return home.    Recommendations for follow up therapy are one component of a multi-disciplinary discharge planning process, led by the attending physician.  Recommendations may be updated based on patient status, additional functional criteria and insurance authorization.  Follow Up Recommendations Skilled nursing-short term rehab (<3 hours/day) Can patient physically be transported by private vehicle: Yes    Assistance Recommended at Discharge Frequent or constant Supervision/Assistance  Patient can return home with the following  A little help with walking and/or transfers;A little help  with bathing/dressing/bathroom;Assistance with cooking/housework;Assist for transportation;Help with stairs or ramp for entrance    Equipment Recommendations None recommended by PT  Recommendations for Other Services       Functional Status Assessment Patient has had a recent decline in their functional status and demonstrates the ability to make significant improvements in function in a reasonable and predictable amount of time.     Precautions / Restrictions Precautions Precautions: Fall Restrictions Weight Bearing Restrictions: No      Mobility  Bed Mobility Overal bed mobility: Needs Assistance Bed Mobility: Supine to Sit     Supine to sit: Mod assist, HOB elevated     General bed mobility comments: assistance to move BLE to EOB    Transfers Overall transfer level: Needs assistance Equipment used: Rolling walker (2 wheels) Transfers: Sit to/from Stand, Bed to chair/wheelchair/BSC Sit to Stand: Min assist (educational cuing re: safe hand placement during STS & to power up to standing)   Step pivot transfers: Min guard, Min assist (with RW)            Ambulation/Gait Ambulation/Gait assistance: Min assist, Min guard Gait Distance (Feet): 35 Feet Assistive device: Rolling walker (2 wheels) Gait Pattern/deviations: Decreased step length - right, Decreased step length - left, Decreased dorsiflexion - right, Decreased dorsiflexion - left, Decreased stride length, Decreased weight shift to right, Decreased stance time - right Gait velocity: decreased     General Gait Details: Decreased foot clearance BLE, decreased heel strike.  Stairs            Wheelchair Mobility    Modified Rankin (Stroke Patients Only)  Balance Overall balance assessment: Needs assistance Sitting-balance support: Feet supported, Bilateral upper extremity supported Sitting balance-Leahy Scale: Fair Sitting balance - Comments: supervision static sitting   Standing balance  support: Bilateral upper extremity supported, During functional activity, Reliant on assistive device for balance Standing balance-Leahy Scale: Fair                               Pertinent Vitals/Pain Pain Assessment Pain Assessment: Faces Faces Pain Scale: Hurts even more Pain Location: R hip/pelvis Pain Descriptors / Indicators: Discomfort, Aching Pain Intervention(s): Monitored during session, Patient requesting pain meds-RN notified, Limited activity within patient's tolerance, Repositioned    Home Living Family/patient expects to be discharged to:: Private residence Living Arrangements: Alone Available Help at Discharge: Family;Neighbor;Available PRN/intermittently Type of Home: House Home Access: Stairs to enter Entrance Stairs-Rails: Right;Left (wideset) Entrance Stairs-Number of Steps: 4   Home Layout: One level Home Equipment: Conservation officer, nature (2 wheels);Cane - single point      Prior Function               Mobility Comments: Pt driving until a week ago. Family able to get pt to start using RW 1 week ago 2/2 weakness. Denies falls in the past 6 months. Assistance to get in/out of bed 2/2 chronic pelvic/RLE pain.       Hand Dominance        Extremity/Trunk Assessment   Upper Extremity Assessment Upper Extremity Assessment: Overall WFL for tasks assessed    Lower Extremity Assessment Lower Extremity Assessment: Generalized weakness    Cervical / Trunk Assessment Cervical / Trunk Assessment: Kyphotic  Communication   Communication: No difficulties  Cognition Arousal/Alertness: Awake/alert Behavior During Therapy: WFL for tasks assessed/performed Overall Cognitive Status: Within Functional Limits for tasks assessed                                          General Comments General comments (skin integrity, edema, etc.): Pt on 3L/min via nasal cannula with SpO2 >90% throughout. BP in LUE supine in bed: 135/68 mmHg MAP 85, HR  83 bpm.    Exercises     Assessment/Plan    PT Assessment Patient needs continued PT services  PT Problem List Decreased strength;Decreased coordination;Cardiopulmonary status limiting activity;Pain;Decreased activity tolerance;Decreased balance;Decreased mobility;Decreased knowledge of use of DME;Decreased range of motion       PT Treatment Interventions DME instruction;Therapeutic exercise;Gait training;Balance training;Stair training;Neuromuscular re-education;Functional mobility training;Cognitive remediation;Patient/family education;Therapeutic activities    PT Goals (Current goals can be found in the Care Plan section)  Acute Rehab PT Goals Patient Stated Goal: go to rehab PT Goal Formulation: With family Time For Goal Achievement: 04/05/22 Potential to Achieve Goals: Good    Frequency Min 3X/week     Co-evaluation               AM-PAC PT "6 Clicks" Mobility  Outcome Measure Help needed turning from your back to your side while in a flat bed without using bedrails?: None Help needed moving from lying on your back to sitting on the side of a flat bed without using bedrails?: A Little Help needed moving to and from a bed to a chair (including a wheelchair)?: A Little Help needed standing up from a chair using your arms (e.g., wheelchair or bedside chair)?: A Little Help needed to walk in hospital  room?: A Little Help needed climbing 3-5 steps with a railing? : A Lot 6 Click Score: 18    End of Session Equipment Utilized During Treatment: Oxygen Activity Tolerance: Patient tolerated treatment well Patient left: in chair;with chair alarm set Nurse Communication: Mobility status (BP) PT Visit Diagnosis: Muscle weakness (generalized) (M62.81);Pain;Other abnormalities of gait and mobility (R26.89);Difficulty in walking, not elsewhere classified (R26.2);Unsteadiness on feet (R26.81) Pain - Right/Left: Right Pain - part of body: Hip    Time: 0354-6568 PT Time  Calculation (min) (ACUTE ONLY): 28 min   Charges:   PT Evaluation $PT Eval Moderate Complexity: [place], PT, DPT 03/22/22, 12:47 PM   Mary [name] 03/22/2022, 12:44 PM

## 2022-03-22 NOTE — NC FL2 (Signed)
[place] LEVEL OF CARE FORM     IDENTIFICATION  Patient Name: Mary [name] Birthdate: 1926/04/09 Sex: female Admission Date (Current Location): 03/21/2022  [facility] and [place] Number:  Herbalist and Address:  [facility],  [place], [place]      Provider Number: [id number]  Attending Physician Name and Address:  Barb Merino, MD  Relative Name and Phone Number:  Laurin Coder niece [phone]    Current Level of Care: Hospital Recommended Level of Care: [place] Prior Approval Number:    Date Approved/Denied:   PASRR Number: [id number]  Discharge Plan: SNF    Current Diagnoses: Patient Active Problem List   Diagnosis Date Noted   Acute UTI (urinary tract infection) 03/21/2022   URI (upper respiratory infection) 03/21/2022   Hyponatremia 03/21/2022   Hypocalcemia 03/21/2022   Asymptomatic bacteriuria 03/21/2022   Sinus arrhythmia 03/21/2022   Bladder stones 08/17/2021   Presbycusis of both ears 04/12/2021   Postnasal drip 03/01/2017   Abnormal auditory perception of left ear 12/09/2015   Bilateral impacted cerumen 11/26/2015   Ashkenazi Jewish ancestry    OA (osteoarthritis) of knee 01/24/2013   Osteoporosis 06/01/2011   Malignant neoplasm of upper-outer quadrant of right breast in female, estrogen receptor positive ([place]) 04/03/2011   Essential hypertension, benign 12/01/2010   Mixed hyperlipidemia 10/19/2010   Palpitations 10/19/2010   Atony of bladder 10/19/2010    Orientation RESPIRATION BLADDER Height & Weight     Self, Time, Situation, Place  O2 (2L) Indwelling catheter (suprapuic catheter) Weight: 54.2 kg Height:     BEHAVIORAL SYMPTOMS/MOOD NEUROLOGICAL BOWEL NUTRITION STATUS      Continent Diet  AMBULATORY STATUS COMMUNICATION OF NEEDS Skin   Limited Assist Verbally  (redness noted to sacrum)                       Personal Care Assistance Level of Assistance   Bathing, Feeding, Dressing Bathing Assistance: Limited assistance Feeding assistance: Limited assistance Dressing Assistance: Limited assistance Total Care Assistance:  (n/a)   Functional Limitations Info  Sight, Hearing, Speech Sight Info: Impaired Hearing Info: Adequate Speech Info: Adequate    SPECIAL CARE FACTORS FREQUENCY  PT (By licensed PT), OT (By licensed OT)     PT Frequency: 5x/wk OT Frequency: 5x/wk            Contractures Contractures Info: Not present    Additional Factors Info  Code Status, Allergies, Psychotropic, Insulin Sliding Scale, Isolation Precautions, Suctioning Needs Code Status Info: Full Allergies Info: Arimidex (Anastrozole), Avelox (Moxifloxacin Hcl In Nacl), Azithromycin, Bactrim, Biaxin (Clarithromycin), Nitrofurantoin, Penicillins, Sulfa Antibiotics, Sulfur Psychotropic Info: n/a see discharge summary Insulin Sliding Scale Info: see discharge summary Isolation Precautions Info: n/a Suctioning Needs: n/a   Current Medications (03/22/2022):  This is the current hospital active medication list Current Facility-Administered Medications  Medication Dose Route Frequency Provider Last Rate Last Admin   acetaminophen (TYLENOL) tablet 1,000 mg  1,000 mg Oral TID PRN Barb Merino, MD       atorvastatin (LIPITOR) tablet 40 mg  40 mg Oral Daily Reubin Milan, MD   40 mg at 03/22/22 1009   benzonatate (TESSALON) capsule 100 mg  100 mg Oral TID PRN Barb Merino, MD       budesonide (PULMICORT) nebulizer solution 0.5 mg  0.5 mg Nebulization BID Reubin Milan, MD   0.5 mg at 03/22/22 1331   cefTRIAXone (ROCEPHIN) 1 g in sodium chloride  0.9 % 100 mL IVPB  1 g Intravenous Q24H Reubin Milan, MD       Chlorhexidine Gluconate Cloth 2 % PADS 6 each  6 each Topical Daily Reubin Milan, MD   6 each at 03/22/22 1009   cholecalciferol (VITAMIN D3) 25 MCG (1000 UNIT) tablet 2,000 Units  2,000 Units Oral Daily Reubin Milan, MD   2,000  Units at 03/22/22 1009   enoxaparin (LOVENOX) injection 40 mg  40 mg Subcutaneous Q24H Reubin Milan, MD   40 mg at 03/21/22 2200   guaiFENesin-dextromethorphan (ROBITUSSIN DM) 100-10 MG/5ML syrup 10 mL  10 mL Oral Q4H PRN Barb Merino, MD       levalbuterol Penne Lash) nebulizer solution 0.63 mg  0.63 mg Nebulization TID Barb Merino, MD       ondansetron [facility]) tablet 4 mg  4 mg Oral Q6H PRN Reubin Milan, MD       Or   ondansetron [facility]) injection 4 mg  4 mg Intravenous Q6H PRN Reubin Milan, MD       verapamil (CALAN-SR) CR tablet 360 mg  360 mg Oral QHS Reubin Milan, MD   360 mg at 03/21/22 2332     Discharge Medications: Please see discharge summary for a list of discharge medications.  Relevant Imaging Results:  Relevant Lab Results:   Additional Information SS# [id number]  Angelita Ingles, RN

## 2022-03-22 NOTE — Progress Notes (Signed)
PROGRESS NOTE    Mary [name]  DGL:[id number] DOB: [birth year] DOA: 03/21/2022 PCP: Rita Ohara, MD    Brief Narrative:  89 year old with history of suprapubic catheter secondary to hypotonic bladder, hypertension, hyperlipidemia presented from home with cough, shortness of breath for about 1 week and extreme weakness and debility.  In the emergency room temperature 99.  Blood pressure stable.  94% on room air.  Urinalysis with large leukocyte esterase but taken from catheter.  COVID and influenza negative.  RSV negative.  Admitted due to significant symptoms.  Chest x-ray without any acute findings.   Assessment & Plan:   Viral upper respiratory tract infection with suspected superadded bacterial infection and extreme debility. Agree with admission given severity of symptoms. Antibiotics to treat bacterial pneumonia, presumptive Rocephin. Chest physiotherapy, incentive spirometry, deep breathing exercises, sputum induction, mucolytic's and bronchodilators. Cultures negative so far. Supplemental oxygen to keep saturations more than 90%. Mobilize with PT OT.  She is extremely debilitated.  May need short-term rehab.  Suprapubic catheter: Suspect contaminated urine.  Changed 2 weeks ago.  Exchange suprapubic catheter and sent for urine culture.  Hold off on antibiotics.  Chronic medical disease including Hyperlipidemia, on statin Essential hypertension, on verapamil    DVT prophylaxis: enoxaparin (LOVENOX) injection 40 mg Start: 03/21/22 2200   Code Status: Full code Family Communication: Daughter at the bedside Disposition Plan: Status is: Inpatient Remains inpatient appropriate because: Significant symptoms, shortness of breath     Consultants:  None  Procedures:  None  Antimicrobials:  Rocephin 1/2---   Subjective: Patient seen and examined.  She was tired with moving from ER to the floor and awake all night.  She had episodes of cough and shortness of breath that has  improved now.  Afebrile.   Objective: Vitals:   03/22/22 0200 03/22/22 0302 03/22/22 0731 03/22/22 1103  BP: (!) 150/72 133/81 105/78 (!) 130/115  Pulse: 91 94 87 100  Resp: (!) 30 16 (!) 22 20  Temp: 97.6 F (36.4 C) 98.5 F (36.9 C) 98.3 F (36.8 C) 98.6 F (37 C)  TempSrc:  Oral Oral Oral  SpO2: 96% 100% 98% 97%  Weight:  54.2 kg      Intake/Output Summary (Last 24 hours) at 03/22/2022 1122 Last data filed at 03/21/2022 2000 Gross per 24 hour  Intake 105.25 ml  Output --  Net 105.25 ml   Filed Weights   03/22/22 0302  Weight: 54.2 kg    Examination:  General exam: Appears calm and comfortable at rest.  Anxious with nagging cough. Respiratory system: Clear to auscultation.  Mostly conducted upper airway sounds. Cardiovascular system: S1 & S2 heard, RRR. No pedal edema. Gastrointestinal system: Abdomen is nondistended, soft and nontender. No organomegaly or masses felt. Normal bowel sounds heard.  Suprapubic catheter nontender and draining clear urine. Central nervous system: Alert and oriented. No focal neurological deficits.  Gross generalized weakness.    Data Reviewed: I have personally reviewed following labs and imaging studies  CBC: Recent Labs  Lab 03/21/22 1517 03/22/22 0716  WBC 11.2* 10.0  NEUTROABS 8.9*  --   HGB 12.0 11.8*  HCT 37.1 37.0  MCV 87.9 88.5  PLT 305 518   Basic Metabolic Panel: Recent Labs  Lab 03/21/22 1353 03/22/22 0716 03/22/22 0905  NA 133* 135  --   K 3.6 3.4*  --   CL 95* 98  --   CO2 26 28  --   GLUCOSE 107* 110*  --   BUN 13  13  --   CREATININE 0.51 0.53  --   CALCIUM 8.5* 7.9*  --   MG  --   --  2.5*   GFR: Estimated Creatinine Clearance: 34.7 mL/min (by C-G formula based on SCr of 0.53 mg/dL). Liver Function Tests: Recent Labs  Lab 03/22/22 0716  AST 22  ALT 19  ALKPHOS 88  BILITOT 0.9  PROT 6.2*  ALBUMIN 3.2*   No results for input(s): "LIPASE", "AMYLASE" in the last 168 hours. No results for  input(s): "AMMONIA" in the last 168 hours. Coagulation Profile: No results for input(s): "INR", "PROTIME" in the last 168 hours. Cardiac Enzymes: No results for input(s): "CKTOTAL", "CKMB", "CKMBINDEX", "TROPONINI" in the last 168 hours. BNP (last 3 results) No results for input(s): "PROBNP" in the last 8760 hours. HbA1C: No results for input(s): "HGBA1C" in the last 72 hours. CBG: No results for input(s): "GLUCAP" in the last 168 hours. Lipid Profile: No results for input(s): "CHOL", "HDL", "LDLCALC", "TRIG", "CHOLHDL", "LDLDIRECT" in the last 72 hours. Thyroid Function Tests: No results for input(s): "TSH", "T4TOTAL", "FREET4", "T3FREE", "THYROIDAB" in the last 72 hours. Anemia Panel: No results for input(s): "VITAMINB12", "FOLATE", "FERRITIN", "TIBC", "IRON", "RETICCTPCT" in the last 72 hours. Sepsis Labs: Recent Labs  Lab 03/21/22 1518 03/22/22 0716  PROCALCITON  --  <0.10  LATICACIDVEN 1.3 0.9    Recent Results (from the past 240 hour(s))  Resp panel by RT-PCR (RSV, Flu A&B, Covid) Anterior Nasal Swab     Status: None   Collection Time: 03/19/22  3:07 PM   Specimen: Anterior Nasal Swab  Result Value Ref Range Status   SARS Coronavirus 2 by RT PCR NEGATIVE NEGATIVE Final    Comment: (NOTE) SARS-CoV-2 target nucleic acids are NOT DETECTED.  The SARS-CoV-2 RNA is generally detectable in upper respiratory specimens during the acute phase of infection. The lowest concentration of SARS-CoV-2 viral copies this assay can detect is 138 copies/mL. A negative result does not preclude SARS-Cov-2 infection and should not be used as the sole basis for treatment or other patient management decisions. A negative result may occur with  improper specimen collection/handling, submission of specimen other than nasopharyngeal swab, presence of viral mutation(s) within the areas targeted by this assay, and inadequate number of viral copies(<138 copies/mL). A negative result must be  combined with clinical observations, patient history, and epidemiological information. The expected result is Negative.  Fact Sheet for Patients:  EntrepreneurPulse.com.au  Fact Sheet for Healthcare Providers:  IncredibleEmployment.be  This test is no t yet approved or cleared by the [place] FDA and  has been authorized for detection and/or diagnosis of SARS-CoV-2 by FDA under an Emergency Use Authorization (EUA). This EUA will remain  in effect (meaning this test can be used) for the duration of the COVID-19 declaration under Section 564(b)(1) of the Act, 21 U.S.C.section 360bbb-3(b)(1), unless the authorization is terminated  or revoked sooner.       Influenza A by PCR NEGATIVE NEGATIVE Final   Influenza B by PCR NEGATIVE NEGATIVE Final    Comment: (NOTE) The Xpert Xpress SARS-CoV-2/FLU/RSV plus assay is intended as an aid in the diagnosis of influenza from Nasopharyngeal swab specimens and should not be used as a sole basis for treatment. Nasal washings and aspirates are unacceptable for Xpert Xpress SARS-CoV-2/FLU/RSV testing.  Fact Sheet for Patients: EntrepreneurPulse.com.au  Fact Sheet for Healthcare Providers: IncredibleEmployment.be  This test is not yet approved or cleared by the [place] FDA and has been authorized for detection  and/or diagnosis of SARS-CoV-2 by FDA under an Emergency Use Authorization (EUA). This EUA will remain in effect (meaning this test can be used) for the duration of the COVID-19 declaration under Section 564(b)(1) of the Act, 21 U.S.C. section 360bbb-3(b)(1), unless the authorization is terminated or revoked.     Resp Syncytial Virus by PCR NEGATIVE NEGATIVE Final    Comment: (NOTE) Fact Sheet for Patients: EntrepreneurPulse.com.au  Fact Sheet for Healthcare Providers: IncredibleEmployment.be  This test is not yet  approved or cleared by the [place] FDA and has been authorized for detection and/or diagnosis of SARS-CoV-2 by FDA under an Emergency Use Authorization (EUA). This EUA will remain in effect (meaning this test can be used) for the duration of the COVID-19 declaration under Section 564(b)(1) of the Act, 21 U.S.C. section 360bbb-3(b)(1), unless the authorization is terminated or revoked.  Performed at [facility], [address], [place], [place] [address]   Resp panel by RT-PCR (RSV, Flu A&B, Covid) Anterior Nasal Swab     Status: None   Collection Time: 03/21/22  1:53 PM   Specimen: Anterior Nasal Swab  Result Value Ref Range Status   SARS Coronavirus 2 by RT PCR NEGATIVE NEGATIVE Final    Comment: (NOTE) SARS-CoV-2 target nucleic acids are NOT DETECTED.  The SARS-CoV-2 RNA is generally detectable in upper respiratory specimens during the acute phase of infection. The lowest concentration of SARS-CoV-2 viral copies this assay can detect is 138 copies/mL. A negative result does not preclude SARS-Cov-2 infection and should not be used as the sole basis for treatment or other patient management decisions. A negative result may occur with  improper specimen collection/handling, submission of specimen other than nasopharyngeal swab, presence of viral mutation(s) within the areas targeted by this assay, and inadequate number of viral copies(<138 copies/mL). A negative result must be combined with clinical observations, patient history, and epidemiological information. The expected result is Negative.  Fact Sheet for Patients:  EntrepreneurPulse.com.au  Fact Sheet for Healthcare Providers:  IncredibleEmployment.be  This test is no t yet approved or cleared by the [place] FDA and  has been authorized for detection and/or diagnosis of SARS-CoV-2 by FDA under an Emergency Use Authorization (EUA). This EUA will remain   in effect (meaning this test can be used) for the duration of the COVID-19 declaration under Section 564(b)(1) of the Act, 21 U.S.C.section 360bbb-3(b)(1), unless the authorization is terminated  or revoked sooner.       Influenza A by PCR NEGATIVE NEGATIVE Final   Influenza B by PCR NEGATIVE NEGATIVE Final    Comment: (NOTE) The Xpert Xpress SARS-CoV-2/FLU/RSV plus assay is intended as an aid in the diagnosis of influenza from Nasopharyngeal swab specimens and should not be used as a sole basis for treatment. Nasal washings and aspirates are unacceptable for Xpert Xpress SARS-CoV-2/FLU/RSV testing.  Fact Sheet for Patients: EntrepreneurPulse.com.au  Fact Sheet for Healthcare Providers: IncredibleEmployment.be  This test is not yet approved or cleared by the [place] FDA and has been authorized for detection and/or diagnosis of SARS-CoV-2 by FDA under an Emergency Use Authorization (EUA). This EUA will remain in effect (meaning this test can be used) for the duration of the COVID-19 declaration under Section 564(b)(1) of the Act, 21 U.S.C. section 360bbb-3(b)(1), unless the authorization is terminated or revoked.     Resp Syncytial Virus by PCR NEGATIVE NEGATIVE Final    Comment: (NOTE) Fact Sheet for Patients: EntrepreneurPulse.com.au  Fact Sheet for Healthcare Providers: IncredibleEmployment.be  This test  is not yet approved or cleared by the [place] and has been authorized for detection and/or diagnosis of SARS-CoV-2 by FDA under an Emergency Use Authorization (EUA). This EUA will remain in effect (meaning this test can be used) for the duration of the COVID-19 declaration under Section 564(b)(1) of the Act, 21 U.S.C. section 360bbb-3(b)(1), unless the authorization is terminated or revoked.  Performed at [facility], [place] [address] [place], [place]  [address]   Respiratory (~20 pathogens) panel by PCR     Status: None   Collection Time: 03/21/22  4:16 PM   Specimen: Nasopharyngeal Swab; Respiratory  Result Value Ref Range Status   Adenovirus NOT DETECTED NOT DETECTED Final   Coronavirus 229E NOT DETECTED NOT DETECTED Final    Comment: (NOTE) The Coronavirus on the Respiratory Panel, DOES NOT test for the novel  Coronavirus (2019 nCoV)    Coronavirus HKU1 NOT DETECTED NOT DETECTED Final   Coronavirus NL63 NOT DETECTED NOT DETECTED Final   Coronavirus OC43 NOT DETECTED NOT DETECTED Final   Metapneumovirus NOT DETECTED NOT DETECTED Final   Rhinovirus / Enterovirus NOT DETECTED NOT DETECTED Final   Influenza A NOT DETECTED NOT DETECTED Final   Influenza B NOT DETECTED NOT DETECTED Final   Parainfluenza Virus 1 NOT DETECTED NOT DETECTED Final   Parainfluenza Virus 2 NOT DETECTED NOT DETECTED Final   Parainfluenza Virus 3 NOT DETECTED NOT DETECTED Final   Parainfluenza Virus 4 NOT DETECTED NOT DETECTED Final   Respiratory Syncytial Virus NOT DETECTED NOT DETECTED Final   Bordetella pertussis NOT DETECTED NOT DETECTED Final   Bordetella Parapertussis NOT DETECTED NOT DETECTED Final   Chlamydophila pneumoniae NOT DETECTED NOT DETECTED Final   Mycoplasma pneumoniae NOT DETECTED NOT DETECTED Final    Comment: Performed at [facility], [place]. [address], [place], [place] [address]         Radiology Studies: DG Hip Malvin Johns or Wo Pelvis 2-3 Views Right  Result Date: 03/21/2022 CLINICAL DATA:  Right hip pain. EXAM: DG HIP (WITH OR WITHOUT PELVIS) 2-3V RIGHT COMPARISON:  KUB 07/08/2016 FINDINGS: Severe right than left superomedial femoroacetabular joint space narrowing is worsened from 07/08/2016, with right-greater-than-left bone-on-bone contact. New superomedial right femoral head and acetabular mild cortical flattening/remodeling. Large left femoral head-neck junction circumferential degenerative osteophytes are again seen. Moderate to  severe pubic symphysis joint space narrowing, subchondral sclerosis, and peripheral osteophytosis. Mild bilateral sacroiliac subchondral sclerosis. Severe right L4-5 disc space narrowing, endplate sclerosis, endplate osteophytosis. No acute fracture is seen.  No dislocation. IMPRESSION: 1. 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 2. 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 Electronically Signed   By: Yvonne Kendall M.D.   On: 03/21/2022 18:15   DG Chest 2 View  Result Date: 03/21/2022 CLINICAL DATA:  Shortness of breath EXAM: CHEST - 2 VIEW COMPARISON:  03/19/2022 FINDINGS: Cardiac and mediastinal contours are within normal limits. No focal pulmonary opacity. Redemonstrated and lucency in the bilateral lungs, consistent with emphysema. No pleural effusion or pneumothorax. No acute osseous abnormality. Redemonstrated extensive anterior costochondral calcifications. IMPRESSION: No acute cardiopulmonary process. Electronically Signed   By: Merilyn Baba M.D.   On: 03/21/2022 15:40        Scheduled Meds:  atorvastatin  40 mg Oral Daily   budesonide (PULMICORT) nebulizer solution  0.5 mg Nebulization BID   Chlorhexidine Gluconate Cloth  6 each Topical Daily   cholecalciferol  2,000 Units Oral Daily   enoxaparin (LOVENOX) injection  40  mg Subcutaneous Q24H   levalbuterol  0.63 mg Nebulization QID   verapamil  360 mg Oral QHS   Continuous Infusions:  cefTRIAXone (ROCEPHIN)  IV       LOS: 1 day    Time spent: 35 minutes    Barb Merino, MD [facility] Pager [phone]

## 2022-03-22 NOTE — ED Notes (Addendum)
Rn called to room. Pt states "I can't Breathe" RN noted HR 128, spo2 92% , resp 28. Pt placed on 2L . Spo2 increased to 96%, resp 24, hr 116.  Provider notirfied

## 2022-03-23 DIAGNOSIS — I10 Essential (primary) hypertension: Secondary | ICD-10-CM | POA: Diagnosis not present

## 2022-03-23 DIAGNOSIS — E871 Hypo-osmolality and hyponatremia: Secondary | ICD-10-CM | POA: Diagnosis not present

## 2022-03-23 DIAGNOSIS — R5381 Other malaise: Secondary | ICD-10-CM | POA: Diagnosis not present

## 2022-03-23 LAB — PROCALCITONIN: Procalcitonin: <0.1 ng/mL

## 2022-03-23 MED ORDER — LEVALBUTEROL HCL 0.63 MG/3ML IN NEBU
0.6300 mg | INHALATION_SOLUTION | Freq: Two times a day (BID) | RESPIRATORY_TRACT | Status: DC
  Administered 2022-03-24: 0.63 mg via RESPIRATORY_TRACT
  Filled 2022-03-23: qty 3

## 2022-03-23 NOTE — Progress Notes (Signed)
PROGRESS NOTE    Mary [name]  YDX:[id number] DOB: [birth year] DOA: 03/21/2022 PCP: Rita Ohara, MD    Brief Narrative:  89 year old with history of suprapubic catheter secondary to hypotonic bladder, hypertension, hyperlipidemia presented from home with cough, shortness of breath for about 1 week and extreme weakness and debility.  In the emergency room temperature 99.  Blood pressure stable.  94% on room air.  Urinalysis with large leukocyte esterase but taken from catheter.  COVID and influenza negative.  RSV negative.  Admitted due to significant symptoms.  Chest x-ray without any acute findings.   Assessment & Plan:   Viral upper respiratory tract infection with suspected superadded bacterial infection and extreme debility. Antibiotics to treat bacterial pneumonia, presumptive Rocephin. Chest physiotherapy, incentive spirometry, deep breathing exercises, sputum induction, mucolytic's and bronchodilators. Cultures negative so far. Supplemental oxygen to keep saturations more than 90%. Mobilize with PT OT.  She is extremely debilitated.  Referred to short-term rehab.  Suprapubic catheter: Suspect contaminated urine.  Exchanged.  Urine is normal.   Chronic medical disease including Hyperlipidemia, on statin Essential hypertension, on verapamil    DVT prophylaxis: enoxaparin (LOVENOX) injection 40 mg Start: 03/21/22 2200   Code Status: Full code Family Communication: Daughter at the bedside Disposition Plan: Status is: Inpatient Remains inpatient appropriate because: Significant symptoms, shortness of breath, debilitated.  Needs rehab.     Consultants:  None  Procedures:  None  Antimicrobials:  Rocephin 1/2---   Subjective: Patient seen and examined.  Symptoms slightly better today.  Afebrile.  Still has occasional dry cough.     Objective: Vitals:   03/22/22 2042 03/23/22 0359 03/23/22 0820 03/23/22 0822  BP:  122/73    Pulse:  82    Resp:  18    Temp:  98 F  (36.7 C)    TempSrc:  Oral    SpO2: 99% 98% 98% 98%  Weight:      Height:        Intake/Output Summary (Last 24 hours) at 03/23/2022 1145 Last data filed at 03/23/2022 0400 Gross per 24 hour  Intake 116 ml  Output 200 ml  Net -84 ml    Filed Weights   03/22/22 0302  Weight: 54.2 kg    Examination:  General exam: Appears calm and comfortable at rest. Mostly on room air.  Respiratory system: good bilateral air entry. Mostly conducted upper airway sounds. Cardiovascular system: S1 & S2 heard, RRR. No pedal edema. Gastrointestinal system: Abdomen is nondistended, soft and nontender. No organomegaly or masses felt. Normal bowel sounds heard.  Suprapubic catheter nontender and draining clear urine. Central nervous system: Alert and oriented. No focal neurological deficits.  Gross generalized weakness.    Data Reviewed: I have personally reviewed following labs and imaging studies  CBC: Recent Labs  Lab 03/21/22 1517 03/22/22 0716  WBC 11.2* 10.0  NEUTROABS 8.9*  --   HGB 12.0 11.8*  HCT 37.1 37.0  MCV 87.9 88.5  PLT 305 720    Basic Metabolic Panel: Recent Labs  Lab 03/21/22 1353 03/22/22 0716 03/22/22 0905  NA 133* 135  --   K 3.6 3.4*  --   CL 95* 98  --   CO2 26 28  --   GLUCOSE 107* 110*  --   BUN 13 13  --   CREATININE 0.51 0.53  --   CALCIUM 8.5* 7.9*  --   MG  --   --  2.5*    GFR: Estimated Creatinine Clearance: 34.7 mL/min (  by C-G formula based on SCr of 0.53 mg/dL). Liver Function Tests: Recent Labs  Lab 03/22/22 0716  AST 22  ALT 19  ALKPHOS 88  BILITOT 0.9  PROT 6.2*  ALBUMIN 3.2*    No results for input(s): "LIPASE", "AMYLASE" in the last 168 hours. No results for input(s): "AMMONIA" in the last 168 hours. Coagulation Profile: No results for input(s): "INR", "PROTIME" in the last 168 hours. Cardiac Enzymes: No results for input(s): "CKTOTAL", "CKMB", "CKMBINDEX", "TROPONINI" in the last 168 hours. BNP (last 3 results) No results  for input(s): "PROBNP" in the last 8760 hours. HbA1C: No results for input(s): "HGBA1C" in the last 72 hours. CBG: No results for input(s): "GLUCAP" in the last 168 hours. Lipid Profile: No results for input(s): "CHOL", "HDL", "LDLCALC", "TRIG", "CHOLHDL", "LDLDIRECT" in the last 72 hours. Thyroid Function Tests: No results for input(s): "TSH", "T4TOTAL", "FREET4", "T3FREE", "THYROIDAB" in the last 72 hours. Anemia Panel: No results for input(s): "VITAMINB12", "FOLATE", "FERRITIN", "TIBC", "IRON", "RETICCTPCT" in the last 72 hours. Sepsis Labs: Recent Labs  Lab 03/21/22 1518 03/22/22 0716 03/22/22 0905 03/23/22 0557  PROCALCITON  --  <0.10 <0.10 <0.10  LATICACIDVEN 1.3 0.9  --   --      Recent Results (from the past 240 hour(s))  Resp panel by RT-PCR (RSV, Flu A&B, Covid) Anterior Nasal Swab     Status: None   Collection Time: 03/19/22  3:07 PM   Specimen: Anterior Nasal Swab  Result Value Ref Range Status   SARS Coronavirus 2 by RT PCR NEGATIVE NEGATIVE Final    Comment: (NOTE) SARS-CoV-2 target nucleic acids are NOT DETECTED.  The SARS-CoV-2 RNA is generally detectable in upper respiratory specimens during the acute phase of infection. The lowest concentration of SARS-CoV-2 viral copies this assay can detect is 138 copies/mL. A negative result does not preclude SARS-Cov-2 infection and should not be used as the sole basis for treatment or other patient management decisions. A negative result may occur with  improper specimen collection/handling, submission of specimen other than nasopharyngeal swab, presence of viral mutation(s) within the areas targeted by this assay, and inadequate number of viral copies(<138 copies/mL). A negative result must be combined with clinical observations, patient history, and epidemiological information. The expected result is Negative.  Fact Sheet for Patients:  EntrepreneurPulse.com.au  Fact Sheet for Healthcare  Providers:  IncredibleEmployment.be  This test is no t yet approved or cleared by the [place] FDA and  has been authorized for detection and/or diagnosis of SARS-CoV-2 by FDA under an Emergency Use Authorization (EUA). This EUA will remain  in effect (meaning this test can be used) for the duration of the COVID-19 declaration under Section 564(b)(1) of the Act, 21 U.S.C.section 360bbb-3(b)(1), unless the authorization is terminated  or revoked sooner.       Influenza A by PCR NEGATIVE NEGATIVE Final   Influenza B by PCR NEGATIVE NEGATIVE Final    Comment: (NOTE) The Xpert Xpress SARS-CoV-2/FLU/RSV plus assay is intended as an aid in the diagnosis of influenza from Nasopharyngeal swab specimens and should not be used as a sole basis for treatment. Nasal washings and aspirates are unacceptable for Xpert Xpress SARS-CoV-2/FLU/RSV testing.  Fact Sheet for Patients: EntrepreneurPulse.com.au  Fact Sheet for Healthcare Providers: IncredibleEmployment.be  This test is not yet approved or cleared by the [place] FDA and has been authorized for detection and/or diagnosis of SARS-CoV-2 by FDA under an Emergency Use Authorization (EUA). This EUA will remain in effect (meaning this test  can be used) for the duration of the COVID-19 declaration under Section 564(b)(1) of the Act, 21 U.S.C. section 360bbb-3(b)(1), unless the authorization is terminated or revoked.     Resp Syncytial Virus by PCR NEGATIVE NEGATIVE Final    Comment: (NOTE) Fact Sheet for Patients: EntrepreneurPulse.com.au  Fact Sheet for Healthcare Providers: IncredibleEmployment.be  This test is not yet approved or cleared by the [place] FDA and has been authorized for detection and/or diagnosis of SARS-CoV-2 by FDA under an Emergency Use Authorization (EUA). This EUA will remain in effect (meaning this test can be  used) for the duration of the COVID-19 declaration under Section 564(b)(1) of the Act, 21 U.S.C. section 360bbb-3(b)(1), unless the authorization is terminated or revoked.  Performed at [facility], [address] [place], [place] [address]   Resp panel by RT-PCR (RSV, Flu A&B, Covid) Anterior Nasal Swab     Status: None   Collection Time: 03/21/22  1:53 PM   Specimen: Anterior Nasal Swab  Result Value Ref Range Status   SARS Coronavirus 2 by RT PCR NEGATIVE NEGATIVE Final    Comment: (NOTE) SARS-CoV-2 target nucleic acids are NOT DETECTED.  The SARS-CoV-2 RNA is generally detectable in upper respiratory specimens during the acute phase of infection. The lowest concentration of SARS-CoV-2 viral copies this assay can detect is 138 copies/mL. A negative result does not preclude SARS-Cov-2 infection and should not be used as the sole basis for treatment or other patient management decisions. A negative result may occur with  improper specimen collection/handling, submission of specimen other than nasopharyngeal swab, presence of viral mutation(s) within the areas targeted by this assay, and inadequate number of viral copies(<138 copies/mL). A negative result must be combined with clinical observations, patient history, and epidemiological information. The expected result is Negative.  Fact Sheet for Patients:  EntrepreneurPulse.com.au  Fact Sheet for Healthcare Providers:  IncredibleEmployment.be  This test is no t yet approved or cleared by the [place] FDA and  has been authorized for detection and/or diagnosis of SARS-CoV-2 by FDA under an Emergency Use Authorization (EUA). This EUA will remain  in effect (meaning this test can be used) for the duration of the COVID-19 declaration under Section 564(b)(1) of the Act, 21 U.S.C.section 360bbb-3(b)(1), unless the authorization is terminated  or revoked sooner.        Influenza A by PCR NEGATIVE NEGATIVE Final   Influenza B by PCR NEGATIVE NEGATIVE Final    Comment: (NOTE) The Xpert Xpress SARS-CoV-2/FLU/RSV plus assay is intended as an aid in the diagnosis of influenza from Nasopharyngeal swab specimens and should not be used as a sole basis for treatment. Nasal washings and aspirates are unacceptable for Xpert Xpress SARS-CoV-2/FLU/RSV testing.  Fact Sheet for Patients: EntrepreneurPulse.com.au  Fact Sheet for Healthcare Providers: IncredibleEmployment.be  This test is not yet approved or cleared by the [place] FDA and has been authorized for detection and/or diagnosis of SARS-CoV-2 by FDA under an Emergency Use Authorization (EUA). This EUA will remain in effect (meaning this test can be used) for the duration of the COVID-19 declaration under Section 564(b)(1) of the Act, 21 U.S.C. section 360bbb-3(b)(1), unless the authorization is terminated or revoked.     Resp Syncytial Virus by PCR NEGATIVE NEGATIVE Final    Comment: (NOTE) Fact Sheet for Patients: EntrepreneurPulse.com.au  Fact Sheet for Healthcare Providers: IncredibleEmployment.be  This test is not yet approved or cleared by the [place] FDA and has been authorized for detection and/or diagnosis of SARS-CoV-2  by FDA under an Emergency Use Authorization (EUA). This EUA will remain in effect (meaning this test can be used) for the duration of the COVID-19 declaration under Section 564(b)(1) of the Act, 21 U.S.C. section 360bbb-3(b)(1), unless the authorization is terminated or revoked.  Performed at [facility], [place] [address], [place], [place] [address]   Respiratory (~20 pathogens) panel by PCR     Status: None   Collection Time: 03/21/22  4:16 PM   Specimen: Nasopharyngeal Swab; Respiratory  Result Value Ref Range Status   Adenovirus NOT DETECTED NOT DETECTED Final    Coronavirus 229E NOT DETECTED NOT DETECTED Final    Comment: (NOTE) The Coronavirus on the Respiratory Panel, DOES NOT test for the novel  Coronavirus (2019 nCoV)    Coronavirus HKU1 NOT DETECTED NOT DETECTED Final   Coronavirus NL63 NOT DETECTED NOT DETECTED Final   Coronavirus OC43 NOT DETECTED NOT DETECTED Final   Metapneumovirus NOT DETECTED NOT DETECTED Final   Rhinovirus / Enterovirus NOT DETECTED NOT DETECTED Final   Influenza A NOT DETECTED NOT DETECTED Final   Influenza B NOT DETECTED NOT DETECTED Final   Parainfluenza Virus 1 NOT DETECTED NOT DETECTED Final   Parainfluenza Virus 2 NOT DETECTED NOT DETECTED Final   Parainfluenza Virus 3 NOT DETECTED NOT DETECTED Final   Parainfluenza Virus 4 NOT DETECTED NOT DETECTED Final   Respiratory Syncytial Virus NOT DETECTED NOT DETECTED Final   Bordetella pertussis NOT DETECTED NOT DETECTED Final   Bordetella Parapertussis NOT DETECTED NOT DETECTED Final   Chlamydophila pneumoniae NOT DETECTED NOT DETECTED Final   Mycoplasma pneumoniae NOT DETECTED NOT DETECTED Final    Comment: Performed at [facility], [place]. [address] [place], [place] [address]  Urine Culture     Status: Abnormal (Preliminary result)   Collection Time: 03/21/22  7:25 PM   Specimen: Urine, Clean Catch  Result Value Ref Range Status   Specimen Description   Final    URINE, CLEAN CATCH Performed at [facility], [place] [address] [place], [place] [address]    Special Requests   Final    NONE Performed at [facility], [place] [address], [place], [place] [address]    Culture (A)  Final    30,000 COLONIES/mL ESCHERICHIA COLI SUSCEPTIBILITIES TO FOLLOW Performed at [facility], [place] [address], [place],  [address]    Report Status PENDING  Incomplete         Radiology Studies: DG Hip Unilat W or Wo Pelvis 2-3 Views Right  Result Date: 03/21/2022 CLINICAL DATA:  Right hip pain. EXAM: DG HIP (WITH OR  WITHOUT PELVIS) 2-3V RIGHT COMPARISON:  KUB 07/08/2016 FINDINGS: Severe right than left superomedial femoroacetabular joint space narrowing is worsened from 07/08/2016, with right-greater-than-left bone-on-bone contact. New superomedial right femoral head and acetabular mild cortical flattening/remodeling. Large left femoral head-neck junction circumferential degenerative osteophytes are again seen. Moderate to severe pubic symphysis joint space narrowing, subchondral sclerosis, and peripheral osteophytosis. Mild bilateral sacroiliac subchondral sclerosis. Severe right L4-5 disc space narrowing, endplate sclerosis, endplate osteophytosis. No acute fracture is seen.  No dislocation. IMPRESSION: 1. 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 2. 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 Electronically Signed   By: Yvonne Kendall M.D.   On: 03/21/2022 18:15   DG Chest 2 View  Result Date: 03/21/2022 CLINICAL DATA:  Shortness of breath EXAM: CHEST - 2 VIEW COMPARISON:  03/19/2022 FINDINGS: Cardiac and mediastinal contours are within normal limits.  No focal pulmonary opacity. Redemonstrated and lucency in the bilateral lungs, consistent with emphysema. No pleural effusion or pneumothorax. No acute osseous abnormality. Redemonstrated extensive anterior costochondral calcifications. IMPRESSION: No acute cardiopulmonary process. Electronically Signed   By: Merilyn Baba M.D.   On: 03/21/2022 15:40        Scheduled Meds:  atorvastatin  40 mg Oral Daily   budesonide (PULMICORT) nebulizer solution  0.5 mg Nebulization BID   Chlorhexidine Gluconate Cloth  6 each Topical Daily   cholecalciferol  2,000 Units Oral Daily   enoxaparin (LOVENOX) injection  40 mg Subcutaneous Q24H   levalbuterol  0.63 mg Nebulization TID   verapamil  360 mg Oral QHS   Continuous Infusions:  cefTRIAXone (ROCEPHIN)  IV 1 g (03/22/22 1629)     LOS: 2 days    Time spent: 35  minutes    Barb Merino, MD [facility] Pager [phone]

## 2022-03-23 NOTE — Evaluation (Signed)
Occupational Therapy Evaluation Patient Details Name: Mary [name] MRN: [id number] DOB: 1927-03-12 Today's Date: 03/23/2022   History of Present Illness Pt is a 89 y/o F admitted on 03/21/22 after presenting with c/o cough & SOB x 1 week with extreme weakness & debility. Covid, flu, RSV, and respiratory panel negative. Pt is being treated for viral upper respiratory tract infection. PMH: suprapubic cath 2/2 hypotonic bladder, HTN, HLD, R mastectomy with node dissection   Clinical Impression   The patient is currently presenting well below her baseline level of functioning for self-care management. She is limited by deconditioning, generalized weakness, significant R hip/groin pain due to chronic arthritis with associated decreased standing tolerance with impaired functional mobility, and compromised activity tolerance. She attempted to ambulate to the bathroom for toileting, however was unable to successfully do so, given increased proximal RLE pain. Without further OT services, she is at risk for further weakness and deconditioning, as well as restricted ADL participation.      Recommendations for follow up therapy are one component of a multi-disciplinary discharge planning process, led by the attending physician.  Recommendations may be updated based on patient status, additional functional criteria and insurance authorization.   Follow Up Recommendations  Skilled nursing-short term rehab (<3 hours/day)     Assistance Recommended at Discharge Frequent or constant Supervision/Assistance  Patient can return home with the following A lot of help with bathing/dressing/bathroom;Assist for transportation;Assistance with cooking/housework;A little help with walking and/or transfers    Functional Status Assessment  Patient has had a recent decline in their functional status and demonstrates the ability to make significant improvements in function in a reasonable and predictable amount of time.   Equipment Recommendations  None recommended by OT       Precautions / Restrictions Precautions Precautions: Fall Restrictions Weight Bearing Restrictions: No      Mobility Bed Mobility Overal bed mobility: Needs Assistance Bed Mobility: Supine to Sit     Supine to sit: Mod assist, HOB elevated     General bed mobility comments: assistance to move BLE to EOB due to pain; required use of bed rail, as well as increased effort for task    Transfers Overall transfer level: Needs assistance Equipment used: Rolling walker (2 wheels) Transfers: Sit to/from Stand, Bed to chair/wheelchair/BSC Sit to Stand: Min assist, From elevated surface     Step pivot transfers: Min guard, Min assist            Balance Overall balance assessment: Needs assistance     Sitting balance - Comments:  (static sitting-good. dynamic sitting-fair+)     Standing balance-Leahy Scale: Fair             ADL either performed or assessed with clinical judgement   ADL Overall ADL's : Needs assistance/impaired Eating/Feeding: Independent;Sitting Eating/Feeding Details (indicate cue type and reason): based on clinical judgement Grooming: Set up;Sitting           Upper Body Dressing : Minimal assistance;Sitting   Lower Body Dressing: Maximal assistance Lower Body Dressing Details (indicate cue type and reason): She required significant assist for sock management seated EOB                    Pertinent Vitals/Pain Pain Assessment Pain Assessment: Faces Pain Score: 5  Pain Location: R hip/pelvis Pain Intervention(s): Limited activity within patient's tolerance, Patient requesting pain meds-RN notified     Hand Dominance     Extremity/Trunk Assessment Upper Extremity Assessment Upper Extremity Assessment:  (BUE  AROM WFL. Gross strength 4-/5)   Lower Extremity Assessment Lower Extremity Assessment: Generalized weakness       Communication Communication Communication: No  difficulties   Cognition Arousal/Alertness: Awake/alert Behavior During Therapy: WFL for tasks assessed/performed Overall Cognitive Status: Within Functional Limits for tasks assessed              General Comments: Oriented to person, place, and year. Disoriented to month. Able to follow commands                Home Living Family/patient expects to be discharged to:: Private residence Living Arrangements: Alone Available Help at Discharge: Family Type of Home: House Home Access: Stairs to enter [employer] of Steps: 4 Entrance Stairs-Rails: [place]: Two level;Able to live on main level with bedroom/bathroom; She resides on he main level of the home)     Bathroom Shower/Tub: Walk-in shower         Home Equipment: Conservation officer, nature (2 wheels);Cane - single point          Prior Functioning/Environment      Mobility Comments:  (Normally used a cane, however over the past week or so she started using a RW.) ADLs Comments: Prior to a couple wks ago, she was independent with driving and ADLs.        OT Problem List: Decreased strength;Decreased activity tolerance;Impaired balance (sitting and/or standing);Decreased knowledge of use of DME or AE;Pain      OT Treatment/Interventions: Self-care/ADL training;Therapeutic exercise;Therapeutic activities;Energy conservation;DME and/or AE instruction;Patient/family education;Balance training    OT Goals(Current goals can be found in the care plan section) Acute Rehab OT Goals Patient Stated Goal: to get better OT Goal Formulation: With patient/family Time For Goal Achievement: 04/06/22 Potential to Achieve Goals: Good ADL Goals Pt Will Perform Grooming: with supervision;standing Pt Will Perform Upper Body Dressing: with set-up;sitting Pt Will Perform Lower Body Dressing: with supervision;sit to/from stand Pt Will Transfer to Toilet: with supervision;ambulating Pt Will Perform Toileting - Clothing  Manipulation and hygiene: with supervision;sit to/from stand  OT Frequency: Min 2X/week       AM-PAC OT "6 Clicks" Daily Activity     Outcome Measure Help from another person eating meals?: None Help from another person taking care of personal grooming?: None Help from another person toileting, which includes using toliet, bedpan, or urinal?: A Lot Help from another person bathing (including washing, rinsing, drying)?: A Lot Help from another person to put on and taking off regular upper body clothing?: A Little Help from another person to put on and taking off regular lower body clothing?: A Lot 6 Click Score: 17   End of Session Equipment Utilized During Treatment: Rolling walker (2 wheels) Nurse Communication: Mobility status  Activity Tolerance: Patient limited by pain Patient left: in chair;with call bell/phone within reach;with family/visitor present  OT Visit Diagnosis: Unsteadiness on feet (R26.81);Pain                Time: 1640-1706 OT Time Calculation (min): 26 min Charges:  OT General Charges $OT Visit: 1 Visit OT Evaluation $OT Eval Moderate Complexity: 1 Mod OT Treatments $Therapeutic Activity: 8-22 mins    Leota Sauers, OTR/L 03/23/2022, 5:30 PM

## 2022-03-23 NOTE — TOC Initial Note (Signed)
Transition of Care [facility]) - Initial/Assessment Note    Patient Details  Name: Mary [name] MRN: [id number] Date of Birth: May 29, 1926  Transition of Care [facility]) CM/SW Contact:    Angelita Ingles, RN Phone Number:[phone]  03/23/2022, 12:36 PM  Clinical Narrative:                 [facility] consulted for patient with new SNF recommendation. Patient is from home where she normally functions independently. Patient has PCP Rita Ohara, MD . Patient reports that she does follow up on a regular basis. Patient has access to meds and meds are affordable. No Home health needs no DME.   CM at bedside to introduced self and explain referral for short term rehab. Patient/ daughter are agreeable to rehab. CM has provided medicare.gov list for facilities. Info has been faxed out. Awaiting bed offers.  Expected Discharge Plan: Skilled Nursing Facility Barriers to Discharge: SNF Pending bed offer   Patient Goals and CMS Choice Patient states their goals for this hospitalization and ongoing recovery are:: Wants to get better CMS Medicare.gov Compare Post Acute Care list provided to:: Patient Represenative (must comment) Choice offered to / list presented to : Patient, Adult Children      Expected Discharge Plan and Services In-house Referral: NA Discharge Planning Services: CM Consult Post Acute Care Choice: [place] Living arrangements for the past 2 months: Single Family Home                 DME Arranged: N/A DME Agency: NA       HH Arranged: NA          Prior Living Arrangements/Services Living arrangements for the past 2 months: Single Family Home Lives with:: Self Patient language and need for interpreter reviewed:: Yes Do you feel safe going back to the place where you live?: Yes      Need for Family Participation in Patient Care: Yes (Comment) Care giver support system in place?: Yes (comment) Current home services:  (n/a) Criminal Activity/Legal Involvement Pertinent  to Current Situation/Hospitalization: No - Comment as needed  Activities of Daily Living Home Assistive Devices/Equipment: Walker (specify type) ADL Screening (condition at time of admission) Patient's cognitive ability adequate to safely complete daily activities?: Yes Is the patient deaf or have difficulty hearing?: No Does the patient have difficulty seeing, even when wearing glasses/contacts?: No Does the patient have difficulty concentrating, remembering, or making decisions?: No Patient able to express need for assistance with ADLs?: Yes Does the patient have difficulty dressing or bathing?: Yes Independently performs ADLs?: Yes (appropriate for developmental age) Does the patient have difficulty walking or climbing stairs?: Yes Weakness of Legs: Both Weakness of Arms/Hands: None  Permission Sought/Granted Permission sought to share information with : Case Manager, Family Supports Permission granted to share information with : Yes, Verbal Permission Granted  Share Information with NAME: Laurin Coder daughter [phone]     Permission granted to share info w Relationship: daughter     Emotional Assessment Appearance:: Appears stated age Attitude/Demeanor/Rapport: Gracious Affect (typically observed): Accepting, Pleasant Orientation: : Oriented to Self, Oriented to Place, Oriented to  Time, Oriented to Situation   Psych Involvement: No (comment)  Admission diagnosis:  Right hip pain [M25.551] Acute cystitis with hematuria [N30.01] Viral URI with cough [J06.9] Acute UTI (urinary tract infection) [N39.0] Patient Active Problem List   Diagnosis Date Noted   Acute UTI (urinary tract infection) 03/21/2022   URI (upper respiratory infection) 03/21/2022   Hyponatremia 03/21/2022  Hypocalcemia 03/21/2022   Asymptomatic bacteriuria 03/21/2022   Sinus arrhythmia 03/21/2022   Bladder stones 08/17/2021   Presbycusis of both ears 04/12/2021   Postnasal drip 03/01/2017    Abnormal auditory perception of left ear 12/09/2015   Bilateral impacted cerumen 11/26/2015   Ashkenazi Jewish ancestry    OA (osteoarthritis) of knee 01/24/2013   Osteoporosis 06/01/2011   Malignant neoplasm of upper-outer quadrant of right breast in female, estrogen receptor positive ([place]) 04/03/2011   Essential hypertension, benign 12/01/2010   Mixed hyperlipidemia 10/19/2010   Palpitations 10/19/2010   Atony of bladder 10/19/2010   PCP:  Rita Ohara, MD Pharmacy:   [place] [place] [address] [place] [address] Phone: [phone] Fax: [phone]     Social Determinants of Health (SDOH) Social History: [place]: No Food Insecurity (03/22/2022)  Housing: Low Risk  (03/22/2022)  Transportation Needs: No Transportation Needs (03/22/2022)  Utilities: Not At Risk (03/22/2022)  Depression (PHQ2-9): Low Risk  (07/18/2021)  Tobacco Use: Low Risk  (03/22/2022)   SDOH Interventions:     Readmission Risk Interventions     No data to display

## 2022-03-24 DIAGNOSIS — R8271 Bacteriuria: Secondary | ICD-10-CM | POA: Diagnosis not present

## 2022-03-24 DIAGNOSIS — R2689 Other abnormalities of gait and mobility: Secondary | ICD-10-CM | POA: Diagnosis not present

## 2022-03-24 DIAGNOSIS — R059 Cough, unspecified: Secondary | ICD-10-CM | POA: Diagnosis not present

## 2022-03-24 DIAGNOSIS — J069 Acute upper respiratory infection, unspecified: Secondary | ICD-10-CM | POA: Diagnosis not present

## 2022-03-24 DIAGNOSIS — R600 Localized edema: Secondary | ICD-10-CM | POA: Diagnosis not present

## 2022-03-24 DIAGNOSIS — E782 Mixed hyperlipidemia: Secondary | ICD-10-CM | POA: Diagnosis not present

## 2022-03-24 DIAGNOSIS — R52 Pain, unspecified: Secondary | ICD-10-CM | POA: Diagnosis not present

## 2022-03-24 DIAGNOSIS — R5381 Other malaise: Secondary | ICD-10-CM | POA: Diagnosis not present

## 2022-03-24 DIAGNOSIS — M1611 Unilateral primary osteoarthritis, right hip: Secondary | ICD-10-CM | POA: Diagnosis not present

## 2022-03-24 DIAGNOSIS — M810 Age-related osteoporosis without current pathological fracture: Secondary | ICD-10-CM | POA: Diagnosis not present

## 2022-03-24 DIAGNOSIS — Z7689 Persons encountering health services in other specified circumstances: Secondary | ICD-10-CM | POA: Diagnosis not present

## 2022-03-24 DIAGNOSIS — E871 Hypo-osmolality and hyponatremia: Secondary | ICD-10-CM | POA: Diagnosis not present

## 2022-03-24 DIAGNOSIS — H9113 Presbycusis, bilateral: Secondary | ICD-10-CM | POA: Diagnosis not present

## 2022-03-24 DIAGNOSIS — M6281 Muscle weakness (generalized): Secondary | ICD-10-CM | POA: Diagnosis not present

## 2022-03-24 DIAGNOSIS — I498 Other specified cardiac arrhythmias: Secondary | ICD-10-CM | POA: Diagnosis not present

## 2022-03-24 DIAGNOSIS — R278 Other lack of coordination: Secondary | ICD-10-CM | POA: Diagnosis not present

## 2022-03-24 DIAGNOSIS — K625 Hemorrhage of anus and rectum: Secondary | ICD-10-CM | POA: Diagnosis not present

## 2022-03-24 DIAGNOSIS — I10 Essential (primary) hypertension: Secondary | ICD-10-CM | POA: Diagnosis not present

## 2022-03-24 DIAGNOSIS — M25551 Pain in right hip: Secondary | ICD-10-CM | POA: Diagnosis not present

## 2022-03-24 DIAGNOSIS — Z7401 Bed confinement status: Secondary | ICD-10-CM | POA: Diagnosis not present

## 2022-03-24 DIAGNOSIS — E785 Hyperlipidemia, unspecified: Secondary | ICD-10-CM | POA: Diagnosis not present

## 2022-03-24 DIAGNOSIS — K5900 Constipation, unspecified: Secondary | ICD-10-CM | POA: Diagnosis not present

## 2022-03-24 DIAGNOSIS — N312 Flaccid neuropathic bladder, not elsewhere classified: Secondary | ICD-10-CM | POA: Diagnosis not present

## 2022-03-24 DIAGNOSIS — E569 Vitamin deficiency, unspecified: Secondary | ICD-10-CM | POA: Diagnosis not present

## 2022-03-24 DIAGNOSIS — R531 Weakness: Secondary | ICD-10-CM | POA: Diagnosis not present

## 2022-03-24 DIAGNOSIS — H04129 Dry eye syndrome of unspecified lacrimal gland: Secondary | ICD-10-CM | POA: Diagnosis not present

## 2022-03-24 LAB — URINE CULTURE: Culture: 30000 — AB

## 2022-03-24 MED ORDER — SODIUM CHLORIDE 0.9 % IV SOLN
1.0000 g | Freq: Once | INTRAVENOUS | Status: AC
  Administered 2022-03-24: 1 g via INTRAVENOUS
  Filled 2022-03-24: qty 10

## 2022-03-24 MED ORDER — ALBUTEROL SULFATE HFA 108 (90 BASE) MCG/ACT IN AERS: 2.0000 | INHALATION_SPRAY | Freq: Four times a day (QID) | RESPIRATORY_TRACT | 2 refills | Status: DC | PRN

## 2022-03-24 MED ORDER — GUAIFENESIN-DM 100-10 MG/5ML PO SYRP: 10.0000 mL | ORAL_SOLUTION | ORAL | 0 refills | Status: DC | PRN

## 2022-03-24 MED ORDER — BENZONATATE 100 MG PO CAPS: 100.0000 mg | ORAL_CAPSULE | Freq: Three times a day (TID) | ORAL | 0 refills | Status: DC | PRN

## 2022-03-24 NOTE — Progress Notes (Signed)
Report called to [facility], nurse at [facility]. Daughter and patient aware, all belongings packed and sent with daughter.

## 2022-03-24 NOTE — Discharge Summary (Signed)
Physician Discharge Summary  Mary [name] WPY:[id number] DOB: March 30, 1926 DOA: 03/21/2022  PCP: Rita Ohara, MD  Admit date: 03/21/2022 Discharge date: 03/24/2022  Admitted From: Home Disposition: Skilled nursing facility  Recommendations for Outpatient Follow-up:  Follow up with PCP in 1-2 weeks after discharge.   Home Health: N/A Equipment/Devices: N/A  Discharge Condition: Stable CODE STATUS: Full code Diet recommendation: Regular diet, nutritional supplements  Discharge summary: 89 year old with history of suprapubic catheter secondary to hypotonic bladder, hypertension, hyperlipidemia presented from home with cough, shortness of breath for about 1 week and extreme weakness and debility.  In the emergency room temperature 99.  Blood pressure stable.  94% on room air.  Urinalysis with large leukocyte esterase but taken from old catheter.  COVID and influenza negative.  RSV negative.  Admitted due to significant symptoms.  Chest x-ray without any acute findings.  # Viral upper respiratory tract infection with suspected superadded bacterial infection and extreme debility. Received 3 days of antibiotics with Rocephin.  No evidence of bacterial infection.  Discontinue further antibiotics.  Chest physiotherapy, incentive spirometry, deep breathing exercises, sputum induction, mucolytic's and bronchodilators to continue at rehab. Cultures negative so far. Supplemental oxygen to keep saturations more than 90%.  Mostly on room air. Mobilize with PT OT.  She is extremely debilitated.  Referred to short-term rehab.   Suprapubic catheter: Suspect contaminated urine.  Exchanged.  Urine is normal.    Chronic medical disease including Hyperlipidemia, on statin Essential hypertension, on verapamil  Medically stable to transfer to skilled level of care today.   Discharge Diagnoses:  Principal Problem:   URI (upper respiratory infection) Active Problems:   Mixed hyperlipidemia   Essential  hypertension, benign   Hyponatremia   Hypocalcemia   Asymptomatic bacteriuria   Sinus arrhythmia    Discharge Instructions  Discharge Instructions     Diet - low sodium heart healthy   Complete by: As directed    Increase activity slowly   Complete by: As directed       Allergies as of 03/24/2022       Reactions   Arimidex [anastrozole] Rash   Avelox [moxifloxacin Hcl In Nacl] Nausea Only, Rash   Azithromycin Nausea Only, Rash   Bactrim Nausea Only, Rash   Biaxin [clarithromycin] Nausea Only, Rash   Nitrofurantoin Nausea Only, Rash, Other (See Comments)   Penicillins Nausea Only, Rash   Has patient had a PCN reaction causing immediate rash, facial/tongue/throat swelling, SOB or lightheadedness with hypotension: no Has patient had a PCN reaction causing severe rash involving mucus membranes or skin necrosis: no Has patient had a PCN reaction that required hospitalization no Has patient had a PCN reaction occurring within the last 10 years no If all of the above answers are "NO", then may proceed with Cephalosporin use.   Sulfa Antibiotics Nausea And Vomiting, Rash   Sulfur Nausea And Vomiting, Rash        Medication List     STOP taking these medications    bisacodyl 5 MG EC tablet Commonly known as: DULCOLAX   Valium 2 MG tablet Generic drug: diazepam       TAKE these medications    acetaminophen 500 MG tablet Commonly known as: TYLENOL Take 1,000 mg by mouth 3 (three) times daily.   albuterol 108 (90 Base) MCG/ACT inhaler Commonly known as: VENTOLIN HFA Inhale 2 puffs into the lungs every 6 (six) hours as needed for wheezing or shortness of breath.   atorvastatin 40 MG tablet Commonly known as:  LIPITOR Take 1 tablet (40 mg total) by mouth daily.   benzonatate 100 MG capsule Commonly known as: TESSALON Take 1 capsule (100 mg total) by mouth 3 (three) times daily as needed for cough.   CALCIUM 600+D PO Take 1 tablet by mouth daily.   denosumab  60 MG/ML Soln injection Commonly known as: PROLIA Inject 60 mg into the skin every 6 (six) months. Administer in upper arm, thigh, or abdomen   fluticasone 0.005 % ointment Commonly known as: CUTIVATE Apply 1 Application topically 2 (two) times daily.   guaiFENesin-dextromethorphan 100-10 MG/5ML syrup Commonly known as: ROBITUSSIN DM Take 10 mLs by mouth every 4 (four) hours as needed for cough.   hydrocortisone 2.5 % rectal cream Commonly known as: Anusol-HC Place 1 application. rectally 2 (two) times daily. What changed:  when to take this reasons to take this   ibuprofen 200 MG tablet Commonly known as: ADVIL Take 200 mg by mouth every 6 (six) hours as needed for moderate pain.   loratadine 10 MG tablet Commonly known as: CLARITIN Take 10 mg by mouth daily as needed for allergies.   polyethylene glycol 17 g packet Commonly known as: MIRALAX / GLYCOLAX Take 17 g by mouth daily as needed for moderate constipation.   PreserVision AREDS 2 Caps Take 1 capsule by mouth 2 (two) times daily.   REFRESH OP Place 1 drop into both eyes 2 (two) times daily.   verapamil 360 MG 24 hr capsule Commonly known as: VERELAN PM Take 1 capsule (360 mg total) by mouth at bedtime.   Vitamin D3 50 MCG (2000 UT) Tabs Take 2,000 Units by mouth daily.        Allergies  Allergen Reactions   Arimidex [Anastrozole] Rash   Avelox [Moxifloxacin Hcl In Nacl] Nausea Only and Rash   Azithromycin Nausea Only and Rash   Bactrim Nausea Only and Rash   Biaxin [Clarithromycin] Nausea Only and Rash   Nitrofurantoin Nausea Only, Rash and Other (See Comments)   Penicillins Nausea Only and Rash    Has patient had a PCN reaction causing immediate rash, facial/tongue/throat swelling, SOB or lightheadedness with hypotension: no Has patient had a PCN reaction causing severe rash involving mucus membranes or skin necrosis: no Has patient had a PCN reaction that required hospitalization no Has patient had  a PCN reaction occurring within the last 10 years no If all of the above answers are "NO", then may proceed with Cephalosporin use.    Sulfa Antibiotics Nausea And Vomiting and Rash   Sulfur Nausea And Vomiting and Rash    Consultations: None   Procedures/Studies: DG Hip Unilat W or Wo Pelvis 2-3 Views Right  Result Date: 03/21/2022 CLINICAL DATA:  Right hip pain. EXAM: DG HIP (WITH OR WITHOUT PELVIS) 2-3V RIGHT COMPARISON:  KUB 07/08/2016 FINDINGS: Severe right than left superomedial femoroacetabular joint space narrowing is worsened from 07/08/2016, with right-greater-than-left bone-on-bone contact. New superomedial right femoral head and acetabular mild cortical flattening/remodeling. Large left femoral head-neck junction circumferential degenerative osteophytes are again seen. Moderate to severe pubic symphysis joint space narrowing, subchondral sclerosis, and peripheral osteophytosis. Mild bilateral sacroiliac subchondral sclerosis. Severe right L4-5 disc space narrowing, endplate sclerosis, endplate osteophytosis. No acute fracture is seen.  No dislocation. IMPRESSION: 1. 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 2. 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 Electronically Signed   By: Yvonne Kendall M.D.   On: 03/21/2022 18:15   DG Chest 2 View  Result Date: 03/21/2022 CLINICAL DATA:  Shortness of breath EXAM: CHEST - 2 VIEW COMPARISON:  03/19/2022 FINDINGS: Cardiac and mediastinal contours are within normal limits. No focal pulmonary opacity. Redemonstrated and lucency in the bilateral lungs, consistent with emphysema. No pleural effusion or pneumothorax. No acute osseous abnormality. Redemonstrated extensive anterior costochondral calcifications. IMPRESSION: No acute cardiopulmonary process. Electronically Signed   By: Merilyn Baba M.D.   On: 03/21/2022 15:40   DG Chest 2 View  Result Date: 03/19/2022 CLINICAL DATA:  Cough. EXAM:  CHEST - 2 VIEW COMPARISON:  Chest radiographs 03/23/2015 in 01/16/2013 FINDINGS: Cardiac silhouette and mediastinal contours are unchanged and within normal limits. There are again extensive anterior costochondral calcifications. Findings diaphragms and increased lucencies in the bilateral lungs consistent with the emphysematous changes. No acute lung airspace opacity. Left nipple shadow overlies left costophrenic angle. No pleural effusion pneumothorax. Moderate to severe multilevel disc space narrowing, endplate sclerosis, and anterior endplate osteophytes on lateral view. IMPRESSION: 1. No acute cardiopulmonary process. 2. Chronic emphysematous changes. 3. Moderate to severe multilevel degenerative disc disease. Electronically Signed   By: Yvonne Kendall M.D.   On: 03/19/2022 16:13   (Echo, Carotid, EGD, Colonoscopy, ERCP)    Subjective: Seen and examined.  Some dry cough.  No other events.  Afebrile.  Mostly on room air.   Discharge Exam: Vitals:   03/24/22 0737 03/24/22 1000  BP:    Pulse:    Resp: 19 20  Temp:    SpO2: 97% 95%   Vitals:   03/24/22 0515 03/24/22 0518 03/24/22 0737 03/24/22 1000  BP: 131/74     Pulse: 73     Resp: '18  19 20  '$ Temp: 98.3 F (36.8 C)     TempSrc: Oral     SpO2: 99% 99% 97% 95%  Weight:      Height:        General: Pt is alert, awake, not in acute distress Cardiovascular: RRR, S1/S2 +, no rubs, no gallops Respiratory: CTA bilaterally, no wheezing, no rhonchi, occasional conducted upper airway sounds. Abdominal: Soft, NT, ND, bowel sounds + Extremities: no edema, no cyanosis    The results of significant diagnostics from this hospitalization (including imaging, microbiology, ancillary and laboratory) are listed below for reference.     Microbiology: Recent Results (from the past 240 hour(s))  Resp panel by RT-PCR (RSV, Flu A&B, Covid) Anterior Nasal Swab     Status: None   Collection Time: 03/19/22  3:07 PM   Specimen: Anterior Nasal Swab   Result Value Ref Range Status   SARS Coronavirus 2 by RT PCR NEGATIVE NEGATIVE Final    Comment: (NOTE) SARS-CoV-2 target nucleic acids are NOT DETECTED.  The SARS-CoV-2 RNA is generally detectable in upper respiratory specimens during the acute phase of infection. The lowest concentration of SARS-CoV-2 viral copies this assay can detect is 138 copies/mL. A negative result does not preclude SARS-Cov-2 infection and should not be used as the sole basis for treatment or other patient management decisions. A negative result may occur with  improper specimen collection/handling, submission of specimen other than nasopharyngeal swab, presence of viral mutation(s) within the areas targeted by this assay, and inadequate number of viral copies(<138 copies/mL). A negative result must be combined with clinical observations, patient history, and epidemiological information. The expected result is Negative.  Fact Sheet for Patients:  EntrepreneurPulse.com.au  Fact Sheet for Healthcare Providers:  IncredibleEmployment.be  This test is no t yet approved or cleared by the [place] and  has been authorized for  detection and/or diagnosis of SARS-CoV-2 by FDA under an Emergency Use Authorization (EUA). This EUA will remain  in effect (meaning this test can be used) for the duration of the COVID-19 declaration under Section 564(b)(1) of the Act, 21 U.S.C.section 360bbb-3(b)(1), unless the authorization is terminated  or revoked sooner.       Influenza A by PCR NEGATIVE NEGATIVE Final   Influenza B by PCR NEGATIVE NEGATIVE Final    Comment: (NOTE) The Xpert Xpress SARS-CoV-2/FLU/RSV plus assay is intended as an aid in the diagnosis of influenza from Nasopharyngeal swab specimens and should not be used as a sole basis for treatment. Nasal washings and aspirates are unacceptable for Xpert Xpress SARS-CoV-2/FLU/RSV testing.  Fact Sheet for  Patients: EntrepreneurPulse.com.au  Fact Sheet for Healthcare Providers: IncredibleEmployment.be  This test is not yet approved or cleared by the [place] FDA and has been authorized for detection and/or diagnosis of SARS-CoV-2 by FDA under an Emergency Use Authorization (EUA). This EUA will remain in effect (meaning this test can be used) for the duration of the COVID-19 declaration under Section 564(b)(1) of the Act, 21 U.S.C. section 360bbb-3(b)(1), unless the authorization is terminated or revoked.     Resp Syncytial Virus by PCR NEGATIVE NEGATIVE Final    Comment: (NOTE) Fact Sheet for Patients: EntrepreneurPulse.com.au  Fact Sheet for Healthcare Providers: IncredibleEmployment.be  This test is not yet approved or cleared by the [place] FDA and has been authorized for detection and/or diagnosis of SARS-CoV-2 by FDA under an Emergency Use Authorization (EUA). This EUA will remain in effect (meaning this test can be used) for the duration of the COVID-19 declaration under Section 564(b)(1) of the Act, 21 U.S.C. section 360bbb-3(b)(1), unless the authorization is terminated or revoked.  Performed at [facility], [address] [place],  [address]   Resp panel by RT-PCR (RSV, Flu A&B, Covid) Anterior Nasal Swab     Status: None   Collection Time: 03/21/22  1:53 PM   Specimen: Anterior Nasal Swab  Result Value Ref Range Status   SARS Coronavirus 2 by RT PCR NEGATIVE NEGATIVE Final    Comment: (NOTE) SARS-CoV-2 target nucleic acids are NOT DETECTED.  The SARS-CoV-2 RNA is generally detectable in upper respiratory specimens during the acute phase of infection. The lowest concentration of SARS-CoV-2 viral copies this assay can detect is 138 copies/mL. A negative result does not preclude SARS-Cov-2 infection and should not be used as the sole basis for treatment  or other patient management decisions. A negative result may occur with  improper specimen collection/handling, submission of specimen other than nasopharyngeal swab, presence of viral mutation(s) within the areas targeted by this assay, and inadequate number of viral copies(<138 copies/mL). A negative result must be combined with clinical observations, patient history, and epidemiological information. The expected result is Negative.  Fact Sheet for Patients:  EntrepreneurPulse.com.au  Fact Sheet for Healthcare Providers:  IncredibleEmployment.be  This test is no t yet approved or cleared by the [place] FDA and  has been authorized for detection and/or diagnosis of SARS-CoV-2 by FDA under an Emergency Use Authorization (EUA). This EUA will remain  in effect (meaning this test can be used) for the duration of the COVID-19 declaration under Section 564(b)(1) of the Act, 21 U.S.C.section 360bbb-3(b)(1), unless the authorization is terminated  or revoked sooner.       Influenza A by PCR NEGATIVE NEGATIVE Final   Influenza B by PCR NEGATIVE NEGATIVE Final    Comment: (NOTE) The Xpert  Xpress SARS-CoV-2/FLU/RSV plus assay is intended as an aid in the diagnosis of influenza from Nasopharyngeal swab specimens and should not be used as a sole basis for treatment. Nasal washings and aspirates are unacceptable for Xpert Xpress SARS-CoV-2/FLU/RSV testing.  Fact Sheet for Patients: EntrepreneurPulse.com.au  Fact Sheet for Healthcare Providers: IncredibleEmployment.be  This test is not yet approved or cleared by the [place] FDA and has been authorized for detection and/or diagnosis of SARS-CoV-2 by FDA under an Emergency Use Authorization (EUA). This EUA will remain in effect (meaning this test can be used) for the duration of the COVID-19 declaration under Section 564(b)(1) of the Act, 21 U.S.C. section  360bbb-3(b)(1), unless the authorization is terminated or revoked.     Resp Syncytial Virus by PCR NEGATIVE NEGATIVE Final    Comment: (NOTE) Fact Sheet for Patients: EntrepreneurPulse.com.au  Fact Sheet for Healthcare Providers: IncredibleEmployment.be  This test is not yet approved or cleared by the [place] FDA and has been authorized for detection and/or diagnosis of SARS-CoV-2 by FDA under an Emergency Use Authorization (EUA). This EUA will remain in effect (meaning this test can be used) for the duration of the COVID-19 declaration under Section 564(b)(1) of the Act, 21 U.S.C. section 360bbb-3(b)(1), unless the authorization is terminated or revoked.  Performed at [facility], [place] [address], [place], [place] [address]   Respiratory (~20 pathogens) panel by PCR     Status: None   Collection Time: 03/21/22  4:16 PM   Specimen: Nasopharyngeal Swab; Respiratory  Result Value Ref Range Status   Adenovirus NOT DETECTED NOT DETECTED Final   Coronavirus 229E NOT DETECTED NOT DETECTED Final    Comment: (NOTE) The Coronavirus on the Respiratory Panel, DOES NOT test for the novel  Coronavirus (2019 nCoV)    Coronavirus HKU1 NOT DETECTED NOT DETECTED Final   Coronavirus NL63 NOT DETECTED NOT DETECTED Final   Coronavirus OC43 NOT DETECTED NOT DETECTED Final   Metapneumovirus NOT DETECTED NOT DETECTED Final   Rhinovirus / Enterovirus NOT DETECTED NOT DETECTED Final   Influenza A NOT DETECTED NOT DETECTED Final   Influenza B NOT DETECTED NOT DETECTED Final   Parainfluenza Virus 1 NOT DETECTED NOT DETECTED Final   Parainfluenza Virus 2 NOT DETECTED NOT DETECTED Final   Parainfluenza Virus 3 NOT DETECTED NOT DETECTED Final   Parainfluenza Virus 4 NOT DETECTED NOT DETECTED Final   Respiratory Syncytial Virus NOT DETECTED NOT DETECTED Final   Bordetella pertussis NOT DETECTED NOT DETECTED Final   Bordetella Parapertussis  NOT DETECTED NOT DETECTED Final   Chlamydophila pneumoniae NOT DETECTED NOT DETECTED Final   Mycoplasma pneumoniae NOT DETECTED NOT DETECTED Final    Comment: Performed at [facility], [place]. [address] [place], [place] [address]  Urine Culture     Status: Abnormal   Collection Time: 03/21/22  7:25 PM   Specimen: Urine, Clean Catch  Result Value Ref Range Status   Specimen Description   Final    URINE, CLEAN CATCH Performed at [facility], [place] [address], [place], [place] [address]    Special Requests   Final    NONE Performed at [facility], [place] [address] [place], [place] [address]    Culture 30,000 COLONIES/mL ESCHERICHIA COLI (A)  Final   Report Status 03/24/2022 FINAL  Final   Organism ID, Bacteria ESCHERICHIA COLI (A)  Final      Susceptibility   Escherichia coli - MIC*    AMPICILLIN 8 SENSITIVE  Sensitive     CEFAZOLIN <=4 SENSITIVE Sensitive     CEFEPIME <=0.12 SENSITIVE Sensitive     CEFTRIAXONE <=0.25 SENSITIVE Sensitive     CIPROFLOXACIN <=0.25 SENSITIVE Sensitive     GENTAMICIN <=1 SENSITIVE Sensitive     IMIPENEM <=0.25 SENSITIVE Sensitive     NITROFURANTOIN <=16 SENSITIVE Sensitive     TRIMETH/SULFA <=20 SENSITIVE Sensitive     AMPICILLIN/SULBACTAM 4 SENSITIVE Sensitive     PIP/TAZO <=4 SENSITIVE Sensitive     * 30,000 COLONIES/mL ESCHERICHIA COLI     Labs: BNP (last 3 results) No results for input(s): "BNP" in the last 8760 hours. Basic Metabolic Panel: Recent Labs  Lab 03/21/22 1353 03/22/22 0716 03/22/22 0905  NA 133* 135  --   K 3.6 3.4*  --   CL 95* 98  --   CO2 26 28  --   GLUCOSE 107* 110*  --   BUN 13 13  --   CREATININE 0.51 0.53  --   CALCIUM 8.5* 7.9*  --   MG  --   --  2.5*   Liver Function Tests: Recent Labs  Lab 03/22/22 0716  AST 22  ALT 19  ALKPHOS 88  BILITOT 0.9  PROT 6.2*  ALBUMIN 3.2*   No results for input(s): "LIPASE", "AMYLASE" in the last 168 hours. No results for  input(s): "AMMONIA" in the last 168 hours. CBC: Recent Labs  Lab 03/21/22 1517 03/22/22 0716  WBC 11.2* 10.0  NEUTROABS 8.9*  --   HGB 12.0 11.8*  HCT 37.1 37.0  MCV 87.9 88.5  PLT 305 289   Cardiac Enzymes: No results for input(s): "CKTOTAL", "CKMB", "CKMBINDEX", "TROPONINI" in the last 168 hours. BNP: Invalid input(s): "POCBNP" CBG: No results for input(s): "GLUCAP" in the last 168 hours. D-Dimer No results for input(s): "DDIMER" in the last 72 hours. Hgb A1c No results for input(s): "HGBA1C" in the last 72 hours. Lipid Profile No results for input(s): "CHOL", "HDL", "LDLCALC", "TRIG", "CHOLHDL", "LDLDIRECT" in the last 72 hours. Thyroid function studies No results for input(s): "TSH", "T4TOTAL", "T3FREE", "THYROIDAB" in the last 72 hours.  Invalid input(s): "FREET3" Anemia work up No results for input(s): "VITAMINB12", "FOLATE", "FERRITIN", "TIBC", "IRON", "RETICCTPCT" in the last 72 hours. Urinalysis    Component Value Date/Time   COLORURINE YELLOW 03/22/2022 1137   APPEARANCEUR CLEAR 03/22/2022 1137   LABSPEC 1.015 03/22/2022 1137   PHURINE 5.0 03/22/2022 1137   GLUCOSEU NEGATIVE 03/22/2022 1137   HGBUR MODERATE (A) 03/22/2022 1137   BILIRUBINUR NEGATIVE 03/22/2022 1137   BILIRUBINUR negative 12/25/2020 1629   BILIRUBINUR neg 04/10/2016 1442   KETONESUR 5 (A) 03/22/2022 1137   PROTEINUR 100 (A) 03/22/2022 1137   UROBILINOGEN 0.2 12/25/2020 1629   UROBILINOGEN 0.2 02/22/2013 0447   NITRITE NEGATIVE 03/22/2022 1137   LEUKOCYTESUR MODERATE (A) 03/22/2022 1137   Sepsis Labs Recent Labs  Lab 03/21/22 1517 03/22/22 0716  WBC 11.2* 10.0   Microbiology Recent Results (from the past 240 hour(s))  Resp panel by RT-PCR (RSV, Flu A&B, Covid) Anterior Nasal Swab     Status: None   Collection Time: 03/19/22  3:07 PM   Specimen: Anterior Nasal Swab  Result Value Ref Range Status   SARS Coronavirus 2 by RT PCR NEGATIVE NEGATIVE Final    Comment: (NOTE) SARS-CoV-2  target nucleic acids are NOT DETECTED.  The SARS-CoV-2 RNA is generally detectable in upper respiratory specimens during the acute phase of infection. The lowest concentration of SARS-CoV-2 viral copies this assay  can detect is 138 copies/mL. A negative result does not preclude SARS-Cov-2 infection and should not be used as the sole basis for treatment or other patient management decisions. A negative result may occur with  improper specimen collection/handling, submission of specimen other than nasopharyngeal swab, presence of viral mutation(s) within the areas targeted by this assay, and inadequate number of viral copies(<138 copies/mL). A negative result must be combined with clinical observations, patient history, and epidemiological information. The expected result is Negative.  Fact Sheet for Patients:  EntrepreneurPulse.com.au  Fact Sheet for Healthcare Providers:  IncredibleEmployment.be  This test is no t yet approved or cleared by the [place] FDA and  has been authorized for detection and/or diagnosis of SARS-CoV-2 by FDA under an Emergency Use Authorization (EUA). This EUA will remain  in effect (meaning this test can be used) for the duration of the COVID-19 declaration under Section 564(b)(1) of the Act, 21 U.S.C.section 360bbb-3(b)(1), unless the authorization is terminated  or revoked sooner.       Influenza A by PCR NEGATIVE NEGATIVE Final   Influenza B by PCR NEGATIVE NEGATIVE Final    Comment: (NOTE) The Xpert Xpress SARS-CoV-2/FLU/RSV plus assay is intended as an aid in the diagnosis of influenza from Nasopharyngeal swab specimens and should not be used as a sole basis for treatment. Nasal washings and aspirates are unacceptable for Xpert Xpress SARS-CoV-2/FLU/RSV testing.  Fact Sheet for Patients: EntrepreneurPulse.com.au  Fact Sheet for Healthcare  Providers: IncredibleEmployment.be  This test is not yet approved or cleared by the [place] FDA and has been authorized for detection and/or diagnosis of SARS-CoV-2 by FDA under an Emergency Use Authorization (EUA). This EUA will remain in effect (meaning this test can be used) for the duration of the COVID-19 declaration under Section 564(b)(1) of the Act, 21 U.S.C. section 360bbb-3(b)(1), unless the authorization is terminated or revoked.     Resp Syncytial Virus by PCR NEGATIVE NEGATIVE Final    Comment: (NOTE) Fact Sheet for Patients: EntrepreneurPulse.com.au  Fact Sheet for Healthcare Providers: IncredibleEmployment.be  This test is not yet approved or cleared by the [place] FDA and has been authorized for detection and/or diagnosis of SARS-CoV-2 by FDA under an Emergency Use Authorization (EUA). This EUA will remain in effect (meaning this test can be used) for the duration of the COVID-19 declaration under Section 564(b)(1) of the Act, 21 U.S.C. section 360bbb-3(b)(1), unless the authorization is terminated or revoked.  Performed at [facility], [address] [place], [place] [address]   Resp panel by RT-PCR (RSV, Flu A&B, Covid) Anterior Nasal Swab     Status: None   Collection Time: 03/21/22  1:53 PM   Specimen: Anterior Nasal Swab  Result Value Ref Range Status   SARS Coronavirus 2 by RT PCR NEGATIVE NEGATIVE Final    Comment: (NOTE) SARS-CoV-2 target nucleic acids are NOT DETECTED.  The SARS-CoV-2 RNA is generally detectable in upper respiratory specimens during the acute phase of infection. The lowest concentration of SARS-CoV-2 viral copies this assay can detect is 138 copies/mL. A negative result does not preclude SARS-Cov-2 infection and should not be used as the sole basis for treatment or other patient management decisions. A negative result may occur with  improper  specimen collection/handling, submission of specimen other than nasopharyngeal swab, presence of viral mutation(s) within the areas targeted by this assay, and inadequate number of viral copies(<138 copies/mL). A negative result must be combined with clinical observations, patient history, and epidemiological information. The expected  result is Negative.  Fact Sheet for Patients:  EntrepreneurPulse.com.au  Fact Sheet for Healthcare Providers:  IncredibleEmployment.be  This test is no t yet approved or cleared by the [place] FDA and  has been authorized for detection and/or diagnosis of SARS-CoV-2 by FDA under an Emergency Use Authorization (EUA). This EUA will remain  in effect (meaning this test can be used) for the duration of the COVID-19 declaration under Section 564(b)(1) of the Act, 21 U.S.C.section 360bbb-3(b)(1), unless the authorization is terminated  or revoked sooner.       Influenza A by PCR NEGATIVE NEGATIVE Final   Influenza B by PCR NEGATIVE NEGATIVE Final    Comment: (NOTE) The Xpert Xpress SARS-CoV-2/FLU/RSV plus assay is intended as an aid in the diagnosis of influenza from Nasopharyngeal swab specimens and should not be used as a sole basis for treatment. Nasal washings and aspirates are unacceptable for Xpert Xpress SARS-CoV-2/FLU/RSV testing.  Fact Sheet for Patients: EntrepreneurPulse.com.au  Fact Sheet for Healthcare Providers: IncredibleEmployment.be  This test is not yet approved or cleared by the [place] FDA and has been authorized for detection and/or diagnosis of SARS-CoV-2 by FDA under an Emergency Use Authorization (EUA). This EUA will remain in effect (meaning this test can be used) for the duration of the COVID-19 declaration under Section 564(b)(1) of the Act, 21 U.S.C. section 360bbb-3(b)(1), unless the authorization is terminated or revoked.     Resp  Syncytial Virus by PCR NEGATIVE NEGATIVE Final    Comment: (NOTE) Fact Sheet for Patients: EntrepreneurPulse.com.au  Fact Sheet for Healthcare Providers: IncredibleEmployment.be  This test is not yet approved or cleared by the [place] FDA and has been authorized for detection and/or diagnosis of SARS-CoV-2 by FDA under an Emergency Use Authorization (EUA). This EUA will remain in effect (meaning this test can be used) for the duration of the COVID-19 declaration under Section 564(b)(1) of the Act, 21 U.S.C. section 360bbb-3(b)(1), unless the authorization is terminated or revoked.  Performed at [facility], [place] [address] [place], [place] [address]   Respiratory (~20 pathogens) panel by PCR     Status: None   Collection Time: 03/21/22  4:16 PM   Specimen: Nasopharyngeal Swab; Respiratory  Result Value Ref Range Status   Adenovirus NOT DETECTED NOT DETECTED Final   Coronavirus 229E NOT DETECTED NOT DETECTED Final    Comment: (NOTE) The Coronavirus on the Respiratory Panel, DOES NOT test for the novel  Coronavirus (2019 nCoV)    Coronavirus HKU1 NOT DETECTED NOT DETECTED Final   Coronavirus NL63 NOT DETECTED NOT DETECTED Final   Coronavirus OC43 NOT DETECTED NOT DETECTED Final   Metapneumovirus NOT DETECTED NOT DETECTED Final   Rhinovirus / Enterovirus NOT DETECTED NOT DETECTED Final   Influenza A NOT DETECTED NOT DETECTED Final   Influenza B NOT DETECTED NOT DETECTED Final   Parainfluenza Virus 1 NOT DETECTED NOT DETECTED Final   Parainfluenza Virus 2 NOT DETECTED NOT DETECTED Final   Parainfluenza Virus 3 NOT DETECTED NOT DETECTED Final   Parainfluenza Virus 4 NOT DETECTED NOT DETECTED Final   Respiratory Syncytial Virus NOT DETECTED NOT DETECTED Final   Bordetella pertussis NOT DETECTED NOT DETECTED Final   Bordetella Parapertussis NOT DETECTED NOT DETECTED Final   Chlamydophila pneumoniae NOT DETECTED NOT  DETECTED Final   Mycoplasma pneumoniae NOT DETECTED NOT DETECTED Final    Comment: Performed at [facility], [place]. [address] [place],  [address]  Urine Culture     Status:  Abnormal   Collection Time: 03/21/22  7:25 PM   Specimen: Urine, Clean Catch  Result Value Ref Range Status   Specimen Description   Final    URINE, CLEAN CATCH Performed at [facility], [place] [address], [place], [place] [address]    Special Requests   Final    NONE Performed at [facility], [place] [address], [place], [place] [address]    Culture 30,000 COLONIES/mL ESCHERICHIA COLI (A)  Final   Report Status 03/24/2022 FINAL  Final   Organism ID, Bacteria ESCHERICHIA COLI (A)  Final      Susceptibility   Escherichia coli - MIC*    AMPICILLIN 8 SENSITIVE Sensitive     CEFAZOLIN <=4 SENSITIVE Sensitive     CEFEPIME <=0.12 SENSITIVE Sensitive     CEFTRIAXONE <=0.25 SENSITIVE Sensitive     CIPROFLOXACIN <=0.25 SENSITIVE Sensitive     GENTAMICIN <=1 SENSITIVE Sensitive     IMIPENEM <=0.25 SENSITIVE Sensitive     NITROFURANTOIN <=16 SENSITIVE Sensitive     TRIMETH/SULFA <=20 SENSITIVE Sensitive     AMPICILLIN/SULBACTAM 4 SENSITIVE Sensitive     PIP/TAZO <=4 SENSITIVE Sensitive     * 30,000 COLONIES/mL ESCHERICHIA COLI     Time coordinating discharge:  35 minutes  SIGNED:   Barb Merino, MD  [facility] 03/24/2022, 12:04 PM

## 2022-03-24 NOTE — Care Management Important Message (Signed)
Important Message  Patient Details IM Letter given. Name: Mary [name] MRN: [id number] Date of Birth: 11-Jul-1926   Medicare Important Message Given:  Yes     Kerin Salen 03/24/2022, 9:17 AM

## 2022-03-24 NOTE — Progress Notes (Signed)
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  In the emergency room temperature 99.  Blood pressure stable.  94% on room air.  Urinalysis with large leukocyte esterase but taken from catheter.  COVID and influenza negative.  RSV negative.  Admitted due to significant symptoms.  Chest x-ray without any acute findings.   Assessment & Plan:   Viral upper respiratory tract infection with suspected superadded bacterial infection and extreme debility. Continue antibiotics for 5 days.  Will change to oral on discharge. Chest physiotherapy, incentive spirometry, deep breathing exercises, sputum induction, mucolytic's and bronchodilators. Cultures negative so far. Supplemental oxygen to keep saturations more than 90%. Mobilize with PT OT.  She is extremely debilitated.  Referred to short-term rehab.  Suprapubic catheter: Suspect contaminated urine.  Exchanged.  Urine is normal.   Chronic medical disease including Hyperlipidemia, on statin Essential hypertension, on verapamil    DVT prophylaxis: enoxaparin (LOVENOX) injection 40 mg Start: 03/21/22 2200   Code Status: Full code Family Communication: none today. Disposition Plan: Status is: Inpatient Remains inpatient appropriate because: Medically stable.  Can go to SNF when bed available.     Consultants:  None  Procedures:  None  Antimicrobials:  Rocephin 1/2---   Subjective: Patient seen and examined.  Rough night with coughing otherwise no other new complaints.   Objective: Vitals:   03/24/22 0515 03/24/22 0518 03/24/22 0737 03/24/22 1000  BP: 131/74     Pulse: 73     Resp: '18  19 20  '$ Temp: 98.3 F (36.8 C)     TempSrc: Oral      SpO2: 99% 99% 97% 95%  Weight:      Height:        Intake/Output Summary (Last 24 hours) at 03/24/2022 1056 Last data filed at 03/24/2022 0818 Gross per 24 hour  Intake 360 ml  Output 845 ml  Net -485 ml   Filed Weights   03/22/22 0302  Weight: 54.2 kg    Examination:  General: Looks comfortable.  Mostly on room air.  Occasional nagging cough. Cardiovascular: S1-S2 normal.  Regular rate rhythm. Respiratory: Bilateral clear.  Occasional conducted upper airway sounds. Gastrointestinal: Soft.  Nontender.  Bowel sounds present. Ext: No swelling or edema.  No cyanosis. Neuro: Alert awake and oriented.  No focal neurological deficits.    Data Reviewed: I have personally reviewed following labs and imaging studies  CBC: Recent Labs  Lab 03/21/22 1517 03/22/22 0716  WBC 11.2* 10.0  NEUTROABS 8.9*  --   HGB 12.0 11.8*  HCT 37.1 37.0  MCV 87.9 88.5  PLT 305 378   Basic Metabolic Panel: Recent Labs  Lab 03/21/22 1353 03/22/22 0716 03/22/22 0905  NA 133* 135  --   K 3.6 3.4*  --   CL 95* 98  --   CO2 26 28  --   GLUCOSE 107* 110*  --   BUN 13 13  --   CREATININE 0.51 0.53  --   CALCIUM 8.5* 7.9*  --   MG  --   --  2.5*   GFR: Estimated Creatinine Clearance: 34.7 mL/min (by C-G formula based on SCr of 0.53 mg/dL). Liver Function Tests: Recent Labs  Lab 03/22/22 0716  AST 22  ALT  19  ALKPHOS 88  BILITOT 0.9  PROT 6.2*  ALBUMIN 3.2*   No results for input(s): "LIPASE", "AMYLASE" in the last 168 hours. No results for input(s): "AMMONIA" in the last 168 hours. Coagulation Profile: No results for input(s): "INR", "PROTIME" in the last 168 hours. Cardiac Enzymes: No results for input(s): "CKTOTAL", "CKMB", "CKMBINDEX", "TROPONINI" in the last 168 hours. BNP (last 3 results) No results for input(s): "PROBNP" in the last 8760 hours. HbA1C: No results for input(s): "HGBA1C" in the last 72 hours. CBG: No results for input(s): "GLUCAP" in the last 168  hours. Lipid Profile: No results for input(s): "CHOL", "HDL", "LDLCALC", "TRIG", "CHOLHDL", "LDLDIRECT" in the last 72 hours. Thyroid Function Tests: No results for input(s): "TSH", "T4TOTAL", "FREET4", "T3FREE", "THYROIDAB" in the last 72 hours. Anemia Panel: No results for input(s): "VITAMINB12", "FOLATE", "FERRITIN", "TIBC", "IRON", "RETICCTPCT" in the last 72 hours. Sepsis Labs: Recent Labs  Lab 03/21/22 1518 03/22/22 0716 03/22/22 0905 03/23/22 0557  PROCALCITON  --  <0.10 <0.10 <0.10  LATICACIDVEN 1.3 0.9  --   --     Recent Results (from the past 240 hour(s))  Resp panel by RT-PCR (RSV, Flu A&B, Covid) Anterior Nasal Swab     Status: None   Collection Time: 03/19/22  3:07 PM   Specimen: Anterior Nasal Swab  Result Value Ref Range Status   SARS Coronavirus 2 by RT PCR NEGATIVE NEGATIVE Final    Comment: (NOTE) SARS-CoV-2 target nucleic acids are NOT DETECTED.  The SARS-CoV-2 RNA is generally detectable in upper respiratory specimens during the acute phase of infection. The lowest concentration of SARS-CoV-2 viral copies this assay can detect is 138 copies/mL. A negative result does not preclude SARS-Cov-2 infection and should not be used as the sole basis for treatment or other patient management decisions. A negative result may occur with  improper specimen collection/handling, submission of specimen other than nasopharyngeal swab, presence of viral mutation(s) within the areas targeted by this assay, and inadequate number of viral copies(<138 copies/mL). A negative result must be combined with clinical observations, patient history, and epidemiological information. The expected result is Negative.  Fact Sheet for Patients:  EntrepreneurPulse.com.au  Fact Sheet for Healthcare Providers:  IncredibleEmployment.be  This test is no t yet approved or cleared by the [place] FDA and  has been authorized for detection and/or  diagnosis of SARS-CoV-2 by FDA under an Emergency Use Authorization (EUA). This EUA will remain  in effect (meaning this test can be used) for the duration of the COVID-19 declaration under Section 564(b)(1) of the Act, 21 U.S.C.section 360bbb-3(b)(1), unless the authorization is terminated  or revoked sooner.       Influenza A by PCR NEGATIVE NEGATIVE Final   Influenza B by PCR NEGATIVE NEGATIVE Final    Comment: (NOTE) The Xpert Xpress SARS-CoV-2/FLU/RSV plus assay is intended as an aid in the diagnosis of influenza from Nasopharyngeal swab specimens and should not be used as a sole basis for treatment. Nasal washings and aspirates are unacceptable for Xpert Xpress SARS-CoV-2/FLU/RSV testing.  Fact Sheet for Patients: EntrepreneurPulse.com.au  Fact Sheet for Healthcare Providers: IncredibleEmployment.be  This test is not yet approved or cleared by the [place] FDA and has been authorized for detection and/or diagnosis of SARS-CoV-2 by FDA under an Emergency Use Authorization (EUA). This EUA will remain in effect (meaning this test can be used) for the duration of the COVID-19 declaration under Section 564(b)(1) of the Act, 21 U.S.C. section 360bbb-3(b)(1), unless the authorization is terminated  or revoked.     Resp Syncytial Virus by PCR NEGATIVE NEGATIVE Final    Comment: (NOTE) Fact Sheet for Patients: EntrepreneurPulse.com.au  Fact Sheet for Healthcare Providers: IncredibleEmployment.be  This test is not yet approved or cleared by the [place] FDA and has been authorized for detection and/or diagnosis of SARS-CoV-2 by FDA under an Emergency Use Authorization (EUA). This EUA will remain in effect (meaning this test can be used) for the duration of the COVID-19 declaration under Section 564(b)(1) of the Act, 21 U.S.C. section 360bbb-3(b)(1), unless the authorization is terminated  or revoked.  Performed at [facility], [address] [place], [place] [address]   Resp panel by RT-PCR (RSV, Flu A&B, Covid) Anterior Nasal Swab     Status: None   Collection Time: 03/21/22  1:53 PM   Specimen: Anterior Nasal Swab  Result Value Ref Range Status   SARS Coronavirus 2 by RT PCR NEGATIVE NEGATIVE Final    Comment: (NOTE) SARS-CoV-2 target nucleic acids are NOT DETECTED.  The SARS-CoV-2 RNA is generally detectable in upper respiratory specimens during the acute phase of infection. The lowest concentration of SARS-CoV-2 viral copies this assay can detect is 138 copies/mL. A negative result does not preclude SARS-Cov-2 infection and should not be used as the sole basis for treatment or other patient management decisions. A negative result may occur with  improper specimen collection/handling, submission of specimen other than nasopharyngeal swab, presence of viral mutation(s) within the areas targeted by this assay, and inadequate number of viral copies(<138 copies/mL). A negative result must be combined with clinical observations, patient history, and epidemiological information. The expected result is Negative.  Fact Sheet for Patients:  EntrepreneurPulse.com.au  Fact Sheet for Healthcare Providers:  IncredibleEmployment.be  This test is no t yet approved or cleared by the [place] FDA and  has been authorized for detection and/or diagnosis of SARS-CoV-2 by FDA under an Emergency Use Authorization (EUA). This EUA will remain  in effect (meaning this test can be used) for the duration of the COVID-19 declaration under Section 564(b)(1) of the Act, 21 U.S.C.section 360bbb-3(b)(1), unless the authorization is terminated  or revoked sooner.       Influenza A by PCR NEGATIVE NEGATIVE Final   Influenza B by PCR NEGATIVE NEGATIVE Final    Comment: (NOTE) The Xpert Xpress SARS-CoV-2/FLU/RSV plus assay is  intended as an aid in the diagnosis of influenza from Nasopharyngeal swab specimens and should not be used as a sole basis for treatment. Nasal washings and aspirates are unacceptable for Xpert Xpress SARS-CoV-2/FLU/RSV testing.  Fact Sheet for Patients: EntrepreneurPulse.com.au  Fact Sheet for Healthcare Providers: IncredibleEmployment.be  This test is not yet approved or cleared by the [place] FDA and has been authorized for detection and/or diagnosis of SARS-CoV-2 by FDA under an Emergency Use Authorization (EUA). This EUA will remain in effect (meaning this test can be used) for the duration of the COVID-19 declaration under Section 564(b)(1) of the Act, 21 U.S.C. section 360bbb-3(b)(1), unless the authorization is terminated or revoked.     Resp Syncytial Virus by PCR NEGATIVE NEGATIVE Final    Comment: (NOTE) Fact Sheet for Patients: EntrepreneurPulse.com.au  Fact Sheet for Healthcare Providers: IncredibleEmployment.be  This test is not yet approved or cleared by the [place] FDA and has been authorized for detection and/or diagnosis of SARS-CoV-2 by FDA under an Emergency Use Authorization (EUA). This EUA will remain in effect (meaning this test can be used) for the duration of the  COVID-19 declaration under Section 564(b)(1) of the Act, 21 U.S.C. section 360bbb-3(b)(1), unless the authorization is terminated or revoked.  Performed at [facility], [place] [address] [place], [place] [address]   Respiratory (~20 pathogens) panel by PCR     Status: None   Collection Time: 03/21/22  4:16 PM   Specimen: Nasopharyngeal Swab; Respiratory  Result Value Ref Range Status   Adenovirus NOT DETECTED NOT DETECTED Final   Coronavirus 229E NOT DETECTED NOT DETECTED Final    Comment: (NOTE) The Coronavirus on the Respiratory Panel, DOES NOT test for the novel  Coronavirus (2019  nCoV)    Coronavirus HKU1 NOT DETECTED NOT DETECTED Final   Coronavirus NL63 NOT DETECTED NOT DETECTED Final   Coronavirus OC43 NOT DETECTED NOT DETECTED Final   Metapneumovirus NOT DETECTED NOT DETECTED Final   Rhinovirus / Enterovirus NOT DETECTED NOT DETECTED Final   Influenza A NOT DETECTED NOT DETECTED Final   Influenza B NOT DETECTED NOT DETECTED Final   Parainfluenza Virus 1 NOT DETECTED NOT DETECTED Final   Parainfluenza Virus 2 NOT DETECTED NOT DETECTED Final   Parainfluenza Virus 3 NOT DETECTED NOT DETECTED Final   Parainfluenza Virus 4 NOT DETECTED NOT DETECTED Final   Respiratory Syncytial Virus NOT DETECTED NOT DETECTED Final   Bordetella pertussis NOT DETECTED NOT DETECTED Final   Bordetella Parapertussis NOT DETECTED NOT DETECTED Final   Chlamydophila pneumoniae NOT DETECTED NOT DETECTED Final   Mycoplasma pneumoniae NOT DETECTED NOT DETECTED Final    Comment: Performed at [facility], [place]. [address], [place], [place] [address]  Urine Culture     Status: Abnormal   Collection Time: 03/21/22  7:25 PM   Specimen: Urine, Clean Catch  Result Value Ref Range Status   Specimen Description   Final    URINE, CLEAN CATCH Performed at [facility], [place] [address] [place], [place] [address]    Special Requests   Final    NONE Performed at [facility], [place] [address], [place], [place] [address]    Culture 30,000 COLONIES/mL ESCHERICHIA COLI (A)  Final   Report Status 03/24/2022 FINAL  Final   Organism ID, Bacteria ESCHERICHIA COLI (A)  Final      Susceptibility   Escherichia coli - MIC*    AMPICILLIN 8 SENSITIVE Sensitive     CEFAZOLIN <=4 SENSITIVE Sensitive     CEFEPIME <=0.12 SENSITIVE Sensitive     CEFTRIAXONE <=0.25 SENSITIVE Sensitive     CIPROFLOXACIN <=0.25 SENSITIVE Sensitive     GENTAMICIN <=1 SENSITIVE Sensitive     IMIPENEM <=0.25 SENSITIVE Sensitive     NITROFURANTOIN <=16 SENSITIVE Sensitive     TRIMETH/SULFA  <=20 SENSITIVE Sensitive     AMPICILLIN/SULBACTAM 4 SENSITIVE Sensitive     PIP/TAZO <=4 SENSITIVE Sensitive     * 30,000 COLONIES/mL ESCHERICHIA COLI         Radiology Studies: No results found.      Scheduled Meds:  atorvastatin  40 mg Oral Daily   budesonide (PULMICORT) nebulizer solution  0.5 mg Nebulization BID   Chlorhexidine Gluconate Cloth  6 each Topical Daily   cholecalciferol  2,000 Units Oral Daily   enoxaparin (LOVENOX) injection  40 mg Subcutaneous Q24H   levalbuterol  0.63 mg Nebulization BID   verapamil  360 mg Oral QHS   Continuous Infusions:  cefTRIAXone (ROCEPHIN)  IV 1 g (03/23/22 1632)     LOS: 3 days    Time spent: 35 minutes  Barb Merino, MD [facility] Pager [phone]

## 2022-03-24 NOTE — TOC Progression Note (Signed)
Transition of Care [facility]) - Progression Note    Patient Details  Name: Mary [name] MRN: [id number] Date of Birth: May 17, 1926  Transition [facility] Care [facility]) CM/SW [place], RN Phone [phone]  03/24/2022, 11:33 AM  Clinical Narrative:    CM at bedside to give daughter bed offers. Daughter would like [facility]. CM spoke with Britney in admissions, [facility] can accept patient today. CM has sent message to MD.    Expected Discharge Plan: [place] Barriers to Discharge: SNF Pending bed offer  Expected Discharge Plan and Services In-house Referral: NA Discharge Planning Services: CM Consult Post Acute Care Choice: [place] Living arrangements for the past 2 months: Single Family Home                 DME Arranged: N/A DME Agency: NA       HH Arranged: NA           Social Determinants of Health (SDOH) Interventions SDOH Screenings   Food Insecurity: No Food Insecurity (03/22/2022)  Housing: Low Risk  (03/22/2022)  Transportation Needs: No Transportation Needs (03/22/2022)  Utilities: Not At Risk (03/22/2022)  Depression (PHQ2-9): Low Risk  (07/18/2021)  Tobacco Use: Low Risk  (03/22/2022)    Readmission Risk Interventions     No data to display

## 2022-03-24 NOTE — TOC Transition Note (Signed)
Transition of Care [facility]) - CM/SW Discharge Note   Patient Details  Name: Mary [name] MRN: [id number] Date of Birth: Aug 24, 1926  [facility]) CM/SW Contact:  Angelita Ingles, RN Phone [phone]  03/24/2022, 2:27 PM   Clinical Narrative:    CM has received confirmation that patient is goo to come to [facility]. Transportation has been arranged per PTAR. Discharge packet is at nurse station.   Please call report to [facility] Room # 401P [phone]     Barriers to Discharge: SNF Pending bed offer   Patient Goals and CMS Choice CMS Medicare.gov Compare Post Acute Care list provided to:: Patient Represenative (must comment) Choice offered to / list presented to : Patient, Adult Children  Discharge Placement                         Discharge Plan and Services Additional resources added to the After Visit Summary for   In-house Referral: NA Discharge Planning Services: CM Consult Post Acute Care Choice: [place]          DME Arranged: N/A DME Agency: NA       HH Arranged: NA          Social Determinants of Health (SDOH) Interventions SDOH Screenings   Food Insecurity: No Food Insecurity (03/22/2022)  Housing: Low Risk  (03/22/2022)  Transportation Needs: No Transportation Needs (03/22/2022)  Utilities: Not At Risk (03/22/2022)  Depression (PHQ2-9): Low Risk  (07/18/2021)  Tobacco Use: Low Risk  (03/22/2022)     Readmission Risk Interventions     No data to display

## 2022-03-29 DIAGNOSIS — N312 Flaccid neuropathic bladder, not elsewhere classified: Secondary | ICD-10-CM | POA: Diagnosis not present

## 2022-03-29 DIAGNOSIS — R2689 Other abnormalities of gait and mobility: Secondary | ICD-10-CM | POA: Diagnosis not present

## 2022-03-29 DIAGNOSIS — I10 Essential (primary) hypertension: Secondary | ICD-10-CM | POA: Diagnosis not present

## 2022-03-29 DIAGNOSIS — J069 Acute upper respiratory infection, unspecified: Secondary | ICD-10-CM | POA: Diagnosis not present

## 2022-03-30 DIAGNOSIS — R531 Weakness: Secondary | ICD-10-CM

## 2022-03-30 DIAGNOSIS — I10 Essential (primary) hypertension: Secondary | ICD-10-CM

## 2022-03-30 DIAGNOSIS — M1611 Unilateral primary osteoarthritis, right hip: Secondary | ICD-10-CM | POA: Diagnosis not present

## 2022-03-30 DIAGNOSIS — E785 Hyperlipidemia, unspecified: Secondary | ICD-10-CM

## 2022-03-31 DIAGNOSIS — N312 Flaccid neuropathic bladder, not elsewhere classified: Secondary | ICD-10-CM | POA: Diagnosis not present

## 2022-03-31 DIAGNOSIS — Z7689 Persons encountering health services in other specified circumstances: Secondary | ICD-10-CM | POA: Diagnosis not present

## 2022-03-31 DIAGNOSIS — K5900 Constipation, unspecified: Secondary | ICD-10-CM | POA: Diagnosis not present

## 2022-04-05 DIAGNOSIS — M25551 Pain in right hip: Secondary | ICD-10-CM | POA: Diagnosis not present

## 2022-04-06 ENCOUNTER — Encounter: Payer: Self-pay | Admitting: Oncology

## 2022-04-07 DIAGNOSIS — R2689 Other abnormalities of gait and mobility: Secondary | ICD-10-CM

## 2022-04-07 DIAGNOSIS — R600 Localized edema: Secondary | ICD-10-CM

## 2022-04-07 DIAGNOSIS — M6281 Muscle weakness (generalized): Secondary | ICD-10-CM

## 2022-04-09 DIAGNOSIS — R5381 Other malaise: Secondary | ICD-10-CM | POA: Diagnosis not present

## 2022-04-09 DIAGNOSIS — J069 Acute upper respiratory infection, unspecified: Secondary | ICD-10-CM | POA: Diagnosis not present

## 2022-04-09 DIAGNOSIS — Z9089 Acquired absence of other organs: Secondary | ICD-10-CM | POA: Diagnosis not present

## 2022-04-09 DIAGNOSIS — Z7951 Long term (current) use of inhaled steroids: Secondary | ICD-10-CM | POA: Diagnosis not present

## 2022-04-09 DIAGNOSIS — M1990 Unspecified osteoarthritis, unspecified site: Secondary | ICD-10-CM | POA: Diagnosis not present

## 2022-04-09 DIAGNOSIS — I10 Essential (primary) hypertension: Secondary | ICD-10-CM | POA: Diagnosis not present

## 2022-04-09 DIAGNOSIS — Z96651 Presence of right artificial knee joint: Secondary | ICD-10-CM | POA: Diagnosis not present

## 2022-04-09 DIAGNOSIS — Z9350 Unspecified cystostomy status: Secondary | ICD-10-CM | POA: Diagnosis not present

## 2022-04-09 DIAGNOSIS — Z960 Presence of urogenital implants: Secondary | ICD-10-CM | POA: Diagnosis not present

## 2022-04-09 DIAGNOSIS — E876 Hypokalemia: Secondary | ICD-10-CM | POA: Diagnosis not present

## 2022-04-09 DIAGNOSIS — E871 Hypo-osmolality and hyponatremia: Secondary | ICD-10-CM | POA: Diagnosis not present

## 2022-04-09 DIAGNOSIS — Z853 Personal history of malignant neoplasm of breast: Secondary | ICD-10-CM | POA: Diagnosis not present

## 2022-04-09 DIAGNOSIS — Z86010 Personal history of colonic polyps: Secondary | ICD-10-CM | POA: Diagnosis not present

## 2022-04-09 DIAGNOSIS — Z7983 Long term (current) use of bisphosphonates: Secondary | ICD-10-CM | POA: Diagnosis not present

## 2022-04-09 DIAGNOSIS — R339 Retention of urine, unspecified: Secondary | ICD-10-CM | POA: Diagnosis not present

## 2022-04-09 DIAGNOSIS — M810 Age-related osteoporosis without current pathological fracture: Secondary | ICD-10-CM | POA: Diagnosis not present

## 2022-04-09 DIAGNOSIS — E782 Mixed hyperlipidemia: Secondary | ICD-10-CM | POA: Diagnosis not present

## 2022-04-09 DIAGNOSIS — N312 Flaccid neuropathic bladder, not elsewhere classified: Secondary | ICD-10-CM | POA: Diagnosis not present

## 2022-04-10 ENCOUNTER — Ambulatory Visit (INDEPENDENT_AMBULATORY_CARE_PROVIDER_SITE_OTHER): Payer: Medicare Other | Admitting: Family Medicine

## 2022-04-10 ENCOUNTER — Other Ambulatory Visit: Payer: Self-pay

## 2022-04-10 ENCOUNTER — Other Ambulatory Visit: Payer: Self-pay | Admitting: *Deleted

## 2022-04-10 ENCOUNTER — Encounter: Payer: Self-pay | Admitting: Family Medicine

## 2022-04-10 ENCOUNTER — Telehealth: Payer: Self-pay

## 2022-04-10 VITALS — BP 142/74 | HR 84 | Ht 62.0 in | Wt 124.0 lb

## 2022-04-10 DIAGNOSIS — Z9359 Other cystostomy status: Secondary | ICD-10-CM

## 2022-04-10 DIAGNOSIS — M159 Polyosteoarthritis, unspecified: Secondary | ICD-10-CM

## 2022-04-10 DIAGNOSIS — K5900 Constipation, unspecified: Secondary | ICD-10-CM | POA: Diagnosis not present

## 2022-04-10 DIAGNOSIS — R002 Palpitations: Secondary | ICD-10-CM | POA: Diagnosis not present

## 2022-04-10 DIAGNOSIS — R600 Localized edema: Secondary | ICD-10-CM

## 2022-04-10 DIAGNOSIS — R531 Weakness: Secondary | ICD-10-CM | POA: Diagnosis not present

## 2022-04-10 DIAGNOSIS — I10 Essential (primary) hypertension: Secondary | ICD-10-CM

## 2022-04-10 DIAGNOSIS — M810 Age-related osteoporosis without current pathological fracture: Secondary | ICD-10-CM

## 2022-04-10 NOTE — Progress Notes (Signed)
Chief Complaint  Patient presents with   Hospitalization Follow-up    Released from rehab and is home, has 24ht care until this Friday. After that she will have care for 4hrs in the am and 4hrs in the evening. Facility re-ordered ALL medications OM:VEHMCNOB, creams etc. All that were prn as well as daily medications. She wants to know what you want her to take daily. Would it be possible to get blister packs for her meds.    Edema    Has been swelling of ankles, L worse than R-is elevating when she can, since she came home Sat am. PT is giving her exercises. Has 4 weeks of PT.    Patient presents for transition of care/hospital follow-up visit. She is accompanied by her daughter Deloris Ping and son-in-law.  They brought medication list from [facility]. Chief complaint is some swelling in the legs, and making sure that she is eating adequately.  She was hospitalized 1/2-03/24/22 with viral URI with suspected secondary bacterial infection, and extreme debility. She was treated with Rocephin x 3d, chest PT, mucolyics and bronchodilators.  Cultures were negative.  She was discharged to [facility] for skilled nursing care.  She was discharged from [facility] on 04/08/22.  Her children liver in [place] and [place], and have all been here to visit at different times, since her illness.  Deloris Ping is here currently. Home health has started. She hs 24/7 care at home through Friday, then will have two four-hour shifts/day (9-1, 4-8). Family has been bringing her all her food, even at [facility], as she didn't like the food. She tends to be a picky eater (not liking bland food, is from [place] and likes some spice), doesn't like cook, and doesn't usually like leftovers.  She typically will have sandwiches for lunch, or will go out for a hot meal ([employer], soups). Doesn't eat much dinner.  Doesn't like to cook.  Family is asking about options (they aren't from the area), and delivery.  She reports that the swelling in her legs has  been there since Friday (prior to discharge from [facility]), L>R ankle.  It is only slight in the morning, worsens as the day goes on.  She has been doing home exercises, walking around the house.  She has an ottoman to rest her feet on. She sleeps with her head elevated (for many many years). Apparently someone at [facility] mentioned compression stockings, but they didn't get them prior to discharge.  She doesn't think she will be able to put them on easily (but will have help at home).  We discussed the sodium content in her diet.  She saw Dr. Wynelle Link for her R hip arthritis on 03/30/22.  Per his notes, she can have another intra-articular injection, which has helped in the past. He also suggested Meloxicam (to get while at [facility]). He mentioned referring her to either Dr. Nelva Bush or Dr. Deniece Portela for an intra-articular injection but also for pain management going forward at the patient's and family''s request as surgery is not going to be an option.  We have no notes from the facility.  Patient states she got an injection by Dr. Nelva Bush on 1/17 while at [facility]. She has also been taking Meloxicam '15mg'$  daily, which has been helpful. She denies any abdominal pain or bruising.  This med was refilled upon discharge by facility.   PMH, PSH, SH reviewed  Outpatient Encounter Medications as of 04/10/2022  Medication Sig Note   acetaminophen (TYLENOL) 500 MG tablet Take 1,000 mg  by mouth 3 (three) times daily. 04/10/2022: Taking '1000mg'$  TID   atorvastatin (LIPITOR) 40 MG tablet Take 1 tablet (40 mg total) by mouth daily.    Cholecalciferol (VITAMIN D3) 50 MCG (2000 UT) TABS Take 2,000 Units by mouth daily.    denosumab (PROLIA) 60 MG/ML SOLN injection Inject 60 mg into the skin every 6 (six) months. Administer in upper arm, thigh, or abdomen    meloxicam (MOBIC) 15 MG tablet Take 15 mg by mouth daily.    Multiple Vitamins-Minerals (PRESERVISION AREDS 2) CAPS Take 1 capsule by mouth 2 (two) times daily.     Polyvinyl Alcohol-Povidone (REFRESH OP) Place 1 drop into both eyes 2 (two) times daily.    verapamil (VERELAN PM) 360 MG 24 hr capsule Take 1 capsule (360 mg total) by mouth at bedtime. (Patient taking differently: Take 360 mg by mouth daily.)    albuterol (VENTOLIN HFA) 108 (90 Base) MCG/ACT inhaler Inhale 2 puffs into the lungs every 6 (six) hours as needed for wheezing or shortness of breath. (Patient not taking: Reported on 04/10/2022) 04/10/2022: prn   benzonatate (TESSALON) 100 MG capsule Take 1 capsule (100 mg total) by mouth 3 (three) times daily as needed for cough. (Patient not taking: Reported on 04/10/2022) 04/10/2022: Not using but was re-ordered   Calcium Carbonate-Vitamin D (CALCIUM 600+D PO) Take 1 tablet by mouth daily. (Patient not taking: Reported on 04/10/2022) 04/10/2022: Caltrate is sometimes too hard to swallow, so doesn't take every day.  She hasn't been taking since discharge from [place]   fluticasone (CUTIVATE) 0.005 % ointment Apply 1 Application topically 2 (two) times daily. (Patient not taking: Reported on 04/10/2022) 04/10/2022: prn   hydrocortisone (ANUSOL-HC) 2.5 % rectal cream Place 1 application. rectally 2 (two) times daily. (Patient not taking: Reported on 04/10/2022) 01/16/2022: prn   loratadine (CLARITIN) 10 MG tablet Take 10 mg by mouth daily as needed for allergies. (Patient not taking: Reported on 04/10/2022) 04/10/2022: Unsure if she needs to take   polyethylene glycol (MIRALAX / GLYCOLAX) packet Take 17 g by mouth daily as needed for moderate constipation. (Patient not taking: Reported on 04/10/2022) 04/10/2022: prn   [DISCONTINUED] guaiFENesin-dextromethorphan (ROBITUSSIN DM) 100-10 MG/5ML syrup Take 10 mLs by mouth every 4 (four) hours as needed for cough.    [DISCONTINUED] ibuprofen (ADVIL) 200 MG tablet Take 200 mg by mouth every 6 (six) hours as needed for moderate pain. (Patient not taking: Reported on 04/10/2022)    No facility-administered encounter  medications on file as of 04/10/2022.   She is taking two of verapamil '180mg'$  (d/c med from rehab). She has not been taking her calcium pills (wasn't on the d/c meds)--she has Caltrate at home but has a hard time swallowing them.  She eats cottage cheese and milk daily (1/2 glass, occ more, 2% milk).  Allergies  Allergen Reactions   Arimidex [Anastrozole] Rash   Avelox [Moxifloxacin Hcl In Nacl] Nausea Only and Rash   Azithromycin Nausea Only and Rash   Bactrim Nausea Only and Rash   Biaxin [Clarithromycin] Nausea Only and Rash   Nitrofurantoin Nausea Only, Rash and Other (See Comments)   Penicillins Nausea Only and Rash    Has patient had a PCN reaction causing immediate rash, facial/tongue/throat swelling, SOB or lightheadedness with hypotension: no Has patient had a PCN reaction causing severe rash involving mucus membranes or skin necrosis: no Has patient had a PCN reaction that required hospitalization no Has patient had a PCN reaction occurring within the last 10 years  no If all of the above answers are "NO", then may proceed with Cephalosporin use.    Sulfa Antibiotics Nausea And Vomiting and Rash   Sulfur Nausea And Vomiting and Rash   ROS: no fever, chills, URI symptoms (all resolved).  No headaches, dizziness, chest pain.  Edema per HPI.  Energy/weight improved. No urinary issues or issues with catheter. No skin rashes, wounds, or bruising.   PHYSICAL EXAM:  BP (!) 142/74   Pulse 84 Comment: irregular  Ht '5\' 2"'$  (1.575 m)   Wt 124 lb (56.2 kg)   LMP  (LMP Unknown)   BMI 22.68 kg/m   Wt Readings from Last 3 Encounters:  04/10/22 124 lb (56.2 kg)  03/22/22 119 lb 7.8 oz (54.2 kg)  01/16/22 123 lb 6.4 oz (56 kg)   Well-appearing, elderly female, in good spirits. HEENT: conjunctiva and sclera are clear, EOMI, OP clear Neck: no lymphadenopathy or mass Heart: normal rate, frequent ectopy, irregular. No murmur Lungs: clear bilaterally Abdomen: soft,  nontender Extremities: normal pulses. 1+ edema L>R at ankles, trace pretibial. Neuro: alert and oriented, cranial nerves grossly intact.    ASSESSMENT/PLAN:  If edema isn't improving despite low Na diet, elevation and compression, can add diuretic.  May also want to have her get a repeat EKG (to ensure that it is still sinus rhythm and not afib).  Discussed low Na diet in detail (hers is quite high, between soups, lunch meats, [place] food, and other meals she eats). Discussed seasoning with other herbs/garlic/onion to add flavor, rather than salty fods.  Discussed options for food--can have her caregivers assist her in doing grocery orders online, either for delivery or pickup at [employer].  Discussed picking up rotisserie chicken, vegetables.  Discussed some heart healthy frozen dinners (she didn't like this idea at all, stating they would be bland, have no flavor, which is when we discussed added seasoning, not salt). Given info on low Na diet and reading labels.  Discussed calcium intake in detail (dietary and supplements).  We discussed her pain meds in detail--taking meloxicam '15mg'$  daily, ?how to know if she got benefit from the intra-articular injection.  Encouraged her to STOP the meloxicam, to see if needed. Continue with TID tylenol.  Discussed risks of NSAIDs. At her age, encouraged no more than 7.'5mg'$  (1/2 tablet, since she has large supply), taken with food. And if needing it daily, she should be on a med to protect from GI bleed (pepcid vs OTC PPI). Discussed risks in detail, as well as monitoring of renal function.  She is to keep her May visit, and labs will be rechecked at that time.  All questions were answered.  The issue of blister packs for pills didn't come up in the hour spent with the patient/family.  Will contact her with more info (see if she is willing to see [facility] pharmacist to discuss, and set up either with [facility] vs other pharmacy that does this). Mostly spent  the time answering WHICH meds she needed and which she didn't.  Spent >1 hour face to face, plus additional 15-20 minutes in chart review and documentation. (Total time 80 minutes)  All questions were answered.  Please wear compression socks during the day (15-30 mm Hg). Put them on in the morning, and take them off in the evening. You can get someone to help you put them on. You can also elevate your legs (above the level of your heart), to help lower the swelling. Using a pillow under  your legs when elevated on the ottoman will help. Continue to get regular exercise throughout the day. Limiting the sodium in the diet (read labs, limit process foods, lunch meats, etc) will also help with swelling (and blood pressure).  Please try NOT taking the meloxicam, to see if the injection has helped your pain or not. If you are still having pain, or if/when the pain recurs, try 1/2 of the '15mg'$  meloxicam WITH FOOD, once daliy, or just as needed.   If the 1/2 tablet is not adequate in treating your pain, you can take a full one, but try not to take a full one daily longterm. If you need to take meloxicam daily long-term (either 1/2 pill or the full), you should be taking something to protect your stomach (pepcid or prilosec OTC, these are available over-the-counter).  Continue to take your tylenol regularly.  Try and drink 1.5-2 glasses of milk daily, and buy the calcium-fortified version of Lactaid (2%). Continue your cottage cheese daily. If you are not drinking 2 glasses of the extra fortified milk daily, then you should take a calcium pill once daily. You may want to find a small calcium pill (there is one that is a lower dose, that is smaller, and you take TWO of them instead of one).  You do not need to be taking claritin now.  You may safely take that if you develop any allergy symptoms (sneezing, runny nose, etc).  Do NOT take the bisacoydl that you got on discharge.  Instead, resume taking  the miralax daily in your coffee.  Contact us if your swelling isn't going down in the mornings, or if your swelling is getting worse, especially if there is any breaks in the skin.

## 2022-04-10 NOTE — Patient Instructions (Signed)
Please wear compression socks during the day (15-30 mm Hg). Put them on in the morning, and take them off in the evening. You can get someone to help you put them on. You can also elevate your legs (above the level of your heart), to help lower the swelling. Using a pillow under your legs when elevated on the ottoman will help. Continue to get regular exercise throughout the day. Limiting the sodium in the diet (read labs, limit process foods, lunch meats, etc) will also help with swelling (and blood pressure).  Please try NOT taking the meloxicam, to see if the injection has helped your pain or not. If you are still having pain, or if/when the pain recurs, try 1/2 of the '15mg'$  meloxicam WITH FOOD, once daliy, or just as needed.   If the 1/2 tablet is not adequate in treating your pain, you can take a full one, but try not to take a full one daily longterm. If you need to take meloxicam daily long-term (either 1/2 pill or the full), you should be taking something to protect your stomach (pepcid or prilosec OTC, these are available over-the-counter).  Continue to take your tylenol regularly.  Try and drink 1.5-2 glasses of milk daily, and buy the calcium-fortified version of Lactaid (2%). Continue your cottage cheese daily. If you are not drinking 2 glasses of the extra fortified milk daily, then you should take a calcium pill once daily. You may want to find a small calcium pill (there is one that is a lower dose, that is smaller, and you take TWO of them instead of one).  You do not need to be taking claritin now.  You may safely take that if you develop any allergy symptoms (sneezing, runny nose, etc).  Do NOT take the bisacoydl that you got on discharge.  Instead, resume taking the miralax daily in your coffee.  Contact us if your swelling isn't going down in the mornings, or if your swelling is getting worse, especially if there is any breaks in the skin.

## 2022-04-10 NOTE — Patient Outreach (Signed)
Mrs. Swearengin discharged from [facility] facility on 04/08/22. Screening for potential [place] care coordination services as benefit of insurance plan and Primary Care Provider.   Telephone call made to Mrs. Gwenlyn Found [phone]. Spoke with daughter. Mrs. Schweppe has 24/7 care at home. Home health has started. Scheduled to see Primary Care Provider this afternoon.   No identifiable [facility] care coordination needs at this time.   Marthenia Rolling, MSN, RN,BSN [facility] Acute Care Coordinator [phone] (Direct dial)

## 2022-04-10 NOTE — Telephone Encounter (Signed)
Transition Care Management Follow-up Telephone Call Date of discharge and from where: [facility] facility,  How have you been since you were released from the hospital? [facility] but is coming in today.  Any questions or concerns? No  Items Reviewed: Did the pt receive and understand the discharge instructions provided? Yes  Medications obtained and verified? Yes  Other? No  Any new allergies since your discharge? No  Dietary orders reviewed? Yes Do you have support at home? Yes   Home Care and Equipment/Supplies: Were home health services ordered? no  Follow up appointments reviewed:  PCP Hospital f/u appt confirmed? Yes  Scheduled to see Dr. Tomi Bamberger on 04/10/22 @ 3:45. [place] [facility] f/u appt confirmed? No   Are transportation arrangements needed? No  If their condition worsens, is the pt aware to call PCP or go to the Emergency Dept.? Yes Was the patient provided with contact information for the PCP's office or ED? Yes Was to pt encouraged to call back with questions or concerns? Yes

## 2022-04-11 DIAGNOSIS — H26491 Other secondary cataract, right eye: Secondary | ICD-10-CM | POA: Diagnosis not present

## 2022-04-13 ENCOUNTER — Other Ambulatory Visit: Payer: Self-pay | Admitting: *Deleted

## 2022-04-13 DIAGNOSIS — R002 Palpitations: Secondary | ICD-10-CM

## 2022-04-13 DIAGNOSIS — E871 Hypo-osmolality and hyponatremia: Secondary | ICD-10-CM | POA: Diagnosis not present

## 2022-04-13 DIAGNOSIS — E782 Mixed hyperlipidemia: Secondary | ICD-10-CM

## 2022-04-13 DIAGNOSIS — E876 Hypokalemia: Secondary | ICD-10-CM | POA: Diagnosis not present

## 2022-04-13 DIAGNOSIS — R5381 Other malaise: Secondary | ICD-10-CM | POA: Diagnosis not present

## 2022-04-13 DIAGNOSIS — M1990 Unspecified osteoarthritis, unspecified site: Secondary | ICD-10-CM | POA: Diagnosis not present

## 2022-04-13 DIAGNOSIS — I10 Essential (primary) hypertension: Secondary | ICD-10-CM | POA: Diagnosis not present

## 2022-04-13 DIAGNOSIS — J069 Acute upper respiratory infection, unspecified: Secondary | ICD-10-CM | POA: Diagnosis not present

## 2022-04-13 MED ORDER — ATORVASTATIN CALCIUM 40 MG PO TABS: 40.0000 mg | ORAL_TABLET | Freq: Every day | ORAL | 0 refills | Status: DC

## 2022-04-13 MED ORDER — VERAPAMIL HCL ER 360 MG PO CP24: 360.0000 mg | ORAL_CAPSULE | Freq: Every day | ORAL | 0 refills | Status: DC

## 2022-04-18 DIAGNOSIS — R5381 Other malaise: Secondary | ICD-10-CM | POA: Diagnosis not present

## 2022-04-18 DIAGNOSIS — E871 Hypo-osmolality and hyponatremia: Secondary | ICD-10-CM | POA: Diagnosis not present

## 2022-04-18 DIAGNOSIS — I10 Essential (primary) hypertension: Secondary | ICD-10-CM | POA: Diagnosis not present

## 2022-04-18 DIAGNOSIS — J069 Acute upper respiratory infection, unspecified: Secondary | ICD-10-CM | POA: Diagnosis not present

## 2022-04-18 DIAGNOSIS — E876 Hypokalemia: Secondary | ICD-10-CM | POA: Diagnosis not present

## 2022-04-18 DIAGNOSIS — M1990 Unspecified osteoarthritis, unspecified site: Secondary | ICD-10-CM | POA: Diagnosis not present

## 2022-04-25 DIAGNOSIS — E871 Hypo-osmolality and hyponatremia: Secondary | ICD-10-CM | POA: Diagnosis not present

## 2022-04-25 DIAGNOSIS — J069 Acute upper respiratory infection, unspecified: Secondary | ICD-10-CM | POA: Diagnosis not present

## 2022-04-25 DIAGNOSIS — I10 Essential (primary) hypertension: Secondary | ICD-10-CM | POA: Diagnosis not present

## 2022-04-25 DIAGNOSIS — E876 Hypokalemia: Secondary | ICD-10-CM | POA: Diagnosis not present

## 2022-04-25 DIAGNOSIS — M1990 Unspecified osteoarthritis, unspecified site: Secondary | ICD-10-CM | POA: Diagnosis not present

## 2022-04-25 DIAGNOSIS — R5381 Other malaise: Secondary | ICD-10-CM | POA: Diagnosis not present

## 2022-04-28 DIAGNOSIS — I10 Essential (primary) hypertension: Secondary | ICD-10-CM | POA: Diagnosis not present

## 2022-04-28 DIAGNOSIS — M1990 Unspecified osteoarthritis, unspecified site: Secondary | ICD-10-CM | POA: Diagnosis not present

## 2022-04-28 DIAGNOSIS — J069 Acute upper respiratory infection, unspecified: Secondary | ICD-10-CM | POA: Diagnosis not present

## 2022-04-28 DIAGNOSIS — R5381 Other malaise: Secondary | ICD-10-CM | POA: Diagnosis not present

## 2022-04-28 DIAGNOSIS — E871 Hypo-osmolality and hyponatremia: Secondary | ICD-10-CM | POA: Diagnosis not present

## 2022-04-28 DIAGNOSIS — E876 Hypokalemia: Secondary | ICD-10-CM | POA: Diagnosis not present

## 2022-05-01 DIAGNOSIS — N312 Flaccid neuropathic bladder, not elsewhere classified: Secondary | ICD-10-CM | POA: Diagnosis not present

## 2022-05-03 DIAGNOSIS — I10 Essential (primary) hypertension: Secondary | ICD-10-CM | POA: Diagnosis not present

## 2022-05-03 DIAGNOSIS — E871 Hypo-osmolality and hyponatremia: Secondary | ICD-10-CM | POA: Diagnosis not present

## 2022-05-03 DIAGNOSIS — M1990 Unspecified osteoarthritis, unspecified site: Secondary | ICD-10-CM | POA: Diagnosis not present

## 2022-05-03 DIAGNOSIS — E876 Hypokalemia: Secondary | ICD-10-CM | POA: Diagnosis not present

## 2022-05-03 DIAGNOSIS — R5381 Other malaise: Secondary | ICD-10-CM | POA: Diagnosis not present

## 2022-05-03 DIAGNOSIS — J069 Acute upper respiratory infection, unspecified: Secondary | ICD-10-CM | POA: Diagnosis not present

## 2022-05-09 DIAGNOSIS — N312 Flaccid neuropathic bladder, not elsewhere classified: Secondary | ICD-10-CM | POA: Diagnosis not present

## 2022-05-09 DIAGNOSIS — Z853 Personal history of malignant neoplasm of breast: Secondary | ICD-10-CM | POA: Diagnosis not present

## 2022-05-09 DIAGNOSIS — J069 Acute upper respiratory infection, unspecified: Secondary | ICD-10-CM | POA: Diagnosis not present

## 2022-05-09 DIAGNOSIS — E782 Mixed hyperlipidemia: Secondary | ICD-10-CM | POA: Diagnosis not present

## 2022-05-09 DIAGNOSIS — Z86010 Personal history of colonic polyps: Secondary | ICD-10-CM | POA: Diagnosis not present

## 2022-05-09 DIAGNOSIS — R339 Retention of urine, unspecified: Secondary | ICD-10-CM | POA: Diagnosis not present

## 2022-05-09 DIAGNOSIS — Z7983 Long term (current) use of bisphosphonates: Secondary | ICD-10-CM | POA: Diagnosis not present

## 2022-05-09 DIAGNOSIS — Z7951 Long term (current) use of inhaled steroids: Secondary | ICD-10-CM | POA: Diagnosis not present

## 2022-05-09 DIAGNOSIS — Z9089 Acquired absence of other organs: Secondary | ICD-10-CM | POA: Diagnosis not present

## 2022-05-09 DIAGNOSIS — E871 Hypo-osmolality and hyponatremia: Secondary | ICD-10-CM | POA: Diagnosis not present

## 2022-05-09 DIAGNOSIS — M1611 Unilateral primary osteoarthritis, right hip: Secondary | ICD-10-CM | POA: Diagnosis not present

## 2022-05-09 DIAGNOSIS — M1990 Unspecified osteoarthritis, unspecified site: Secondary | ICD-10-CM | POA: Diagnosis not present

## 2022-05-09 DIAGNOSIS — E876 Hypokalemia: Secondary | ICD-10-CM | POA: Diagnosis not present

## 2022-05-09 DIAGNOSIS — Z96651 Presence of right artificial knee joint: Secondary | ICD-10-CM | POA: Diagnosis not present

## 2022-05-09 DIAGNOSIS — I10 Essential (primary) hypertension: Secondary | ICD-10-CM | POA: Diagnosis not present

## 2022-05-09 DIAGNOSIS — Z960 Presence of urogenital implants: Secondary | ICD-10-CM | POA: Diagnosis not present

## 2022-05-09 DIAGNOSIS — R5381 Other malaise: Secondary | ICD-10-CM | POA: Diagnosis not present

## 2022-05-09 DIAGNOSIS — Z9350 Unspecified cystostomy status: Secondary | ICD-10-CM | POA: Diagnosis not present

## 2022-05-09 DIAGNOSIS — M810 Age-related osteoporosis without current pathological fracture: Secondary | ICD-10-CM | POA: Diagnosis not present

## 2022-05-10 ENCOUNTER — Telehealth: Payer: Self-pay | Admitting: *Deleted

## 2022-05-10 DIAGNOSIS — L300 Nummular dermatitis: Secondary | ICD-10-CM

## 2022-05-10 MED ORDER — TRIAMCINOLONE ACETONIDE 0.1 % EX CREA: 1.0000 | TOPICAL_CREAM | Freq: Two times a day (BID) | CUTANEOUS | 0 refills | Status: DC

## 2022-05-10 NOTE — Telephone Encounter (Signed)
Patient called and said she had a very old tube of triamcinolone 0.1% cream that you rx'd for her 08/2016 for a rash around her neck that she uses from time to time for this reason in very small amounts. The rash has flared up and she would like to know if you would please call in a new rx to [facility] for her.

## 2022-05-10 NOTE — Telephone Encounter (Signed)
Patient advised.

## 2022-05-10 NOTE — Telephone Encounter (Signed)
done 

## 2022-05-16 DIAGNOSIS — R5381 Other malaise: Secondary | ICD-10-CM | POA: Diagnosis not present

## 2022-05-16 DIAGNOSIS — I10 Essential (primary) hypertension: Secondary | ICD-10-CM | POA: Diagnosis not present

## 2022-05-16 DIAGNOSIS — E876 Hypokalemia: Secondary | ICD-10-CM | POA: Diagnosis not present

## 2022-05-16 DIAGNOSIS — E871 Hypo-osmolality and hyponatremia: Secondary | ICD-10-CM | POA: Diagnosis not present

## 2022-05-16 DIAGNOSIS — J069 Acute upper respiratory infection, unspecified: Secondary | ICD-10-CM | POA: Diagnosis not present

## 2022-05-16 DIAGNOSIS — M1990 Unspecified osteoarthritis, unspecified site: Secondary | ICD-10-CM | POA: Diagnosis not present

## 2022-05-23 DIAGNOSIS — M1990 Unspecified osteoarthritis, unspecified site: Secondary | ICD-10-CM | POA: Diagnosis not present

## 2022-05-23 DIAGNOSIS — E871 Hypo-osmolality and hyponatremia: Secondary | ICD-10-CM | POA: Diagnosis not present

## 2022-05-23 DIAGNOSIS — I10 Essential (primary) hypertension: Secondary | ICD-10-CM | POA: Diagnosis not present

## 2022-05-23 DIAGNOSIS — R5381 Other malaise: Secondary | ICD-10-CM | POA: Diagnosis not present

## 2022-05-23 DIAGNOSIS — E876 Hypokalemia: Secondary | ICD-10-CM | POA: Diagnosis not present

## 2022-05-23 DIAGNOSIS — J069 Acute upper respiratory infection, unspecified: Secondary | ICD-10-CM | POA: Diagnosis not present

## 2022-05-29 DIAGNOSIS — N312 Flaccid neuropathic bladder, not elsewhere classified: Secondary | ICD-10-CM | POA: Diagnosis not present

## 2022-06-01 ENCOUNTER — Other Ambulatory Visit: Payer: Self-pay | Admitting: Family Medicine

## 2022-06-01 DIAGNOSIS — R002 Palpitations: Secondary | ICD-10-CM

## 2022-06-01 DIAGNOSIS — I10 Essential (primary) hypertension: Secondary | ICD-10-CM

## 2022-06-02 ENCOUNTER — Other Ambulatory Visit: Payer: Self-pay | Admitting: Family Medicine

## 2022-06-02 DIAGNOSIS — L300 Nummular dermatitis: Secondary | ICD-10-CM

## 2022-06-02 NOTE — Telephone Encounter (Signed)
Pt just picked up a refill on this and does not this refill today

## 2022-06-02 NOTE — Telephone Encounter (Signed)
This was just written recently (a few weeks ago).  Did she really finish a whole 30 gram tube and need another one??

## 2022-06-06 DIAGNOSIS — E876 Hypokalemia: Secondary | ICD-10-CM | POA: Diagnosis not present

## 2022-06-06 DIAGNOSIS — R5381 Other malaise: Secondary | ICD-10-CM | POA: Diagnosis not present

## 2022-06-06 DIAGNOSIS — I10 Essential (primary) hypertension: Secondary | ICD-10-CM | POA: Diagnosis not present

## 2022-06-06 DIAGNOSIS — J069 Acute upper respiratory infection, unspecified: Secondary | ICD-10-CM | POA: Diagnosis not present

## 2022-06-06 DIAGNOSIS — E871 Hypo-osmolality and hyponatremia: Secondary | ICD-10-CM | POA: Diagnosis not present

## 2022-06-06 DIAGNOSIS — M1990 Unspecified osteoarthritis, unspecified site: Secondary | ICD-10-CM | POA: Diagnosis not present

## 2022-06-07 ENCOUNTER — Telehealth: Payer: Self-pay | Admitting: Family Medicine

## 2022-06-07 NOTE — Telephone Encounter (Signed)
Mickel Baas from [facility] is calling for verbal orders for home health/PT for 1 time a week for 4 weeks And then 1 time a week every other  for 4 weeks She can be reached at [phone]

## 2022-06-07 NOTE — Telephone Encounter (Signed)
Ok for verbal orders ?

## 2022-06-08 DIAGNOSIS — Z86010 Personal history of colonic polyps: Secondary | ICD-10-CM | POA: Diagnosis not present

## 2022-06-08 DIAGNOSIS — E871 Hypo-osmolality and hyponatremia: Secondary | ICD-10-CM | POA: Diagnosis not present

## 2022-06-08 DIAGNOSIS — R5381 Other malaise: Secondary | ICD-10-CM | POA: Diagnosis not present

## 2022-06-08 DIAGNOSIS — R339 Retention of urine, unspecified: Secondary | ICD-10-CM | POA: Diagnosis not present

## 2022-06-08 DIAGNOSIS — M810 Age-related osteoporosis without current pathological fracture: Secondary | ICD-10-CM | POA: Diagnosis not present

## 2022-06-08 DIAGNOSIS — J069 Acute upper respiratory infection, unspecified: Secondary | ICD-10-CM | POA: Diagnosis not present

## 2022-06-08 DIAGNOSIS — Z96651 Presence of right artificial knee joint: Secondary | ICD-10-CM | POA: Diagnosis not present

## 2022-06-08 DIAGNOSIS — Z7983 Long term (current) use of bisphosphonates: Secondary | ICD-10-CM | POA: Diagnosis not present

## 2022-06-08 DIAGNOSIS — Z9089 Acquired absence of other organs: Secondary | ICD-10-CM | POA: Diagnosis not present

## 2022-06-08 DIAGNOSIS — E876 Hypokalemia: Secondary | ICD-10-CM | POA: Diagnosis not present

## 2022-06-08 DIAGNOSIS — N312 Flaccid neuropathic bladder, not elsewhere classified: Secondary | ICD-10-CM | POA: Diagnosis not present

## 2022-06-08 DIAGNOSIS — Z853 Personal history of malignant neoplasm of breast: Secondary | ICD-10-CM | POA: Diagnosis not present

## 2022-06-08 DIAGNOSIS — Z960 Presence of urogenital implants: Secondary | ICD-10-CM | POA: Diagnosis not present

## 2022-06-08 DIAGNOSIS — I10 Essential (primary) hypertension: Secondary | ICD-10-CM | POA: Diagnosis not present

## 2022-06-08 DIAGNOSIS — Z9350 Unspecified cystostomy status: Secondary | ICD-10-CM | POA: Diagnosis not present

## 2022-06-08 DIAGNOSIS — M1611 Unilateral primary osteoarthritis, right hip: Secondary | ICD-10-CM | POA: Diagnosis not present

## 2022-06-08 DIAGNOSIS — Z7951 Long term (current) use of inhaled steroids: Secondary | ICD-10-CM | POA: Diagnosis not present

## 2022-06-08 DIAGNOSIS — E782 Mixed hyperlipidemia: Secondary | ICD-10-CM | POA: Diagnosis not present

## 2022-06-08 NOTE — Telephone Encounter (Signed)
Verbal orders given  

## 2022-06-13 DIAGNOSIS — M1611 Unilateral primary osteoarthritis, right hip: Secondary | ICD-10-CM | POA: Diagnosis not present

## 2022-06-13 NOTE — Progress Notes (Unsigned)
Start time: End time:  Virtual Visit via Video Note  I connected with Mary [name] on 06/13/22 by a video enabled telemedicine application and verified that I am speaking with the correct person using two identifiers.  Location: Patient: *** Provider: office   I discussed the limitations of evaluation and management by telemedicine and the availability of in person appointments. The patient expressed understanding and agreed to proceed.  History of Present Illness:  No chief complaint on file.     Observations/Objective:  LMP  (LMP Unknown)    Assessment and Plan:  Stop the atorvastatin for the 5 days that you are taking Paxlovid. There is also an interaction wth verapamil--the combination can increase verapamil levels (causing potentially lower blood pressures). Monitor your blood pressure, if your blood pressure drops too low, or you feel dizzy, or pulse is under 50-60, then we will need to lower the dose while on it.     Follow Up Instructions:    I discussed the assessment and treatment plan with the patient. The patient was provided an opportunity to ask questions and all were answered. The patient agreed with the plan and demonstrated an understanding of the instructions.   The patient was advised to call back or seek an in-person evaluation if the symptoms worsen or if the condition fails to improve as anticipated.  I spent *** minutes dedicated to the care of this patient, including pre-visit review of records, face to face time, post-visit ordering of testing and documentation.    [name], MD

## 2022-06-14 ENCOUNTER — Encounter: Payer: Self-pay | Admitting: Family Medicine

## 2022-06-14 ENCOUNTER — Telehealth (INDEPENDENT_AMBULATORY_CARE_PROVIDER_SITE_OTHER): Payer: Medicare Other | Admitting: Family Medicine

## 2022-06-14 VITALS — Temp 98.2°F | Ht 62.0 in | Wt 124.0 lb

## 2022-06-14 DIAGNOSIS — M25551 Pain in right hip: Secondary | ICD-10-CM | POA: Diagnosis not present

## 2022-06-14 DIAGNOSIS — U071 COVID-19: Secondary | ICD-10-CM | POA: Diagnosis not present

## 2022-06-14 DIAGNOSIS — I10 Essential (primary) hypertension: Secondary | ICD-10-CM | POA: Diagnosis not present

## 2022-06-14 DIAGNOSIS — R600 Localized edema: Secondary | ICD-10-CM | POA: Diagnosis not present

## 2022-06-14 MED ORDER — NIRMATRELVIR/RITONAVIR (PAXLOVID)TABLET: 3.0000 | ORAL_TABLET | Freq: Two times a day (BID) | ORAL | 0 refills | Status: AC

## 2022-06-14 NOTE — Patient Instructions (Signed)
Stop the atorvastatin for the 5 days that you are taking Paxlovid. There is also an interaction wth verapamil--the combination can increase verapamil levels (causing potentially lower blood pressures and pulse). Monitor your blood pressure and pulse, if your blood pressure drops too low (under 100/50), or you feel dizzy, or pulse is under 55, then we will need to lower the dose while on it.  We may want to use some lasix (furosemide) to help with the fluid on your legs. We may use this just for a few days to help with the swelling, not long-term. You would take it in the morning (to not interfere with your sleep at night). I don't want to do this now, due to the paxlovid potentially dropping your blood pressure.  Be sure to limit the sodium in your diet, continue using the compression socks and elevating your legs when seated at home.  Guaifenesin is the expectorant found in Mucinex and Robitussin.  Please use this around the clock (in whatever form you can swallow). Dextromethorphan is the cough suppressant found in the DM versions.  This will also help your cough.  Mucinex DM or Robitussin DM. Don't use anything that says "sinus" or "decongestant"--these aren't good for your heart or blood pressure.

## 2022-06-15 ENCOUNTER — Telehealth: Payer: Self-pay

## 2022-06-15 MED ORDER — SPACER/AERO-HOLDING CHAMBERS DEVI: 0 refills | Status: DC

## 2022-06-15 NOTE — Telephone Encounter (Signed)
Sent to pharmacy 

## 2022-06-15 NOTE — Telephone Encounter (Signed)
Spoke with pt's daughter. Mary [name] is needing a prescription for a spacer for her inhaler to be sent to [facility] as she can't efficiently use it anymore due to not being able to get it in her mouth.

## 2022-06-15 NOTE — Addendum Note (Signed)
Addended by: Rita Ohara on: 06/15/2022 11:49 AM   Modules accepted: Level of Service

## 2022-06-20 NOTE — Progress Notes (Signed)
Chief Complaint  Patient presents with   Follow-up    Covid and fall follow up. Right leg still very painful (arthritis). Did not bring a list of bp, pulses or weight). Paxlovid has taken away sense of taste for the most part. Is using albuterol BID is it necessary to still use, and if so can she use without the chamber?    Nevus    Went to see Dr.Gould's PA for mole on L wrist, had frozen twice-do you think she should just have it removed.    Today the patient is accompanied by Mary [name] (daytime caregiver). Speakerphone call with [name], per [name]'s request--unfortunately, her connection was poor. She had difficulty hearing [place], disconnected halfway through visit, asking for detailed AVS that she would read. Her chief concern is that of patient's shortness of breath--she sounded very out of breath when she had spoken to her earlier, when she was getting in/out of car.  Patient was seen for a virtual visit last week, for COVID-19 infection.  She was treated with Paxlovid.   Her respiratory symptoms are much better.  Her chief complaint is that she can't taste her food, and some frustration that the lesion on her left forearm persists despite being frozen twice by her dermatologist.  Recent COVID:  Loss of taste. She coughs up some colorless phlegm. She initially was using albuterol MDI twice daily.  Breathing has gotten better, hasn't used it today at all. Doesn't like the chamber and mask. Hasn't used today, denies any wheezing.  As advised, she held her atorvastatin while taking paxlovid, and has since restarted. We had discussed potential interaction with her blood pressure medication, was advised to monitor her blood pressure and pulse. She reports she did not do this.  But she denies any dizziness during that time. She also denies any chest pain, paroxysmal nocturnal dyspnea, palpitations.  She presents today for further evaluation of LE edema. Her family also noted that she had been getting  short of breath with activity over the last few weeks (prior to COVID infection). They reported she got out of breath getting out of the car last week (this is when she had known COVID infection).  When discussing their concerns of LE edema last week, we discussed getting daily weights to help monitor fluid status.  Patient admits that she refused to do this.  Her prior visit was 1/22, follow-up after discharge from rehab. At that time they had noted swelling in her legs, which had started prior to discharge from [facility], L>R ankle. At that time they reported it was only slight in the morning, worsens as the day goes on. At that time they hadn't yet gotten the compression socks. We had discussed the sodium content in her diet. She reported last week (virtual visit) that they started using compression socks, but that swelling hadn't improved.   She admits that she doesn't like wearing them--she finds them to be very uncomfortable.  Per Zella Ball, the patient HAS worn them daily for the last 2 days, and swelling has significantly improved.  There is very little swelling in the morning, and her legs looked fine at night when the socks were removed. She didn't put them on today. She tries to limit sodium in her diet.  She has a lot of questions regarding what she should eat (she doesn't cook, likes to eat out). She does report having some Honeybaked Ham recently, likes to eat sandwiches. She would like to eat more fish, but doesn't know  where to get it (at restaurants), doesn't particularly like salmon.  She underwent a right hip injection last week.  She reports it helped some, but she still has pain. She took advil after eating, needing to take it twice a day. Also using tylenol.     PMH, PSH, SH reviewed  Outpatient Encounter Medications as of 06/21/2022  Medication Sig Note   acetaminophen (TYLENOL) 500 MG tablet Take 1,000 mg by mouth 3 (three) times daily. 06/21/2022: Took 2 at 10:00   albuterol  (VENTOLIN HFA) 108 (90 Base) MCG/ACT inhaler Inhale 2 puffs into the lungs every 6 (six) hours as needed for wheezing or shortness of breath. 06/21/2022: Has not used yet today, but has been using 1-2 times daily   atorvastatin (LIPITOR) 40 MG tablet Take 1 tablet (40 mg total) by mouth daily.    Calcium Carbonate-Vitamin D (CALCIUM 600+D PO) Take 1 tablet by mouth daily. 04/10/2022: Caltrate is sometimes too hard to swallow, so doesn't take every day.  She hasn't been taking since discharge from [facility]   Cholecalciferol (VITAMIN D3) 50 MCG (2000 UT) TABS Take 2,000 Units by mouth daily.    denosumab (PROLIA) 60 MG/ML SOLN injection Inject 60 mg into the skin every 6 (six) months. Administer in upper arm, thigh, or abdomen    Multiple Vitamins-Minerals (PRESERVISION AREDS 2) CAPS Take 1 capsule by mouth 2 (two) times daily.    Polyvinyl Alcohol-Povidone (REFRESH OP) Place 1 drop into both eyes 2 (two) times daily.    Spacer/Aero-Holding Rudean Curt Use with inhaler    verapamil (VERELAN PM) 360 MG 24 hr capsule Take 1 capsule (360 mg total) by mouth at bedtime.    bisacodyl (DULCOLAX) 5 MG EC tablet Take 5 mg by mouth daily as needed for moderate constipation. (Patient not taking: Reported on 06/21/2022) 06/21/2022: prn   fluticasone (CUTIVATE) 0.005 % ointment Apply 1 Application topically 2 (two) times daily. (Patient not taking: Reported on 04/10/2022) 06/21/2022: prn   hydrocortisone (ANUSOL-HC) 2.5 % rectal cream Place 1 application. rectally 2 (two) times daily. (Patient not taking: Reported on 04/10/2022) 06/21/2022: prn   loratadine (CLARITIN) 10 MG tablet Take 10 mg by mouth daily as needed for allergies. (Patient not taking: Reported on 06/21/2022) 06/21/2022: As needed   [EXPIRED] nirmatrelvir/ritonavir (PAXLOVID) 20 x 150 MG & 10 x  TABS Take 3 tablets by mouth 2 (two) times daily for 5 days. (Take nirmatrelvir 150 mg two tablets twice daily for 5 days and ritonavir 100 mg one tablet twice daily  for 5 days) Patient GFR is >60    polyethylene glycol (MIRALAX / GLYCOLAX) packet Take 17 g by mouth daily as needed for moderate constipation. (Patient not taking: Reported on 04/10/2022) 06/21/2022: As needed   triamcinolone cream (KENALOG) 0.1 % Apply 1 Application topically 2 (two) times daily. (Patient not taking: Reported on 06/14/2022) 06/21/2022: As needed   No facility-administered encounter medications on file as of 06/21/2022.   Allergies  Allergen Reactions   Arimidex [Anastrozole] Rash   Avelox [Moxifloxacin Hcl In Nacl] Nausea Only and Rash   Azithromycin Nausea Only and Rash   Bactrim Nausea Only and Rash   Biaxin [Clarithromycin] Nausea Only and Rash   Nitrofurantoin Nausea Only, Rash and Other (See Comments)   Penicillins Nausea Only and Rash    Has patient had a PCN reaction causing immediate rash, facial/tongue/throat swelling, SOB or lightheadedness with hypotension: no Has patient had a PCN reaction causing severe rash involving mucus membranes or skin  necrosis: no Has patient had a PCN reaction that required hospitalization no Has patient had a PCN reaction occurring within the last 10 years no If all of the above answers are "NO", then may proceed with Cephalosporin use.    Sulfa Antibiotics Nausea And Vomiting and Rash   Sulfur Nausea And Vomiting and Rash    ROS: no fever, chills; URI symptoms significantly improved, per HPI.  She has lost her taste, and this is disturbing to her, so she doesn't have much of an appetite or want to eat, since she can't taste it. No headaches, dizziness, chest pain, palpitations. LE edema per HPI.  Dyspnea is improving, per HPI.    PHYSICAL EXAM:  BP 122/70   Pulse 76   Ht 5\' 2"  (1.575 m)   Wt 121 lb 9.6 oz (55.2 kg)   LMP  (LMP Unknown)   SpO2 97%   BMI 22.24 kg/m   Wt Readings from Last 3 Encounters:  06/21/22 121 lb 9.6 oz (55.2 kg)  06/14/22 124 lb (56.2 kg)  04/10/22 124 lb (56.2 kg)   Pleasant, elderly female, in no  distress. HEENT: conjunctiva and sclera are clear, EOMI. OP clear Neck: no lymphadenopathy or mass Heart: No murmur. Frequent extra beats/pauses, sounded irregularly irregular. Lungs: clear bilaterally, no wheezes, rales, ronchi Abdomen: soft, nontender Extremities: 1+ pitting edema, soft, bilateral. No erythema, induration Skin: intact, no rashes. There was a verrucous skin tag on the L forearm. Neuro: alert and oriented.  Intermittently slightly irritable (annoyed/irritable, frustrated--by lack of taste, discomfort of compression socks, not wanting to do the extra monitoring that was requested). Gait not assessed. Grossly normal strength Psych: some irritability as above, but overall full range of affect, and in good spirits.  Normal hygiene, speech, eye contact, grooming.   EKG:  RBBB, frequent PAC and PVC's. NOT afib (clearly p waves present)--pt reassurred.   ASSESSMENT/PLAN:  Palpitations - on CCB (prev also took digoxin); still with frequent PAC/PVC, but asymptomatic.  ruled out afib w/EKG. Cont current meds - Plan: EKG 12-Lead  Irregular heart rhythm - Plan: EKG 12-Lead  Dyspnea on exertion - exacerbated with COVID, significantly improved.  Pt backed off on albuterol, doing well.  Family concerns likely due to deconditioning. Cont PT  Essential hypertension, benign - controlled with CCB. Reviewed low Na diet  Lower extremity edema - significantly improved with the use of compression socks. Discussed low Na diet, compresion socks and leg elevation   Discussed her LE edema and shortness of breath, potential DDx and work-up including labs, CXR, and possible echo/f/u with cardiology.  We had discussed immediate need for cardiologist if EKG showed afib, which it did not. Once she was reassurred about that, she preferred NOT to have labs done. Lungs were clear and respiratory symptoms are improving, so CXR not indicated at this time.  We discussed her albuterol--she has an inhaler  with aerochamber and mask.  We discussed the apparatus, answered questions--she is willing to use the spacer (to help ensure the delivery of medication to her lungs, rather than spraying her mouth), but that she doesn't need to use the mask. We discussed using this prn--when she feels short of breath, but can use it prior to exertion/physical therapy as well, if these activities cause shortness of breath (rather than only using it as rescue).  We discussed the possibility of still having her see a cardiologist (but not urgently), if she has persistent issues with LE edema and dyspnea.  Warty skin tag--encouraged  to f/u with her dermatologist.  Recommend having it shaved off, rather than a third cryotherapy treatment, to ensure it is removed.  Declines blood tests today.  I spent 45 minutes dedicated to the care of this patient, including pre-visit review of records, face to face time, post-visit ordering of testing and documentation.

## 2022-06-21 ENCOUNTER — Encounter: Payer: Self-pay | Admitting: Family Medicine

## 2022-06-21 ENCOUNTER — Ambulatory Visit (INDEPENDENT_AMBULATORY_CARE_PROVIDER_SITE_OTHER): Payer: Medicare Other | Admitting: Family Medicine

## 2022-06-21 ENCOUNTER — Other Ambulatory Visit: Payer: Self-pay | Admitting: *Deleted

## 2022-06-21 VITALS — BP 122/70 | HR 76 | Ht 62.0 in | Wt 121.6 lb

## 2022-06-21 DIAGNOSIS — J069 Acute upper respiratory infection, unspecified: Secondary | ICD-10-CM | POA: Diagnosis not present

## 2022-06-21 DIAGNOSIS — E876 Hypokalemia: Secondary | ICD-10-CM | POA: Diagnosis not present

## 2022-06-21 DIAGNOSIS — R600 Localized edema: Secondary | ICD-10-CM | POA: Diagnosis not present

## 2022-06-21 DIAGNOSIS — I10 Essential (primary) hypertension: Secondary | ICD-10-CM

## 2022-06-21 DIAGNOSIS — R0609 Other forms of dyspnea: Secondary | ICD-10-CM

## 2022-06-21 DIAGNOSIS — I499 Cardiac arrhythmia, unspecified: Secondary | ICD-10-CM

## 2022-06-21 DIAGNOSIS — R5381 Other malaise: Secondary | ICD-10-CM | POA: Diagnosis not present

## 2022-06-21 DIAGNOSIS — M1611 Unilateral primary osteoarthritis, right hip: Secondary | ICD-10-CM | POA: Diagnosis not present

## 2022-06-21 DIAGNOSIS — R002 Palpitations: Secondary | ICD-10-CM | POA: Diagnosis not present

## 2022-06-21 DIAGNOSIS — E871 Hypo-osmolality and hyponatremia: Secondary | ICD-10-CM | POA: Diagnosis not present

## 2022-06-21 MED ORDER — ALBUTEROL SULFATE HFA 108 (90 BASE) MCG/ACT IN AERS: 2.0000 | INHALATION_SPRAY | Freq: Four times a day (QID) | RESPIRATORY_TRACT | 0 refills | Status: DC | PRN

## 2022-06-21 NOTE — Telephone Encounter (Signed)
Called pharmacy and medicare only pays for Brand and they do not have counter on them. None of the branded albuterol does. Pharmacist found one generic "Teva" that had a counter-I went ahead and sent and we will see how much it costs and if patient is willing to pay.

## 2022-06-21 NOTE — Telephone Encounter (Signed)
I doubt she will need the refill (isn't using it much), but please go ahead and send one in to have on hand just in case. I do not know which ones have the counter on it--you may need to check with the pharmacy

## 2022-06-21 NOTE — Patient Instructions (Addendum)
Since your breathing is better, you can cut back on using the albuterol, and use it just if/when needed. If you find that you are getting winded with activities (such as physical therapy), consider using the inhaler beforehand. You may stop using the mask, but use the chamber, and put it in your mouth (this help deliver the medication to your lungs (rather than accidentally spraying your mouth).  I know that your appetite is poor since your taste is not normal ,but it is important for you to keep up getting adequate nutrition.  You may drink protein shakes (such as Boost) if you aren't eating regular meals.  Avoid eating ham, or other processed meats. This includes lunch meats.  If you are getting the low sodium [place] sliced at the deli, that is better than any other lunch meat.  It sounds as though the compression socks have really helped with your swelling--not having much swelling in the morning, nor much at the end of the day when they are taken off.  I know that you don't find them comfortable, but if you aren't wearing them, your swelling will come back (you saw that today).  Please ALWAYS take advil with food. If you are taking it regularly (twice a day, as you have been the last few days), then you need to also take a medication to protect your stomach, to prevent an ulcer--Prilosec OTC or Pepcid. If you develop any discomfort in your upper stomach (above your belly button), then you need to stop taking the Advil, and come see [place] if the pain doesn't get better (and continue to take the prilosec or advil).  We did an EKG today because your rhythm sounded very irregular--I wanted to rule out atrial fibrillation.  You do NOT have atrial fibrillation.  If you have any persistent shortness of breath, or ongoing swelling, we may need to send you back to cardiology, may need another echocardiogram--these are things that we spoke about during your visit today, but I don't think are needed based on  today's exam.

## 2022-06-23 ENCOUNTER — Telehealth: Payer: Self-pay | Admitting: Internal Medicine

## 2022-06-23 NOTE — Telephone Encounter (Signed)
Mary [name] from [facility] home [facility] called to report a fall on March 23rd per patient. Pt fell, no injury, denied going to hospital. Neighbor helped her up. She was asleep in chair and had a nightmare and got up too quickly.

## 2022-06-26 DIAGNOSIS — N312 Flaccid neuropathic bladder, not elsewhere classified: Secondary | ICD-10-CM | POA: Diagnosis not present

## 2022-06-27 DIAGNOSIS — J069 Acute upper respiratory infection, unspecified: Secondary | ICD-10-CM | POA: Diagnosis not present

## 2022-06-27 DIAGNOSIS — M1611 Unilateral primary osteoarthritis, right hip: Secondary | ICD-10-CM | POA: Diagnosis not present

## 2022-06-27 DIAGNOSIS — E871 Hypo-osmolality and hyponatremia: Secondary | ICD-10-CM | POA: Diagnosis not present

## 2022-06-27 DIAGNOSIS — E876 Hypokalemia: Secondary | ICD-10-CM | POA: Diagnosis not present

## 2022-06-27 DIAGNOSIS — I10 Essential (primary) hypertension: Secondary | ICD-10-CM | POA: Diagnosis not present

## 2022-06-27 DIAGNOSIS — R5381 Other malaise: Secondary | ICD-10-CM | POA: Diagnosis not present

## 2022-07-04 DIAGNOSIS — J069 Acute upper respiratory infection, unspecified: Secondary | ICD-10-CM | POA: Diagnosis not present

## 2022-07-04 DIAGNOSIS — M1611 Unilateral primary osteoarthritis, right hip: Secondary | ICD-10-CM | POA: Diagnosis not present

## 2022-07-04 DIAGNOSIS — R5381 Other malaise: Secondary | ICD-10-CM | POA: Diagnosis not present

## 2022-07-04 DIAGNOSIS — I10 Essential (primary) hypertension: Secondary | ICD-10-CM | POA: Diagnosis not present

## 2022-07-04 DIAGNOSIS — E871 Hypo-osmolality and hyponatremia: Secondary | ICD-10-CM | POA: Diagnosis not present

## 2022-07-04 DIAGNOSIS — E876 Hypokalemia: Secondary | ICD-10-CM | POA: Diagnosis not present

## 2022-07-08 DIAGNOSIS — M810 Age-related osteoporosis without current pathological fracture: Secondary | ICD-10-CM | POA: Diagnosis not present

## 2022-07-08 DIAGNOSIS — Z7983 Long term (current) use of bisphosphonates: Secondary | ICD-10-CM | POA: Diagnosis not present

## 2022-07-08 DIAGNOSIS — Z9350 Unspecified cystostomy status: Secondary | ICD-10-CM | POA: Diagnosis not present

## 2022-07-08 DIAGNOSIS — N312 Flaccid neuropathic bladder, not elsewhere classified: Secondary | ICD-10-CM | POA: Diagnosis not present

## 2022-07-08 DIAGNOSIS — Z9089 Acquired absence of other organs: Secondary | ICD-10-CM | POA: Diagnosis not present

## 2022-07-08 DIAGNOSIS — Z960 Presence of urogenital implants: Secondary | ICD-10-CM | POA: Diagnosis not present

## 2022-07-08 DIAGNOSIS — E782 Mixed hyperlipidemia: Secondary | ICD-10-CM | POA: Diagnosis not present

## 2022-07-08 DIAGNOSIS — Z86010 Personal history of colonic polyps: Secondary | ICD-10-CM | POA: Diagnosis not present

## 2022-07-08 DIAGNOSIS — E876 Hypokalemia: Secondary | ICD-10-CM | POA: Diagnosis not present

## 2022-07-08 DIAGNOSIS — Z853 Personal history of malignant neoplasm of breast: Secondary | ICD-10-CM | POA: Diagnosis not present

## 2022-07-08 DIAGNOSIS — Z96651 Presence of right artificial knee joint: Secondary | ICD-10-CM | POA: Diagnosis not present

## 2022-07-08 DIAGNOSIS — R339 Retention of urine, unspecified: Secondary | ICD-10-CM | POA: Diagnosis not present

## 2022-07-08 DIAGNOSIS — Z7951 Long term (current) use of inhaled steroids: Secondary | ICD-10-CM | POA: Diagnosis not present

## 2022-07-08 DIAGNOSIS — E871 Hypo-osmolality and hyponatremia: Secondary | ICD-10-CM | POA: Diagnosis not present

## 2022-07-08 DIAGNOSIS — R5381 Other malaise: Secondary | ICD-10-CM | POA: Diagnosis not present

## 2022-07-08 DIAGNOSIS — M1611 Unilateral primary osteoarthritis, right hip: Secondary | ICD-10-CM | POA: Diagnosis not present

## 2022-07-08 DIAGNOSIS — J069 Acute upper respiratory infection, unspecified: Secondary | ICD-10-CM | POA: Diagnosis not present

## 2022-07-08 DIAGNOSIS — I10 Essential (primary) hypertension: Secondary | ICD-10-CM | POA: Diagnosis not present

## 2022-07-10 DIAGNOSIS — M1712 Unilateral primary osteoarthritis, left knee: Secondary | ICD-10-CM | POA: Diagnosis not present

## 2022-07-19 DIAGNOSIS — E876 Hypokalemia: Secondary | ICD-10-CM | POA: Diagnosis not present

## 2022-07-19 DIAGNOSIS — J069 Acute upper respiratory infection, unspecified: Secondary | ICD-10-CM | POA: Diagnosis not present

## 2022-07-19 DIAGNOSIS — I10 Essential (primary) hypertension: Secondary | ICD-10-CM | POA: Diagnosis not present

## 2022-07-19 DIAGNOSIS — E871 Hypo-osmolality and hyponatremia: Secondary | ICD-10-CM | POA: Diagnosis not present

## 2022-07-19 DIAGNOSIS — R5381 Other malaise: Secondary | ICD-10-CM | POA: Diagnosis not present

## 2022-07-19 DIAGNOSIS — M1611 Unilateral primary osteoarthritis, right hip: Secondary | ICD-10-CM | POA: Diagnosis not present

## 2022-07-24 ENCOUNTER — Encounter: Payer: Self-pay | Admitting: *Deleted

## 2022-07-24 DIAGNOSIS — N312 Flaccid neuropathic bladder, not elsewhere classified: Secondary | ICD-10-CM | POA: Diagnosis not present

## 2022-07-25 NOTE — Patient Instructions (Incomplete)
HEALTH MAINTENANCE RECOMMENDATIONS:  It is recommended that you get at least 30 minutes of aerobic exercise at least 5 days/week (for weight loss, you may need as much as 60-90 minutes). This can be any activity that gets your heart rate up. This can be divided in 10-15 minute intervals if needed, but try and build up your endurance at least once a week.  Weight bearing exercise is also recommended twice weekly.  Eat a healthy diet with lots of vegetables, fruits and fiber.  "Colorful" foods have a lot of vitamins (ie green vegetables, tomatoes, red peppers, etc).  Limit sweet tea, regular sodas and alcoholic beverages, all of which has a lot of calories and sugar.  Up to 1 alcoholic drink daily may be beneficial for women (unless trying to lose weight, watch sugars).  Drink a lot of water.  Calcium recommendations are 1200-1500 mg daily (1500 mg for postmenopausal women or women without ovaries), and vitamin D 1000 IU daily.  This should be obtained from diet and/or supplements (vitamins), and calcium should not be taken all at once, but in divided doses.  Monthly self breast exams and yearly mammograms for women over the age of 88 is recommended.  Sunscreen of at least SPF 30 should be used on all sun-exposed parts of the skin when outside between the hours of 10 am and 4 pm (not just when at beach or pool, but even with exercise, golf, tennis, and yard work!)  Use a sunscreen that says "broad spectrum" so it covers both UVA and UVB rays, and make sure to reapply every 1-2 hours.  Remember to change the batteries in your smoke detectors when changing your clock times in the spring and fall. Carbon monoxide detectors are recommended for your home.  Use your seat belt every time you are in a car, and please drive safely and not be distracted with cell phones and texting while driving.   Mary [name] , Thank you for taking time to come for your Medicare Wellness Visit. I appreciate your ongoing  commitment to your health goals. Please review the following plan we discussed and let me know if I can assist you in the future.   This is a list of the screening recommended for you and due dates:  Health Maintenance  Topic Date Due   Zoster (Shingles) Vaccine (1 of 2) Never done   COVID-19 Vaccine (6 - 2023-24 season) 02/21/2022   Flu Shot  10/19/2022   Medicare Annual Wellness Visit  07/26/2023   DTaP/Tdap/Td vaccine (4 - Td or Tdap) 04/19/2031   Pneumonia Vaccine  Completed   DEXA scan (bone density measurement)  Completed   HPV Vaccine  Aged Out   I recommend getting the new shingles vaccine (Shingrix). We discussed this, and I understand that you aren't interested.  I recommend getting the RSV vaccine in the Fall, if you have not already had it.  Please check with [facility], and let us know if you got this in the Fall. If you did, pay attention to the news to see whether this is going to be recommended every one or two years (they haven't announced this yet). You can get this the same day as your flu shot, or separate them by 2 weeks.  You can consider getting a COVID booster in July (we recommend waiting at least 3 months from your COVID illness, which was the end of March).  Follow-up as planned, later this month, to see Dr. Pamelia Hoit and get  your Prolia injection.  We discussed that you are no longer interested in getting mammograms or bone density tests.  I do not like that you are needing to take anti-inflammatory medications daily (meloxicam and advil).  These can affect your kidneys, and increase your risk for stomach ulcers. I would prefer that you use a topical anti-inflammatory (Voltaren gel), rather than advil or meloxicam.  If you have to use an oral medication (meloxicam or advil), then I would prefer that you take a medication daily that protects your stomach.  These include either Prilosec, Nexium or Pepcid.  Please try and drink more water, and decaffeinated  beverages.  Being better hydrated will help your bowels be softer.  High fiber diet also helps.  I encourage you to getting a hearing evaluation.  I recommend [facility] or [facility].  You can also go to the audiologist at Dr. Lucky Rathke office.

## 2022-07-25 NOTE — Progress Notes (Unsigned)
No chief complaint on file.  Mary [name] is a 89 y.o. female who presents for annual wellness visit.    She had COVID in March, as well as issues with shortness of breath and lower extremity swelling. At her follow-up visit in April, she had persistent loss of taste which was very bothersome to her. Her breathing had improved.   Needing any albuterol?  Her LE edema had improved with use of compression socks (though patient doesn't like wearing them). She tries to limit the sodium in her diet.  Hip and knee pain. Under the care of ortho.  She underwent injection to the R hip  06/14/2022, and L knee 07/10/22. She was taking advil BID when asked at her last visit. She was advised to stop use of NSAIDs. Can use tylenol up to 3000 mg daily. She is currently taking  Hypertension and palpitations. She continues to do well off digoxin, without recurrence of palpitations/tachycardia. She had a lot of ectopy noted at her last visit, EKG was done to r/o afib. She is taking verapamil 360mg , and is trying to limit the sodium in her diet. She hasn't been checking BP's at home.   No headaches, dizziness, chest pain, no palpitations or edema.   BP Readings from Last 3 Encounters:  06/21/22 122/70  04/10/22 (!) 142/74  03/24/22 115/67     Constipation:  She no longer gets the urge to defecate. She takes almost a full cap of Miralax daily, which works most of the time.  She still takes the stool softener daily.  She is taking laxative once a week, rarely twice, and states that is the only thing that helps. Uses Dulcolax suppository (laxative) almost every night. She denies any blood in the stool or abdominal pain.   Hyperlipidemia follow-up: Patient is reportedly following a low cholesterol diet. Compliant with medications (atorvastatin 40mg ) and denies medication side effects.  Lab Results  Component Value Date   CHOL 191 03/28/2021   HDL 82 03/28/2021   LDLCALC 90 03/28/2021   TRIG 107 03/28/2021    CHOLHDL 2.3 03/28/2021     Urinary retention--she has suprapubic catheter. She drains it about every 2-3 hours.  She goes to the urologist's office once a month to have the catheter changed, and sees Dr. Mena Goes yearly.  She had cystoscopy and treatment for bladder stones in June 2023. Currently she denies dysuria, bladder or flank pain, cloudy or bloody urine, no odor.    Osteoporosis: She has been taking Prolia through Dr. Darrall Dears office since 09/2012. Last treatment was in 01/2022. She is past due for f/u DEXA, discussed with Dr. Pamelia Hoit and declines further testing, but will continue with Prolia treatments. Has appointment with Dr. Pamelia Hoit scheduled for later this month.  She only takes caltrate once daily due to constipation (was worse when taking BID).  She has cottage cheese, yogurt and milk daily.    H/o breast cancer:  Now under the care of Dr. Pamelia Hoit yearly, previously saw Dr. Darnelle Catalan.  She denies any breast concerns or complaints. She no longer wants to have mammograms.   Left knee pain/arthritis--s/p injections in the left knee, under the care of Dr. Lequita Halt. Injections are helpful.   She also has R hip arthritis, with right groin pain. She has been getting some improvement from intra-articular injections.      Immunization History  Administered Date(s) Administered   COVID-19, mRNA, vaccine(Comirnaty)12 years and older 12/27/2021   Fluad Quad(high Dose 65+) 11/27/2018, 12/23/2020   Influenza  Split 12/01/2010, 12/18/2012, 01/02/2015, 01/02/2015   Influenza, High Dose Seasonal PF 12/13/2016, 12/24/2017, 12/06/2019   Influenza-Unspecified 12/08/2013, 12/27/2021   PFIZER(Purple Top)SARS-COV-2 Vaccination 04/24/2019, 05/15/2019, 12/17/2019   Pfizer Covid-19 Vaccine Bivalent Booster 85yrs & up 12/23/2020   Pneumococcal Conjugate-13 04/15/2014   Pneumococcal Polysaccharide-23 12/19/2002, 09/04/2011, 04/04/2021   Td 11/19/2003   Tdap 09/04/2011, 04/18/2021   Last Pap smear:  2013 Last mammogram: 04/2020, declines further Last colonoscopy: 2013, told no further was needed Last DEXA: 04/2019 T-3.2 L radius, -2.9 R fem neck, -1.6 L fem neck; declines further bone density tests Dentist: twice yearly Ophtho: yearly.  Exercise:  minimal. No weight-bearing exercise  Vitamin D screen normal at 43.04 in 01/2021     Patient Care Team: Joselyn Arrow, MD as PCP - General (Family Medicine) Magrinat, Valentino Hue, MD (Inactive) as Consulting Physician (Oncology) Streck, Larina Bras, MD as Consulting Physician (Pediatric Surgery) Jethro Bolus, MD (Inactive) as Consulting Physician (Urology) Ollen Gross, MD as Consulting Physician (Orthopedic Surgery) Dr. Harlin Heys Dr. Eskridge--urology Dr. Darlyn Read (just prn, for cerumen removal) Dr. Karma Ganja Dr. Buccini--GI Dr. Nehemiah Massed Dr. Blima Singer  Depression Screening: Flowsheet Row Office Visit from 07/18/2021 in [facility]  PHQ-2 Total Score 0       Falls screen:     06/14/2022   11:13 AM 07/18/2021    2:57 PM 04/04/2021   10:39 AM 09/22/2020    2:48 PM 04/08/2020   10:08 AM  Fall Risk   Falls in the past year? 1 1 1  0 1  Number falls in past yr: 0 0 0 0 0  Comment 06/11/22-fell out of the armchair fell picking up piece of puzzle-now has a grabber     Injury with Fall? 0 0 0 0 0  Comment   last week-bruised right knee. Reached down to get a puzzle piece and fell  only hit her head, no injury  Risk for fall due to : History of fall(s) History of fall(s) No Fall Risks No Fall Risks   Follow up Falls evaluation completed Falls evaluation completed Falls evaluation completed Falls evaluation completed      Functional Status Survey:         End of Life Discussion:  Patient has a living will and medical power of attorney, scanned in chart    PMH, PSH, SH reviewed and updated     ROS:  The patient denies fever, headaches, chest pain, palpitations, dizziness, syncope,  dyspnea on exertion. Denies URI or allergy symptoms. Vision changes? Decline in hearing (improves some after cerumen removed by ENT), Edema per HPI.  Denies GI or GU complaints, just some chronic constipation, per HPI.   No numbness, tingling, weakness, tremor, abnormal bleeding/bruising, or enlarged lymph nodes. Sees derm. Warty lesion didn't resolve with cryotherapy at last visit.   L knee and R hip pain per HPI. Moods? Weight?    PHYSICAL EXAM:  LMP  (LMP Unknown)   Wt Readings from Last 3 Encounters:  06/21/22 121 lb 9.6 oz (55.2 kg)  06/14/22 124 lb (56.2 kg)  04/10/22 124 lb (56.2 kg)   Pleasant, elderly female, in no distress. HEENT: conjunctiva and sclera are clear, EOMI. OP clear Neck: no lymphadenopathy or mass, no thyromegaly or carotid bruit Heart: No murmur. Frequent extra beats/pauses Lungs: clear bilaterally, no wheezes, rales, ronchi Abdomen: soft, nontender Back: no spinal or CVA tenderness Extremities: 1+ pitting edema, soft, bilateral. No erythema, induration Skin: intact, no rashes. There was a verrucous skin tag on the L forearm.  Neuro: alert and oriented. Cranial nerves grossly intact. Gait not assessed. Grossly normal strength Psych: full range of affect, and in good spirits.  Normal hygiene, speech, eye contact, grooming.    ***UPDATE edema, skin  ASSESSMENT/PLAN:  Ask if taking any advil or aleve, and how much tylenol (for hip/knee pain)  Did she get shingrix or RSV from the pharmacy? Consider COVID booster in July (at least 3 mos from her illness)  Lipids, c-met, cbc Consider TSH, if sx  RF lipitor and verapamil.  F/u with Dr. Pamelia Hoit as scheduled for 07/2022; Prolia due then.   Discussed monthly self breast exams and yearly mammograms (pt has decided she no longer wants mammograms); at least 30 minutes of aerobic activity at least 5 days/week, weight-bearing exercise 2x/week; proper sunscreen use reviewed; healthy diet, including goals of  calcium and vitamin D intake and alcohol recommendations (less than or equal to 1 drink/day) reviewed; regular seatbelt use; changing batteries in smoke detectors.  Immunization recommendations discussed: continue yearly high dose flu shots.  RSV vaccine from the pharmacy in the fall. Shingrix recommended, discussed risks/side effects, to get from pharmacy.  Discussed getting 2nd bivalent COVID booster (and also any updated one in the Fall when they come out); declined today, will consider returning for [place]. Colonoscopy recommendations reviewed, no longer needed unless symptomatic. Patient declines further bone density tests.   MOST form signed, Full Code, Full Care (unchanged, reviewed in detail).  F/u 6 mos for med check   Medicare Attestation I have personally reviewed: The patient's medical and social history Their use of alcohol, tobacco or illicit drugs Their current medications and supplements The patient's functional ability including ADLs,fall risks, home safety risks, cognitive, and hearing and visual impairment Diet and physical activities Evidence for depression or mood disorders  The patient's weight, height, BMI have been recorded in the chart.  I have made referrals, counseling, and provided education to the patient based on review of the above and I have provided the patient with a written personalized care plan for preventive services.

## 2022-07-26 ENCOUNTER — Encounter: Payer: Self-pay | Admitting: Family Medicine

## 2022-07-26 ENCOUNTER — Ambulatory Visit (INDEPENDENT_AMBULATORY_CARE_PROVIDER_SITE_OTHER): Payer: Medicare Other | Admitting: Family Medicine

## 2022-07-26 VITALS — BP 122/80 | HR 72 | Ht 62.0 in | Wt 121.4 lb

## 2022-07-26 DIAGNOSIS — R002 Palpitations: Secondary | ICD-10-CM | POA: Diagnosis not present

## 2022-07-26 DIAGNOSIS — Z0000 Encounter for general adult medical examination without abnormal findings: Secondary | ICD-10-CM

## 2022-07-26 DIAGNOSIS — I10 Essential (primary) hypertension: Secondary | ICD-10-CM

## 2022-07-26 DIAGNOSIS — Z853 Personal history of malignant neoplasm of breast: Secondary | ICD-10-CM | POA: Diagnosis not present

## 2022-07-26 DIAGNOSIS — M810 Age-related osteoporosis without current pathological fracture: Secondary | ICD-10-CM | POA: Diagnosis not present

## 2022-07-26 DIAGNOSIS — Z5181 Encounter for therapeutic drug level monitoring: Secondary | ICD-10-CM | POA: Diagnosis not present

## 2022-07-26 DIAGNOSIS — M159 Polyosteoarthritis, unspecified: Secondary | ICD-10-CM

## 2022-07-26 DIAGNOSIS — K5900 Constipation, unspecified: Secondary | ICD-10-CM | POA: Diagnosis not present

## 2022-07-26 DIAGNOSIS — E782 Mixed hyperlipidemia: Secondary | ICD-10-CM | POA: Diagnosis not present

## 2022-07-26 DIAGNOSIS — Z7189 Other specified counseling: Secondary | ICD-10-CM

## 2022-07-26 DIAGNOSIS — Z9359 Other cystostomy status: Secondary | ICD-10-CM

## 2022-07-26 MED ORDER — ATORVASTATIN CALCIUM 40 MG PO TABS: 40.0000 mg | ORAL_TABLET | Freq: Every day | ORAL | 3 refills | Status: DC

## 2022-07-26 MED ORDER — VERAPAMIL HCL ER 360 MG PO CP24: 360.0000 mg | ORAL_CAPSULE | Freq: Every day | ORAL | 3 refills | Status: DC

## 2022-07-26 NOTE — Progress Notes (Signed)
I looked in [facility] and there was not an RSV vaccine.

## 2022-07-27 LAB — CBC WITH DIFFERENTIAL/PLATELET
Basophils Absolute: 0.1 10*3/uL (ref 0.0–0.2)
Basos: 2 %
EOS (ABSOLUTE): 0.5 10*3/uL — ABNORMAL HIGH (ref 0.0–0.4)
Eos: 8 %
Hematocrit: 38.6 % (ref 34.0–46.6)
Hemoglobin: 12.4 g/dL (ref 11.1–15.9)
Immature Grans (Abs): 0 10*3/uL (ref 0.0–0.1)
Immature Granulocytes: 0 %
Lymphocytes Absolute: 1 10*3/uL (ref 0.7–3.1)
Lymphs: 15 %
MCH: 28.4 pg (ref 26.6–33.0)
MCHC: 32.1 g/dL (ref 31.5–35.7)
MCV: 88 fL (ref 79–97)
Monocytes Absolute: 0.6 10*3/uL (ref 0.1–0.9)
Monocytes: 10 %
Neutrophils Absolute: 4.2 10*3/uL (ref 1.4–7.0)
Neutrophils: 65 %
Platelets: 228 10*3/uL (ref 150–450)
RBC: 4.37 x10E6/uL (ref 3.77–5.28)
RDW: 14.7 % (ref 11.7–15.4)
WBC: 6.4 10*3/uL (ref 3.4–10.8)

## 2022-07-27 LAB — COMPREHENSIVE METABOLIC PANEL
ALT: 13 IU/L (ref 0–32)
AST: 12 IU/L (ref 0–40)
Albumin/Globulin Ratio: 1.9 (ref 1.2–2.2)
Albumin: 4.3 g/dL (ref 3.6–4.6)
Alkaline Phosphatase: 104 IU/L (ref 44–121)
BUN/Creatinine Ratio: 31 — ABNORMAL HIGH (ref 12–28)
BUN: 24 mg/dL (ref 10–36)
Bilirubin Total: 0.6 mg/dL (ref 0.0–1.2)
CO2: 27 mmol/L (ref 20–29)
Calcium: 10.1 mg/dL (ref 8.7–10.3)
Chloride: 100 mmol/L (ref 96–106)
Creatinine, Ser: 0.78 mg/dL (ref 0.57–1.00)
Globulin, Total: 2.3 g/dL (ref 1.5–4.5)
Glucose: 92 mg/dL (ref 70–99)
Potassium: 4.2 mmol/L (ref 3.5–5.2)
Sodium: 140 mmol/L (ref 134–144)
Total Protein: 6.6 g/dL (ref 6.0–8.5)
eGFR: 71 mL/min/{1.73_m2} (ref >59)

## 2022-07-27 LAB — TSH: TSH: 1.36 u[IU]/mL (ref 0.450–4.500)

## 2022-07-27 LAB — LIPID PANEL
Chol/HDL Ratio: 2 ratio (ref 0.0–4.4)
Cholesterol, Total: 188 mg/dL (ref 100–199)
HDL: 95 mg/dL (ref >39)
LDL Chol Calc (NIH): 74 mg/dL (ref 0–99)
Triglycerides: 111 mg/dL (ref 0–149)
VLDL Cholesterol Cal: 19 mg/dL (ref 5–40)

## 2022-08-03 DIAGNOSIS — E871 Hypo-osmolality and hyponatremia: Secondary | ICD-10-CM | POA: Diagnosis not present

## 2022-08-03 DIAGNOSIS — I10 Essential (primary) hypertension: Secondary | ICD-10-CM | POA: Diagnosis not present

## 2022-08-03 DIAGNOSIS — M1611 Unilateral primary osteoarthritis, right hip: Secondary | ICD-10-CM | POA: Diagnosis not present

## 2022-08-03 DIAGNOSIS — R5381 Other malaise: Secondary | ICD-10-CM | POA: Diagnosis not present

## 2022-08-03 DIAGNOSIS — J069 Acute upper respiratory infection, unspecified: Secondary | ICD-10-CM | POA: Diagnosis not present

## 2022-08-03 DIAGNOSIS — E876 Hypokalemia: Secondary | ICD-10-CM | POA: Diagnosis not present

## 2022-08-03 NOTE — Progress Notes (Signed)
Patient Care Team: Joselyn Arrow, MD as PCP - General (Family Medicine) Magrinat, Valentino Hue, MD (Inactive) as Consulting Physician (Oncology) Streck, Larina Bras, MD as Consulting Physician (Pediatric Surgery) Jethro Bolus, MD (Inactive) as Consulting Physician (Urology) Ollen Gross, MD as Consulting Physician (Orthopedic Surgery)  DIAGNOSIS:  Encounter Diagnosis  Name Primary?   Malignant neoplasm of upper-outer quadrant of right breast in female, estrogen receptor positive (HCC) Yes      CHIEF COMPLIANT: Surveillance  INTERVAL HISTORY: Mary [name] is a 89 y.o. with the above mention HER-2 positive breast cancer (s/p right mastectomy). She presents to the clinic today for a follow-up. She reports that she is tolerating the prolia. She denies any pain or discomfort in breast. She complains of arthritis in right leg, the only thing that is bothering her.   ALLERGIES:  is allergic to arimidex [anastrozole], avelox [moxifloxacin hcl in nacl], azithromycin, bactrim, biaxin [clarithromycin], nitrofurantoin, penicillins, sulfa antibiotics, and sulfur.  MEDICATIONS:  Current Outpatient Medications  Medication Sig Dispense Refill   acetaminophen (TYLENOL) 500 MG tablet Take 1,000 mg by mouth 3 (three) times daily.     albuterol (VENTOLIN HFA) 108 (90 Base) MCG/ACT inhaler Inhale 2 puffs into the lungs every 6 (six) hours as needed for wheezing or shortness of breath. 18 g 0   atorvastatin (LIPITOR) 40 MG tablet Take 1 tablet (40 mg total) by mouth daily. 90 tablet 3   bisacodyl (DULCOLAX) 5 MG EC tablet Take 5 mg by mouth daily as needed for moderate constipation.     Calcium Carbonate-Vitamin D (CALCIUM 600+D PO) Take 1 tablet by mouth daily.     Cholecalciferol (VITAMIN D3) 50 MCG (2000 UT) TABS Take 2,000 Units by mouth daily. 100 tablet 4   denosumab (PROLIA) 60 MG/ML SOLN injection Inject 60 mg into the skin every 6 (six) months. Administer in upper arm, thigh, or abdomen      fluticasone (CUTIVATE) 0.005 % ointment Apply 1 Application topically 2 (two) times daily.     hydrocortisone (ANUSOL-HC) 2.5 % rectal cream Place 1 application. rectally 2 (two) times daily. 30 g 1   loratadine (CLARITIN) 10 MG tablet Take 10 mg by mouth daily as needed for allergies.     Multiple Vitamins-Minerals (PRESERVISION AREDS 2) CAPS Take 1 capsule by mouth 2 (two) times daily.     polyethylene glycol (MIRALAX / GLYCOLAX) packet Take 17 g by mouth daily as needed for moderate constipation.     Polyvinyl Alcohol-Povidone (REFRESH OP) Place 1 drop into both eyes 2 (two) times daily.     triamcinolone cream (KENALOG) 0.1 % Apply 1 Application topically 2 (two) times daily. 30 g 0   verapamil (VERELAN) 360 MG 24 hr capsule Take 1 capsule (360 mg total) by mouth at bedtime. 90 capsule 3   No current facility-administered medications for this visit.    PHYSICAL EXAMINATION: ECOG PERFORMANCE STATUS: 1 - Symptomatic but completely ambulatory  Vitals:   08/07/22 1123  BP: (!) 157/81  Pulse: 88  Temp: 97.8 F (36.6 C)  SpO2: 98%   Filed Weights   08/07/22 1123  Weight: 121 lb (54.9 kg)      LABORATORY DATA:  I have reviewed the data as listed    Latest Ref Rng & Units 07/26/2022   10:09 AM 03/22/2022    7:16 AM 03/21/2022    1:53 PM  CMP  Glucose 70 - 99 mg/dL 92  161  096   BUN 10 - 36 mg/dL 24  13  13   Creatinine 0.57 - 1.00 mg/dL 4.09  8.11  9.14   Sodium 134 - 144 mmol/L 140  135  133   Potassium 3.5 - 5.2 mmol/L 4.2  3.4  3.6   Chloride 96 - 106 mmol/L 100  98  95   CO2 20 - 29 mmol/L 27  28  26    Calcium 8.7 - 10.3 mg/dL 78.2  7.9  8.5   Total Protein 6.0 - 8.5 g/dL 6.6  6.2    Total Bilirubin 0.0 - 1.2 mg/dL 0.6  0.9    Alkaline Phos 44 - 121 IU/L 104  88    AST 0 - 40 IU/L 12  22    ALT 0 - 32 IU/L 13  19      Lab Results  Component Value Date   WBC 6.7 08/07/2022   HGB 12.3 08/07/2022   HCT 37.5 08/07/2022   MCV 88.4 08/07/2022   PLT 250 08/07/2022    NEUTROABS 4.6 08/07/2022    ASSESSMENT & PLAN:  Malignant neoplasm of upper-outer quadrant of right breast in female, estrogen receptor positive (HCC) 01/23/2006: Stage II AT2N0 2.5 cm, grade 2 IDC ER 100%, PR 3%, Ki-67 18%, HER2 positive 3+, 0/1 lymph node 02/22/2006: Oncotype DX score 15: 10% ROR 02/28/2006: Right mastectomy residual IDC stage II AT2N0 January 2008: Anastrozole started.  Discontinued April 2008 (hives) switched to letrozole in April 2008 completed December 2012 Osteoporosis: T score -3.2: On Prolia every 6 months Genetics negative (Ashkenazi Jewish descent)   Breast cancer surveillance: 1.  Breast exam: Patient did not want [place] to do an exam. 2. left mammogram February 2022: Benign She does not want to do any further mammograms.   Osteoporosis: T score -3.2 in 2021.  She will receive Prolia every 6 months with calcium and vitamin D  Return to clinic in 1 year for follow-up with [name].  She will come in 6 months for lab and Prolia injection.    No orders of the defined types were placed in this encounter.  The patient has a good understanding of the overall plan. she agrees with it. she will call with any problems that may develop before the next visit here. Total time spent: 30 mins including face to face time and time spent for planning, charting and co-ordination of care   Tamsen Meek, MD 08/07/22    I Janan Ridge am acting as a Neurosurgeon for [employer]  I have reviewed the above documentation for accuracy and completeness, and I agree with the above.

## 2022-08-07 ENCOUNTER — Other Ambulatory Visit: Payer: Self-pay

## 2022-08-07 ENCOUNTER — Inpatient Hospital Stay: Payer: Medicare Other | Attending: Hematology and Oncology | Admitting: Hematology and Oncology

## 2022-08-07 ENCOUNTER — Inpatient Hospital Stay: Payer: Medicare Other

## 2022-08-07 ENCOUNTER — Other Ambulatory Visit: Payer: Self-pay | Admitting: *Deleted

## 2022-08-07 VITALS — BP 157/81 | HR 88 | Temp 97.8°F | Wt 121.0 lb

## 2022-08-07 DIAGNOSIS — Z08 Encounter for follow-up examination after completed treatment for malignant neoplasm: Secondary | ICD-10-CM | POA: Insufficient documentation

## 2022-08-07 DIAGNOSIS — C50411 Malignant neoplasm of upper-outer quadrant of right female breast: Secondary | ICD-10-CM | POA: Diagnosis not present

## 2022-08-07 DIAGNOSIS — M810 Age-related osteoporosis without current pathological fracture: Secondary | ICD-10-CM | POA: Insufficient documentation

## 2022-08-07 DIAGNOSIS — Z170 Estrogen receptor positive status [ER+]: Secondary | ICD-10-CM

## 2022-08-07 DIAGNOSIS — M818 Other osteoporosis without current pathological fracture: Secondary | ICD-10-CM

## 2022-08-07 DIAGNOSIS — Z853 Personal history of malignant neoplasm of breast: Secondary | ICD-10-CM | POA: Insufficient documentation

## 2022-08-07 LAB — CBC WITH DIFFERENTIAL (CANCER CENTER ONLY)
Abs Immature Granulocytes: 0.03 10*3/uL (ref 0.00–0.07)
Basophils Absolute: 0.1 10*3/uL (ref 0.0–0.1)
Basophils Relative: 1 %
Eosinophils Absolute: 0.5 10*3/uL (ref 0.0–0.5)
Eosinophils Relative: 7 %
HCT: 37.5 % (ref 36.0–46.0)
Hemoglobin: 12.3 g/dL (ref 12.0–15.0)
Immature Granulocytes: 0 %
Lymphocytes Relative: 12 %
Lymphs Abs: 0.8 10*3/uL (ref 0.7–4.0)
MCH: 29 pg (ref 26.0–34.0)
MCHC: 32.8 g/dL (ref 30.0–36.0)
MCV: 88.4 fL (ref 80.0–100.0)
Monocytes Absolute: 0.7 10*3/uL (ref 0.1–1.0)
Monocytes Relative: 10 %
Neutro Abs: 4.6 10*3/uL (ref 1.7–7.7)
Neutrophils Relative %: 70 %
Platelet Count: 250 10*3/uL (ref 150–400)
RBC: 4.24 MIL/uL (ref 3.87–5.11)
RDW: 16.6 % — ABNORMAL HIGH (ref 11.5–15.5)
WBC Count: 6.7 10*3/uL (ref 4.0–10.5)
nRBC: 0 % (ref 0.0–0.2)

## 2022-08-07 LAB — CMP (CANCER CENTER ONLY)
ALT: 11 U/L (ref 0–44)
AST: 14 U/L — ABNORMAL LOW (ref 15–41)
Albumin: 4 g/dL (ref 3.5–5.0)
Alkaline Phosphatase: 90 U/L (ref 38–126)
Anion gap: 6 (ref 5–15)
BUN: 18 mg/dL (ref 8–23)
CO2: 33 mmol/L — ABNORMAL HIGH (ref 22–32)
Calcium: 9.8 mg/dL (ref 8.9–10.3)
Chloride: 101 mmol/L (ref 98–111)
Creatinine: 0.78 mg/dL (ref 0.44–1.00)
GFR, Estimated: >60 mL/min (ref >60)
Glucose, Bld: 96 mg/dL (ref 70–99)
Potassium: 3.9 mmol/L (ref 3.5–5.1)
Sodium: 140 mmol/L (ref 135–145)
Total Bilirubin: 0.6 mg/dL (ref 0.3–1.2)
Total Protein: 6.8 g/dL (ref 6.5–8.1)

## 2022-08-07 MED ORDER — DENOSUMAB 60 MG/ML ~~LOC~~ SOSY
60.0000 mg | PREFILLED_SYRINGE | Freq: Once | SUBCUTANEOUS | Status: AC
  Administered 2022-08-07: 60 mg via SUBCUTANEOUS
  Filled 2022-08-07: qty 1

## 2022-08-07 NOTE — Assessment & Plan Note (Addendum)
01/23/2006: Stage II AT2N0 2.5 cm, grade 2 IDC ER 100%, PR 3%, Ki-67 18%, HER2 positive 3+, 0/1 lymph node 02/22/2006: Oncotype DX score 15: 10% ROR 02/28/2006: Right mastectomy residual IDC stage II AT2N0 January 2008: Anastrozole started.  Discontinued April 2008 (hives) switched to letrozole in April 2008 completed December 2012 Osteoporosis: T score -3.2: On Prolia every 6 months Genetics negative (Ashkenazi Jewish descent)   Breast cancer surveillance: 1.  Breast exam: Patient did not want [place] to do an exam. 2. left mammogram February 2022: Benign She does not want to do any further mammograms.   Osteoporosis: T score -3.2 in 2021.  She will receive Prolia every 6 months with calcium and vitamin D  Return to clinic in 1 year for follow-up with [name].  She will come in 6 months for lab and Prolia injection.

## 2022-08-23 DIAGNOSIS — N312 Flaccid neuropathic bladder, not elsewhere classified: Secondary | ICD-10-CM | POA: Diagnosis not present

## 2022-08-23 DIAGNOSIS — R338 Other retention of urine: Secondary | ICD-10-CM | POA: Diagnosis not present

## 2022-08-23 DIAGNOSIS — M25551 Pain in right hip: Secondary | ICD-10-CM | POA: Diagnosis not present

## 2022-08-24 ENCOUNTER — Other Ambulatory Visit: Payer: Self-pay | Admitting: Family Medicine

## 2022-08-24 NOTE — Telephone Encounter (Signed)
Patient did call and request this.

## 2022-08-30 ENCOUNTER — Other Ambulatory Visit: Payer: Self-pay | Admitting: Family Medicine

## 2022-08-30 DIAGNOSIS — R002 Palpitations: Secondary | ICD-10-CM

## 2022-08-30 DIAGNOSIS — I10 Essential (primary) hypertension: Secondary | ICD-10-CM

## 2022-09-06 DIAGNOSIS — M25551 Pain in right hip: Secondary | ICD-10-CM | POA: Diagnosis not present

## 2022-09-13 DIAGNOSIS — L820 Inflamed seborrheic keratosis: Secondary | ICD-10-CM | POA: Diagnosis not present

## 2022-09-13 DIAGNOSIS — B078 Other viral warts: Secondary | ICD-10-CM | POA: Diagnosis not present

## 2022-09-13 DIAGNOSIS — D485 Neoplasm of uncertain behavior of skin: Secondary | ICD-10-CM | POA: Diagnosis not present

## 2022-09-18 ENCOUNTER — Other Ambulatory Visit: Payer: Self-pay | Admitting: Family Medicine

## 2022-09-18 DIAGNOSIS — E782 Mixed hyperlipidemia: Secondary | ICD-10-CM

## 2022-09-18 DIAGNOSIS — R339 Retention of urine, unspecified: Secondary | ICD-10-CM | POA: Diagnosis not present

## 2022-09-27 ENCOUNTER — Telehealth: Payer: Self-pay | Admitting: Family Medicine

## 2022-09-27 MED ORDER — DIAZEPAM 2 MG PO TABS: ORAL_TABLET | ORAL | 0 refills | Status: DC

## 2022-09-27 NOTE — Telephone Encounter (Signed)
Patient has been specific about wanting the brand in the past.  This isn't something I want to do without letting her know (and she can decide if she is okay with getting the generic, vs checking a different pharmacy)

## 2022-09-27 NOTE — Telephone Encounter (Signed)
Pharmacy sent message stating that valium was not available to order please submit new rx that reads substitution permitted so we can dispense generic of valium 2mg   Please send to the [id number] - [place], [place] - [address] AT [address] & [facility] [address]

## 2022-10-16 DIAGNOSIS — N312 Flaccid neuropathic bladder, not elsewhere classified: Secondary | ICD-10-CM | POA: Diagnosis not present

## 2022-10-26 ENCOUNTER — Emergency Department (HOSPITAL_BASED_OUTPATIENT_CLINIC_OR_DEPARTMENT_OTHER): Payer: Medicare Other

## 2022-10-26 ENCOUNTER — Encounter (HOSPITAL_BASED_OUTPATIENT_CLINIC_OR_DEPARTMENT_OTHER): Payer: Self-pay

## 2022-10-26 ENCOUNTER — Emergency Department (HOSPITAL_BASED_OUTPATIENT_CLINIC_OR_DEPARTMENT_OTHER)
Admission: EM | Admit: 2022-10-26 | Discharge: 2022-10-26 | Disposition: A | Discharge status: Home / Self Care | Payer: Medicare Other | Attending: Emergency Medicine | Admitting: Emergency Medicine

## 2022-10-26 ENCOUNTER — Other Ambulatory Visit: Payer: Self-pay

## 2022-10-26 DIAGNOSIS — T83010A Breakdown (mechanical) of cystostomy catheter, initial encounter: Secondary | ICD-10-CM

## 2022-10-26 DIAGNOSIS — T83091A Other mechanical complication of indwelling urethral catheter, initial encounter: Secondary | ICD-10-CM | POA: Diagnosis not present

## 2022-10-26 DIAGNOSIS — Z87442 Personal history of urinary calculi: Secondary | ICD-10-CM | POA: Insufficient documentation

## 2022-10-26 DIAGNOSIS — R0602 Shortness of breath: Secondary | ICD-10-CM | POA: Insufficient documentation

## 2022-10-26 DIAGNOSIS — I10 Essential (primary) hypertension: Secondary | ICD-10-CM | POA: Insufficient documentation

## 2022-10-26 DIAGNOSIS — J449 Chronic obstructive pulmonary disease, unspecified: Secondary | ICD-10-CM | POA: Diagnosis not present

## 2022-10-26 DIAGNOSIS — J9811 Atelectasis: Secondary | ICD-10-CM | POA: Diagnosis not present

## 2022-10-26 DIAGNOSIS — J90 Pleural effusion, not elsewhere classified: Secondary | ICD-10-CM | POA: Diagnosis not present

## 2022-10-26 DIAGNOSIS — Z853 Personal history of malignant neoplasm of breast: Secondary | ICD-10-CM | POA: Insufficient documentation

## 2022-10-26 DIAGNOSIS — R7989 Other specified abnormal findings of blood chemistry: Secondary | ICD-10-CM | POA: Diagnosis not present

## 2022-10-26 DIAGNOSIS — R778 Other specified abnormalities of plasma proteins: Secondary | ICD-10-CM | POA: Diagnosis not present

## 2022-10-26 LAB — COMPREHENSIVE METABOLIC PANEL
ALT: 24 U/L (ref 0–44)
AST: 20 U/L (ref 15–41)
Albumin: 3.9 g/dL (ref 3.5–5.0)
Alkaline Phosphatase: 87 U/L (ref 38–126)
Anion gap: 7 (ref 5–15)
BUN: 19 mg/dL (ref 8–23)
CO2: 31 mmol/L (ref 22–32)
Calcium: 9.4 mg/dL (ref 8.9–10.3)
Chloride: 99 mmol/L (ref 98–111)
Creatinine, Ser: 0.63 mg/dL (ref 0.44–1.00)
GFR, Estimated: >60 mL/min (ref >60)
Glucose, Bld: 107 mg/dL — ABNORMAL HIGH (ref 70–99)
Potassium: 3.9 mmol/L (ref 3.5–5.1)
Sodium: 137 mmol/L (ref 135–145)
Total Bilirubin: 0.5 mg/dL (ref 0.3–1.2)
Total Protein: 6.6 g/dL (ref 6.5–8.1)

## 2022-10-26 LAB — CBC WITH DIFFERENTIAL/PLATELET
Abs Immature Granulocytes: 0.01 10*3/uL (ref 0.00–0.07)
Basophils Absolute: 0.1 10*3/uL (ref 0.0–0.1)
Basophils Relative: 1 %
Eosinophils Absolute: 0.3 10*3/uL (ref 0.0–0.5)
Eosinophils Relative: 5 %
HCT: 39.2 % (ref 36.0–46.0)
Hemoglobin: 12.7 g/dL (ref 12.0–15.0)
Immature Granulocytes: 0 %
Lymphocytes Relative: 13 %
Lymphs Abs: 0.8 10*3/uL (ref 0.7–4.0)
MCH: 28.7 pg (ref 26.0–34.0)
MCHC: 32.4 g/dL (ref 30.0–36.0)
MCV: 88.7 fL (ref 80.0–100.0)
Monocytes Absolute: 0.6 10*3/uL (ref 0.1–1.0)
Monocytes Relative: 10 %
Neutro Abs: 4.4 10*3/uL (ref 1.7–7.7)
Neutrophils Relative %: 71 %
Platelets: 235 10*3/uL (ref 150–400)
RBC: 4.42 MIL/uL (ref 3.87–5.11)
RDW: 15.7 % — ABNORMAL HIGH (ref 11.5–15.5)
WBC: 6.2 10*3/uL (ref 4.0–10.5)
nRBC: 0 % (ref 0.0–0.2)

## 2022-10-26 LAB — TROPONIN I (HIGH SENSITIVITY): Troponin I (High Sensitivity): 37 ng/L — ABNORMAL HIGH (ref <18)

## 2022-10-26 LAB — BRAIN NATRIURETIC PEPTIDE: B Natriuretic Peptide: 539.2 pg/mL — ABNORMAL HIGH (ref 0.0–100.0)

## 2022-10-26 MED ORDER — ALBUTEROL SULFATE HFA 108 (90 BASE) MCG/ACT IN AERS: 2.0000 | INHALATION_SPRAY | RESPIRATORY_TRACT | Status: DC | PRN

## 2022-10-26 NOTE — ED Triage Notes (Signed)
Patient arrives with complaints of increased abdominal discomfort related to suprapubic catheter. Patient states that is having output, but not as much as usual Concerned that it may need to be changed.  Also having increased shortness of breath.

## 2022-10-26 NOTE — ED Notes (Signed)
Discharged by another RN.

## 2022-10-26 NOTE — ED Provider Notes (Signed)
[place] EMERGENCY DEPARTMENT AT [facility] Provider Note  CSN: [id number] Arrival date & time: 10/26/22 1733  Chief Complaint(s) Shortness of Breath and Suprapubic Catheter  HPI Mary [name] is a 89 y.o. female with past medical history as below, significant for suprapubic catheter, BCC, breast caner, HTN, HLD who presents to the ED with complaint of suprapubic catheter dysfunction. In triage also noted some dyspnea but has been ongoing for months, unchanged. Pt here with reduced output from her catheter. Tolerating PO w/o abnormality, no fevers or abd pain. No change to bowel function. No cp. She has some exertional dyspnea unchanged from baseline. No leg swelling. No fevers/chills. Has appt with urology tomorrow Dr Mena Goes   Past Medical History Past Medical History:  Diagnosis Date   Alopecia    Ashkenazi Jewish ancestry    Cancer [facility])    External hemorrhoid    Foley catheter in place    Heart palpitations no cardiologist--  monitored by pcp   per pt "has been on verapamil and lanoxin for years and no palpitations for a very long time"   History of acute bronchitis    dx 12-21-2014  finished ZPAK   History of adenomatous polyp of colon    2003   History of basal cell carcinoma excision    2006  left nasolabial fold   History of benign colon tumor    2007  --  HIGH GRADE HYPERPLASTIC ,  S/P  LEFT HEMICOLECTOMY   History of breast cancer ONCOLOGIST-  DR Darnelle Catalan--  antiestogen therapy with femera completed 12/  2012--  no recurrence   dx 10/  2007  right breast Invasive DCIS, grade 2, Stage 2A, pT2  pN0 pMX,  (ER 100% /PR 3% +),  HER +3) with 1 metastatic axill node---  s/p  right mastectomy    History of kidney stones    Hypertension    Hypotonic bladder UROLOGIST-  DR Patsi Sears   Mixed hyperlipidemia    OA (osteoarthritis)    hip   Osteoporosis    DEXA 01/2009; T-3.2 L fem neck, -2.4 L radius   Urinary retention    02-13-2015   Patient Active Problem List    Diagnosis Date Noted   Acute UTI (urinary tract infection) 03/21/2022   URI (upper respiratory infection) 03/21/2022   Hyponatremia 03/21/2022   Hypocalcemia 03/21/2022   Asymptomatic bacteriuria 03/21/2022   Sinus arrhythmia 03/21/2022   Bladder stones 08/17/2021   Presbycusis of both ears 04/12/2021   Postnasal drip 03/01/2017   Abnormal auditory perception of left ear 12/09/2015   Bilateral impacted cerumen 11/26/2015   Ashkenazi Jewish ancestry    OA (osteoarthritis) of knee 01/24/2013   Osteoporosis 06/01/2011   Malignant neoplasm of upper-outer quadrant of right breast in female, estrogen receptor positive (HCC) 04/03/2011   Essential hypertension, benign 12/01/2010   Mixed hyperlipidemia 10/19/2010   Palpitations 10/19/2010   Atony of bladder 10/19/2010   Home Medication(s) Prior to Admission medications   Medication Sig Start Date End Date Taking? Authorizing Provider  acetaminophen (TYLENOL) 500 MG tablet Take 1,000 mg by mouth 3 (three) times daily.    [provider]  albuterol (VENTOLIN HFA) 108 (90 Base) MCG/ACT inhaler Inhale 2 puffs into the lungs every 6 (six) hours as needed for wheezing or shortness of breath. 06/21/22   Joselyn Arrow, MD  atorvastatin (LIPITOR) 40 MG tablet TAKE 1 TABLET(40 MG) BY MOUTH DAILY 09/18/22   Joselyn Arrow, MD  bisacodyl (DULCOLAX) 5 MG EC tablet Take  5 mg by mouth daily as needed for moderate constipation.    [provider]  Calcium Carbonate-Vitamin D (CALCIUM 600+D PO) Take 1 tablet by mouth daily.    [provider]  Cholecalciferol (VITAMIN D3) 50 MCG (2000 UT) TABS Take 2,000 Units by mouth daily. 08/03/20   Magrinat, Valentino Hue, MD  denosumab (PROLIA) 60 MG/ML SOLN injection Inject 60 mg into the skin every 6 (six) months. Administer in upper arm, thigh, or abdomen 10/24/12   [provider]  diazepam (VALIUM) 2 MG tablet TAKE 1 TABLET(2 MG) BY MOUTH EVERY 8 HOURS AS NEEDED FOR MUSCLE SPASMS 09/27/22   Joselyn Arrow, MD  fluticasone (CUTIVATE) 0.005 % ointment Apply 1 Application topically 2 (two) times daily.    [provider]  hydrocortisone (ANUSOL-HC) 2.5 % rectal cream Place 1 application. rectally 2 (two) times daily. 07/18/21   Joselyn Arrow, MD  loratadine (CLARITIN) 10 MG tablet Take 10 mg by mouth daily as needed for allergies.    [provider]  Multiple Vitamins-Minerals (PRESERVISION AREDS 2) CAPS Take 1 capsule by mouth 2 (two) times daily.    [provider]  polyethylene glycol (MIRALAX / GLYCOLAX) packet Take 17 g by mouth daily as needed for moderate constipation.    [provider]  Polyvinyl Alcohol-Povidone (REFRESH OP) Place 1 drop into both eyes 2 (two) times daily.    [provider]  triamcinolone cream (KENALOG) 0.1 % Apply 1 Application topically 2 (two) times daily. 05/10/22   Joselyn Arrow, MD  verapamil (VERELAN) 360 MG 24 hr capsule TAKE 1 CAPSULE(360 MG) BY MOUTH AT BEDTIME 08/30/22   Joselyn Arrow, MD                                                                                                                                    Past Surgical History Past Surgical History:  Procedure Laterality Date   APPENDECTOMY  age 62   BREAST LUMPECTOMY WITH NEEDLE LOCALIZATION AND AXILLARY SENTINEL LYMPH NODE BX Right 01-23-2006   CARPAL TUNNEL RELEASE Right 04-24-2007   and Pulley Release index and small finger   CATARACT EXTRACTION W/ INTRAOCULAR LENS  IMPLANT, BILATERAL  left 1991  &  right 1993   COLONOSCOPY  last one 2013   CYSTOSCOPY N/A 07/12/2015   Procedure: CYSTOSCOPY;  Surgeon: Jethro Bolus, MD;  Location: [facility];  Service: Urology;  Laterality: N/A;   CYSTOSCOPY WITH BIOPSY N/A 03/01/2015   Procedure: CYSTOSCOPY WITH TAUBER BIOPSY OF 1CM RIGHT LATERAL BLADDER WALL AND CAUTERIZATION OF BIOPSY SITE ;  Surgeon: Jethro Bolus, MD;  Location: [facility];  Service: Urology;  Laterality: N/A;    CYSTOSCOPY WITH LITHOLAPAXY N/A 09/06/2021   Procedure: CYSTOSCOPY WITH LITHOLAPAXY < 2cm, HOLMIUM LASER LITHOTRIPSY, BILATERAL RETROGRADE PYELOGRAM, SUPRAPUBIC CATHETER EXCHANGE;  Surgeon: Jerilee Field, MD;  Location: [facility];  Service: Urology;  Laterality: N/A;  ONLY  NEEDS 90 MIN   INSERTION OF SUPRAPUBIC CATHETER N/A 07/12/2015   Procedure: INSERTION OF SUPRAPUBIC CATHETER;  Surgeon: Jethro Bolus, MD;  Location: [facility];  Service: Urology;  Laterality: N/A;   LAPAROSCOPIC ASSISTED LEFT HEMICOLECTOMY  06/  2007   high grade hyperplastic mass   MASTECTOMY Right 02-28-2006   PARTIAL KNEE ARTHROPLASTY Right 01/24/2013   Procedure: RIGHT KNEE MEDIAL UNICOMPARTMENTAL ARTHROPLASTY;  Surgeon: Loanne Drilling, MD;  Location: [facility];  Service: Orthopedics;  Laterality: Right;   PULLEY RELEASE LEFT INDEX AND SMALL FINGER  08-07-2007   TONSILLECTOMY  age 38   Family History Family History  Problem Relation Age of Onset   Hypertension Mother    Stroke Father    Diabetes Father    Hyperlipidemia Brother    Atrial fibrillation Brother    Hyperlipidemia Son    Breast cancer Paternal Aunt        dx 63s; deceased early 32s   Breast cancer Cousin        paternal first cousin   Colon cancer Cousin        paternal first cousin; age dx 75s    Social History Social History   Tobacco Use   Smoking status: Never   Smokeless tobacco: Never  Vaping Use   Vaping status: Never Used  Substance Use Topics   Alcohol use: Never   Drug use: No   Allergies Arimidex [anastrozole], Avelox [moxifloxacin hcl in nacl], Azithromycin, Bactrim, Biaxin [clarithromycin], Nitrofurantoin, Penicillins, Sulfa antibiotics, and Sulfur  Review of Systems Review of Systems  Constitutional:  Negative for chills and fever.  HENT:  Negative for congestion.   Respiratory:  Positive for shortness of breath. Negative for chest tightness.   Cardiovascular:  Negative for chest pain and palpitations.   Gastrointestinal:  Negative for abdominal pain, blood in stool, nausea and vomiting.  Genitourinary:  Positive for decreased urine volume. Negative for flank pain, hematuria and vaginal bleeding.  Musculoskeletal:  Negative for back pain.  Skin:  Negative for rash and wound.  All other systems reviewed and are negative.   Physical Exam Vital Signs  I have reviewed the triage vital signs BP (!) 155/107   Pulse (!) 103   Temp 97.9 F (36.6 C) (Temporal)   Resp (!) 22   Ht 5\' 2"  (1.575 m)   Wt 54.9 kg   LMP  (LMP Unknown)   SpO2 96%   BMI 22.14 kg/m  Physical Exam Vitals and nursing note reviewed.  Constitutional:      General: She is not in acute distress.    Appearance: Normal appearance. She is well-developed. She is not ill-appearing.  HENT:     Head: Normocephalic and atraumatic.     Right Ear: External ear normal.     Left Ear: External ear normal.     Nose: Nose normal.     Mouth/Throat:     Mouth: Mucous membranes are moist.  Eyes:     General: No scleral icterus.       Right eye: No discharge.        Left eye: No discharge.  Cardiovascular:     Rate and Rhythm: Normal rate. Rhythm irregular.     Comments: afib Pulmonary:     Effort: Pulmonary effort is normal. No respiratory distress.     Breath sounds: No stridor.  Abdominal:     General: Abdomen is flat. There is no distension.     Palpations: Abdomen is soft.  Tenderness: There is no abdominal tenderness. There is no guarding.       Comments: No abd ttp, specifically no tenderness around catheter site   Musculoskeletal:        General: No deformity.     Cervical back: No rigidity.  Skin:    General: Skin is warm and dry.     Coloration: Skin is not cyanotic, jaundiced or pale.  Neurological:     Mental Status: She is alert and oriented to person, place, and time.     GCS: GCS eye subscore is 4. GCS verbal subscore is 5. GCS motor subscore is 6.  Psychiatric:        Speech: Speech normal.         Behavior: Behavior normal. Behavior is cooperative.     ED Results and Treatments Labs (all labs ordered are listed, but only abnormal results are displayed) Labs Reviewed  CBC WITH DIFFERENTIAL/PLATELET - Abnormal; Notable for the following components:      Result Value   RDW 15.7 (*)    All other components within normal limits  COMPREHENSIVE METABOLIC PANEL - Abnormal; Notable for the following components:   Glucose, Bld 107 (*)    All other components within normal limits  TROPONIN I (HIGH SENSITIVITY) - Abnormal; Notable for the following components:   Troponin I (High Sensitivity) 37 (*)    All other components within normal limits  BRAIN NATRIURETIC PEPTIDE  TROPONIN I (HIGH SENSITIVITY)                                                                                                                          Radiology DG Chest Port 1 View  Result Date: 10/26/2022 CLINICAL DATA:  Shortness of breath EXAM: PORTABLE CHEST 1 VIEW COMPARISON:  03/21/2022 FINDINGS: There is hyperinflation of the lungs compatible with COPD. Heart and mediastinal contours within normal limits. Small right pleural effusion with right base atelectasis. Left lung clear. No acute bony abnormality. IMPRESSION: COPD. Small right pleural effusion with right base atelectasis. Electronically Signed   By: Charlett Nose M.D.   On: 10/26/2022 19:13    Pertinent labs & imaging results that were available during my care of the patient were reviewed by me and considered in my medical decision making (see MDM for details).  Medications Ordered in ED Medications  albuterol (VENTOLIN HFA) 108 (90 Base) MCG/ACT inhaler 2 puff (has no administration in time range)  Procedures Procedures  (including critical care time)  Medical Decision Making / ED Course    Medical Decision Making:     Shambhavi Sherron is a 89 y.o. female with past medical history as below, significant for suprapubic catheter, BCC, breast caner, HTN, HLD who presents to the ED with complaint of suprapubic catheter dysfunction.. The complaint involves an extensive differential diagnosis and also carries with it a high risk of complications and morbidity.  Serious etiology was considered. Ddx includes but is not limited to: catheter dysfunction, blockage, malfunction, cardiopulmonary etiology also considered given chronic dyspnea, etc  Complete initial physical exam performed, notably the patient  was NAD, no pain, sitting upright, requesting discharge.    Reviewed and confirmed nursing documentation for past medical history, family history, social history.  Vital signs reviewed.    Clinical Course as of 10/26/22 1954  Thu Oct 26, 2022  4098 Troponin I (High Sensitivity)(!): 37 No chest pain, she has exertional dyspnea for years unchanged  [SG]  1925 Pt requesting discharge, she does not want any further workup at this point. Will follow up with urology tomorrow and PCP within next few days.  [SG]    Clinical Course User Index [SG] Sloan Leiter, DO    Narrative: 89 yo female w/ hx as above here 2/2 catheter malfunction/reduced output She has no abd pain or fever, tolerating PO, no change to color of typical UOP No change to stool function, no brbpr or melena Catheter was flushed and is draining appropriately, no hematuria Pt offered exchange but she declines, reports she is ready to go Her trop is mildly elevated, she has no CP, CXR w/ small pleural effusion. EKG is w/o ischemia She has no chest pain Discussed pt my desire to get delta troponin to which she adamantly refuses and is ready for discharge No prior trop available in chart review, no chest pain.  With single troponin her HEART score is a 4, no chest pain Recommend she follow up with PCP and cardiologist  (referral placed) Again does not want  to stay for any further workup or for me to exchange her catheter, would rather have her urologist do it tomorrow Does not appear to be retaining urine, no suprapubic pain  The patient improved significantly and was discharged in stable condition. Detailed discussions were had with the patient regarding current findings, and need for close f/u with PCP or on call doctor. The patient has been instructed to return immediately if the symptoms worsen in any way for re-evaluation. Patient verbalized understanding and is in agreement with current care plan. All questions answered prior to discharge.                  Additional history obtained: -Additional history obtained from family -External records from outside source obtained and reviewed including: Chart review including previous notes, labs, imaging, consultation notes including primary care documentation, prior labs    Lab Tests: -I ordered, reviewed, and interpreted labs.   The pertinent results include:   Labs Reviewed  CBC WITH DIFFERENTIAL/PLATELET - Abnormal; Notable for the following components:      Result Value   RDW 15.7 (*)    All other components within normal limits  COMPREHENSIVE METABOLIC PANEL - Abnormal; Notable for the following components:   Glucose, Bld 107 (*)    All other components within normal limits  TROPONIN I (HIGH SENSITIVITY) - Abnormal; Notable for the following components:   Troponin I (High Sensitivity) 37 (*)  All other components within normal limits  BRAIN NATRIURETIC PEPTIDE  TROPONIN I (HIGH SENSITIVITY)    Notable for as above  EKG   EKG Interpretation Date/Time:  Thursday October 26 2022 17:52:49 EDT Ventricular Rate:  97 PR Interval:    QRS Duration:  122 QT Interval:  350 QTC Calculation: 444 R Axis:   221  Text Interpretation: Right bundle branch block Abnormal ECG When compared with ECG of 21-Mar-2022 14:18, PREVIOUS ECG IS PRESENT Irregular similar to prior  1/24  Confirmed by Tanda Rockers ([id number]) on 10/26/2022 6:00:59 PM         Imaging Studies ordered: I ordered imaging studies including CXR I independently visualized the following imaging with scope of interpretation limited to determining acute life threatening conditions related to emergency care; findings noted above, significant for small effusion  I independently visualized and interpreted imaging. I agree with the radiologist interpretation   Medicines ordered and prescription drug management: Meds ordered this encounter  Medications   albuterol (VENTOLIN HFA) 108 (90 Base) MCG/ACT inhaler 2 puff    -I have reviewed the patients home medicines and have made adjustments as needed   Consultations Obtained: na   Cardiac Monitoring: The patient was maintained on a cardiac monitor.  I personally viewed and interpreted the cardiac monitored which showed an underlying rhythm of: irregular rhythm  Social Determinants of Health:  Diagnosis or treatment significantly limited by social determinants of health: non smoker   Reevaluation: After the interventions noted above, I reevaluated the patient and found that they have improved  Co morbidities that complicate the patient evaluation  Past Medical History:  Diagnosis Date   Alopecia    Ashkenazi Jewish ancestry    Cancer [facility])    External hemorrhoid    Foley catheter in place    Heart palpitations no cardiologist--  monitored by pcp   per pt "has been on verapamil and lanoxin for years and no palpitations for a very long time"   History of acute bronchitis    dx 12-21-2014  finished ZPAK   History of adenomatous polyp of colon    2003   History of basal cell carcinoma excision    2006  left nasolabial fold   History of benign colon tumor    2007  --  HIGH GRADE HYPERPLASTIC ,  S/P  LEFT HEMICOLECTOMY   History of breast cancer ONCOLOGIST-  DR Darnelle Catalan--  antiestogen therapy with femera completed 12/  2012--  no recurrence    dx 10/  2007  right breast Invasive DCIS, grade 2, Stage 2A, pT2  pN0 pMX,  (ER 100% /PR 3% +),  HER +3) with 1 metastatic axill node---  s/p  right mastectomy    History of kidney stones    Hypertension    Hypotonic bladder UROLOGIST-  DR Patsi Sears   Mixed hyperlipidemia    OA (osteoarthritis)    hip   Osteoporosis    DEXA 01/2009; T-3.2 L fem neck, -2.4 L radius   Urinary retention    02-13-2015      Dispostion: Disposition decision including need for hospitalization was considered, and patient discharged from emergency department.    Final Clinical Impression(s) / ED Diagnoses Final diagnoses:  Suprapubic catheter dysfunction, initial encounter (HCC)  Elevated troponin        Sloan Leiter, DO 10/26/22 1955

## 2022-10-26 NOTE — Discharge Instructions (Signed)
It was a pleasure caring for you today in the emergency department.  Please see your urologist tomorrow in regards to catheter   Please return to the emergency department for any fever, chills, abdominal pain, vomiting, reduced output from catheter or any worsening or worrisome symptoms.

## 2022-10-26 NOTE — ED Notes (Signed)
All appropriate discharge materials reviewed at length with patient. Time for questions provided. Pt has no other questions at this time and verbalizes understanding of all provided materials.  

## 2022-10-27 ENCOUNTER — Telehealth: Payer: Self-pay | Admitting: Urology

## 2022-10-27 DIAGNOSIS — R338 Other retention of urine: Secondary | ICD-10-CM | POA: Diagnosis not present

## 2022-10-27 DIAGNOSIS — N312 Flaccid neuropathic bladder, not elsewhere classified: Secondary | ICD-10-CM | POA: Diagnosis not present

## 2022-10-27 NOTE — Telephone Encounter (Signed)
Patient is being seen at [facility] today, per her daughter.

## 2022-10-27 NOTE — Telephone Encounter (Signed)
Had a super public cath placed and it is not draining right. Only able to get a little urine out. Her Dr. Mena Goes is unable to get her in today. Wants cath removed and to be seen today [phone] .

## 2022-10-30 ENCOUNTER — Emergency Department (HOSPITAL_COMMUNITY): Payer: Medicare Other

## 2022-10-30 ENCOUNTER — Telehealth: Payer: Self-pay | Admitting: Cardiovascular Disease

## 2022-10-30 ENCOUNTER — Other Ambulatory Visit: Payer: Self-pay

## 2022-10-30 ENCOUNTER — Inpatient Hospital Stay (HOSPITAL_COMMUNITY)
Admission: EM | Admit: 2022-10-30 | Discharge: 2022-11-04 | DRG: 291 | Disposition: A | Discharge status: Home Health | Payer: Medicare Other | Attending: Internal Medicine | Admitting: Internal Medicine

## 2022-10-30 ENCOUNTER — Encounter (HOSPITAL_COMMUNITY): Payer: Self-pay | Admitting: Emergency Medicine

## 2022-10-30 DIAGNOSIS — Z83438 Family history of other disorder of lipoprotein metabolism and other lipidemia: Secondary | ICD-10-CM

## 2022-10-30 DIAGNOSIS — Z882 Allergy status to sulfonamides status: Secondary | ICD-10-CM | POA: Diagnosis not present

## 2022-10-30 DIAGNOSIS — Z823 Family history of stroke: Secondary | ICD-10-CM

## 2022-10-30 DIAGNOSIS — R0609 Other forms of dyspnea: Secondary | ICD-10-CM | POA: Diagnosis not present

## 2022-10-30 DIAGNOSIS — E871 Hypo-osmolality and hyponatremia: Secondary | ICD-10-CM | POA: Diagnosis present

## 2022-10-30 DIAGNOSIS — Z803 Family history of malignant neoplasm of breast: Secondary | ICD-10-CM

## 2022-10-30 DIAGNOSIS — Z96651 Presence of right artificial knee joint: Secondary | ICD-10-CM | POA: Diagnosis present

## 2022-10-30 DIAGNOSIS — J90 Pleural effusion, not elsewhere classified: Secondary | ICD-10-CM

## 2022-10-30 DIAGNOSIS — I509 Heart failure, unspecified: Secondary | ICD-10-CM | POA: Diagnosis not present

## 2022-10-30 DIAGNOSIS — Z85828 Personal history of other malignant neoplasm of skin: Secondary | ICD-10-CM

## 2022-10-30 DIAGNOSIS — I5022 Chronic systolic (congestive) heart failure: Secondary | ICD-10-CM | POA: Diagnosis not present

## 2022-10-30 DIAGNOSIS — R339 Retention of urine, unspecified: Secondary | ICD-10-CM | POA: Diagnosis present

## 2022-10-30 DIAGNOSIS — Z881 Allergy status to other antibiotic agents status: Secondary | ICD-10-CM | POA: Diagnosis not present

## 2022-10-30 DIAGNOSIS — M62838 Other muscle spasm: Secondary | ICD-10-CM | POA: Diagnosis present

## 2022-10-30 DIAGNOSIS — Z833 Family history of diabetes mellitus: Secondary | ICD-10-CM

## 2022-10-30 DIAGNOSIS — I5023 Acute on chronic systolic (congestive) heart failure: Secondary | ICD-10-CM | POA: Diagnosis present

## 2022-10-30 DIAGNOSIS — I4719 Other supraventricular tachycardia: Secondary | ICD-10-CM

## 2022-10-30 DIAGNOSIS — E782 Mixed hyperlipidemia: Secondary | ICD-10-CM | POA: Diagnosis present

## 2022-10-30 DIAGNOSIS — M810 Age-related osteoporosis without current pathological fracture: Secondary | ICD-10-CM | POA: Diagnosis present

## 2022-10-30 DIAGNOSIS — N179 Acute kidney failure, unspecified: Secondary | ICD-10-CM | POA: Diagnosis present

## 2022-10-30 DIAGNOSIS — R54 Age-related physical debility: Secondary | ICD-10-CM | POA: Diagnosis present

## 2022-10-30 DIAGNOSIS — Z800 Family history of malignant neoplasm of digestive organs: Secondary | ICD-10-CM

## 2022-10-30 DIAGNOSIS — I2489 Other forms of acute ischemic heart disease: Secondary | ICD-10-CM | POA: Diagnosis present

## 2022-10-30 DIAGNOSIS — Z9359 Other cystostomy status: Secondary | ICD-10-CM | POA: Diagnosis not present

## 2022-10-30 DIAGNOSIS — E861 Hypovolemia: Secondary | ICD-10-CM | POA: Diagnosis present

## 2022-10-30 DIAGNOSIS — Z79899 Other long term (current) drug therapy: Secondary | ICD-10-CM

## 2022-10-30 DIAGNOSIS — I420 Dilated cardiomyopathy: Secondary | ICD-10-CM | POA: Diagnosis not present

## 2022-10-30 DIAGNOSIS — I110 Hypertensive heart disease with heart failure: Secondary | ICD-10-CM | POA: Diagnosis not present

## 2022-10-30 DIAGNOSIS — Z853 Personal history of malignant neoplasm of breast: Secondary | ICD-10-CM

## 2022-10-30 DIAGNOSIS — I10 Essential (primary) hypertension: Secondary | ICD-10-CM | POA: Diagnosis present

## 2022-10-30 DIAGNOSIS — R7989 Other specified abnormal findings of blood chemistry: Secondary | ICD-10-CM | POA: Diagnosis not present

## 2022-10-30 DIAGNOSIS — Z888 Allergy status to other drugs, medicaments and biological substances status: Secondary | ICD-10-CM | POA: Diagnosis not present

## 2022-10-30 DIAGNOSIS — I4510 Unspecified right bundle-branch block: Secondary | ICD-10-CM | POA: Diagnosis present

## 2022-10-30 DIAGNOSIS — R0602 Shortness of breath: Secondary | ICD-10-CM | POA: Diagnosis not present

## 2022-10-30 DIAGNOSIS — Z880 Allergy status to penicillin: Secondary | ICD-10-CM

## 2022-10-30 DIAGNOSIS — I5043 Acute on chronic combined systolic (congestive) and diastolic (congestive) heart failure: Secondary | ICD-10-CM

## 2022-10-30 DIAGNOSIS — I517 Cardiomegaly: Secondary | ICD-10-CM | POA: Diagnosis not present

## 2022-10-30 DIAGNOSIS — Z9011 Acquired absence of right breast and nipple: Secondary | ICD-10-CM

## 2022-10-30 DIAGNOSIS — Z8249 Family history of ischemic heart disease and other diseases of the circulatory system: Secondary | ICD-10-CM | POA: Diagnosis not present

## 2022-10-30 LAB — CBC WITH DIFFERENTIAL/PLATELET
Abs Immature Granulocytes: 0.03 10*3/uL (ref 0.00–0.07)
Basophils Absolute: 0.1 10*3/uL (ref 0.0–0.1)
Basophils Relative: 1 %
Eosinophils Absolute: 0.3 10*3/uL (ref 0.0–0.5)
Eosinophils Relative: 5 %
HCT: 41.6 % (ref 36.0–46.0)
Hemoglobin: 13.1 g/dL (ref 12.0–15.0)
Immature Granulocytes: 0 %
Lymphocytes Relative: 13 %
Lymphs Abs: 0.9 10*3/uL (ref 0.7–4.0)
MCH: 28.2 pg (ref 26.0–34.0)
MCHC: 31.5 g/dL (ref 30.0–36.0)
MCV: 89.7 fL (ref 80.0–100.0)
Monocytes Absolute: 0.8 10*3/uL (ref 0.1–1.0)
Monocytes Relative: 11 %
Neutro Abs: 4.7 10*3/uL (ref 1.7–7.7)
Neutrophils Relative %: 70 %
Platelets: 240 10*3/uL (ref 150–400)
RBC: 4.64 MIL/uL (ref 3.87–5.11)
RDW: 15.7 % — ABNORMAL HIGH (ref 11.5–15.5)
WBC: 6.8 10*3/uL (ref 4.0–10.5)
nRBC: 0 % (ref 0.0–0.2)

## 2022-10-30 LAB — BASIC METABOLIC PANEL
Anion gap: 9 (ref 5–15)
BUN: 22 mg/dL (ref 8–23)
CO2: 26 mmol/L (ref 22–32)
Calcium: 8.9 mg/dL (ref 8.9–10.3)
Chloride: 98 mmol/L (ref 98–111)
Creatinine, Ser: 0.81 mg/dL (ref 0.44–1.00)
GFR, Estimated: >60 mL/min (ref >60)
Glucose, Bld: 124 mg/dL — ABNORMAL HIGH (ref 70–99)
Potassium: 3.8 mmol/L (ref 3.5–5.1)
Sodium: 133 mmol/L — ABNORMAL LOW (ref 135–145)

## 2022-10-30 LAB — BRAIN NATRIURETIC PEPTIDE: B Natriuretic Peptide: 674.3 pg/mL — ABNORMAL HIGH (ref 0.0–100.0)

## 2022-10-30 LAB — TROPONIN I (HIGH SENSITIVITY): Troponin I (High Sensitivity): 32 ng/L — ABNORMAL HIGH (ref <18)

## 2022-10-30 MED ORDER — FUROSEMIDE 10 MG/ML IJ SOLN
20.0000 mg | Freq: Once | INTRAMUSCULAR | Status: AC
  Administered 2022-10-31: 20 mg via INTRAVENOUS
  Filled 2022-10-30: qty 4

## 2022-10-30 NOTE — Telephone Encounter (Signed)
Returned call to pt niece and spoke with her about scheduling an appt sooner. Was able to get them an appt for 11/10/22 with Dr. Allyson Sabal. However, after speaking with the niece pt is having extreme SOB, fatigue and can barely walk due to the swelling in her legs. Pt has recently been diagnosed with COPD and CHF. No chest pain or pressure at this time. This nurse advised niece to take pt to the ER-she verbalized understand and states that she is going to take her to [facility].

## 2022-10-30 NOTE — ED Provider Notes (Signed)
[place] EMERGENCY DEPARTMENT AT [facility] Ed Provider Note   CSN: [id number] Arrival date & time: 10/30/22  1928     History {Add pertinent medical, surgical, social history, OB history to HPI:1} Chief Complaint  Patient presents with   Shortness of Breath    Mary [name] is a 89 y.o. female with PMH as listed below who presents with SOB, Progressive, worsening x 2 weeks associated w/ DOE, Orthopnea. Even lying down for a moment makes her extremely SOB. No formal dx of CHF however seen in ED 4 days ago chest xray with effusions, elevated BNP, mildly elevated trop, left because wanted to f/u with PCP. Did have referral placed to cardiology as well but can't see them til October. No fever, cough, chest pain. Her legs have felt very heavy recently and she also has had a lot of generalized weakness, fatigue. Has suprapubic catheter d/t h/o urinary retention.  Past Medical History:  Diagnosis Date   Alopecia    Ashkenazi Jewish ancestry    Cancer [facility])    External hemorrhoid    Foley catheter in place    Heart palpitations no cardiologist--  monitored by pcp   per pt "has been on verapamil and lanoxin for years and no palpitations for a very long time"   History of acute bronchitis    dx 12-21-2014  finished [facility]   History of adenomatous polyp of colon    2003   History of basal cell carcinoma excision    2006  left nasolabial fold   History of benign colon tumor    2007  --  HIGH GRADE HYPERPLASTIC ,  S/P  LEFT HEMICOLECTOMY   History of breast cancer ONCOLOGIST-  DR Darnelle Catalan--  antiestogen therapy with femera completed 12/  2012--  no recurrence   dx 10/  2007  right breast Invasive DCIS, grade 2, Stage 2A, pT2  pN0 pMX,  (ER 100% /PR 3% +),  HER +3) with 1 metastatic axill node---  s/p  right mastectomy    History of kidney stones    Hypertension    Hypotonic bladder UROLOGIST-  DR Patsi Sears   Mixed hyperlipidemia    OA (osteoarthritis)    hip   Osteoporosis     DEXA 01/2009; T-3.2 L fem neck, -2.4 L radius   Urinary retention    02-13-2015       Home Medications Prior to Admission medications   Medication Sig Start Date End Date Taking? Authorizing Provider  acetaminophen (TYLENOL) 500 MG tablet Take 1,000 mg by mouth 3 (three) times daily.    [provider]  albuterol (VENTOLIN HFA) 108 (90 Base) MCG/ACT inhaler Inhale 2 puffs into the lungs every 6 (six) hours as needed for wheezing or shortness of breath. 06/21/22   Joselyn Arrow, MD  atorvastatin (LIPITOR) 40 MG tablet TAKE 1 TABLET(40 MG) BY MOUTH DAILY 09/18/22   Joselyn Arrow, MD  bisacodyl (DULCOLAX) 5 MG EC tablet Take 5 mg by mouth daily as needed for moderate constipation.    [provider]  Calcium Carbonate-Vitamin D (CALCIUM 600+D PO) Take 1 tablet by mouth daily.    [provider]  Cholecalciferol (VITAMIN D3) 50 MCG (2000 UT) TABS Take 2,000 Units by mouth daily. 08/03/20   Magrinat, Valentino Hue, MD  denosumab (PROLIA) 60 MG/ML SOLN injection Inject 60 mg into the skin every 6 (six) months. Administer in upper arm, thigh, or abdomen 10/24/12   [provider]  diazepam (VALIUM) 2 MG tablet  TAKE 1 TABLET(2 MG) BY MOUTH EVERY 8 HOURS AS NEEDED FOR MUSCLE SPASMS 09/27/22   Joselyn Arrow, MD  fluticasone (CUTIVATE) 0.005 % ointment Apply 1 Application topically 2 (two) times daily.    [provider]  hydrocortisone (ANUSOL-HC) 2.5 % rectal cream Place 1 application. rectally 2 (two) times daily. 07/18/21   Joselyn Arrow, MD  loratadine (CLARITIN) 10 MG tablet Take 10 mg by mouth daily as needed for allergies.    [provider]  Multiple Vitamins-Minerals (PRESERVISION AREDS 2) CAPS Take 1 capsule by mouth 2 (two) times daily.    [provider]  polyethylene glycol (MIRALAX / GLYCOLAX) packet Take 17 g by mouth daily as needed for moderate constipation.    [provider]  Polyvinyl Alcohol-Povidone (REFRESH OP) Place 1 drop into both  eyes 2 (two) times daily.    [provider]  triamcinolone cream (KENALOG) 0.1 % Apply 1 Application topically 2 (two) times daily. 05/10/22   Joselyn Arrow, MD  verapamil (VERELAN) 360 MG 24 hr capsule TAKE 1 CAPSULE(360 MG) BY MOUTH AT BEDTIME 08/30/22   Joselyn Arrow, MD      Allergies    Arimidex [anastrozole], Avelox [moxifloxacin hcl in nacl], Azithromycin, Bactrim, Biaxin [clarithromycin], Nitrofurantoin, Penicillins, Sulfa antibiotics, and Sulfur    Review of Systems   Review of Systems A 10 point review of systems was performed and is negative unless otherwise reported in HPI.  Physical Exam Updated Vital Signs BP (!) 191/103   Pulse 93   Temp 97.8 F (36.6 C)   Resp 19   LMP  (LMP Unknown)   SpO2 97%  Physical Exam General: Normal appearing female, lying in bed.  HEENT: PERRLA, Sclera anicteric, MMM, trachea midline.  Cardiology: RRR, no murmurs/rubs/gallops. BL radial and DP pulses equal bilaterally.  Resp: Normal respiratory rate and effort. CTAB, no wheezes, rhonchi, crackles.  Abd: Soft, non-tender, non-distended. No rebound tenderness or guarding.  GU: Deferred. MSK: No peripheral edema or signs of trauma. Extremities without deformity or TTP. No cyanosis or clubbing. Skin: warm, dry. No rashes or lesions. Back: No CVA tenderness Neuro: A&Ox4, CNs II-XII grossly intact. MAEs. Sensation grossly intact.  Psych: Normal mood and affect.   ED Results / Procedures / Treatments   Labs (all labs ordered are listed, but only abnormal results are displayed) Labs Reviewed  CBC WITH DIFFERENTIAL/PLATELET - Abnormal; Notable for the following components:      Result Value   RDW 15.7 (*)    All other components within normal limits  BASIC METABOLIC PANEL - Abnormal; Notable for the following components:   Sodium 133 (*)    Glucose, Bld 124 (*)    All other components within normal limits  BRAIN NATRIURETIC PEPTIDE - Abnormal; Notable for the following components:   B  Natriuretic Peptide 674.3 (*)    All other components within normal limits  TROPONIN I (HIGH SENSITIVITY) - Abnormal; Notable for the following components:   Troponin I (High Sensitivity) 32 (*)    All other components within normal limits  TROPONIN I (HIGH SENSITIVITY)    EKG EKG Interpretation Date/Time:  Monday October 30 2022 20:55:27 EDT Ventricular Rate:  94 PR Interval:  120 QRS Duration:  120 QT Interval:  372 QTC Calculation: 465 R Axis:   -39  Text Interpretation: Sinus rhythm with Premature supraventricular complexes Left axis deviation Pulmonary disease pattern Right bundle branch block Similar to prior EKGs Confirmed by Vivi Barrack [phone]) on 10/30/2022 9:45:40 PM  Radiology DG Chest 2 View  Result Date: 10/30/2022 CLINICAL DATA:  Shortness of breath with talking and ambulating, worse over the past 2 weeks. EXAM: CHEST - 2 VIEW COMPARISON:  10/26/2022 FINDINGS: Mild cardiac enlargement. No vascular congestion, edema, or consolidation. Small bilateral pleural effusions, greater on the right, similar to prior study. No pneumothorax. Mediastinal contours appear intact. Degenerative changes in the spine and shoulders. IMPRESSION: Small bilateral pleural effusions, greater on the right. Similar appearance to prior study. Electronically Signed   By: Burman Nieves M.D.   On: 10/30/2022 20:17    Procedures Procedures  {Document cardiac monitor, telemetry assessment procedure when appropriate:1}  Medications Ordered in ED Medications  furosemide (LASIX) injection 20 mg (has no administration in time range)    ED Course/ Medical Decision Making/ A&P                          Medical Decision Making Amount and/or Complexity of Data Reviewed Labs:  Decision-making details documented in ED Course. Radiology:  Decision-making details documented in ED Course.  Risk Prescription drug management. Decision regarding hospitalization.    This patient presents to the ED for  concern of ***, this involves an extensive number of treatment options, and is a complaint that carries with it a high risk of complications and morbidity.  I considered the following differential and admission for this acute, potentially life threatening condition.   MDM:    DDX for dyspnea includes but is not limited to:  Cardiac- CHF, Myocardial Ischemia, Valvular heart disease, Arrythmia, Cardiac tamponade   Respiratory - Pneumonia / atelectasis / pulmonary effusion / cavitary lung disease, Pneumothorax, COPD/ reactive airway disease, PE, ARDS   Other - Sepsis, Anemia     Clinical Course as of 10/30/22 2343  Mon Oct 30, 2022  2144 Basic metabolic panel(!) Unremarkable in the context of this patient's presentation  [HN]  2144 CBC with Differential(!) Unremarkable in the context of this patient's presentation  [HN]  2145 Troponin I (High Sensitivity)(!): 32 Mildly elevated, flat from 4 days ago [HN]  2145 DG Chest 2 View Small bilateral pleural effusions, greater on the right. Similar appearance to prior study.   [HN]  2146 BNP from a few days ago was elevated >500. Will treat w/ 20 mg IV lasix as she is lasix naive [HN]  2207 B Natriuretic Peptide(!): 674.3 [HN]    Clinical Course User Index [HN] Loetta Rough, MD    Labs: I Ordered, and personally interpreted labs.  The pertinent results include:  those listed above  Imaging Studies ordered: CXR ordered from triage I independently visualized and interpreted imaging. I agree with the radiologist interpretation  Additional history obtained from niece at bedside, chart review  Cardiac Monitoring: The patient was maintained on a cardiac monitor.  I personally viewed and interpreted the cardiac monitored which showed an underlying rhythm of: sinus rhythm  Social Determinants of Health: Lives independently  Disposition:  ***  Co morbidities that complicate the patient evaluation  Past Medical History:   Diagnosis Date   Alopecia    Ashkenazi Jewish ancestry    Cancer [facility]    External hemorrhoid    Foley catheter in place    Heart palpitations no cardiologist--  monitored by pcp   per pt "has been on verapamil and lanoxin for years and no palpitations for a very long time"   History of acute bronchitis    dx 12-21-2014  finished [facility]PAK  History of adenomatous polyp of colon    2003   History of basal cell carcinoma excision    2006  left nasolabial fold   History of benign colon tumor    2007  --  HIGH GRADE HYPERPLASTIC ,  S/P  LEFT HEMICOLECTOMY   History of breast cancer ONCOLOGIST-  DR Darnelle Catalan--  antiestogen therapy with femera completed 12/  2012--  no recurrence   dx 10/  2007  right breast Invasive DCIS, grade 2, Stage 2A, pT2  pN0 pMX,  (ER 100% /PR 3% +),  HER +3) with 1 metastatic axill node---  s/p  right mastectomy    History of kidney stones    Hypertension    Hypotonic bladder UROLOGIST-  DR Patsi Sears   Mixed hyperlipidemia    OA (osteoarthritis)    hip   Osteoporosis    DEXA 01/2009; T-3.2 L fem neck, -2.4 L radius   Urinary retention    02-13-2015     Medicines Meds ordered this encounter  Medications   furosemide (LASIX) injection 20 mg    I have reviewed the patients home medicines and have made adjustments as needed  Problem List / ED Course: Problem List Items Addressed This Visit       Cardiovascular and Mediastinum   Heart failure (HCC) - Primary   Relevant Medications   furosemide (LASIX) injection 20 mg     Respiratory   Pleural effusion, bilateral     Other   Dyspnea on exertion         {Document critical care time when appropriate:1} {Document review of labs and clinical decision tools ie heart score, Chads2Vasc2 etc:1}  {Document your independent review of radiology images, and any outside records:1} {Document your discussion with family members, caretakers, and with consultants:1} {Document social determinants of health  affecting pt's care:1} {Document your decision making why or why not admission, treatments were needed:1}  This note was created using dictation software, which may contain spelling or grammatical errors.

## 2022-10-30 NOTE — Telephone Encounter (Signed)
New patient's niece is calling stating that they know Dr. Allyson Sabal personally and are also patients of Dr. Allyson Sabal and would like to know if they can get this patient in for a sooner available appt.

## 2022-10-30 NOTE — ED Provider Triage Note (Signed)
Emergency Medicine Provider Triage Evaluation Note  Mary [name] , a 89 y.o. female  was evaluated in triage.  Pt complains of sob, Progressive, worsening. DOE, Orthopnea. No formal dx of CHF however seen in ED 4 days ago chest xray with effusions, elevated BNP, trop, left because wanted to fu with PCP. Sx worsening cannot get in to see Cards for a new weeks. NO fever, cough  Review of Systems  Positive: Chest heaviness, SOB, LE swelling Negative: Fever, cough  Physical Exam  BP (!) 167/142   Pulse 75   Temp (!) 97.5 F (36.4 C) (Oral)   Resp 19   LMP  (LMP Unknown)   SpO2 98%  Gen:   Awake, no distress   Resp:  Normal effort  MSK:   Moves extremities without difficulty, pitting edema to BLE Other:    Medical Decision Making  Medically screening exam initiated at 7:40 PM.  Appropriate orders placed.  Mary [name] was informed that the remainder of the evaluation will be completed by another provider, this initial triage assessment does not replace that evaluation, and the importance of remaining in the ED until their evaluation is complete.  SOB, LE edema   ,  A, PA-C 10/30/22 1941

## 2022-10-30 NOTE — ED Notes (Signed)
Hospitalist at bedside 

## 2022-10-30 NOTE — H&P (Incomplete)
History and Physical   [facility] [place] [facility] Admission History and Physical [employer], D.O.    Patient Name: Mary [name] MR#: [id number] Date of Birth: 1926/05/28 Date of Admission: 10/30/2022  Referring MD/NP/PA: Dr. Jearld Fenton Primary Care Physician: Joselyn Arrow, MD  Chief Complaint:  Chief Complaint  Patient presents with   Shortness of Breath    HPI: Mary [name] is a 89 y.o. female with a known history of *** presents to the emergency department for evaluation of ***.  Patient was in a usual state of health until ***.  Patient denies fevers/chills, weakness, dizziness, chest pain, shortness of breath, N/V/C/D, abdominal pain, dysuria/frequency, changes in mental status.    Otherwise there has been no change in status. Patient has been taking medication as prescribed and there has been no recent change in medication or diet.  No recent antibiotics.  There has been no recent illness, hospitalizations, travel or sick contacts.    EMS/ED Course: Patient received ***. Medical admission has been requested for further management of ***.  Review of Systems:  CONSTITUTIONAL: No fever/chills, fatigue, weakness, weight gain/loss, headache. EYES: No blurry or double vision. ENT: No tinnitus, postnasal drip, redness or soreness of the oropharynx. RESPIRATORY: No cough, dyspnea, wheeze.  No hemoptysis.  CARDIOVASCULAR: No chest pain, palpitations, syncope, orthopnea. No lower extremity edema.  GASTROINTESTINAL: No nausea, vomiting, abdominal pain, diarrhea, constipation.  No hematemesis, melena or hematochezia. GENITOURINARY: No dysuria, frequency, hematuria. ENDOCRINE: No polyuria or nocturia. No heat or cold intolerance. HEMATOLOGY: No anemia, bruising, bleeding. INTEGUMENTARY: No rashes, ulcers, lesions. MUSCULOSKELETAL: No arthritis, gout. NEUROLOGIC: No numbness, tingling, ataxia, seizure-type activity, weakness. PSYCHIATRIC: No anxiety, depression,  insomnia.   Past Medical History:  Diagnosis Date   Alopecia    Ashkenazi Jewish ancestry    Cancer [facility])    External hemorrhoid    Foley catheter in place    Heart palpitations no cardiologist--  monitored by pcp   per pt "has been on verapamil and lanoxin for years and no palpitations for a very long time"   History of acute bronchitis    dx 12-21-2014  finished [facility]   History of adenomatous polyp of colon    2003   History of basal cell carcinoma excision    2006  left nasolabial fold   History of benign colon tumor    2007  --  HIGH GRADE HYPERPLASTIC ,  S/P  LEFT HEMICOLECTOMY   History of breast cancer ONCOLOGIST-  DR Darnelle Catalan--  antiestogen therapy with femera completed 12/  2012--  no recurrence   dx 10/  2007  right breast Invasive DCIS, grade 2, Stage 2A, pT2  pN0 pMX,  (ER 100% /PR 3% +),  HER +3) with 1 metastatic axill node---  s/p  right mastectomy    History of kidney stones    Hypertension    Hypotonic bladder UROLOGIST-  DR Patsi Sears   Mixed hyperlipidemia    OA (osteoarthritis)    hip   Osteoporosis    DEXA 01/2009; T-3.2 L fem neck, -2.4 L radius   Urinary retention    02-13-2015    Past Surgical History:  Procedure Laterality Date   APPENDECTOMY  age 35   BREAST LUMPECTOMY WITH NEEDLE LOCALIZATION AND AXILLARY SENTINEL LYMPH NODE BX Right 01-23-2006   CARPAL TUNNEL RELEASE Right 04-24-2007   and Pulley Release index and small finger   CATARACT EXTRACTION W/ INTRAOCULAR LENS  IMPLANT, BILATERAL  left 1991  &  right 1993  COLONOSCOPY  last one 2013   CYSTOSCOPY N/A 07/12/2015   Procedure: CYSTOSCOPY;  Surgeon: Jethro Bolus, MD;  Location: [facility];  Service: Urology;  Laterality: N/A;   CYSTOSCOPY WITH BIOPSY N/A 03/01/2015   Procedure: CYSTOSCOPY WITH TAUBER BIOPSY OF 1CM RIGHT LATERAL BLADDER WALL AND CAUTERIZATION OF BIOPSY SITE ;  Surgeon: Jethro Bolus, MD;  Location: [facility];  Service: Urology;   Laterality: N/A;   CYSTOSCOPY WITH LITHOLAPAXY N/A 09/06/2021   Procedure: CYSTOSCOPY WITH LITHOLAPAXY < 2cm, HOLMIUM LASER LITHOTRIPSY, BILATERAL RETROGRADE PYELOGRAM, SUPRAPUBIC CATHETER EXCHANGE;  Surgeon: Jerilee Field, MD;  Location: [facility];  Service: Urology;  Laterality: N/A;  ONLY NEEDS 90 MIN   INSERTION OF SUPRAPUBIC CATHETER N/A 07/12/2015   Procedure: INSERTION OF SUPRAPUBIC CATHETER;  Surgeon: Jethro Bolus, MD;  Location: [facility];  Service: Urology;  Laterality: N/A;   LAPAROSCOPIC ASSISTED LEFT HEMICOLECTOMY  06/  2007   high grade hyperplastic mass   MASTECTOMY Right 02-28-2006   PARTIAL KNEE ARTHROPLASTY Right 01/24/2013   Procedure: RIGHT KNEE MEDIAL UNICOMPARTMENTAL ARTHROPLASTY;  Surgeon: Loanne Drilling, MD;  Location: [facility];  Service: Orthopedics;  Laterality: Right;   PULLEY RELEASE LEFT INDEX AND SMALL FINGER  08-07-2007   TONSILLECTOMY  age 59     reports that she has never smoked. She has never used smokeless tobacco. She reports that she does not drink alcohol and does not use drugs.  Allergies  Allergen Reactions   Arimidex [Anastrozole] Rash   Avelox [Moxifloxacin Hcl In Nacl] Nausea Only and Rash   Azithromycin Nausea Only and Rash   Bactrim Nausea Only and Rash   Biaxin [Clarithromycin] Nausea Only and Rash   Nitrofurantoin Nausea Only, Rash and Other (See Comments)   Penicillins Nausea Only and Rash    Has patient had a PCN reaction causing immediate rash, facial/tongue/throat swelling, SOB or lightheadedness with hypotension: no Has patient had a PCN reaction causing severe rash involving mucus membranes or skin necrosis: no Has patient had a PCN reaction that required hospitalization no Has patient had a PCN reaction occurring within the last 10 years no If all of the above answers are "NO", then may proceed with Cephalosporin use.    Sulfa Antibiotics Nausea And Vomiting and Rash   Sulfur Nausea And Vomiting and Rash     Family History  Problem Relation Age of Onset   Hypertension Mother    Stroke Father    Diabetes Father    Hyperlipidemia Brother    Atrial fibrillation Brother    Hyperlipidemia Son    Breast cancer Paternal Aunt        dx 11s; deceased early 70s   Breast cancer Cousin        paternal first cousin   Colon cancer Cousin        paternal first cousin; age dx 29s    Prior to Admission medications   Medication Sig Start Date End Date Taking? Authorizing Provider  acetaminophen (TYLENOL) 500 MG tablet Take 1,000 mg by mouth 3 (three) times daily.    [provider]  albuterol (VENTOLIN HFA) 108 (90 Base) MCG/ACT inhaler Inhale 2 puffs into the lungs every 6 (six) hours as needed for wheezing or shortness of breath. 06/21/22   Joselyn Arrow, MD  atorvastatin (LIPITOR) 40 MG tablet TAKE 1 TABLET(40 MG) BY MOUTH DAILY 09/18/22   Joselyn Arrow, MD  bisacodyl (DULCOLAX) 5 MG EC tablet Take 5 mg by mouth daily as needed  for moderate constipation.    [provider]  Calcium Carbonate-Vitamin D (CALCIUM 600+D PO) Take 1 tablet by mouth daily.    [provider]  Cholecalciferol (VITAMIN D3) 50 MCG (2000 UT) TABS Take 2,000 Units by mouth daily. 08/03/20   Magrinat, Valentino Hue, MD  denosumab (PROLIA) 60 MG/ML SOLN injection Inject 60 mg into the skin every 6 (six) months. Administer in upper arm, thigh, or abdomen 10/24/12   [provider]  diazepam (VALIUM) 2 MG tablet TAKE 1 TABLET(2 MG) BY MOUTH EVERY 8 HOURS AS NEEDED FOR MUSCLE SPASMS 09/27/22   Joselyn Arrow, MD  fluticasone (CUTIVATE) 0.005 % ointment Apply 1 Application topically 2 (two) times daily.    [provider]  hydrocortisone (ANUSOL-HC) 2.5 % rectal cream Place 1 application. rectally 2 (two) times daily. 07/18/21   Joselyn Arrow, MD  loratadine (CLARITIN) 10 MG tablet Take 10 mg by mouth daily as needed for allergies.    [provider]  Multiple Vitamins-Minerals (PRESERVISION AREDS 2) CAPS  Take 1 capsule by mouth 2 (two) times daily.    [provider]  polyethylene glycol (MIRALAX / GLYCOLAX) packet Take 17 g by mouth daily as needed for moderate constipation.    [provider]  Polyvinyl Alcohol-Povidone (REFRESH OP) Place 1 drop into both eyes 2 (two) times daily.    [provider]  triamcinolone cream (KENALOG) 0.1 % Apply 1 Application topically 2 (two) times daily. 05/10/22   Joselyn Arrow, MD  verapamil (VERELAN) 360 MG 24 hr capsule TAKE 1 CAPSULE(360 MG) BY MOUTH AT BEDTIME 08/30/22   Joselyn Arrow, MD    Physical Exam: Vitals:   10/30/22 1927  BP: (!) 167/142  Pulse: 75  Resp: 19  Temp: (!) 97.5 F (36.4 C)  TempSrc: Oral  SpO2: 98%    GENERAL: 89 y.o.-year-old *** patient, well-developed, well-nourished lying in the bed in no acute distress.  Pleasant and cooperative.   HEENT: Head atraumatic, normocephalic. Pupils equal. Mucus membranes moist. NECK: Supple. No JVD. CHEST: Normal breath sounds bilaterally. No wheezing, rales, rhonchi or crackles. No use of accessory muscles of respiration.  No reproducible chest wall tenderness.  CARDIOVASCULAR: S1, S2 normal. No murmurs, rubs, or gallops. Cap refill <2 seconds. Pulses intact distally.  ABDOMEN: Soft, nondistended, nontender. No rebound, guarding, rigidity. Normoactive bowel sounds present in all four quadrants.  EXTREMITIES: No pedal edema, cyanosis, or clubbing. No calf tenderness or Homan's sign.  NEUROLOGIC: The patient is alert and oriented x 3. Cranial nerves II through XII are grossly intact with no focal sensorimotor deficit. PSYCHIATRIC:  Normal affect, mood, thought content. SKIN: Warm, dry, and intact without obvious rash, lesion, or ulcer.    Labs on Admission:  CBC: Recent Labs  Lab 10/26/22 1835 10/30/22 2040  WBC 6.2 6.8  NEUTROABS 4.4 4.7  HGB 12.7 13.1  HCT 39.2 41.6  MCV 88.7 89.7  PLT 235 240   Basic Metabolic Panel: Recent Labs  Lab 10/26/22 1835  10/30/22 2040  NA 137 133*  K 3.9 3.8  CL 99 98  CO2 31 26  GLUCOSE 107* 124*  BUN 19 22  CREATININE 0.63 0.81  CALCIUM 9.4 8.9   GFR: Estimated Creatinine Clearance: 34.3 mL/min (by C-G formula based on SCr of 0.81 mg/dL). Liver Function Tests: Recent Labs  Lab 10/26/22 1835  AST 20  ALT 24  ALKPHOS 87  BILITOT 0.5  PROT 6.6  ALBUMIN 3.9   No results for input(s): "LIPASE", "AMYLASE"  in the last 168 hours. No results for input(s): "AMMONIA" in the last 168 hours. Coagulation Profile: No results for input(s): "INR", "PROTIME" in the last 168 hours. Cardiac Enzymes: No results for input(s): "CKTOTAL", "CKMB", "CKMBINDEX", "TROPONINI" in the last 168 hours. BNP (last 3 results) No results for input(s): "PROBNP" in the last 8760 hours. HbA1C: No results for input(s): "HGBA1C" in the last 72 hours. CBG: No results for input(s): "GLUCAP" in the last 168 hours. Lipid Profile: No results for input(s): "CHOL", "HDL", "LDLCALC", "TRIG", "CHOLHDL", "LDLDIRECT" in the last 72 hours. Thyroid Function Tests: No results for input(s): "TSH", "T4TOTAL", "FREET4", "T3FREE", "THYROIDAB" in the last 72 hours. Anemia Panel: No results for input(s): "VITAMINB12", "FOLATE", "FERRITIN", "TIBC", "IRON", "RETICCTPCT" in the last 72 hours. Urine analysis:    Component Value Date/Time   COLORURINE YELLOW 03/22/2022 1137   APPEARANCEUR CLEAR 03/22/2022 1137   LABSPEC 1.015 03/22/2022 1137   PHURINE 5.0 03/22/2022 1137   GLUCOSEU NEGATIVE 03/22/2022 1137   HGBUR MODERATE (A) 03/22/2022 1137   BILIRUBINUR NEGATIVE 03/22/2022 1137   BILIRUBINUR negative 12/25/2020 1629   BILIRUBINUR neg 04/10/2016 1442   KETONESUR 5 (A) 03/22/2022 1137   PROTEINUR 100 (A) 03/22/2022 1137   UROBILINOGEN 0.2 12/25/2020 1629   UROBILINOGEN 0.2 02/22/2013 0447   NITRITE NEGATIVE 03/22/2022 1137   LEUKOCYTESUR MODERATE (A) 03/22/2022 1137   Sepsis Labs: @LABRCNTIP (procalcitonin:4,lacticidven:4) )No results  found for this or any previous visit (from the past 240 hour(s)).   Radiological Exams on Admission: DG Chest 2 View  Result Date: 10/30/2022 CLINICAL DATA:  Shortness of breath with talking and ambulating, worse over the past 2 weeks. EXAM: CHEST - 2 VIEW COMPARISON:  10/26/2022 FINDINGS: Mild cardiac enlargement. No vascular congestion, edema, or consolidation. Small bilateral pleural effusions, greater on the right, similar to prior study. No pneumothorax. Mediastinal contours appear intact. Degenerative changes in the spine and shoulders. IMPRESSION: Small bilateral pleural effusions, greater on the right. Similar appearance to prior study. Electronically Signed   By: Burman Nieves M.D.   On: 10/30/2022 20:17    EKG: Normal sinus rhythm at *** bpm with normal axis and nonspecific ST-T wave changes.   Assessment/Plan  This is a 89 y.o. female with a history of *** now being admitted with:  #. ***  #. ***  #. ***  #. History of *** - Continue ***  #. History of *** - Continue ***  #. History of *** - Continue ***  #. History of *** - Continue ***  #. History of *** - Continue ***  Admission status: *** IV Fluids: *** Diet/Nutrition: *** Consults called: ***  DVT Px: Lovenox, SCDs and early ambulation. Code Status: Full Code  Disposition Plan: To home in *** days  All the records are reviewed and case discussed with ED provider. Management plans discussed with the patient and/or family who express understanding and agree with plan of care.    D.O. on 10/30/2022 at 10:29 PM CC: Primary care physician; Joselyn Arrow, MD   10/30/2022, 10:29 PM

## 2022-10-30 NOTE — ED Triage Notes (Signed)
Pt arrives POV w/ c/o SHOB upon talking and ambulating. Increasing worse x 2weeks.

## 2022-10-31 ENCOUNTER — Observation Stay (HOSPITAL_COMMUNITY): Payer: Medicare Other

## 2022-10-31 DIAGNOSIS — R7989 Other specified abnormal findings of blood chemistry: Secondary | ICD-10-CM

## 2022-10-31 DIAGNOSIS — I509 Heart failure, unspecified: Secondary | ICD-10-CM | POA: Diagnosis not present

## 2022-10-31 DIAGNOSIS — Z833 Family history of diabetes mellitus: Secondary | ICD-10-CM | POA: Diagnosis not present

## 2022-10-31 DIAGNOSIS — E782 Mixed hyperlipidemia: Secondary | ICD-10-CM | POA: Diagnosis present

## 2022-10-31 DIAGNOSIS — E871 Hypo-osmolality and hyponatremia: Secondary | ICD-10-CM | POA: Diagnosis present

## 2022-10-31 DIAGNOSIS — Z8249 Family history of ischemic heart disease and other diseases of the circulatory system: Secondary | ICD-10-CM | POA: Diagnosis not present

## 2022-10-31 DIAGNOSIS — I110 Hypertensive heart disease with heart failure: Secondary | ICD-10-CM | POA: Diagnosis present

## 2022-10-31 DIAGNOSIS — Z882 Allergy status to sulfonamides status: Secondary | ICD-10-CM | POA: Diagnosis not present

## 2022-10-31 DIAGNOSIS — I10 Essential (primary) hypertension: Secondary | ICD-10-CM | POA: Diagnosis not present

## 2022-10-31 DIAGNOSIS — M62838 Other muscle spasm: Secondary | ICD-10-CM | POA: Diagnosis present

## 2022-10-31 DIAGNOSIS — M810 Age-related osteoporosis without current pathological fracture: Secondary | ICD-10-CM | POA: Diagnosis present

## 2022-10-31 DIAGNOSIS — Z853 Personal history of malignant neoplasm of breast: Secondary | ICD-10-CM | POA: Diagnosis not present

## 2022-10-31 DIAGNOSIS — Z823 Family history of stroke: Secondary | ICD-10-CM | POA: Diagnosis not present

## 2022-10-31 DIAGNOSIS — I420 Dilated cardiomyopathy: Secondary | ICD-10-CM | POA: Diagnosis present

## 2022-10-31 DIAGNOSIS — I5023 Acute on chronic systolic (congestive) heart failure: Secondary | ICD-10-CM

## 2022-10-31 DIAGNOSIS — I2489 Other forms of acute ischemic heart disease: Secondary | ICD-10-CM | POA: Diagnosis present

## 2022-10-31 DIAGNOSIS — R339 Retention of urine, unspecified: Secondary | ICD-10-CM | POA: Diagnosis present

## 2022-10-31 DIAGNOSIS — J90 Pleural effusion, not elsewhere classified: Secondary | ICD-10-CM | POA: Diagnosis not present

## 2022-10-31 DIAGNOSIS — Z9359 Other cystostomy status: Secondary | ICD-10-CM | POA: Diagnosis not present

## 2022-10-31 DIAGNOSIS — Z96651 Presence of right artificial knee joint: Secondary | ICD-10-CM | POA: Diagnosis present

## 2022-10-31 DIAGNOSIS — Z880 Allergy status to penicillin: Secondary | ICD-10-CM | POA: Diagnosis not present

## 2022-10-31 DIAGNOSIS — R0602 Shortness of breath: Secondary | ICD-10-CM

## 2022-10-31 DIAGNOSIS — Z79899 Other long term (current) drug therapy: Secondary | ICD-10-CM | POA: Diagnosis not present

## 2022-10-31 DIAGNOSIS — R0609 Other forms of dyspnea: Secondary | ICD-10-CM | POA: Diagnosis present

## 2022-10-31 DIAGNOSIS — N179 Acute kidney failure, unspecified: Secondary | ICD-10-CM | POA: Diagnosis present

## 2022-10-31 DIAGNOSIS — Z881 Allergy status to other antibiotic agents status: Secondary | ICD-10-CM | POA: Diagnosis not present

## 2022-10-31 DIAGNOSIS — I5022 Chronic systolic (congestive) heart failure: Secondary | ICD-10-CM | POA: Diagnosis not present

## 2022-10-31 DIAGNOSIS — E861 Hypovolemia: Secondary | ICD-10-CM | POA: Diagnosis present

## 2022-10-31 DIAGNOSIS — I4719 Other supraventricular tachycardia: Secondary | ICD-10-CM | POA: Diagnosis present

## 2022-10-31 DIAGNOSIS — Z85828 Personal history of other malignant neoplasm of skin: Secondary | ICD-10-CM | POA: Diagnosis not present

## 2022-10-31 DIAGNOSIS — Z888 Allergy status to other drugs, medicaments and biological substances status: Secondary | ICD-10-CM | POA: Diagnosis not present

## 2022-10-31 LAB — ECHOCARDIOGRAM COMPLETE
AR max vel: 1.99 cm2
AV Area VTI: 2.25 cm2
AV Area mean vel: 2.02 cm2
AV Mean grad: 3 mmHg
AV Peak grad: 4.9 mmHg
Ao pk vel: 1.11 m/s
Area-P 1/2: 5.54 cm2
Calc EF: 41.9 %
Est EF: 45
Height: 62 in
MV VTI: 1.76 cm2
S' Lateral: 3.6 cm
Single Plane A2C EF: 40.7 %
Single Plane A4C EF: 42.2 %
Weight: 1964.74 oz

## 2022-10-31 LAB — COMPREHENSIVE METABOLIC PANEL
ALT: 27 U/L (ref 0–44)
AST: 21 U/L (ref 15–41)
Albumin: 3.3 g/dL — ABNORMAL LOW (ref 3.5–5.0)
Alkaline Phosphatase: 83 U/L (ref 38–126)
Anion gap: 11 (ref 5–15)
BUN: 17 mg/dL (ref 8–23)
CO2: 25 mmol/L (ref 22–32)
Calcium: 8.5 mg/dL — ABNORMAL LOW (ref 8.9–10.3)
Chloride: 101 mmol/L (ref 98–111)
Creatinine, Ser: 0.65 mg/dL (ref 0.44–1.00)
GFR, Estimated: >60 mL/min (ref >60)
Glucose, Bld: 96 mg/dL (ref 70–99)
Potassium: 3.2 mmol/L — ABNORMAL LOW (ref 3.5–5.1)
Sodium: 137 mmol/L (ref 135–145)
Total Bilirubin: 0.7 mg/dL (ref 0.3–1.2)
Total Protein: 5.8 g/dL — ABNORMAL LOW (ref 6.5–8.1)

## 2022-10-31 LAB — CBC
HCT: 38.2 % (ref 36.0–46.0)
Hemoglobin: 12.1 g/dL (ref 12.0–15.0)
MCH: 28.7 pg (ref 26.0–34.0)
MCHC: 31.7 g/dL (ref 30.0–36.0)
MCV: 90.7 fL (ref 80.0–100.0)
Platelets: 226 10*3/uL (ref 150–400)
RBC: 4.21 MIL/uL (ref 3.87–5.11)
RDW: 15.6 % — ABNORMAL HIGH (ref 11.5–15.5)
WBC: 5.9 10*3/uL (ref 4.0–10.5)
nRBC: 0 % (ref 0.0–0.2)

## 2022-10-31 MED ORDER — FUROSEMIDE 10 MG/ML IJ SOLN
INTRAMUSCULAR | Status: AC
  Filled 2022-10-31: qty 2

## 2022-10-31 MED ORDER — FUROSEMIDE 10 MG/ML IJ SOLN
20.0000 mg | Freq: Every day | INTRAMUSCULAR | Status: DC
  Administered 2022-10-31: 20 mg via INTRAVENOUS
  Filled 2022-10-31: qty 2

## 2022-10-31 MED ORDER — IPRATROPIUM-ALBUTEROL 0.5-2.5 (3) MG/3ML IN SOLN: 3.0000 mL | Freq: Four times a day (QID) | RESPIRATORY_TRACT | Status: DC | PRN

## 2022-10-31 MED ORDER — POTASSIUM CHLORIDE CRYS ER 20 MEQ PO TBCR
40.0000 meq | EXTENDED_RELEASE_TABLET | ORAL | Status: AC
  Administered 2022-10-31 (×2): 40 meq via ORAL
  Filled 2022-10-31 (×2): qty 2

## 2022-10-31 MED ORDER — PROSIGHT PO TABS
1.0000 | ORAL_TABLET | Freq: Every day | ORAL | Status: DC
  Administered 2022-11-02 – 2022-11-03 (×2): 1 via ORAL
  Filled 2022-10-31 (×5): qty 1

## 2022-10-31 MED ORDER — OXYCODONE HCL 5 MG PO TABS
5.0000 mg | ORAL_TABLET | Freq: Four times a day (QID) | ORAL | Status: DC | PRN
  Administered 2022-10-31 – 2022-11-01 (×2): 5 mg via ORAL
  Filled 2022-10-31 (×2): qty 1

## 2022-10-31 MED ORDER — ENOXAPARIN SODIUM 40 MG/0.4ML IJ SOSY
40.0000 mg | PREFILLED_SYRINGE | INTRAMUSCULAR | Status: DC
  Administered 2022-10-31 – 2022-11-01 (×2): 40 mg via SUBCUTANEOUS
  Filled 2022-10-31 (×3): qty 0.4

## 2022-10-31 MED ORDER — FUROSEMIDE 10 MG/ML IJ SOLN
40.0000 mg | Freq: Every day | INTRAMUSCULAR | Status: DC
  Administered 2022-11-01: 40 mg via INTRAVENOUS
  Filled 2022-10-31: qty 4

## 2022-10-31 MED ORDER — OYSTER SHELL CALCIUM/D3 500-5 MG-MCG PO TABS
1.0000 | ORAL_TABLET | Freq: Every day | ORAL | Status: DC
  Administered 2022-11-01 – 2022-11-02 (×2): 1 via ORAL
  Filled 2022-10-31 (×5): qty 1

## 2022-10-31 MED ORDER — SODIUM CHLORIDE 0.9% FLUSH
3.0000 mL | Freq: Two times a day (BID) | INTRAVENOUS | Status: DC
  Administered 2022-10-31 – 2022-11-03 (×9): 3 mL via INTRAVENOUS

## 2022-10-31 MED ORDER — SODIUM CHLORIDE 0.9 % IV SOLN: 250.0000 mL | INTRAVENOUS | Status: DC | PRN

## 2022-10-31 MED ORDER — DIAZEPAM 2 MG PO TABS: 2.0000 mg | ORAL_TABLET | Freq: Three times a day (TID) | ORAL | Status: DC | PRN

## 2022-10-31 MED ORDER — CALCIUM CARB-CHOLECALCIFEROL 600-10 MG-MCG PO TABS: ORAL_TABLET | Freq: Every day | ORAL | Status: DC

## 2022-10-31 MED ORDER — VERAPAMIL HCL ER 180 MG PO TBCR
360.0000 mg | EXTENDED_RELEASE_TABLET | Freq: Every day | ORAL | Status: DC
  Administered 2022-10-31 – 2022-11-01 (×2): 360 mg via ORAL
  Filled 2022-10-31 (×2): qty 2

## 2022-10-31 MED ORDER — VITAMIN D 25 MCG (1000 UNIT) PO TABS
2000.0000 [IU] | ORAL_TABLET | Freq: Every day | ORAL | Status: DC
  Administered 2022-11-01 – 2022-11-04 (×4): 2000 [IU] via ORAL
  Filled 2022-10-31 (×5): qty 2

## 2022-10-31 MED ORDER — ORAL CARE MOUTH RINSE: 15.0000 mL | OROMUCOSAL | Status: DC | PRN

## 2022-10-31 MED ORDER — SODIUM CHLORIDE 0.9% FLUSH: 3.0000 mL | INTRAVENOUS | Status: DC | PRN

## 2022-10-31 MED ORDER — LORATADINE 10 MG PO TABS: 10.0000 mg | ORAL_TABLET | Freq: Every day | ORAL | Status: DC | PRN

## 2022-10-31 MED ORDER — PRESERVISION AREDS 2 PO CAPS: 1.0000 | ORAL_CAPSULE | Freq: Two times a day (BID) | ORAL | Status: DC

## 2022-10-31 MED ORDER — CARVEDILOL 6.25 MG PO TABS
6.2500 mg | ORAL_TABLET | Freq: Two times a day (BID) | ORAL | Status: DC
  Administered 2022-10-31 – 2022-11-01 (×2): 6.25 mg via ORAL
  Filled 2022-10-31 (×2): qty 1

## 2022-10-31 MED ORDER — CARVEDILOL 3.125 MG PO TABS
3.1250 mg | ORAL_TABLET | Freq: Two times a day (BID) | ORAL | Status: DC
  Administered 2022-10-31: 3.125 mg via ORAL
  Filled 2022-10-31: qty 1

## 2022-10-31 MED ORDER — ACETAMINOPHEN 325 MG PO TABS
650.0000 mg | ORAL_TABLET | ORAL | Status: DC | PRN
  Administered 2022-11-01: 650 mg via ORAL
  Filled 2022-10-31 (×3): qty 2

## 2022-10-31 MED ORDER — LISINOPRIL 5 MG PO TABS
2.5000 mg | ORAL_TABLET | Freq: Every day | ORAL | Status: DC
  Administered 2022-10-31 – 2022-11-01 (×2): 2.5 mg via ORAL
  Filled 2022-10-31 (×2): qty 1

## 2022-10-31 MED ORDER — ATORVASTATIN CALCIUM 40 MG PO TABS
40.0000 mg | ORAL_TABLET | Freq: Every day | ORAL | Status: DC
  Administered 2022-11-02 – 2022-11-04 (×3): 40 mg via ORAL
  Filled 2022-10-31 (×5): qty 1

## 2022-10-31 MED ORDER — ONDANSETRON HCL 4 MG/2ML IJ SOLN
4.0000 mg | Freq: Four times a day (QID) | INTRAMUSCULAR | Status: DC | PRN
  Administered 2022-11-01: 4 mg via INTRAVENOUS
  Filled 2022-10-31: qty 2

## 2022-10-31 NOTE — TOC Initial Note (Signed)
Transition of Care [facility]) - Initial/Assessment Note    Patient Details  Name: Mary [name] MRN: [id number] Date of Birth: 15-Nov-1926  [facility]) CM/SW Contact:    Lanier Clam, RN Phone Number: 10/31/2022, 4:43 PM  Clinical Narrative:  Spoke to niece Donna-states patient home alone-indep w/supra pubic cath, has rw. Ptr [name] will be here tomorrow from [place](listed on contacts) PT eval-await recc.                Expected Discharge Plan: Home/Self Care Barriers to Discharge: Continued Medical Work up   Patient Goals and CMS Choice Patient states their goals for this hospitalization and ongoing recovery are:: Home CMS Medicare.gov Compare Post Acute Care list provided to:: Patient Choice offered to / list presented to : Patient [place] ownership interest in [facility].provided to:: Patient    Expected Discharge Plan and Services   Discharge Planning Services: CM Consult   Living arrangements for the past 2 months: Single Family Home                                      Prior Living Arrangements/Services Living arrangements for the past 2 months: Single Family Home Lives with:: Self Patient language and need for interpreter reviewed:: Yes Do you feel safe going back to the place where you live?: Yes      Need for Family Participation in Patient Care: Yes (Comment) Care giver support system in place?: Yes (comment) Current home services: DME (rw) Criminal Activity/Legal Involvement Pertinent to Current Situation/Hospitalization: No - Comment as needed  Activities of Daily Living Home Assistive Devices/Equipment: Walker (specify type) ADL Screening (condition at time of admission) Patient's cognitive ability adequate to safely complete daily activities?: Yes Is the patient deaf or have difficulty hearing?: Yes Does the patient have difficulty seeing, even when wearing glasses/contacts?: No Does the patient have difficulty  concentrating, remembering, or making decisions?: No Patient able to express need for assistance with ADLs?: Yes Does the patient have difficulty dressing or bathing?: No Independently performs ADLs?: No Communication: Independent Dressing (OT): Independent Grooming: Independent Feeding: Independent Bathing: Needs assistance Is this a change from baseline?: Pre-admission baseline Toileting: Independent In/Out Bed: Independent Walks in Home: Independent with device (comment) Does the patient have difficulty walking or climbing stairs?: Yes Weakness of Legs: Both Weakness of Arms/Hands: None  Permission Sought/Granted Permission sought to share information with : Case Manager Permission granted to share information with : Yes, Verbal Permission Granted  Share Information with NAME: Case Manager           Emotional Assessment Appearance:: Appears stated age Attitude/Demeanor/Rapport: Gracious Affect (typically observed): Accepting Orientation: : Oriented to Self, Oriented to Place, Oriented to  Time, Oriented to Situation Alcohol / Substance Use: Not Applicable Psych Involvement: No (comment)  Admission diagnosis:  Dyspnea on exertion [R06.09] Pleural effusion, bilateral [J90] New onset of congestive heart failure (HCC) [I50.9] Heart failure, unspecified HF chronicity, unspecified heart failure type (HCC) [I50.9] CHF exacerbation (HCC) [I50.9] Patient Active Problem List   Diagnosis Date Noted   CHF exacerbation (HCC) 10/31/2022   Heart failure (HCC) 10/30/2022   Dyspnea on exertion 10/30/2022   Pleural effusion, bilateral 10/30/2022   New onset of congestive heart failure (HCC) 10/30/2022   Acute UTI (urinary tract infection) 03/21/2022   URI (upper respiratory infection) 03/21/2022   Hyponatremia 03/21/2022   Hypocalcemia 03/21/2022   Asymptomatic bacteriuria 03/21/2022  Sinus arrhythmia 03/21/2022   Bladder stones 08/17/2021   Presbycusis of both ears 04/12/2021    Postnasal drip 03/01/2017   Abnormal auditory perception of left ear 12/09/2015   Bilateral impacted cerumen 11/26/2015   Ashkenazi Jewish ancestry    OA (osteoarthritis) of knee 01/24/2013   Osteoporosis 06/01/2011   Malignant neoplasm of upper-outer quadrant of right breast in female, estrogen receptor positive (HCC) 04/03/2011   Essential hypertension, benign 12/01/2010   Mixed hyperlipidemia 10/19/2010   Palpitations 10/19/2010   Atony of bladder 10/19/2010   PCP:  Joselyn Arrow, MD Pharmacy:   [id number] - [place], [place] - [address] AT [facility] OF [address] & [facility] [address] [place] [address] Phone: [phone] Fax: [phone]     Social Determinants of Health (SDOH) Social History: SDOH Screenings   Food Insecurity: No Food Insecurity (10/31/2022)  Housing: Low Risk  (10/31/2022)  Transportation Needs: No Transportation Needs (10/31/2022)  Utilities: Not At Risk (10/31/2022)  Depression (PHQ2-9): Low Risk  (07/26/2022)  Tobacco Use: Low Risk  (10/30/2022)   SDOH Interventions:     Readmission Risk Interventions     No data to display

## 2022-10-31 NOTE — Progress Notes (Signed)
Attempted echo @ 10:34am -- pt just moved to chair. Will try again this afternoon.

## 2022-10-31 NOTE — Hospital Course (Addendum)
89 y.o. Ashkenazi [place] female with a known history of hypertension, hyperlipidemia, osteoarthritis, osteoporosis, unspecified palpitations urinary retention with suprapubic catheter presents to the emergency department for evaluation of shortness of breath. Pt was found to be vol overloaded and admitted for management of CHF exacerbation. Cardiology was consulted.  Echocardiogram showed reduced EF of 45%, currently planning GDMT.  No further aggressive workup given her age and comorbidities.     Assessment & Plan:  Principal Problem:   New onset of congestive heart failure (HCC) Active Problems:   Heart failure (HCC)   Dyspnea on exertion   Pleural effusion, bilateral   CHF exacerbation (HCC)   Acute exacerbation of congestive heart failure; ef 45%  Echocardiogram shows EF of 45%, Seen by Cardiology. GDMT, on Toprol XL, losartan, Aldactone and Lasix p.o.  Eventually will transition to [facility] as outpatient.   Bilateral diffuse expiratory wheezing, improved - Combination of cardiac wheezing versus any other etiology.  Patient denies any history of tobacco use/COPD/asthma but could be mild reactive airway disease.  As needed dilators, prednisone 20 mg for 1 more day  Acute kidney injury -Baseline creatinine 0.6, peaked at 1.2, now back to baseline.  Hyponatremia - Secondary to hypovolemia.    Hypertension -Cardiac meds to be adjusted for GDMT   History of hyperlipidemia - Continue atorvastatin   History of osteoporosis - Continue calcium and vitamin D   History of muscle spasms Continue diazepam   History of urinary retention Suprapubic catheter in place.    PT/OT-home health, face-to-face completed   DVT prophylaxis: SCDs Start: 10/31/22 0045 enoxaparin (LOVENOX) injection 40 mg Start: 10/31/22 0044 Code Status: Full Family Communication: Daughter at bedside Status is: Inpatient Remains inpatient appropriate because: Ongoing management for CHF exacerbation, AKI and  hyponatremia

## 2022-10-31 NOTE — Progress Notes (Signed)
Mobility Specialist - Progress Note   10/31/22 1359  Mobility  Activity Ambulated with assistance in hallway  Level of Assistance Minimal assist, patient does 75% or more  Assistive Device Front wheel walker  Distance Ambulated (ft) 100 ft  Range of Motion/Exercises Active  Activity Response Tolerated well  Mobility Referral Yes  $Mobility charge 1 Mobility  Mobility Specialist Start Time (ACUTE ONLY) 1345  Mobility Specialist Stop Time (ACUTE ONLY) 1359  Mobility Specialist Time Calculation (min) (ACUTE ONLY) 14 min   Pt was found on recliner chair and agreeable to ambulate. C/o R groin pain. At EOS returned to recliner chair with all needs met. Call bell in reach.   Billey Chang Mobility Specialist

## 2022-10-31 NOTE — ED Notes (Signed)
Attempted IV with no success, IV team consulted. Floor notified of reason for delay in transport.

## 2022-10-31 NOTE — Consult Note (Signed)
Cardiology Consultation   Patient ID: Mary [name] MRN: [id number]; DOB: 1926-08-11  Admit date: 10/30/2022 Date of Consult: 10/31/2022  PCP:  Joselyn Arrow, MD   [facility] Providers Cardiologist: New to Dr. Mayford Knife   Patient Profile:   Mary [name] is a 89 y.o. female with a hx of HER2 positive breast cancer s/p right mastectomy, hypertension, heart palpitation, hyperlipidemia, osteoporosis, urinary retention with suprapubic catheter,  who is being seen 10/31/2022 for the evaluation of CHF at the request of Dr Rhona Leavens.  History of Present Illness:   Mary [name] was above past medical history presented to the ER on 10/30/2022 night complaining shortness of breath.  Patient states over the past 2-3 weeks he has been increasingly short of breath with exertional activity. Symptoms initially was mild and then progressed.  She reports orthopnea, has difficulty laying flat, has been sleeping on a recliner. She noted lower extremity swelling. She denied any chest pain, dizziness, syncope. She has no previous cardiac diagnosis. She does not know her home medications well. She states she lives alone, has aides coming to assist her, able to ambulate to the bathroom, dress herself, feed herself at baseline. She felt her SOB is slightly improved, walking in the hall today made her feel winded.   Admission diagnostic so far revealed grossly unremarkable CMP, potassium 3.2 today.  BNP 674.  High send troponin 32 >29.  CBC grossly unremarkable.  Chest x-ray from 10/30/2022 revealed small bilateral pleural effusions greater on the right.  She was concern for acute CHF, started on IV Lasix 20 mg daily, admitted to hospitalist.  Cardiology consult is requested today for further evaluation, echocardiogram is pending.  Per chart review, she has no known cardiac disease in the past.  She takes verapamil for high blood pressure and palpitation.    Past Medical History:  Diagnosis Date   Alopecia    Ashkenazi  Jewish ancestry    [facility])    External hemorrhoid    Foley catheter in place    Heart palpitations no cardiologist--  monitored by pcp   per pt "has been on verapamil and lanoxin for years and no palpitations for a very long time"   History of acute bronchitis    dx 12-21-2014  finished [facility]PA[facility]   History of adenomatous polyp of colon    2003   History of basal cell carcinoma excision    2006  left nasolabial fold   History of benign colon tumor    2007  --  HIGH GRADE HYPERPLASTIC ,  S/P  LEFT HEMICOLECTOMY   History of breast cancer ONCOLOGIST-  DR Darnelle Catalan--  antiestogen therapy with femera completed 12/  2012--  no recurrence   dx 10/  2007  right breast Invasive DCIS, grade 2, Stage 2A, pT2  pN0 pMX,  (ER 100% /PR 3% +),  HER +3) with 1 metastatic axill node---  s/p  right mastectomy    History of kidney stones    Hypertension    Hypotonic bladder UROLOGIST-  DR Patsi Sears   Mixed hyperlipidemia    OA (osteoarthritis)    hip   Osteoporosis    DEXA 01/2009; T-3.2 L fem neck, -2.4 L radius   Urinary retention    02-13-2015    Past Surgical History:  Procedure Laterality Date   APPENDECTOMY  age 71   BREAST LUMPECTOMY WITH NEEDLE LOCALIZATION AND AXILLARY SENTINEL LYMPH NODE BX Right 01-23-2006   CARPAL TUNNEL RELEASE Right 04-24-2007   and Pulley  Release index and small finger   CATARACT EXTRACTION W/ INTRAOCULAR LENS  IMPLANT, BILATERAL  left 1991  &  right 1993   COLONOSCOPY  last one 2013   CYSTOSCOPY N/A 07/12/2015   Procedure: CYSTOSCOPY;  Surgeon: Jethro Bolus, MD;  Location: [facility];  Service: Urology;  Laterality: N/A;   CYSTOSCOPY WITH BIOPSY N/A 03/01/2015   Procedure: CYSTOSCOPY WITH TAUBER BIOPSY OF 1CM RIGHT LATERAL BLADDER WALL AND CAUTERIZATION OF BIOPSY SITE ;  Surgeon: Jethro Bolus, MD;  Location: [facility] [place];  Service: Urology;  Laterality: N/A;   CYSTOSCOPY WITH LITHOLAPAXY N/A 09/06/2021   Procedure:  CYSTOSCOPY WITH LITHOLAPAXY < 2cm, HOLMIUM LASER LITHOTRIPSY, BILATERAL RETROGRADE PYELOGRAM, SUPRAPUBIC CATHETER EXCHANGE;  Surgeon: Jerilee Field, MD;  Location: [facility];  Service: Urology;  Laterality: N/A;  ONLY NEEDS 90 MIN   INSERTION OF SUPRAPUBIC CATHETER N/A 07/12/2015   Procedure: INSERTION OF SUPRAPUBIC CATHETER;  Surgeon: Jethro Bolus, MD;  Location: [facility];  Service: Urology;  Laterality: N/A;   LAPAROSCOPIC ASSISTED LEFT HEMICOLECTOMY  06/  2007   high grade hyperplastic mass   MASTECTOMY Right 02-28-2006   PARTIAL KNEE ARTHROPLASTY Right 01/24/2013   Procedure: RIGHT KNEE MEDIAL UNICOMPARTMENTAL ARTHROPLASTY;  Surgeon: Loanne Drilling, MD;  Location: [facility];  Service: Orthopedics;  Laterality: Right;   PULLEY RELEASE LEFT INDEX AND SMALL FINGER  08-07-2007   TONSILLECTOMY  age 39     Home Medications:  Prior to Admission medications   Medication Sig Start Date End Date Taking? Authorizing Provider  acetaminophen (TYLENOL) 500 MG tablet Take 1,000 mg by mouth 3 (three) times daily.    [provider]  albuterol (VENTOLIN HFA) 108 (90 Base) MCG/ACT inhaler Inhale 2 puffs into the lungs every 6 (six) hours as needed for wheezing or shortness of breath. 06/21/22   Joselyn Arrow, MD  atorvastatin (LIPITOR) 40 MG tablet TAKE 1 TABLET(40 MG) BY MOUTH DAILY 09/18/22   Joselyn Arrow, MD  bisacodyl (DULCOLAX) 5 MG EC tablet Take 5 mg by mouth daily as needed for moderate constipation.    [provider]  Calcium Carbonate-Vitamin D (CALCIUM 600+D PO) Take 1 tablet by mouth daily.    [provider]  Cholecalciferol (VITAMIN D3) 50 MCG (2000 UT) TABS Take 2,000 Units by mouth daily. 08/03/20   Magrinat, Valentino Hue, MD  denosumab (PROLIA) 60 MG/ML SOLN injection Inject 60 mg into the skin every 6 (six) months. Administer in upper arm, thigh, or abdomen 10/24/12   [provider]  diazepam (VALIUM) 2 MG tablet TAKE 1 TABLET(2 MG) BY MOUTH EVERY 8  HOURS AS NEEDED FOR MUSCLE SPASMS 09/27/22   Joselyn Arrow, MD  fluticasone (CUTIVATE) 0.005 % ointment Apply 1 Application topically 2 (two) times daily.    [provider]  hydrocortisone (ANUSOL-HC) 2.5 % rectal cream Place 1 application. rectally 2 (two) times daily. 07/18/21   Joselyn Arrow, MD  loratadine (CLARITIN) 10 MG tablet Take 10 mg by mouth daily as needed for allergies.    [provider]  Multiple Vitamins-Minerals (PRESERVISION AREDS 2) CAPS Take 1 capsule by mouth 2 (two) times daily.    [provider]  polyethylene glycol (MIRALAX / GLYCOLAX) packet Take 17 g by mouth daily as needed for moderate constipation.    [provider]  Polyvinyl Alcohol-Povidone (REFRESH OP) Place 1 drop into both eyes 2 (two) times daily.    [provider]  triamcinolone cream (KENALOG) 0.1 % Apply 1  Application topically 2 (two) times daily. 05/10/22   Joselyn Arrow, MD  verapamil (VERELAN) 360 MG 24 hr capsule TAKE 1 CAPSULE(360 MG) BY MOUTH AT BEDTIME 08/30/22   Joselyn Arrow, MD    Inpatient Medications: Scheduled Meds:  atorvastatin  40 mg Oral Daily   calcium-vitamin D  1 tablet Oral Q breakfast   carvedilol  6.25 mg Oral BID WC   cholecalciferol  2,000 Units Oral Daily   enoxaparin (LOVENOX) injection  40 mg Subcutaneous Q24H   [START ON 11/01/2022] furosemide  40 mg Intravenous Daily   lisinopril  2.5 mg Oral Daily   multivitamin  1 tablet Oral Daily   potassium chloride  40 mEq Oral Q4H   sodium chloride flush  3 mL Intravenous Q12H   verapamil  360 mg Oral Daily   Continuous Infusions:  sodium chloride     PRN Meds: sodium chloride, acetaminophen, diazepam, ipratropium-albuterol, loratadine, ondansetron (ZOFRAN) IV, mouth rinse, oxyCODONE, sodium chloride flush  Allergies:    Allergies  Allergen Reactions   Arimidex [Anastrozole] Rash   Avelox [Moxifloxacin Hcl In Nacl] Nausea Only and Rash   Azithromycin Nausea Only and Rash   Bactrim Nausea  Only and Rash   Biaxin [Clarithromycin] Nausea Only and Rash   Nitrofurantoin Nausea Only, Rash and Other (See Comments)   Penicillins Nausea Only and Rash   Sulfa Antibiotics Nausea And Vomiting and Rash   Sulfasalazine Rash and Other (See Comments)    GI intolerance    Sulfur Nausea And Vomiting and Rash    Social History:   Social History   Socioeconomic History   Marital status: Widowed    Spouse name: Not on file   Number of children: 3   Years of education: Not on file   Highest education level: Not on file  Occupational History   Not on file  Tobacco Use   Smoking status: Never   Smokeless tobacco: Never  Vaping Use   Vaping status: Never Used  Substance and Sexual Activity   Alcohol use: Never   Drug use: No   Sexual activity: Not Currently    Birth control/protection: Post-menopausal  Other Topics Concern   Not on file  Social History Narrative   Lives alone, no pets.   Children live in [place] and [place]. She has 1 son and 2 daughters; nephew lives in [place].   Widowed 11/25/92   Her brother passed away (had moved to [place] same time she did)      She no longer drives.   Has caregiver Zella Ball during the week (mornings, sometimes until 4), somebody on Saturday and 11/26/2022 evenings (sometimes).      Updated 07/2026   Social Determinants of Health   Financial Resource Strain: Not on file  Food Insecurity: No Food Insecurity (10/31/2022)   Hunger Vital Sign    Worried About Running Out of Food in the Last Year: Never true    Ran Out of Food in the Last Year: Never true  Transportation Needs: No Transportation Needs (10/31/2022)   PRAPARE - Administrator, Civil Service (Medical): No    Lack of Transportation (Non-Medical): No  Physical Activity: Not on file  Stress: Not on file  Social Connections: Not on file  Intimate Partner Violence: Not At Risk (10/31/2022)   Humiliation, Afraid, Rape, and Kick questionnaire    Fear of Current or Ex-Partner: No     Emotionally Abused: No    Physically Abused: No    Sexually  Abused: No    Family History:    Family History  Problem Relation Age of Onset   Hypertension Mother    Stroke Father    Diabetes Father    Hyperlipidemia Brother    Atrial fibrillation Brother    Hyperlipidemia Son    Breast cancer Paternal Aunt        dx 50s; deceased early 60s   Breast cancer Cousin        paternal first cousin   Colon cancer Cousin        paternal first cousin; age dx 36s     ROS:  Constitutional: Denied fever, chills, malaise, night sweats Eyes: Denied vision change or loss Ears/Nose/Mouth/Throat: Denied ear ache, sore throat, coughing, sinus pain Cardiovascular: denied chest pain/pressure Respiratory:see HPI  Gastrointestinal: Denied nausea, vomiting, abdominal pain, diarrhea Genital/Urinary: Denied dysuria, hematuria, urinary frequency/urgency Musculoskeletal: Denied muscle ache, joint pain, weakness Skin: Denied rash, wound Neuro: Denied headache, dizziness, syncope Psych: Denied history of depression/anxiety  Endocrine: Denied history of diabetes   Physical Exam/Data:   Vitals:   10/31/22 0100 10/31/22 0456 10/31/22 1022 10/31/22 1227  BP:  (!) 146/117 (!) 140/99 (!) 139/92  Pulse:  98  81  Resp:  13  (!) 22  Temp:  98.2 F (36.8 C)  97.6 F (36.4 C)  TempSrc:  Oral  Oral  SpO2:  93%  98%  Weight: 55.7 kg     Height: 5\' 2"  (1.575 m)       Intake/Output Summary (Last 24 hours) at 10/31/2022 1431 Last data filed at 10/31/2022 0700 Gross per 24 hour  Intake 0 ml  Output 1200 ml  Net -1200 ml      10/31/2022    1:00 AM 10/26/2022    5:55 PM 08/07/2022   11:23 AM  Last 3 Weights  Weight (lbs) 122 lb 12.7 oz 121 lb 0.5 oz 121 lb  Weight (kg) 55.7 kg 54.9 kg 54.885 kg     Body mass index is 22.46 kg/m.   Vitals:  Vitals:   10/31/22 1022 10/31/22 1227  BP: (!) 140/99 (!) 139/92  Pulse:  81  Resp:  (!) 22  Temp:  97.6 F (36.4 C)  SpO2:  98%   General Appearance: In  no apparent distress, sitting in chair  HEENT: Normocephalic, atraumatic.  Neck: Supple, trachea midline, JVD elevated to upper neck  Cardiovascular: Regular rate and rhythm, skipped heart beat,  normal S1-S2,  soft systolic murmur  Respiratory: Resting breathing unlabored, dyspnea with walking, lungs sounds with crackles at base, no use of accessory muscles. On room air.   Gastrointestinal: Bowel sounds positive, abdomen soft Extremities: Able to move all extremities in bed without difficulty, BLE 1+ edema  Genitourinary: suprapubic catheter in place, urine yellow  Musculoskeletal: Mild muscular atrophy  Skin: Intact, warm, dry. No rashes or petechiae noted in exposed areas.  Neurologic: Alert, oriented to person, place and time. no cognitive deficit, no gross focal neuro deficit Psychiatric: Normal affect. Mood is appropriate.    EKG:  The EKG was personally reviewed and demonstrates:    EKG from 10/30/22 showed sinus rhythm, 94bpm,  RBBB, PACs, TWI of V1  Telemetry:  Telemetry was personally reviewed and demonstrates:    Sinus rhythm, frequent PACs and PVCs, runs of SVT noted   Relevant CV Studies:  No previous cardiac workup   Laboratory Data:  High Sensitivity Troponin:   Recent Labs  Lab 10/26/22 1835 10/30/22 2040 10/31/22 0043  TROPONINIHS 37* 32*  29*     Chemistry Recent Labs  Lab 10/26/22 1835 10/30/22 2040 10/31/22 0512  NA 137 133* 137  K 3.9 3.8 3.2*  CL 99 98 101  CO2 31 26 25   GLUCOSE 107* 124* 96  BUN 19 22 17   CREATININE 0.63 0.81 0.65  CALCIUM 9.4 8.9 8.5*  GFRNONAA >60 >60 >60  ANIONGAP 7 9 11     Recent Labs  Lab 10/26/22 1835 10/31/22 0512  PROT 6.6 5.8*  ALBUMIN 3.9 3.3*  AST 20 21  ALT 24 27  ALKPHOS 87 83  BILITOT 0.5 0.7   Lipids No results for input(s): "CHOL", "TRIG", "HDL", "LABVLDL", "LDLCALC", "CHOLHDL" in the last 168 hours.  Hematology Recent Labs  Lab 10/26/22 1835 10/30/22 2040 10/31/22 0512  WBC 6.2 6.8 5.9  RBC  4.42 4.64 4.21  HGB 12.7 13.1 12.1  HCT 39.2 41.6 38.2  MCV 88.7 89.7 90.7  MCH 28.7 28.2 28.7  MCHC 32.4 31.5 31.7  RDW 15.7* 15.7* 15.6*  PLT 235 240 226   Thyroid No results for input(s): "TSH", "FREET4" in the last 168 hours.  BNP Recent Labs  Lab 10/26/22 1835 10/30/22 2040  BNP 539.2* 674.3*    DDimer No results for input(s): "DDIMER" in the last 168 hours.   Radiology/Studies:  DG Chest 2 View  Result Date: 10/30/2022 CLINICAL DATA:  Shortness of breath with talking and ambulating, worse over the past 2 weeks. EXAM: CHEST - 2 VIEW COMPARISON:  10/26/2022 FINDINGS: Mild cardiac enlargement. No vascular congestion, edema, or consolidation. Small bilateral pleural effusions, greater on the right, similar to prior study. No pneumothorax. Mediastinal contours appear intact. Degenerative changes in the spine and shoulders. IMPRESSION: Small bilateral pleural effusions, greater on the right. Similar appearance to prior study. Electronically Signed   By: Burman Nieves M.D.   On: 10/30/2022 20:17     Assessment and Plan:   Acute heart failure with unknown EF - presented with exertional SOB, orthopnea, leg edema for 2-3 weeks - BNP 674 - Hs trop 32 >29, not consistent with ACS  - CXR with pleural effusions - clinically hypervolemic, UOP 1200 + counts, will increase IV Lasix from 20 to 40mg  daily, please tract accurate I&O, daily weight, electrolytes  - Echo pending - discussed lifestyle change with Na reduction, CHF symptoms to watch for  - GDMT: continue current regimen with coreg, lisinopril, verapamil for HTN, further change pending Echo   Paroxysmal SVT Frequent PVC and PAC RBBB Heart palpitation - noted on telemetry/EKG - keep Mag >2 and K >4 - will increase Coreg from 3.125 to 6.25mg  BID, continue verapamil unless EF is down  - may discharge with a 2 week Zio to quantify PVC and SVT burden   Elevated troponin - Hs trop 32 >29, demand from CHF - Echo pending -  no ischemic evaluation indicated at this time   HTN - BP elevated, increased coreg as above, continue lisinopril and verapamil      Risk Assessment/Risk Scores:   New York Heart Association (NYHA) Functional Class NYHA Class III        For questions or updates, please contact [facility] Please consult [email or web address] for contact info under    Signed, Cyndi Bender, NP  10/31/2022 2:31 PM

## 2022-10-31 NOTE — Progress Notes (Signed)
  Progress Note   Patient: Mary [name]:[id number] DOB: 12-Aug-1926 DOA: 10/30/2022     0 DOS: the patient was seen and examined on 10/31/2022   Brief hospital course: 88 y.o. Ashkenazi [place] female with a known history of hypertension, hyperlipidemia, osteoarthritis, osteoporosis, unspecified palpitations urinary retention with suprapubic catheter presents to the emergency department for evaluation of shortness of breath.  Patient was in a usual state of health until 2 weeks ago when she reports the onset of progressively worsening shortness of breath, dyspnea with exertion, orthopnea and lower extremity edema.  Patient symptoms are inhibiting her activities of daily living, she is normally independent. Pt was found to be vol overloaded and admitted for management of CHF exacerbation. Cardiology was consulted  Assessment and Plan: #. Acute exacerbation of congestive heart failure - Presenting BNP elevated at 674.3 -Clinically improved with 20mg  IV lasix overnight -Cardiology consulted. Recs to increase lasix to 40mg  daily. Cont coreg 6.25mg  bid, lisinopril 2.5mg  and verapamil 360mg  - 2d echo performed, pending results. Per Cardiology, if EF is reduced, then would stop verapamil and titrate up coreg - recheck bmet in AM    #.  History of hypertension - Continue verapamil, coreg, diuretic per above   #.  History of hyperlipidemia - Continue atorvastatin as tolerated   #. History of osteoporosis - Continue calcium and vitamin D   #. History of muscle spasms - Continue diazepam   #. History of urinary retention -Routine suprapubic catheter care -seems stable at this time   Subjective: Reports feeling better after IV lasix overnight. Asking about going home soon.  Physical Exam: Vitals:   10/31/22 0100 10/31/22 0456 10/31/22 1022 10/31/22 1227  BP:  (!) 146/117 (!) 140/99 (!) 139/92  Pulse:  98  81  Resp:  13  (!) 22  Temp:  98.2 F (36.8 C)  97.6 F (36.4 C)  TempSrc:  Oral   Oral  SpO2:  93%  98%  Weight: 55.7 kg     Height: 5\' 2"  (1.575 m)      General exam: Awake, laying in bed, in nad Respiratory system: Normal respiratory effort, no wheezing Cardiovascular system: regular rate, s1, s2 Gastrointestinal system: Soft, nondistended, positive BS Central nervous system: CN2-12 grossly intact, strength intact Extremities: Perfused, no clubbing Skin: Normal skin turgor, no notable skin lesions seen Psychiatry: Mood normal // no visual hallucinations   Data Reviewed:  Labs reviewed: Na 137, K 3.2, Cr 0.65, WBC 5.9, Hgb 12.1  Family Communication: Pt in room, family not at bedside  Disposition: Status is: Observation The patient will require care spanning > 2 midnights and should be moved to inpatient because: Severity of illness  Planned Discharge Destination: Home    Author: Rickey Barbara, MD 10/31/2022 4:09 PM  For on call review [email or web address].

## 2022-10-31 NOTE — ED Notes (Signed)
Two unsuccessful IV attempts per [facility] RN

## 2022-11-01 DIAGNOSIS — I5043 Acute on chronic combined systolic (congestive) and diastolic (congestive) heart failure: Secondary | ICD-10-CM

## 2022-11-01 DIAGNOSIS — I420 Dilated cardiomyopathy: Secondary | ICD-10-CM

## 2022-11-01 DIAGNOSIS — I4719 Other supraventricular tachycardia: Secondary | ICD-10-CM | POA: Diagnosis not present

## 2022-11-01 DIAGNOSIS — I5023 Acute on chronic systolic (congestive) heart failure: Secondary | ICD-10-CM

## 2022-11-01 DIAGNOSIS — I509 Heart failure, unspecified: Secondary | ICD-10-CM | POA: Diagnosis not present

## 2022-11-01 DIAGNOSIS — R0609 Other forms of dyspnea: Secondary | ICD-10-CM | POA: Diagnosis not present

## 2022-11-01 MED ORDER — GUAIFENESIN 100 MG/5ML PO LIQD: 5.0000 mL | ORAL | Status: DC | PRN

## 2022-11-01 MED ORDER — METOPROLOL TARTRATE 25 MG PO TABS
25.0000 mg | ORAL_TABLET | Freq: Two times a day (BID) | ORAL | Status: DC
  Administered 2022-11-01 – 2022-11-02 (×3): 25 mg via ORAL
  Filled 2022-11-01 (×3): qty 1

## 2022-11-01 MED ORDER — LOSARTAN POTASSIUM 25 MG PO TABS: 25.0000 mg | ORAL_TABLET | Freq: Every day | ORAL | Status: DC

## 2022-11-01 MED ORDER — IPRATROPIUM-ALBUTEROL 0.5-2.5 (3) MG/3ML IN SOLN
3.0000 mL | Freq: Two times a day (BID) | RESPIRATORY_TRACT | Status: DC
Start: 2022-11-01 — End: 2022-11-01

## 2022-11-01 MED ORDER — TRAZODONE HCL 50 MG PO TABS
50.0000 mg | ORAL_TABLET | Freq: Every evening | ORAL | Status: DC | PRN
  Administered 2022-11-02: 50 mg via ORAL
  Filled 2022-11-01: qty 1

## 2022-11-01 MED ORDER — HYDRALAZINE HCL 20 MG/ML IJ SOLN: 10.0000 mg | INTRAMUSCULAR | Status: DC | PRN

## 2022-11-01 MED ORDER — METHYLPREDNISOLONE SODIUM SUCC 40 MG IJ SOLR
40.0000 mg | Freq: Two times a day (BID) | INTRAMUSCULAR | Status: DC
  Administered 2022-11-01 (×2): 40 mg via INTRAVENOUS
  Filled 2022-11-01 (×2): qty 1

## 2022-11-01 MED ORDER — SENNOSIDES-DOCUSATE SODIUM 8.6-50 MG PO TABS
1.0000 | ORAL_TABLET | Freq: Every evening | ORAL | Status: DC | PRN
  Administered 2022-11-01 – 2022-11-03 (×3): 1 via ORAL
  Filled 2022-11-01 (×3): qty 1

## 2022-11-01 MED ORDER — SACUBITRIL-VALSARTAN 24-26 MG PO TABS: 1.0000 | ORAL_TABLET | Freq: Two times a day (BID) | ORAL | Status: DC

## 2022-11-01 MED ORDER — IPRATROPIUM-ALBUTEROL 0.5-2.5 (3) MG/3ML IN SOLN
3.0000 mL | Freq: Two times a day (BID) | RESPIRATORY_TRACT | Status: DC
  Administered 2022-11-01: 3 mL via RESPIRATORY_TRACT
  Filled 2022-11-01: qty 3

## 2022-11-01 MED ORDER — IPRATROPIUM-ALBUTEROL 0.5-2.5 (3) MG/3ML IN SOLN
3.0000 mL | Freq: Two times a day (BID) | RESPIRATORY_TRACT | Status: DC
  Administered 2022-11-01: 3 mL via RESPIRATORY_TRACT
  Filled 2022-11-01 (×2): qty 3

## 2022-11-01 MED ORDER — METOPROLOL TARTRATE 5 MG/5ML IV SOLN: 5.0000 mg | INTRAVENOUS | Status: DC | PRN

## 2022-11-01 MED ORDER — IPRATROPIUM-ALBUTEROL 0.5-2.5 (3) MG/3ML IN SOLN: 3.0000 mL | RESPIRATORY_TRACT | Status: DC | PRN

## 2022-11-01 MED ORDER — FUROSEMIDE 10 MG/ML IJ SOLN
40.0000 mg | Freq: Two times a day (BID) | INTRAMUSCULAR | Status: DC
  Administered 2022-11-01: 40 mg via INTRAVENOUS
  Filled 2022-11-01: qty 4

## 2022-11-01 MED ORDER — CARVEDILOL 12.5 MG PO TABS: 12.5000 mg | ORAL_TABLET | Freq: Two times a day (BID) | ORAL | Status: DC

## 2022-11-01 NOTE — Progress Notes (Signed)
   Patient Name: Mary [name] Date of Encounter: 11/01/2022 [facility] Cardiologist: Armanda Magic, MD -- New this admission   Interval Summary  .    Patient reports that breathing feels a bit worse today than yesterday. Got short of breath when transitioning from bed to chair. No chest pain, palpitations   Vital Signs .    Vitals:   10/31/22 1945 11/01/22 0343 11/01/22 0500 11/01/22 0938  BP: (!) 142/84 (!) 156/112  (!) 152/98  Pulse: 79 90  80  Resp: 14 15    Temp: (!) 97.4 F (36.3 C) 98.1 F (36.7 C)    TempSrc: Oral Oral    SpO2: 96% 95%    Weight:   57.7 kg   Height:        Intake/Output Summary (Last 24 hours) at 11/01/2022 1133 Last data filed at 11/01/2022 1101 Gross per 24 hour  Intake 635 ml  Output 1600 ml  Net -965 ml      11/01/2022    5:00 AM 10/31/2022    1:00 AM 10/26/2022    5:55 PM  Last 3 Weights  Weight (lbs) 127 lb 3.3 oz 122 lb 12.7 oz 121 lb 0.5 oz  Weight (kg) 57.7 kg 55.7 kg 54.9 kg      Telemetry/ECG    Sinus rhythm with PVCs, PACs - Personally Reviewed  Physical Exam .   GEN: Thin, elderly female. Sitting in the armchair in no acute distress Neck: No JVD Cardiac: RRR, no murmurs, rubs, or gallops.  Respiratory: Crackles in lung bases, expiratory wheezes throughout  GI: Soft, nontender, non-distended  MS: No edema in BLE   Assessment & Plan .     Acute on Chronic HFmrEF  Elevated Troponin  - Patient presented complaining of exertional shortness of breath, orthopnea, leg edema  - BNP elevated to 674 on presentation  - hsTn 32>29- not consistent with ACS. Likely demand ischemia due to CHF exacerbation - CXR on admission showed small bilateral pleural effusions  - On IV lasix 40 mg daily- I/Os incomplete. Renal function stable  - Continues to have shortness of breath on exertion and crackles in lung bases- continue IV lasix today  - Also had wheezing on exam - nebulizer treatments ordered by primary team  - Echocardiogram  this admission showed EF 45%, no regional wall motion abnormalities, normal RV function  - Consider nuclear stress test as an outpatient. However, given Patient's advanced age and lack of chest pain, favor conservative approach with GDMT alone  - Stopping verapamil. Increase carvedilol to 12.5 mg BID  - Transition from lisinopril 2.5 mg daily to losartan 25 mg daily   Paroxysmal SVT Frequent PVCs and PACs  - Given reduced EF on echo, stop verapamil  - Increase carvedilol to 12.5 mg BID  - Maintain K>4, mag >2   HTN - Increase carvedilol to 12.5 mg BID  - Transition from lisinopril to losartan 25 mg daily as above    For questions or updates, please contact [facility] Please consult [email or web address] for contact info under        Signed, Jonita Albee, PA-C

## 2022-11-01 NOTE — Progress Notes (Signed)
PT Cancellation Note  Patient Details Name: Mary [name] MRN: [id number] DOB: 02/03/1927   Cancelled Treatment:    Reason Eval/Treat Not Completed:  Attempted PT eval-medical staff and nursing in room. Will check back as schedule allows.    Faye Ramsay, PT Acute Rehabilitation  Office: [phone]

## 2022-11-01 NOTE — Progress Notes (Signed)
PROGRESS NOTE    Dura Douthat  LOV:[id number] DOB: 1927-02-26 DOA: 10/30/2022 PCP: Joselyn Arrow, MD   Brief Narrative:  89 y.o. Ashkenazi [place] female with a known history of hypertension, hyperlipidemia, osteoarthritis, osteoporosis, unspecified palpitations urinary retention with suprapubic catheter presents to the emergency department for evaluation of shortness of breath. Pt was found to be vol overloaded and admitted for management of CHF exacerbation. Cardiology was consulted    Assessment & Plan:  Principal Problem:   New onset of congestive heart failure (HCC) Active Problems:   Heart failure (HCC)   Dyspnea on exertion   Pleural effusion, bilateral   CHF exacerbation (HCC)   Acute exacerbation of congestive heart failure -Patient found to have elevated BNP.  Currently getting IV Lasix, monitor urine output, fluid restriction, check and replete electrolytes as necessary.  Cardiology is following - Echocardiogram EF 45%, planned GDMT.   Bilateral diffuse expiratory wheezing - Combination of cardiac wheezing versus any other etiology.  Patient denies any history of tobacco use/COPD/asthma but could be mild reactive airway disease.  Will start Solu-Medrol, bronchodilators and patient is already getting Lasix.  Will continue to monitor.    Hypertension -Cardiac meds to be adjusted for GDMT.    History of hyperlipidemia - Continue atorvastatin   History of osteoporosis - Continue calcium and vitamin D   History of muscle spasms Continue diazepam   History of urinary retention Suprapubic catheter in place.     DVT prophylaxis: SCDs Start: 10/31/22 0045 enoxaparin (LOVENOX) injection 40 mg Start: 10/31/22 0044 Code Status: Full Family Communication: Daughter at bedside Status is: Inpatient Remains inpatient appropriate because: On going eval for CHF       Diet Orders (From admission, onward)     Start     Ordered   10/31/22 0045  Diet Heart Room service  appropriate? Yes; Fluid consistency: Thin  Diet effective now       Question Answer Comment  Room service appropriate? Yes   Fluid consistency: Thin      10/31/22 0044            Subjective: Patient sitting up in the recliner, admitting of exertional dyspnea.  Denies any chest pain I discussed echocardiogram results with the patient and family.  We did discuss given her advanced age I would recommend against any aggressive diagnostic testing such as cardiac catheterization.  Patient and family in agreement.   Examination:  General exam: Appears calm and comfortable, elderly frail Respiratory system: Diffuse bilateral expiratory wheezing Cardiovascular system: S1 & S2 heard, RRR. No JVD, murmurs, rubs, gallops or clicks. No pedal edema. Gastrointestinal system: Abdomen is nondistended, soft and nontender. No organomegaly or masses felt. Normal bowel sounds heard. Central nervous system: Alert and oriented. No focal neurological deficits. Extremities: Symmetric 5 x 5 power. Skin: No rashes, lesions or ulcers Psychiatry: Judgement and insight appear normal. Mood & affect appropriate.  Objective: Vitals:   10/31/22 1227 10/31/22 1945 11/01/22 0343 11/01/22 0500  BP: (!) 139/92 (!) 142/84 (!) 156/112   Pulse: 81 79 90   Resp: (!) 22 14 15    Temp: 97.6 F (36.4 C) (!) 97.4 F (36.3 C) 98.1 F (36.7 C)   TempSrc: Oral Oral Oral   SpO2: 98% 96% 95%   Weight:    57.7 kg  Height:        Intake/Output Summary (Last 24 hours) at 11/01/2022 0846 Last data filed at 11/01/2022 0600 Gross per 24 hour  Intake 630 ml  Output 750  ml  Net -120 ml   Filed Weights   10/31/22 0100 11/01/22 0500  Weight: 55.7 kg 57.7 kg    Scheduled Meds:  atorvastatin  40 mg Oral Daily   calcium-vitamin D  1 tablet Oral Q breakfast   carvedilol  6.25 mg Oral BID WC   cholecalciferol  2,000 Units Oral Daily   enoxaparin (LOVENOX) injection  40 mg Subcutaneous Q24H   furosemide  40 mg Intravenous  Daily   lisinopril  2.5 mg Oral Daily   multivitamin  1 tablet Oral Daily   sodium chloride flush  3 mL Intravenous Q12H   verapamil  360 mg Oral Daily   Continuous Infusions:  sodium chloride      Nutritional status     Body mass index is 23.27 kg/m.  Data Reviewed:   CBC: Recent Labs  Lab 10/26/22 1835 10/30/22 2040 10/31/22 0512  WBC 6.2 6.8 5.9  NEUTROABS 4.4 4.7  --   HGB 12.7 13.1 12.1  HCT 39.2 41.6 38.2  MCV 88.7 89.7 90.7  PLT 235 240 226   Basic Metabolic Panel: Recent Labs  Lab 10/26/22 1835 10/30/22 2040 10/31/22 0512 11/01/22 0457  NA 137 133* 137 136  K 3.9 3.8 3.2* 4.4  CL 99 98 101 101  CO2 31 26 25 23   GLUCOSE 107* 124* 96 98  BUN 19 22 17 18   CREATININE 0.63 0.81 0.65 0.61  CALCIUM 9.4 8.9 8.5* 8.8*   GFR: Estimated Creatinine Clearance: 34.7 mL/min (by C-G formula based on SCr of 0.61 mg/dL). Liver Function Tests: Recent Labs  Lab 10/26/22 1835 10/31/22 0512 11/01/22 0457  AST 20 21 18   ALT 24 27 24   ALKPHOS 87 83 73  BILITOT 0.5 0.7 0.6  PROT 6.6 5.8* 5.8*  ALBUMIN 3.9 3.3* 3.1*   No results for input(s): "LIPASE", "AMYLASE" in the last 168 hours. No results for input(s): "AMMONIA" in the last 168 hours. Coagulation Profile: No results for input(s): "INR", "PROTIME" in the last 168 hours. Cardiac Enzymes: No results for input(s): "CKTOTAL", "CKMB", "CKMBINDEX", "TROPONINI" in the last 168 hours. BNP (last 3 results) No results for input(s): "PROBNP" in the last 8760 hours. HbA1C: No results for input(s): "HGBA1C" in the last 72 hours. CBG: No results for input(s): "GLUCAP" in the last 168 hours. Lipid Profile: No results for input(s): "CHOL", "HDL", "LDLCALC", "TRIG", "CHOLHDL", "LDLDIRECT" in the last 72 hours. Thyroid Function Tests: No results for input(s): "TSH", "T4TOTAL", "FREET4", "T3FREE", "THYROIDAB" in the last 72 hours. Anemia Panel: No results for input(s): "VITAMINB12", "FOLATE", "FERRITIN", "TIBC",  "IRON", "RETICCTPCT" in the last 72 hours. Sepsis Labs: No results for input(s): "PROCALCITON", "LATICACIDVEN" in the last 168 hours.  No results found for this or any previous visit (from the past 240 hour(s)).       Radiology Studies: ECHOCARDIOGRAM COMPLETE  Result Date: 10/31/2022    ECHOCARDIOGRAM REPORT   Patient Name:   BRANDIN ROSA Date of Exam: 10/31/2022 Medical Rec #:  [id number]   Height:       62.0 in Accession #:    [id number]  Weight:       122.8 lb Date of Birth:  [birth year]  BSA:          1.554 m Patient Age:    89 years    BP:           139/92 mmHg Patient Gender: F           HR:  60 bpm. Exam Location:  Inpatient Procedure: 2D Echo, Cardiac Doppler and Color Doppler Indications:    Congestive Heart Failure  History:        Patient has no prior history of Echocardiogram examinations.                 CHF, Signs/Symptoms:Dyspnea, Shortness of Breath and Edema; Risk                 Factors:Dyslipidemia and Hypertension. Hx cancer.  Sonographer:    Wallie Char Referring Phys: [id number] ALEXIS HUGELMEYER  Sonographer Comments: Image acquisition challenging due to uncooperative patient. Limited interrogation of valves due to pt inability to tolerate exam and being touched IMPRESSIONS  1. Left ventricular ejection fraction, by estimation, is 45%. The left ventricle has mildly decreased function. The left ventricle has no regional wall motion abnormalities. Left ventricular diastolic parameters are indeterminate.  2. Right ventricular systolic function is normal. The right ventricular size is moderately enlarged.  3. Left atrial size was severely dilated.  4. The mitral valve is degenerative. No evidence of mitral valve regurgitation. Severe mitral annular calcification.  5. The aortic valve was not well visualized. Aortic valve regurgitation is not visualized.  6. The inferior vena cava is dilated in size with <50% respiratory variability, suggesting right atrial pressure of 15  mmHg.Full valve assessment limited by patients discomfort. Comparison(s): No prior Echocardiogram. FINDINGS  Left Ventricle: Left ventricular ejection fraction, by estimation, is 45%. The left ventricle has mildly decreased function. The left ventricle has no regional wall motion abnormalities. The left ventricular internal cavity size was normal in size. There is no left ventricular hypertrophy. Left ventricular diastolic parameters are indeterminate. Right Ventricle: The right ventricular size is moderately enlarged. No increase in right ventricular wall thickness. Right ventricular systolic function is normal. Left Atrium: Left atrial size was severely dilated. Right Atrium: Right atrial size was normal in size. Pericardium: There is no evidence of pericardial effusion. Mitral Valve: The mitral valve is degenerative in appearance. Severe mitral annular calcification. No evidence of mitral valve regurgitation. MV peak gradient, 4.2 mmHg. The mean mitral valve gradient is 1.0 mmHg. Tricuspid Valve: The tricuspid valve is normal in structure. Tricuspid valve regurgitation is mild . No evidence of tricuspid stenosis. Aortic Valve: The aortic valve was not well visualized. Aortic valve regurgitation is not visualized. Aortic valve mean gradient measures 3.0 mmHg. Aortic valve peak gradient measures 4.9 mmHg. Aortic valve area, by VTI measures 2.25 cm. Pulmonic Valve: The pulmonic valve was not well visualized. Pulmonic valve regurgitation is not visualized. Aorta: The aortic root and ascending aorta are structurally normal, with no evidence of dilitation. Venous: The inferior vena cava is dilated in size with less than 50% respiratory variability, suggesting right atrial pressure of 15 mmHg. IAS/Shunts: The interatrial septum was not well visualized.  LEFT VENTRICLE PLAX 2D LVIDd:         4.80 cm     Diastology LVIDs:         3.60 cm     LV e' medial:    4.79 cm/s LV PW:         1.00 cm     LV E/e' medial:  21.7 LV  IVS:        0.90 cm     LV e' lateral:   5.89 cm/s LVOT diam:     1.90 cm     LV E/e' lateral: 17.7 LV SV:         48  LV SV Index:   31 LVOT Area:     2.84 cm  LV Volumes (MOD) LV vol d, MOD A2C: 72.3 ml LV vol d, MOD A4C: 68.5 ml LV vol s, MOD A2C: 42.9 ml LV vol s, MOD A4C: 39.6 ml LV SV MOD A2C:     29.4 ml LV SV MOD A4C:     68.5 ml LV SV MOD BP:      29.8 ml RIGHT VENTRICLE            IVC RV Basal diam:  3.60 cm    IVC diam: 3.10 cm RV S prime:     7.34 cm/s TAPSE (M-mode): 1.4 cm LEFT ATRIUM             Index        RIGHT ATRIUM           Index LA diam:        3.70 cm 2.38 cm/m   RA Area:     14.10 cm LA Vol (A2C):   85.8 ml 55.22 ml/m  RA Volume:   37.30 ml  24.01 ml/m LA Vol (A4C):   81.5 ml 52.45 ml/m LA Biplane Vol: 85.2 ml 54.83 ml/m  AORTIC VALVE AV Area (Vmax):    1.99 cm AV Area (Vmean):   2.02 cm AV Area (VTI):     2.25 cm AV Vmax:           110.50 cm/s AV Vmean:          77.550 cm/s AV VTI:            0.213 m AV Peak Grad:      4.9 mmHg AV Mean Grad:      3.0 mmHg LVOT Vmax:         77.40 cm/s LVOT Vmean:        55.200 cm/s LVOT VTI:          0.169 m LVOT/AV VTI ratio: 0.79  AORTA Ao Root diam: 2.90 cm Ao Asc diam:  2.70 cm MITRAL VALVE                TRICUSPID VALVE MV Area (PHT): 5.54 cm     TR Peak grad:   25.0 mmHg MV Area VTI:   1.76 cm     TR Vmax:        250.00 cm/s MV Peak grad:  4.2 mmHg MV Mean grad:  1.0 mmHg     SHUNTS MV Vmax:       1.02 m/s     Systemic VTI:  0.17 m MV Vmean:      54.1 cm/s    Systemic Diam: 1.90 cm MV Decel Time: 137 msec MV E velocity: 104.00 cm/s MV A velocity: 78.70 cm/s MV E/A ratio:  1.32 Riley Lam MD Electronically signed by Riley Lam MD Signature Date/Time: 10/31/2022/4:21:41 PM    Final    DG Chest 2 View  Result Date: 10/30/2022 CLINICAL DATA:  Shortness of breath with talking and ambulating, worse over the past 2 weeks. EXAM: CHEST - 2 VIEW COMPARISON:  10/26/2022 FINDINGS: Mild cardiac enlargement. No vascular congestion,  edema, or consolidation. Small bilateral pleural effusions, greater on the right, similar to prior study. No pneumothorax. Mediastinal contours appear intact. Degenerative changes in the spine and shoulders. IMPRESSION: Small bilateral pleural effusions, greater on the right. Similar appearance to prior study. Electronically Signed   By: Burman Nieves M.D.   On: 10/30/2022 20:17  LOS: 1 day   Time spent= 35 mins     Joline Maxcy, MD [facility]  If 7PM-7AM, please contact night-coverage  11/01/2022, 8:46 AM

## 2022-11-01 NOTE — TOC Progression Note (Addendum)
Transition of Care [facility]) - Progression Note    Patient Details  Name: Mary [name] MRN: [id number] Date of Birth: 1927/01/22  [facility] CM/SW Contact  , Olegario Messier, RN Phone Number: 11/01/2022, 4:33 PM  Clinical Narrative: Sherron Monday to [name] tr about d/c plans-recc HHPT, recc HHRN-instruction disease mgmnt,meds,care-supra pubic cath;bag.Await OT recc.Will provide HHC agency await choice-leave list in rm. Has own transport home.Has private duty care-[facility].   Expected Discharge Plan: Home w Home Health Services Barriers to Discharge: Continued Medical Work up  Expected Discharge Plan and Services   Discharge Planning Services: CM Consult   Living arrangements for the past 2 months: Single Family Home                                       Social Determinants of Health (SDOH) Interventions SDOH Screenings   Food Insecurity: No Food Insecurity (10/31/2022)  Housing: Low Risk  (10/31/2022)  Transportation Needs: No Transportation Needs (10/31/2022)  Utilities: Not At Risk (10/31/2022)  Depression (PHQ2-9): Low Risk  (07/26/2022)  Tobacco Use: Low Risk  (10/30/2022)    Readmission Risk Interventions     No data to display

## 2022-11-01 NOTE — Progress Notes (Signed)
Heart Failure Navigator Progress Note  Assessed for Heart & Vascular TOC clinic readiness.  Patient EF 45%, has a scheduled [facility] appointment on 11/10/2022. .   Navigator will sign off at this time.   Rhae Hammock, BSN, Scientist, clinical (histocompatibility and immunogenetics) Only

## 2022-11-01 NOTE — Evaluation (Signed)
Physical Therapy Evaluation Patient Details Name: Mary [name] MRN: [id number] DOB: January 10, 1927 Today's Date: 11/01/2022  History of Present Illness  89 yo female admitted with CHF. Hx of osteoporosis, chronic R hip/groin pain, suprapubic catheter, breat Ca s/p masetectomy, CHF, urinary retention  Clinical Impression  On eval, pt was CGA for mobility. She walked ~125 feet with a RW. O2 92% on RA at rest, 88% on RA with ambulation. Dyspnea 2/4. Plan is for pt to return home at discharge. Pt is agreeable to HHPT f/u. Daughter is also requesting assistance getting increased nursing care in home to help manage catheter-HHRN? Encouraged daughter to discuss assistance available to patient with [facility] team.         If plan is discharge home, recommend the following: A little help with walking and/or transfers;A little help with bathing/dressing/bathroom;Assistance with cooking/housework;Assist for transportation;Help with stairs or ramp for entrance   Can travel by private vehicle        Equipment Recommendations None recommended by PT  Recommendations for Other Services       Functional Status Assessment Patient has had a recent decline in their functional status and demonstrates the ability to make significant improvements in function in a reasonable and predictable amount of time.     Precautions / Restrictions Precautions Precautions: Fall Restrictions Weight Bearing Restrictions: No      Mobility  Bed Mobility               General bed mobility comments: oob in recliner    Transfers Overall transfer level: Needs assistance Equipment used: Rolling walker (2 wheels) Transfers: Sit to/from Stand Sit to Stand: Contact guard assist           General transfer comment: Cues for safety. Mildly unsteady when tying her robe    Ambulation/Gait Ambulation/Gait assistance: Contact guard assist Gait Distance (Feet): 125 Feet Assistive device: Rolling walker (2 wheels) Gait  Pattern/deviations: Step-through pattern, Decreased stride length, Trunk flexed       General Gait Details: CGA for safety. Fair gait speed. Dyspnea 2/4. O2 88% on RA.  Stairs            Wheelchair Mobility     Tilt Bed    Modified Rankin (Stroke Patients Only)       Balance Overall balance assessment: Needs assistance         Standing balance support: Bilateral upper extremity supported, Reliant on assistive device for balance, During functional activity Standing balance-Leahy Scale: Poor                               Pertinent Vitals/Pain Pain Assessment Pain Assessment: Faces Faces Pain Scale: Hurts a little bit Pain Location: R groin/hip Pain Descriptors / Indicators: Discomfort, Aching Pain Intervention(s): Limited activity within patient's tolerance, Monitored during session, Repositioned    Home Living Family/patient expects to be discharged to:: Private residence Living Arrangements: Alone Available Help at Discharge: Personal care attendant Type of Home: House Home Access: Stairs to enter Entrance Stairs-Rails: Doctor, general practice of Steps: 4   Home Layout: Two level;Able to live on main level with bedroom/bathroom Home Equipment: Rolling Walker (2 wheels);Cane - single point      Prior Function Prior Level of Function : Needs assist             Mobility Comments: uses RW for ambulation ADLs Comments: PCA several hours throughout the day     Extremity/Trunk Assessment  Upper Extremity Assessment Upper Extremity Assessment: Generalized weakness    Lower Extremity Assessment Lower Extremity Assessment: Generalized weakness    Cervical / Trunk Assessment Cervical / Trunk Assessment: Kyphotic  Communication      Cognition Arousal: Alert Behavior During Therapy: WFL for tasks assessed/performed Overall Cognitive Status: Within Functional Limits for tasks assessed                                           General Comments      Exercises     Assessment/Plan    PT Assessment Patient needs continued PT services  PT Problem List Decreased strength;Decreased range of motion;Decreased activity tolerance;Decreased balance;Decreased mobility;Decreased knowledge of use of DME       PT Treatment Interventions DME instruction;Gait training;Balance training;Functional mobility training;Therapeutic activities;Therapeutic exercise;Patient/family education    PT Goals (Current goals can be found in the Care Plan section)  Acute Rehab PT Goals Patient Stated Goal: to continue to get better and get back home PT Goal Formulation: With patient/family Time For Goal Achievement: 11/15/22 Potential to Achieve Goals: Good    Frequency Min 1X/week     Co-evaluation               AM-PAC PT "6 Clicks" Mobility  Outcome Measure Help needed turning from your back to your side while in a flat bed without using bedrails?: A Little Help needed moving from lying on your back to sitting on the side of a flat bed without using bedrails?: A Little Help needed moving to and from a bed to a chair (including a wheelchair)?: A Little Help needed standing up from a chair using your arms (e.g., wheelchair or bedside chair)?: A Little Help needed to walk in hospital room?: A Little Help needed climbing 3-5 steps with a railing? : A Little 6 Click Score: 18    End of Session Equipment Utilized During Treatment: Gait belt Activity Tolerance: Patient tolerated treatment well Patient left: in chair;with call bell/phone within reach;with family/visitor present   PT Visit Diagnosis: Pain;Difficulty in walking, not elsewhere classified (R26.2) Pain - part of body: Hip    Time: 1610-9604 PT Time Calculation (min) (ACUTE ONLY): 24 min   Charges:   PT Evaluation $PT Eval Low Complexity: 1 Low PT Treatments $Gait Training: 8-22 mins PT General Charges $$ ACUTE PT VISIT: 1 Visit             Faye Ramsay, PT Acute Rehabilitation  Office: [phone]

## 2022-11-02 DIAGNOSIS — I509 Heart failure, unspecified: Secondary | ICD-10-CM | POA: Diagnosis not present

## 2022-11-02 DIAGNOSIS — I5022 Chronic systolic (congestive) heart failure: Secondary | ICD-10-CM | POA: Diagnosis not present

## 2022-11-02 DIAGNOSIS — I4719 Other supraventricular tachycardia: Secondary | ICD-10-CM | POA: Diagnosis not present

## 2022-11-02 DIAGNOSIS — I10 Essential (primary) hypertension: Secondary | ICD-10-CM | POA: Diagnosis not present

## 2022-11-02 LAB — COMPREHENSIVE METABOLIC PANEL
ALT: 23 U/L (ref 0–44)
AST: 16 U/L (ref 15–41)
Albumin: 3.4 g/dL — ABNORMAL LOW (ref 3.5–5.0)
Alkaline Phosphatase: 78 U/L (ref 38–126)
Anion gap: 11 (ref 5–15)
BUN: 28 mg/dL — ABNORMAL HIGH (ref 8–23)
CO2: 25 mmol/L (ref 22–32)
Calcium: 8.8 mg/dL — ABNORMAL LOW (ref 8.9–10.3)
Chloride: 97 mmol/L — ABNORMAL LOW (ref 98–111)
Creatinine, Ser: 1.06 mg/dL — ABNORMAL HIGH (ref 0.44–1.00)
GFR, Estimated: 49 mL/min — ABNORMAL LOW (ref >60)
Glucose, Bld: 145 mg/dL — ABNORMAL HIGH (ref 70–99)
Potassium: 4.2 mmol/L (ref 3.5–5.1)
Sodium: 133 mmol/L — ABNORMAL LOW (ref 135–145)
Total Bilirubin: 0.6 mg/dL (ref 0.3–1.2)
Total Protein: 6.4 g/dL — ABNORMAL LOW (ref 6.5–8.1)

## 2022-11-02 LAB — CBC
HCT: 39.7 % (ref 36.0–46.0)
Hemoglobin: 12.2 g/dL (ref 12.0–15.0)
MCH: 28 pg (ref 26.0–34.0)
MCHC: 30.7 g/dL (ref 30.0–36.0)
MCV: 91.1 fL (ref 80.0–100.0)
Platelets: 250 10*3/uL (ref 150–400)
RBC: 4.36 MIL/uL (ref 3.87–5.11)
RDW: 15.7 % — ABNORMAL HIGH (ref 11.5–15.5)
WBC: 5.4 10*3/uL (ref 4.0–10.5)
nRBC: 0 % (ref 0.0–0.2)

## 2022-11-02 LAB — MAGNESIUM: Magnesium: 2.1 mg/dL (ref 1.7–2.4)

## 2022-11-02 MED ORDER — ENOXAPARIN SODIUM 30 MG/0.3ML IJ SOSY
30.0000 mg | PREFILLED_SYRINGE | INTRAMUSCULAR | Status: DC
  Administered 2022-11-02 – 2022-11-03 (×2): 30 mg via SUBCUTANEOUS
  Filled 2022-11-02 (×3): qty 0.3

## 2022-11-02 MED ORDER — PREDNISONE 20 MG PO TABS
20.0000 mg | ORAL_TABLET | Freq: Every day | ORAL | Status: DC
  Administered 2022-11-02 – 2022-11-04 (×3): 20 mg via ORAL
  Filled 2022-11-02 (×3): qty 1

## 2022-11-02 NOTE — Plan of Care (Addendum)
Came to check on Mary [name] at the request of one of her family members via her hospitalist.  Reviewing her records her suprapubic tube was exchanged less than a week ago and has been functioning well.  She reports that she is struggled with dehydration and she has been fine since increasing her fluid intake.  No specific urologic complaints.  Catheter is draining well.  Patient will reschedule her outpatient follow-up.

## 2022-11-02 NOTE — Progress Notes (Signed)
PROGRESS NOTE    Mary [name]  ZOX:[id number] DOB: 08/21/1926 DOA: 10/30/2022 PCP: Joselyn Arrow, MD    89 y.o. Ashkenazi [place] female with a known history of hypertension, hyperlipidemia, osteoarthritis, osteoporosis, unspecified palpitations urinary retention with suprapubic catheter presents to the emergency department for evaluation of shortness of breath. Pt was found to be vol overloaded and admitted for management of CHF exacerbation. Cardiology was consulted.  Echocardiogram showed reduced EF of 45%, currently planning GDMT.  No further aggressive workup given her age and comorbidities.     Assessment & Plan:  Principal Problem:   New onset of congestive heart failure (HCC) Active Problems:   Heart failure (HCC)   Dyspnea on exertion   Pleural effusion, bilateral   CHF exacerbation (HCC)   Acute exacerbation of congestive heart failure -Patient found to have elevated BNP.   Echocardiogram shows EF of 45%, currently planning on GDMT.  Cardiology team is following.  Will monitor and replete electrolytes as necessary.   Bilateral diffuse expiratory wheezing, improved - Combination of cardiac wheezing versus any other etiology.  Patient denies any history of tobacco use/COPD/asthma but could be mild reactive airway disease.  Transition to p.o. steroids.  Continue as needed bronchodilators  Acute kidney injury -Baseline creatinine 0.6, this morning 1.06.  I suspect secondary to diuretics.  Will hold off on Lasix and would opt to hold off on losartan    Hypertension -Cardiac meds to be adjusted for GDMT.    History of hyperlipidemia - Continue atorvastatin   History of osteoporosis - Continue calcium and vitamin D   History of muscle spasms Continue diazepam   History of urinary retention Suprapubic catheter in place.    PT/OT-home health, face-to-face completed   DVT prophylaxis: SCDs Start: 10/31/22 0045 enoxaparin (LOVENOX) injection 40 mg Start: 10/31/22 0044 Code  Status: Full Family Communication: Daughter at bedside Status is: Inpatient Remains inpatient appropriate because: Ongoing management for CHF exacerbation.  Hopefully home tomorrow               Diet Orders (From admission, onward)     Start     Ordered   10/31/22 0045  Diet Heart Room service appropriate? Yes; Fluid consistency: Thin  Diet effective now       Question Answer Comment  Room service appropriate? Yes   Fluid consistency: Thin      10/31/22 0044            Subjective: Seen at bedside, denying any complaints. Breathing has improved  Examination:  General exam: Appears calm and comfortable, elderly frail Respiratory system: Clear to auscultation. Respiratory effort normal. Cardiovascular system: S1 & S2 heard, RRR. No JVD, murmurs, rubs, gallops or clicks. No pedal edema. Gastrointestinal system: Abdomen is nondistended, soft and nontender. No organomegaly or masses felt. Normal bowel sounds heard. Central nervous system: Alert and oriented. No focal neurological deficits. Extremities: Symmetric 5 x 5 power. Skin: No rashes, lesions or ulcers Psychiatry: Judgement and insight appear normal. Mood & affect appropriate. Supra pubic catheter Objective: Vitals:   11/01/22 1940 11/01/22 2023 11/02/22 0617 11/02/22 0619  BP: (!) 91/59  119/71   Pulse: 67  75   Resp: 18  16   Temp: 97.6 F (36.4 C)  97.7 F (36.5 C)   TempSrc: Oral  Axillary   SpO2: 94% 94% 93%   Weight:    56.8 kg  Height:        Intake/Output Summary (Last 24 hours) at 11/02/2022 1015 Last data filed  at 11/02/2022 0330 Gross per 24 hour  Intake 602 ml  Output 970 ml  Net -368 ml   Filed Weights   10/31/22 0100 11/01/22 0500 11/02/22 0619  Weight: 55.7 kg 57.7 kg 56.8 kg    Scheduled Meds:  atorvastatin  40 mg Oral Daily   calcium-vitamin D  1 tablet Oral Q breakfast   cholecalciferol  2,000 Units Oral Daily   enoxaparin (LOVENOX) injection  30 mg Subcutaneous Q24H    metoprolol tartrate  25 mg Oral BID   multivitamin  1 tablet Oral Daily   predniSONE  20 mg Oral Q breakfast   sodium chloride flush  3 mL Intravenous Q12H   Continuous Infusions:  sodium chloride      Nutritional status     Body mass index is 22.9 kg/m.  Data Reviewed:   CBC: Recent Labs  Lab 10/26/22 1835 10/30/22 2040 10/31/22 0512 11/02/22 0455  WBC 6.2 6.8 5.9 5.4  NEUTROABS 4.4 4.7  --   --   HGB 12.7 13.1 12.1 12.2  HCT 39.2 41.6 38.2 39.7  MCV 88.7 89.7 90.7 91.1  PLT 235 240 226 250   Basic Metabolic Panel: Recent Labs  Lab 10/26/22 1835 10/30/22 2040 10/31/22 0512 11/01/22 0457 11/02/22 0455  NA 137 133* 137 136 133*  K 3.9 3.8 3.2* 4.4 4.2  CL 99 98 101 101 97*  CO2 31 26 25 23 25   GLUCOSE 107* 124* 96 98 145*  BUN 19 22 17 18  28*  CREATININE 0.63 0.81 0.65 0.61 1.06*  CALCIUM 9.4 8.9 8.5* 8.8* 8.8*  MG  --   --   --   --  2.1   GFR: Estimated Creatinine Clearance: 26.2 mL/min (A) (by C-G formula based on SCr of 1.06 mg/dL (H)). Liver Function Tests: Recent Labs  Lab 10/26/22 1835 10/31/22 0512 11/01/22 0457 11/02/22 0455  AST 20 21 18 16   ALT 24 27 24 23   ALKPHOS 87 83 73 78  BILITOT 0.5 0.7 0.6 0.6  PROT 6.6 5.8* 5.8* 6.4*  ALBUMIN 3.9 3.3* 3.1* 3.4*   No results for input(s): "LIPASE", "AMYLASE" in the last 168 hours. No results for input(s): "AMMONIA" in the last 168 hours. Coagulation Profile: No results for input(s): "INR", "PROTIME" in the last 168 hours. Cardiac Enzymes: No results for input(s): "CKTOTAL", "CKMB", "CKMBINDEX", "TROPONINI" in the last 168 hours. BNP (last 3 results) No results for input(s): "PROBNP" in the last 8760 hours. HbA1C: No results for input(s): "HGBA1C" in the last 72 hours. CBG: No results for input(s): "GLUCAP" in the last 168 hours. Lipid Profile: No results for input(s): "CHOL", "HDL", "LDLCALC", "TRIG", "CHOLHDL", "LDLDIRECT" in the last 72 hours. Thyroid Function Tests: No results for  input(s): "TSH", "T4TOTAL", "FREET4", "T3FREE", "THYROIDAB" in the last 72 hours. Anemia Panel: No results for input(s): "VITAMINB12", "FOLATE", "FERRITIN", "TIBC", "IRON", "RETICCTPCT" in the last 72 hours. Sepsis Labs: No results for input(s): "PROCALCITON", "LATICACIDVEN" in the last 168 hours.  No results found for this or any previous visit (from the past 240 hour(s)).       Radiology Studies: ECHOCARDIOGRAM COMPLETE  Result Date: 10/31/2022    ECHOCARDIOGRAM REPORT   Patient Name:   Mary [name] Date of Exam: 10/31/2022 Medical Rec #:  [id number]   Height:       62.0 in Accession #:    [id number]  Weight:       122.8 lb Date of Birth:  28-Sep-1926  BSA:  1.554 m Patient Age:    89 years    BP:           139/92 mmHg Patient Gender: F           HR:           60 bpm. Exam Location:  Inpatient Procedure: 2D Echo, Cardiac Doppler and Color Doppler Indications:    Congestive Heart Failure  History:        Patient has no prior history of Echocardiogram examinations.                 CHF, Signs/Symptoms:Dyspnea, Shortness of Breath and Edema; Risk                 Factors:Dyslipidemia and Hypertension. Hx cancer.  Sonographer:    Wallie Char Referring Phys: [id number] ALEXIS HUGELMEYER  Sonographer Comments: Image acquisition challenging due to uncooperative patient. Limited interrogation of valves due to pt inability to tolerate exam and being touched IMPRESSIONS  1. Left ventricular ejection fraction, by estimation, is 45%. The left ventricle has mildly decreased function. The left ventricle has no regional wall motion abnormalities. Left ventricular diastolic parameters are indeterminate.  2. Right ventricular systolic function is normal. The right ventricular size is moderately enlarged.  3. Left atrial size was severely dilated.  4. The mitral valve is degenerative. No evidence of mitral valve regurgitation. Severe mitral annular calcification.  5. The aortic valve was not well visualized.  Aortic valve regurgitation is not visualized.  6. The inferior vena cava is dilated in size with <50% respiratory variability, suggesting right atrial pressure of 15 mmHg.Full valve assessment limited by patients discomfort. Comparison(s): No prior Echocardiogram. FINDINGS  Left Ventricle: Left ventricular ejection fraction, by estimation, is 45%. The left ventricle has mildly decreased function. The left ventricle has no regional wall motion abnormalities. The left ventricular internal cavity size was normal in size. There is no left ventricular hypertrophy. Left ventricular diastolic parameters are indeterminate. Right Ventricle: The right ventricular size is moderately enlarged. No increase in right ventricular wall thickness. Right ventricular systolic function is normal. Left Atrium: Left atrial size was severely dilated. Right Atrium: Right atrial size was normal in size. Pericardium: There is no evidence of pericardial effusion. Mitral Valve: The mitral valve is degenerative in appearance. Severe mitral annular calcification. No evidence of mitral valve regurgitation. MV peak gradient, 4.2 mmHg. The mean mitral valve gradient is 1.0 mmHg. Tricuspid Valve: The tricuspid valve is normal in structure. Tricuspid valve regurgitation is mild . No evidence of tricuspid stenosis. Aortic Valve: The aortic valve was not well visualized. Aortic valve regurgitation is not visualized. Aortic valve mean gradient measures 3.0 mmHg. Aortic valve peak gradient measures 4.9 mmHg. Aortic valve area, by VTI measures 2.25 cm. Pulmonic Valve: The pulmonic valve was not well visualized. Pulmonic valve regurgitation is not visualized. Aorta: The aortic root and ascending aorta are structurally normal, with no evidence of dilitation. Venous: The inferior vena cava is dilated in size with less than 50% respiratory variability, suggesting right atrial pressure of 15 mmHg. IAS/Shunts: The interatrial septum was not well visualized.   LEFT VENTRICLE PLAX 2D LVIDd:         4.80 cm     Diastology LVIDs:         3.60 cm     LV e' medial:    4.79 cm/s LV PW:         1.00 cm     LV E/e' medial:  21.7  LV IVS:        0.90 cm     LV e' lateral:   5.89 cm/s LVOT diam:     1.90 cm     LV E/e' lateral: 17.7 LV SV:         48 LV SV Index:   31 LVOT Area:     2.84 cm  LV Volumes (MOD) LV vol d, MOD A2C: 72.3 ml LV vol d, MOD A4C: 68.5 ml LV vol s, MOD A2C: 42.9 ml LV vol s, MOD A4C: 39.6 ml LV SV MOD A2C:     29.4 ml LV SV MOD A4C:     68.5 ml LV SV MOD BP:      29.8 ml RIGHT VENTRICLE            IVC RV Basal diam:  3.60 cm    IVC diam: 3.10 cm RV S prime:     7.34 cm/s TAPSE (M-mode): 1.4 cm LEFT ATRIUM             Index        RIGHT ATRIUM           Index LA diam:        3.70 cm 2.38 cm/m   RA Area:     14.10 cm LA Vol (A2C):   85.8 ml 55.22 ml/m  RA Volume:   37.30 ml  24.01 ml/m LA Vol (A4C):   81.5 ml 52.45 ml/m LA Biplane Vol: 85.2 ml 54.83 ml/m  AORTIC VALVE AV Area (Vmax):    1.99 cm AV Area (Vmean):   2.02 cm AV Area (VTI):     2.25 cm AV Vmax:           110.50 cm/s AV Vmean:          77.550 cm/s AV VTI:            0.213 m AV Peak Grad:      4.9 mmHg AV Mean Grad:      3.0 mmHg LVOT Vmax:         77.40 cm/s LVOT Vmean:        55.200 cm/s LVOT VTI:          0.169 m LVOT/AV VTI ratio: 0.79  AORTA Ao Root diam: 2.90 cm Ao Asc diam:  2.70 cm MITRAL VALVE                TRICUSPID VALVE MV Area (PHT): 5.54 cm     TR Peak grad:   25.0 mmHg MV Area VTI:   1.76 cm     TR Vmax:        250.00 cm/s MV Peak grad:  4.2 mmHg MV Mean grad:  1.0 mmHg     SHUNTS MV Vmax:       1.02 m/s     Systemic VTI:  0.17 m MV Vmean:      54.1 cm/s    Systemic Diam: 1.90 cm MV Decel Time: 137 msec MV E velocity: 104.00 cm/s MV A velocity: 78.70 cm/s MV E/A ratio:  1.32 Riley Lam MD Electronically signed by Riley Lam MD Signature Date/Time: 10/31/2022/4:21:41 PM    Final            LOS: 2 days   Time spent= 35 mins     Joline Maxcy, MD [facility]  If 7PM-7AM, please contact night-coverage  11/02/2022, 10:15 AM

## 2022-11-02 NOTE — Plan of Care (Signed)
  Problem: Education: Goal: Knowledge of General Education information will improve Description: Including pain rating scale, medication(s)/side effects and non-pharmacologic comfort measures Outcome: Progressing   Problem: Clinical Measurements: Goal: Will remain free from infection Outcome: Progressing Goal: Diagnostic test results will improve Outcome: Progressing   Problem: Activity: Goal: Risk for activity intolerance will decrease Outcome: Progressing   Problem: Coping: Goal: Level of anxiety will decrease Outcome: Progressing   

## 2022-11-02 NOTE — TOC Progression Note (Signed)
Transition of Care [facility]) - Progression Note    Patient Details  Name: Mary [name] MRN: [id number] Date of Birth: 10/08/1926  Transition of Care [facility]) CM/SW Contact  , Olegario Messier, RN Phone Number: 11/02/2022, 1:15 PM  Clinical Narrative:  Frances Furbish HHRN/PT/OT chosen by dtr K[place] aware & following-Cory will contact [name] to coordinate home visit @ d/c.    Expected Discharge Plan: Home w Home Health Services Barriers to Discharge: Continued Medical Work up  Expected Discharge Plan and Services   Discharge Planning Services: CM Consult   Living arrangements for the past 2 months: Single Family Home                             HH Agency: [facility] Date [facility] Agency Contacted: 11/02/22 Time HH Agency Contacted: 1314 Representative spoke with at [facility] Agency: Kandee Keen   Social Determinants of Health (SDOH) Interventions SDOH Screenings   Food Insecurity: No Food Insecurity (10/31/2022)  Housing: Low Risk  (10/31/2022)  Transportation Needs: No Transportation Needs (10/31/2022)  Utilities: Not At Risk (10/31/2022)  Depression (PHQ2-9): Low Risk  (07/26/2022)  Tobacco Use: Low Risk  (10/30/2022)    Readmission Risk Interventions     No data to display

## 2022-11-02 NOTE — Progress Notes (Signed)
Patient Name: Mary [name] Date of Encounter: 11/02/2022 [facility] Cardiologist: Armanda Magic, MD   Interval Summary  .    Patient reports feeling much better today. Breathing has improved and feels back to baseline. Ankle swelling has improved. BP did get a bit low overnight, but she denies dizziness   Vital Signs .    Vitals:   11/01/22 1940 11/01/22 2023 11/02/22 0617 11/02/22 0619  BP: (!) 91/59  119/71   Pulse: 67  75   Resp: 18  16   Temp: 97.6 F (36.4 C)  97.7 F (36.5 C)   TempSrc: Oral  Axillary   SpO2: 94% 94% 93%   Weight:    56.8 kg  Height:        Intake/Output Summary (Last 24 hours) at 11/02/2022 0920 Last data filed at 11/02/2022 0330 Gross per 24 hour  Intake 602 ml  Output 970 ml  Net -368 ml      11/02/2022    6:19 AM 11/01/2022    5:00 AM 10/31/2022    1:00 AM  Last 3 Weights  Weight (lbs) 125 lb 3.5 oz 127 lb 3.3 oz 122 lb 12.7 oz  Weight (kg) 56.8 kg 57.7 kg 55.7 kg      Telemetry/ECG    Sinus rhythm with PACs and PVCs - Personally Reviewed  Physical Exam .   GEN: No acute distress.  Sitting upright in the chair eating breakfast  Neck: No JVD Cardiac: RRR. No murmurs. Radial pulses 2+ bilaterally  Respiratory: Clear to auscultation bilaterally. Normal work of breathing on room air  GI: Soft, nontender, non-distended  MS: Trace edema in BLE   Assessment & Plan .     Acute on Chronic HFmrEF  Elevated Troponin  - Patient presented complaining of exertional shortness of breath, orthopnea, leg edema. BNP elevated to 674 on presentation  - hsTn 32>29- not consistent with ACS. Likely demand ischemia due to CHF exacerbation - CXR on admission showed small bilateral pleural effusions  - Echocardiogram this admission showed EF 45%, no regional wall motion abnormalities, normal RV function  - Discussed possible nuclear stress test as an outpatient. However, given Patient's advanced age and lack of chest pain, favor conservative  approach with GDMT alone. Dr. Mayford Knife discussed with patient who was in agreement and does not want invasive procedures - Stopped verapamil yesterday due to low EF - Now on metoprolol tartrate 25 mg BID  - Stopped lisinopril yesterday with plans to transition to ARB today. However, her BP got low overnight (down to 91/59) and her creatinine increased from 0.61 to 1.06 this AM. Follow BP today and recheck renal function tomorrow AM. If BP allows, would like to start entresto prior to DC  - Patient has been on IV lasix- output at least 0.97 L urine yesterday and is net 1.7 L since admission (unsure if I/Os are complete). Lasix held today due to bump in creatinine from 0.61 to 1.06. Appears euvolemic on exam - Plan to start PO lasix tomorrow AM    Paroxysmal SVT Frequent PVCs and PACs  - Given reduced EF on echo, stopped verapamil  - Now on metoprolol tartrate 25 mg BID  - Per telemetry, appears that PVC/PAC burden has decreased a bit since starting metoprolol  - Maintain K>4, mag >2    HTN - BP did get a bit low overnight- down to 91/59. Is 119/71 this AM  - Losartan held due to bump in creatinine to 1.06 today  -  Follow BP today and recheck renal function in AM. Would benefit from ARB/entresto at discharge if BP will allow     For questions or updates, please contact [facility] Please consult [email or web address] for contact info under        Signed, Jonita Albee, PA-C

## 2022-11-03 DIAGNOSIS — I5022 Chronic systolic (congestive) heart failure: Secondary | ICD-10-CM | POA: Diagnosis not present

## 2022-11-03 DIAGNOSIS — I10 Essential (primary) hypertension: Secondary | ICD-10-CM | POA: Diagnosis not present

## 2022-11-03 DIAGNOSIS — I4719 Other supraventricular tachycardia: Secondary | ICD-10-CM | POA: Diagnosis not present

## 2022-11-03 DIAGNOSIS — I509 Heart failure, unspecified: Secondary | ICD-10-CM | POA: Diagnosis not present

## 2022-11-03 LAB — COMPREHENSIVE METABOLIC PANEL
ALT: 16 U/L (ref 0–44)
AST: 22 U/L (ref 15–41)
Albumin: 3.2 g/dL — ABNORMAL LOW (ref 3.5–5.0)
Alkaline Phosphatase: 71 U/L (ref 38–126)
Anion gap: 10 (ref 5–15)
BUN: 40 mg/dL — ABNORMAL HIGH (ref 8–23)
CO2: 25 mmol/L (ref 22–32)
Calcium: 9 mg/dL (ref 8.9–10.3)
Chloride: 91 mmol/L — ABNORMAL LOW (ref 98–111)
Creatinine, Ser: 1.09 mg/dL — ABNORMAL HIGH (ref 0.44–1.00)
GFR, Estimated: 47 mL/min — ABNORMAL LOW (ref >60)
Glucose, Bld: 107 mg/dL — ABNORMAL HIGH (ref 70–99)
Potassium: 4.8 mmol/L (ref 3.5–5.1)
Sodium: 126 mmol/L — ABNORMAL LOW (ref 135–145)
Total Bilirubin: 0.9 mg/dL (ref 0.3–1.2)
Total Protein: 6.4 g/dL — ABNORMAL LOW (ref 6.5–8.1)

## 2022-11-03 LAB — BASIC METABOLIC PANEL
Anion gap: 8 (ref 5–15)
BUN: 44 mg/dL — ABNORMAL HIGH (ref 8–23)
CO2: 28 mmol/L (ref 22–32)
Calcium: 8.7 mg/dL — ABNORMAL LOW (ref 8.9–10.3)
Chloride: 92 mmol/L — ABNORMAL LOW (ref 98–111)
Creatinine, Ser: 1.2 mg/dL — ABNORMAL HIGH (ref 0.44–1.00)
GFR, Estimated: 42 mL/min — ABNORMAL LOW (ref >60)
Glucose, Bld: 117 mg/dL — ABNORMAL HIGH (ref 70–99)
Potassium: 4.2 mmol/L (ref 3.5–5.1)
Sodium: 128 mmol/L — ABNORMAL LOW (ref 135–145)

## 2022-11-03 LAB — CBC
HCT: 37.2 % (ref 36.0–46.0)
Hemoglobin: 12.2 g/dL (ref 12.0–15.0)
MCH: 29 pg (ref 26.0–34.0)
MCHC: 32.8 g/dL (ref 30.0–36.0)
MCV: 88.6 fL (ref 80.0–100.0)
Platelets: 242 10*3/uL (ref 150–400)
RBC: 4.2 MIL/uL (ref 3.87–5.11)
RDW: 15.5 % (ref 11.5–15.5)
WBC: 8.4 10*3/uL (ref 4.0–10.5)
nRBC: 0 % (ref 0.0–0.2)

## 2022-11-03 LAB — MAGNESIUM: Magnesium: 2.3 mg/dL (ref 1.7–2.4)

## 2022-11-03 MED ORDER — LOSARTAN POTASSIUM 25 MG PO TABS
12.5000 mg | ORAL_TABLET | Freq: Every day | ORAL | Status: DC
  Administered 2022-11-03: 12.5 mg via ORAL
  Filled 2022-11-03: qty 1

## 2022-11-03 MED ORDER — FUROSEMIDE 20 MG PO TABS
20.0000 mg | ORAL_TABLET | Freq: Every day | ORAL | Status: DC
  Administered 2022-11-04: 20 mg via ORAL
  Filled 2022-11-03: qty 1

## 2022-11-03 MED ORDER — SODIUM CHLORIDE 0.9 % IV BOLUS
500.0000 mL | Freq: Once | INTRAVENOUS | Status: AC
  Administered 2022-11-03: 500 mL via INTRAVENOUS

## 2022-11-03 MED ORDER — METOPROLOL SUCCINATE ER 50 MG PO TB24
25.0000 mg | ORAL_TABLET | Freq: Every day | ORAL | Status: DC
  Administered 2022-11-03 – 2022-11-04 (×2): 25 mg via ORAL
  Filled 2022-11-03 (×2): qty 1

## 2022-11-03 NOTE — Progress Notes (Signed)
Physical Therapy Treatment Patient Details Name: Mary [name] MRN: [id number] DOB: November 01, 1926 Today's Date: 11/03/2022   History of Present Illness 89 yo female admitted with CHF. Hx of osteoporosis, chronic R hip/groin pain, suprapubic catheter, breat Ca s/p masetectomy, CHF, urinary retention    PT Comments  Pt ambulated in hallway 200 feet twice.  Pt reports only being limited by chronic leg pain.  Pt and daughter anticipate d/c home soon.     If plan is discharge home, recommend the following: A little help with walking and/or transfers;A little help with bathing/dressing/bathroom;Assistance with cooking/housework;Assist for transportation;Help with stairs or ramp for entrance   Can travel by private vehicle        Equipment Recommendations  None recommended by PT    Recommendations for Other Services       Precautions / Restrictions Precautions Precautions: Fall     Mobility  Bed Mobility               General bed mobility comments: pt in recliner    Transfers Overall transfer level: Needs assistance Equipment used: Rolling walker (2 wheels) Transfers: Sit to/from Stand Sit to Stand: Contact guard assist           General transfer comment: for safety    Ambulation/Gait Ambulation/Gait assistance: Contact guard assist Gait Distance (Feet): 200 Feet (x2) Assistive device: Rolling walker (2 wheels) Gait Pattern/deviations: Step-through pattern, Decreased stride length, Trunk flexed       General Gait Details: cues for posture and looking forward; ambulated 200 feet with her RW (which is set too high for pt); pt agreeable to attempt again with correct height so performed another 200 feet; pt has been using her RW since Jan so hesitant to change at this time but recommended she slowly drop down (daughter in room and agreeable to assist pt upon d/c)   Stairs             Wheelchair Mobility     Tilt Bed    Modified Rankin (Stroke Patients  Only)       Balance                                            Cognition Arousal: Alert Behavior During Therapy: WFL for tasks assessed/performed Overall Cognitive Status: Within Functional Limits for tasks assessed                                          Exercises      General Comments        Pertinent Vitals/Pain Pain Assessment Pain Assessment: Faces Faces Pain Scale: Hurts little more Pain Location: R groin/hip and left knee (reports chronic) Pain Descriptors / Indicators: Discomfort, Aching Pain Intervention(s): Monitored during session, Repositioned    Home Living                          Prior Function            PT Goals (current goals can now be found in the care plan section) Progress towards PT goals: Progressing toward goals    Frequency           PT Plan      Co-evaluation  AM-PAC PT "6 Clicks" Mobility   Outcome Measure  Help needed turning from your back to your side while in a flat bed without using bedrails?: A Little Help needed moving from lying on your back to sitting on the side of a flat bed without using bedrails?: A Little Help needed moving to and from a bed to a chair (including a wheelchair)?: A Little Help needed standing up from a chair using your arms (e.g., wheelchair or bedside chair)?: A Little Help needed to walk in hospital room?: A Little Help needed climbing 3-5 steps with a railing? : A Little 6 Click Score: 18    End of Session   Activity Tolerance: Patient tolerated treatment well Patient left: in chair;with call bell/phone within reach;with family/visitor present   PT Visit Diagnosis: Difficulty in walking, not elsewhere classified (R26.2)     Time: 5409-8119 PT Time Calculation (min) (ACUTE ONLY): 26 min  Charges:    $Gait Training: 23-37 mins PT General Charges $$ ACUTE PT VISIT: 1 Visit                    Paulino Door, DPT Physical  Therapist Acute Rehabilitation Services Office: [phone]    Mary [name] 11/03/2022, 3:07 PM

## 2022-11-03 NOTE — Plan of Care (Signed)
  Problem: Clinical Measurements: Goal: Ability to maintain clinical measurements within normal limits will improve Outcome: Progressing Goal: Will remain free from infection Outcome: Progressing   Problem: Activity: Goal: Risk for activity intolerance will decrease Outcome: Progressing   Problem: Nutrition: Goal: Adequate nutrition will be maintained Outcome: Progressing   Problem: Coping: Goal: Level of anxiety will decrease Outcome: Progressing   

## 2022-11-03 NOTE — Progress Notes (Addendum)
Patient Name: Mary [name] Date of Encounter: 11/03/2022 [facility] Cardiologist: Armanda Magic, MD   Interval Summary  .    Doing well and thinks she is back to her baseline.  No CP or SOB Vital Signs .    Vitals:   11/02/22 1306 11/02/22 2032 11/03/22 0500 11/03/22 0513  BP: 132/81 136/81  121/80  Pulse: 78 80  68  Resp:  18  18  Temp: (!) 97.3 F (36.3 C) 98.2 F (36.8 C)  97.8 F (36.6 C)  TempSrc: Oral Oral  Oral  SpO2: 99% 97%  96%  Weight:   53.5 kg   Height:        Intake/Output Summary (Last 24 hours) at 11/03/2022 0754 Last data filed at 11/03/2022 0600 Gross per 24 hour  Intake 650 ml  Output 750 ml  Net -100 ml      11/03/2022    5:00 AM 11/02/2022    6:19 AM 11/01/2022    5:00 AM  Last 3 Weights  Weight (lbs) 117 lb 15.1 oz 125 lb 3.5 oz 127 lb 3.3 oz  Weight (kg) 53.5 kg 56.8 kg 57.7 kg      Telemetry/ECG    NSR with PACs and PVCs- Personally Reviewed  Physical Exam .   GEN: Well nourished, well developed in no acute distress HEENT: Normal NECK: No JVD; No carotid bruits LYMPHATICS: No lymphadenopathy CARDIAC:RRR, no murmurs, rubs, gallops RESPIRATORY:  Clear to auscultation without rales, wheezing or rhonchi  ABDOMEN: Soft, non-tender, non-distended MUSCULOSKELETAL:  No edema; No deformity  SKIN: Warm and dry NEUROLOGIC:  Alert and oriented x 3 PSYCHIATRIC:  Normal affect  Assessment & Plan .     Acute on Chronic HFmrEF  Elevated Troponin  - Patient presented complaining of exertional shortness of breath, orthopnea, leg edema. BNP elevated to 674 on presentation  - hsTn 32>29- not consistent with ACS. Likely demand ischemia due to CHF exacerbation - CXR on admission showed small bilateral pleural effusions  - Echocardiogram this admission showed EF 45%, no regional wall motion abnormalities, normal RV function  - Discussed possible nuclear stress test as an outpatient. However, given Patient's advanced age and lack of chest pain,  favor conservative approach with GDMT alone. Dr. Mayford Knife discussed with patient who was in agreement and does not want invasive procedures - Stopped verapamil due to low EF - Now on metoprolol tartrate 25 mg BID  - Stopped lisinopril with plans to transition to ARB but BP was soft.  BP is good now so will add Losartan 12.5mg  daily today and increase as BP tolerates.  Would like to get transitioned to [facility] but unsure her Bp will tolerate. - Patient has been on IV lasix but then held today due to bump in creatinine from 0.61 to 1.06. Now 1.09 today - Appears euvolemic on exam - BUN/SCr up slightly so will hold diuretics again today and start Lasix 20mg  daily PO tomorrow - transition metoprolol tartrate to Toprol XL 25mg  daily - she is adamant that she wants to go home today but would like to see if BP is going to tolerate the addition of low dose ARB.  If BP remains stable this afternoon then can go home - will arrange followup in our [facility] clinic next week with BMET at that time - continue atorvastatin 40mg  daily - would send out on Lasix 20mg  daily PO starting tomorrow   Paroxysmal SVT Frequent PVCs and PACs  - Given reduced EF on  echo, stopped verapamil  - Now on metoprolol tartrate 25 mg BID >>transition to Toprol Xl 25mg  daily (will give lower dose since adding ARB) - Per telemetry, appears that PVC/PAC burden has decreased a bit since starting metoprolol  - Maintain K>4, mag >2    HTN - BP stable today - adding low dose Losartan 12.5mg  daily - continue BB   Total time spent with patient today 34 minutes. This includes reviewing records, evaluating the patient and coordinating care. Face-to-face time >50%.  For questions or updates, please contact  [facility] Please consult [email or web address] for contact info under        Signed, Armanda Magic, MD

## 2022-11-03 NOTE — Progress Notes (Signed)
PROGRESS NOTE    Mary [name]  ZOX:[id number] DOB: 10-05-1926 DOA: 10/30/2022 PCP: Joselyn Arrow, MD    89 y.o. Ashkenazi [place] female with a known history of hypertension, hyperlipidemia, osteoarthritis, osteoporosis, unspecified palpitations urinary retention with suprapubic catheter presents to the emergency department for evaluation of shortness of breath. Pt was found to be vol overloaded and admitted for management of CHF exacerbation. Cardiology was consulted.  Echocardiogram showed reduced EF of 45%, currently planning GDMT.  No further aggressive workup given her age and comorbidities.     Assessment & Plan:  Principal Problem:   New onset of congestive heart failure (HCC) Active Problems:   Heart failure (HCC)   Dyspnea on exertion   Pleural effusion, bilateral   CHF exacerbation (HCC)   Acute exacerbation of congestive heart failure -Patient found to have elevated BNP.   Echocardiogram shows EF of 45%, currently planning on GDMT.  Cardiology team is following.  Will monitor and replete electrolytes as necessary.  GDMT, on Toprol XL, losartan.  Eventually will transition to [facility] as outpatient.   Bilateral diffuse expiratory wheezing, improved - Combination of cardiac wheezing versus any other etiology.  Patient denies any history of tobacco use/COPD/asthma but could be mild reactive airway disease.  As needed dilators, prednisone 20 mg total of 5 days  Acute kidney injury -Baseline creatinine 0.6, this morning 1.09.  I suspect secondary to diuretics.  Hold off on diuretics  Hyponatremia - Secondary to hypovolemia.    Hypertension -Cardiac meds to be adjusted for GDMT.  IV as needed   History of hyperlipidemia - Continue atorvastatin   History of osteoporosis - Continue calcium and vitamin D   History of muscle spasms Continue diazepam   History of urinary retention Suprapubic catheter in place.    PT/OT-home health, face-to-face completed   DVT  prophylaxis: SCDs Start: 10/31/22 0045 enoxaparin (LOVENOX) injection 40 mg Start: 10/31/22 0044 Code Status: Full Family Communication: Daughter at bedside Status is: Inpatient Remains inpatient appropriate because: Ongoing management for CHF exacerbation, AKI and hyponatremia     If her labs look better this morning she may build to go home          Diet Orders (From admission, onward)     Start     Ordered   10/31/22 0045  Diet Heart Room service appropriate? Yes; Fluid consistency: Thin  Diet effective now       Question Answer Comment  Room service appropriate? Yes   Fluid consistency: Thin      10/31/22 0044            Subjective: Sitting up on the recliner, does not have any complaints. Patient really wants to go home later today if possible  Examination:  General exam: Appears calm and comfortable  Respiratory system: Clear to auscultation. Respiratory effort normal. Cardiovascular system: S1 & S2 heard, RRR. No JVD, murmurs, rubs, gallops or clicks. No pedal edema. Gastrointestinal system: Abdomen is nondistended, soft and nontender. No organomegaly or masses felt. Normal bowel sounds heard. Central nervous system: Alert and oriented. No focal neurological deficits. Extremities: Symmetric 5 x 5 power. Skin: No rashes, lesions or ulcers Psychiatry: Judgement and insight appear normal. Mood & affect appropriate.  Objective: Vitals:   11/02/22 1306 11/02/22 2032 11/03/22 0500 11/03/22 0513  BP: 132/81 136/81  121/80  Pulse: 78 80  68  Resp:  18  18  Temp: (!) 97.3 F (36.3 C) 98.2 F (36.8 C)  97.8 F (36.6 C)  TempSrc:  Oral Oral  Oral  SpO2: 99% 97%  96%  Weight:   53.5 kg   Height:        Intake/Output Summary (Last 24 hours) at 11/03/2022 1245 Last data filed at 11/03/2022 0854 Gross per 24 hour  Intake 410 ml  Output 950 ml  Net -540 ml   Filed Weights   11/01/22 0500 11/02/22 0619 11/03/22 0500  Weight: 57.7 kg 56.8 kg 53.5 kg     Scheduled Meds:  atorvastatin  40 mg Oral Daily   calcium-vitamin D  1 tablet Oral Q breakfast   cholecalciferol  2,000 Units Oral Daily   enoxaparin (LOVENOX) injection  30 mg Subcutaneous Q24H   [START ON 11/04/2022] furosemide  20 mg Oral Daily   losartan  12.5 mg Oral Daily   metoprolol succinate  25 mg Oral Daily   multivitamin  1 tablet Oral Daily   predniSONE  20 mg Oral Q breakfast   sodium chloride flush  3 mL Intravenous Q12H   Continuous Infusions:  sodium chloride      Nutritional status     Body mass index is 21.57 kg/m.  Data Reviewed:   CBC: Recent Labs  Lab 10/30/22 2040 10/31/22 0512 11/02/22 0455 11/03/22 0428  WBC 6.8 5.9 5.4 8.4  NEUTROABS 4.7  --   --   --   HGB 13.1 12.1 12.2 12.2  HCT 41.6 38.2 39.7 37.2  MCV 89.7 90.7 91.1 88.6  PLT 240 226 250 242   Basic Metabolic Panel: Recent Labs  Lab 10/30/22 2040 10/31/22 0512 11/01/22 0457 11/02/22 0455 11/03/22 0428  NA 133* 137 136 133* 126*  K 3.8 3.2* 4.4 4.2 4.8  CL 98 101 101 97* 91*  CO2 26 25 23 25 25   GLUCOSE 124* 96 98 145* 107*  BUN 22 17 18  28* 40*  CREATININE 0.81 0.65 0.61 1.06* 1.09*  CALCIUM 8.9 8.5* 8.8* 8.8* 9.0  MG  --   --   --  2.1 2.3   GFR: Estimated Creatinine Clearance: 25.5 mL/min (A) (by C-G formula based on SCr of 1.09 mg/dL (H)). Liver Function Tests: Recent Labs  Lab 10/31/22 0512 11/01/22 0457 11/02/22 0455 11/03/22 0428  AST 21 18 16 22   ALT 27 24 23 16   ALKPHOS 83 73 78 71  BILITOT 0.7 0.6 0.6 0.9  PROT 5.8* 5.8* 6.4* 6.4*  ALBUMIN 3.3* 3.1* 3.4* 3.2*   No results for input(s): "LIPASE", "AMYLASE" in the last 168 hours. No results for input(s): "AMMONIA" in the last 168 hours. Coagulation Profile: No results for input(s): "INR", "PROTIME" in the last 168 hours. Cardiac Enzymes: No results for input(s): "CKTOTAL", "CKMB", "CKMBINDEX", "TROPONINI" in the last 168 hours. BNP (last 3 results) No results for input(s): "PROBNP" in the last  8760 hours. HbA1C: No results for input(s): "HGBA1C" in the last 72 hours. CBG: No results for input(s): "GLUCAP" in the last 168 hours. Lipid Profile: No results for input(s): "CHOL", "HDL", "LDLCALC", "TRIG", "CHOLHDL", "LDLDIRECT" in the last 72 hours. Thyroid Function Tests: No results for input(s): "TSH", "T4TOTAL", "FREET4", "T3FREE", "THYROIDAB" in the last 72 hours. Anemia Panel: No results for input(s): "VITAMINB12", "FOLATE", "FERRITIN", "TIBC", "IRON", "RETICCTPCT" in the last 72 hours. Sepsis Labs: No results for input(s): "PROCALCITON", "LATICACIDVEN" in the last 168 hours.  No results found for this or any previous visit (from the past 240 hour(s)).       Radiology Studies: No results found.  LOS: 3 days   Time spent= 35 mins    Miguel Rota, MD [facility]  If 7PM-7AM, please contact night-coverage  11/03/2022, 12:45 PM

## 2022-11-03 NOTE — TOC Progression Note (Signed)
Transition of Care [facility]) - Progression Note    Patient Details  Name: Mary [name] MRN: [id number] Date of Birth: May 14, 1926  [facility]) CM/SW Contact  Demone Lyles, Olegario Messier, RN Phone Number: 11/03/2022, 1:42 PM  Clinical Narrative:  Provided dtr [name] w/Geriatric PCP [facility] listing to rm.     Expected Discharge Plan: Home w Home Health Services Barriers to Discharge: Continued Medical Work up  Expected Discharge Plan and Services   Discharge Planning Services: CM Consult   Living arrangements for the past 2 months: Single Family Home                             HH Agency: [facility] Date [facility] Agency Contacted: 11/02/22 Time HH Agency Contacted: [phone] Representative spoke with at [facility] Agency: Kandee Keen   Social Determinants of Health (SDOH) Interventions SDOH Screenings   Food Insecurity: No Food Insecurity (10/31/2022)  Housing: Low Risk  (10/31/2022)  Transportation Needs: No Transportation Needs (10/31/2022)  Utilities: Not At Risk (10/31/2022)  Depression (PHQ2-9): Low Risk  (07/26/2022)  Tobacco Use: Low Risk  (10/30/2022)    Readmission Risk Interventions     No data to display

## 2022-11-04 ENCOUNTER — Telehealth: Payer: Self-pay | Admitting: Physician Assistant

## 2022-11-04 DIAGNOSIS — I509 Heart failure, unspecified: Secondary | ICD-10-CM | POA: Diagnosis not present

## 2022-11-04 LAB — CBC
HCT: 37.7 % (ref 36.0–46.0)
Hemoglobin: 12.1 g/dL (ref 12.0–15.0)
MCH: 28.6 pg (ref 26.0–34.0)
MCHC: 32.1 g/dL (ref 30.0–36.0)
MCV: 89.1 fL (ref 80.0–100.0)
Platelets: 238 10*3/uL (ref 150–400)
RBC: 4.23 MIL/uL (ref 3.87–5.11)
RDW: 15.5 % (ref 11.5–15.5)
WBC: 6.2 10*3/uL (ref 4.0–10.5)
nRBC: 0 % (ref 0.0–0.2)

## 2022-11-04 LAB — BASIC METABOLIC PANEL
Anion gap: 8 (ref 5–15)
BUN: 40 mg/dL — ABNORMAL HIGH (ref 8–23)
CO2: 24 mmol/L (ref 22–32)
Calcium: 8.9 mg/dL (ref 8.9–10.3)
Chloride: 99 mmol/L (ref 98–111)
Creatinine, Ser: 0.89 mg/dL (ref 0.44–1.00)
GFR, Estimated: >60 mL/min (ref >60)
Glucose, Bld: 92 mg/dL (ref 70–99)
Potassium: 4.2 mmol/L (ref 3.5–5.1)
Sodium: 131 mmol/L — ABNORMAL LOW (ref 135–145)

## 2022-11-04 LAB — MAGNESIUM: Magnesium: 2.4 mg/dL (ref 1.7–2.4)

## 2022-11-04 MED ORDER — PREDNISONE 20 MG PO TABS: 20.0000 mg | ORAL_TABLET | Freq: Every day | ORAL | 0 refills | Status: AC

## 2022-11-04 MED ORDER — METOPROLOL SUCCINATE ER 25 MG PO TB24: 25.0000 mg | ORAL_TABLET | Freq: Every day | ORAL | 0 refills | Status: DC

## 2022-11-04 MED ORDER — LOSARTAN POTASSIUM 25 MG PO TABS: 12.5000 mg | ORAL_TABLET | Freq: Every day | ORAL | 0 refills | Status: DC

## 2022-11-04 MED ORDER — EMPAGLIFLOZIN 10 MG PO TABS
10.0000 mg | ORAL_TABLET | Freq: Every day | ORAL | Status: DC
  Administered 2022-11-04: 10 mg via ORAL
  Filled 2022-11-04: qty 1

## 2022-11-04 MED ORDER — SACUBITRIL-VALSARTAN 24-26 MG PO TABS
1.0000 | ORAL_TABLET | Freq: Two times a day (BID) | ORAL | Status: DC
  Filled 2022-11-04: qty 1

## 2022-11-04 MED ORDER — EMPAGLIFLOZIN 10 MG PO TABS: 10.0000 mg | ORAL_TABLET | Freq: Every day | ORAL | 0 refills | Status: DC

## 2022-11-04 MED ORDER — SPIRONOLACTONE 25 MG PO TABS: 12.5000 mg | ORAL_TABLET | Freq: Every day | ORAL | 0 refills | Status: DC

## 2022-11-04 MED ORDER — LOSARTAN POTASSIUM 25 MG PO TABS
12.5000 mg | ORAL_TABLET | Freq: Every day | ORAL | Status: DC
  Administered 2022-11-04: 12.5 mg via ORAL
  Filled 2022-11-04: qty 1

## 2022-11-04 MED ORDER — FUROSEMIDE 20 MG PO TABS: 20.0000 mg | ORAL_TABLET | Freq: Every day | ORAL | 0 refills | Status: DC

## 2022-11-04 MED ORDER — SPIRONOLACTONE 12.5 MG HALF TABLET
12.5000 mg | ORAL_TABLET | Freq: Every day | ORAL | Status: DC
  Administered 2022-11-04: 12.5 mg via ORAL
  Filled 2022-11-04: qty 1

## 2022-11-04 NOTE — Progress Notes (Addendum)
Rounding Note    Patient Name: Mary [name] Date of Encounter: 11/04/2022  [facility] Cardiologist: Establish with Dr Allyson Sabal  Subjective   No complaints  Inpatient Medications    Scheduled Meds:  atorvastatin  40 mg Oral Daily   calcium-vitamin D  1 tablet Oral Q breakfast   cholecalciferol  2,000 Units Oral Daily   enoxaparin (LOVENOX) injection  30 mg Subcutaneous Q24H   furosemide  20 mg Oral Daily   losartan  12.5 mg Oral Daily   metoprolol succinate  25 mg Oral Daily   multivitamin  1 tablet Oral Daily   predniSONE  20 mg Oral Q breakfast   sodium chloride flush  3 mL Intravenous Q12H   Continuous Infusions:  sodium chloride     PRN Meds: sodium chloride, acetaminophen, diazepam, guaiFENesin, hydrALAZINE, ipratropium-albuterol, loratadine, metoprolol tartrate, ondansetron (ZOFRAN) IV, mouth rinse, oxyCODONE, senna-docusate, sodium chloride flush, traZODone   Vital Signs    Vitals:   11/03/22 1254 11/03/22 2023 11/04/22 0453 11/04/22 0500  BP: (!) 134/90 (!) 147/84 (!) 147/95   Pulse: 82 64 83   Resp: 18 16 14    Temp: 98 F (36.7 C) (!) 97.5 F (36.4 C) (!) 97.4 F (36.3 C)   TempSrc: Oral Oral Oral   SpO2: 93% 98% 95%   Weight:    57.6 kg  Height:        Intake/Output Summary (Last 24 hours) at 11/04/2022 0741 Last data filed at 11/04/2022 0600 Gross per 24 hour  Intake 170 ml  Output 1200 ml  Net -1030 ml      11/04/2022    5:00 AM 11/03/2022    5:00 AM 11/02/2022    6:19 AM  Last 3 Weights  Weight (lbs) 126 lb 15.8 oz 117 lb 15.1 oz 125 lb 3.5 oz  Weight (kg) 57.6 kg 53.5 kg 56.8 kg      Telemetry    SR, short runs SVT - Personally Reviewed  ECG    N/a - Personally Reviewed  Physical Exam   GEN: No acute distress.   Neck: No JVD Cardiac: RRR, no murmurs, rubs, or gallops.  Respiratory: Clear to auscultation bilaterally. GI: Soft, nontender, non-distended  MS: No edema; No deformity. Neuro:  Nonfocal  Psych: Normal  affect   Labs    High Sensitivity Troponin:   Recent Labs  Lab 10/26/22 1835 10/30/22 2040 10/31/22 0043  TROPONINIHS 37* 32* 29*     Chemistry Recent Labs  Lab 11/01/22 0457 11/02/22 0455 11/03/22 0428 11/03/22 1243 11/04/22 0524  NA 136 133* 126* 128* 131*  K 4.4 4.2 4.8 4.2 4.2  CL 101 97* 91* 92* 99  CO2 23 25 25 28 24   GLUCOSE 98 145* 107* 117* 92  BUN 18 28* 40* 44* 40*  CREATININE 0.61 1.06* 1.09* 1.20* 0.89  CALCIUM 8.8* 8.8* 9.0 8.7* 8.9  MG  --  2.1 2.3  --  2.4  PROT 5.8* 6.4* 6.4*  --   --   ALBUMIN 3.1* 3.4* 3.2*  --   --   AST 18 16 22   --   --   ALT 24 23 16   --   --   ALKPHOS 73 78 71  --   --   BILITOT 0.6 0.6 0.9  --   --   GFRNONAA >60 49* 47* 42* >60  ANIONGAP 12 11 10 8 8     Lipids No results for input(s): "CHOL", "TRIG", "HDL", "LABVLDL", "LDLCALC", "CHOLHDL" in  the last 168 hours.  Hematology Recent Labs  Lab 11/02/22 0455 11/03/22 0428 11/04/22 0524  WBC 5.4 8.4 6.2  RBC 4.36 4.20 4.23  HGB 12.2 12.2 12.1  HCT 39.7 37.2 37.7  MCV 91.1 88.6 89.1  MCH 28.0 29.0 28.6  MCHC 30.7 32.8 32.1  RDW 15.7* 15.5 15.5  PLT 250 242 238   Thyroid No results for input(s): "TSH", "FREET4" in the last 168 hours.  BNP Recent Labs  Lab 10/30/22 2040  BNP 674.3*    DDimer No results for input(s): "DDIMER" in the last 168 hours.   Radiology    No results found.  Cardiac Studies    Assessment & Plan    1.Acute on chronic HFmrEF - 10/2022 echo: LVEF 45%, indet diastolic, severe LAE - BNP 674, small bilateral effusions - from rounding notes patient has not wanted invasive procedures  - medical therapy has been limited by soft bp's - on toprol 25mg  daily, losartan 12.5mg . Consider transition to entresto as outpatient pending bp's. Add aldactone 12.5, jardiance 10mg  daily.  -has been diuresed, now on oral lasix. Remains euovlemic   2. Frequent PVCs/PACs - continue beta blocker  Ok for discharge. She would like to establish with Dr  Allyson Sabal who took care of her brother, we will arrange f/u and sign off inpatient care.    For questions or updates, please contact [facility] Please consult [email or web address] for contact info under        Signed, Dina Rich, MD  11/04/2022, 7:41 AM

## 2022-11-04 NOTE — Plan of Care (Signed)

## 2022-11-04 NOTE — Progress Notes (Signed)
Per Dr. Wyline Mood, patient wish to establish with Dr. Allyson Sabal. Will change follow up to [facility] and message both Dr. Allyson Sabal and Dr. Mayford Knife who recently saw the patient.

## 2022-11-04 NOTE — Telephone Encounter (Signed)
Per Dr. Dina Rich who rounded on the patient this morning, patient wish to establish with Dr. Allyson Sabal in [place] clinic. Will notify both Dr. Mayford Knife who recently seen the patient and Dr. Allyson Sabal. Initial post hospital follow up appt rescheduled with APP in [facility].

## 2022-11-04 NOTE — TOC Transition Note (Signed)
Transition of Care [facility]) - CM/SW Discharge Note   Patient Details  Name: Lyle Pruyn MRN: [id number] Date of Birth: 11-08-1926  Transition of Care [facility]) CM/SW Contact:  Lanier Clam, RN Phone Number: 11/04/2022, 12:03 PM   Clinical Narrative:d/c home. HHC set up. Has own transport home. No further CM needs.       Final next level of care: Home w Home Health Services Barriers to Discharge: No Barriers Identified   Patient Goals and CMS Choice CMS Medicare.gov Compare Post Acute Care list provided to:: Patient Choice offered to / list presented to : Patient  Discharge Placement                         Discharge Plan and Services Additional resources added to the After Visit Summary for     Discharge Planning Services: CM Consult                        [facility] Agency: [facility] Date [facility] Agency Contacted: 11/02/22 Time HH Agency Contacted: [phone] Representative spoke with at [facility] Agency: Kandee Keen  Social Determinants of Health (SDOH) Interventions SDOH Screenings   Food Insecurity: No Food Insecurity (10/31/2022)  Housing: Low Risk  (10/31/2022)  Transportation Needs: No Transportation Needs (11/03/2022)  Utilities: Not At Risk (10/31/2022)  Depression (PHQ2-9): Low Risk  (07/26/2022)  Tobacco Use: Low Risk  (10/30/2022)     Readmission Risk Interventions     No data to display

## 2022-11-04 NOTE — Discharge Summary (Addendum)
Physician Discharge Summary  Jaclynne Martinka WUJ:[id number] DOB: Dec 13, 1926 DOA: 10/30/2022  PCP: Joselyn Arrow, MD  Admit date: 10/30/2022 Discharge date: 11/04/2022  Admitted From: [facility] Disposition:  Home  Recommendations for Outpatient Follow-up:  Follow up with PCP in 1-2 weeks Please obtain BMP/CBC in one week your next doctors visit.  Patient started on cardiac medications-Toprol, losartan, Lasix, Aldactone, Jardiance.  Outpatient follow-up with cardiology.   Discharge Condition: Stable CODE STATUS: Full code Diet recommendation: Heart healthy  89 y.o. [place]kenazi [place] female with a known history of hypertension, hyperlipidemia, osteoarthritis, osteoporosis, unspecified palpitations urinary retention with suprapubic catheter presents to the emergency department for evaluation of shortness of breath. Pt was found to be vol overloaded and admitted for management of CHF exacerbation. Cardiology was consulted.  Echocardiogram showed reduced EF of 45%, currently planning GDMT.  No further aggressive workup given her age and comorbidities.     Assessment & Plan:  Principal Problem:   New onset of congestive heart failure (HCC) Active Problems:   Heart failure (HCC)   Dyspnea on exertion   Pleural effusion, bilateral   CHF exacerbation (HCC)   Acute exacerbation of congestive heart failure; ef 45%  Echocardiogram shows EF of 45%, Seen by Cardiology. GDMT, on Toprol XL, losartan, Aldactone and Lasix p.o.  Eventually will transition to [facility] as outpatient.   Bilateral diffuse expiratory wheezing, improved - Combination of cardiac wheezing versus any other etiology.  Patient denies any history of tobacco use/COPD/asthma but could be mild reactive airway disease.  As needed dilators, prednisone 20 mg for 1 more day  Acute kidney injury -Baseline creatinine 0.6, peaked at 1.2, now back to baseline.  Hyponatremia - Secondary to hypovolemia.    Hypertension -Cardiac meds to be adjusted  for GDMT   History of hyperlipidemia - Continue atorvastatin   History of osteoporosis - Continue calcium and vitamin D   History of muscle spasms Continue diazepam   History of urinary retention Suprapubic catheter in place.    PT/OT-home health, face-to-face completed   DVT prophylaxis: SCDs Start: 10/31/22 0045 enoxaparin (LOVENOX) injection 40 mg Start: 10/31/22 0044 Code Status: Full Family Communication: Daughter at bedside Status is: Inpatient Remains inpatient appropriate because: Ongoing management for CHF exacerbation, AKI and hyponatremia       Discharge Diagnoses:  Principal Problem:   New onset of congestive heart failure (HCC) Active Problems:   Primary hypertension   Heart failure with mid-range ejection fraction (HFmEF) (HCC)   Dyspnea on exertion   Pleural effusion, bilateral   CHF exacerbation (HCC)   Acute on chronic systolic CHF (congestive heart failure) (HCC)   PAT (paroxysmal atrial tachycardia)   DCM (dilated cardiomyopathy) (HCC)      Consultations: [facility] cardiology  Subjective: Feeling well wishing to go home.  Daughter at bedside  Discharge Exam: Vitals:   11/03/22 2023 11/04/22 0453  BP: (!) 147/84 (!) 147/95  Pulse: 64 83  Resp: 16 14  Temp: (!) 97.5 F (36.4 C) (!) 97.4 F (36.3 C)  SpO2: 98% 95%   Vitals:   11/03/22 1254 11/03/22 2023 11/04/22 0453 11/04/22 0500  BP: (!) 134/90 (!) 147/84 (!) 147/95   Pulse: 82 64 83   Resp: 18 16 14    Temp: 98 F (36.7 C) (!) 97.5 F (36.4 C) (!) 97.4 F (36.3 C)   TempSrc: Oral Oral Oral   SpO2: 93% 98% 95%   Weight:    57.6 kg  Height:        General: Pt  is alert, awake, not in acute distress Cardiovascular: RRR, S1/S2 +, no rubs, no gallops Respiratory: CTA bilaterally, no wheezing, no rhonchi Abdominal: Soft, NT, ND, bowel sounds + Extremities: no edema, no cyanosis  Discharge Instructions   Allergies as of 11/04/2022       Reactions   Arimidex [anastrozole] Rash    Avelox [moxifloxacin Hcl In Nacl] Nausea Only, Rash   Azithromycin Nausea Only, Rash   Bactrim Nausea Only, Rash   Biaxin [clarithromycin] Nausea Only, Rash   Nitrofurantoin Nausea Only, Rash, Other (See Comments)   Penicillins Nausea Only, Rash   Sulfa Antibiotics Nausea And Vomiting, Rash   Sulfasalazine Rash, Other (See Comments)   GI intolerance    Sulfur Nausea And Vomiting, Rash        Medication List     STOP taking these medications    verapamil 360 MG 24 hr capsule Commonly known as: VERELAN       TAKE these medications    acetaminophen 500 MG tablet Commonly known as: TYLENOL Take 1,000 mg by mouth 2 (two) times daily at 8 am and 10 pm.   atorvastatin 40 MG tablet Commonly known as: LIPITOR TAKE 1 TABLET(40 MG) BY MOUTH DAILY   bisacodyl 5 MG EC tablet Commonly known as: DULCOLAX Take 5 mg by mouth daily as needed for moderate constipation.   denosumab 60 MG/ML Soln injection Commonly known as: PROLIA Inject 60 mg into the skin every 6 (six) months. Administer in upper arm, thigh, or abdomen   diazepam 2 MG tablet Commonly known as: Valium TAKE 1 TABLET(2 MG) BY MOUTH EVERY 8 HOURS AS NEEDED FOR MUSCLE SPASMS   empagliflozin 10 MG Tabs tablet Commonly known as: JARDIANCE Take 1 tablet (10 mg total) by mouth daily.   fluticasone 0.005 % ointment Commonly known as: CUTIVATE Apply 1 Application topically 2 (two) times daily.   furosemide 20 MG tablet Commonly known as: LASIX Take 1 tablet (20 mg total) by mouth daily.   loratadine 10 MG tablet Commonly known as: CLARITIN Take 10 mg by mouth daily as needed for allergies.   losartan 25 MG tablet Commonly known as: COZAAR Take 0.5 tablets (12.5 mg total) by mouth daily.   metoprolol succinate 25 MG 24 hr tablet Commonly known as: TOPROL-XL Take 1 tablet (25 mg total) by mouth daily. Take with or immediately following a meal.   predniSONE 20 MG tablet Commonly known as: DELTASONE Take 1  tablet (20 mg total) by mouth daily with breakfast for 1 day.   PreserVision AREDS 2 Caps Take 1 capsule by mouth 2 (two) times daily.   REFRESH OP Place 1 drop into both eyes 2 (two) times daily.   spironolactone 25 MG tablet Commonly known as: ALDACTONE Take 0.5 tablets (12.5 mg total) by mouth daily.   triamcinolone cream 0.1 % Commonly known as: KENALOG Apply 1 Application topically 2 (two) times daily.   Vitamin D3 50 MCG (2000 UT) Tabs Take 2,000 Units by mouth daily.        Follow-up Information     [facility]. Go in 5 day(s).   Specialty: Cardiology Why: Hospital follow up 11/08/2022 @ 3 pm PLEASE bring a current medication list to appointment FREE valet parking, [address], off [employer] information: [address] [place] [address] [phone]        Azalee Course, [place] Follow up on 11/21/2022.   Specialties: Cardiology, Radiology Why: 1:55PM. Cardiology  follow up Contact information: [address] [place] [address] [phone]         Joselyn Arrow, MD Follow up in 1 week(s).   Specialty: Family Medicine Contact information: [address] [place] [address] [phone]                Allergies  Allergen Reactions   Arimidex [Anastrozole] Rash   Avelox [Moxifloxacin Hcl In Nacl] Nausea Only and Rash   Azithromycin Nausea Only and Rash   Bactrim Nausea Only and Rash   Biaxin [Clarithromycin] Nausea Only and Rash   Nitrofurantoin Nausea Only, Rash and Other (See Comments)   Penicillins Nausea Only and Rash   Sulfa Antibiotics Nausea And Vomiting and Rash   Sulfasalazine Rash and Other (See Comments)    GI intolerance    Sulfur Nausea And Vomiting and Rash    You were cared for by a hospitalist during your hospital stay. If you have any questions about your discharge medications or the care you received while you were in the hospital  after you are discharged, you can call the unit and asked to speak with the hospitalist on call if the hospitalist that took care of you is not available. Once you are discharged, your primary care physician will handle any further medical issues. Please note that no refills for any discharge medications will be authorized once you are discharged, as it is imperative that you return to your primary care physician (or establish a relationship with a primary care physician if you do not have one) for your aftercare needs so that they can reassess your need for medications and monitor your lab values.  You were cared for by a hospitalist during your hospital stay. If you have any questions about your discharge medications or the care you received while you were in the hospital after you are discharged, you can call the unit and asked to speak with the hospitalist on call if the hospitalist that took care of you is not available. Once you are discharged, your primary care physician will handle any further medical issues. Please note that NO REFILLS for any discharge medications will be authorized once you are discharged, as it is imperative that you return to your primary care physician (or establish a relationship with a primary care physician if you do not have one) for your aftercare needs so that they can reassess your need for medications and monitor your lab values.  Please request your Prim.MD to go over all Hospital Tests and Procedure/Radiological results at the follow up, please get all Hospital records sent to your Prim MD by signing hospital release before you go home.  Get CBC, CMP, 2 view Chest X ray checked  by Primary MD during your next visit or SNF MD in 5-7 days ( we routinely change or add medications that can affect your baseline labs and fluid status, therefore we recommend that you get the mentioned basic workup next visit with your PCP, your PCP may decide not to get them or add new tests  based on their clinical decision)  On your next visit with your primary care physician please Get Medicines reviewed and adjusted.  If you experience worsening of your admission symptoms, develop shortness of breath, life threatening emergency, suicidal or homicidal thoughts you must seek medical attention immediately by calling 911 or calling your MD immediately  if symptoms less severe.  You Must read complete instructions/literature along with all the possible adverse reactions/side  effects for all the Medicines you take and that have been prescribed to you. Take any new Medicines after you have completely understood and accpet all the possible adverse reactions/side effects.   Do not drive, operate heavy machinery, perform activities at heights, swimming or participation in water activities or provide baby sitting services if your were admitted for syncope or siezures until you have seen by Primary MD or a Neurologist and advised to do so again.  Do not drive when taking Pain medications.   Procedures/Studies: ECHOCARDIOGRAM COMPLETE  Result Date: 10/31/2022    ECHOCARDIOGRAM REPORT   Patient Name:   TOMAS KODER Date of Exam: 10/31/2022 Medical Rec #:  [id number]   Height:       62.0 in Accession #:    [id number]  Weight:       122.8 lb Date of Birth:  May 10, 1926  BSA:          1.554 m Patient Age:    89 years    BP:           139/92 mmHg Patient Gender: F           HR:           60 bpm. Exam Location:  Inpatient Procedure: 2D Echo, Cardiac Doppler and Color Doppler Indications:    Congestive Heart Failure  History:        Patient has no prior history of Echocardiogram examinations.                 CHF, Signs/Symptoms:Dyspnea, Shortness of Breath and Edema; Risk                 Factors:Dyslipidemia and Hypertension. Hx cancer.  Sonographer:    Wallie Char Referring Phys: [id number] ALEXIS HUGELMEYER  Sonographer Comments: Image acquisition challenging due to uncooperative patient. Limited  interrogation of valves due to pt inability to tolerate exam and being touched IMPRESSIONS  1. Left ventricular ejection fraction, by estimation, is 45%. The left ventricle has mildly decreased function. The left ventricle has no regional wall motion abnormalities. Left ventricular diastolic parameters are indeterminate.  2. Right ventricular systolic function is normal. The right ventricular size is moderately enlarged.  3. Left atrial size was severely dilated.  4. The mitral valve is degenerative. No evidence of mitral valve regurgitation. Severe mitral annular calcification.  5. The aortic valve was not well visualized. Aortic valve regurgitation is not visualized.  6. The inferior vena cava is dilated in size with <50% respiratory variability, suggesting right atrial pressure of 15 mmHg.Full valve assessment limited by patients discomfort. Comparison(s): No prior Echocardiogram. FINDINGS  Left Ventricle: Left ventricular ejection fraction, by estimation, is 45%. The left ventricle has mildly decreased function. The left ventricle has no regional wall motion abnormalities. The left ventricular internal cavity size was normal in size. There is no left ventricular hypertrophy. Left ventricular diastolic parameters are indeterminate. Right Ventricle: The right ventricular size is moderately enlarged. No increase in right ventricular wall thickness. Right ventricular systolic function is normal. Left Atrium: Left atrial size was severely dilated. Right Atrium: Right atrial size was normal in size. Pericardium: There is no evidence of pericardial effusion. Mitral Valve: The mitral valve is degenerative in appearance. Severe mitral annular calcification. No evidence of mitral valve regurgitation. MV peak gradient, 4.2 mmHg. The mean mitral valve gradient is 1.0 mmHg. Tricuspid Valve: The tricuspid valve is normal in structure. Tricuspid valve regurgitation is mild . No evidence of tricuspid stenosis. Aortic Valve: The  aortic valve was not well visualized. Aortic valve regurgitation is not visualized. Aortic valve mean gradient measures 3.0 mmHg. Aortic valve peak gradient measures 4.9 mmHg. Aortic valve area, by VTI measures 2.25 cm. Pulmonic Valve: The pulmonic valve was not well visualized. Pulmonic valve regurgitation is not visualized. Aorta: The aortic root and ascending aorta are structurally normal, with no evidence of dilitation. Venous: The inferior vena cava is dilated in size with less than 50% respiratory variability, suggesting right atrial pressure of 15 mmHg. IAS/Shunts: The interatrial septum was not well visualized.  LEFT VENTRICLE PLAX 2D LVIDd:         4.80 cm     Diastology LVIDs:         3.60 cm     LV e' medial:    4.79 cm/s LV PW:         1.00 cm     LV E/e' medial:  21.7 LV IVS:        0.90 cm     LV e' lateral:   5.89 cm/s LVOT diam:     1.90 cm     LV E/e' lateral: 17.7 LV SV:         48 LV SV Index:   31 LVOT Area:     2.84 cm  LV Volumes (MOD) LV vol d, MOD A2C: 72.3 ml LV vol d, MOD A4C: 68.5 ml LV vol s, MOD A2C: 42.9 ml LV vol s, MOD A4C: 39.6 ml LV SV MOD A2C:     29.4 ml LV SV MOD A4C:     68.5 ml LV SV MOD BP:      29.8 ml RIGHT VENTRICLE            IVC RV Basal diam:  3.60 cm    IVC diam: 3.10 cm RV S prime:     7.34 cm/s TAPSE (M-mode): 1.4 cm LEFT ATRIUM             Index        RIGHT ATRIUM           Index LA diam:        3.70 cm 2.38 cm/m   RA Area:     14.10 cm LA Vol (A2C):   85.8 ml 55.22 ml/m  RA Volume:   37.30 ml  24.01 ml/m LA Vol (A4C):   81.5 ml 52.45 ml/m LA Biplane Vol: 85.2 ml 54.83 ml/m  AORTIC VALVE AV Area (Vmax):    1.99 cm AV Area (Vmean):   2.02 cm AV Area (VTI):     2.25 cm AV Vmax:           110.50 cm/s AV Vmean:          77.550 cm/s AV VTI:            0.213 m AV Peak Grad:      4.9 mmHg AV Mean Grad:      3.0 mmHg LVOT Vmax:         77.40 cm/s LVOT Vmean:        55.200 cm/s LVOT VTI:          0.169 m LVOT/AV VTI ratio: 0.79  AORTA Ao Root diam: 2.90 cm Ao Asc  diam:  2.70 cm MITRAL VALVE                TRICUSPID VALVE MV Area (PHT): 5.54 cm     TR Peak grad:   25.0 mmHg MV Area VTI:  1.76 cm     TR Vmax:        250.00 cm/s MV Peak grad:  4.2 mmHg MV Mean grad:  1.0 mmHg     SHUNTS MV Vmax:       1.02 m/s     Systemic VTI:  0.17 m MV Vmean:      54.1 cm/s    Systemic Diam: 1.90 cm MV Decel Time: 137 msec MV E velocity: 104.00 cm/s MV A velocity: 78.70 cm/s MV E/A ratio:  1.32 Riley Lam MD Electronically signed by Riley Lam MD Signature Date/Time: 10/31/2022/4:21:41 PM    Final    DG Chest 2 View  Result Date: 10/30/2022 CLINICAL DATA:  Shortness of breath with talking and ambulating, worse over the past 2 weeks. EXAM: CHEST - 2 VIEW COMPARISON:  10/26/2022 FINDINGS: Mild cardiac enlargement. No vascular congestion, edema, or consolidation. Small bilateral pleural effusions, greater on the right, similar to prior study. No pneumothorax. Mediastinal contours appear intact. Degenerative changes in the spine and shoulders. IMPRESSION: Small bilateral pleural effusions, greater on the right. Similar appearance to prior study. Electronically Signed   By: Burman Nieves M.D.   On: 10/30/2022 20:17   DG Chest Port 1 View  Result Date: 10/26/2022 CLINICAL DATA:  Shortness of breath EXAM: PORTABLE CHEST 1 VIEW COMPARISON:  03/21/2022 FINDINGS: There is hyperinflation of the lungs compatible with COPD. Heart and mediastinal contours within normal limits. Small right pleural effusion with right base atelectasis. Left lung clear. No acute bony abnormality. IMPRESSION: COPD. Small right pleural effusion with right base atelectasis. Electronically Signed   By: Charlett Nose M.D.   On: 10/26/2022 19:13     The results of significant diagnostics from this hospitalization (including imaging, microbiology, ancillary and laboratory) are listed below for reference.     Microbiology: No results found for this or any previous visit (from the past 240  hour(s)).   Labs: BNP (last 3 results) Recent Labs    10/26/22 1835 10/30/22 2040  BNP 539.2* 674.3*   Basic Metabolic Panel: Recent Labs  Lab 11/01/22 0457 11/02/22 0455 11/03/22 0428 11/03/22 1243 11/04/22 0524  NA 136 133* 126* 128* 131*  K 4.4 4.2 4.8 4.2 4.2  CL 101 97* 91* 92* 99  CO2 23 25 25 28 24   GLUCOSE 98 145* 107* 117* 92  BUN 18 28* 40* 44* 40*  CREATININE 0.61 1.06* 1.09* 1.20* 0.89  CALCIUM 8.8* 8.8* 9.0 8.7* 8.9  MG  --  2.1 2.3  --  2.4   Liver Function Tests: Recent Labs  Lab 10/31/22 0512 11/01/22 0457 11/02/22 0455 11/03/22 0428  AST 21 18 16 22   ALT 27 24 23 16   ALKPHOS 83 73 78 71  BILITOT 0.7 0.6 0.6 0.9  PROT 5.8* 5.8* 6.4* 6.4*  ALBUMIN 3.3* 3.1* 3.4* 3.2*   No results for input(s): "LIPASE", "AMYLASE" in the last 168 hours. No results for input(s): "AMMONIA" in the last 168 hours. CBC: Recent Labs  Lab 10/30/22 2040 10/31/22 0512 11/02/22 0455 11/03/22 0428 11/04/22 0524  WBC 6.8 5.9 5.4 8.4 6.2  NEUTROABS 4.7  --   --   --   --   HGB 13.1 12.1 12.2 12.2 12.1  HCT 41.6 38.2 39.7 37.2 37.7  MCV 89.7 90.7 91.1 88.6 89.1  PLT 240 226 250 242 238   Cardiac Enzymes: No results for input(s): "CKTOTAL", "CKMB", "CKMBINDEX", "TROPONINI" in the last 168 hours. BNP: Invalid input(s): "POCBNP" CBG: No results for  input(s): "GLUCAP" in the last 168 hours. D-Dimer No results for input(s): "DDIMER" in the last 72 hours. Hgb A1c No results for input(s): "HGBA1C" in the last 72 hours. Lipid Profile No results for input(s): "CHOL", "HDL", "LDLCALC", "TRIG", "CHOLHDL", "LDLDIRECT" in the last 72 hours. Thyroid function studies No results for input(s): "TSH", "T4TOTAL", "T3FREE", "THYROIDAB" in the last 72 hours.  Invalid input(s): "FREET3" Anemia work up No results for input(s): "VITAMINB12", "FOLATE", "FERRITIN", "TIBC", "IRON", "RETICCTPCT" in the last 72 hours. Urinalysis    Component Value Date/Time   COLORURINE YELLOW  03/22/2022 1137   APPEARANCEUR CLEAR 03/22/2022 1137   LABSPEC 1.015 03/22/2022 1137   PHURINE 5.0 03/22/2022 1137   GLUCOSEU NEGATIVE 03/22/2022 1137   HGBUR MODERATE (A) 03/22/2022 1137   BILIRUBINUR NEGATIVE 03/22/2022 1137   BILIRUBINUR negative 12/25/2020 1629   BILIRUBINUR neg 04/10/2016 1442   KETONESUR 5 (A) 03/22/2022 1137   PROTEINUR 100 (A) 03/22/2022 1137   UROBILINOGEN 0.2 12/25/2020 1629   UROBILINOGEN 0.2 02/22/2013 0447   NITRITE NEGATIVE 03/22/2022 1137   LEUKOCYTESUR MODERATE (A) 03/22/2022 1137   Sepsis Labs Recent Labs  Lab 10/31/22 0512 11/02/22 0455 11/03/22 0428 11/04/22 0524  WBC 5.9 5.4 8.4 6.2   Microbiology No results found for this or any previous visit (from the past 240 hour(s)).   Time coordinating discharge:  I have spent 35 minutes face to face with the patient and on the ward discussing the patients care, assessment, plan and disposition with other care givers. >50% of the time was devoted counseling the patient about the risks and benefits of treatment/Discharge disposition and coordinating care.   SIGNED:   Miguel Rota, MD  [facility] 11/04/2022, 11:51 AM   If 7PM-7AM, please contact night-coverage

## 2022-11-04 NOTE — Progress Notes (Signed)
Patient and her daughter given discharge, medication, and follow up instructions, verbalized understanding< IV and telemetry monitor removed, personal belongings with patient, family to transport home

## 2022-11-05 NOTE — Progress Notes (Signed)
Heart Failure Nurse Navigator Progress Note  PCP: Joselyn Arrow, MD PCP-Cardiologist: Dr. Mayford Knife Admission Diagnosis: Heart Failure Admitted from: Home  Presentation:   Mary [name] presented with Shortness of breath with talking and ambulating. CXR with effusions, elevated BNP 674, and mildly elevated troponin's. Patient reported to recently her legs feeling very heavy, being fatigued and experiencing weakness. BP 191/103, HR 93, IV lasix given. Patient with a chronic foley.   Called and spoke with patients daughter Consepcion Hearing) at the request of Dr. Mayford Knife for a Hf TOC appointment and labs prior to her visit with [facility]. Daughter was educated on the sign and symptoms of heart failure, daily weights, diet/ fluid restrictions and taking all her medications as prescribed and attending all medical appointments. Patients daughter reported that patient has a aide in her home every day that helps her with tasks and diet, as well as medications. Her brother will be bringing patient to her HF TOC appointment 11/08/2022 @ 3 pm.   ECHO/ LVEF: 45%  Clinical Course:  Past Medical History:  Diagnosis Date   Alopecia    Ashkenazi Jewish ancestry    [facility]cer [facility])    External hemorrhoid    Foley catheter in place    Heart palpitations no cardiologist--  monitored by pcp   per pt "has been on verapamil and lanoxin for years and no palpitations for a very long time"   History of acute bronchitis    dx 12-21-2014  finished [facility]PAK   History of adenomatous polyp of colon    11/19/2001   History of basal cell carcinoma excision    11/19/2004  left nasolabial fold   History of benign colon tumor    11/19/2005  --  HIGH GRADE HYPERPLASTIC ,  S/P  LEFT HEMICOLECTOMY   History of breast cancer ONCOLOGIST-  DR Darnelle Catalan--  antiestogen therapy with femera completed 12/  November 20, 2010--  no recurrence   dx 10/  2005/11/19  right breast Invasive DCIS, grade 2, Stage 2A, pT2  pN0 pMX,  (ER 100% /PR 3% +),  HER +3) with 1 metastatic axill node---   s/p  right mastectomy    History of kidney stones    Hypertension    Hypotonic bladder UROLOGIST-  DR Patsi Sears   Mixed hyperlipidemia    OA (osteoarthritis)    hip   Osteoporosis    DEXA 01/2009; T-3.2 L fem neck, -2.4 L radius   Urinary retention    02-13-2015     Social History   Socioeconomic History   Marital status: Widowed    Spouse name: Not on file   Number of children: 3   Years of education: Not on file   Highest education level: Not on file  Occupational History   Not on file  Tobacco Use   Smoking status: Never   Smokeless tobacco: Never  Vaping Use   Vaping status: Never Used  Substance and Sexual Activity   Alcohol use: Never   Drug use: No   Sexual activity: Not Currently    Birth control/protection: Post-menopausal  Other Topics Concern   Not on file  Social History Narrative   Lives alone, no pets.   Children live in [place] and [place]. She has 1 son and 2 daughters; nephew lives in [place].   Widowed 11-19-1992   Her brother passed away (had moved to [place] same time she did)      She no longer drives.   Has caregiver Zella Ball during the week (mornings, sometimes  until 4), somebody on Saturday and Sunday evenings (sometimes).      Updated 07/2026   Social Determinants of Health   Financial Resource Strain: Not on file  Food Insecurity: No Food Insecurity (10/31/2022)   Hunger Vital Sign    Worried About Running Out of Food in the Last Year: Never true    Ran Out of Food in the Last Year: Never true  Transportation Needs: No Transportation Needs (11/03/2022)   PRAPARE - Administrator, Civil Service (Medical): No    Lack of Transportation (Non-Medical): No  Physical Activity: Not on file  Stress: Not on file  Social Connections: Not on file    High Risk Criteria for Readmission and/or Poor Patient Outcomes: Heart failure hospital admissions (last 6 months): 1  No Show rate: 1% Difficult social situation: No Demonstrates medication adherence:  Yes Primary Language: English Literacy level: Reading, writing, and comprehension  Barriers of Care:   Continued HF education Diet/ fluid restrictions Daily weights  Considerations/Referrals:   Referral made to Heart Failure Pharmacist Stewardship: No Referral made to Heart Failure CSW/NCM TOC: No Referral made to Heart & Vascular TOC clinic: Yes, per Dr. Malachy Mood request , Hf TOC 11/08/2022 @ 3 pm.   Items for Follow-up on DC/TOC: Continued HF education Diet/ fluid restrictions   Rhae Hammock, BSN, RN Heart Failure Teacher, adult education Only

## 2022-11-06 ENCOUNTER — Telehealth: Payer: Self-pay | Admitting: Internal Medicine

## 2022-11-06 ENCOUNTER — Telehealth: Payer: Self-pay

## 2022-11-06 NOTE — Telephone Encounter (Signed)
She has TOC visit with me 8/22. Start with just PT. We can evaluate what her needs are when she comes for visit

## 2022-11-06 NOTE — Transitions of Care (Post Inpatient/ED Visit) (Signed)
11/06/2022  Name: Mary [name] MRN: [id number] DOB: [birth year]  Today's TOC FU Call Status: Today's TOC FU Call Status:: Successful TOC FU Call Completed TOC FU Call Complete Date: 11/06/22  Transition Care Management Follow-up Telephone Call Date of Discharge: 11/04/22 Discharge Facility: [facility]) Type of Discharge: Inpatient Admission Primary Inpatient Discharge Diagnosis:: "HF,unspecified" How have you been since you were released from the hospital?: Better Any questions or concerns?: No  Items Reviewed: Did you receive and understand the discharge instructions provided?: Yes Medications obtained,verified, and reconciled?: No Medications Not Reviewed Reasons:: Other: (pt states daughtter [employer] currently in the home-advised to call back if any questions/concerns with meds-son will review med list with sister and confirm taking as prescribed) Any new allergies since your discharge?: No Dietary orders reviewed?: Yes Type of Diet Ordered:: low salt/heart healthy Do you have support at home?: Yes People in Home: child(ren), adult Name of Support/Comfort Primary Source: daughters and son helping pt out  Medications Reviewed Today: Medications Reviewed Today     Reviewed by Charlyn Minerva, RN (Registered Nurse) on 11/06/22 at 1423  Med List Status: <None>   Medication Order Taking? Sig Documenting Provider Last Dose Status Informant  acetaminophen (TYLENOL) 500 MG tablet [id number] No Take 1,000 mg by mouth 2 (two) times daily at 8 am and 10 pm. [provider] 10/30/2022 Active Self, Pharmacy Records           Med Note (CRUTHIS, CHLOE C   Tue Oct 31, 2022 10:03 AM)    atorvastatin (LIPITOR) 40 MG tablet [id number] No TAKE 1 TABLET(40 MG) BY MOUTH DAILY Joselyn Arrow, MD 10/30/2022 Active Self, Pharmacy Records  bisacodyl (DULCOLAX) 5 MG EC tablet [id number] No Take 5 mg by mouth daily as needed for moderate constipation. [provider] Past Week Active Self, Pharmacy Records           Med Note Epimenio Sarin, CHLOE C   Tue Oct 31, 2022 10:03 AM)    Cholecalciferol (VITAMIN D3) 50 MCG (2000 UT) TABS [id number] No Take 2,000 Units by mouth daily. Magrinat, Valentino Hue, MD 10/30/2022 Active Self, Pharmacy Records  denosumab (PROLIA) 60 MG/ML SOLN injection [id number] No Inject 60 mg into the skin every 6 (six) months. Administer in upper arm, thigh, or abdomen [provider] unknown Active Self, Pharmacy Records           Med Note Sherrie Mustache, [place] A   Thu Aug 25, 2021  1:29 PM)    diazepam (VALIUM) 2 MG tablet [id number] No TAKE 1 TABLET(2 MG) BY MOUTH EVERY 8 HOURS AS NEEDED FOR MUSCLE SPASMS Joselyn Arrow, MD Past Month Active Self, Pharmacy Records  empagliflozin (JARDIANCE) 10 MG TABS tablet [id number]  Take 1 tablet (10 mg total) by mouth daily. Amin, Ankit C, MD  Active   fluticasone (CUTIVATE) 0.005 % ointment [id number] No Apply 1 Application topically 2 (two) times daily. [provider] Past Week Active Self, Pharmacy Records           Med Note (CRUTHIS, CHLOE C   Tue Oct 31, 2022 10:04 AM)    furosemide (LASIX) 20 MG tablet [id number]  Take 1 tablet (20 mg total) by mouth daily. Miguel Rota, MD  Active   loratadine (CLARITIN) 10 MG tablet [id number] No Take 10 mg by mouth daily as needed for allergies. [provider] Past Week Active Self, Pharmacy Records           Med Note (CRUTHIS, CHLOE C  Tue Oct 31, 2022 10:04 AM)    losartan (COZAAR) 25 MG tablet [id number]  Take 0.5 tablets (12.5 mg total) by mouth daily. Amin, Ankit C, MD  Active   metoprolol succinate (TOPROL-XL) 25 MG 24 hr tablet [id number]  Take 1 tablet (25 mg total) by mouth daily. Take with or immediately following a meal. Amin, Ankit C, MD  Active   Multiple Vitamins-Minerals (PRESERVISION AREDS 2) CAPS [id number] No Take 1 capsule by mouth 2 (two) times daily. [provider] 10/30/2022 Active Self, Pharmacy Records  Polyvinyl  Alcohol-Povidone [facility] OP) [id number] No Place 1 drop into both eyes 2 (two) times daily. [provider] 10/30/2022 Active Self, Pharmacy Records  spironolactone (ALDACTONE) 25 MG tablet [id number]  Take 0.5 tablets (12.5 mg total) by mouth daily. Amin, Ankit C, MD  Active   triamcinolone cream (KENALOG) 0.1 % [id number] No Apply 1 Application topically 2 (two) times daily. Joselyn Arrow, MD Past Week Active Self, Pharmacy Records           Med Note (CRUTHIS, Marcy Siren   Tue Oct 31, 2022 10:04 AM)              Home Care and Equipment/Supplies: Were Home Health Services Ordered?: Yes [facility] Has Agency set up a time to come to your home?: No (Son is not sure as he states he just got in from [place]-will check with his sister-advised to follow up with agency if no call from them within 48-72hrs post discharge) Any new equipment or medical supplies ordered?: NA  Functional Questionnaire: Do you need assistance with bathing/showering or dressing?: Yes Do you need assistance with meal preparation?: Yes Do you need assistance with eating?: No Do you have difficulty maintaining continence: Yes Do you need assistance with getting out of bed/getting out of a chair/moving?: Yes Do you have difficulty managing or taking your medications?: Yes  Follow up appointments reviewed: PCP Follow-up appointment confirmed?: Yes Date of PCP follow-up appointment?: 11/09/22 Follow-up Provider: Dr. Lynelle Doctor Specialist [facility] Follow-up appointment confirmed?: Yes Date of Specialist follow-up appointment?: 11/08/22 Follow-Up Specialty Provider:: HF Clinic Do you need transportation to your follow-up appointment?: No Do you understand care options if your condition(s) worsen?: Yes-patient verbalized understanding  SDOH Interventions Today    Flowsheet Row Most Recent Value  SDOH Interventions   Food Insecurity Interventions Intervention Not Indicated  Transportation  Interventions Intervention Not Indicated      TOC Interventions Today    Flowsheet Row Most Recent Value  TOC Interventions   TOC Interventions Discussed/Reviewed TOC Interventions Discussed      Interventions Today    Flowsheet Row Most Recent Value  Chronic Disease   Chronic disease during today's visit Congestive Heart Failure (CHF)  General Interventions   General Interventions Discussed/Reviewed General Interventions Discussed, Doctor Visits  Doctor Visits Discussed/Reviewed Doctor Visits Discussed, PCP, Specialist  PCP/Specialist Visits Compliance with follow-up visit  Education Interventions   Education Provided Provided Education  Provided Verbal Education On Nutrition, Medication, When to see the doctor  Nutrition Interventions   Nutrition Discussed/Reviewed Nutrition Discussed, Adding fruits and vegetables  Pharmacy Interventions   Pharmacy Dicussed/Reviewed Pharmacy Topics Discussed, Medications and their functions  Safety Interventions   Safety Discussed/Reviewed Safety Discussed, Home Safety       Alessandra Grout [facility] Care Management Care Management Community Coordinator Direct Phone: [phone] Toll Free: [phone] Fax: [phone]

## 2022-11-06 NOTE — Telephone Encounter (Signed)
Chelsea with [facility] called and state that ER put in home health PT referral but they would like a nurse to go out to access her as well for any needs she may need. Advised you will not be in office until Wednesday and we would check to see if it was ok. They will start with PT first.   Leeroy Bock [phone]

## 2022-11-07 NOTE — Telephone Encounter (Signed)
[facility] was notified

## 2022-11-08 ENCOUNTER — Ambulatory Visit (HOSPITAL_COMMUNITY)
Admit: 2022-11-08 | Discharge: 2022-11-08 | Disposition: A | Discharge status: Home / Self Care | Payer: Medicare Other | Source: Ambulatory Visit | Attending: Adult Health | Admitting: Adult Health

## 2022-11-08 ENCOUNTER — Encounter (HOSPITAL_COMMUNITY): Payer: Self-pay

## 2022-11-08 ENCOUNTER — Other Ambulatory Visit: Payer: Self-pay | Admitting: Family Medicine

## 2022-11-08 ENCOUNTER — Telehealth: Payer: Self-pay | Admitting: Family Medicine

## 2022-11-08 VITALS — BP 120/70 | HR 59 | Wt 119.0 lb

## 2022-11-08 DIAGNOSIS — I10 Essential (primary) hypertension: Secondary | ICD-10-CM | POA: Diagnosis not present

## 2022-11-08 DIAGNOSIS — Z9359 Other cystostomy status: Secondary | ICD-10-CM

## 2022-11-08 DIAGNOSIS — I110 Hypertensive heart disease with heart failure: Secondary | ICD-10-CM | POA: Diagnosis not present

## 2022-11-08 DIAGNOSIS — I509 Heart failure, unspecified: Secondary | ICD-10-CM

## 2022-11-08 DIAGNOSIS — R339 Retention of urine, unspecified: Secondary | ICD-10-CM | POA: Insufficient documentation

## 2022-11-08 DIAGNOSIS — R0609 Other forms of dyspnea: Secondary | ICD-10-CM

## 2022-11-08 DIAGNOSIS — I5022 Chronic systolic (congestive) heart failure: Secondary | ICD-10-CM | POA: Insufficient documentation

## 2022-11-08 DIAGNOSIS — R42 Dizziness and giddiness: Secondary | ICD-10-CM | POA: Insufficient documentation

## 2022-11-08 LAB — BASIC METABOLIC PANEL
Anion gap: 10 (ref 5–15)
BUN: 20 mg/dL (ref 8–23)
CO2: 29 mmol/L (ref 22–32)
Calcium: 9.1 mg/dL (ref 8.9–10.3)
Chloride: 94 mmol/L — ABNORMAL LOW (ref 98–111)
Creatinine, Ser: 0.99 mg/dL (ref 0.44–1.00)
GFR, Estimated: 53 mL/min — ABNORMAL LOW (ref >60)
Glucose, Bld: 131 mg/dL — ABNORMAL HIGH (ref 70–99)
Potassium: 4.3 mmol/L (ref 3.5–5.1)
Sodium: 133 mmol/L — ABNORMAL LOW (ref 135–145)

## 2022-11-08 MED ORDER — FUROSEMIDE 20 MG PO TABS: 20.0000 mg | ORAL_TABLET | ORAL | 0 refills | Status: DC

## 2022-11-08 NOTE — Progress Notes (Signed)
ReDS Vest / Clip - 11/08/22 1500       ReDS Vest / Clip   Station Marker A    Ruler Value 23    ReDS Value Range Low volume    ReDS Actual Value 25

## 2022-11-08 NOTE — Progress Notes (Unsigned)
No chief complaint on file.  Patient presents for hospital follow-up.  Hospitalized 8/12-8/17 with shortness of breath. She was found to be volume overloaded, diagnosed with CHF exacerbation. Cardiology was consulted. Echocardiogram showed decreased EF of 45% IMPRESSION:  1. Left ventricular ejection fraction, by estimation, is 45%. The left ventricle has mildly decreased function. The left ventricle has no  regional wall motion abnormalities. Left ventricular diastolic parameters are indeterminate.   2. Right ventricular systolic function is normal. The right ventricular size is moderately enlarged.   3. Left atrial size was severely dilated.   4. The mitral valve is degenerative. No evidence of mitral valve regurgitation. Severe mitral annular calcification.   5. The aortic valve was not well visualized. Aortic valve regurgitation is not visualized.   6. The inferior vena cava is dilated in size with <50% respiratory variability, suggesting right atrial pressure of 15 mmHg.Full valve  assessment limited by patients discomfort.   CXR 8/12: IMPRESSION: Small bilateral pleural effusions, greater on the right. Similar appearance to prior study.   Meds were changed in the hospital to toprol, losartan, lasix, aldactone and jardiance. Calan was stopped.  She saw cardiology yesterday for f/u. She reported some dizziness, and was felt that she might be over-diuresed. Weight was down 7# from discharge. She was no longer feeling short of breath. Lasix dose was decreased to 20 mg twice a week (Monday and Friday).  Jardiance was stopped. She is to continue Toprol XL 25mg  daily, Losartan 12.5mg  daily, and spironolactone.  If she has ongoing dizziness, the spironolactone may need to be stopped.  She  mentioned concern to cardiologist about possible UTI or kidney stones, reported seeing some small white stones when emptying catheter.  Urine wasn't checked. She had appointment with urologist  TODAY???***     Recommendations for Outpatient Follow-up:  Follow up with PCP in 1-2 weeks Please obtain BMP/CBC in one week your next doctors visit. --B-MET DONE BY CARDIOLOGIST YESTERDAY Patient started on cardiac medications-Toprol, losartan, Lasix, Aldactone, Jardiance.  Outpatient follow-up with cardiology.     PMH, PSH, SH reviewed    ROS:   PHYSICAL EXAM:  LMP  (LMP Unknown)   Wt Readings from Last 3 Encounters:  11/08/22 119 lb (54 kg)  11/04/22 126 lb 15.8 oz (57.6 kg)  10/26/22 121 lb 0.5 oz (54.9 kg)        Chemistry      Component Value Date/Time   NA 133 (L) 11/08/2022 1602   NA 140 07/26/2022 1009   NA 141 10/03/2016 1107   K 4.3 11/08/2022 1602   K 4.5 10/03/2016 1107   CL 94 (L) 11/08/2022 1602   CL 102 03/01/2012 0912   CO2 29 11/08/2022 1602   CO2 32 (H) 10/03/2016 1107   BUN 20 11/08/2022 1602   BUN 24 07/26/2022 1009   BUN 11.7 10/03/2016 1107   CREATININE 0.99 11/08/2022 1602   CREATININE 0.78 08/07/2022 1104   CREATININE 0.8 10/03/2016 1107      Component Value Date/Time   CALCIUM 9.1 11/08/2022 1602   CALCIUM 10.6 (H) 10/03/2016 1107   ALKPHOS 71 11/03/2022 0428   ALKPHOS 81 10/03/2016 1107   AST 22 11/03/2022 0428   AST 14 (L) 08/07/2022 1104   AST 17 10/03/2016 1107   ALT 16 11/03/2022 0428   ALT 11 08/07/2022 1104   ALT 16 10/03/2016 1107   BILITOT 0.9 11/03/2022 0428   BILITOT 0.6 08/07/2022 1104   BILITOT 0.63 10/03/2016 1107  ASSESSMENT/PLAN:  Cbc, b-met--b-met was done yesterday by cardiology. ?need for cbc??  Did she see urologist today?  Referral placed yesterday for chronic care management and SW-- Okay for order for [facility] evaluation--PLEASE ENTER (SEE PHONE CALL).

## 2022-11-08 NOTE — Progress Notes (Addendum)
HEART & VASCULAR TRANSITION OF CARE CONSULT NOTE     Referring Physician: Dr Nelson Chimes Primary Care : Dr Lynelle Doctor  Primary Cardiologist: Dr Allyson Sabal   HPI: Referred to clinic by dr Nelson Chimes for heart failure consultation.   Ms Mary [name] is a 89 year old with a history of HTN, paroxysmyal atrial tach, HLD, breast cancer/mastectomy, osteoporosis, urinary retention with suprapubic catheter, and HFmEF.   Admitted 10/30/22 with acute heart failure exacerbation. Echo showed milyd reduced EF 45%.  Diuresed with IV lasix. Started GDMIT with 4/4 HF pillars. Discharge weight 127 pounds. D/C date 11/04/22.   Today she presents with her son. She is concerned she may have a UTI or kidney stones. Reports some small white stones when emptying catheter. Having some dizziness since discharge.  No longer short of breath. Denies SOB/PND/Orthopnea. Walking with a walker in her home. Appetite ok. No fever or chills. Taking all medications. Lives alone but has caregivers. Her son will be with her for next few days but plans to return to [place].   REDS Clip:  25%   Cardiac Testing   1. Left ventricular ejection fraction, by estimation, is 45%. The left  ventricle has mildly decreased function. The left ventricle has no  regional wall motion abnormalities. Left ventricular diastolic parameters  are indeterminate.   2. Right ventricular systolic function is normal. The right ventricular  size is moderately enlarged.   3. Left atrial size was severely dilated.   4. The mitral valve is degenerative. No evidence of mitral valve  regurgitation. Severe mitral annular calcification.   5. The aortic valve was not well visualized. Aortic valve regurgitation  is not visualized.   Review of Systems: [y] = yes, [ ]  = no   General: Weight gain [ ] ; Weight loss [Y ]; Anorexia [ ] ; Fatigue [ ] ; Fever [ ] ; Chills [ ] ; Weakness [ ]   Cardiac: Chest pain/pressure [ ] ; Resting SOB [ ] ; Exertional SOB [ ] ; Orthopnea [ ] ; Pedal Edema [ ] ;  Palpitations [ ] ; Syncope [ ] ; Presyncope [ ] ; Paroxysmal nocturnal dyspnea[ ]   Pulmonary: Cough [ ] ; Wheezing[ ] ; Hemoptysis[ ] ; Sputum [ ] ; Snoring [ ]   GI: Vomiting[ ] ; Dysphagia[ ] ; Melena[ ] ; Hematochezia [ ] ; Heartburn[ ] ; Abdominal pain [ ] ; Constipation [ ] ; Diarrhea [ ] ; BRBPR [ ]   GU: Hematuria[ ] ; Dysuria [ ] ; Nocturia[ ]   Vascular: Pain in legs with walking [ ] ; Pain in feet with lying flat [ ] ; Non-healing sores [ ] ; Stroke [ ] ; TIA [ ] ; Slurred speech [ ] ;  Neuro: Headaches[ ] ; Vertigo[ ] ; Seizures[ ] ; Paresthesias[ ] ;Blurred vision [ ] ; Diplopia [ ] ; Vision changes [ ]   Ortho/Skin: Arthritis [ ] ; Joint pain [Y ]; Muscle pain [ ] ; Joint swelling [ ] ; Back Pain [ Y]; Rash [ ]   Psych: Depression[ ] ; Anxiety[ ]   Heme: Bleeding problems [ ] ; Clotting disorders [ ] ; Anemia [ ]   Endocrine: Diabetes [ ] ; Thyroid dysfunction[ ]    Past Medical History:  Diagnosis Date   Alopecia    Ashkenazi Jewish ancestry    Cancer [facility])    External hemorrhoid    Foley catheter in place    Heart palpitations no cardiologist--  monitored by pcp   per pt "has been on verapamil and lanoxin for years and no palpitations for a very long time"   History of acute bronchitis    dx 12-21-2014  finished ZPAK   History of adenomatous polyp of colon  2003   History of basal cell carcinoma excision    2006  left nasolabial fold   History of benign colon tumor    2007  --  HIGH GRADE HYPERPLASTIC ,  S/P  LEFT HEMICOLECTOMY   History of breast cancer ONCOLOGIST-  DR Darnelle Catalan--  antiestogen therapy with femera completed 12/  2012--  no recurrence   dx 10/  2007  right breast Invasive DCIS, grade 2, Stage 2A, pT2  pN0 pMX,  (ER 100% /PR 3% +),  HER +3) with 1 metastatic axill node---  s/p  right mastectomy    History of kidney stones    Hypertension    Hypotonic bladder UROLOGIST-  DR Patsi Sears   Mixed hyperlipidemia    OA (osteoarthritis)    hip   Osteoporosis    DEXA 01/2009; T-3.2 L fem neck, -2.4 L  radius   Urinary retention    02-13-2015    Current Outpatient Medications  Medication Sig Dispense Refill   acetaminophen (TYLENOL) 500 MG tablet Take 1,000 mg by mouth 2 (two) times daily at 8 am and 10 pm.     atorvastatin (LIPITOR) 40 MG tablet TAKE 1 TABLET(40 MG) BY MOUTH DAILY 90 tablet 3   bisacodyl (DULCOLAX) 5 MG EC tablet Take 5 mg by mouth daily as needed for moderate constipation.     Cholecalciferol (VITAMIN D3) 50 MCG (2000 UT) TABS Take 2,000 Units by mouth daily. 100 tablet 4   denosumab (PROLIA) 60 MG/ML SOLN injection Inject 60 mg into the skin every 6 (six) months. Administer in upper arm, thigh, or abdomen     diazepam (VALIUM) 2 MG tablet TAKE 1 TABLET(2 MG) BY MOUTH EVERY 8 HOURS AS NEEDED FOR MUSCLE SPASMS 30 tablet 0   empagliflozin (JARDIANCE) 10 MG TABS tablet Take 1 tablet (10 mg total) by mouth daily. 30 tablet 0   fluticasone (CUTIVATE) 0.005 % ointment Apply 1 Application topically 2 (two) times daily.     furosemide (LASIX) 20 MG tablet Take 1 tablet (20 mg total) by mouth daily. 30 tablet 0   loratadine (CLARITIN) 10 MG tablet Take 10 mg by mouth daily as needed for allergies.     losartan (COZAAR) 25 MG tablet Take 0.5 tablets (12.5 mg total) by mouth daily. 30 tablet 0   metoprolol succinate (TOPROL-XL) 25 MG 24 hr tablet Take 1 tablet (25 mg total) by mouth daily. Take with or immediately following a meal. 30 tablet 0   Multiple Vitamins-Minerals (PRESERVISION AREDS 2) CAPS Take 1 capsule by mouth 2 (two) times daily.     Polyvinyl Alcohol-Povidone (REFRESH OP) Place 1 drop into both eyes 2 (two) times daily.     spironolactone (ALDACTONE) 25 MG tablet Take 0.5 tablets (12.5 mg total) by mouth daily. 30 tablet 0   triamcinolone cream (KENALOG) 0.1 % Apply 1 Application topically 2 (two) times daily. 30 g 0   No current facility-administered medications for this encounter.    Allergies  Allergen Reactions   Arimidex [Anastrozole] Rash   Avelox  [Moxifloxacin Hcl In Nacl] Nausea Only and Rash   Azithromycin Nausea Only and Rash   Bactrim Nausea Only and Rash   Biaxin [Clarithromycin] Nausea Only and Rash   Nitrofurantoin Nausea Only, Rash and Other (See Comments)   Penicillins Nausea Only and Rash   Sulfa Antibiotics Nausea And Vomiting and Rash   Sulfasalazine Rash and Other (See Comments)    GI intolerance    Sulfur Nausea And Vomiting and Rash  Social History   Socioeconomic History   Marital status: Widowed    Spouse name: Not on file   Number of children: 3   Years of education: Not on file   Highest education level: Not on file  Occupational History   Not on file  Tobacco Use   Smoking status: Never   Smokeless tobacco: Never  Vaping Use   Vaping status: Never Used  Substance and Sexual Activity   Alcohol use: Never   Drug use: No   Sexual activity: Not Currently    Birth control/protection: Post-menopausal  Other Topics Concern   Not on file  Social History Narrative   Lives alone, no pets.   Children live in [place] and [place]. She has 1 son and 2 daughters; nephew lives in [place].   Widowed Nov 27, 1992   Her brother passed away (had moved to [place] same time she did)      She no longer drives.   Has caregiver Zella Ball during the week (mornings, sometimes until 4), somebody on Saturday and 11/28/22 evenings (sometimes).      Updated 07/2026   Social Determinants of Health   Financial Resource Strain: Not on file  Food Insecurity: No Food Insecurity (11/06/2022)   Hunger Vital Sign    Worried About Running Out of Food in the Last Year: Never true    Ran Out of Food in the Last Year: Never true  Transportation Needs: No Transportation Needs (11/06/2022)   PRAPARE - Administrator, Civil Service (Medical): No    Lack of Transportation (Non-Medical): No  Physical Activity: Not on file  Stress: Not on file  Social Connections: Not on file  Intimate Partner Violence: Not At Risk (10/31/2022)    Humiliation, Afraid, Rape, and Kick questionnaire    Fear of Current or Ex-Partner: No    Emotionally Abused: No    Physically Abused: No    Sexually Abused: No      Family History  Problem Relation Age of Onset   Hypertension Mother    Stroke Father    Diabetes Father    Hyperlipidemia Brother    Atrial fibrillation Brother    Hyperlipidemia Son    Breast cancer Paternal Aunt        dx 60s; deceased early 41s   Breast cancer Cousin        paternal first cousin   Colon cancer Cousin        paternal first cousin; age dx 10s    Vitals:   11/08/22 1530  BP: 120/70  Pulse: (!) 59  SpO2: 94%  Weight: 54 kg (119 lb)   Wt Readings from Last 3 Encounters:  11/08/22 54 kg (119 lb)  11/04/22 57.6 kg (126 lb 15.8 oz)  10/26/22 54.9 kg (121 lb 0.5 oz)    PHYSICAL EXAM: General:  Arrived in a wheel chair. No respiratory difficulty HEENT: normal Neck: supple. no JVD. Carotids 2+ bilat; no bruits. No lymphadenopathy or thryomegaly appreciated. Cor: PMI nondisplaced. Regular rate & rhythm. No rubs, gallops or murmurs. Lungs: clear Abdomen: soft, nontender, nondistended. No hepatosplenomegaly. No bruits or masses. Good bowel sounds. Extremities: no cyanosis, clubbing, rash, edema Neuro: alert & oriented x 3, cranial nerves grossly intact. moves all 4 extremities w/o difficulty. Affect pleasant. GU: Suprapubic Catheter. Dressing intact.   Reds Clip 25%.   ASSESSMENT & PLAN: 1. Chronic HFmEF Recently diagnosed with HFmEF. Echo EF 45% with no WMA and  RV normal. Cath was not pursued. HS  Trop with no trend.   NYHA II but functionally limited due to back pain. On exam she appears dry. Reds Clip 25%. GDMT  Diuretic- Concerned she is over diuresed.  Will need to cut back lasix to twice weekly. Monday and Friday and stop jardiance.   BB-Continue Toprol XL 25 mg daily  Ace/ARB/ARNI-Continue losartan 12.5 mg daily . Would not switch to ARNi due to concern for hypotension and diuresis  effect.  MRA- Continue 12.5 mg spironolactone daily  SGLT2i- Stop Jardiance. Concerned about UTI with suprapubic catheter. She has f/u with Urology tomorrow.  Check BMET   2. HTN  Stable.   3. Urinary Retention.  She has follow up with Urology.  4. Dizzy  Suspect dizziness is due to over diuresis. As above changing lasix to twice weekly and stopping jardiance.  May need to stop spironolactone if dizziness persists.     Referred to HFSW (PCP, Medications, Transportation, ETOH Abuse, Drug Abuse, Insurance, Financial ):  No Refer to Pharmacy: No Refer to Home Health: Has follow up with PCP. Suspect she will need HH.  Refer to Advanced Heart Failure Clinic: NO   Refer to General Cardiology: She is establishing with Dr Allyson Sabal.   Discussed a Life Alert system. Her family discussed with her but she did not want pursue. Concerned she will need additional care at home once her son returns to [place].   Follow up as needed.   Cynthis Purington NP-C  3:32 PM

## 2022-11-08 NOTE — Telephone Encounter (Signed)
Spoke with Lupita Leash and answered her questions.

## 2022-11-08 NOTE — Patient Instructions (Addendum)
RedsClip done today.   Labs done today. We will contact you only if your labs are abnormal.  STOP taking Jardiance  CHANGE Lasix to 20mg  (1 tablet) by mouth 2 times weekly.   No other medication changes were made. Please continue all current medications as prescribed.  Thank you for allowing [place] to provide your heart failure care after your recent hospitalization. Please follow-up with General Cardiology.

## 2022-11-08 NOTE — Telephone Encounter (Signed)
Lupita Leash called and asks if you can give her a call, she has some questions about Clar[facility] visit for tomorrow.

## 2022-11-09 ENCOUNTER — Ambulatory Visit (INDEPENDENT_AMBULATORY_CARE_PROVIDER_SITE_OTHER): Payer: Medicare Other | Admitting: Family Medicine

## 2022-11-09 ENCOUNTER — Telehealth: Payer: Self-pay | Admitting: *Deleted

## 2022-11-09 ENCOUNTER — Encounter: Payer: Self-pay | Admitting: Family Medicine

## 2022-11-09 VITALS — BP 124/84 | HR 68 | Ht 62.0 in | Wt 119.4 lb

## 2022-11-09 DIAGNOSIS — Z9359 Other cystostomy status: Secondary | ICD-10-CM | POA: Diagnosis not present

## 2022-11-09 DIAGNOSIS — N210 Calculus in bladder: Secondary | ICD-10-CM | POA: Diagnosis not present

## 2022-11-09 DIAGNOSIS — R002 Palpitations: Secondary | ICD-10-CM

## 2022-11-09 DIAGNOSIS — N312 Flaccid neuropathic bladder, not elsewhere classified: Secondary | ICD-10-CM | POA: Diagnosis not present

## 2022-11-09 DIAGNOSIS — I5022 Chronic systolic (congestive) heart failure: Secondary | ICD-10-CM | POA: Diagnosis not present

## 2022-11-09 NOTE — Assessment & Plan Note (Signed)
New dx of CHF during recent hospitalization.  Was a little over-diuresed.  Lasix dose was cut back to 2x/week from daily, still on spironolactone. Weight is unchanged today, but pt reports feeling better, less dizzy. She thinks she feels better since stopping the Jardiance per cardiologist yesterday.. Cont current meds. Discussed daily weights, and contacting cardiology with rapid changes for adjustments in diuretics. I put in CCM referral yesterday, advised pt so she should expect phone call.  Put in for CCM (for CHF), but also SW, since she may have needs being 71, living alone, resistant to help from others, and family not nearby (other than niece) Will put in order for [facility] assessment of home.  Hopefully will get help in order to get into bed by herself, and other safety evaluation. HH PT as planned. F/u with cardiology as planned.

## 2022-11-09 NOTE — Patient Instructions (Signed)
Continue the current medications as listed (With the lasix being just Monday and Friday, or additionally as directed if your weight goes up). Be sure to contact the cardiology office if have any persistent or worsening dizziness, swelling, shortness of breath or leg swelling.  We are

## 2022-11-09 NOTE — Assessment & Plan Note (Signed)
Calan was stopped, now on metoprolol and losartan.  BP is well controlled and patient denies palpitations

## 2022-11-09 NOTE — Progress Notes (Signed)
  Care Coordination  Outreach Note  11/09/2022 Name: Michaeline Lambright MRN: [id number] DOB: Feb 17, 1927   Care Coordination Outreach Attempts: An unsuccessful telephone outreach was attempted today to offer the patient information about available care coordination services.  Follow Up Plan:  Additional outreach attempts will be made to offer the patient care coordination information and services.   Encounter Outcome:  No Answer  Christie Nottingham  Care Coordination Care Guide  Direct Dial: [phone]

## 2022-11-09 NOTE — Assessment & Plan Note (Signed)
Under the care of urologist, seen today. No infection. Some concern re: stones (asymptomatic now), and apparently CT is being ordered.

## 2022-11-10 ENCOUNTER — Ambulatory Visit: Payer: Medicare Other | Admitting: Cardiovascular Disease

## 2022-11-10 NOTE — Progress Notes (Signed)
  Care Coordination   Note   11/10/2022 Name: Mary [name] MRN: [id number] DOB: 05-31-1926  Mary [name] is a 89 y.o. year old female who sees Joselyn Arrow, MD for primary care. I reached out to [employer] by phone today to offer care coordination services.  [name] was given information about Care Coordination services today including:   The Care Coordination services include support from the care team which includes your Nurse Coordinator, Clinical Social Worker, or Pharmacist.  The Care Coordination team is here to help remove barriers to the health concerns and goals most important to you. Care Coordination services are voluntary, and the patient may decline or stop services at any time by request to their care team member.   Care Coordination Consent Status: Patient did not agree to participate in care coordination services at this time.  Encounter Outcome:  patient refused   [facility] Guide  Direct Dial: [phone]

## 2022-11-14 ENCOUNTER — Encounter: Payer: Self-pay | Admitting: *Deleted

## 2022-11-14 ENCOUNTER — Telehealth: Payer: Self-pay | Admitting: Family Medicine

## 2022-11-14 DIAGNOSIS — J9811 Atelectasis: Secondary | ICD-10-CM | POA: Diagnosis not present

## 2022-11-14 DIAGNOSIS — I110 Hypertensive heart disease with heart failure: Secondary | ICD-10-CM | POA: Diagnosis not present

## 2022-11-14 DIAGNOSIS — I420 Dilated cardiomyopathy: Secondary | ICD-10-CM | POA: Diagnosis not present

## 2022-11-14 DIAGNOSIS — E785 Hyperlipidemia, unspecified: Secondary | ICD-10-CM | POA: Diagnosis not present

## 2022-11-14 DIAGNOSIS — H9190 Unspecified hearing loss, unspecified ear: Secondary | ICD-10-CM | POA: Diagnosis not present

## 2022-11-14 DIAGNOSIS — M19072 Primary osteoarthritis, left ankle and foot: Secondary | ICD-10-CM | POA: Diagnosis not present

## 2022-11-14 DIAGNOSIS — R339 Retention of urine, unspecified: Secondary | ICD-10-CM | POA: Diagnosis not present

## 2022-11-14 DIAGNOSIS — E871 Hypo-osmolality and hyponatremia: Secondary | ICD-10-CM | POA: Diagnosis not present

## 2022-11-14 DIAGNOSIS — Z466 Encounter for fitting and adjustment of urinary device: Secondary | ICD-10-CM | POA: Diagnosis not present

## 2022-11-14 DIAGNOSIS — M19071 Primary osteoarthritis, right ankle and foot: Secondary | ICD-10-CM | POA: Diagnosis not present

## 2022-11-14 DIAGNOSIS — I4719 Other supraventricular tachycardia: Secondary | ICD-10-CM | POA: Diagnosis not present

## 2022-11-14 DIAGNOSIS — I5023 Acute on chronic systolic (congestive) heart failure: Secondary | ICD-10-CM | POA: Diagnosis not present

## 2022-11-14 DIAGNOSIS — M810 Age-related osteoporosis without current pathological fracture: Secondary | ICD-10-CM | POA: Diagnosis not present

## 2022-11-14 DIAGNOSIS — J449 Chronic obstructive pulmonary disease, unspecified: Secondary | ICD-10-CM | POA: Diagnosis not present

## 2022-11-14 DIAGNOSIS — E861 Hypovolemia: Secondary | ICD-10-CM | POA: Diagnosis not present

## 2022-11-14 DIAGNOSIS — N179 Acute kidney failure, unspecified: Secondary | ICD-10-CM | POA: Diagnosis not present

## 2022-11-14 DIAGNOSIS — M170 Bilateral primary osteoarthritis of knee: Secondary | ICD-10-CM | POA: Diagnosis not present

## 2022-11-14 DIAGNOSIS — Z435 Encounter for attention to cystostomy: Secondary | ICD-10-CM | POA: Diagnosis not present

## 2022-11-14 DIAGNOSIS — Z9181 History of falling: Secondary | ICD-10-CM | POA: Diagnosis not present

## 2022-11-14 NOTE — Telephone Encounter (Signed)
Orders given.  

## 2022-11-14 NOTE — Telephone Encounter (Signed)
Denise from [facility] called and is asking for home care orders for PT and OT. Provided call back number [phone]

## 2022-11-15 ENCOUNTER — Telehealth (HOSPITAL_COMMUNITY): Payer: Self-pay | Admitting: Cardiology

## 2022-11-15 DIAGNOSIS — I420 Dilated cardiomyopathy: Secondary | ICD-10-CM | POA: Diagnosis not present

## 2022-11-15 DIAGNOSIS — J449 Chronic obstructive pulmonary disease, unspecified: Secondary | ICD-10-CM | POA: Diagnosis not present

## 2022-11-15 DIAGNOSIS — I110 Hypertensive heart disease with heart failure: Secondary | ICD-10-CM | POA: Diagnosis not present

## 2022-11-15 DIAGNOSIS — J9811 Atelectasis: Secondary | ICD-10-CM | POA: Diagnosis not present

## 2022-11-15 DIAGNOSIS — I4719 Other supraventricular tachycardia: Secondary | ICD-10-CM | POA: Diagnosis not present

## 2022-11-15 DIAGNOSIS — I5023 Acute on chronic systolic (congestive) heart failure: Secondary | ICD-10-CM | POA: Diagnosis not present

## 2022-11-15 NOTE — Telephone Encounter (Signed)
Denise RN with [facility] [phone] Called to confirm  medication changes at last OV D/C Jardiacne and decrease lasix to twice weekly -med confirmed

## 2022-11-16 DIAGNOSIS — I4719 Other supraventricular tachycardia: Secondary | ICD-10-CM | POA: Diagnosis not present

## 2022-11-16 DIAGNOSIS — I110 Hypertensive heart disease with heart failure: Secondary | ICD-10-CM | POA: Diagnosis not present

## 2022-11-16 DIAGNOSIS — I5023 Acute on chronic systolic (congestive) heart failure: Secondary | ICD-10-CM | POA: Diagnosis not present

## 2022-11-16 DIAGNOSIS — J9811 Atelectasis: Secondary | ICD-10-CM | POA: Diagnosis not present

## 2022-11-16 DIAGNOSIS — I420 Dilated cardiomyopathy: Secondary | ICD-10-CM | POA: Diagnosis not present

## 2022-11-16 DIAGNOSIS — J449 Chronic obstructive pulmonary disease, unspecified: Secondary | ICD-10-CM | POA: Diagnosis not present

## 2022-11-21 ENCOUNTER — Ambulatory Visit: Payer: Medicare Other | Admitting: Physician Assistant

## 2022-11-21 NOTE — Progress Notes (Signed)
Cardiology Clinic Note   Patient Name: Mary [name] Date of Encounter: 11/22/2022  Primary Care Provider:  Joselyn Arrow, MD Primary Cardiologist:  Nanetta Batty MD  Patient Profile    89 year old female with history of hypertension, paroxysmal atrial tachycardia, hyperlipidemia, breast cancer status mastectomy, osteoporosis, urinary retention with suprapubic catheter and have HFmRF the patient has moderately reduced EF of 45%.  She remains on guideline directed medical therapy.  Past Medical History    Past Medical History:  Diagnosis Date   Alopecia    Ashkenazi Jewish ancestry    Cancer [facility])    External hemorrhoid    Foley catheter in place    Heart palpitations no cardiologist--  monitored by pcp   per pt "has been on verapamil and lanoxin for years and no palpitations for a very long time"   History of acute bronchitis    dx 12-21-2014  finished [facility]   History of adenomatous polyp of colon    2003   History of basal cell carcinoma excision    2006  left nasolabial fold   History of benign colon tumor    2007  --  HIGH GRADE HYPERPLASTIC ,  S/P  LEFT HEMICOLECTOMY   History of breast cancer ONCOLOGIST-  DR Darnelle Catalan--  antiestogen therapy with femera completed 12/  2012--  no recurrence   dx 10/  2007  right breast Invasive DCIS, grade 2, Stage 2A, pT2  pN0 pMX,  (ER 100% /PR 3% +),  HER +3) with 1 metastatic axill node---  s/p  right mastectomy    History of kidney stones    Hypertension    Hypotonic bladder UROLOGIST-  DR Patsi Sears   Mixed hyperlipidemia    OA (osteoarthritis)    hip   Osteoporosis    DEXA 01/2009; T-3.2 L fem neck, -2.4 L radius   Urinary retention    02-13-2015   Past Surgical History:  Procedure Laterality Date   APPENDECTOMY  age 74   BREAST LUMPECTOMY WITH NEEDLE LOCALIZATION AND AXILLARY SENTINEL LYMPH NODE BX Right 01-23-2006   CARPAL TUNNEL RELEASE Right 04-24-2007   and Pulley Release index and small finger   CATARACT EXTRACTION W/  INTRAOCULAR LENS  IMPLANT, BILATERAL  left 1991  &  right 1993   COLONOSCOPY  last one 2013   CYSTOSCOPY N/A 07/12/2015   Procedure: CYSTOSCOPY;  Surgeon: Jethro Bolus, MD;  Location: [facility];  Service: Urology;  Laterality: N/A;   CYSTOSCOPY WITH BIOPSY N/A 03/01/2015   Procedure: CYSTOSCOPY WITH TAUBER BIOPSY OF 1CM RIGHT LATERAL BLADDER WALL AND CAUTERIZATION OF BIOPSY SITE ;  Surgeon: Jethro Bolus, MD;  Location: [facility];  Service: Urology;  Laterality: N/A;   CYSTOSCOPY WITH LITHOLAPAXY N/A 09/06/2021   Procedure: CYSTOSCOPY WITH LITHOLAPAXY < 2cm, HOLMIUM LASER LITHOTRIPSY, BILATERAL RETROGRADE PYELOGRAM, SUPRAPUBIC CATHETER EXCHANGE;  Surgeon: Jerilee Field, MD;  Location: [facility];  Service: Urology;  Laterality: N/A;  ONLY NEEDS 90 MIN   INSERTION OF SUPRAPUBIC CATHETER N/A 07/12/2015   Procedure: INSERTION OF SUPRAPUBIC CATHETER;  Surgeon: Jethro Bolus, MD;  Location: [facility];  Service: Urology;  Laterality: N/A;   LAPAROSCOPIC ASSISTED LEFT HEMICOLECTOMY  06/  2007   high grade hyperplastic mass   MASTECTOMY Right 02-28-2006   PARTIAL KNEE ARTHROPLASTY Right 01/24/2013   Procedure: RIGHT KNEE MEDIAL UNICOMPARTMENTAL ARTHROPLASTY;  Surgeon: Loanne Drilling, MD;  Location: [facility];  Service: Orthopedics;  Laterality: Right;   PULLEY RELEASE LEFT INDEX AND SMALL  FINGER  08-07-2007   TONSILLECTOMY  age 44    Allergies  Allergies  Allergen Reactions   Arimidex [Anastrozole] Rash   Avelox [Moxifloxacin Hcl In Nacl] Nausea Only and Rash   Azithromycin Nausea Only and Rash   Bactrim Nausea Only and Rash   Biaxin [Clarithromycin] Nausea Only and Rash   Nitrofurantoin Nausea Only, Rash and Other (See Comments)   Penicillins Nausea Only and Rash   Sulfa Antibiotics Nausea And Vomiting and Rash   Sulfasalazine Rash and Other (See Comments)    GI intolerance    Sulfur Nausea And Vomiting and Rash    History of  Present Illness  [name] is here for ongoing assessment of hypertension, paroxysmal atrial tachycardia, hyperlipidemia, breast cancer status mastectomy, osteoporosis, urinary retention with suprapubic catheter and have HFmrEF.   She comes today with multiple complaints concerning osteoarthritis, right flank and right hip pain, along with history of mechanical fall a few days ago landing on her left hip.  She is not hungry is not eating much.  She also complains of dizzy headed feeling and wooziness after taking medications.  In fact she has not taken her medication today prior to coming to the office.  Blood pressure 128/62 today.  Home Medications    Current Outpatient Medications  Medication Sig Dispense Refill   acetaminophen (TYLENOL) 500 MG tablet Take 1,000 mg by mouth 2 (two) times daily at 8 am and 10 pm.     atorvastatin (LIPITOR) 40 MG tablet TAKE 1 TABLET(40 MG) BY MOUTH DAILY 90 tablet 3   bisacodyl (DULCOLAX) 5 MG EC tablet Take 5 mg by mouth daily as needed for moderate constipation.     Cholecalciferol (VITAMIN D3) 50 MCG (2000 UT) TABS Take 2,000 Units by mouth daily. 100 tablet 4   denosumab (PROLIA) 60 MG/ML SOLN injection Inject 60 mg into the skin every 6 (six) months. Administer in upper arm, thigh, or abdomen     diazepam (VALIUM) 2 MG tablet TAKE 1 TABLET(2 MG) BY MOUTH EVERY 8 HOURS AS NEEDED FOR MUSCLE SPASMS 30 tablet 0   fluticasone (CUTIVATE) 0.005 % ointment Apply 1 Application topically 2 (two) times daily.     furosemide (LASIX) 20 MG tablet Take 1 tablet (20 mg total) by mouth 2 (two) times a week. 12 tablet 0   loratadine (CLARITIN) 10 MG tablet Take 10 mg by mouth daily as needed for allergies.     losartan (COZAAR) 25 MG tablet Take 0.5 tablets (12.5 mg total) by mouth daily. 30 tablet 0   metoprolol succinate (TOPROL-XL) 25 MG 24 hr tablet Take 1 tablet (25 mg total) by mouth daily. Take with or immediately following a meal. 30 tablet 0   Multiple  Vitamins-Minerals (PRESERVISION AREDS 2) CAPS Take 1 capsule by mouth 2 (two) times daily.     Polyvinyl Alcohol-Povidone (REFRESH OP) Place 1 drop into both eyes 2 (two) times daily.     spironolactone (ALDACTONE) 25 MG tablet Take 0.5 tablets (12.5 mg total) by mouth daily. 30 tablet 0   triamcinolone cream (KENALOG) 0.1 % Apply 1 Application topically 2 (two) times daily. 30 g 0   No current facility-administered medications for this visit.     Family History    Family History  Problem Relation Age of Onset   Hypertension Mother    Stroke Father    Diabetes Father    Hyperlipidemia Brother    Atrial fibrillation Brother    Hyperlipidemia Son  Breast cancer Paternal Aunt        dx 74s; deceased early 26s   Breast cancer Cousin        paternal first cousin   Colon cancer Cousin        paternal first cousin; age dx 49s   She indicated that her mother is deceased. She indicated that her father is deceased. She indicated that her brother is deceased. She indicated that both of her daughters are alive. She indicated that her son is alive. She indicated that the status of her paternal aunt is unknown. She indicated that the status of her cousin is unknown.  Social History    Social History   Socioeconomic History   Marital status: Widowed    Spouse name: Not on file   Number of children: 3   Years of education: Not on file   Highest education level: Not on file  Occupational History   Not on file  Tobacco Use   Smoking status: Never   Smokeless tobacco: Never  Vaping Use   Vaping status: Never Used  Substance and Sexual Activity   Alcohol use: Never   Drug use: No   Sexual activity: Not Currently    Birth control/protection: Post-menopausal  Other Topics Concern   Not on file  Social History Narrative   Lives alone, no pets.   Children live in [place] and [place]. She has 1 son and 2 daughters; nephew lives in [place].   Widowed December 19, 1992   Her brother passed away (had moved  to [place] same time she did)      She no longer drives.   Has caregiver Zella Ball during the week (mornings, sometimes until 4), somebody on Saturday and 2022/12/20 evenings (sometimes).      Updated 07/2026   Social Determinants of Health   Financial Resource Strain: Not on file  Food Insecurity: No Food Insecurity (11/06/2022)   Hunger Vital Sign    Worried About Running Out of Food in the Last Year: Never true    Ran Out of Food in the Last Year: Never true  Transportation Needs: No Transportation Needs (11/06/2022)   PRAPARE - Administrator, Civil Service (Medical): No    Lack of Transportation (Non-Medical): No  Physical Activity: Not on file  Stress: Not on file  Social Connections: Not on file  Intimate Partner Violence: Not At Risk (10/31/2022)   Humiliation, Afraid, Rape, and Kick questionnaire    Fear of Current or Ex-Partner: No    Emotionally Abused: No    Physically Abused: No    Sexually Abused: No     Review of Systems    General:  No chills, fever, night sweats or weight changes.  Complaining of right hip and right groin pain, dizziness, and lightheadedness after taking her medications. Cardiovascular:  No chest pain, dyspnea on exertion, edema, orthopnea, palpitations, paroxysmal nocturnal dyspnea. Dermatological: No rash, lesions/masses Respiratory: No cough, dyspnea Urologic: No hematuria, dysuria Abdominal:   No nausea, vomiting, diarrhea, bright red blood per rectum, melena, or hematemesis Neurologic:  No visual changes, wkns, changes in mental status. All other systems reviewed and are otherwise negative except as noted above.       Physical Exam    VS:  BP 128/74   Pulse 82   Ht 5\' 3"  (1.6 m)   Wt 119 lb 3.2 oz (54.1 kg)   LMP  (LMP Unknown)   SpO2 97%   BMI 21.12 kg/m  , BMI Body  mass index is 21.12 kg/m.     GEN: Well nourished, well developed, in acute distress with right groin and right hip pain.  Frail uses walker for ambulation HEENT:  normal. Neck: Supple, no JVD, carotid bruits, or masses. Cardiac: RRR, no murmurs, rubs, or gallops. No clubbing, cyanosis, mild dependent edema is noted bilaterally edema.  Radials/DP/PT 2+ and equal bilaterally.  Respiratory:  Respirations regular and unlabored, clear to auscultation bilaterally. GI: Soft, nontender, nondistended, BS + x 4. MS: no deformity or atrophy. Skin: warm and dry, no rash. Neuro:  Strength is diminished,  sensation is intact.  Hard of hearing Psych: Normal affect.      Lab Results  Component Value Date   WBC 6.2 11/04/2022   HGB 12.1 11/04/2022   HCT 37.7 11/04/2022   MCV 89.1 11/04/2022   PLT 238 11/04/2022   Lab Results  Component Value Date   CREATININE 0.99 11/08/2022   BUN 20 11/08/2022   NA 133 (L) 11/08/2022   K 4.3 11/08/2022   CL 94 (L) 11/08/2022   CO2 29 11/08/2022   Lab Results  Component Value Date   ALT 16 11/03/2022   AST 22 11/03/2022   ALKPHOS 71 11/03/2022   BILITOT 0.9 11/03/2022   Lab Results  Component Value Date   CHOL 188 07/26/2022   HDL 95 07/26/2022   LDLCALC 74 07/26/2022   TRIG 111 07/26/2022   CHOLHDL 2.0 07/26/2022    No results found for: "HGBA1C"   Review of Prior Studies    Echocardiogram 11/08/2022 Cardiac Testing   1. Left ventricular ejection fraction, by estimation, is 45%. The left  ventricle has mildly decreased function. The left ventricle has no  regional wall motion abnormalities. Left ventricular diastolic parameters  are indeterminate.   2. Right ventricular systolic function is normal. The right ventricular  size is moderately enlarged.   3. Left atrial size was severely dilated.   4. The mitral valve is degenerative. No evidence of mitral valve  regurgitation. Severe mitral annular calcification.   5. The aortic valve was not well visualized. Aortic valve regurgitation  is not visualized.   Assessment & Plan   1.  Chronic combined HFrEF: Recent hospitalization for decompensation  with most recent ejection fraction measured at 45%.  She has been placed on GDMT which includes spironolactone 12.5 mg daily, Lasix 20 mg twice a week, metoprolol XL 25 mg daily losartan 25 mg daily.  No evidence of volume overload today.  Blood pressure is low normal but she has not yet taken her medications.  She denies any symptoms of shortness of breath but is very sedentary.  2.  Chronic osteoarthritis: Having severe pain of her right groin and right hip, she did take a mechanical fall landing on her left hip and was able to roll over onto her right hip and be helped up to a standing position.  She uses walker for ambulation.  She is experiencing more severe pain while in the office.  I will be sending her over to Dr. Horald Chestnut office which is across the hallway to see if they can evaluate her and possibly x-rayed her for any further injury concerning the fall or treatment changes.  3.  Hypertension: As discussed above she is normal today but has not yet taken medications.  May need to adjust medications to prevent dizziness and feelings of lightheadedness for safety reasons as she has fallen although she states it was reaching for a light switch and not  feeling near syncopal.  She is asked to take her blood pressure at the same time every day and record this so that I can see a trend concerning her blood pressure readings on current medication regimen.  May need to discontinue spironolactone if she is having issues with hypotension.  I have spend 45 minutes with this patient and her family answering questions and assessing her status, and making treatment plan.         Signed, Bettey Mare. Liborio Nixon, ANP, AACC   11/22/2022 12:02 PM      Office [phone] Fax [phone]  Notice: This dictation was prepared with Dragon dictation along with smaller phrase technology. Any transcriptional errors that result from this process are unintentional and may not be corrected upon review.

## 2022-11-22 ENCOUNTER — Ambulatory Visit: Payer: Medicare Other | Admitting: Nurse Practitioner

## 2022-11-22 ENCOUNTER — Ambulatory Visit: Payer: Medicare Other | Attending: Physician Assistant | Admitting: Adult Health

## 2022-11-22 ENCOUNTER — Encounter: Payer: Self-pay | Admitting: Adult Health

## 2022-11-22 VITALS — BP 128/74 | HR 82 | Ht 63.0 in | Wt 119.2 lb

## 2022-11-22 DIAGNOSIS — I5022 Chronic systolic (congestive) heart failure: Secondary | ICD-10-CM | POA: Insufficient documentation

## 2022-11-22 DIAGNOSIS — M25551 Pain in right hip: Secondary | ICD-10-CM | POA: Diagnosis not present

## 2022-11-22 DIAGNOSIS — I10 Essential (primary) hypertension: Secondary | ICD-10-CM | POA: Diagnosis not present

## 2022-11-22 DIAGNOSIS — M25552 Pain in left hip: Secondary | ICD-10-CM | POA: Diagnosis not present

## 2022-11-22 DIAGNOSIS — I5042 Chronic combined systolic (congestive) and diastolic (congestive) heart failure: Secondary | ICD-10-CM | POA: Diagnosis not present

## 2022-11-22 DIAGNOSIS — G8929 Other chronic pain: Secondary | ICD-10-CM | POA: Insufficient documentation

## 2022-11-22 DIAGNOSIS — Z9359 Other cystostomy status: Secondary | ICD-10-CM | POA: Insufficient documentation

## 2022-11-22 DIAGNOSIS — M1611 Unilateral primary osteoarthritis, right hip: Secondary | ICD-10-CM | POA: Diagnosis not present

## 2022-11-22 NOTE — Patient Instructions (Signed)
Medication Instructions:  No Changes *If you need a refill on your cardiac medications before your next appointment, please call your pharmacy*   Lab Work: No Labs If you have labs (blood work) drawn today and your tests are completely normal, you will receive your results only by: MyChart Message (if you have MyChart) OR A paper copy in the mail If you have any lab test that is abnormal or we need to change your treatment, we will call you to review the results.   Testing/Procedures: No Testing   Follow-Up: At [facility], you and your health needs are our priority.  As part of our continuing mission to provide you with exceptional heart care, we have created designated Provider Care Teams.  These Care Teams include your primary Cardiologist (physician) and Advanced Practice Providers (APPs -  Physician Assistants and Nurse Practitioners) who all work together to provide you with the care you need, when you need it.  We recommend signing up for the patient portal called "MyChart".  Sign up information is provided on this After Visit Summary.  MyChart is used to connect with patients for Virtual Visits (Telemedicine).  Patients are able to view lab/test results, encounter notes, upcoming appointments, etc.  Non-urgent messages can be sent to your provider as well.   To learn more about what you can do with MyChart, go to [email or web address].    Your next appointment:   1 month(s)  Provider:   Joni Reining, DNP, ANP

## 2022-11-23 DIAGNOSIS — I420 Dilated cardiomyopathy: Secondary | ICD-10-CM | POA: Diagnosis not present

## 2022-11-23 DIAGNOSIS — J9811 Atelectasis: Secondary | ICD-10-CM | POA: Diagnosis not present

## 2022-11-23 DIAGNOSIS — I110 Hypertensive heart disease with heart failure: Secondary | ICD-10-CM | POA: Diagnosis not present

## 2022-11-23 DIAGNOSIS — I4719 Other supraventricular tachycardia: Secondary | ICD-10-CM | POA: Diagnosis not present

## 2022-11-23 DIAGNOSIS — I5023 Acute on chronic systolic (congestive) heart failure: Secondary | ICD-10-CM | POA: Diagnosis not present

## 2022-11-23 DIAGNOSIS — J449 Chronic obstructive pulmonary disease, unspecified: Secondary | ICD-10-CM | POA: Diagnosis not present

## 2022-11-24 DIAGNOSIS — I110 Hypertensive heart disease with heart failure: Secondary | ICD-10-CM | POA: Diagnosis not present

## 2022-11-24 DIAGNOSIS — I5023 Acute on chronic systolic (congestive) heart failure: Secondary | ICD-10-CM | POA: Diagnosis not present

## 2022-11-24 DIAGNOSIS — J9811 Atelectasis: Secondary | ICD-10-CM | POA: Diagnosis not present

## 2022-11-24 DIAGNOSIS — J449 Chronic obstructive pulmonary disease, unspecified: Secondary | ICD-10-CM | POA: Diagnosis not present

## 2022-11-24 DIAGNOSIS — I4719 Other supraventricular tachycardia: Secondary | ICD-10-CM | POA: Diagnosis not present

## 2022-11-24 DIAGNOSIS — I420 Dilated cardiomyopathy: Secondary | ICD-10-CM | POA: Diagnosis not present

## 2022-11-29 DIAGNOSIS — J9811 Atelectasis: Secondary | ICD-10-CM | POA: Diagnosis not present

## 2022-11-29 DIAGNOSIS — I420 Dilated cardiomyopathy: Secondary | ICD-10-CM | POA: Diagnosis not present

## 2022-11-29 DIAGNOSIS — J449 Chronic obstructive pulmonary disease, unspecified: Secondary | ICD-10-CM | POA: Diagnosis not present

## 2022-11-29 DIAGNOSIS — I110 Hypertensive heart disease with heart failure: Secondary | ICD-10-CM | POA: Diagnosis not present

## 2022-11-29 DIAGNOSIS — I5023 Acute on chronic systolic (congestive) heart failure: Secondary | ICD-10-CM | POA: Diagnosis not present

## 2022-11-29 DIAGNOSIS — I4719 Other supraventricular tachycardia: Secondary | ICD-10-CM | POA: Diagnosis not present

## 2022-11-30 DIAGNOSIS — I5023 Acute on chronic systolic (congestive) heart failure: Secondary | ICD-10-CM | POA: Diagnosis not present

## 2022-11-30 DIAGNOSIS — I110 Hypertensive heart disease with heart failure: Secondary | ICD-10-CM | POA: Diagnosis not present

## 2022-11-30 DIAGNOSIS — J449 Chronic obstructive pulmonary disease, unspecified: Secondary | ICD-10-CM | POA: Diagnosis not present

## 2022-11-30 DIAGNOSIS — J9811 Atelectasis: Secondary | ICD-10-CM | POA: Diagnosis not present

## 2022-11-30 DIAGNOSIS — I420 Dilated cardiomyopathy: Secondary | ICD-10-CM | POA: Diagnosis not present

## 2022-11-30 DIAGNOSIS — I4719 Other supraventricular tachycardia: Secondary | ICD-10-CM | POA: Diagnosis not present

## 2022-12-04 ENCOUNTER — Telehealth: Payer: Self-pay | Admitting: Cardiology

## 2022-12-04 ENCOUNTER — Telehealth: Payer: Self-pay | Admitting: Adult Health

## 2022-12-04 NOTE — Telephone Encounter (Signed)
Patients daughter said that patient is complaining that legs is extremely heavy and can barely lift them to go to bed. Please call back

## 2022-12-04 NOTE — Telephone Encounter (Signed)
[place] has called stating she cannot "lift her legs".  Patient states they are "just so heavy".  States weight is only up by a pound.  Her weight 9/4 119 Yesterday 118 and today afternoon is 119. Spoke with [facility] aide while daughter on phone.  She sates  No swelling noted to lower legs.  Normal color.  SOB is only after exertion. Nothing concerning from cardiac standpoint./  Advised to discuss with PCP as they may need to see her since she is Post fall and due to her chronic osteoarthritis

## 2022-12-04 NOTE — Telephone Encounter (Signed)
Please disregard

## 2022-12-06 ENCOUNTER — Telehealth (HOSPITAL_COMMUNITY): Payer: Self-pay | Admitting: Cardiology

## 2022-12-06 DIAGNOSIS — J9811 Atelectasis: Secondary | ICD-10-CM | POA: Diagnosis not present

## 2022-12-06 DIAGNOSIS — I5023 Acute on chronic systolic (congestive) heart failure: Secondary | ICD-10-CM | POA: Diagnosis not present

## 2022-12-06 DIAGNOSIS — I420 Dilated cardiomyopathy: Secondary | ICD-10-CM | POA: Diagnosis not present

## 2022-12-06 DIAGNOSIS — I110 Hypertensive heart disease with heart failure: Secondary | ICD-10-CM | POA: Diagnosis not present

## 2022-12-06 DIAGNOSIS — I4719 Other supraventricular tachycardia: Secondary | ICD-10-CM | POA: Diagnosis not present

## 2022-12-06 DIAGNOSIS — J449 Chronic obstructive pulmonary disease, unspecified: Secondary | ICD-10-CM | POA: Diagnosis not present

## 2022-12-06 MED ORDER — SPIRONOLACTONE 25 MG PO TABS: 12.5000 mg | ORAL_TABLET | Freq: Every day | ORAL | 3 refills | Status: DC

## 2022-12-06 MED ORDER — LOSARTAN POTASSIUM 25 MG PO TABS: 12.5000 mg | ORAL_TABLET | Freq: Every day | ORAL | 3 refills | Status: DC

## 2022-12-06 MED ORDER — METOPROLOL SUCCINATE ER 25 MG PO TB24: 25.0000 mg | ORAL_TABLET | Freq: Every day | ORAL | 3 refills | Status: DC

## 2022-12-06 NOTE — Telephone Encounter (Signed)
Angelique Blonder, RN with [facility] Called to request rx to local pharmacy  [facility]   -spiro -losartan -metoprolol   Meds sent however additional refills should come from Dr Allyson Sabal

## 2022-12-07 DIAGNOSIS — N312 Flaccid neuropathic bladder, not elsewhere classified: Secondary | ICD-10-CM | POA: Diagnosis not present

## 2022-12-12 ENCOUNTER — Other Ambulatory Visit: Payer: Self-pay | Admitting: Family Medicine

## 2022-12-12 DIAGNOSIS — L300 Nummular dermatitis: Secondary | ICD-10-CM

## 2022-12-12 NOTE — Telephone Encounter (Signed)
Is this okay to refill? 

## 2022-12-13 DIAGNOSIS — I420 Dilated cardiomyopathy: Secondary | ICD-10-CM | POA: Diagnosis not present

## 2022-12-13 DIAGNOSIS — I110 Hypertensive heart disease with heart failure: Secondary | ICD-10-CM | POA: Diagnosis not present

## 2022-12-13 DIAGNOSIS — J449 Chronic obstructive pulmonary disease, unspecified: Secondary | ICD-10-CM | POA: Diagnosis not present

## 2022-12-13 DIAGNOSIS — J9811 Atelectasis: Secondary | ICD-10-CM | POA: Diagnosis not present

## 2022-12-13 DIAGNOSIS — I5023 Acute on chronic systolic (congestive) heart failure: Secondary | ICD-10-CM | POA: Diagnosis not present

## 2022-12-13 DIAGNOSIS — I4719 Other supraventricular tachycardia: Secondary | ICD-10-CM | POA: Diagnosis not present

## 2022-12-14 DIAGNOSIS — Z435 Encounter for attention to cystostomy: Secondary | ICD-10-CM | POA: Diagnosis not present

## 2022-12-14 DIAGNOSIS — Z466 Encounter for fitting and adjustment of urinary device: Secondary | ICD-10-CM | POA: Diagnosis not present

## 2022-12-14 DIAGNOSIS — E785 Hyperlipidemia, unspecified: Secondary | ICD-10-CM | POA: Diagnosis not present

## 2022-12-14 DIAGNOSIS — J9811 Atelectasis: Secondary | ICD-10-CM | POA: Diagnosis not present

## 2022-12-14 DIAGNOSIS — M19071 Primary osteoarthritis, right ankle and foot: Secondary | ICD-10-CM | POA: Diagnosis not present

## 2022-12-14 DIAGNOSIS — N210 Calculus in bladder: Secondary | ICD-10-CM | POA: Diagnosis not present

## 2022-12-14 DIAGNOSIS — E871 Hypo-osmolality and hyponatremia: Secondary | ICD-10-CM | POA: Diagnosis not present

## 2022-12-14 DIAGNOSIS — I5023 Acute on chronic systolic (congestive) heart failure: Secondary | ICD-10-CM | POA: Diagnosis not present

## 2022-12-14 DIAGNOSIS — E861 Hypovolemia: Secondary | ICD-10-CM | POA: Diagnosis not present

## 2022-12-14 DIAGNOSIS — J449 Chronic obstructive pulmonary disease, unspecified: Secondary | ICD-10-CM | POA: Diagnosis not present

## 2022-12-14 DIAGNOSIS — I4719 Other supraventricular tachycardia: Secondary | ICD-10-CM | POA: Diagnosis not present

## 2022-12-14 DIAGNOSIS — M19072 Primary osteoarthritis, left ankle and foot: Secondary | ICD-10-CM | POA: Diagnosis not present

## 2022-12-14 DIAGNOSIS — I110 Hypertensive heart disease with heart failure: Secondary | ICD-10-CM | POA: Diagnosis not present

## 2022-12-14 DIAGNOSIS — R339 Retention of urine, unspecified: Secondary | ICD-10-CM | POA: Diagnosis not present

## 2022-12-14 DIAGNOSIS — N179 Acute kidney failure, unspecified: Secondary | ICD-10-CM | POA: Diagnosis not present

## 2022-12-14 DIAGNOSIS — N281 Cyst of kidney, acquired: Secondary | ICD-10-CM | POA: Diagnosis not present

## 2022-12-14 DIAGNOSIS — N3289 Other specified disorders of bladder: Secondary | ICD-10-CM | POA: Diagnosis not present

## 2022-12-14 DIAGNOSIS — H9190 Unspecified hearing loss, unspecified ear: Secondary | ICD-10-CM | POA: Diagnosis not present

## 2022-12-14 DIAGNOSIS — Z9181 History of falling: Secondary | ICD-10-CM | POA: Diagnosis not present

## 2022-12-14 DIAGNOSIS — I420 Dilated cardiomyopathy: Secondary | ICD-10-CM | POA: Diagnosis not present

## 2022-12-14 DIAGNOSIS — M810 Age-related osteoporosis without current pathological fracture: Secondary | ICD-10-CM | POA: Diagnosis not present

## 2022-12-14 DIAGNOSIS — M170 Bilateral primary osteoarthritis of knee: Secondary | ICD-10-CM | POA: Diagnosis not present

## 2022-12-18 ENCOUNTER — Encounter (HOSPITAL_COMMUNITY): Payer: Self-pay

## 2022-12-18 ENCOUNTER — Other Ambulatory Visit: Payer: Self-pay

## 2022-12-18 ENCOUNTER — Encounter: Payer: Self-pay | Admitting: Family Medicine

## 2022-12-18 ENCOUNTER — Emergency Department (HOSPITAL_COMMUNITY)
Admission: EM | Admit: 2022-12-18 | Discharge: 2022-12-18 | Disposition: A | Discharge status: Home / Self Care | Payer: Medicare Other | Attending: Emergency Medicine | Admitting: Emergency Medicine

## 2022-12-18 DIAGNOSIS — I509 Heart failure, unspecified: Secondary | ICD-10-CM | POA: Insufficient documentation

## 2022-12-18 DIAGNOSIS — I10 Essential (primary) hypertension: Secondary | ICD-10-CM | POA: Diagnosis not present

## 2022-12-18 DIAGNOSIS — R339 Retention of urine, unspecified: Secondary | ICD-10-CM | POA: Diagnosis not present

## 2022-12-18 DIAGNOSIS — T83091A Other mechanical complication of indwelling urethral catheter, initial encounter: Secondary | ICD-10-CM | POA: Diagnosis not present

## 2022-12-18 DIAGNOSIS — Y732 Prosthetic and other implants, materials and accessory gastroenterology and urology devices associated with adverse incidents: Secondary | ICD-10-CM | POA: Insufficient documentation

## 2022-12-18 DIAGNOSIS — R5381 Other malaise: Secondary | ICD-10-CM | POA: Diagnosis not present

## 2022-12-18 DIAGNOSIS — Z79899 Other long term (current) drug therapy: Secondary | ICD-10-CM | POA: Diagnosis not present

## 2022-12-18 DIAGNOSIS — R600 Localized edema: Secondary | ICD-10-CM | POA: Diagnosis not present

## 2022-12-18 DIAGNOSIS — I110 Hypertensive heart disease with heart failure: Secondary | ICD-10-CM | POA: Insufficient documentation

## 2022-12-18 DIAGNOSIS — T83010A Breakdown (mechanical) of cystostomy catheter, initial encounter: Secondary | ICD-10-CM

## 2022-12-18 DIAGNOSIS — T83098A Other mechanical complication of other indwelling urethral catheter, initial encounter: Secondary | ICD-10-CM | POA: Insufficient documentation

## 2022-12-18 DIAGNOSIS — R000 Tachycardia, unspecified: Secondary | ICD-10-CM | POA: Insufficient documentation

## 2022-12-18 LAB — COMPREHENSIVE METABOLIC PANEL
ALT: 22 U/L (ref 0–44)
AST: 20 U/L (ref 15–41)
Albumin: 4.1 g/dL (ref 3.5–5.0)
Alkaline Phosphatase: 76 U/L (ref 38–126)
Anion gap: 13 (ref 5–15)
BUN: 15 mg/dL (ref 8–23)
CO2: 25 mmol/L (ref 22–32)
Calcium: 9.4 mg/dL (ref 8.9–10.3)
Chloride: 95 mmol/L — ABNORMAL LOW (ref 98–111)
Creatinine, Ser: 0.56 mg/dL (ref 0.44–1.00)
GFR, Estimated: >60 mL/min (ref >60)
Glucose, Bld: 107 mg/dL — ABNORMAL HIGH (ref 70–99)
Potassium: 3.5 mmol/L (ref 3.5–5.1)
Sodium: 133 mmol/L — ABNORMAL LOW (ref 135–145)
Total Bilirubin: 1.1 mg/dL (ref 0.3–1.2)
Total Protein: 7.3 g/dL (ref 6.5–8.1)

## 2022-12-18 LAB — CBC WITH DIFFERENTIAL/PLATELET
Abs Immature Granulocytes: 0.02 10*3/uL (ref 0.00–0.07)
Basophils Absolute: 0.1 10*3/uL (ref 0.0–0.1)
Basophils Relative: 1 %
Eosinophils Absolute: 0.3 10*3/uL (ref 0.0–0.5)
Eosinophils Relative: 4 %
HCT: 43.6 % (ref 36.0–46.0)
Hemoglobin: 14.1 g/dL (ref 12.0–15.0)
Immature Granulocytes: 0 %
Lymphocytes Relative: 8 %
Lymphs Abs: 0.6 10*3/uL — ABNORMAL LOW (ref 0.7–4.0)
MCH: 28.7 pg (ref 26.0–34.0)
MCHC: 32.3 g/dL (ref 30.0–36.0)
MCV: 88.6 fL (ref 80.0–100.0)
Monocytes Absolute: 0.5 10*3/uL (ref 0.1–1.0)
Monocytes Relative: 7 %
Neutro Abs: 5.8 10*3/uL (ref 1.7–7.7)
Neutrophils Relative %: 80 %
Platelets: 233 10*3/uL (ref 150–400)
RBC: 4.92 MIL/uL (ref 3.87–5.11)
RDW: 15.5 % (ref 11.5–15.5)
WBC: 7.2 10*3/uL (ref 4.0–10.5)
nRBC: 0 % (ref 0.0–0.2)

## 2022-12-18 LAB — URINALYSIS, W/ REFLEX TO CULTURE (INFECTION SUSPECTED)
Bilirubin Urine: NEGATIVE
Glucose, UA: NEGATIVE mg/dL
Hgb urine dipstick: NEGATIVE
Ketones, ur: NEGATIVE mg/dL
Nitrite: POSITIVE — AB
Protein, ur: 100 mg/dL — AB
Specific Gravity, Urine: 1.009 (ref 1.005–1.030)
pH: 7 (ref 5.0–8.0)

## 2022-12-18 MED ORDER — METOPROLOL SUCCINATE ER 25 MG PO TB24
25.0000 mg | ORAL_TABLET | Freq: Once | ORAL | Status: AC
  Administered 2022-12-18: 25 mg via ORAL
  Filled 2022-12-18: qty 1

## 2022-12-18 MED ORDER — LOSARTAN POTASSIUM 25 MG PO TABS
12.5000 mg | ORAL_TABLET | Freq: Once | ORAL | Status: AC
  Administered 2022-12-18: 12.5 mg via ORAL
  Filled 2022-12-18: qty 1

## 2022-12-18 NOTE — ED Notes (Signed)
New Suprapubic catheter placed, pt states 0/10 pain

## 2022-12-18 NOTE — ED Triage Notes (Signed)
Pt BIB EMS from Home. Pt has a supra cath recently placed. Pt c/o of 10/10 sharp pain lower abdominal pain start late night. Pt did not noted any drainage this morning. White puss noted in catheter tubing.  BP 200/90 HR 160 SpO2 96 RR 26

## 2022-12-18 NOTE — ED Provider Notes (Signed)
[place] EMERGENCY DEPARTMENT AT [facility] Provider Note   CSN: [id number] Arrival date & time: 12/18/22  5284     History  Chief Complaint  Patient presents with   Dysuria    Mary [name] is a 89 y.o. female.   Pt is a 89y/o female with hx of hypertension, hyperlipidemia, osteoarthritis, unspecified palpitations urinary retention with suprapubic catheter, CHF with EF of 45% from recent hospitalization in aug 24' who is presenting today with significant discomfort in her bladder area.  She reports around 3 AM she started having a burning discomfort in her abdomen but that continued to worsen with no output from her catheter.  On the way in by ambulance she started leaking around the catheter.  She denies having any urinary issues prior to this.  In the last few days she reports feeling her normal self.  She does get out of breath when getting up and walking with her walker but it typically improves when she sits down.  She has not had fever, vomiting, chest pain or palpitations.  She denies a history of atrial fibrillation or being on any anticoagulation.  Currently she reports just being in severe discomfort from her catheter.  She has had the catheter for years and never had this problem in the past.  The history is provided by the patient, medical records and the EMS personnel.  Dysuria      Home Medications Prior to Admission medications   Medication Sig Start Date End Date Taking? Authorizing Provider  acetaminophen (TYLENOL) 500 MG tablet Take 1,000 mg by mouth 2 (two) times daily at 8 am and 10 pm.    [provider]  atorvastatin (LIPITOR) 40 MG tablet TAKE 1 TABLET(40 MG) BY MOUTH DAILY 09/18/22   Joselyn Arrow, MD  bisacodyl (DULCOLAX) 5 MG EC tablet Take 5 mg by mouth daily as needed for moderate constipation.    [provider]  Cholecalciferol (VITAMIN D3) 50 MCG (2000 UT) TABS Take 2,000 Units by mouth daily. 08/03/20   Magrinat, Valentino Hue, MD   denosumab (PROLIA) 60 MG/ML SOLN injection Inject 60 mg into the skin every 6 (six) months. Administer in upper arm, thigh, or abdomen 10/24/12   [provider]  diazepam (VALIUM) 2 MG tablet TAKE 1 TABLET(2 MG) BY MOUTH EVERY 8 HOURS AS NEEDED FOR MUSCLE SPASMS 09/27/22   Joselyn Arrow, MD  fluticasone (CUTIVATE) 0.005 % ointment Apply 1 Application topically 2 (two) times daily.    [provider]  furosemide (LASIX) 20 MG tablet Take 1 tablet (20 mg total) by mouth 2 (two) times a week. 11/09/22   Clegg, Amy D, NP  loratadine (CLARITIN) 10 MG tablet Take 10 mg by mouth daily as needed for allergies.    [provider]  losartan (COZAAR) 25 MG tablet Take 0.5 tablets (12.5 mg total) by mouth daily. 12/06/22   Laurey Morale, MD  metoprolol succinate (TOPROL-XL) 25 MG 24 hr tablet Take 1 tablet (25 mg total) by mouth daily. Take with or immediately following a meal. 12/06/22   Laurey Morale, MD  Multiple Vitamins-Minerals (PRESERVISION AREDS 2) CAPS Take 1 capsule by mouth 2 (two) times daily.    [provider]  Polyvinyl Alcohol-Povidone (REFRESH OP) Place 1 drop into both eyes 2 (two) times daily.    [provider]  spironolactone (ALDACTONE) 25 MG tablet Take 0.5 tablets (12.5 mg total) by mouth daily. 12/06/22   Laurey Morale, MD  triamcinolone  cream (KENALOG) 0.1 % APPLY TOPICALLY TO THE AFFECTED AREA TWICE DAILY 12/12/22   Joselyn Arrow, MD      Allergies    Arimidex [anastrozole], Avelox [moxifloxacin hcl in nacl], Azithromycin, Bactrim, Biaxin [clarithromycin], Nitrofurantoin, Penicillins, Sulfa antibiotics, Sulfasalazine, and Sulfur    Review of Systems   Review of Systems  Genitourinary:  Positive for dysuria.    Physical Exam Updated Vital Signs BP (!) 164/125   Pulse 81   Temp 98.5 F (36.9 C) (Oral)   Resp (!) 23   LMP  (LMP Unknown)   SpO2 92%  Physical Exam Vitals and nursing note reviewed.  Constitutional:      General: She  is in acute distress.     Appearance: She is well-developed.  HENT:     Head: Normocephalic and atraumatic.  Eyes:     Conjunctiva/sclera: Conjunctivae normal.     Pupils: Pupils are equal, round, and reactive to light.  Cardiovascular:     Rate and Rhythm: Regular rhythm. Tachycardia present.     Heart sounds: No murmur heard. Pulmonary:     Effort: Pulmonary effort is normal. Tachypnea present. No respiratory distress.     Breath sounds: Normal breath sounds. No wheezing or rales.  Abdominal:     General: There is distension.     Palpations: Abdomen is soft.     Tenderness: There is abdominal tenderness. There is no guarding or rebound.     Comments: Suprapubic catheter in place.  Not draining with significant exudate in the tube and sediment.  Urine leaking around the catheter site  Musculoskeletal:        General: No tenderness. Normal range of motion.     Cervical back: Normal range of motion and neck supple.     Right lower leg: Edema present.     Left lower leg: Edema present.     Comments: 2+ pitting edema bilateral lower extremities to the mid tib-fib  Skin:    General: Skin is warm and dry.     Findings: No erythema or rash.  Neurological:     Mental Status: She is alert and oriented to person, place, and time. Mental status is at baseline.  Psychiatric:        Behavior: Behavior normal.     ED Results / Procedures / Treatments   Labs (all labs ordered are listed, but only abnormal results are displayed) Labs Reviewed  CBC WITH DIFFERENTIAL/PLATELET - Abnormal; Notable for the following components:      Result Value   Lymphs Abs 0.6 (*)    All other components within normal limits  COMPREHENSIVE METABOLIC PANEL - Abnormal; Notable for the following components:   Sodium 133 (*)    Chloride 95 (*)    Glucose, Bld 107 (*)    All other components within normal limits  URINALYSIS, W/ REFLEX TO CULTURE (INFECTION SUSPECTED) - Abnormal; Notable for the following  components:   Color, Urine STRAW (*)    Protein, ur 100 (*)    Nitrite POSITIVE (*)    Leukocytes,Ua MODERATE (*)    Bacteria, UA RARE (*)    All other components within normal limits    EKG EKG Interpretation Date/Time:  Monday December 18 2022 08:14:56 EDT Ventricular Rate:  163 PR Interval:  106 QRS Duration:  134 QT Interval:  330 QTC Calculation: 544 R Axis:   -37  Text Interpretation: Wide-QRS tachycardia Right bundle branch block Artifact in lead(s) I II III aVR aVL aVF V1  V2 Confirmed by Gwyneth Sprout ([id number]) on 12/18/2022 8:40:01 AM  Radiology No results found.  Procedures Procedures    Medications Ordered in ED Medications  losartan (COZAAR) tablet 12.5 mg (12.5 mg Oral Given 12/18/22 0822)  metoprolol succinate (TOPROL-XL) 24 hr tablet 25 mg (25 mg Oral Given 12/18/22 9629)    ED Course/ Medical Decision Making/ A&P                                 Medical Decision Making Amount and/or Complexity of Data Reviewed External Data Reviewed: notes. Labs: ordered. Decision-making details documented in ED Course. ECG/medicine tests: ordered and independent interpretation performed. Decision-making details documented in ED Course.  Risk Prescription drug management.   Pt with multiple medical problems and comorbidities and presenting today with a complaint that caries a high risk for morbidity and mortality.  Here today with symptoms most consistent with urinary retention from suprapubic catheter malfunction and clog.  Suprapubic catheter was removed with significant urine removed over a liter.  Patient's abdominal distention and pain improved however patient continues to be tachycardic and still appears winded.  She was recently hospitalized for CHF exacerbation but has no history of dysrhythmias.  Will do EKG and sure labs have not significantly changed.  Patient has also not had her morning medications which 1 of those includes metoprolol.  Patient will be given  her morning medications.  Labs pending.  Heart rate improving as discomfort is relieving as well.  9:50 AM I independently interpreted patient's EKG which showed a tachycardic rhythm at 163 which was wide but patient has a history of a right bundle branch block.  On reevaluation now that the patient no longer is in pain heart rate has improved to the 80s and 90s.  Appears to be sinus with occasional PVCs.  This appears to be her baseline based on prior EKGs.  I independently interpreted patient's labs and CBC within normal limits today, CMP without acute findings, UA today without acute findings of infection.  Patient has a chronic catheter and does have 6-10 white cells but only rare bacteria and low suspicion for UTI at this time.  Patient was given education on how to flush her catheter as well as was given a leg bag.  She also does Her catheter at times and then just empties intermittently which would also be fine.  All this was discussed with the patient and her family member.  Heart rate remains less than 100.  Patient appears to be breathing normally and denies any acute issues in regards to that.  They feel comfortable going home at this time.  She was given return precautions.  No social determinants affecting discharge today.         Final Clinical Impression(s) / ED Diagnoses Final diagnoses:  Urinary retention  Suprapubic catheter dysfunction, initial encounter [facility])    Rx / DC Orders ED Discharge Orders     None         Gwyneth Sprout, MD 12/18/22 [phone]

## 2022-12-18 NOTE — Discharge Instructions (Signed)
You can Your suprapubic catheter at night or use the bag.  You can also use the leg bag during the day.  Make sure you are flushing several times a week especially if you start to see sediment so that catheter does not get clogged again.  Thankfully all of your blood work look normal today.  Your urine was sent for culture but there was not obvious infection.

## 2022-12-19 ENCOUNTER — Other Ambulatory Visit: Payer: Self-pay | Admitting: *Deleted

## 2022-12-19 DIAGNOSIS — I509 Heart failure, unspecified: Secondary | ICD-10-CM

## 2022-12-19 DIAGNOSIS — R0609 Other forms of dyspnea: Secondary | ICD-10-CM

## 2022-12-19 DIAGNOSIS — Z9359 Other cystostomy status: Secondary | ICD-10-CM

## 2022-12-19 NOTE — Progress Notes (Unsigned)
Start time: End time:  Virtual Visit via Telephone Note  I connected with Mary [name] on 12/19/22 by telephone and verified that I am speaking with the correct person using two identifiers. Patient reported inability to do video visit.  Location: Patient: *** Provider: office   I discussed the limitations of evaluation and management by telemedicine and the availability of in person appointments. The patient expressed understanding and agreed to proceed.  History of Present Illness:  No chief complaint on file.  Patient was scheduled for visit today after her daughter [name] requested medications to help her sleep. Per [name]: "She's having trouble sleeping at night. One reason is "twitching" of her legs. She calls it restless leg syndrome. She's also experiencing a burning sensation between her ankles and knees (on occasion) when she lies down, which also interferes with sleep. They get very red and hot. It goes away when she walks or sits down. Is there something you can prescribe to help her sleep?"  She recently had an obstruction of her suprapubic urinary catheter. [name] (daughter) asked for assistance from home health nurses in flushing her catheter, as this has happened twice now. Referral was put in for [facility].  Chronic combined HFrEF --she saw cardiologist 9/4.  Compliant with taking spironolactone 12.5 mg daily, Lasix 20 mg twice a week, metoprolol XL 25 mg daily losartan 25 mg daily.  Checking BP's??? ***  Saw ortho Basil Dess, [place]) on 9/4 for RLE pain, severe hip OA. He recommended repeating hip injections intermittently. (She last had injections to R hip in June, and in March). She was told to stop Advil and was prescribed tramadol.  Last filled Tramadol 6/5 #15 (rx'd by Dr. Ethelene Hal)    Observations/Objective:  LMP  (LMP Unknown)    Assessment and Plan:   ***telephone or video?  Gabapentin, due to chronic pain (hip arthritis), and trouble  sleeping?   Follow Up Instructions:    I discussed the assessment and treatment plan with the patient. The patient was provided an opportunity to ask questions and all were answered. The patient agreed with the plan and demonstrated an understanding of the instructions.   The patient was advised to call back or seek an in-person evaluation if the symptoms worsen or if the condition fails to improve as anticipated.  I spent *** minutes dedicated to the care of this patient, including pre-visit review of records, face to face time, post-visit ordering of testing and documentation.    Lavonda Jumbo, MD

## 2022-12-19 NOTE — Telephone Encounter (Signed)
Referral done

## 2022-12-20 ENCOUNTER — Encounter: Payer: Self-pay | Admitting: Family Medicine

## 2022-12-20 ENCOUNTER — Telehealth (INDEPENDENT_AMBULATORY_CARE_PROVIDER_SITE_OTHER): Payer: Medicare Other | Admitting: Family Medicine

## 2022-12-20 VITALS — BP 132/60 | Ht 63.0 in | Wt 117.0 lb

## 2022-12-20 DIAGNOSIS — G2581 Restless legs syndrome: Secondary | ICD-10-CM | POA: Diagnosis not present

## 2022-12-20 DIAGNOSIS — I4719 Other supraventricular tachycardia: Secondary | ICD-10-CM | POA: Diagnosis not present

## 2022-12-20 DIAGNOSIS — J9811 Atelectasis: Secondary | ICD-10-CM | POA: Diagnosis not present

## 2022-12-20 DIAGNOSIS — I420 Dilated cardiomyopathy: Secondary | ICD-10-CM | POA: Diagnosis not present

## 2022-12-20 DIAGNOSIS — I5023 Acute on chronic systolic (congestive) heart failure: Secondary | ICD-10-CM | POA: Diagnosis not present

## 2022-12-20 DIAGNOSIS — I110 Hypertensive heart disease with heart failure: Secondary | ICD-10-CM | POA: Diagnosis not present

## 2022-12-20 DIAGNOSIS — M169 Osteoarthritis of hip, unspecified: Secondary | ICD-10-CM | POA: Diagnosis not present

## 2022-12-20 DIAGNOSIS — J449 Chronic obstructive pulmonary disease, unspecified: Secondary | ICD-10-CM | POA: Diagnosis not present

## 2022-12-20 MED ORDER — GABAPENTIN 100 MG PO CAPS: ORAL_CAPSULE | ORAL | 0 refills | Status: DC

## 2022-12-20 NOTE — Patient Instructions (Signed)
Take gabapentin 100 mg capsule 30 minutes before bedtime. If after one week you don't notice any improvement in the twitching (and therefore still having trouble sleeping), you can increase it to 2 capsules at one time (200 mg). Please call us in 1-2 weeks and let us know how you are doing with this.  If you find yourself having more muscle cramps or spasms (rather than just the short twitching), we may need to do some blood tests to evaluate the electrolytes (due to some of the medications/diuretics that you take). Be sure to stay well hydrated--drink plenty of water to try and keep the urine very pale in color.   Continue your current medications.  Try and remember to elevate your legs periodically, if you aren't wearing compression stockings. Be sure to stay active and move around, to let your muscles also keep the fluid moving.

## 2022-12-22 DIAGNOSIS — I110 Hypertensive heart disease with heart failure: Secondary | ICD-10-CM | POA: Diagnosis not present

## 2022-12-22 DIAGNOSIS — J9811 Atelectasis: Secondary | ICD-10-CM | POA: Diagnosis not present

## 2022-12-22 DIAGNOSIS — J449 Chronic obstructive pulmonary disease, unspecified: Secondary | ICD-10-CM | POA: Diagnosis not present

## 2022-12-22 DIAGNOSIS — I420 Dilated cardiomyopathy: Secondary | ICD-10-CM | POA: Diagnosis not present

## 2022-12-22 DIAGNOSIS — I5023 Acute on chronic systolic (congestive) heart failure: Secondary | ICD-10-CM | POA: Diagnosis not present

## 2022-12-22 DIAGNOSIS — I4719 Other supraventricular tachycardia: Secondary | ICD-10-CM | POA: Diagnosis not present

## 2022-12-25 ENCOUNTER — Encounter: Payer: Self-pay | Admitting: Family Medicine

## 2022-12-26 DIAGNOSIS — I5023 Acute on chronic systolic (congestive) heart failure: Secondary | ICD-10-CM | POA: Diagnosis not present

## 2022-12-26 DIAGNOSIS — J9811 Atelectasis: Secondary | ICD-10-CM | POA: Diagnosis not present

## 2022-12-26 DIAGNOSIS — J449 Chronic obstructive pulmonary disease, unspecified: Secondary | ICD-10-CM | POA: Diagnosis not present

## 2022-12-26 DIAGNOSIS — I110 Hypertensive heart disease with heart failure: Secondary | ICD-10-CM | POA: Diagnosis not present

## 2022-12-26 DIAGNOSIS — I4719 Other supraventricular tachycardia: Secondary | ICD-10-CM | POA: Diagnosis not present

## 2022-12-26 DIAGNOSIS — I420 Dilated cardiomyopathy: Secondary | ICD-10-CM | POA: Diagnosis not present

## 2022-12-27 DIAGNOSIS — J9811 Atelectasis: Secondary | ICD-10-CM | POA: Diagnosis not present

## 2022-12-27 DIAGNOSIS — I110 Hypertensive heart disease with heart failure: Secondary | ICD-10-CM | POA: Diagnosis not present

## 2022-12-27 DIAGNOSIS — I5023 Acute on chronic systolic (congestive) heart failure: Secondary | ICD-10-CM | POA: Diagnosis not present

## 2022-12-27 DIAGNOSIS — I4719 Other supraventricular tachycardia: Secondary | ICD-10-CM | POA: Diagnosis not present

## 2022-12-27 DIAGNOSIS — J449 Chronic obstructive pulmonary disease, unspecified: Secondary | ICD-10-CM | POA: Diagnosis not present

## 2022-12-27 DIAGNOSIS — I420 Dilated cardiomyopathy: Secondary | ICD-10-CM | POA: Diagnosis not present

## 2022-12-27 NOTE — Progress Notes (Signed)
Cardiology Clinic Note   Patient Name: Mary [name] Date of Encounter: 12/29/2022  Primary Care Provider:  Joselyn Arrow, MD Primary Cardiologist:  Mary Batty, MD  Patient Profile    89 year old female with history of hypertension, paroxysmal atrial tachycardia, hyperlipidemia, breast cancer status mastectomy, osteoporosis, urinary retention with suprapubic catheter and have HFmRF the patient has moderately reduced EF of 45% last seen in the office on 11/22/2022 with multiple noncardiac complaints mostly musculoskeletal concerning osteoarthritis.    She was very frail and not eating very much.  Blood pressure was normal without taking antihypertensive medications.  She was sent over to [facility] for evaluation as she was one of their patients due to her significant complaints of hip pain.  She was recommended to repeat hip injections intermediately, she refused the injections, I was given prescription for tramadol.   Past Medical History    Past Medical History:  Diagnosis Date   Alopecia    Ashkenazi Jewish ancestry    Cancer [facility])    External hemorrhoid    Foley catheter in place    Heart palpitations no cardiologist--  monitored by pcp   per pt "has been on verapamil and lanoxin for years and no palpitations for a very long time"   History of acute bronchitis    dx 12-21-2014  finished [facility]PA[facility]   History of adenomatous polyp of colon    2003   History of basal cell carcinoma excision    2006  left nasolabial fold   History of benign colon tumor    2007  --  HIGH GRADE HYPERPLASTIC ,  S/P  LEFT HEMICOLECTOMY   History of breast cancer ONCOLOGIST-  DR Mary [name]--  antiestogen therapy with femera completed 12/  2012--  no recurrence   dx 10/  2007  right breast Invasive DCIS, grade 2, Stage 2A, pT2  pN0 pMX,  (ER 100% /PR 3% +),  HER +3) with 1 metastatic axill node---  s/p  right mastectomy    History of kidney stones    Hypertension    Hypotonic bladder UROLOGIST-  DR Mary [name]    Mixed hyperlipidemia    OA (osteoarthritis)    hip   Osteoporosis    DEXA 01/2009; T-3.2 L fem neck, -2.4 L radius   Urinary retention    02-13-2015   Past Surgical History:  Procedure Laterality Date   APPENDECTOMY  age 79   BREAST LUMPECTOMY WITH NEEDLE LOCALIZATION AND AXILLARY SENTINEL LYMPH NODE BX Right 01-23-2006   CARPAL TUNNEL RELEASE Right 04-24-2007   and Pulley Release index and small finger   CATARACT EXTRACTION W/ INTRAOCULAR LENS  IMPLANT, BILATERAL  left 1991  &  right 1993   COLONOSCOPY  last one 2013   CYSTOSCOPY N/A 07/12/2015   Procedure: CYSTOSCOPY;  Surgeon: Mary Bolus, MD;  Location: [facility];  Service: Urology;  Laterality: N/A;   CYSTOSCOPY WITH BIOPSY N/A 03/01/2015   Procedure: CYSTOSCOPY WITH TAUBER BIOPSY OF 1CM RIGHT LATERAL BLADDER WALL AND CAUTERIZATION OF BIOPSY SITE ;  Surgeon: Mary Bolus, MD;  Location: [facility];  Service: Urology;  Laterality: N/A;   CYSTOSCOPY WITH LITHOLAPAXY N/A 09/06/2021   Procedure: CYSTOSCOPY WITH LITHOLAPAXY < 2cm, HOLMIUM LASER LITHOTRIPSY, BILATERAL RETROGRADE PYELOGRAM, SUPRAPUBIC CATHETER EXCHANGE;  Surgeon: Mary Field, MD;  Location: [facility];  Service: Urology;  Laterality: N/A;  ONLY NEEDS 90 MIN   INSERTION OF SUPRAPUBIC CATHETER N/A 07/12/2015   Procedure: INSERTION OF SUPRAPUBIC CATHETER;  Surgeon: Mary Bolus, MD;  Location: [facility];  Service: Urology;  Laterality: N/A;   LAPAROSCOPIC ASSISTED LEFT HEMICOLECTOMY  06/  2007   high grade hyperplastic mass   MASTECTOMY Right 02-28-2006   PARTIAL KNEE ARTHROPLASTY Right 01/24/2013   Procedure: RIGHT KNEE MEDIAL UNICOMPARTMENTAL ARTHROPLASTY;  Surgeon: Mary Drilling, MD;  Location: [facility];  Service: Orthopedics;  Laterality: Right;   PULLEY RELEASE LEFT INDEX AND SMALL FINGER  08-07-2007   TONSILLECTOMY  age 30    Allergies  Allergies  Allergen Reactions   Arimidex [Anastrozole] Rash    Avelox [Moxifloxacin Hcl In Nacl] Nausea Only and Rash   Azithromycin Nausea Only and Rash   Bactrim Nausea Only and Rash   Biaxin [Clarithromycin] Nausea Only and Rash   Nitrofurantoin Nausea Only, Rash and Other (See Comments)   Penicillins Nausea Only and Rash   Sulfa Antibiotics Nausea And Vomiting and Rash   Sulfasalazine Rash and Other (See Comments)    GI intolerance    Sulfur Nausea And Vomiting and Rash    History of Present Illness    Mary [name] comes to the office today for follow-up.  She is feeling much better, is not experiencing severity of discomfort in her leg and hip on the right side which led her to be transferred over to orthopedics on last office visit.  She denies any chest pain, palpitations, she is easily short of breath with speaking.  She does have some deconditioning and frailty as she uses a walker for ambulation due to the pain in her legs and right hip.  She stays mostly in her recliner but does sleep in her bed at nighttime.  She denies any PND or orthopnea.  She has been complaining of some restless legs, does take a gabapentin which has been helpful.  She continues to have chronic dependent edema.  She is not wearing compression hose as she finds them to be too uncomfortable.  Home Medications    Current Outpatient Medications  Medication Sig Dispense Refill   acetaminophen (TYLENOL) 500 MG tablet Take 1,000 mg by mouth 2 (two) times daily at 8 am and 10 pm.     atorvastatin (LIPITOR) 40 MG tablet TAKE 1 TABLET(40 MG) BY MOUTH DAILY 90 tablet 3   bisacodyl (DULCOLAX) 5 MG EC tablet Take 5 mg by mouth daily as needed for moderate constipation.     Cholecalciferol (VITAMIN D3) 50 MCG (2000 UT) TABS Take 2,000 Units by mouth daily. 100 tablet 4   denosumab (PROLIA) 60 MG/ML SOLN injection Inject 60 mg into the skin every 6 (six) months. Administer in upper arm, thigh, or abdomen     diazepam (VALIUM) 2 MG tablet TAKE 1 TABLET(2 MG) BY MOUTH EVERY 8 HOURS AS  NEEDED FOR MUSCLE SPASMS 30 tablet 0   fluticasone (CUTIVATE) 0.005 % ointment Apply 1 Application topically 2 (two) times daily.     furosemide (LASIX) 20 MG tablet Take 1 tablet (20 mg total) by mouth 2 (two) times a week. 12 tablet 0   gabapentin (NEURONTIN) 100 MG capsule Take 1 capsule by mouth 30 minutes prior to bedtime.  You may increase to 2 capsules after 1 week, if needed 60 capsule 0   loratadine (CLARITIN) 10 MG tablet Take 10 mg by mouth daily as needed for allergies.     losartan (COZAAR) 25 MG tablet Take 0.5 tablets (12.5 mg total) by mouth daily. 15 tablet 3   metoprolol succinate (TOPROL-XL) 25 MG 24 hr tablet Take 1  tablet (25 mg total) by mouth daily. Take with or immediately following a meal. 30 tablet 3   Multiple Vitamins-Minerals (PRESERVISION AREDS 2) CAPS Take 1 capsule by mouth 2 (two) times daily.     Polyvinyl Alcohol-Povidone (REFRESH OP) Place 1 drop into both eyes 2 (two) times daily.     spironolactone (ALDACTONE) 25 MG tablet Take 0.5 tablets (12.5 mg total) by mouth daily. 15 tablet 3   triamcinolone cream (KENALOG) 0.1 % APPLY TOPICALLY TO THE AFFECTED AREA TWICE DAILY 30 g 0   No current facility-administered medications for this visit.     Family History    Family History  Problem Relation Age of Onset   Hypertension Mother    Stroke Father    Diabetes Father    Hyperlipidemia Brother    Atrial fibrillation Brother    Hyperlipidemia Son    Breast cancer Paternal Aunt        dx 67s; deceased early 36s   Breast cancer Cousin        paternal first cousin   Colon cancer Cousin        paternal first cousin; age dx 15s   She indicated that her mother is deceased. She indicated that her father is deceased. She indicated that her brother is deceased. She indicated that both of her daughters are alive. She indicated that her son is alive. She indicated that the status of her paternal aunt is unknown. She indicated that the status of her cousin is  unknown.  Social History    Social History   Socioeconomic History   Marital status: Widowed    Spouse name: Not on file   Number of children: 3   Years of education: Not on file   Highest education level: Not on file  Occupational History   Not on file  Tobacco Use   Smoking status: Never   Smokeless tobacco: Never  Vaping Use   Vaping status: Never Used  Substance and Sexual Activity   Alcohol use: Never   Drug use: No   Sexual activity: Not Currently    Birth control/protection: Post-menopausal  Other Topics Concern   Not on file  Social History Narrative   Lives alone, no pets.   Children live in [place] and [place]. She has 1 son and 2 daughters; nephew lives in [place].   Widowed January 21, 1993   Her brother passed away (had moved to [place] same time she did)      She no longer drives.   Has caregiver Zella Ball during the week (mornings, sometimes until 4), somebody on 01/22/23 and Sunday evenings (sometimes).      Updated 07/2026   Social Determinants of Health   Financial Resource Strain: Not on file  Food Insecurity: No Food Insecurity (11/06/2022)   Hunger Vital Sign    Worried About Running Out of Food in the Last Year: Never true    Ran Out of Food in the Last Year: Never true  Transportation Needs: No Transportation Needs (11/06/2022)   PRAPARE - Administrator, Civil Service (Medical): No    Lack of Transportation (Non-Medical): No  Physical Activity: Not on file  Stress: Not on file  Social Connections: Not on file  Intimate Partner Violence: Not At Risk (10/31/2022)   Humiliation, Afraid, Rape, and Kick questionnaire    Fear of Current or Ex-Partner: No    Emotionally Abused: No    Physically Abused: No    Sexually Abused: No  Review of Systems    General:  No chills, fever, night sweats or weight changes.  Cardiovascular:  No chest pain, positive for dyspnea on exertion, positive for dependent edema, orthopnea, palpitations, paroxysmal nocturnal  dyspnea. Dermatological: No rash, lesions/masses Respiratory: No cough, dyspnea Urologic: No hematuria, dysuria Abdominal:   No nausea, vomiting, diarrhea, bright red blood per rectum, melena, or hematemesis Neurologic:  No visual changes, wkns, changes in mental status. All other systems reviewed and are otherwise negative except as noted above.       Physical Exam    VS:  BP (!) 140/70 (BP Location: Left Arm, Patient Position: Sitting, Cuff Size: Normal)   Pulse 79   Ht 5\' 3"  (1.6 m)   Wt 125 lb (56.7 kg)   LMP  (LMP Unknown)   BMI 22.14 kg/m  , BMI Body mass index is 22.14 kg/m.     GEN: Well nourished, well developed, in no acute distress. HEENT: normal. Neck: Supple, no JVD, carotid bruits, or masses.   Cardiac: RRR, 2/6 systolic murmurs, rubs, or gallops. No clubbing, cyanosis, 1+ to 2+ dependent bilateral edema.  Radials/DP/PT 2+ and equal bilaterally.  Respiratory:  Respirations regular but labored when she talks for any length of time,, clear to auscultation bilaterally.  I am not hearing any wheezes. GI: Soft, nontender, nondistended, BS + x 4. GU: Suprapubic catheter in place. MS: no deformity or atrophy.   Skin: warm and dry, no rash. Neuro:  Strength and sensation are intact. Psych: Normal affect.      Lab Results  Component Value Date   WBC 7.2 12/18/2022   HGB 14.1 12/18/2022   HCT 43.6 12/18/2022   MCV 88.6 12/18/2022   PLT 233 12/18/2022   Lab Results  Component Value Date   CREATININE 0.56 12/18/2022   BUN 15 12/18/2022   NA 133 (L) 12/18/2022   K 3.5 12/18/2022   CL 95 (L) 12/18/2022   CO2 25 12/18/2022   Lab Results  Component Value Date   ALT 22 12/18/2022   AST 20 12/18/2022   ALKPHOS 76 12/18/2022   BILITOT 1.1 12/18/2022   Lab Results  Component Value Date   CHOL 188 07/26/2022   HDL 95 07/26/2022   LDLCALC 74 07/26/2022   TRIG 111 07/26/2022   CHOLHDL 2.0 07/26/2022    No results found for: "HGBA1C"   Review of Prior  Studies    Echocardiogram 1. Left ventricular ejection fraction, by estimation, is 45%. The left  ventricle has mildly decreased function. The left ventricle has no  regional wall motion abnormalities. Left ventricular diastolic parameters  are indeterminate.   2. Right ventricular systolic function is normal. The right ventricular  size is moderately enlarged.   3. Left atrial size was severely dilated.   4. The mitral valve is degenerative. No evidence of mitral valve  regurgitation. Severe mitral annular calcification.   5. The aortic valve was not well visualized. Aortic valve regurgitation  is not visualized.   6. The inferior vena cava is dilated in size with <50% respiratory  variability, suggesting right atrial pressure of 15 mmHg.Full valve  assessment limited by patients discomfort.   Assessment & Plan   1.  HFmrEF: Most recent echo 10/31/2022 EF of 45% with mildly decreased function.  She is on Lasix 20 mg twice a week.  On review of her labs her potassium is low normal at 3.5.  Due to her worsening restless legs I am going to start her on  a very low-dose of potassium 10 mill equivalents which she is to take with her Lasix.  I want to repeat her BMET as I also have noted that she is on spironolactone to evaluate further with careful evaluation of the potassium status.  2.  Hypertension: Blood pressure slightly elevated today.  Will not make any changes in her medication regimen currently continue losartan 12.5 mg daily, metoprolol 25 mg daily and spironolactone 12.5 mg daily.  Follow-up BMET is pending.  3.  Hypercholesterolemia: Remains on atorvastatin 40 mg daily.  Doubt restless legs are related to myalgia from statin therapy.  Most recent labs on 07/26/2022 total cholesterol 188 HDL 95 LDL 74.  4.  Severe mitral valve calcifications: Noted on echocardiogram 10/31/2022.  Also revealing degenerative mitral valve with no evidence of regurgitation.  Due to frailty uncertain if she  would be a candidate for interventional repair especially with age and other core morbidities. On follow-up with Dr. Allyson Sabal this can be discussed.      I spent over 30 minutes with this patient and her niece who is with her answering questions and providing explanations of medications and treatment from a cardiology standpoint.  Also long discussion of chronic pain medicine and pain control which is to be deferred to her orthopedic physician, Dr. Ethelene Hal, and primary care.  Signed, Bettey Mare. Liborio Nixon, ANP, AACC   12/29/2022 11:31 AM      Office [phone] Fax [phone]  Notice: This dictation was prepared with Dragon dictation along with smaller phrase technology. Any transcriptional errors that result from this process are unintentional and may not be corrected upon review.

## 2022-12-28 ENCOUNTER — Telehealth: Payer: Self-pay | Admitting: Adult Health

## 2022-12-28 ENCOUNTER — Ambulatory Visit: Payer: Medicare Other | Admitting: Cardiology

## 2022-12-28 NOTE — Telephone Encounter (Signed)
New Message:          Daughter is calling. She lives out of town and will not be able to come tomorrow with the patient  for her appointment. She wants to make sure that Samara Deist know that the patient have been really getting short of breath lately.You do not have to call the daughter. She wants to make sure Calllie gets this message.

## 2022-12-29 ENCOUNTER — Ambulatory Visit: Payer: Medicare Other | Attending: Adult Health | Admitting: Adult Health

## 2022-12-29 ENCOUNTER — Encounter: Payer: Self-pay | Admitting: Adult Health

## 2022-12-29 VITALS — BP 140/70 | HR 79 | Ht 63.0 in | Wt 125.0 lb

## 2022-12-29 DIAGNOSIS — I5022 Chronic systolic (congestive) heart failure: Secondary | ICD-10-CM | POA: Insufficient documentation

## 2022-12-29 DIAGNOSIS — E7800 Pure hypercholesterolemia, unspecified: Secondary | ICD-10-CM | POA: Diagnosis not present

## 2022-12-29 DIAGNOSIS — Z9359 Other cystostomy status: Secondary | ICD-10-CM | POA: Insufficient documentation

## 2022-12-29 DIAGNOSIS — R609 Edema, unspecified: Secondary | ICD-10-CM | POA: Diagnosis not present

## 2022-12-29 DIAGNOSIS — I10 Essential (primary) hypertension: Secondary | ICD-10-CM | POA: Insufficient documentation

## 2022-12-29 MED ORDER — POTASSIUM CHLORIDE ER 10 MEQ PO CPCR: 10.0000 meq | ORAL_CAPSULE | ORAL | 2 refills | Status: DC

## 2022-12-29 NOTE — Patient Instructions (Addendum)
Medication Instructions:  Start Micro K ( Take 2 Time a Week.With lasix) *If you need a refill on your cardiac medications before your next appointment, please call your pharmacy*   Lab Work: BMET  If you have labs (blood work) drawn today and your tests are completely normal, you will receive your results only by: MyChart Message (if you have MyChart) OR A paper copy in the mail If you have any lab test that is abnormal or we need to change your treatment, we will call you to review the results.   Testing/Procedures: No Testing   Follow-Up: At [facility], you and your health needs are our priority.  As part of our continuing mission to provide you with exceptional heart care, we have created designated Provider Care Teams.  These Care Teams include your primary Cardiologist (physician) and Advanced Practice Providers (APPs -  Physician Assistants and Nurse Practitioners) who all work together to provide you with the care you need, when you need it.  We recommend signing up for the patient portal called "MyChart".  Sign up information is provided on this After Visit Summary.  MyChart is used to connect with patients for Virtual Visits (Telemedicine).  Patients are able to view lab/test results, encounter notes, upcoming appointments, etc.  Non-urgent messages can be sent to your provider as well.   To learn more about what you can do with MyChart, go to [email or web address].    Your next appointment:   4-6 month(s)  Provider:   Nanetta Batty, MD

## 2022-12-30 ENCOUNTER — Encounter (HOSPITAL_BASED_OUTPATIENT_CLINIC_OR_DEPARTMENT_OTHER): Payer: Self-pay

## 2022-12-30 ENCOUNTER — Emergency Department (HOSPITAL_BASED_OUTPATIENT_CLINIC_OR_DEPARTMENT_OTHER)
Admission: EM | Admit: 2022-12-30 | Discharge: 2022-12-30 | Disposition: A | Discharge status: Home / Self Care | Payer: Medicare Other | Attending: Emergency Medicine | Admitting: Emergency Medicine

## 2022-12-30 ENCOUNTER — Other Ambulatory Visit: Payer: Self-pay

## 2022-12-30 DIAGNOSIS — R000 Tachycardia, unspecified: Secondary | ICD-10-CM | POA: Diagnosis not present

## 2022-12-30 DIAGNOSIS — I509 Heart failure, unspecified: Secondary | ICD-10-CM | POA: Diagnosis not present

## 2022-12-30 DIAGNOSIS — R339 Retention of urine, unspecified: Secondary | ICD-10-CM | POA: Insufficient documentation

## 2022-12-30 DIAGNOSIS — T83098A Other mechanical complication of other indwelling urethral catheter, initial encounter: Secondary | ICD-10-CM | POA: Diagnosis not present

## 2022-12-30 DIAGNOSIS — T83010A Breakdown (mechanical) of cystostomy catheter, initial encounter: Secondary | ICD-10-CM | POA: Diagnosis not present

## 2022-12-30 DIAGNOSIS — Y732 Prosthetic and other implants, materials and accessory gastroenterology and urology devices associated with adverse incidents: Secondary | ICD-10-CM | POA: Insufficient documentation

## 2022-12-30 DIAGNOSIS — Z79899 Other long term (current) drug therapy: Secondary | ICD-10-CM | POA: Diagnosis not present

## 2022-12-30 DIAGNOSIS — I110 Hypertensive heart disease with heart failure: Secondary | ICD-10-CM | POA: Diagnosis not present

## 2022-12-30 LAB — URINALYSIS, ROUTINE W REFLEX MICROSCOPIC
Bilirubin Urine: NEGATIVE
Glucose, UA: NEGATIVE mg/dL
Ketones, ur: NEGATIVE mg/dL
Nitrite: NEGATIVE
Protein, ur: 30 mg/dL — AB
Specific Gravity, Urine: 1.009 (ref 1.005–1.030)
pH: 6.5 (ref 5.0–8.0)

## 2022-12-30 MED ORDER — FENTANYL CITRATE PF 50 MCG/ML IJ SOSY
50.0000 ug | PREFILLED_SYRINGE | Freq: Once | INTRAMUSCULAR | Status: DC
  Filled 2022-12-30: qty 1

## 2022-12-30 MED ORDER — FENTANYL CITRATE PF 50 MCG/ML IJ SOSY
50.0000 ug | PREFILLED_SYRINGE | Freq: Once | INTRAMUSCULAR | Status: AC
  Administered 2022-12-30: 50 ug via INTRAMUSCULAR

## 2022-12-30 NOTE — ED Provider Notes (Signed)
BLADDER CATHETERIZATION  Date/Time: 12/30/2022 5:49 PM  Performed by: Franne Forts, DO Authorized by: Franne Forts, DO   Consent:    Consent obtained:  Verbal   Consent given by:  Patient   Risks, benefits, and alternatives were discussed: yes     Risks discussed:  Pain   Alternatives discussed:  No treatment Universal protocol:    Immediately prior to procedure, a time out was called: yes     Patient identity confirmed:  Verbally with patient, arm band and provided demographic data Procedure details:    Catheter insertion:  Indwelling   Catheter type: suprapubic catheter exchange for acute urinary retention.   Catheter size:  16 Fr   Bladder irrigation: yes     Number of attempts:  1   Urine characteristics:  Yellow Post-procedure details:    Procedure completion:  Tolerated well, no immediate complications      Franne Forts, DO 12/30/22 1750

## 2022-12-30 NOTE — ED Provider Notes (Signed)
[place] EMERGENCY DEPARTMENT AT [facility] Provider Note   CSN: [id number] Arrival date & time: 12/30/22  1556     History  Chief Complaint  Patient presents with   Foley Problem    Mary [name] is a 89 y.o. female.  Patient with history of hypertension, hyperlipidemia, palpitations, urinary retention with suprapubic catheter, CHF with EF of 45% presents today with complaints of problem with her suprapubic catheter.  She notes that for the last few hours she has had worsening severe discomfort in her abdomen that is continued to worsen along with no output from her catheter.  She has been her several times with similar complaints and had to have her catheter changed out.  She does note that some urine is leaking from around her catheter and it is improving her discomfort.  She has reached out to her urologist regarding this and they have discussed placing a larger catheter but have not done this yet.  Patient denies any other complaints.  Specifically, denies any lightheadedness, palpitations, chest pain, shortness of breath, leg pain, or leg swelling.  Does note that she has had this catheter for several years.  The history is provided by the patient. No language interpreter was used.       Home Medications Prior to Admission medications   Medication Sig Start Date End Date Taking? Authorizing Provider  acetaminophen (TYLENOL) 500 MG tablet Take 1,000 mg by mouth 2 (two) times daily at 8 am and 10 pm.    [provider]  atorvastatin (LIPITOR) 40 MG tablet TAKE 1 TABLET(40 MG) BY MOUTH DAILY 09/18/22   Joselyn Arrow, MD  bisacodyl (DULCOLAX) 5 MG EC tablet Take 5 mg by mouth daily as needed for moderate constipation.    [provider]  Cholecalciferol (VITAMIN D3) 50 MCG (2000 UT) TABS Take 2,000 Units by mouth daily. 08/03/20   Magrinat, Valentino Hue, MD  denosumab (PROLIA) 60 MG/ML SOLN injection Inject 60 mg into the skin every 6 (six) months. Administer in  upper arm, thigh, or abdomen 10/24/12   [provider]  diazepam (VALIUM) 2 MG tablet TAKE 1 TABLET(2 MG) BY MOUTH EVERY 8 HOURS AS NEEDED FOR MUSCLE SPASMS 09/27/22   Joselyn Arrow, MD  fluticasone (CUTIVATE) 0.005 % ointment Apply 1 Application topically 2 (two) times daily.    [provider]  furosemide (LASIX) 20 MG tablet Take 1 tablet (20 mg total) by mouth 2 (two) times a week. 11/09/22   Clegg, Amy D, NP  gabapentin (NEURONTIN) 100 MG capsule Take 1 capsule by mouth 30 minutes prior to bedtime.  You may increase to 2 capsules after 1 week, if needed 12/20/22   Joselyn Arrow, MD  loratadine (CLARITIN) 10 MG tablet Take 10 mg by mouth daily as needed for allergies.    [provider]  losartan (COZAAR) 25 MG tablet Take 0.5 tablets (12.5 mg total) by mouth daily. 12/06/22   Laurey Morale, MD  metoprolol succinate (TOPROL-XL) 25 MG 24 hr tablet Take 1 tablet (25 mg total) by mouth daily. Take with or immediately following a meal. 12/06/22   Laurey Morale, MD  Multiple Vitamins-Minerals (PRESERVISION AREDS 2) CAPS Take 1 capsule by mouth 2 (two) times daily.    [provider]  Polyvinyl Alcohol-Povidone (REFRESH OP) Place 1 drop into both eyes 2 (two) times daily.    [provider]  potassium chloride (MICRO-K) 10 MEQ CR capsule Take 1 capsule (10 mEq total) by mouth 2 (  two) times a week. 01/01/23   Jodelle Gross, NP  spironolactone (ALDACTONE) 25 MG tablet Take 0.5 tablets (12.5 mg total) by mouth daily. 12/06/22   Laurey Morale, MD  triamcinolone cream (KENALOG) 0.1 % APPLY TOPICALLY TO THE AFFECTED AREA TWICE DAILY 12/12/22   Joselyn Arrow, MD      Allergies    Arimidex [anastrozole], Avelox [moxifloxacin hcl in nacl], Azithromycin, Bactrim, Biaxin [clarithromycin], Nitrofurantoin, Penicillins, Sulfa antibiotics, Sulfasalazine, and Sulfur    Review of Systems   Review of Systems  All other systems reviewed and are negative.   Physical  Exam Updated Vital Signs BP (!) 144/98   Pulse 78   Temp 98 F (36.7 C)   Resp (!) 22   LMP  (LMP Unknown)   SpO2 92%  Physical Exam Vitals and nursing note reviewed.  Constitutional:      General: She is not in acute distress.    Appearance: Normal appearance. She is normal weight. She is not ill-appearing, toxic-appearing or diaphoretic.  HENT:     Head: Normocephalic and atraumatic.  Cardiovascular:     Rate and Rhythm: Tachycardia present.  Pulmonary:     Effort: Pulmonary effort is normal. No respiratory distress.     Breath sounds: Normal breath sounds.  Abdominal:     Comments: Suprapubic catheter in place, noninfectious appearing without erythema, warmth, fluctuance, or induration.  Urine is leaking from around catheter site.  Abdomen is distended catheter does not appear to be draining and there is sediment in the tubing. Urine in the foley bag is clear and non-infectious appearing  Musculoskeletal:        General: Normal range of motion.     Cervical back: Normal range of motion.  Skin:    General: Skin is warm and dry.  Neurological:     General: No focal deficit present.     Mental Status: She is alert.  Psychiatric:        Mood and Affect: Mood normal.        Behavior: Behavior normal.     ED Results / Procedures / Treatments   Labs (all labs ordered are listed, but only abnormal results are displayed) Labs Reviewed  URINALYSIS, ROUTINE W REFLEX MICROSCOPIC - Abnormal; Notable for the following components:      Result Value   Color, Urine COLORLESS (*)    Hgb urine dipstick SMALL (*)    Protein, ur 30 (*)    Leukocytes,Ua MODERATE (*)    Bacteria, UA RARE (*)    All other components within normal limits  URINE CULTURE    EKG None  Radiology No results found.  Procedures Procedures    Medications Ordered in ED Medications  fentaNYL (SUBLIMAZE) injection 50 mcg (50 mcg Intramuscular Given 12/30/22 1643)    ED Course/ Medical Decision  Making/ A&P                                 Medical Decision Making Amount and/or Complexity of Data Reviewed Labs: ordered.  Risk Prescription drug management.   This patient is a 89 y.o. female who presents to the ED for concern of suprapubic pain, suprapubic catheter problem, this involves an extensive number of treatment options, and is a complaint that carries with it a high risk of complications and morbidity. The emergent differential diagnosis prior to evaluation includes, but is not limited to, Foley catheter obstruction. This is not  an exhaustive differential.   Past Medical History / Co-morbidities / Social History:  has a past medical history of Alopecia, Ashkenazi Jewish ancestry, Cancer [facility]), External hemorrhoid, Foley catheter in place, Heart palpitations (no cardiologist--  monitored by pcp), History of acute bronchitis, History of adenomatous polyp of colon, History of basal cell carcinoma excision, History of benign colon tumor, History of breast cancer (ONCOLOGIST-  DR Darnelle Catalan--  antiestogen therapy with femera completed 12/  2012--  no recurrence), History of kidney stones, Hypertension, Hypotonic bladder (UROLOGIST-  DR Patsi Sears), Mixed hyperlipidemia, OA (osteoarthritis), Osteoporosis, and Urinary retention.  Additional history: Chart reviewed. Pertinent results include: Patient seen here previously for an obstruction in her suprapubic catheter.  When she was here for this that she did have intermittent tachycardia as well which resolved after time.  She is also seeing cardiology for new diagnosis of heart failure, and they are aware of her intermittent tachycardia and have diagnosed her with paroxysmal atrial tachycardia and she is on metoprolol for this.  Discussed with patient, she has been taking her metoprolol as prescribed.  Physical Exam: Physical exam performed. The pertinent findings include: Significant discomfort, suprapubic catheter in place does not appear  to be draining.  No signs of respiratory distress or fluid overload  Lab Tests: I ordered, and personally interpreted labs.  The pertinent results include: UA not obviously infectious, culture pending   Cardiac Monitoring:  The patient was maintained on a cardiac monitor.  My attending physician Dr. Wallace Cullens viewed and interpreted the cardiac monitored which showed an underlying rhythm of: sinus tachycardia. I agree with this interpretation.   Medications: I ordered medication including fentanyl for pain. Reevaluation of the patient after these medicines showed that the patient resolved. I have reviewed the patients home medicines and have made adjustments as needed.   Disposition: After consideration of the diagnostic results and the patients response to treatment, I feel that emergency department workup does not suggest an emergent condition requiring admission or immediate intervention beyond what has been performed at this time. The plan is: Discharge with close cardiology and urology follow-up and return precautions.  Patient's pain has completely resolved after changing out her suprapubic catheter.  Please see my attending Dr. Gwenette Greet note for further details regarding this.  Patient's heart rate was originally in the 150s, likely exacerbated by her pain.  After 30 minutes of monitoring, her heart rate has improved to the 70s.  It does appear that cardiology and her primary doctor are aware of this paroxysmal atrial tachycardia that she has been diagnosed with previously.  She did take her home metoprolol today.  She is without any signs of fluid overload and is not short of breath or having chest pain.  No indication for further evaluation at this time.  Patient feels ready to go home. Evaluation and diagnostic testing in the emergency department does not suggest an emergent condition requiring admission or immediate intervention beyond what has been performed at this time.  Plan for discharge with  close PCP follow-up.  Patient is understanding and amenable with plan, educated on red flag symptoms that would prompt immediate return.  Patient discharged in stable condition.   This is a shared visit with supervising physician Dr. Wallace Cullens who has independently evaluated patient & provided guidance in evaluation/management/disposition, in agreement with care   Final Clinical Impression(s) / ED Diagnoses Final diagnoses:  Urinary retention  Suprapubic catheter dysfunction, initial encounter (HCC)  Tachycardia    Rx / DC Orders ED Discharge  Orders     None     An After Visit Summary was printed and given to the patient.     Silva Bandy, PA-C 12/30/22 1852    Edwin Dada P, DO 01/02/23 1330

## 2022-12-30 NOTE — ED Notes (Signed)
Reviewed AVS/discharge instruction with patient. Time allotted for and all questions answered. Patient is agreeable for d/c and escorted to ed exit by staff.  

## 2022-12-30 NOTE — ED Notes (Signed)
Pt cleaned of urine, dressed and ready for discharge

## 2022-12-30 NOTE — ED Notes (Signed)
Called lab to add on urine culture ?

## 2022-12-30 NOTE — Discharge Instructions (Signed)
As we discussed, we did change out your suprapubic catheter today.  The urine was not obviously infected and we have sent a culture that will show for sure if there is any infection and we will call you with this result if anything grows.  I do recommend following up closely with your urologist to discuss long-term management of this given that this seems to be a recurrent problem.  Please call at your earliest convenience.    Additionally, your heart rate was intermittently high today.  Given that you are asymptomatic with this, no additional evaluation is indicated today.  However I do recommend that you call your cardiologist to make them aware of this and follow-up with them closely outpatient.  Return if development of any new or worsening symptoms.

## 2022-12-30 NOTE — ED Triage Notes (Signed)
Patient arrives with complaints of severe catheter pain/vaginal pain. Rates pain a 10/10.

## 2023-01-01 ENCOUNTER — Ambulatory Visit: Payer: Medicare Other | Admitting: Cardiovascular Disease

## 2023-01-01 ENCOUNTER — Ambulatory Visit: Payer: Medicare Other | Admitting: Cardiology

## 2023-01-02 ENCOUNTER — Telehealth: Payer: Self-pay | Admitting: Cardiovascular Disease

## 2023-01-02 ENCOUNTER — Encounter: Payer: Self-pay | Admitting: Family Medicine

## 2023-01-02 LAB — URINE CULTURE

## 2023-01-02 NOTE — Telephone Encounter (Signed)
Niece Lupita Leash) stated she wants a call back regarding status of patient's clearance as she will be bringing patient to appointments.

## 2023-01-02 NOTE — Telephone Encounter (Signed)
Pt c/o medication issue:  1. Name of Medication:   furosemide (LASIX) 20 MG tablet   2. How are you currently taking this medication (dosage and times per day)?   As prescribed  3. Are you having a reaction (difficulty breathing--STAT)?   4. What is your medication issue?   Niece Lupita Leash) stated she has noticed patient has been having blockages to her catheter when she takes this medication as prescribed (1 tablet two times per week).  Niece wants to know if this medication can be adjusted to less medication spread out over the week to reduce patient's blockage.

## 2023-01-02 NOTE — Telephone Encounter (Signed)
Pre-operative Risk Assessment    Patient Name: Mary [name]  DOB: [birth year] MRN: [id number]      Request for Surgical Clearance    Procedure:   Cystolithopaxy and SP tube change  Date of Surgery:  Clearance 01/23/23                                 Surgeon:  Dr. Mena Goes Surgeon's Group or Practice Name:  [facility] Phone number:  [phone]  Fax number:  [phone]   Type of Clearance Requested:   - Medical    Type of Anesthesia:  MAC   Additional requests/questions:   Caller Shanda Bumps) stated patient will need medical clearance.    Signed, Annetta Maw   01/02/2023, 3:59 PM

## 2023-01-02 NOTE — Telephone Encounter (Signed)
Receive the okay from [name] to submit patient's niece Mary [name] request for medication change. Spoke to patient's niece Mary [name] who wants to see if patient's Furosamide dose can be changed to more often during the week. Per Mary [name] patient has a suprapubic catheter which has been getting clogged lately. She noticed the last to times the catheter was clogged was on the days she took her Furosamide. She reports there is a lot of sediment build up and she is not sure the catheter is getting flushed like it should. Patient is taking Furosamide two times a week and she is hoping it can be change to a lower dose daily or more than 2 days per weak. Please advise.

## 2023-01-03 ENCOUNTER — Other Ambulatory Visit (INDEPENDENT_AMBULATORY_CARE_PROVIDER_SITE_OTHER): Payer: Medicare Other

## 2023-01-03 ENCOUNTER — Other Ambulatory Visit: Payer: Self-pay | Admitting: *Deleted

## 2023-01-03 ENCOUNTER — Other Ambulatory Visit: Payer: Self-pay

## 2023-01-03 DIAGNOSIS — Z5181 Encounter for therapeutic drug level monitoring: Secondary | ICD-10-CM

## 2023-01-03 DIAGNOSIS — Z23 Encounter for immunization: Secondary | ICD-10-CM | POA: Diagnosis not present

## 2023-01-03 MED ORDER — FUROSEMIDE 20 MG PO TABS: 10.0000 mg | ORAL_TABLET | ORAL | 3 refills | Status: DC

## 2023-01-03 NOTE — Telephone Encounter (Signed)
Message relayed to daughter and new prescription for Furosamide sent to pharmacy.

## 2023-01-03 NOTE — Telephone Encounter (Signed)
Spoke to daughter to relay message. She would like a lower dose more frequently. She does not want to give 20 mg every other day she think that would be to much.

## 2023-01-03 NOTE — Progress Notes (Signed)
ED Antimicrobial Stewardship Positive Culture Follow Up   Mary [name] is an 89 y.o. female who presented to [facility] on 12/30/2022 with a chief complaint of  Chief Complaint  Patient presents with   Foley Problem    Recent Results (from the past 720 hour(s))  Urine Culture     Status: Abnormal   Collection Time: 12/30/22  5:05 PM   Specimen: Urine, Suprapubic  Result Value Ref Range Status   Specimen Description   Final    URINE, SUPRAPUBIC Performed at [facility], [address] [place], [place] [address]    Special Requests   Final    NONE Performed at [facility], [address] [place], [place] [address]    Culture >=100,000 COLONIES/mL ENTEROBACTER CLOACAE (A)  Final   Report Status 01/02/2023 FINAL  Final   Organism ID, Bacteria ENTEROBACTER CLOACAE (A)  Final      Susceptibility   Enterobacter cloacae - MIC*    CEFEPIME <=0.12 SENSITIVE Sensitive     CIPROFLOXACIN <=0.25 SENSITIVE Sensitive     GENTAMICIN <=1 SENSITIVE Sensitive     IMIPENEM 1 SENSITIVE Sensitive     NITROFURANTOIN 32 SENSITIVE Sensitive     TRIMETH/SULFA <=20 SENSITIVE Sensitive     PIP/TAZO <=4 SENSITIVE Sensitive ug/mL    * >=100,000 COLONIES/mL ENTEROBACTER CLOACAE    Spoke with patient's niece, Mary [name], who provides care for patient. Patient is no longer having abdominal discomfort and is back to her baseline. Patient has an appointment with urologist office on Thursday, 01/04/23.   Patient discharged originally without antimicrobial agent and no further treatment is indicated at this time  New antibiotic prescription: N/A. Urine culture results faxed to Dr. Mena Goes at [facility]  ED Provider: Charmaine Downs, PharmD, BCPS 01/03/2023, 10:22 AM Clinical Pharmacist Monday - Friday phone -  [phone] Saturday - Sunday phone - [phone]

## 2023-01-04 ENCOUNTER — Telehealth: Payer: Self-pay | Admitting: Family Medicine

## 2023-01-04 LAB — BASIC METABOLIC PANEL
BUN/Creatinine Ratio: 26 (ref 12–28)
BUN: 20 mg/dL (ref 10–36)
CO2: 28 mmol/L (ref 20–29)
Calcium: 9.8 mg/dL (ref 8.7–10.3)
Chloride: 97 mmol/L (ref 96–106)
Creatinine, Ser: 0.77 mg/dL (ref 0.57–1.00)
Glucose: 123 mg/dL — ABNORMAL HIGH (ref 70–99)
Potassium: 4.4 mmol/L (ref 3.5–5.2)
Sodium: 138 mmol/L (ref 134–144)
eGFR: 72 mL/min/{1.73_m2} (ref >59)

## 2023-01-04 NOTE — Telephone Encounter (Addendum)
Niece Lupita Leash called & states pt is sick, nauseous, no vomiting, but can't drink fluids & is concerned about her catheter.  Said doesn't want her to end up in ER again due to catheter stopping up.   Asked if she could bring her in & said couldn't, daughter was at work and pt is with Comptroller.  She asked if [place] or Dr. Lynelle Doctor could call her.  Said she doesn't have any medication for nausea.  Do you want to call her or me to schedule a virtual with daughter for this afternoon?

## 2023-01-04 NOTE — Telephone Encounter (Signed)
Mary [name] will call if she would like virtual.

## 2023-01-04 NOTE — Telephone Encounter (Signed)
Patient Name: Mary [name]  DOB: [birth year] MRN: [id number]  Primary Cardiologist: Nanetta Batty, MD  Chart reviewed as part of pre-operative protocol coverage. Pre-op clearance already addressed by colleagues in earlier phone notes. To summarize recommendations:  - Ok to move forward with procedure. She is better from CV standpoint  -Joni Reining, NP  No medications indicated as needing held.  Will route this bundled recommendation to requesting provider via Epic fax function and remove from pre-op pool. Please call with questions.  Sharlene Dory, PA-C 01/04/2023, 8:09 AM

## 2023-01-04 NOTE — Telephone Encounter (Signed)
I'm happy to do a virtual with her this afternoon if she is feeling badly and wants to talk about it

## 2023-01-04 NOTE — Telephone Encounter (Signed)
Spoke with Mary [name] and [name] had an appt to get her catheter flushed today, SHE canceled it because she didn't feel well. She said urine is running through it. She has a call into the urologist to see if they can get her in tomorrow. She does not think there is a [facility] nurse that does this . This is why they go to uro office 2x a week.

## 2023-01-04 NOTE — Telephone Encounter (Signed)
The question is whether or not the catheter is blocked.  If that's the case, it needs to be unblocked--she can reach out to the urologist office rather than ER. Masking the nausea, but not treating the blockage, would be dangerous. Is a HH nurse able to come today?

## 2023-01-05 ENCOUNTER — Telehealth: Payer: Self-pay | Admitting: Cardiovascular Disease

## 2023-01-05 DIAGNOSIS — N312 Flaccid neuropathic bladder, not elsewhere classified: Secondary | ICD-10-CM | POA: Diagnosis not present

## 2023-01-05 DIAGNOSIS — N210 Calculus in bladder: Secondary | ICD-10-CM | POA: Diagnosis not present

## 2023-01-05 NOTE — Telephone Encounter (Signed)
Spoke with Lupita Leash who wants to know if patient should take Potassium if her level is normal. She is not sure if patient started taking medication. She recently had labs and level was up from 2 weeks ago. She stated patient was started on potassium for restless leg. Please advise.

## 2023-01-05 NOTE — Telephone Encounter (Signed)
Pt c/o medication issue:  1. Name of Medication:   potassium chloride (MICRO-K) 10 MEQ CR capsule   2. How are you currently taking this medication (dosage and times per day)?  As prescribed  3. Are you having a reaction (difficulty breathing--STAT)?   4. What is your medication issue?   Niece Lupita Leash) stated patient had lab work and her potassium is now in the normal range.  Niece wants to know if patient should stop this medication.  Niece also wants to know if patient is cleared for surgery.

## 2023-01-08 NOTE — Telephone Encounter (Signed)
Message relayed to Donna.

## 2023-01-09 ENCOUNTER — Other Ambulatory Visit: Payer: Self-pay | Admitting: Urology

## 2023-01-09 DIAGNOSIS — I5023 Acute on chronic systolic (congestive) heart failure: Secondary | ICD-10-CM | POA: Diagnosis not present

## 2023-01-09 DIAGNOSIS — J9811 Atelectasis: Secondary | ICD-10-CM | POA: Diagnosis not present

## 2023-01-09 DIAGNOSIS — I4719 Other supraventricular tachycardia: Secondary | ICD-10-CM | POA: Diagnosis not present

## 2023-01-09 DIAGNOSIS — I110 Hypertensive heart disease with heart failure: Secondary | ICD-10-CM | POA: Diagnosis not present

## 2023-01-09 DIAGNOSIS — I420 Dilated cardiomyopathy: Secondary | ICD-10-CM | POA: Diagnosis not present

## 2023-01-09 DIAGNOSIS — J449 Chronic obstructive pulmonary disease, unspecified: Secondary | ICD-10-CM | POA: Diagnosis not present

## 2023-01-10 ENCOUNTER — Telehealth: Payer: Self-pay | Admitting: Family Medicine

## 2023-01-10 ENCOUNTER — Encounter: Payer: Self-pay | Admitting: Family Medicine

## 2023-01-10 ENCOUNTER — Other Ambulatory Visit: Payer: Self-pay | Admitting: Urology

## 2023-01-10 ENCOUNTER — Other Ambulatory Visit: Payer: Self-pay | Admitting: Family Medicine

## 2023-01-10 DIAGNOSIS — M25551 Pain in right hip: Secondary | ICD-10-CM | POA: Diagnosis not present

## 2023-01-10 DIAGNOSIS — I5023 Acute on chronic systolic (congestive) heart failure: Secondary | ICD-10-CM

## 2023-01-10 DIAGNOSIS — Z9359 Other cystostomy status: Secondary | ICD-10-CM

## 2023-01-10 NOTE — Telephone Encounter (Signed)
I guess we need to clarify what nursing needs are needed. Originally it was to have the catheter flushed 2x/week, but I think you told me they wouldn't do that, so she has to go to the urologist 2x/week. We need to figure out what needs to be addressed by skilled nursing. Is she still getting any PT at home? I can't recall exactly what had been set up. Look at the forms I just signed the other day--where I put it for 2x/week--I didn't think they said anything about discharge

## 2023-01-10 NOTE — Telephone Encounter (Signed)
Spoke with Mary [name] and let her know that there are no CNA's that can flush foley cath's. They can undo the discharge and send an RN back out but only with the intent of teaching someone, not long term. Mary [name] said there is not someone there to learn. I did let her know that an urgent CCM referral was placed and they will wait for someone to reach out and help. If anything changes she will call me back and I will call Jenna/Angela w [facility] [phone] to undo the order and have them send someone back out.

## 2023-01-10 NOTE — Telephone Encounter (Signed)
Mary [name] and Susie called and asks if you can give Susie a call when available. I tried to help but they asked for you. Provided call back number [phone]

## 2023-01-10 NOTE — Telephone Encounter (Signed)
Either re-enter new order for [facility], or call them and ask if they can have someone do what is being asked for a while. If they refuse (not appropriate for [facility]), then we need to get CCM involved to find someone who CAN do what they are asking (flushing catheter). I believe a referral was placed to CCM in August. If I need a new one, let me know. Make sure they have [name] and/or [employer] as contacts (or else they will call the patient, who might not give the proper info).

## 2023-01-11 ENCOUNTER — Telehealth: Payer: Self-pay | Admitting: *Deleted

## 2023-01-11 NOTE — Progress Notes (Signed)
Care Coordination   Note   01/11/2023 Name: Corlis Stfleur MRN: [id number] DOB: 1926/07/06  Viviann Sanagustin is a 89 y.o. year old female who sees Joselyn Arrow, MD for primary care. I reached out to [employer] by phone today to offer care coordination services.  Ms. Homsey was given information about Care Coordination services today including:   The Care Coordination services include support from the care team which includes your Nurse Coordinator, Clinical Social Worker, or Pharmacist.  The Care Coordination team is here to help remove barriers to the health concerns and goals most important to you. Care Coordination services are voluntary, and the patient may decline or stop services at any time by request to their care team member.   Care Coordination Consent Status: Patient agreed to services and verbal consent obtained.   Follow up plan:  Telephone appointment with care coordination team member scheduled for:  10/31    Encounter Outcome:  Patient Scheduled  [facility] Coordination Care Guide  Direct Dial: [phone]

## 2023-01-16 NOTE — Patient Instructions (Signed)
SURGICAL WAITING ROOM VISITATION  Patients having surgery or a procedure may have no more than 2 support people in the waiting area - these visitors may rotate.    Children under the age of 59 must have an adult with them who is not the patient.  Due to an increase in RSV and influenza rates and associated hospitalizations, children ages 61 and under may not visit patients in [facility] hospitals.  If the patient needs to stay at the hospital during part of their recovery, the visitor guidelines for inpatient rooms apply. Pre-op nurse will coordinate an appropriate time for 1 support person to accompany patient in pre-op.  This support person may not rotate.    Please refer to the [facility] website for the visitor guidelines for Inpatients (after your surgery is over and you are in a regular room).       Your procedure is scheduled on: 01/23/23   Report to [facility] Main [facility]tr[facility]    Report to admitting at  6:45 AM   Call this number if you have problems the morning of surgery [phone]   Do not eat food  or drink liquids :After Midnight.      Oral Hygiene is also important to reduce your risk of infection.                                    Remember - BRUSH YOUR TEETH THE MORNING OF SURGERY WITH YOUR REGULAR TOOTHPASTE  DENTURES WILL BE REMOVED PRIOR TO SURGERY PLEASE DO NOT APPLY "Poly grip" OR ADHESIVES!!!   Stop all vitamins and herbal supplements 7 days before surgery.   Take these medicines the morning of surgery with A SIP OF WATER:              You may not have any metal on your body including hair pins, jewelry, and body piercing             Do not wear make-up, lotions, powders, perfumes/cologne, or deodorant  Do not wear nail polish including gel and S&S, artificial/acrylic nails, or any other type of covering on natural nails including finger and toenails. If you have artificial nails, gel coating, etc. that needs to be removed by a nail salon please  have this removed prior to surgery or surgery may need to be canceled/ delayed if the surgeon/ anesthesia feels like they are unable to be safely monitored.   Do not shave  48 hours prior to surgery.    Do not bring valuables to the hospital. [place] IS NOT             RESPONSIBLE   FOR VALUABLES.   Contacts, glasses, dentures or bridgework may not be worn into surgery.  DO NOT BRING YOUR HOME MEDICATIONS TO THE HOSPITAL. PHARMACY WILL DISPENSE MEDICATIONS LISTED ON YOUR MEDICATION LIST TO YOU DURING YOUR ADMISSION IN THE HOSPITAL!    Patients discharged on the day of surgery will not be allowed to drive home.  Someone NEEDS to stay with you for the first 24 hours after anesthesia.   Special Instructions: Bring a copy of your healthcare power of attorney and living will documents the day of surgery if you haven't scanned them before.              Please read over the following fact sheets you were given: IF YOU HAVE QUESTIONS ABOUT YOUR PRE-OP  INSTRUCTIONS PLEASE CALL [phone] Mary [name]   If you received a COVID test during your pre-op visit  it is requested that you wear a mask when out in public, stay away from anyone that may not be feeling well and notify your surgeon if you develop symptoms. If you test positive for Covid or have been in contact with anyone that has tested positive in the last 10 days please notify you surgeon.    [facility] - Preparing for Surgery Before surgery, you can play an important role.  Because skin is not sterile, your skin needs to be as free of germs as possible.  You can reduce the number of germs on your skin by washing with CHG (chlorahexidine gluconate) soap before surgery.  CHG is an antiseptic cleaner which kills germs and bonds with the skin to continue killing germs even after washing. Please DO NOT use if you have an allergy to CHG or antibacterial soaps.  If your skin becomes reddened/irritated stop using the CHG and inform your nurse when you  arrive at Short Stay. Do not shave (including legs and underarms) for at least 48 hours prior to the first CHG shower.  You may shave your face/neck.  Please follow these instructions carefully:  1.  Shower with CHG Soap the night before surgery and the  morning of surgery.  2.  If you choose to wash your hair, wash your hair first as usual with your normal  shampoo.  3.  After you shampoo, rinse your hair and body thoroughly to remove the shampoo.                             4.  Use CHG as you would any other liquid soap.  You can apply chg directly to the skin and wash.  Gently with a scrungie or clean washcloth.  5.  Apply the CHG Soap to your body ONLY FROM THE NECK DOWN.   Do   not use on face/ open                           Wound or open sores. Avoid contact with eyes, ears mouth and   genitals (private parts).                       Wash face,  Genitals (private parts) with your normal soap.             6.  Wash thoroughly, paying special attention to the area where your    surgery  will be performed.  7.  Thoroughly rinse your body with warm water from the neck down.  8.  DO NOT shower/wash with your normal soap after using and rinsing off the CHG Soap.                9.  Pat yourself dry with a clean towel.            10.  Wear clean pajamas.            11.  Place clean sheets on your bed the night of your first shower and do not  sleep with pets. Day of Surgery : Do not apply any lotions/deodorants the morning of surgery.  Please wear clean clothes to the hospital/surgery center.  FAILURE TO FOLLOW THESE INSTRUCTIONS MAY RESULT IN THE CANCELLATION OF YOUR SURGERY  PATIENT SIGNATURE_________________________________  NURSE SIGNATURE__________________________________  ________________________________________________________________________

## 2023-01-16 NOTE — Progress Notes (Signed)
COVID Vaccine received:  []  No [x]  Yes Date of any COVID positive Test in last 90 days: no PCP - Joselyn Arrow MD Cardiologist - Nanetta Batty MD  Cardiac clearance Joni Reining NP  Chest x-ray - 10/30/22 Epic EKG -  01/02/23 Epic Stress Test  ECHO -10/31/22 Epic  Cardiac Cath -   Bowel Prep - [x]  No  []   Yes ______  Pacemaker / ICD device [x]  No []  Yes   Spinal Cord Stimulator:[x]  No []  Yes       History of Sleep Apnea? [x]  No []  Yes   CPAP used?- [x]  No []  Yes    Does the patient monitor blood sugar?          [x]  No []  Yes  []  N/A  Patient has: [x]  NO Hx DM   []  Pre-DM                 []  DM1  []   DM2 Does patient have a [employer] or Dexacom? []  No []  Yes   Fasting Blood Sugar Ranges-  Checks Blood Sugar _____ times a day  GLP1 agonist / usual dose - no GLP1 instructions:  SGLT-2 inhibitors / usual dose - no SGLT-2 instructions:   Blood Thinner / Instructions:no Aspirin Instructions:no  Comments:   Activity level: Patient is unable to climb a flight of stairs without difficulty; [x]  No CP  [x]  No SOB,___   Patient  can not perform ADLs without assistance.   Anesthesia review: HTN,CHF, R BBB, LAFB, Dilated cardiomyopathy  Patient denies shortness of breath, fever, cough and chest pain at PAT appointment.  Patient verbalized understanding and agreement to the Pre-Surgical Instructions that were given to them at this PAT appointment. Patient was also educated of the need to review these PAT instructions again prior to his/her surgery.I reviewed the appropriate phone numbers to call if they have any and questions or concerns.

## 2023-01-17 ENCOUNTER — Encounter (HOSPITAL_COMMUNITY): Payer: Self-pay

## 2023-01-17 ENCOUNTER — Encounter (HOSPITAL_COMMUNITY)
Admission: RE | Admit: 2023-01-17 | Discharge: 2023-01-17 | Disposition: A | Discharge status: Home / Self Care | Payer: Medicare Other | Source: Ambulatory Visit | Attending: Urology | Admitting: Urology

## 2023-01-17 ENCOUNTER — Other Ambulatory Visit: Payer: Self-pay

## 2023-01-17 VITALS — BP 137/85 | HR 78 | Temp 98.0°F | Resp 20 | Ht 63.0 in | Wt 120.0 lb

## 2023-01-17 DIAGNOSIS — I5022 Chronic systolic (congestive) heart failure: Secondary | ICD-10-CM | POA: Insufficient documentation

## 2023-01-17 DIAGNOSIS — N312 Flaccid neuropathic bladder, not elsewhere classified: Secondary | ICD-10-CM | POA: Diagnosis not present

## 2023-01-17 DIAGNOSIS — I110 Hypertensive heart disease with heart failure: Secondary | ICD-10-CM | POA: Insufficient documentation

## 2023-01-17 DIAGNOSIS — Z01812 Encounter for preprocedural laboratory examination: Secondary | ICD-10-CM | POA: Diagnosis not present

## 2023-01-17 DIAGNOSIS — N210 Calculus in bladder: Secondary | ICD-10-CM | POA: Diagnosis not present

## 2023-01-17 DIAGNOSIS — I10 Essential (primary) hypertension: Secondary | ICD-10-CM

## 2023-01-17 HISTORY — DX: Dyspnea, unspecified: R06.00

## 2023-01-17 LAB — CBC
HCT: 40.8 % (ref 36.0–46.0)
Hemoglobin: 12.9 g/dL (ref 12.0–15.0)
MCH: 28.6 pg (ref 26.0–34.0)
MCHC: 31.6 g/dL (ref 30.0–36.0)
MCV: 90.5 fL (ref 80.0–100.0)
Platelets: 276 10*3/uL (ref 150–400)
RBC: 4.51 MIL/uL (ref 3.87–5.11)
RDW: 15.5 % (ref 11.5–15.5)
WBC: 9 10*3/uL (ref 4.0–10.5)
nRBC: 0 % (ref 0.0–0.2)

## 2023-01-18 ENCOUNTER — Ambulatory Visit: Payer: Self-pay

## 2023-01-18 ENCOUNTER — Ambulatory Visit: Payer: Self-pay | Admitting: Licensed Clinical Social Worker

## 2023-01-18 NOTE — Progress Notes (Signed)
Anesthesia Chart Review   Case: [id number] Date/Time: 01/23/23 0845   Procedures:      CYSTOSCOPY WITH LITHOLAPAXY     INSERTION OF SUPRAPUBIC CATHETER - 75 MINS   Anesthesia type: Monitor Anesthesia Care   Pre-op diagnosis: BLADDER STONE HYPOTONIC BLADDER   Location: [facility] PROC[facility]   Surgeons: Jerilee Field, MD       DISCUSSION:89 y.o. never smoker with h/o HTN, paroxysmal tachycardia, HFmrEF , breast cancer status mastectomy, bladder stone scheduled for above procedure 01/23/2023 with Dr. Mena Goes.   Per cardiology preoperative evaluation 01/04/2023, "Chart reviewed as part of pre-operative protocol coverage. Pre-op clearance already addressed by colleagues in earlier phone notes. To summarize recommendations:  Ok to move forward with procedure. She is better from CV standpoint  -Joni Reining, NP   No medications indicated as needing held."  Per cardiology note 12/29/2022, "Severe mitral valve calcifications: Noted on echocardiogram 10/31/2022.  Also revealing degenerative mitral valve with no evidence of regurgitation.  Due to frailty uncertain if she would be a candidate for interventional repair especially with age and other core morbidities. On follow-up with Dr. Allyson Sabal this can be discussed."      VS: BP 137/85   Pulse 78   Temp 36.7 C (Oral)   Resp 20   Ht 5\' 3"  (1.6 m)   Wt 54.4 kg   LMP  (LMP Unknown)   SpO2 98%   BMI 21.26 kg/m   PROVIDERS: Joselyn Arrow, MD is PCP   Primary Cardiologist:  Nanetta Batty, MD  LABS: Labs reviewed: Acceptable for surgery. (all labs ordered are listed, but only abnormal results are displayed)  Labs Reviewed  CBC     IMAGES:   EKG:   CV: Echo 10/31/2022 1. Left ventricular ejection fraction, by estimation, is 45%. The left  ventricle has mildly decreased function. The left ventricle has no  regional wall motion abnormalities. Left ventricular diastolic parameters  are indeterminate.   2. Right  ventricular systolic function is normal. The right ventricular  size is moderately enlarged.   3. Left atrial size was severely dilated.   4. The mitral valve is degenerative. No evidence of mitral valve  regurgitation. Severe mitral annular calcification.   5. The aortic valve was not well visualized. Aortic valve regurgitation  is not visualized.   6. The inferior vena cava is dilated in size with <50% respiratory  variability, suggesting right atrial pressure of 15 mmHg.Full valve  assessment limited by patients discomfort.   Past Medical History:  Diagnosis Date   Alopecia    Ashkenazi Jewish ancestry    Cancer [facility])    Dyspnea    External hemorrhoid    Foley catheter in place    Heart palpitations no cardiologist--  monitored by pcp   per pt "has been on verapamil and lanoxin for years and no palpitations for a very long time"   History of acute bronchitis    dx 12-21-2014  finished [facility]PAK   History of adenomatous polyp of colon    2003   History of basal cell carcinoma excision    2006  left nasolabial fold   History of benign colon tumor    2007  --  HIGH GRADE HYPERPLASTIC ,  S/P  LEFT HEMICOLECTOMY   History of breast cancer ONCOLOGIST-  DR Darnelle Catalan--  antiestogen therapy with femera completed 12/  2012--  no recurrence   dx 10/  2007  right breast Invasive DCIS, grade 2, Stage 2A, pT2  pN0 pMX,  (ER 100% /PR 3% +),  HER +3) with 1 metastatic axill node---  s/p  right mastectomy    History of kidney stones    Hypertension    Hypotonic bladder UROLOGIST-  DR Patsi Sears   Mixed hyperlipidemia    OA (osteoarthritis)    hip   Osteoporosis    DEXA 01/2009; T-3.2 L fem neck, -2.4 L radius   Urinary retention    02-13-2015    Past Surgical History:  Procedure Laterality Date   APPENDECTOMY  age 38   BREAST LUMPECTOMY WITH NEEDLE LOCALIZATION AND AXILLARY SENTINEL LYMPH NODE BX Right 01-23-2006   CARPAL TUNNEL RELEASE Right 04-24-2007   and Pulley Release index and small  finger   CATARACT EXTRACTION W/ INTRAOCULAR LENS  IMPLANT, BILATERAL  left 1991  &  right 1993   COLONOSCOPY  last one 2013   CYSTOSCOPY N/A 07/12/2015   Procedure: CYSTOSCOPY;  Surgeon: Jethro Bolus, MD;  Location: [facility] [place];  Service: Urology;  Laterality: N/A;   CYSTOSCOPY WITH BIOPSY N/A 03/01/2015   Procedure: CYSTOSCOPY WITH TAUBER BIOPSY OF 1CM RIGHT LATERAL BLADDER WALL AND CAUTERIZATION OF BIOPSY SITE ;  Surgeon: Jethro Bolus, MD;  Location: [facility] [place];  Service: Urology;  Laterality: N/A;   CYSTOSCOPY WITH LITHOLAPAXY N/A 09/06/2021   Procedure: CYSTOSCOPY WITH LITHOLAPAXY < 2cm, HOLMIUM LASER LITHOTRIPSY, BILATERAL RETROGRADE PYELOGRAM, SUPRAPUBIC CATHETER EXCHANGE;  Surgeon: Jerilee Field, MD;  Location[facility];  Service: Urology;  Laterality: N/A;  ONLY NEEDS 90 MIN   INSERTION OF SUPRAPUBIC CATHETER N/A 07/12/2015   Procedure: INSERTION OF SUPRAPUBIC CATHETER;  Surgeon: Jethro Bolus, MD;  Location: [facility];  Service: Urology;  Laterality: N/A;   LAPAROSCOPIC ASSISTED LEFT HEMICOLECTOMY  06/  2007   high grade hyperplastic mass   MASTECTOMY Right 02-28-2006   PARTIAL KNEE ARTHROPLASTY Right 01/24/2013   Procedure: RIGHT KNEE MEDIAL UNICOMPARTMENTAL ARTHROPLASTY;  Surgeon: Loanne Drilling, MD;  [facility];  Service: Orthopedics;  Laterality: Right;   PULLEY RELEASE LEFT INDEX AND SMALL FINGER  08-07-2007   TONSILLECTOMY  age 61    MEDICATIONS:  acetaminophen (TYLENOL) 500 MG tablet   atorvastatin (LIPITOR) 40 MG tablet   bisacodyl (DULCOLAX) 5 MG EC tablet   Cholecalciferol (VITAMIN D3) 50 MCG (2000 UT) TABS   denosumab (PROLIA) 60 MG/ML SOLN injection   diazepam (VALIUM) 2 MG tablet   fluticasone (CUTIVATE) 0.005 % ointment   furosemide (LASIX) 20 MG tablet   gabapentin (NEURONTIN) 100 MG capsule   loratadine (CLARITIN) 10 MG tablet   losartan (COZAAR) 25 MG tablet   metoprolol succinate  (TOPROL-XL) 25 MG 24 hr tablet   Multiple Vitamins-Minerals (PRESERVISION AREDS 2) CAPS   Polyvinyl Alcohol-Povidone (REFRESH OP)   potassium chloride (MICRO-K) 10 MEQ CR capsule   spironolactone (ALDACTONE) 25 MG tablet   triamcinolone cream (KENALOG) 0.1 %   No current facility-administered medications for this encounter.   Jodell Cipro Ward, PA-C [facility] Pre-Surgical Testing [phone]

## 2023-01-19 NOTE — Patient Instructions (Signed)
Visit Information  Thank you for taking time to visit with me today. Please don't hesitate to contact me if I can be of assistance to you.   Following are the goals we discussed today:   Goals Addressed             This Visit's Progress    To undergo cystoscopy and suprapubic catheter placement without complications       Care Coordination Interventions: Completed outbound call with patient's daughters Lynnell Dike and Rosann Auerbach Evaluation of current treatment plan related to urinary retention and patient's adherence to plan as established by provider Determined patient is scheduled to undergo a cystoscopy with new suprapubic catheter placement on 01/23/23 Answered sisters questions about the procedure and what to expect  Determined patient will return to her home following the procedure, she has private paid caregivers  Discussed daughters Lynnell Dike and Rosann Auerbach live in [place] and [place] and occasionally visit, they help coordinate patient's care remotely  Educated daughters about suprapubic catheter placement and care Instructed daughters to make a list of questions and or concerns to discuss with patient's doctor following the procedure Discussed plans with patient for ongoing care coordination follow up and provided patient with direct contact information for nurse care coordinator            Our next appointment is by telephone on 01/25/23 at 1:00 PM  Please call the care guide team at [phone] if you need to cancel or reschedule your appointment.   If you are experiencing a Mental Health or Behavioral Health Crisis or need someone to talk to, please call 1-800-273-TALK (toll free, 24 hour hotline)  Patient verbalizes understanding of instructions and care plan provided today and agrees to view in MyChart. Active MyChart status and patient understanding of how to access instructions and care plan via MyChart confirmed with patient.     Delsa Sale RN BSN CCM [place]  [facility], [facility] Nurse Care Coordinator  Direct Dial: [phone] Website: [email or web address]

## 2023-01-19 NOTE — Patient Instructions (Signed)
Visit Information  Thank you for taking time to visit with me today. Please don't hesitate to contact me if I can be of assistance to you.   Following are the goals we discussed today:   Goals Addressed             This Visit's Progress    Obtain Supportive Resources   On track    Activities and task to complete in order to accomplish goals.   Keep all upcoming appointments discussed today Continue with compliance of taking medication prescribed by Doctor Implement healthy coping skills discussed to assist with management of symptoms Continue working with [facility] care team to assist with goals identified         Our next appointment is by telephone on 11/12 at 1:30 PM  Please call the care guide team at [phone] if you need to cancel or reschedule your appointment.   If you are experiencing a Mental Health or Behavioral Health Crisis or need someone to talk to, please call the Suicide and Crisis Lifeline: 988 call 911   Patient verbalizes understanding of instructions and care plan provided today and agrees to view in MyChart. Active MyChart status and patient understanding of how to access instructions and care plan via MyChart confirmed with patient.     [name], MSW, LCSW [facility] Care Management [place]  [email or web address] Phone [phone] 5:47 AM

## 2023-01-19 NOTE — Patient Outreach (Signed)
  Care Coordination   Initial Visit Note   01/18/2023 Name: Alex Mcmanigal MRN: [id number] DOB: May 16, 1926  Mary [name] is a 89 y.o. year old female who sees Joselyn Arrow, MD for primary care. I spoke with  Eston Esters adult daughters, [name] and [name], by phone today.  What matters to the patients health and wellness today?  Housing, Level of Care, Supportive Resources    Goals Addressed             This Visit's Progress    Obtain Supportive Resources   On track    Activities and task to complete in order to accomplish goals.   Keep all upcoming appointments discussed today Continue with compliance of taking medication prescribed by Doctor Implement healthy coping skills discussed to assist with management of symptoms Continue working with [facility] care team to assist with goals identified         SDOH assessments and interventions completed:  Yes  SDOH Interventions Today    Flowsheet Row Most Recent Value  SDOH Interventions   Food Insecurity Interventions Intervention Not Indicated  Housing Interventions Intervention Not Indicated  Transportation Interventions Intervention Not Indicated        Care Coordination Interventions:  Yes, provided  Interventions Today    Flowsheet Row Most Recent Value  Chronic Disease   Chronic disease during today's visit Hypertension (HTN), Congestive Heart Failure (CHF)  General Interventions   General Interventions Discussed/Reviewed General Interventions Discussed, Doctor Visits, Community Resources, Level of Care  Doctor Visits Discussed/Reviewed Doctor Visits Discussed  Level of Care Adult Daycare, Assisted Living, Personal Care Services  Mental Health Interventions   Mental Health Discussed/Reviewed Mental Health Discussed, Coping Strategies  [Family endorsed overwhelm with managing pt's chronic health conditions from [place] of [place]. Family are paying for personal aids out of pocket to maintain safety in the home. They are f/up  with [facility]]  Nutrition Interventions   Nutrition Discussed/Reviewed Nutrition Discussed  Pharmacy Interventions   Pharmacy Dicussed/Reviewed Pharmacy Topics Discussed, Medication Adherence  [Pt's Niece in Law will continue to pick up pt's meds from preferred pharmacy. Pt is not interested in pill packs or med delivery]  Safety Interventions   Safety Discussed/Reviewed Safety Discussed       Follow up plan: Follow up call scheduled for 2-4 weeks    Encounter Outcome:  Patient Visit Completed   [name], MSW, LCSW [facility] Care Management [facility]  [email or web address] Phone [phone] 5:47 AM

## 2023-01-19 NOTE — Patient Outreach (Signed)
  Care Coordination   Follow Up Visit Note   01/18/2023 Name: Mary [name] MRN: [id number] DOB: 12-Oct-1926  Mary [name] is a 89 y.o. year old female who sees Joselyn Arrow, MD for primary care. I spoke with daughters Mary [name] and Mary [name] by phone today.  What matters to the patients health and wellness today?  Patient will undergo upcoming procedure without complications.     Goals Addressed             This Visit's Progress    To undergo cystoscopy and suprapubic catheter placement without complications       Care Coordination Interventions: Completed outbound call with patient's daughters Mary [name] and Mary [name] Evaluation of current treatment plan related to urinary retention and patient's adherence to plan as established by provider Determined patient is scheduled to undergo a cystoscopy with new suprapubic catheter placement on 01/23/23 Answered sisters questions about the procedure and what to expect  Determined patient will return to her home following the procedure, she has private paid caregivers  Discussed daughters Mary [name] and Mary [name] live in [place] and [place] and occasionally visit, they help coordinate patient's care remotely  Educated daughters about suprapubic catheter placement and care Instructed daughters to make a list of questions and or concerns to discuss with patient's doctor following the procedure Discussed plans with patient for ongoing care coordination follow up and provided patient with direct contact information for nurse care coordinator      Interventions Today    Flowsheet Row Most Recent Value  Chronic Disease   Chronic disease during today's visit Other, Congestive Heart Failure (CHF), Atrial Fibrillation (AFib)  [urinary retention,  suprapubic catheter]  General Interventions   General Interventions Discussed/Reviewed General Interventions Discussed, General Interventions Reviewed, Doctor Visits  Doctor Visits Discussed/Reviewed Doctor Visits Discussed, Doctor  Visits Reviewed, PCP, Specialist  Education Interventions   Education Provided Provided Education  Provided Verbal Education On When to see the doctor  Safety Interventions   Safety Discussed/Reviewed Home Safety            SDOH assessments and interventions completed:  No     Care Coordination Interventions:  Yes, provided   Follow up plan: Follow up call scheduled for 01/25/23 @1 :00 PM     Encounter Outcome:  Patient Visit Completed

## 2023-01-23 ENCOUNTER — Encounter (HOSPITAL_COMMUNITY): Payer: Self-pay | Admitting: Urology

## 2023-01-23 ENCOUNTER — Ambulatory Visit (HOSPITAL_COMMUNITY)
Admission: RE | Admit: 2023-01-23 | Discharge: 2023-01-23 | Disposition: A | Discharge status: Home / Self Care | Payer: Medicare Other | Attending: Urology | Admitting: Urology

## 2023-01-23 ENCOUNTER — Ambulatory Visit (HOSPITAL_COMMUNITY): Payer: Medicare Other | Admitting: Physician Assistant

## 2023-01-23 ENCOUNTER — Ambulatory Visit (HOSPITAL_COMMUNITY): Payer: Medicare Other | Admitting: Anesthesiology

## 2023-01-23 ENCOUNTER — Encounter (HOSPITAL_COMMUNITY)
Admission: RE | Disposition: A | Discharge status: Home / Self Care | Payer: Self-pay | Source: Home / Self Care | Attending: Urology

## 2023-01-23 DIAGNOSIS — I480 Paroxysmal atrial fibrillation: Secondary | ICD-10-CM | POA: Diagnosis not present

## 2023-01-23 DIAGNOSIS — Z960 Presence of urogenital implants: Secondary | ICD-10-CM | POA: Diagnosis not present

## 2023-01-23 DIAGNOSIS — I110 Hypertensive heart disease with heart failure: Secondary | ICD-10-CM | POA: Diagnosis not present

## 2023-01-23 DIAGNOSIS — N3289 Other specified disorders of bladder: Secondary | ICD-10-CM | POA: Insufficient documentation

## 2023-01-23 DIAGNOSIS — N319 Neuromuscular dysfunction of bladder, unspecified: Secondary | ICD-10-CM | POA: Insufficient documentation

## 2023-01-23 DIAGNOSIS — I10 Essential (primary) hypertension: Secondary | ICD-10-CM

## 2023-01-23 DIAGNOSIS — N210 Calculus in bladder: Secondary | ICD-10-CM | POA: Insufficient documentation

## 2023-01-23 DIAGNOSIS — I509 Heart failure, unspecified: Secondary | ICD-10-CM | POA: Diagnosis not present

## 2023-01-23 DIAGNOSIS — I5023 Acute on chronic systolic (congestive) heart failure: Secondary | ICD-10-CM | POA: Diagnosis not present

## 2023-01-23 DIAGNOSIS — Z435 Encounter for attention to cystostomy: Secondary | ICD-10-CM | POA: Insufficient documentation

## 2023-01-23 DIAGNOSIS — Z466 Encounter for fitting and adjustment of urinary device: Secondary | ICD-10-CM

## 2023-01-23 HISTORY — PX: INSERTION OF SUPRAPUBIC CATHETER: SHX5870

## 2023-01-23 HISTORY — PX: CYSTOSCOPY WITH LITHOLAPAXY: SHX1425

## 2023-01-23 SURGERY — CYSTOSCOPY, WITH BLADDER CALCULUS LITHOLAPAXY
Anesthesia: Monitor Anesthesia Care | Site: Bladder

## 2023-01-23 MED ORDER — PROPOFOL 10 MG/ML IV BOLUS
INTRAVENOUS | Status: DC | PRN
  Administered 2023-01-23 (×4): 10 mg via INTRAVENOUS

## 2023-01-23 MED ORDER — SODIUM CHLORIDE 0.9 % IV SOLN
2.0000 g | Freq: Once | INTRAVENOUS | Status: AC
  Administered 2023-01-23: 2 g via INTRAVENOUS
  Filled 2023-01-23: qty 12.5

## 2023-01-23 MED ORDER — CHLORHEXIDINE GLUCONATE 0.12 % MT SOLN
15.0000 mL | Freq: Once | OROMUCOSAL | Status: AC
  Administered 2023-01-23: 15 mL via OROMUCOSAL

## 2023-01-23 MED ORDER — SODIUM CHLORIDE 0.9 % IV SOLN
2.0000 g | Freq: Once | INTRAVENOUS | Status: DC
  Filled 2023-01-23: qty 20

## 2023-01-23 MED ORDER — LIDOCAINE HCL URETHRAL/MUCOSAL 2 % EX GEL
CUTANEOUS | Status: DC | PRN
  Administered 2023-01-23: 1

## 2023-01-23 MED ORDER — SODIUM CHLORIDE 0.9 % IR SOLN
Status: DC | PRN
  Administered 2023-01-23: 3000 mL

## 2023-01-23 MED ORDER — FENTANYL CITRATE PF 50 MCG/ML IJ SOSY: 25.0000 ug | PREFILLED_SYRINGE | INTRAMUSCULAR | Status: DC | PRN

## 2023-01-23 MED ORDER — PROPOFOL 10 MG/ML IV BOLUS
INTRAVENOUS | Status: AC
  Filled 2023-01-23: qty 20

## 2023-01-23 MED ORDER — ONDANSETRON HCL 4 MG/2ML IJ SOLN
INTRAMUSCULAR | Status: DC | PRN
  Administered 2023-01-23: 4 mg via INTRAVENOUS

## 2023-01-23 MED ORDER — ORAL CARE MOUTH RINSE: 15.0000 mL | Freq: Once | OROMUCOSAL | Status: AC

## 2023-01-23 MED ORDER — ONDANSETRON HCL 4 MG/2ML IJ SOLN
INTRAMUSCULAR | Status: AC
Start: 2023-01-23 — End: ?
  Filled 2023-01-23: qty 2

## 2023-01-23 MED ORDER — LIDOCAINE HCL URETHRAL/MUCOSAL 2 % EX GEL
CUTANEOUS | Status: AC
  Filled 2023-01-23: qty 30

## 2023-01-23 MED ORDER — LACTATED RINGERS IV SOLN: INTRAVENOUS | Status: DC | PRN

## 2023-01-23 SURGICAL SUPPLY — 37 items
BAG COUNTER SPONGE SURGICOUNT (BAG) IMPLANT
BAG DRN RND TRDRP ANRFLXCHMBR (UROLOGICAL SUPPLIES) ×2
BAG SPNG CNTER NS LX DISP (BAG)
BAG URINE DRAIN 2000ML AR STRL (UROLOGICAL SUPPLIES) ×3 IMPLANT
BAG URINE LEG 500ML (DRAIN) ×3 IMPLANT
BAG URO CATCHER STRL LF (MISCELLANEOUS) ×3 IMPLANT
BLADE SURG 15 STRL LF DISP TIS (BLADE) ×3 IMPLANT
BLADE SURG 15 STRL SS (BLADE) ×2
CATH FOLEY 2WAY SLVR 18FR 30CC (CATHETERS) IMPLANT
CATH FOLEY 2WAY SLVR 5CC 16FR (CATHETERS) IMPLANT
CLOTH BEACON ORANGE TIMEOUT ST (SAFETY) ×3 IMPLANT
COUNTER NEEDLE 1200 MAGNETIC (NEEDLE) ×3 IMPLANT
COVER SURGICAL LIGHT HANDLE (MISCELLANEOUS) ×3 IMPLANT
ELECT REM PT RETURN 15FT ADLT (MISCELLANEOUS) ×3 IMPLANT
FIBER LASER FLEXIVA 550 (UROLOGICAL SUPPLIES) IMPLANT
GAUZE 4X4 16PLY ~~LOC~~+RFID DBL (SPONGE) ×3 IMPLANT
GLOVE SURG LX STRL 7.5 STRW (GLOVE) ×3 IMPLANT
GOWN STRL REUS W/ TWL XL LVL3 (GOWN DISPOSABLE) ×6 IMPLANT
GOWN STRL REUS W/TWL XL LVL3 (GOWN DISPOSABLE) ×4
KIT TURNOVER KIT A (KITS) IMPLANT
MANIFOLD NEPTUNE II (INSTRUMENTS) ×3 IMPLANT
NDL HYPO 22X1.5 SAFETY MO (MISCELLANEOUS) IMPLANT
NEEDLE HYPO 22X1.5 SAFETY MO (MISCELLANEOUS) IMPLANT
NS IRRIG 1000ML POUR BTL (IV SOLUTION) ×3 IMPLANT
PACK CYSTO (CUSTOM PROCEDURE TRAY) ×3 IMPLANT
PAD PREP 24X48 CUFFED NSTRL (MISCELLANEOUS) ×3 IMPLANT
PENCIL SMOKE EVACUATOR (MISCELLANEOUS) IMPLANT
PLUG CATH AND CAP STRL 200 (CATHETERS) IMPLANT
SUT SILK 2 0 SH (SUTURE) ×3 IMPLANT
SYR 10ML LL (SYRINGE) ×3 IMPLANT
SYR 20ML LL LF (SYRINGE) ×3 IMPLANT
SYR TOOMEY IRRIG 70ML (MISCELLANEOUS)
SYRINGE TOOMEY IRRIG 70ML (MISCELLANEOUS) IMPLANT
TOWEL OR 17X26 10 PK STRL BLUE (TOWEL DISPOSABLE) ×3 IMPLANT
TUBING CONNECTING 10 (TUBING) IMPLANT
TUBING UROLOGY SET (TUBING) ×3 IMPLANT
WATER STERILE IRR 3000ML UROMA (IV SOLUTION) ×3 IMPLANT

## 2023-01-23 NOTE — Op Note (Signed)
Preoperative diagnosis: Neurogenic bladder, bladder stones, indwelling suprapubic tube Post diagnosis: Same  Procedure: Cystoscopy with holmium laser cystolitholopaxy and suprapubic tube exchange  Surgeon: Mena Goes  Anesthesia: MAC  Indication for procedure clear is a 89 year old female with a history of neurogenic bladder who manages with a suprapubic tube.  She had some stones that caused some obstruction which was distressing to her and her family.  She is brought today for suprapubic tube upsize and laser cystolitholopaxy.  Findings: On exam she had severe vaginal atrophy and a narrowed introital meatus but on cystoscopy and vaginoscopy the meatus and the vagina appeared normal.  There was no lesions or masses.  On cystoscopy the urethra and the bladder had no mucosal lesions.  She had severe bladder trabeculation and diverticula.  Again no papillary lesions.  Many layering stones-the largest 2 were fragmented with the holmium laser.  16 French SP tube was removed she was prepped and then I placed an 80 French SP tube without difficulty under visual guidance.  Heart rate went up into the 170s for a brief time but she remained stable.  She has a history of paroxysmal A-fib.  She was given beta-blocker and it returned back to 105.  Description of procedure: After consent was obtained patient brought to the operating room.  After adequate anesthesia she is placed lithotomy position the suprapubic tube was removed.  The suprapubic tube site and the external genitalia and vagina were prepped with Betadine and she was draped in the usual sterile fashion.  Timeout was performed confirm the patient and procedure.  She went up into the 170s when she was put up in the stirrups, she was given beta-blocker and came back down into the 105 range.  That is where she had been.  Blood pressure remained stable.  She remained responsive under MAC.  Lidocaine jelly was instilled per urethra the cystoscope was  passed per urethra and the bladder inspected.  Under direct visualization a new 18 French suprapubic tube catheter was advanced the balloon inflated and then that was seated at the bladder wall.  600 m laser fiber was passed and the largest 2 stones were dusted and all the fragments and smaller stones were evacuated.  No other stones were noted.  The scope was backed out and a quick view of the meatus and vaginal area looked normal.  In addition on exam under anesthesia the bladder and the urethra were palpably normal.  She was then awakened taken recovery room in stable condition.  Complications: None  Blood loss: None  Specimens: None  Drains: 18 French suprapubic tube  Disposition: Patient stable to PACU--I discussed the procedure, postop care and follow-up with Susie.

## 2023-01-23 NOTE — Discharge Instructions (Addendum)
Indwelling Urinary Suprapubic catheter Care, Adult  An indwelling urinary catheter is a thin tube that is put into your bladder. The tube helps to drain pee (urine) out of your body. The tube goes in through your lower abdomen. Your pee will come out through the catheter, then it will go into a bag (drainage bag). Take good care of your catheter so it will work well. What are the risks? Germs may get into your bladder and cause an infection. The tube can become blocked. Tissue near the catheter may become irritated and may bleed. How to wear your catheter and drainage bag Supplies needed Sticky tape (adhesive tape) or a leg strap. Alcohol wipe or soap and water (if you use tape). A clean towel (if you use tape). Large overnight bag. Smaller bag (leg bag). Wearing your catheter Attach your catheter to your leg with tape or a leg strap. Make sure the catheter is not pulled tight. If a leg strap gets wet, take it off and put on a dry strap. If you use tape to hold the bag on your leg: Use an alcohol wipe or soap and water to wash your skin where the tape made it sticky before. Use a clean towel to pat-dry that skin. Use new tape to make the bag stay on your leg. Wearing your bags You should have been given a large overnight bag. You may wear the overnight bag in the day or night. Always have the overnight bag lower than your bladder.  Do not let the bag touch the floor. Before you go to sleep, put a clean plastic bag in a wastebasket. Then, hang the overnight bag inside the wastebasket. You should also have a smaller leg bag that fits under your clothes. Wear the leg bag as told by the product maker. This may be above or below the knee, depending on the length of the tubing. Make sure that the leg bag is below the bladder. Make sure that the tubing does not have loops or too much tension. Do not wear your leg bag at night. How to care for your skin and catheter Supplies needed A clean  washcloth. Water and mild soap. A clean towel. Caring for your skin and catheter   Clean the skin around your catheter every day. Wash your hands with soap and water. Wet a clean washcloth in warm water and mild soap. Clean the skin around your catheter With your free hand, hold the catheter close to where it goes into your body. Keep holding the catheter during cleaning so it does not get pulled out. With the washcloth in your other hand, clean the catheter. Only wipe downward on the catheter, toward the drainage bag. Do not wipe upward toward your body. Doing this may push germs into your urethra and cause infection. Use a clean towel to pat-dry the catheter and the skin around it. Make sure to wipe off all soap. Wash your hands with soap and water. Shower every day.  Do not use cream, ointment, or lotion on the area where the catheter goes into your body, unless your doctor tells you to. Do not use powders, sprays, or lotions on your genital area. Check your skin around the catheter every day for signs of infection. Check for: Redness, swelling, or pain. Fluid or blood. Warmth. Pus or a bad smell. How to empty the bag Supplies needed Rubbing alcohol. Gauze pad or cotton ball. Tape or a leg strap. Emptying the bag Pour the pee  out of your bag when it is ?- full, or at least 2-3 times a day. Do this for your overnight bag and your leg bag. Wash your hands with soap and water. Separate (detach) the bag from your leg. Hold the bag over the toilet or a clean pail. Keep the bag lower than your hips and bladder. This is so the pee (urine) does not go back into the tube. Open the pour spout. It is at the bottom of the bag. Empty the pee into the toilet or pail. Do not let the pour spout touch any surface. Put rubbing alcohol on a gauze pad or cotton ball. Use the gauze pad or cotton ball to clean the pour spout. Close the pour spout. Attach the bag to your leg with tape or a leg  strap. Wash your hands with soap and water. Follow instructions for cleaning the drainage bag. Instructions can come from: The product maker. Your doctor. How to change the bag Changing the bag Replace your bag when it starts to leak, smell bad, or look dirty. Wash your hands with soap and water. Separate the dirty bag from your leg. Pinch the catheter with your fingers so that pee does not spill out. Separate the catheter tube from the bag tube where these tubes connect (at the connection valve). Do not let the tubes touch any surface. Clean the end of the catheter tube with an alcohol wipe. Use a different alcohol wipe to clean the end of the bag tube. Connect the catheter tube to the tube of the clean bag. Attach the clean bag to your leg with tape or a leg strap. Do not make the bag tight on your leg. Wash your hands with soap and water. General instructions  Never pull on your catheter. Never try to take it out. Doing that can hurt you. Always wash your hands before and after you touch your catheter or bag. Use a mild, fragrance-free soap. If you do not have soap and water, use hand sanitizer. Always make sure there are no twists, bends, or kinks in the catheter tube. Always make sure there are no leaks in the catheter or bag. Drink enough fluid to keep your pee pale yellow. Do not take baths, swim, or use a hot tub. If you are female, wipe from front to back after you poop (have a bowel movement). Contact a doctor if: Your catheter gets clogged. Your catheter leaks. You have signs of infection at the catheter site, such as: Redness, swelling, or pain where the catheter goes into your body. Fluid, blood, pus, or a bad smell coming from the area where the catheter goes into your body. Skin feels warm where the catheter goes into your body. You have signs of a bladder infection, such as: Fever. Chills. Pee smells worse than usual. Cloudy pee. Pain in your belly, legs, lower  back, or bladder. Vomiting or feel like vomiting. Get help right away if: You see blood in the catheter. Your pee is pink or red. Your bladder feels full. Your pee is not draining into the bag. Your catheter gets pulled out. Summary An indwelling urinary catheter is a thin tube that is placed into the bladder to help drain pee (urine) out of the body. The catheter is placed into the part of the body that drains pee from the bladder (urethra). Taking good care of your catheter will keep it working well. Always wash your hands before and after touching your catheter or bag.  Never pull on your catheter or try to take it out. This information is not intended to replace advice given to you by your health care provider. Make sure you discuss any questions you have with your health care provider. Document Revised: 11/04/2020 Document Reviewed: 11/04/2020 Elsevier Patient Education  2023 [employer].

## 2023-01-23 NOTE — H&P (Signed)
H&P  Chief Complaint: Hypotonic bladder, suprapubic tube and bladder stones  History of Present Illness: Mary [name] is a 89 year old female with a history of hypotonic bladder and suprapubic tube in 2017 with Dr. Patsi Sears.  She has had some issue with stone formation.  We tried Renacidin and water irrigations over the years.  She was taken June 2023 for bilateral retrograde pyelogram and laser cystolitholopaxy.  Bladder stones are becoming more of an issue as she is getting older and more frail.  She had a recent episode where the suprapubic tube failed to drain and her bladder distended.  She had a lot of pain.  She voided per urethra.  The catheter was irrigated and exchanged and has been draining normally.  She underwent a CT scan of the pelvis September 2024 which revealed layering bladder calculi up to 14 mm.  October 2024 urine culture grew Enterobacter.  She presents today for cystoscopy with holmium laser cystolitholopaxy and suprapubic tube change.  Will upsize her tube to 43 [place].  Otherwise she has been well.  She has had no fever or bladder pain.  Past Medical History:  Diagnosis Date   Alopecia    Ashkenazi Jewish ancestry    Cancer [facility])    Dyspnea    External hemorrhoid    Foley catheter in place    Heart palpitations no cardiologist--  monitored by pcp   per pt "has been on verapamil and lanoxin for years and no palpitations for a very long time"   History of acute bronchitis    dx 12-21-2014  finished [facility]   History of adenomatous polyp of colon    2003   History of basal cell carcinoma excision    2006  left nasolabial fold   History of benign colon tumor    2007  --  HIGH GRADE HYPERPLASTIC ,  S/P  LEFT HEMICOLECTOMY   History of breast cancer ONCOLOGIST-  DR Darnelle Catalan--  antiestogen therapy with femera completed 12/  2012--  no recurrence   dx 10/  2007  right breast Invasive DCIS, grade 2, Stage 2A, pT2  pN0 pMX,  (ER 100% /PR 3% +),  HER +3) with 1 metastatic axill  node---  s/p  right mastectomy    History of kidney stones    Hypertension    Hypotonic bladder UROLOGIST-  DR Patsi Sears   Mixed hyperlipidemia    OA (osteoarthritis)    hip   Osteoporosis    DEXA 01/2009; T-3.2 L fem neck, -2.4 L radius   Urinary retention    02-13-2015   Past Surgical History:  Procedure Laterality Date   APPENDECTOMY  age 78   BREAST LUMPECTOMY WITH NEEDLE LOCALIZATION AND AXILLARY SENTINEL LYMPH NODE BX Right 01-23-2006   CARPAL TUNNEL RELEASE Right 04-24-2007   and Pulley Release index and small finger   CATARACT EXTRACTION W/ INTRAOCULAR LENS  IMPLANT, BILATERAL  left 1991  &  right 1993   COLONOSCOPY  last one 2013   CYSTOSCOPY N/A 07/12/2015   Procedure: CYSTOSCOPY;  Surgeon: Jethro Bolus, MD;  Location: [facility];  Service: Urology;  Laterality: N/A;   CYSTOSCOPY WITH BIOPSY N/A 03/01/2015   Procedure: CYSTOSCOPY WITH TAUBER BIOPSY OF 1CM RIGHT LATERAL BLADDER WALL AND CAUTERIZATION OF BIOPSY SITE ;  Surgeon: Jethro Bolus, MD;  Location: [facility];  Service: Urology;  Laterality: N/A;   CYSTOSCOPY WITH LITHOLAPAXY N/A 09/06/2021   Procedure: CYSTOSCOPY WITH LITHOLAPAXY < 2cm, HOLMIUM LASER LITHOTRIPSY, BILATERAL RETROGRADE PYELOGRAM,  SUPRAPUBIC CATHETER EXCHANGE;  Surgeon: Jerilee Field, MD;  Location: [facility];  Service: Urology;  Laterality: N/A;  ONLY NEEDS 90 MIN   INSERTION OF SUPRAPUBIC CATHETER N/A 07/12/2015   Procedure: INSERTION OF SUPRAPUBIC CATHETER;  Surgeon: Jethro Bolus, MD;  Location: [facility] [place];  Service: Urology;  Laterality: N/A;   LAPAROSCOPIC ASSISTED LEFT HEMICOLECTOMY  06/  2007   high grade hyperplastic mass   MASTECTOMY Right 02-28-2006   PARTIAL KNEE ARTHROPLASTY Right 01/24/2013   Procedure: RIGHT KNEE MEDIAL UNICOMPARTMENTAL ARTHROPLASTY;  Surgeon: Loanne Drilling, MD;  Location: [facility];  Service: Orthopedics;  Laterality: Right;   PULLEY RELEASE LEFT INDEX AND  SMALL FINGER  08-07-2007   TONSILLECTOMY  age 34    Home Medications:  Medications Prior to Admission  Medication Sig Dispense Refill Last Dose   acetaminophen (TYLENOL) 500 MG tablet Take 1,000 mg by mouth 2 (two) times daily at 8 am and 10 pm.      atorvastatin (LIPITOR) 40 MG tablet TAKE 1 TABLET(40 MG) BY MOUTH DAILY (Patient taking differently: Take 40 mg by mouth every evening.) 90 tablet 3    bisacodyl (DULCOLAX) 5 MG EC tablet Take 5 mg by mouth daily as needed for moderate constipation.      Cholecalciferol (VITAMIN D3) 50 MCG (2000 UT) TABS Take 2,000 Units by mouth daily. 100 tablet 4    denosumab (PROLIA) 60 MG/ML SOLN injection Inject 60 mg into the skin every 6 (six) months. Administer in upper arm, thigh, or abdomen      diazepam (VALIUM) 2 MG tablet TAKE 1 TABLET(2 MG) BY MOUTH EVERY 8 HOURS AS NEEDED FOR MUSCLE SPASMS 30 tablet 0    fluticasone (CUTIVATE) 0.005 % ointment Apply 1 Application topically daily as needed (irritation).      furosemide (LASIX) 20 MG tablet Take 0.5 tablets (10 mg total) by mouth every other day. 50 tablet 3    gabapentin (NEURONTIN) 100 MG capsule Take 1 capsule by mouth 30 minutes prior to bedtime.  You may increase to 2 capsules after 1 week, if needed 60 capsule 0    loratadine (CLARITIN) 10 MG tablet Take 10 mg by mouth daily as needed for allergies.      losartan (COZAAR) 25 MG tablet Take 0.5 tablets (12.5 mg total) by mouth daily. 15 tablet 3    metoprolol succinate (TOPROL-XL) 25 MG 24 hr tablet Take 1 tablet (25 mg total) by mouth daily. Take with or immediately following a meal. 30 tablet 3    Multiple Vitamins-Minerals (PRESERVISION AREDS 2) CAPS Take 1 capsule by mouth 2 (two) times daily.      Polyvinyl Alcohol-Povidone (REFRESH OP) Place 1 drop into both eyes 2 (two) times daily.      spironolactone (ALDACTONE) 25 MG tablet Take 0.5 tablets (12.5 mg total) by mouth daily. 15 tablet 3    triamcinolone cream (KENALOG) 0.1 % APPLY TOPICALLY  TO THE AFFECTED AREA TWICE DAILY (Patient taking differently: Apply 1 Application topically daily as needed (irritation).) 30 g 0    potassium chloride (MICRO-K) 10 MEQ CR capsule Take 1 capsule (10 mEq total) by mouth 2 (two) times a week. (Patient not taking: Reported on 01/10/2023) 30 capsule 2 Not Taking   Allergies:  Allergies  Allergen Reactions   Arimidex [Anastrozole] Rash   Avelox [Moxifloxacin Hcl In Nacl] Nausea Only and Rash   Azithromycin Nausea Only and Rash   Bactrim Nausea Only and Rash   Biaxin [Clarithromycin] Nausea Only  and Rash   Nitrofurantoin Nausea Only, Rash and Other (See Comments)   Penicillins Nausea Only and Rash   Sulfa Antibiotics Nausea And Vomiting and Rash   Sulfasalazine Rash and Other (See Comments)    GI intolerance    Sulfur Nausea And Vomiting and Rash    Family History  Problem Relation Age of Onset   Hypertension Mother    Stroke Father    Diabetes Father    Hyperlipidemia Brother    Atrial fibrillation Brother    Hyperlipidemia Son    Breast cancer Paternal Aunt        dx 81s; deceased early 39s   Breast cancer Cousin        paternal first cousin   Colon cancer Cousin        paternal first cousin; age dx 49s   Social History:  reports that she has never smoked. She has never used smokeless tobacco. She reports that she does not drink alcohol and does not use drugs.  ROS: A complete review of systems was performed.  All systems are negative except for pertinent findings as noted. Review of Systems  All other systems reviewed and are negative.    Physical Exam:  Vital signs in last 24 hours: Temp:  [98.6 F (37 C)] 98.6 F (37 C) (11/05 0716) Pulse Rate:  [84] 84 (11/05 0716) Resp:  [16] 16 (11/05 0716) BP: (142)/(95) 142/95 (11/05 0716) SpO2:  [95 %] 95 % (11/05 0716) General:  Alert and oriented, No acute distress HEENT: Normocephalic, atraumatic Cardiovascular: Regular rate and rhythm Lungs: Regular rate and  effort Abdomen: Soft, nontender, nondistended, no abdominal masses, 16 French SP tube in place, clear urine Back: No CVA tenderness Extremities: No edema Neurologic: Grossly intact  Laboratory Data:  No results found for this or any previous visit (from the past 24 hour(s)). No results found for this or any previous visit (from the past 240 hour(s)). Creatinine: No results for input(s): "CREATININE" in the last 168 hours.  Impression/Assessment:  Hypotonic bladder, suprapubic tube, bladder stones-  Plan:  I discussed with the patient the nature, potential benefits, risks and alternatives to cystoscopy with suprapubic tube change and laser cystolitholopaxy, including side effects of the proposed treatment, the likelihood of the patient achieving the goals of the procedure, and any potential problems that might occur during the procedure or recuperation.  Will upsize her SP tube to 18 [place].  All questions answered. Patient elects to proceed.    Jerilee Field 01/23/2023

## 2023-01-23 NOTE — Transfer of Care (Signed)
Immediate Anesthesia Transfer of Care Note  Patient: Mary [name]  Procedure(s) Performed: CYSTOSCOPY WITH LITHOLAPAXY (Bladder) SUPRAPUBIC CATHETER EXCHANGE (Abdomen)  Patient Location: PACU  Anesthesia Type:MAC  Level of Consciousness: awake, alert , and oriented  Airway & Oxygen Therapy: Patient Spontanous Breathing  Post-op Assessment: Report given to RN  Post vital signs: Reviewed and stable  Last Vitals:  Vitals Value Taken Time  BP 139/108 01/23/23 1009  Temp    Pulse 134 01/23/23 1011  Resp 32 01/23/23 1011  SpO2 91 % 01/23/23 1011  Vitals shown include unfiled device data.  Last Pain:  Vitals:   01/23/23 0716  TempSrc: Oral         Complications: No notable events documented.

## 2023-01-23 NOTE — Anesthesia Preprocedure Evaluation (Addendum)
Anesthesia Evaluation  Patient identified by MRN, date of birth, ID band Patient awake    Reviewed: Allergy & Precautions, NPO status , Patient's Chart, lab work & pertinent test results  Airway Mallampati: Unable to assess       Dental no notable dental hx.    Pulmonary    Pulmonary exam normal        Cardiovascular hypertension, +CHF   Rhythm:Regular Rate:Normal     Neuro/Psych  Neuromuscular disease  negative psych ROS   GI/Hepatic negative GI ROS, Neg liver ROS,,,  Endo/Other  negative endocrine ROS    Renal/GU negative Renal ROS     Musculoskeletal  (+) Arthritis ,    Abdominal   Peds  Hematology negative hematology ROS (+)   Anesthesia Other Findings   Reproductive/Obstetrics                             Anesthesia Physical Anesthesia Plan  ASA: 3  Anesthesia Plan: MAC   Post-op Pain Management: Tylenol PO (pre-op)*   Induction: Intravenous  PONV Risk Score and Plan: 3 and Ondansetron and Treatment may vary due to age or medical condition  Airway Management Planned: Natural Airway and Simple Face Mask  Additional Equipment: None  Intra-op Plan:   Post-operative Plan:   Informed Consent: I have reviewed the patients History and Physical, chart, labs and discussed the procedure including the risks, benefits and alternatives for the proposed anesthesia with the patient or authorized representative who has indicated his/her understanding and acceptance.       Plan Discussed with: CRNA  Anesthesia Plan Comments:        Anesthesia Quick Evaluation

## 2023-01-23 NOTE — Anesthesia Postprocedure Evaluation (Signed)
Anesthesia Post Note  Patient: Mary [name]  Procedure(s) Performed: CYSTOSCOPY WITH LITHOLAPAXY (Bladder) SUPRAPUBIC CATHETER EXCHANGE (Abdomen)     Patient location during evaluation: PACU Anesthesia Type: MAC Level of consciousness: awake and alert Pain management: pain level controlled Vital Signs Assessment: post-procedure vital signs reviewed and stable Respiratory status: spontaneous breathing, nonlabored ventilation, respiratory function stable and patient connected to nasal cannula oxygen Cardiovascular status: stable and blood pressure returned to baseline Postop Assessment: no apparent nausea or vomiting Anesthetic complications: no Comments: HR fluctuates from 140's to 40's. Will not administer medications at this time. Daughter aware and will follow up with PCP.  No notable events documented.  Last Vitals:  Vitals:   01/23/23 1100 01/23/23 1115  BP: (!) 142/82 138/88  Pulse: 86 (!) 49  Resp:    Temp:    SpO2: 92% 90%    Last Pain:  Vitals:   01/23/23 1100  TempSrc:   PainSc: 0-No pain                 Shelton Silvas

## 2023-01-23 NOTE — Interval H&P Note (Signed)
History and Physical Interval Note:  01/23/2023 9:13 AM  I discussed Annalycia's situation, diagnosis and rationale for the procedure, nature, potential benefits, risks and alternatives, side effects of the proposed treatment, the likelihood of the patient achieving the goals of the procedure, and any potential problems that might occur during the procedure or recuperation with Susie. All questions answered.   Jerilee Field

## 2023-01-24 ENCOUNTER — Encounter (HOSPITAL_COMMUNITY): Payer: Self-pay | Admitting: Urology

## 2023-01-25 ENCOUNTER — Ambulatory Visit: Payer: Self-pay

## 2023-01-25 NOTE — Patient Instructions (Addendum)
Visit Information  Thank you for taking time to visit with me today. Please don't hesitate to contact me if I can be of assistance to you.   Following are the goals we discussed today:   Goals Addressed             This Visit's Progress    To undergo cystoscopy and suprapubic catheter placement without complications   On track    Care Coordination Interventions: Completed outbound call with patient's daughters Lynnell Dike and Rosann Auerbach Evaluation of current treatment plan related to urinary retention and patient's adherence to plan as established by provider Determined patient completed a cystoscopy with suprapubic catheter placement without complications Assessed for knowledge and understanding of patient's post procedure care Determined patient has 24/7 private aid care in her home, she has a private paid skilled nurse visit who flushes her catheter weekly Determined patient's aid/nurse will contact patient's daughters if new symptoms or concerns arise Discussed patient's daughters will visit her and attend some doctor's appointments with her Determined patient is feeling well and voices no complaints at this time  Reviewed and discussed with daughters patient's upcoming scheduled appointments Determined patient will f/u with her Urologist for next catheter change on 02/22/23 @3 :30 PM for which one of her daughter's will accompany her Educated daughters [employer] for primary care for future reference if patient becomes more homebound, provided company website and answered questions Discussed plans with patient for ongoing care coordination follow up and provided patient with direct contact information for nurse care coordinator        Our next appointment is by telephone on 02/27/23 at 1:30 PM  Please call the care guide team at [phone] if you need to cancel or reschedule your appointment.   If you are experiencing a Mental Health or Behavioral Health Crisis or need someone to talk  to, please call 1-800-273-TALK (toll free, 24 hour hotline)  Patient verbalizes understanding of instructions and care plan provided today and agrees to view in MyChart. Active MyChart status and patient understanding of how to access instructions and care plan via MyChart confirmed with patient.     Delsa Sale RN BSN CCM [place]  [facility], [facility] Nurse Care Coordinator  Direct Dial: [phone] Website: [email or web address]

## 2023-01-25 NOTE — Patient Outreach (Signed)
  Care Coordination   Follow Up Visit Note   01/25/2023 Name: Mary [name] MRN: [id number] DOB: 26-Aug-1926  Mary [name] is a 89 y.o. year old female who sees Mary Arrow, MD for primary care. I spoke with daughters Mary [name] and Mary [name] by phone today.  What matters to the patients health and wellness today?  Patient would like to continue to live in her home with hired help and stay out of the hospital.     Goals Addressed             This Visit's Progress    To undergo cystoscopy and suprapubic catheter placement without complications   On track    Care Coordination Interventions: Completed outbound call with patient's daughters Mary [name] and Mary [name] Evaluation of current treatment plan related to urinary retention and patient's adherence to plan as established by provider Determined patient completed a cystoscopy with suprapubic catheter placement without complications Assessed for knowledge and understanding of patient's post procedure care Determined patient has 24/7 private aid care in her home, she has a private paid skilled nurse visit who flushes her catheter weekly Determined patient's aid/nurse will contact patient's daughters if new symptoms or concerns arise Discussed patient's daughters will visit her and attend some doctor's appointments with her Determined patient is feeling well and voices no complaints at this time  Reviewed and discussed with daughters patient's upcoming scheduled appointments Determined patient will f/u with her Urologist for next catheter change on 02/22/23 @3 :30 PM for which one of her daughter's will accompany her Educated daughters [employer] for primary care for future reference if patient becomes more homebound, provided company website and answered questions Discussed plans with patient for ongoing care coordination follow up and provided patient with direct contact information for nurse care coordinator    Interventions Today    Flowsheet Row Most  Recent Value  Chronic Disease   Chronic disease during today's visit Other  [cystoscopy and suprapubic catheter placement]  General Interventions   General Interventions Discussed/Reviewed General Interventions Reviewed, General Interventions Discussed, Doctor Visits, Level of Care  Doctor Visits Discussed/Reviewed PCP, Doctor Visits Reviewed, Doctor Visits Discussed, Specialist  Education Interventions   Education Provided Provided Education  Provided Verbal Education On When to see the doctor, Medication, Other  [suprapubic catheter care]  Nutrition Interventions   Nutrition Discussed/Reviewed Nutrition Reviewed, Nutrition Discussed  Pharmacy Interventions   Pharmacy Dicussed/Reviewed Pharmacy Topics Reviewed, Pharmacy Topics Discussed, Medications and their functions  Safety Interventions   Safety Discussed/Reviewed Home Safety          SDOH assessments and interventions completed:  Yes     Care Coordination Interventions:  Yes, provided   Follow up plan: Follow up call scheduled for 02/27/23 @1 :30 PM     Encounter Outcome:  Patient Visit Completed

## 2023-01-30 ENCOUNTER — Ambulatory Visit: Payer: Self-pay | Admitting: Licensed Clinical Social Worker

## 2023-02-01 ENCOUNTER — Ambulatory Visit (INDEPENDENT_AMBULATORY_CARE_PROVIDER_SITE_OTHER): Payer: Medicare Other | Admitting: Family Medicine

## 2023-02-01 ENCOUNTER — Encounter: Payer: Self-pay | Admitting: Family Medicine

## 2023-02-01 VITALS — BP 140/70 | HR 84 | Temp 98.5°F | Wt 120.0 lb

## 2023-02-01 DIAGNOSIS — J302 Other seasonal allergic rhinitis: Secondary | ICD-10-CM

## 2023-02-01 DIAGNOSIS — R110 Nausea: Secondary | ICD-10-CM | POA: Diagnosis not present

## 2023-02-01 DIAGNOSIS — I5022 Chronic systolic (congestive) heart failure: Secondary | ICD-10-CM

## 2023-02-01 DIAGNOSIS — N210 Calculus in bladder: Secondary | ICD-10-CM

## 2023-02-01 DIAGNOSIS — G4700 Insomnia, unspecified: Secondary | ICD-10-CM

## 2023-02-01 DIAGNOSIS — G2581 Restless legs syndrome: Secondary | ICD-10-CM

## 2023-02-01 DIAGNOSIS — R051 Acute cough: Secondary | ICD-10-CM | POA: Diagnosis not present

## 2023-02-01 MED ORDER — ONDANSETRON 4 MG PO TBDP: 4.0000 mg | ORAL_TABLET | Freq: Three times a day (TID) | ORAL | 0 refills | Status: DC | PRN

## 2023-02-01 NOTE — Progress Notes (Signed)
Chief Complaint  Patient presents with   Nausea    Has been having nausea for a few weeks. Had dry heaves in the middle of the night. Has had a dry cough for about 2 weeks, allergies is what they think it is. She has not been taking anything for allergies.    Patient is accompanied by her niece, Lupita Leash. She is complaining of a dry cough x 2 weeks, and nausea. She has decreased appetite.  She had been taking Robitussin CF for the last 2 weeks for cough (containing APAP, DM, guaifenesin and phenylephrine). She had run out, was off for a while. Restarted it last night, and made her more nauseated. Nonproductive, dry cough.  Dry heaves were really bad last night. They are requesting medication for nausea.  She reports that much of her heaving/nausea was related to coughing last night, but also seemed worse after taking the cough syrup. Lupita Leash (niece) reports having more nausea than just related to cough. She has no appetite, and gets nauseated if she doesn't eat.  Has very slight congestion. Has known allergies, more in the spring. She hasn't been taking her claritin.  Cough triggers her to feel nauseated. No indigestion, belching or heartburn.  She has been sleeping in recliner--using a different bathroom.  The change in routine affected some of her OTC medications, which are kept in her master bathroom.  She found it is too hard to use the walker over the carpet in her bedroom, to get to that bathroom.  She had urologic procedure 9 days ago (cystoscopy with litholapaxy). Cough started prior to this, gradually getting worse.  Lithostat from urologist for bladder stones.--hasn't taken it yesterday or today. No change in nausea. (Common SE for this med is HA, N/V)  She is under care of cardiologist. Has atrial tachycardia, HFmRF.  She continues to have DOE with any activity at home She is mainly in the recliner at home  Last visit here (virtual) was to discuss some restless legs. We tried  gabapentin, but this caused bad nightmares so she stopped it. They report she had shooting pains in legs when in bed, doesn't occur in the recliner. She reports that her jumpy legs isn't  as bad as it had been. Lupita Leash reports she doesn't sleep much at night, but sleeps most of the day, spent in the recliner.   PMH, PSH, SH reviewed  Outpatient Encounter Medications as of 02/01/2023  Medication Sig Note   acetaminophen (TYLENOL) 500 MG tablet Take 1,000 mg by mouth 2 (two) times daily at 8 am and 10 pm. 02/01/2023: Did not take last night or this am   atorvastatin (LIPITOR) 40 MG tablet TAKE 1 TABLET(40 MG) BY MOUTH DAILY (Patient taking differently: Take 40 mg by mouth every evening.)    bisacodyl (DULCOLAX) 5 MG EC tablet Take 5 mg by mouth daily as needed for moderate constipation. 02/01/2023: Takes one every night   Cholecalciferol (VITAMIN D3) 50 MCG (2000 UT) TABS Take 2,000 Units by mouth daily.    denosumab (PROLIA) 60 MG/ML SOLN injection Inject 60 mg into the skin every 6 (six) months. Administer in upper arm, thigh, or abdomen    furosemide (LASIX) 20 MG tablet Take 0.5 tablets (10 mg total) by mouth every other day. (Patient taking differently: Take 10 mg by mouth 3 (three) times a week.)    LITHOSTAT 250 MG TABS Take 1 tablet by mouth daily. 02/01/2023: Cutting in half and taking due to size   losartan (  COZAAR) 25 MG tablet Take 0.5 tablets (12.5 mg total) by mouth daily.    metoprolol succinate (TOPROL-XL) 25 MG 24 hr tablet Take 1 tablet (25 mg total) by mouth daily. Take with or immediately following a meal.    Multiple Vitamins-Minerals (PRESERVISION AREDS 2) CAPS Take 1 capsule by mouth 2 (two) times daily. 02/01/2023: Once daily   Polyvinyl Alcohol-Povidone (REFRESH OP) Place 1 drop into both eyes 2 (two) times daily.    pseudoephedrine-dextromethorphan-guaifenesin (ROBITUSSIN-PE) 30-10-100 MG/5ML solution Take 10 mLs by mouth 4 (four) times daily as needed for cough.  02/01/2023: Took last night   spironolactone (ALDACTONE) 25 MG tablet Take 0.5 tablets (12.5 mg total) by mouth daily.    diazepam (VALIUM) 2 MG tablet TAKE 1 TABLET(2 MG) BY MOUTH EVERY 8 HOURS AS NEEDED FOR MUSCLE SPASMS (Patient not taking: Reported on 02/01/2023) 02/01/2023: As needed   fluticasone (CUTIVATE) 0.005 % ointment Apply 1 Application topically daily as needed (irritation). (Patient not taking: Reported on 02/01/2023) 02/01/2023: As needed   gabapentin (NEURONTIN) 100 MG capsule Take 1 capsule by mouth 30 minutes prior to bedtime.  You may increase to 2 capsules after 1 week, if needed (Patient not taking: Reported on 02/01/2023) 02/01/2023: Stopped due to nightmares   loratadine (CLARITIN) 10 MG tablet Take 10 mg by mouth daily as needed for allergies. (Patient not taking: Reported on 02/01/2023) 02/01/2023: Has not taken in 2-3 weeks   [DISCONTINUED] ACETOHYDROXAMIC ACID PO Take by mouth.    [DISCONTINUED] potassium chloride (MICRO-K) 10 MEQ CR capsule Take 1 capsule (10 mEq total) by mouth 2 (two) times a week. (Patient not taking: Reported on 01/10/2023)    [DISCONTINUED] triamcinolone cream (KENALOG) 0.1 % APPLY TOPICALLY TO THE AFFECTED AREA TWICE DAILY (Patient not taking: Reported on 02/01/2023) 02/01/2023: As needed   No facility-administered encounter medications on file as of 02/01/2023.   Allergies  Allergen Reactions   Arimidex [Anastrozole] Rash   Avelox [Moxifloxacin Hcl In Nacl] Nausea Only and Rash   Azithromycin Nausea Only and Rash   Bactrim Nausea Only and Rash   Biaxin [Clarithromycin] Nausea Only and Rash   Nitrofurantoin Nausea Only, Rash and Other (See Comments)   Penicillins Nausea Only and Rash   Sulfa Antibiotics Nausea And Vomiting and Rash   Sulfasalazine Rash and Other (See Comments)    GI intolerance    Sulfur Nausea And Vomiting and Rash    ROS: denies f/c.  No ear pain. Some mild congestion. Cough per HPI Nausea, dry heaves per HPI. Denies  urinary issues currently. No headaches, dizziness, chest pain.  +DOE, unchanged. Edema mild. Lupita Leash states she hasn't been weighing herself at home.  Declined weight in office today.   PHYSICAL EXAM:  BP (!) 140/70   Pulse 84   Temp 98.5 F (36.9 C) (Tympanic)   Wt 120 lb (54.4 kg) Comment: per patient  LMP  (LMP Unknown)   BMI 21.26 kg/m   Elderly female, in no distress During the visit she had some sniffles, nose-blowing, throat-clearing, and occasional dry cough requiring a sip of water. She is speaking comfortably, in no distress HEENT: conjunctiva and sclera are clear. TM's and EAC's normal. Nasal mucosa with mild-mod edema. +white thick mucus fairly deep in nose. Sinuses nontender. OP is clear, moist mucus membranes Neck: no lymphadenopathy or mass Heart: irregularly irregular Lungs: clear bilaterally Abdomen: soft, nontender Extremities: 1+ edema R slightly more than L Neuro: alert and oriented, cranial nerves grossly intact.  Gait not assessed, in  wheelchair Psych: normal hygiene, grooming, eye contact, speech, normal mood/affect   ASSESSMENT/PLAN:  Nausea - suspect partly due to PND, poss SE of meds also. Discussed use of zofran prn (not for heaving related to cough) - Plan: ondansetron (ZOFRAN ODT) 4 MG disintegrating tablet  Heart failure with mid-range ejection fraction (HFmEF) (HCC) - continue current meds. Encouraged her to check daily weights as directed, and reasons why.  Acute cough - based on exam, suspect related to PND from allergies. Rec guaifenesin and claritin. Ddx reviewed  Bladder stones - under care of urologist, with recent treatment and new medication. Denies any current problems.  Seasonal allergies - restart claritin  Restless leg - less bothersome now, so hold off on any other medications  Insomnia, unspecified type - not sleeping at night, sleeping during the day.  Discussed issues with sleep cycle/rhythm. Sleep meds not  appropriate   Pt wanted to cancel next week's appt since seen today. Explained importance of coming for her wellness visit, that this was for an acute problem. Lupita Leash had mentioned that she thinks she hadn't been bathing, hadn't been weighing herself. Some disputes between what she says, patient reports.  We have mostly been addressing care issues/needs with her daughter, and Lupita Leash is just reporting what she sees being local. Advised we would delve deeper at next week's appointment, and verify they have the care she needs at home.  I spent 48 minutes dedicated to the care of this patient, including pre-visit review of records, face to face time, post-visit ordering of testing and documentation.  Stop taking the Mucinex CF that you have at home (to eliminate the phenylephrine that you shouldn't be taking). The ingredients that you should take are guaifenesin and dextromethorphan (an expectorant and a cough suppressant). Since you can't swallow large tablets, I don't think the Mucinex DM 12 hour will work for you. You will need to find a different one, which likely has directions of taking it every 4-6 hours.  Follow the instructions on the label for whichever one you get.  Restart taking claritin daily (loratidine).  Try using saline spray frequently (the Ayr that you have is fine). You can consider doing a sinus rinse in order to clear the mucus from the nose before it reaches your throat/chest.

## 2023-02-01 NOTE — Patient Instructions (Addendum)
Stop taking the Mucinex CF that you have at home (to eliminate the phenylephrine that you shouldn't be taking). The ingredients that you should take are guaifenesin and dextromethorphan (an expectorant and a cough suppressant). Since you can't swallow large tablets, I don't think the Mucinex DM 12 hour will work for you. You will need to find a different one, which likely has directions of taking it every 4-6 hours.  Follow the instructions on the label for whichever one you get.  Restart taking claritin daily (loratidine).  Try using saline spray frequently (the Ayr that you have is fine). You can consider doing a sinus rinse in order to clear the mucus from the nose before it reaches your throat/chest.  Please be sure to weigh yourself daily at home, and contact the cardiologist's office per their recommendations.

## 2023-02-02 NOTE — Patient Instructions (Signed)
Visit Information  Thank you for taking time to visit with me today. Please don't hesitate to contact me if I can be of assistance to you.   Following are the goals we discussed today:   Goals Addressed             This Visit's Progress    Obtain Supportive Resources   On track    Activities and task to complete in order to accomplish goals.   Keep all upcoming appointments discussed today Continue with compliance of taking medication prescribed by Doctor Implement healthy coping skills discussed to assist with management of symptoms Continue working with [facility] care team to assist with goals identified         Our next appointment is by telephone on 1/14 at 1:30 PM  Please call the care guide team at [phone] if you need to cancel or reschedule your appointment.   If you are experiencing a Mental Health or Behavioral Health Crisis or need someone to talk to, please call the Suicide and Crisis Lifeline: 988 call 911   Patient verbalizes understanding of instructions and care plan provided today and agrees to view in MyChart. Active MyChart status and patient understanding of how to access instructions and care plan via MyChart confirmed with patient.     [name], MSW, LCSW [facility] Care Management [place]  [facility] [email or web address] Phone [phone] 5:58 AM

## 2023-02-02 NOTE — Patient Outreach (Signed)
  Care Coordination   Follow Up Visit Note   01/30/2023 Name: Mary [name] MRN: [id number] DOB: 08-18-1926  Mary [name] is a 89 y.o. year old female who sees Mary Arrow, MD for primary care. I spoke with  Eston Esters adult daughters by phone today.  What matters to the patients health and wellness today?  Symptom Management and Level of Care    Goals Addressed             This Visit's Progress    Obtain Supportive Resources   On track    Activities and task to complete in order to accomplish goals.   Keep all upcoming appointments discussed today Continue with compliance of taking medication prescribed by Doctor Implement healthy coping skills discussed to assist with management of symptoms Continue working with [name] care team to assist with goals identified         SDOH assessments and interventions completed:  No     Care Coordination Interventions:  Yes, provided  Interventions Today    Flowsheet Row Most Recent Value  Chronic Disease   Chronic disease during today's visit Hypertension (HTN), Congestive Heart Failure (CHF)  General Interventions   General Interventions Discussed/Reviewed General Interventions Reviewed, Level of Care, Doctor Visits  [Surgery went well. [name] and her husband accompanied her. Pt had a fall the next day due to the anesthesia, no injuries sustained. Has started 24/7 care with a private aid.]  Doctor Visits Discussed/Reviewed Doctor Visits Reviewed  Level of Care Assisted Living  [Family spoke with [name] during recent visit, weren't ready to accept unit. Pt has been placed back on the waiting list]  Mental Health Interventions   Mental Health Discussed/Reviewed Mental Health Reviewed, Coping Strategies, Anxiety, Depression, Grief and Loss  [Caregiver Strain endorsed. Encouragement/Validation provided. Family continues to establish/maintain boundaries with pt to ensure safety. Pt grieves independence and is having difficulty accepting  need for 24/7 caregiver support. Coping skills discussed]  Safety Interventions   Safety Discussed/Reviewed Safety Reviewed, Fall Risk  Advanced Directive Interventions   Advanced Directives Discussed/Reviewed End of Life  End of Life Palliative, Hospice  [LCSW discussed the difference and benefits to both services]       Follow up plan: Follow up call scheduled for 4-6 weeks    Encounter Outcome:  Patient Visit Completed   [name], MSW, LCSW [facility] Management [facility]  [facility] [email or web address] Phone [phone] 5:58 AM

## 2023-02-03 ENCOUNTER — Inpatient Hospital Stay (HOSPITAL_COMMUNITY)
Admission: EM | Admit: 2023-02-03 | Discharge: 2023-02-08 | DRG: 291 | Disposition: A | Discharge status: Home Health | Payer: Medicare Other | Attending: Internal Medicine | Admitting: Internal Medicine

## 2023-02-03 ENCOUNTER — Emergency Department (HOSPITAL_COMMUNITY): Payer: Medicare Other

## 2023-02-03 ENCOUNTER — Other Ambulatory Visit: Payer: Self-pay

## 2023-02-03 DIAGNOSIS — Z883 Allergy status to other anti-infective agents status: Secondary | ICD-10-CM | POA: Diagnosis not present

## 2023-02-03 DIAGNOSIS — Z881 Allergy status to other antibiotic agents status: Secondary | ICD-10-CM

## 2023-02-03 DIAGNOSIS — E871 Hypo-osmolality and hyponatremia: Secondary | ICD-10-CM | POA: Diagnosis present

## 2023-02-03 DIAGNOSIS — R140 Abdominal distension (gaseous): Secondary | ICD-10-CM | POA: Diagnosis not present

## 2023-02-03 DIAGNOSIS — I110 Hypertensive heart disease with heart failure: Principal | ICD-10-CM | POA: Diagnosis present

## 2023-02-03 DIAGNOSIS — R059 Cough, unspecified: Secondary | ICD-10-CM | POA: Diagnosis not present

## 2023-02-03 DIAGNOSIS — I10 Essential (primary) hypertension: Secondary | ICD-10-CM | POA: Diagnosis not present

## 2023-02-03 DIAGNOSIS — R0602 Shortness of breath: Secondary | ICD-10-CM | POA: Diagnosis not present

## 2023-02-03 DIAGNOSIS — Z888 Allergy status to other drugs, medicaments and biological substances status: Secondary | ICD-10-CM | POA: Diagnosis not present

## 2023-02-03 DIAGNOSIS — M1711 Unilateral primary osteoarthritis, right knee: Secondary | ICD-10-CM | POA: Diagnosis present

## 2023-02-03 DIAGNOSIS — G2581 Restless legs syndrome: Secondary | ICD-10-CM | POA: Diagnosis present

## 2023-02-03 DIAGNOSIS — K5904 Chronic idiopathic constipation: Secondary | ICD-10-CM | POA: Diagnosis not present

## 2023-02-03 DIAGNOSIS — Z833 Family history of diabetes mellitus: Secondary | ICD-10-CM | POA: Diagnosis not present

## 2023-02-03 DIAGNOSIS — M810 Age-related osteoporosis without current pathological fracture: Secondary | ICD-10-CM | POA: Diagnosis present

## 2023-02-03 DIAGNOSIS — Z880 Allergy status to penicillin: Secondary | ICD-10-CM

## 2023-02-03 DIAGNOSIS — I4510 Unspecified right bundle-branch block: Secondary | ICD-10-CM | POA: Diagnosis present

## 2023-02-03 DIAGNOSIS — Z823 Family history of stroke: Secondary | ICD-10-CM

## 2023-02-03 DIAGNOSIS — I5023 Acute on chronic systolic (congestive) heart failure: Secondary | ICD-10-CM | POA: Diagnosis present

## 2023-02-03 DIAGNOSIS — Z803 Family history of malignant neoplasm of breast: Secondary | ICD-10-CM

## 2023-02-03 DIAGNOSIS — Z83438 Family history of other disorder of lipoprotein metabolism and other lipidemia: Secondary | ICD-10-CM

## 2023-02-03 DIAGNOSIS — Z9011 Acquired absence of right breast and nipple: Secondary | ICD-10-CM

## 2023-02-03 DIAGNOSIS — Z79899 Other long term (current) drug therapy: Secondary | ICD-10-CM

## 2023-02-03 DIAGNOSIS — Z96651 Presence of right artificial knee joint: Secondary | ICD-10-CM | POA: Diagnosis present

## 2023-02-03 DIAGNOSIS — Z85828 Personal history of other malignant neoplasm of skin: Secondary | ICD-10-CM | POA: Diagnosis not present

## 2023-02-03 DIAGNOSIS — R000 Tachycardia, unspecified: Secondary | ICD-10-CM | POA: Diagnosis not present

## 2023-02-03 DIAGNOSIS — N858 Other specified noninflammatory disorders of uterus: Secondary | ICD-10-CM | POA: Diagnosis not present

## 2023-02-03 DIAGNOSIS — I5031 Acute diastolic (congestive) heart failure: Secondary | ICD-10-CM | POA: Diagnosis not present

## 2023-02-03 DIAGNOSIS — K5900 Constipation, unspecified: Secondary | ICD-10-CM

## 2023-02-03 DIAGNOSIS — Z66 Do not resuscitate: Secondary | ICD-10-CM | POA: Diagnosis present

## 2023-02-03 DIAGNOSIS — I4719 Other supraventricular tachycardia: Secondary | ICD-10-CM | POA: Diagnosis present

## 2023-02-03 DIAGNOSIS — J9811 Atelectasis: Secondary | ICD-10-CM | POA: Diagnosis present

## 2023-02-03 DIAGNOSIS — R918 Other nonspecific abnormal finding of lung field: Secondary | ICD-10-CM | POA: Diagnosis not present

## 2023-02-03 DIAGNOSIS — Z800 Family history of malignant neoplasm of digestive organs: Secondary | ICD-10-CM

## 2023-02-03 DIAGNOSIS — Z9049 Acquired absence of other specified parts of digestive tract: Secondary | ICD-10-CM

## 2023-02-03 DIAGNOSIS — J189 Pneumonia, unspecified organism: Principal | ICD-10-CM

## 2023-02-03 DIAGNOSIS — R110 Nausea: Secondary | ICD-10-CM | POA: Diagnosis not present

## 2023-02-03 DIAGNOSIS — E782 Mixed hyperlipidemia: Secondary | ICD-10-CM | POA: Diagnosis present

## 2023-02-03 DIAGNOSIS — I5043 Acute on chronic combined systolic (congestive) and diastolic (congestive) heart failure: Secondary | ICD-10-CM | POA: Diagnosis not present

## 2023-02-03 DIAGNOSIS — Z8249 Family history of ischemic heart disease and other diseases of the circulatory system: Secondary | ICD-10-CM | POA: Diagnosis not present

## 2023-02-03 DIAGNOSIS — R112 Nausea with vomiting, unspecified: Secondary | ICD-10-CM | POA: Diagnosis not present

## 2023-02-03 DIAGNOSIS — R0902 Hypoxemia: Secondary | ICD-10-CM | POA: Diagnosis not present

## 2023-02-03 DIAGNOSIS — Z882 Allergy status to sulfonamides status: Secondary | ICD-10-CM | POA: Diagnosis not present

## 2023-02-03 DIAGNOSIS — I491 Atrial premature depolarization: Secondary | ICD-10-CM | POA: Diagnosis present

## 2023-02-03 DIAGNOSIS — I509 Heart failure, unspecified: Secondary | ICD-10-CM

## 2023-02-03 DIAGNOSIS — N281 Cyst of kidney, acquired: Secondary | ICD-10-CM | POA: Diagnosis not present

## 2023-02-03 DIAGNOSIS — I499 Cardiac arrhythmia, unspecified: Secondary | ICD-10-CM | POA: Diagnosis not present

## 2023-02-03 DIAGNOSIS — J90 Pleural effusion, not elsewhere classified: Secondary | ICD-10-CM | POA: Diagnosis not present

## 2023-02-03 DIAGNOSIS — Z853 Personal history of malignant neoplasm of breast: Secondary | ICD-10-CM | POA: Diagnosis not present

## 2023-02-03 LAB — CBC WITH DIFFERENTIAL/PLATELET
Abs Immature Granulocytes: 0.04 10*3/uL (ref 0.00–0.07)
Basophils Absolute: 0 10*3/uL (ref 0.0–0.1)
Basophils Relative: 0 %
Eosinophils Absolute: 0.2 10*3/uL (ref 0.0–0.5)
Eosinophils Relative: 2 %
HCT: 37.4 % (ref 36.0–46.0)
Hemoglobin: 12 g/dL (ref 12.0–15.0)
Immature Granulocytes: 0 %
Lymphocytes Relative: 6 %
Lymphs Abs: 0.7 10*3/uL (ref 0.7–4.0)
MCH: 28.3 pg (ref 26.0–34.0)
MCHC: 32.1 g/dL (ref 30.0–36.0)
MCV: 88.2 fL (ref 80.0–100.0)
Monocytes Absolute: 0.9 10*3/uL (ref 0.1–1.0)
Monocytes Relative: 8 %
Neutro Abs: 9.2 10*3/uL — ABNORMAL HIGH (ref 1.7–7.7)
Neutrophils Relative %: 84 %
Platelets: 299 10*3/uL (ref 150–400)
RBC: 4.24 MIL/uL (ref 3.87–5.11)
RDW: 15.1 % (ref 11.5–15.5)
WBC: 11 10*3/uL — ABNORMAL HIGH (ref 4.0–10.5)
nRBC: 0 % (ref 0.0–0.2)

## 2023-02-03 LAB — URINALYSIS, W/ REFLEX TO CULTURE (INFECTION SUSPECTED)
Bilirubin Urine: NEGATIVE
Glucose, UA: NEGATIVE mg/dL
Hgb urine dipstick: NEGATIVE
Ketones, ur: NEGATIVE mg/dL
Nitrite: NEGATIVE
Protein, ur: NEGATIVE mg/dL
Specific Gravity, Urine: 1.013 (ref 1.005–1.030)
pH: 6 (ref 5.0–8.0)

## 2023-02-03 LAB — COMPREHENSIVE METABOLIC PANEL
ALT: 25 U/L (ref 0–44)
AST: 21 U/L (ref 15–41)
Albumin: 3 g/dL — ABNORMAL LOW (ref 3.5–5.0)
Alkaline Phosphatase: 111 U/L (ref 38–126)
Anion gap: 7 (ref 5–15)
BUN: 14 mg/dL (ref 8–23)
CO2: 26 mmol/L (ref 22–32)
Calcium: 8.3 mg/dL — ABNORMAL LOW (ref 8.9–10.3)
Chloride: 93 mmol/L — ABNORMAL LOW (ref 98–111)
Creatinine, Ser: 0.55 mg/dL (ref 0.44–1.00)
GFR, Estimated: >60 mL/min (ref >60)
Glucose, Bld: 99 mg/dL (ref 70–99)
Potassium: 3.7 mmol/L (ref 3.5–5.1)
Sodium: 126 mmol/L — ABNORMAL LOW (ref 135–145)
Total Bilirubin: 0.7 mg/dL (ref <1.2)
Total Protein: 6.2 g/dL — ABNORMAL LOW (ref 6.5–8.1)

## 2023-02-03 LAB — LIPASE, BLOOD: Lipase: 28 U/L (ref 11–51)

## 2023-02-03 LAB — TSH: TSH: 2.684 u[IU]/mL (ref 0.350–4.500)

## 2023-02-03 LAB — SODIUM: Sodium: 130 mmol/L — ABNORMAL LOW (ref 135–145)

## 2023-02-03 LAB — BRAIN NATRIURETIC PEPTIDE: B Natriuretic Peptide: 969.8 pg/mL — ABNORMAL HIGH (ref 0.0–100.0)

## 2023-02-03 MED ORDER — BISACODYL 5 MG PO TBEC
10.0000 mg | DELAYED_RELEASE_TABLET | Freq: Every day | ORAL | Status: DC | PRN
  Administered 2023-02-03 – 2023-02-06 (×2): 10 mg via ORAL
  Filled 2023-02-03 (×2): qty 2

## 2023-02-03 MED ORDER — ATORVASTATIN CALCIUM 40 MG PO TABS
40.0000 mg | ORAL_TABLET | Freq: Every evening | ORAL | Status: DC
  Administered 2023-02-03 – 2023-02-07 (×5): 40 mg via ORAL
  Filled 2023-02-03 (×5): qty 1

## 2023-02-03 MED ORDER — ENOXAPARIN SODIUM 40 MG/0.4ML IJ SOSY
40.0000 mg | PREFILLED_SYRINGE | INTRAMUSCULAR | Status: DC
  Administered 2023-02-04 – 2023-02-07 (×4): 40 mg via SUBCUTANEOUS
  Filled 2023-02-03 (×5): qty 0.4

## 2023-02-03 MED ORDER — METOPROLOL TARTRATE 5 MG/5ML IV SOLN
5.0000 mg | Freq: Once | INTRAVENOUS | Status: AC
  Administered 2023-02-03: 5 mg via INTRAVENOUS
  Filled 2023-02-03: qty 5

## 2023-02-03 MED ORDER — DICLOFENAC SODIUM 1 % EX GEL
4.0000 g | Freq: Four times a day (QID) | CUTANEOUS | Status: DC
  Administered 2023-02-05 – 2023-02-08 (×14): 4 g via TOPICAL
  Filled 2023-02-03: qty 100

## 2023-02-03 MED ORDER — MORPHINE SULFATE (PF) 2 MG/ML IV SOLN
2.0000 mg | Freq: Once | INTRAVENOUS | Status: AC
  Administered 2023-02-03: 2 mg via INTRAVENOUS
  Filled 2023-02-03: qty 1

## 2023-02-03 MED ORDER — SODIUM CHLORIDE 0.9 % IV SOLN
1.0000 g | Freq: Once | INTRAVENOUS | Status: AC
  Administered 2023-02-03: 1 g via INTRAVENOUS
  Filled 2023-02-03: qty 10

## 2023-02-03 MED ORDER — GUAIFENESIN 100 MG/5ML PO LIQD
5.0000 mL | Freq: Four times a day (QID) | ORAL | Status: DC | PRN
  Administered 2023-02-03 – 2023-02-05 (×3): 5 mL via ORAL
  Filled 2023-02-03 (×5): qty 10

## 2023-02-03 MED ORDER — SENNOSIDES-DOCUSATE SODIUM 8.6-50 MG PO TABS
1.0000 | ORAL_TABLET | Freq: Every day | ORAL | Status: DC
  Administered 2023-02-04 – 2023-02-07 (×4): 1 via ORAL
  Filled 2023-02-03 (×4): qty 1

## 2023-02-03 MED ORDER — SPIRONOLACTONE 12.5 MG HALF TABLET
12.5000 mg | ORAL_TABLET | Freq: Every day | ORAL | Status: DC
  Administered 2023-02-04 – 2023-02-08 (×5): 12.5 mg via ORAL
  Filled 2023-02-03 (×5): qty 1

## 2023-02-03 MED ORDER — FUROSEMIDE 10 MG/ML IJ SOLN
20.0000 mg | Freq: Once | INTRAMUSCULAR | Status: AC
  Administered 2023-02-03: 20 mg via INTRAVENOUS
  Filled 2023-02-03: qty 4

## 2023-02-03 MED ORDER — LOSARTAN POTASSIUM 25 MG PO TABS
12.5000 mg | ORAL_TABLET | Freq: Every day | ORAL | Status: DC
  Administered 2023-02-04: 12.5 mg via ORAL
  Filled 2023-02-03: qty 1

## 2023-02-03 MED ORDER — ACETAMINOPHEN 650 MG RE SUPP: 650.0000 mg | Freq: Four times a day (QID) | RECTAL | Status: DC | PRN

## 2023-02-03 MED ORDER — POLYETHYLENE GLYCOL 3350 17 G PO PACK: 17.0000 g | PACK | Freq: Every day | ORAL | Status: DC

## 2023-02-03 MED ORDER — METOPROLOL SUCCINATE ER 25 MG PO TB24
25.0000 mg | ORAL_TABLET | Freq: Every day | ORAL | Status: DC
  Administered 2023-02-04 – 2023-02-06 (×3): 25 mg via ORAL
  Filled 2023-02-03 (×3): qty 1

## 2023-02-03 MED ORDER — METOCLOPRAMIDE HCL 5 MG/ML IJ SOLN
5.0000 mg | Freq: Once | INTRAMUSCULAR | Status: AC
  Administered 2023-02-03: 5 mg via INTRAVENOUS
  Filled 2023-02-03: qty 2

## 2023-02-03 MED ORDER — SENNOSIDES-DOCUSATE SODIUM 8.6-50 MG PO TABS
1.0000 | ORAL_TABLET | Freq: Every evening | ORAL | Status: DC | PRN
Start: 2023-02-03 — End: 2023-02-03

## 2023-02-03 MED ORDER — SODIUM CHLORIDE 0.9 % IV SOLN: Freq: Once | INTRAVENOUS | Status: AC

## 2023-02-03 MED ORDER — ACETAMINOPHEN 325 MG PO TABS
650.0000 mg | ORAL_TABLET | Freq: Four times a day (QID) | ORAL | Status: DC | PRN
  Administered 2023-02-04 – 2023-02-07 (×4): 650 mg via ORAL
  Filled 2023-02-03 (×4): qty 2

## 2023-02-03 MED ORDER — IOHEXOL 300 MG/ML  SOLN
100.0000 mL | Freq: Once | INTRAMUSCULAR | Status: AC | PRN
  Administered 2023-02-03: 100 mL via INTRAVENOUS

## 2023-02-03 NOTE — H&P (Addendum)
History and Physical    Patient: Mary [name] WNU:[id number] DOB: 09/29/1926 DOA: 02/03/2023 DOS: the patient was seen and examined on 02/03/2023 PCP: Joselyn Arrow, MD  Patient coming from: Home  Chief Complaint:  Chief Complaint  Patient presents with   Nausea   HPI: Mary [name] is a 89 y.o. female with medical history significant of systolic heart failure (EF 45%), paroxysmal atrial tachycardia, HTN, history of invasive mammary carcinoma s/p right total mastectomy (2007), OA of knee presenting to the ED with a 2-3 week history of dry cough, SHOB, and nausea.  Patient states that she has been having a dry cough for the past 2 to 3 weeks that has progressively worsened.  This is accompanied by shortness of breath and nausea.  She has been recently using a recliner to sleep and as she is able to breathe easier when she is sitting up.  She has not had any vomiting but does note some dry heaves whenever she tries to eat food, which has led to decreased appetite.  She denies any vomiting, chest pain, palpitations, abdominal pain, fevers, chills.  She does endorse that her legs appear to be more swollen than usual.  She was admitted in August 2024 where she was found to have systolic heart failure and was noted to have small bilateral pleural effusions.  She subsequently followed Dr. Allyson Sabal with [facility] cardiology and was placed on metoprolol succinate, spironolactone, losartan, and advised to take Lasix 10 mg 3 times a week.  Patient lives alone but does have assistance at home.  Unclear on if daily weights have been taken at home, but her estimate is that her dry weight is about 116 pounds.  ED course: CBC with mild leukocytosis, otherwise unremarkable.  CMP with moderate hyponatremia.  UA unremarkable.  Chest x-ray showing likely bilateral pleural effusions.  CT abdomen pelvis showing bilateral pleural effusions along with a nodular consolidation within the lingula.  ED provider initiated course of  ceftriaxone for possible CAP.  [facility] hospitalist asked admit.  Review of Systems: As mentioned in the history of present illness. All other systems reviewed and are negative. Past Medical History:  Diagnosis Date   Alopecia    Ashkenazi Jewish ancestry    Cancer [facility])    Dyspnea    External hemorrhoid    Foley catheter in place    Heart palpitations no cardiologist--  monitored by pcp   per pt "has been on verapamil and lanoxin for years and no palpitations for a very long time"   History of acute bronchitis    dx 12-21-2014  finished [facility]PA[facility]   History of adenomatous polyp of colon    2003   History of basal cell carcinoma excision    2006  left nasolabial fold   History of benign colon tumor    2007  --  HIGH GRADE HYPERPLASTIC ,  S/P  LEFT HEMICOLECTOMY   History of breast cancer ONCOLOGIST-  DR Darnelle Catalan--  antiestogen therapy with femera completed 12/  2012--  no recurrence   dx 10/  2007  right breast Invasive DCIS, grade 2, Stage 2A, pT2  pN0 pMX,  (ER 100% /PR 3% +),  HER +3) with 1 metastatic axill node---  s/p  right mastectomy    History of kidney stones    Hypertension    Hypotonic bladder UROLOGIST-  DR Patsi Sears   Mixed hyperlipidemia    OA (osteoarthritis)    hip   Osteoporosis    DEXA 01/2009; T-3.2 L  fem neck, -2.4 L radius   Urinary retention    02-13-2015   Past Surgical History:  Procedure Laterality Date   APPENDECTOMY  age 86   BREAST LUMPECTOMY WITH NEEDLE LOCALIZATION AND AXILLARY SENTINEL LYMPH NODE BX Right 01-23-2006   CARPAL TUNNEL RELEASE Right 04-24-2007   and Pulley Release index and small finger   CATARACT EXTRACTION W/ INTRAOCULAR LENS  IMPLANT, BILATERAL  left 1991  &  right 1993   COLONOSCOPY  last one 2013   CYSTOSCOPY N/A 07/12/2015   Procedure: CYSTOSCOPY;  Surgeon: Jethro Bolus, MD;  Location: [facility];  Service: Urology;  Laterality: N/A;   CYSTOSCOPY WITH BIOPSY N/A 03/01/2015   Procedure: CYSTOSCOPY WITH TAUBER  BIOPSY OF 1CM RIGHT LATERAL BLADDER WALL AND CAUTERIZATION OF BIOPSY SITE ;  Surgeon: Jethro Bolus, MD;  Location: [facility];  Service: Urology;  Laterality: N/A;   CYSTOSCOPY WITH LITHOLAPAXY N/A 09/06/2021   Procedure: CYSTOSCOPY WITH LITHOLAPAXY < 2cm, HOLMIUM LASER LITHOTRIPSY, BILATERAL RETROGRADE PYELOGRAM, SUPRAPUBIC CATHETER EXCHANGE;  Surgeon: Jerilee Field, MD;  Location: [facility];  Service: Urology;  Laterality: N/A;  ONLY NEEDS 90 MIN   CYSTOSCOPY WITH LITHOLAPAXY N/A 01/23/2023   Procedure: CYSTOSCOPY WITH LITHOLAPAXY;  Surgeon: Jerilee Field, MD;  Location: [facility];  Service: Urology;  Laterality: N/A;   INSERTION OF SUPRAPUBIC CATHETER N/A 07/12/2015   Procedure: INSERTION OF SUPRAPUBIC CATHETER;  Surgeon: Jethro Bolus, MD;  Location: [facility] ;  Service: Urology;  Laterality: N/A;   INSERTION OF SUPRAPUBIC CATHETER N/A 01/23/2023   Procedure: SUPRAPUBIC CATHETER EXCHANGE;  Surgeon: Jerilee Field, MD;  Location: [facility];  Service: Urology;  Laterality: N/A;  75 MINS   LAPAROSCOPIC ASSISTED LEFT HEMICOLECTOMY  06/  2007   high grade hyperplastic mass   MASTECTOMY Right 02-28-2006   PARTIAL KNEE ARTHROPLASTY Right 01/24/2013   Procedure: RIGHT KNEE MEDIAL UNICOMPARTMENTAL ARTHROPLASTY;  Surgeon: Loanne Drilling, MD;  Location: [facility];  Service: Orthopedics;  Laterality: Right;   PULLEY RELEASE LEFT INDEX AND SMALL FINGER  08-07-2007   TONSILLECTOMY  age 58   Social History:  reports that she has never smoked. She has never used smokeless tobacco. She reports that she does not drink alcohol and does not use drugs.  Allergies  Allergen Reactions   Gabapentin Other (See Comments)    Nightmares    Arimidex [Anastrozole] Rash   Avelox [Moxifloxacin Hcl In Nacl] Nausea Only and Rash   Azithromycin Nausea Only and Rash   Bactrim Nausea Only and Rash   Biaxin [Clarithromycin] Nausea Only and Rash   Nitrofurantoin Nausea Only, Rash and  Other (See Comments)   Penicillins Nausea Only and Rash   Sulfa Antibiotics Nausea And Vomiting, Rash and Other (See Comments)    GI intolerance   Sulfur Nausea And Vomiting and Rash    Family History  Problem Relation Age of Onset   Hypertension Mother    Stroke Father    Diabetes Father    Hyperlipidemia Brother    Atrial fibrillation Brother    Hyperlipidemia Son    Breast cancer Paternal Aunt        dx 71s; deceased early 24s   Breast cancer Cousin        paternal first cousin   Colon cancer Cousin        paternal first cousin; age dx 41s    Prior to Admission medications   Medication Sig Start Date End Date Taking? Authorizing Provider  acetaminophen (TYLENOL)  500 MG tablet Take 1,000 mg by mouth 2 (two) times daily at 8 am and 10 pm.    [provider]  atorvastatin (LIPITOR) 40 MG tablet TAKE 1 TABLET(40 MG) BY MOUTH DAILY Patient taking differently: Take 40 mg by mouth every evening. 09/18/22   Joselyn Arrow, MD  bisacodyl (DULCOLAX) 5 MG EC tablet Take 5 mg by mouth daily as needed for moderate constipation.    [provider]  Cholecalciferol (VITAMIN D3) 50 MCG (2000 UT) TABS Take 2,000 Units by mouth daily. 08/03/20   Magrinat, Valentino Hue, MD  denosumab (PROLIA) 60 MG/ML SOLN injection Inject 60 mg into the skin every 6 (six) months. Administer in upper arm, thigh, or abdomen 10/24/12   [provider]  diazepam (VALIUM) 2 MG tablet TAKE 1 TABLET(2 MG) BY MOUTH EVERY 8 HOURS AS NEEDED FOR MUSCLE SPASMS Patient not taking: Reported on 02/01/2023 09/27/22   Joselyn Arrow, MD  fluticasone (CUTIVATE) 0.005 % ointment Apply 1 Application topically daily as needed (irritation). Patient not taking: Reported on 02/01/2023    [provider]  furosemide (LASIX) 20 MG tablet Take 0.5 tablets (10 mg total) by mouth every other day. Patient taking differently: Take 10 mg by mouth 3 (three) times a week. 01/03/23 04/03/23  Runell Gess, MD  gabapentin  (NEURONTIN) 100 MG capsule Take 1 capsule by mouth 30 minutes prior to bedtime.  You may increase to 2 capsules after 1 week, if needed Patient not taking: Reported on 02/01/2023 12/20/22   Joselyn Arrow, MD  LITHOSTAT 250 MG TABS Take 1 tablet by mouth daily. 01/26/23   [provider]  loratadine (CLARITIN) 10 MG tablet Take 10 mg by mouth daily as needed for allergies. Patient not taking: Reported on 02/01/2023    [provider]  losartan (COZAAR) 25 MG tablet Take 0.5 tablets (12.5 mg total) by mouth daily. 12/06/22   Laurey Morale, MD  metoprolol succinate (TOPROL-XL) 25 MG 24 hr tablet Take 1 tablet (25 mg total) by mouth daily. Take with or immediately following a meal. 12/06/22   Laurey Morale, MD  Multiple Vitamins-Minerals (PRESERVISION AREDS 2) CAPS Take 1 capsule by mouth 2 (two) times daily.    [provider]  ondansetron (ZOFRAN ODT) 4 MG disintegrating tablet Take 1 tablet (4 mg total) by mouth every 8 (eight) hours as needed for nausea or vomiting. 02/01/23   Joselyn Arrow, MD  Polyvinyl Alcohol-Povidone (REFRESH OP) Place 1 drop into both eyes 2 (two) times daily.    [provider]  pseudoephedrine-dextromethorphan-guaifenesin (ROBITUSSIN-PE) 30-10-100 MG/5ML solution Take 10 mLs by mouth 4 (four) times daily as needed for cough.    [provider]  spironolactone (ALDACTONE) 25 MG tablet Take 0.5 tablets (12.5 mg total) by mouth daily. 12/06/22   Laurey Morale, MD    Physical Exam: Vitals:   02/03/23 1626 02/03/23 1633 02/03/23 1745 02/03/23 2031  BP:  (!) 165/126 (!) 164/131 (!) 157/101  Pulse:  (!) 47 (!) 53 78  Resp:  16 18 18   Temp:  97.6 F (36.4 C)  (!) 97.4 F (36.3 C)  TempSrc:  Oral  Oral  SpO2:  100% 95% 95%  Weight: 54 kg     Height: 5\' 3"  (1.6 m)      Physical Exam Constitutional:      Appearance: She is normal weight.     Comments: Chronically ill appearing  HENT:     Head: Normocephalic and atraumatic.  Nose: Nose normal. No congestion or rhinorrhea.     Mouth/Throat:     Mouth: Mucous membranes are moist.     Pharynx: Oropharynx is clear. No oropharyngeal exudate.  Eyes:     General: No scleral icterus.    Extraocular Movements: Extraocular movements intact.     Conjunctiva/sclera: Conjunctivae normal.     Pupils: Pupils are equal, round, and reactive to light.  Cardiovascular:     Rate and Rhythm: Normal rate and regular rhythm.     Pulses: Normal pulses.     Heart sounds: Normal heart sounds. No murmur heard.    No friction rub. No gallop.  Pulmonary:     Effort: Pulmonary effort is normal. No respiratory distress.     Breath sounds: Normal breath sounds. No wheezing or rhonchi.     Comments: Crackles at bilateral lower lung fields. Abdominal:     General: Bowel sounds are normal. There is distension.     Palpations: Abdomen is soft.     Tenderness: There is no abdominal tenderness. There is no guarding or rebound.  Musculoskeletal:     Comments: 1-2+ pitting edema in bilateral lower extremities  Skin:    General: Skin is warm and dry.  Neurological:     General: No focal deficit present.     Mental Status: She is alert and oriented to person, place, and time.  Psychiatric:        Mood and Affect: Mood normal.        Behavior: Behavior normal.     Data Reviewed:      Latest Ref Rng & Units 02/03/2023    4:56 PM 01/17/2023    2:16 PM 12/18/2022    8:38 AM  CBC  WBC 4.0 - 10.5 K/uL 11.0  9.0  7.2   Hemoglobin 12.0 - 15.0 g/dL 32.4  40.1  02.7   Hematocrit 36.0 - 46.0 % 37.4  40.8  43.6   Platelets 150 - 400 K/uL 299  276  233       Latest Ref Rng & Units 02/03/2023    4:56 PM 01/03/2023    1:51 PM 12/18/2022    8:38 AM  CMP  Glucose 70 - 99 mg/dL 99  253  664   BUN 8 - 23 mg/dL 14  20  15    Creatinine 0.44 - 1.00 mg/dL 4.03  4.74  2.59   Sodium 135 - 145 mmol/L 126  138  133   Potassium 3.5 - 5.1 mmol/L 3.7  4.4  3.5   Chloride 98 - 111 mmol/L 93  97  95    CO2 22 - 32 mmol/L 26  28  25    Calcium 8.9 - 10.3 mg/dL 8.3  9.8  9.4   Total Protein 6.5 - 8.1 g/dL 6.2   7.3   Total Bilirubin <1.2 mg/dL 0.7   1.1   Alkaline Phos 38 - 126 U/L 111   76   AST 15 - 41 U/L 21   20   ALT 0 - 44 U/L 25   22    Lipase     Component Value Date/Time   LIPASE 28 02/03/2023 1656   Urinalysis    Component Value Date/Time   COLORURINE STRAW (A) 02/03/2023 1926   APPEARANCEUR CLEAR 02/03/2023 1926   LABSPEC 1.013 02/03/2023 1926   PHURINE 6.0 02/03/2023 1926   GLUCOSEU NEGATIVE 02/03/2023 1926   HGBUR NEGATIVE 02/03/2023 1926   BILIRUBINUR NEGATIVE 02/03/2023 1926   BILIRUBINUR  negative 12/25/2020 1629   BILIRUBINUR neg 04/10/2016 1442   KETONESUR NEGATIVE 02/03/2023 1926   PROTEINUR NEGATIVE 02/03/2023 1926   UROBILINOGEN 0.2 12/25/2020 1629   UROBILINOGEN 0.2 02/22/2013 0447   NITRITE NEGATIVE 02/03/2023 1926   LEUKOCYTESUR TRACE (A) 02/03/2023 1926    CT ABDOMEN PELVIS W CONTRAST  Result Date: 02/03/2023 CLINICAL DATA:  Nausea, dry heaves for several days, suspected bowel obstruction EXAM: CT ABDOMEN AND PELVIS WITH CONTRAST TECHNIQUE: Multidetector CT imaging of the abdomen and pelvis was performed using the standard protocol following bolus administration of intravenous contrast. RADIATION DOSE REDUCTION: This exam was performed according to the departmental dose-optimization program which includes automated exposure control, adjustment of the mA and/or kV according to patient size and/or use of iterative reconstruction technique. CONTRAST:  OMNIPAQUE IOHEXOL 300 MG/ML  SOLN COMPARISON:  12/14/2022 FINDINGS: Lower chest: Free-flowing bilateral pleural effusions, volume estimated the 500 cc. Nonspecific nodular consolidation within the lingula, measuring up to 17 mm. This could reflect localized infection or inflammation, and follow-up is recommended to exclude pulmonary mass. Dependent consolidation within the lower lobes. Cardiomegaly, with  prominent calcifications of the mitral annulus and biatrial dilatation. Hepatobiliary: No focal liver abnormality is seen. No gallstones, gallbladder wall thickening, or biliary dilatation. Pancreas: Unremarkable. No pancreatic ductal dilatation or surrounding inflammatory changes. Spleen: Normal in size without focal abnormality. Adrenals/Urinary Tract: Bilateral renal cortical cysts do not require specific imaging follow-up. No renal calculi or obstructive uropathy. The adrenals are unremarkable. Bladder is decompressed by a suprapubic catheter. There are 2 small remaining bladder calculi measuring up to 4 mm. The remaining calculi have been evacuated in the interim. Stomach/Bowel: Postsurgical changes from partial distal colectomy and reanastomosis. No bowel obstruction or ileus. Moderate retained stool within the colon. No bowel wall thickening or inflammatory change. Vascular/Lymphatic: Aortic atherosclerosis. No enlarged abdominal or pelvic lymph nodes. Reproductive: Uterus is atrophic.  No adnexal masses. Other: No free fluid or free intraperitoneal gas. No abdominal wall hernia. Musculoskeletal: No acute displaced fractures. Severe bilateral hip osteoarthritis again noted, right greater than left. Bilateral hip effusions are unchanged. Reconstructed images demonstrate no additional findings. IMPRESSION: 1. Bilateral pleural effusions. 2. Nodular consolidation within the lingula, which may reflect localized inflammation or infection. Follow-up imaging is recommended to document resolution and exclude parenchymal lung nodules. 3. No bowel obstruction or ileus. Moderate retained stool throughout the colon. 4. Small residual bladder calculi as above. The majority of the calculi have been evacuated since previous exam. Stable suprapubic catheter in place. 5.  Aortic Atherosclerosis (ICD10-I70.0). 6. Stable degenerative changes of the bilateral hips, right greater than left. Electronically Signed   By: Sharlet Salina M.D.   On: 02/03/2023 19:10   DG Chest Portable 1 View  Result Date: 02/03/2023 CLINICAL DATA:  Cough. Several day history of nausea and dry heaving EXAM: PORTABLE CHEST 1 VIEW COMPARISON:  Chest radiograph dated 10/30/2022 FINDINGS: Hyperinflated lungs. No focal consolidations. Mild blunting of bilateral costophrenic angles. No pneumothorax. Similar mildly enlarged cardiomediastinal silhouette. No acute osseous abnormality. IMPRESSION: 1. Hyperinflated lungs, which can be seen in the setting of COPD. 2. Mild blunting of bilateral costophrenic angles, which may represent trace pleural effusions or pleural thickening. 3. Similar mild cardiomegaly. Electronically Signed   By: Agustin Cree M.D.   On: 02/03/2023 19:06     Assessment and Plan: No notes have been filed under this hospital service. Service: Hospitalist  Systolic heart failure exacerbation Previous echo on 8/13 showing LVEF 45% with mildly decreased LV  function, severely dilated left atrium, severe mitral annular calcification.  She follows Dr. Allyson Sabal in the outpatient setting.  Current GDMT includes metoprolol succinate 25 mg daily, spironolactone 12.5 mg daily, losartan 12.5 mg daily.  She is also on Lasix 10 mg 3 times a week but unclear on consistency of use.  On exam, it is difficult to appreciate JVD.  However she does have bilateral pleural effusions and has 1-2+ pitting edema in bilateral lower extremities.  Her weight is about 119 pounds which is about 3 pounds over her estimated dry weight.  I do believe that her shortness of breath and dry cough are probably more linked to her underlying CHF exacerbation.  She does have a lingular opacity on imaging concerning for possible pneumonia versus inflammation, though she has not spiked any fevers or have any infectious symptoms to place this higher on my differential.  Will check BNP to help differentiate.  Will stop ceftriaxone and monitor WBC curve and fever curve.  I will provide a  dose of IV 20 mg Lasix to promote diuresis to see if this helps resolve her symptoms. -f/u BNP, TSH -f/u ECHO -continue toprol-xl 25mg  daily -continue spironolactone 12.5mg  daily -continue losartan 12.5mg  daily -1 dose of IV lasix 20mg   -Supplemental oxygen as needed to maintain O2 sats greater than 90% -Daily weights -Strict I/O's -PT/OT eval  Lingular opacity? Nodular consolidation within the lingula noted on CT abdomen pelvis.  I am not sold that she has an underlying pneumonia.  She does have a mild leukocytosis but she has remained afebrile.  Her dry cough and shortness of breath could be secondary to underlying pleural effusions instead.  Will stop ceftriaxone (1 dose received in the ED) and trend fever curve and WBC curve.  Can restart antibiotics should she spike a fever. -Stop ceftriaxone, can restart if infection declares itself  Moderate Hyponatremia Likely secondary to volume overload in the setting of CHF exacerbation. She has a history of chronic hyponatremia per chart review dating back to about 3 months ago. Will provide diuresis and reassess sodium level tomorrow. Can consider further workup if not corrected. -IV diuresis as above -recheck sodium  Atrial premature complexes Paroxysmal atrial tachycardia RBBB Patient with known history of RBBB and paroxysmal atrial tachycardia.  She is on Toprol-XL to manage CHF and concomitant atrial tachycardia.  She did have an episode where her heart rate entered the 150s, requiring an IV push of toprol 5mg .  She was asymptomatic during this episode.  Repeat EKG showed sinus rhythm with atrial premature complexes. -Telemetry -Can add IV Toprol prn if reentering SVT   Constipation Retained stool noted in CT imaging. Last bowel movement about 2 days ago per patient. -Miralax daily -Senna-s qhs  Hypertension -Continue home Toprol-XL, losartan, spironolactone as noted above  OA of right knee -voltaren gel QID  Restless Leg  Syndrome Was previously on gabapentin but had nightmares while taking so this was discontinued. Continue to monitor for symptoms.  History of invasive mammary carcinoma Status post total right mastectomy in 02/2006.    Advance Care Planning:   Code Status: Limited: Do not attempt resuscitation (DNR) -DNR-LIMITED -Do Not Intubate/DNI  Discussed with patient and family in the room. Patient has DNR order at home and this best aligns with her wishes.  Consults: none  Family Communication: spoke with family at bedside  Severity of Illness: The appropriate patient status for this patient is OBSERVATION. Observation status is judged to be reasonable and necessary in order  to provide the required intensity of service to ensure the patient's safety. The patient's presenting symptoms, physical exam findings, and initial radiographic and laboratory data in the context of their medical condition is felt to place them at decreased risk for further clinical deterioration. Furthermore, it is anticipated that the patient will be medically stable for discharge from the hospital within 2 midnights of admission.   Portions of this note were generated with Scientist, clinical (histocompatibility and immunogenetics). Dictation errors may occur despite best attempts at proofreading.  Author: Briscoe Burns, MD 02/03/2023 9:31 PM  For on call review [email or web address].

## 2023-02-03 NOTE — ED Triage Notes (Signed)
Pt BIBA from home. C/o nausea and dry heaving for several days. Abdomen does appear distended, but pt states that is normal.  AOx4

## 2023-02-03 NOTE — ED Notes (Signed)
ED TO INPATIENT HANDOFF REPORT  ED Nurse Name and Phone #: Melburn Hake Name/Age/Gender Mary [name] 89 y.o. female Room/Bed: WA07/WA07  Code Status   Code Status: Full Code  Home/SNF/Other Skilled nursing facility Patient oriented to: situation Is this baseline? Yes   Triage Complete: Triage complete  Chief Complaint Acute exacerbation of congestive heart failure (HCC) [I50.9]  Triage Note Pt BIBA from home. C/o nausea and dry heaving for several days. Abdomen does appear distended, but pt states that is normal.  AOx4   Allergies Allergies  Allergen Reactions   Arimidex [Anastrozole] Rash   Avelox [Moxifloxacin Hcl In Nacl] Nausea Only and Rash   Azithromycin Nausea Only and Rash   Bactrim Nausea Only and Rash   Biaxin [Clarithromycin] Nausea Only and Rash   Nitrofurantoin Nausea Only, Rash and Other (See Comments)   Penicillins Nausea Only and Rash   Sulfa Antibiotics Nausea And Vomiting and Rash   Sulfasalazine Rash and Other (See Comments)    GI intolerance    Sulfur Nausea And Vomiting and Rash    Level of Care/Admitting Diagnosis ED Disposition     ED Disposition  Admit   Condition  --   Comment  Hospital Area: [facility] [[id number]]  Level of Care: Telemetry [5]  Admit to tele based on following criteria: Monitor for Ischemic changes  Admit to tele based on following criteria: Acute CHF  May place patient in observation at [facility] or [facility] if equivalent level of care is available:: No  Covid Evaluation: Asymptomatic - no recent exposure (last 10 days) testing not required  Diagnosis: Acute exacerbation of congestive heart failure [facility]) [[id number]]  Admitting Physician: Briscoe Burns [[id number]]  Attending Physician: Briscoe Burns [[id number]]          B Medical/Surgery History Past Medical History:  Diagnosis Date   Alopecia    Ashkenazi Jewish ancestry    Cancer [facility])    Dyspnea    External hemorrhoid    Foley  catheter in place    Heart palpitations no cardiologist--  monitored by pcp   per pt "has been on verapamil and lanoxin for years and no palpitations for a very long time"   History of acute bronchitis    dx 12-21-2014  finished [facility]   History of adenomatous polyp of colon    2003   History of basal cell carcinoma excision    2006  left nasolabial fold   History of benign colon tumor    2007  --  HIGH GRADE HYPERPLASTIC ,  S/P  LEFT HEMICOLECTOMY   History of breast cancer ONCOLOGIST-  DR Darnelle Catalan--  antiestogen therapy with femera completed 12/  2012--  no recurrence   dx 10/  2007  right breast Invasive DCIS, grade 2, Stage 2A, pT2  pN0 pMX,  (ER 100% /PR 3% +),  HER +3) with 1 metastatic axill node---  s/p  right mastectomy    History of kidney stones    Hypertension    Hypotonic bladder UROLOGIST-  DR Patsi Sears   Mixed hyperlipidemia    OA (osteoarthritis)    hip   Osteoporosis    DEXA 01/2009; T-3.2 L fem neck, -2.4 L radius   Urinary retention    02-13-2015   Past Surgical History:  Procedure Laterality Date   APPENDECTOMY  age 38   BREAST LUMPECTOMY WITH NEEDLE LOCALIZATION AND AXILLARY SENTINEL LYMPH NODE BX Right 01-23-2006   CARPAL TUNNEL RELEASE Right 04-24-2007  and Pulley Release index and small finger   CATARACT EXTRACTION W/ INTRAOCULAR LENS  IMPLANT, BILATERAL  left 1991  &  right 1993   COLONOSCOPY  last one 2013   CYSTOSCOPY N/A 07/12/2015   Procedure: CYSTOSCOPY;  Surgeon: Jethro Bolus, MD;  Location[facility];  Service: Urology;  Laterality: N/A;   CYSTOSCOPY WITH BIOPSY N/A 03/01/2015   Procedure: CYSTOSCOPY WITH TAUBER BIOPSY OF 1CM RIGHT LATERAL BLADDER WALL AND CAUTERIZATION OF BIOPSY SITE ;  Surgeon: Jethro Bolus, MD;  Location: [facility];  Service: Urology;  Laterality: N/A;   CYSTOSCOPY WITH LITHOLAPAXY N/A 09/06/2021   Procedure: CYSTOSCOPY WITH LITHOLAPAXY < 2cm, HOLMIUM LASER LITHOTRIPSY, BILATERAL  RETROGRADE PYELOGRAM, SUPRAPUBIC CATHETER EXCHANGE;  Surgeon: Jerilee Field, MD;  Location: [facility];  Service: Urology;  Laterality: N/A;  ONLY NEEDS 90 MIN   CYSTOSCOPY WITH LITHOLAPAXY N/A 01/23/2023   Procedure: CYSTOSCOPY WITH LITHOLAPAXY;  Surgeon: Jerilee Field, MD;  Location: [facility];  Service: Urology;  Laterality: N/A;   INSERTION OF SUPRAPUBIC CATHETER N/A 07/12/2015   Procedure: INSERTION OF SUPRAPUBIC CATHETER;  Surgeon: Jethro Bolus, MD;  Location: [facility] [place];  Service: Urology;  Laterality: N/A;   INSERTION OF SUPRAPUBIC CATHETER N/A 01/23/2023   Procedure: SUPRAPUBIC CATHETER EXCHANGE;  Surgeon: Jerilee Field, MD;  Location: [facility];  Service: Urology;  Laterality: N/A;  75 MINS   LAPAROSCOPIC ASSISTED LEFT HEMICOLECTOMY  06/  2007   high grade hyperplastic mass   MASTECTOMY Right 02-28-2006   PARTIAL KNEE ARTHROPLASTY Right 01/24/2013   Procedure: RIGHT KNEE MEDIAL UNICOMPARTMENTAL ARTHROPLASTY;  Surgeon: Loanne Drilling, MD;  Location: [facility];  Service: Orthopedics;  Laterality: Right;   PULLEY RELEASE LEFT INDEX AND SMALL FINGER  08-07-2007   TONSILLECTOMY  age 86     A IV Location/Drains/Wounds Patient Lines/Drains/Airways Status     Active Line/Drains/Airways     Name Placement date Placement time Site Days   Peripheral IV 02/03/23 Anterior;Left Forearm 02/03/23  1542  Forearm  less than 1   Suprapubic Catheter Double-lumen 16 Fr. 03/22/22  1125  Double-lumen  318   Suprapubic Catheter Latex;Double-lumen 18 Fr. 01/23/23  0950  Latex;Double-lumen  11   Airway 01/23/23  0918  -- 11            Intake/Output Last 24 hours No intake or output data in the 24 hours ending 02/03/23 2038  Labs/Imaging Results for orders placed or performed during the hospital encounter of 02/03/23 (from the past 48 hour(s))  CBC with Differential     Status: Abnormal   Collection Time: 02/03/23  4:56 PM  Result Value Ref Range   WBC 11.0 (H) 4.0 - 10.5  K/uL   RBC 4.24 3.87 - 5.11 MIL/uL   Hemoglobin 12.0 12.0 - 15.0 g/dL   HCT 40.1 02.7 - 25.3 %   MCV 88.2 80.0 - 100.0 fL   MCH 28.3 26.0 - 34.0 pg   MCHC 32.1 30.0 - 36.0 g/dL   RDW 66.4 40.3 - 47.4 %   Platelets 299 150 - 400 K/uL   nRBC 0.0 0.0 - 0.2 %   Neutrophils Relative % 84 %   Neutro Abs 9.2 (H) 1.7 - 7.7 K/uL   Lymphocytes Relative 6 %   Lymphs Abs 0.7 0.7 - 4.0 K/uL   Monocytes Relative 8 %   Monocytes Absolute 0.9 0.1 - 1.0 K/uL   Eosinophils Relative 2 %   Eosinophils Absolute 0.2 0.0 - 0.5 K/uL  Basophils Relative 0 %   Basophils Absolute 0.0 0.0 - 0.1 K/uL   Immature Granulocytes 0 %   Abs Immature Granulocytes 0.04 0.00 - 0.07 K/uL    Comment: Performed at [facility], [address], [place], [place] [address]  Comprehensive metabolic panel     Status: Abnormal   Collection Time: 02/03/23  4:56 PM  Result Value Ref Range   Sodium 126 (L) 135 - 145 mmol/L   Potassium 3.7 3.5 - 5.1 mmol/L   Chloride 93 (L) 98 - 111 mmol/L   CO2 26 22 - 32 mmol/L   Glucose, Bld 99 70 - 99 mg/dL    Comment: Glucose reference range applies only to samples taken after fasting for at least 8 hours.   BUN 14 8 - 23 mg/dL   Creatinine, Ser 0.98 0.44 - 1.00 mg/dL   Calcium 8.3 (L) 8.9 - 10.3 mg/dL   Total Protein 6.2 (L) 6.5 - 8.1 g/dL   Albumin 3.0 (L) 3.5 - 5.0 g/dL   AST 21 15 - 41 U/L   ALT 25 0 - 44 U/L   Alkaline Phosphatase 111 38 - 126 U/L   Total Bilirubin 0.7 <1.2 mg/dL   GFR, Estimated >11 >91 mL/min    Comment: (NOTE) Calculated using the CKD-EPI Creatinine Equation (2021)    Anion gap 7 5 - 15    Comment: Performed at [facility], [address], [place], [place] [address]  Lipase, blood     Status: None   Collection Time: 02/03/23  4:56 PM  Result Value Ref Range   Lipase 28 11 - 51 U/L    Comment: Performed at [facility], [address], [place], [place] [address]  Urinalysis, w/ Reflex to Culture  (Infection Suspected) -Urine, Suprapubic     Status: Abnormal   Collection Time: 02/03/23  7:26 PM  Result Value Ref Range   Specimen Source URINE, SUPRAPUBIC    Color, Urine STRAW (A) YELLOW   APPearance CLEAR CLEAR   Specific Gravity, Urine 1.013 1.005 - 1.030   pH 6.0 5.0 - 8.0   Glucose, UA NEGATIVE NEGATIVE mg/dL   Hgb urine dipstick NEGATIVE NEGATIVE   Bilirubin Urine NEGATIVE NEGATIVE   Ketones, ur NEGATIVE NEGATIVE mg/dL   Protein, ur NEGATIVE NEGATIVE mg/dL   Nitrite NEGATIVE NEGATIVE   Leukocytes,Ua TRACE (A) NEGATIVE   RBC / HPF 0-5 0 - 5 RBC/hpf   WBC, UA 0-5 0 - 5 WBC/hpf    Comment:        Reflex urine culture not performed if WBC <=10, OR if Squamous epithelial cells >5. If Squamous epithelial cells >5 suggest recollection.    Bacteria, UA RARE (A) NONE SEEN   Squamous Epithelial / HPF 0-5 0 - 5 /HPF   Mucus PRESENT     Comment: Performed at [facility], [address] [place], [place] [address]   CT ABDOMEN PELVIS W CONTRAST  Result Date: 02/03/2023 CLINICAL DATA:  Nausea, dry heaves for several days, suspected bowel obstruction EXAM: CT ABDOMEN AND PELVIS WITH CONTRAST TECHNIQUE: Multidetector CT imaging of the abdomen and pelvis was performed using the standard protocol following bolus administration of intravenous contrast. RADIATION DOSE REDUCTION: This exam was performed according to the departmental dose-optimization program which includes automated exposure control, adjustment of the mA and/or kV according to patient size and/or use of iterative reconstruction technique. CONTRAST:  OMNIPAQUE IOHEXOL 300 MG/ML  SOLN COMPARISON:  12/14/2022  FINDINGS: Lower chest: Free-flowing bilateral pleural effusions, volume estimated the 500 cc. Nonspecific nodular consolidation within the lingula, measuring up to 17 mm. This could reflect localized infection or inflammation, and follow-up is recommended to exclude pulmonary mass. Dependent  consolidation within the lower lobes. Cardiomegaly, with prominent calcifications of the mitral annulus and biatrial dilatation. Hepatobiliary: No focal liver abnormality is seen. No gallstones, gallbladder wall thickening, or biliary dilatation. Pancreas: Unremarkable. No pancreatic ductal dilatation or surrounding inflammatory changes. Spleen: Normal in size without focal abnormality. Adrenals/Urinary Tract: Bilateral renal cortical cysts do not require specific imaging follow-up. No renal calculi or obstructive uropathy. The adrenals are unremarkable. Bladder is decompressed by a suprapubic catheter. There are 2 small remaining bladder calculi measuring up to 4 mm. The remaining calculi have been evacuated in the interim. Stomach/Bowel: Postsurgical changes from partial distal colectomy and reanastomosis. No bowel obstruction or ileus. Moderate retained stool within the colon. No bowel wall thickening or inflammatory change. Vascular/Lymphatic: Aortic atherosclerosis. No enlarged abdominal or pelvic lymph nodes. Reproductive: Uterus is atrophic.  No adnexal masses. Other: No free fluid or free intraperitoneal gas. No abdominal wall hernia. Musculoskeletal: No acute displaced fractures. Severe bilateral hip osteoarthritis again noted, right greater than left. Bilateral hip effusions are unchanged. Reconstructed images demonstrate no additional findings. IMPRESSION: 1. Bilateral pleural effusions. 2. Nodular consolidation within the lingula, which may reflect localized inflammation or infection. Follow-up imaging is recommended to document resolution and exclude parenchymal lung nodules. 3. No bowel obstruction or ileus. Moderate retained stool throughout the colon. 4. Small residual bladder calculi as above. The majority of the calculi have been evacuated since previous exam. Stable suprapubic catheter in place. 5.  Aortic Atherosclerosis (ICD10-I70.0). 6. Stable degenerative changes of the bilateral hips, right  greater than left. Electronically Signed   By: Sharlet Salina M.D.   On: 02/03/2023 19:10   DG Chest Portable 1 View  Result Date: 02/03/2023 CLINICAL DATA:  Cough. Several day history of nausea and dry heaving EXAM: PORTABLE CHEST 1 VIEW COMPARISON:  Chest radiograph dated 10/30/2022 FINDINGS: Hyperinflated lungs. No focal consolidations. Mild blunting of bilateral costophrenic angles. No pneumothorax. Similar mildly enlarged cardiomediastinal silhouette. No acute osseous abnormality. IMPRESSION: 1. Hyperinflated lungs, which can be seen in the setting of COPD. 2. Mild blunting of bilateral costophrenic angles, which may represent trace pleural effusions or pleural thickening. 3. Similar mild cardiomegaly. Electronically Signed   By: Agustin Cree M.D.   On: 02/03/2023 19:06    Pending Labs Unresulted Labs (From admission, onward)     Start     Ordered   02/04/23 0500  Comprehensive metabolic panel  Tomorrow morning,   R        02/03/23 2032   02/04/23 0500  CBC  Tomorrow morning,   R        02/03/23 2032   02/03/23 2032  TSH  Once,   R        02/03/23 2032   02/03/23 2029  Brain natriuretic peptide  (Heart Failure)  Once,   R        02/03/23 2032            Vitals/Pain Today's Vitals   02/03/23 1626 02/03/23 1633 02/03/23 1745 02/03/23 2031  BP:  (!) 165/126 (!) 164/131 (!) 157/101  Pulse:  (!) 47 (!) 53 78  Resp:  16 18 18   Temp:  97.6 F (36.4 C)  (!) 97.4 F (36.3 C)  TempSrc:  Oral  Oral  SpO2:  100% 95% 95%  Weight: 54 kg     Height: 5\' 3"  (1.6 m)     PainSc: 0-No pain       Isolation Precautions No active isolations  Medications Medications  cefTRIAXone (ROCEPHIN) 1 g in sodium chloride 0.9 % 100 mL IVPB (1 g Intravenous New Bag/Given 02/03/23 2013)  furosemide (LASIX) injection 20 mg (has no administration in time range)  enoxaparin (LOVENOX) injection 40 mg (has no administration in time range)  acetaminophen (TYLENOL) tablet 650 mg (has no administration in  time range)    Or  acetaminophen (TYLENOL) suppository 650 mg (has no administration in time range)  senna-docusate (Senokot-S) tablet 1 tablet (has no administration in time range)  metoCLOPramide (REGLAN) injection 5 mg (5 mg Intravenous Given 02/03/23 1711)  iohexol (OMNIPAQUE) 300 MG/ML solution 100 mL (100 mLs Intravenous Contrast Given 02/03/23 1758)  morphine (PF) 2 MG/ML injection 2 mg (2 mg Intravenous Given 02/03/23 2014)  0.9 %  sodium chloride infusion ( Intravenous New Bag/Given 02/03/23 2013)  metoprolol tartrate (LOPRESSOR) injection 5 mg (5 mg Intravenous Given 02/03/23 2018)    Mobility walks with device     Focused Assessments Pulmonary Assessment Handoff:  Lung sounds:          R Recommendations: See Admitting Provider Note  Report given to:   Additional Notes: N/A

## 2023-02-03 NOTE — ED Notes (Signed)
Patients HR noted to be 150's. Denies chest pain. EKG captured and given to Jearld Fenton, MD.

## 2023-02-03 NOTE — ED Notes (Signed)
Called out pt. Heart rate 155, RN made aware.

## 2023-02-03 NOTE — ED Provider Notes (Cosign Needed Addendum)
[place] EMERGENCY DEPARTMENT AT [facility] Provider Note   CSN: [id number] Arrival date & time: 02/03/23  1607     History  Chief Complaint  Patient presents with   Nausea    Mary [name] is a 89 y.o. female.  Patient to ED for management of nausea with dry heaves she reports as starting about 4 days ago. No known fever. Not really bringing up anything. She also has a cough that is a dry cough that has been going on for some time. No congestion or sore throat. She reports her last bowel movement was several days ago, and reports a history of constipation. Denies abdominal surgeries in the past. Is not passing any gas. Denies abdominal pain or distention.   The history is provided by the patient. No language interpreter was used.       Home Medications Prior to Admission medications   Medication Sig Start Date End Date Taking? Authorizing Provider  acetaminophen (TYLENOL) 500 MG tablet Take 1,000 mg by mouth 2 (two) times daily at 8 am and 10 pm.    [provider]  atorvastatin (LIPITOR) 40 MG tablet TAKE 1 TABLET(40 MG) BY MOUTH DAILY Patient taking differently: Take 40 mg by mouth every evening. 09/18/22   Joselyn Arrow, MD  bisacodyl (DULCOLAX) 5 MG EC tablet Take 5 mg by mouth daily as needed for moderate constipation.    [provider]  Cholecalciferol (VITAMIN D3) 50 MCG (2000 UT) TABS Take 2,000 Units by mouth daily. 08/03/20   Magrinat, Valentino Hue, MD  denosumab (PROLIA) 60 MG/ML SOLN injection Inject 60 mg into the skin every 6 (six) months. Administer in upper arm, thigh, or abdomen 10/24/12   [provider]  diazepam (VALIUM) 2 MG tablet TAKE 1 TABLET(2 MG) BY MOUTH EVERY 8 HOURS AS NEEDED FOR MUSCLE SPASMS Patient not taking: Reported on 02/01/2023 09/27/22   Joselyn Arrow, MD  fluticasone (CUTIVATE) 0.005 % ointment Apply 1 Application topically daily as needed (irritation). Patient not taking: Reported on 02/01/2023    [provider]  furosemide (LASIX) 20 MG tablet Take 0.5 tablets (10 mg total) by mouth every other day. Patient taking differently: Take 10 mg by mouth 3 (three) times a week. 01/03/23 04/03/23  Runell Gess, MD  gabapentin (NEURONTIN) 100 MG capsule Take 1 capsule by mouth 30 minutes prior to bedtime.  You may increase to 2 capsules after 1 week, if needed Patient not taking: Reported on 02/01/2023 12/20/22   Joselyn Arrow, MD  LITHOSTAT 250 MG TABS Take 1 tablet by mouth daily. 01/26/23   [provider]  loratadine (CLARITIN) 10 MG tablet Take 10 mg by mouth daily as needed for allergies. Patient not taking: Reported on 02/01/2023    [provider]  losartan (COZAAR) 25 MG tablet Take 0.5 tablets (12.5 mg total) by mouth daily. 12/06/22   Laurey Morale, MD  metoprolol succinate (TOPROL-XL) 25 MG 24 hr tablet Take 1 tablet (25 mg total) by mouth daily. Take with or immediately following a meal. 12/06/22   Laurey Morale, MD  Multiple Vitamins-Minerals (PRESERVISION AREDS 2) CAPS Take 1 capsule by mouth 2 (two) times daily.    [provider]  ondansetron (ZOFRAN ODT) 4 MG disintegrating tablet Take 1 tablet (4 mg total) by mouth every 8 (eight) hours as needed for nausea or vomiting. 02/01/23   Joselyn Arrow, MD  Polyvinyl Alcohol-Povidone (REFRESH OP) Place 1 drop into both eyes 2 (two) times  daily.    [provider]  pseudoephedrine-dextromethorphan-guaifenesin (ROBITUSSIN-PE) 30-10-100 MG/5ML solution Take 10 mLs by mouth 4 (four) times daily as needed for cough.    [provider]  spironolactone (ALDACTONE) 25 MG tablet Take 0.5 tablets (12.5 mg total) by mouth daily. 12/06/22   Laurey Morale, MD      Allergies    Arimidex [anastrozole], Avelox [moxifloxacin hcl in nacl], Azithromycin, Bactrim, Biaxin [clarithromycin], Nitrofurantoin, Penicillins, Sulfa antibiotics, Sulfasalazine, and Sulfur    Review of Systems   Review of  Systems  Physical Exam Updated Vital Signs BP (!) 164/131 (BP Location: Right Arm)   Pulse (!) 53   Temp 97.6 F (36.4 C) (Oral)   Resp 18   Ht 5\' 3"  (1.6 m)   Wt 54 kg   LMP  (LMP Unknown)   SpO2 95%   BMI 21.09 kg/m  Physical Exam Vitals and nursing note reviewed.  Constitutional:      General: She is not in acute distress. HENT:     Mouth/Throat:     Mouth: Mucous membranes are moist.  Eyes:     Extraocular Movements: Extraocular movements intact.  Cardiovascular:     Rate and Rhythm: Normal rate and regular rhythm.     Heart sounds: No murmur heard. Pulmonary:     Effort: Pulmonary effort is normal.     Breath sounds: No wheezing, rhonchi or rales.     Comments: Frequent dry cough.  Abdominal:     General: Bowel sounds are absent.     Palpations: Abdomen is soft.     Tenderness: There is no abdominal tenderness.     Comments: Midline and RLQ surgical scars present.  Musculoskeletal:     Cervical back: Normal range of motion and neck supple.  Skin:    General: Skin is warm and dry.     ED Results / Procedures / Treatments   Labs (all labs ordered are listed, but only abnormal results are displayed) Labs Reviewed  CBC WITH DIFFERENTIAL/PLATELET - Abnormal; Notable for the following components:      Result Value   WBC 11.0 (*)    Neutro Abs 9.2 (*)    All other components within normal limits  COMPREHENSIVE METABOLIC PANEL - Abnormal; Notable for the following components:   Sodium 126 (*)    Chloride 93 (*)    Calcium 8.3 (*)    Total Protein 6.2 (*)    Albumin 3.0 (*)    All other components within normal limits  URINALYSIS, W/ REFLEX TO CULTURE (INFECTION SUSPECTED) - Abnormal; Notable for the following components:   Color, Urine STRAW (*)    Leukocytes,Ua TRACE (*)    Bacteria, UA RARE (*)    All other components within normal limits  LIPASE, BLOOD   Results for orders placed or performed during the hospital encounter of 02/03/23  CBC with  Differential  Result Value Ref Range   WBC 11.0 (H) 4.0 - 10.5 K/uL   RBC 4.24 3.87 - 5.11 MIL/uL   Hemoglobin 12.0 12.0 - 15.0 g/dL   HCT 40.9 81.1 - 91.4 %   MCV 88.2 80.0 - 100.0 fL   MCH 28.3 26.0 - 34.0 pg   MCHC 32.1 30.0 - 36.0 g/dL   RDW 78.2 95.6 - 21.3 %   Platelets 299 150 - 400 K/uL   nRBC 0.0 0.0 - 0.2 %   Neutrophils Relative % 84 %   Neutro Abs 9.2 (H) 1.7 - 7.7 K/uL  Lymphocytes Relative 6 %   Lymphs Abs 0.7 0.7 - 4.0 K/uL   Monocytes Relative 8 %   Monocytes Absolute 0.9 0.1 - 1.0 K/uL   Eosinophils Relative 2 %   Eosinophils Absolute 0.2 0.0 - 0.5 K/uL   Basophils Relative 0 %   Basophils Absolute 0.0 0.0 - 0.1 K/uL   Immature Granulocytes 0 %   Abs Immature Granulocytes 0.04 0.00 - 0.07 K/uL  Comprehensive metabolic panel  Result Value Ref Range   Sodium 126 (L) 135 - 145 mmol/L   Potassium 3.7 3.5 - 5.1 mmol/L   Chloride 93 (L) 98 - 111 mmol/L   CO2 26 22 - 32 mmol/L   Glucose, Bld 99 70 - 99 mg/dL   BUN 14 8 - 23 mg/dL   Creatinine, Ser 6.26 0.44 - 1.00 mg/dL   Calcium 8.3 (L) 8.9 - 10.3 mg/dL   Total Protein 6.2 (L) 6.5 - 8.1 g/dL   Albumin 3.0 (L) 3.5 - 5.0 g/dL   AST 21 15 - 41 U/L   ALT 25 0 - 44 U/L   Alkaline Phosphatase 111 38 - 126 U/L   Total Bilirubin 0.7 <1.2 mg/dL   GFR, Estimated >94 >85 mL/min   Anion gap 7 5 - 15  Lipase, blood  Result Value Ref Range   Lipase 28 11 - 51 U/L  Urinalysis, w/ Reflex to Culture (Infection Suspected) -Urine, Suprapubic  Result Value Ref Range   Specimen Source URINE, SUPRAPUBIC    Color, Urine STRAW (A) YELLOW   APPearance CLEAR CLEAR   Specific Gravity, Urine 1.013 1.005 - 1.030   pH 6.0 5.0 - 8.0   Glucose, UA NEGATIVE NEGATIVE mg/dL   Hgb urine dipstick NEGATIVE NEGATIVE   Bilirubin Urine NEGATIVE NEGATIVE   Ketones, ur NEGATIVE NEGATIVE mg/dL   Protein, ur NEGATIVE NEGATIVE mg/dL   Nitrite NEGATIVE NEGATIVE   Leukocytes,Ua TRACE (A) NEGATIVE   RBC / HPF 0-5 0 - 5 RBC/hpf   WBC, UA 0-5 0  - 5 WBC/hpf   Bacteria, UA RARE (A) NONE SEEN   Squamous Epithelial / HPF 0-5 0 - 5 /HPF   Mucus PRESENT     EKG None  Radiology CT ABDOMEN PELVIS W CONTRAST  Result Date: 02/03/2023 CLINICAL DATA:  Nausea, dry heaves for several days, suspected bowel obstruction EXAM: CT ABDOMEN AND PELVIS WITH CONTRAST TECHNIQUE: Multidetector CT imaging of the abdomen and pelvis was performed using the standard protocol following bolus administration of intravenous contrast. RADIATION DOSE REDUCTION: This exam was performed according to the departmental dose-optimization program which includes automated exposure control, adjustment of the mA and/or kV according to patient size and/or use of iterative reconstruction technique. CONTRAST:  OMNIPAQUE IOHEXOL 300 MG/ML  SOLN COMPARISON:  12/14/2022 FINDINGS: Lower chest: Free-flowing bilateral pleural effusions, volume estimated the 500 cc. Nonspecific nodular consolidation within the lingula, measuring up to 17 mm. This could reflect localized infection or inflammation, and follow-up is recommended to exclude pulmonary mass. Dependent consolidation within the lower lobes. Cardiomegaly, with prominent calcifications of the mitral annulus and biatrial dilatation. Hepatobiliary: No focal liver abnormality is seen. No gallstones, gallbladder wall thickening, or biliary dilatation. Pancreas: Unremarkable. No pancreatic ductal dilatation or surrounding inflammatory changes. Spleen: Normal in size without focal abnormality. Adrenals/Urinary Tract: Bilateral renal cortical cysts do not require specific imaging follow-up. No renal calculi or obstructive uropathy. The adrenals are unremarkable. Bladder is decompressed by a suprapubic catheter. There are 2 small remaining bladder calculi measuring up  to 4 mm. The remaining calculi have been evacuated in the interim. Stomach/Bowel: Postsurgical changes from partial distal colectomy and reanastomosis. No bowel obstruction or  ileus. Moderate retained stool within the colon. No bowel wall thickening or inflammatory change. Vascular/Lymphatic: Aortic atherosclerosis. No enlarged abdominal or pelvic lymph nodes. Reproductive: Uterus is atrophic.  No adnexal masses. Other: No free fluid or free intraperitoneal gas. No abdominal wall hernia. Musculoskeletal: No acute displaced fractures. Severe bilateral hip osteoarthritis again noted, right greater than left. Bilateral hip effusions are unchanged. Reconstructed images demonstrate no additional findings. IMPRESSION: 1. Bilateral pleural effusions. 2. Nodular consolidation within the lingula, which may reflect localized inflammation or infection. Follow-up imaging is recommended to document resolution and exclude parenchymal lung nodules. 3. No bowel obstruction or ileus. Moderate retained stool throughout the colon. 4. Small residual bladder calculi as above. The majority of the calculi have been evacuated since previous exam. Stable suprapubic catheter in place. 5.  Aortic Atherosclerosis (ICD10-I70.0). 6. Stable degenerative changes of the bilateral hips, right greater than left. Electronically Signed   By: Sharlet Salina M.D.   On: 02/03/2023 19:10   DG Chest Portable 1 View  Result Date: 02/03/2023 CLINICAL DATA:  Cough. Several day history of nausea and dry heaving EXAM: PORTABLE CHEST 1 VIEW COMPARISON:  Chest radiograph dated 10/30/2022 FINDINGS: Hyperinflated lungs. No focal consolidations. Mild blunting of bilateral costophrenic angles. No pneumothorax. Similar mildly enlarged cardiomediastinal silhouette. No acute osseous abnormality. IMPRESSION: 1. Hyperinflated lungs, which can be seen in the setting of COPD. 2. Mild blunting of bilateral costophrenic angles, which may represent trace pleural effusions or pleural thickening. 3. Similar mild cardiomegaly. Electronically Signed   By: Agustin Cree M.D.   On: 02/03/2023 19:06    Procedures Procedures    Medications Ordered in  ED Medications  cefTRIAXone (ROCEPHIN) 1 g in sodium chloride 0.9 % 100 mL IVPB (1 g Intravenous New Bag/Given 02/03/23 2013)  metoprolol tartrate (LOPRESSOR) injection 5 mg (has no administration in time range)  metoCLOPramide (REGLAN) injection 5 mg (5 mg Intravenous Given 02/03/23 1711)  iohexol (OMNIPAQUE) 300 MG/ML solution 100 mL (100 mLs Intravenous Contrast Given 02/03/23 1758)  morphine (PF) 2 MG/ML injection 2 mg (2 mg Intravenous Given 02/03/23 2014)  0.9 %  sodium chloride infusion ( Intravenous New Bag/Given 02/03/23 2013)    ED Course/ Medical Decision Making/ A&P Clinical Course as of 02/03/23 2016  Sat Feb 03, 2023  2014 At the time of decision to admit, the patient's heartrate increased to 160's. Patient unaware. She has been mildly hypertensive throughout ED visit. History of SVT, on Metoprolol and has taken her dose today. 5 mg IV metoprolol ordered. Patient has been accepted to the hospitalist service.  [SU]    Clinical Course User Index [SU] Elpidio Anis, PA-C                                 Medical Decision Making This patient presents to the ED for concern of nausea and dry heaves, this involves an extensive number of treatment options, and is a complaint that carries with it a high risk of complications and morbidity.  The differential diagnosis includes viral infection, SBO/LBO, constipation, bacterial infection   Co morbidities that complicate the patient evaluation  Previous abdominal surgery (appendectomy), HTN, Breast CA, OA, neurogenic bladder with chronic catheter.   Additional history obtained:   External records from outside source obtained and reviewed including:  11/14 - Dr. Lynelle Doctor, PCP - nausea x 2 weeks, dry heaves started that night. Provided Zofran without relief.  11/5 - Dr. Mena Goes (urology) - cystolitholopaxy and suprapubic tube exchange (larger caliber secondary to bladder stones with obstruction)   Lab Tests:  I Ordered, and  personally interpreted labs.  The pertinent results include:  WBC 11; urine without infection; low sodium of 126; normal renal function    Imaging Studies ordered:  I ordered imaging studies including CXR: No focal consolidations or infiltrates CT abd/pel:    IMPRESSION: 1. Bilateral pleural effusions. 2. Nodular consolidation within the lingula, which may reflect localized inflammation or infection. Follow-up imaging is recommended to document resolution and exclude parenchymal lung nodules. 3. No bowel obstruction or ileus. Moderate retained stool throughout the colon. 4. Small residual bladder calculi as above. The majority of the calculi have been evacuated since previous exam. Stable suprapubic catheter in place. 5.  Aortic Atherosclerosis (ICD10-I70.0). 6. Stable degenerative changes of the bilateral hips, right greater than left.     Cardiac Monitoring: / EKG:  The patient was maintained on a cardiac monitor.  I personally viewed and interpreted the cardiac monitored which showed an underlying rhythm of: Atrial Fibrillation, rate 90 - unchanged from previous    Problem List / ED Course / Critical interventions / Medication management  Patient with persistent cough x weeks Has nausea x 2-3 weeks, with dry heaves over the last 4 days Not eating Constipation - last BM 4-5 days ago - No CT evidence of obstruction  I ordered medication including tylenol  for knee pain (started in eD)  Reevaluation of the patient after these medicines showed that the patient stayed the same I have reviewed the patients home medicines and have made adjustments as needed   Social Determinants of Health:  Lives alone, niece nearby No transportation issues   Test / Admission - Considered:  Patient has a consolidation on CT c/w clinical picture of persistent/ongoing cough, nausea, now with dry heaves. IV Rocephin started.  She developed knee pain while in the ED - h/o restless leg  syndrome, was on gabapentin but had vivid nightmares while on this so stopped taking it. Tylenol without relief. 2 mg IV morphine provided Na 126 - gentle rehydration started Hospitalist paged for admission Patient and family updated on plan of care.     Amount and/or Complexity of Data Reviewed Labs: ordered. Radiology: ordered.  Risk Prescription drug management. Decision regarding hospitalization.           Final Clinical Impression(s) / ED Diagnoses Final diagnoses:  Community acquired pneumonia, unspecified laterality  Hyponatremia  Constipation, unspecified constipation type    Rx / DC Orders ED Discharge Orders     None         Elpidio Anis, PA-C 02/03/23 2001    Elpidio Anis, PA-C 02/03/23 2016    Loetta Rough, MD 02/04/23 [phone]

## 2023-02-03 NOTE — ED Notes (Signed)
Pt given ice chips

## 2023-02-04 ENCOUNTER — Encounter (HOSPITAL_COMMUNITY): Payer: Self-pay | Admitting: Student

## 2023-02-04 ENCOUNTER — Observation Stay (HOSPITAL_COMMUNITY): Payer: Medicare Other

## 2023-02-04 DIAGNOSIS — Z882 Allergy status to sulfonamides status: Secondary | ICD-10-CM | POA: Diagnosis not present

## 2023-02-04 DIAGNOSIS — I509 Heart failure, unspecified: Secondary | ICD-10-CM | POA: Diagnosis not present

## 2023-02-04 DIAGNOSIS — Z96651 Presence of right artificial knee joint: Secondary | ICD-10-CM | POA: Diagnosis present

## 2023-02-04 DIAGNOSIS — Z79899 Other long term (current) drug therapy: Secondary | ICD-10-CM | POA: Diagnosis not present

## 2023-02-04 DIAGNOSIS — Z66 Do not resuscitate: Secondary | ICD-10-CM | POA: Diagnosis present

## 2023-02-04 DIAGNOSIS — J9811 Atelectasis: Secondary | ICD-10-CM | POA: Diagnosis present

## 2023-02-04 DIAGNOSIS — Z823 Family history of stroke: Secondary | ICD-10-CM | POA: Diagnosis not present

## 2023-02-04 DIAGNOSIS — R059 Cough, unspecified: Secondary | ICD-10-CM | POA: Diagnosis not present

## 2023-02-04 DIAGNOSIS — Z833 Family history of diabetes mellitus: Secondary | ICD-10-CM | POA: Diagnosis not present

## 2023-02-04 DIAGNOSIS — Z800 Family history of malignant neoplasm of digestive organs: Secondary | ICD-10-CM | POA: Diagnosis not present

## 2023-02-04 DIAGNOSIS — Z883 Allergy status to other anti-infective agents status: Secondary | ICD-10-CM | POA: Diagnosis not present

## 2023-02-04 DIAGNOSIS — M810 Age-related osteoporosis without current pathological fracture: Secondary | ICD-10-CM | POA: Diagnosis present

## 2023-02-04 DIAGNOSIS — K5904 Chronic idiopathic constipation: Secondary | ICD-10-CM

## 2023-02-04 DIAGNOSIS — E871 Hypo-osmolality and hyponatremia: Secondary | ICD-10-CM | POA: Diagnosis present

## 2023-02-04 DIAGNOSIS — I5031 Acute diastolic (congestive) heart failure: Secondary | ICD-10-CM | POA: Diagnosis not present

## 2023-02-04 DIAGNOSIS — Z888 Allergy status to other drugs, medicaments and biological substances status: Secondary | ICD-10-CM | POA: Diagnosis not present

## 2023-02-04 DIAGNOSIS — I5043 Acute on chronic combined systolic (congestive) and diastolic (congestive) heart failure: Secondary | ICD-10-CM | POA: Diagnosis not present

## 2023-02-04 DIAGNOSIS — J90 Pleural effusion, not elsewhere classified: Secondary | ICD-10-CM | POA: Diagnosis not present

## 2023-02-04 DIAGNOSIS — Z85828 Personal history of other malignant neoplasm of skin: Secondary | ICD-10-CM | POA: Diagnosis not present

## 2023-02-04 DIAGNOSIS — Z880 Allergy status to penicillin: Secondary | ICD-10-CM | POA: Diagnosis not present

## 2023-02-04 DIAGNOSIS — Z881 Allergy status to other antibiotic agents status: Secondary | ICD-10-CM | POA: Diagnosis not present

## 2023-02-04 DIAGNOSIS — I4719 Other supraventricular tachycardia: Secondary | ICD-10-CM

## 2023-02-04 DIAGNOSIS — G2581 Restless legs syndrome: Secondary | ICD-10-CM | POA: Diagnosis present

## 2023-02-04 DIAGNOSIS — Z803 Family history of malignant neoplasm of breast: Secondary | ICD-10-CM | POA: Diagnosis not present

## 2023-02-04 DIAGNOSIS — I4510 Unspecified right bundle-branch block: Secondary | ICD-10-CM | POA: Diagnosis present

## 2023-02-04 DIAGNOSIS — R0602 Shortness of breath: Secondary | ICD-10-CM | POA: Diagnosis not present

## 2023-02-04 DIAGNOSIS — I110 Hypertensive heart disease with heart failure: Secondary | ICD-10-CM | POA: Diagnosis present

## 2023-02-04 DIAGNOSIS — Z8249 Family history of ischemic heart disease and other diseases of the circulatory system: Secondary | ICD-10-CM | POA: Diagnosis not present

## 2023-02-04 DIAGNOSIS — E782 Mixed hyperlipidemia: Secondary | ICD-10-CM | POA: Diagnosis present

## 2023-02-04 DIAGNOSIS — I5023 Acute on chronic systolic (congestive) heart failure: Secondary | ICD-10-CM | POA: Diagnosis present

## 2023-02-04 DIAGNOSIS — Z853 Personal history of malignant neoplasm of breast: Secondary | ICD-10-CM | POA: Diagnosis not present

## 2023-02-04 LAB — COMPREHENSIVE METABOLIC PANEL
ALT: 25 U/L (ref 0–44)
AST: 20 U/L (ref 15–41)
Albumin: 3.1 g/dL — ABNORMAL LOW (ref 3.5–5.0)
Alkaline Phosphatase: 116 U/L (ref 38–126)
Anion gap: 10 (ref 5–15)
BUN: 13 mg/dL (ref 8–23)
CO2: 30 mmol/L (ref 22–32)
Calcium: 8.8 mg/dL — ABNORMAL LOW (ref 8.9–10.3)
Chloride: 92 mmol/L — ABNORMAL LOW (ref 98–111)
Creatinine, Ser: 0.68 mg/dL (ref 0.44–1.00)
GFR, Estimated: >60 mL/min (ref >60)
Glucose, Bld: 96 mg/dL (ref 70–99)
Potassium: 3.6 mmol/L (ref 3.5–5.1)
Sodium: 132 mmol/L — ABNORMAL LOW (ref 135–145)
Total Bilirubin: 0.7 mg/dL (ref <1.2)
Total Protein: 6.6 g/dL (ref 6.5–8.1)

## 2023-02-04 LAB — CBC
HCT: 36.9 % (ref 36.0–46.0)
Hemoglobin: 11.9 g/dL — ABNORMAL LOW (ref 12.0–15.0)
MCH: 28.6 pg (ref 26.0–34.0)
MCHC: 32.2 g/dL (ref 30.0–36.0)
MCV: 88.7 fL (ref 80.0–100.0)
Platelets: 308 10*3/uL (ref 150–400)
RBC: 4.16 MIL/uL (ref 3.87–5.11)
RDW: 15.1 % (ref 11.5–15.5)
WBC: 9.9 10*3/uL (ref 4.0–10.5)
nRBC: 0 % (ref 0.0–0.2)

## 2023-02-04 LAB — ECHOCARDIOGRAM COMPLETE
AR max vel: 1.53 cm2
AV Area VTI: 1.55 cm2
AV Area mean vel: 1.41 cm2
AV Mean grad: 3 mm[Hg]
AV Peak grad: 6.2 mm[Hg]
Ao pk vel: 1.24 m/s
Area-P 1/2: 3.02 cm2
Calc EF: 56.4 %
Height: 63 in
S' Lateral: 3.4 cm
Single Plane A2C EF: 58.8 %
Single Plane A4C EF: 54.1 %
Weight: 1890.66 [oz_av]

## 2023-02-04 MED ORDER — SACUBITRIL-VALSARTAN 24-26 MG PO TABS
1.0000 | ORAL_TABLET | Freq: Two times a day (BID) | ORAL | Status: DC
  Administered 2023-02-04 – 2023-02-07 (×7): 1 via ORAL
  Filled 2023-02-04 (×8): qty 1

## 2023-02-04 MED ORDER — CHLORHEXIDINE GLUCONATE CLOTH 2 % EX PADS
6.0000 | MEDICATED_PAD | Freq: Every day | CUTANEOUS | Status: DC
  Administered 2023-02-05: 6 via TOPICAL

## 2023-02-04 MED ORDER — MELATONIN 5 MG PO TABS
5.0000 mg | ORAL_TABLET | Freq: Every evening | ORAL | Status: AC | PRN
  Administered 2023-02-04 – 2023-02-07 (×3): 5 mg via ORAL
  Filled 2023-02-04 (×3): qty 1

## 2023-02-04 MED ORDER — FUROSEMIDE 10 MG/ML IJ SOLN
20.0000 mg | Freq: Two times a day (BID) | INTRAMUSCULAR | Status: DC
  Administered 2023-02-04 – 2023-02-05 (×2): 20 mg via INTRAVENOUS
  Filled 2023-02-04 (×2): qty 2

## 2023-02-04 MED ORDER — ORAL CARE MOUTH RINSE: 15.0000 mL | OROMUCOSAL | Status: DC | PRN

## 2023-02-04 NOTE — Evaluation (Addendum)
Physical Therapy Evaluation Patient Details Name: Mary [name] MRN: [id number] DOB: February 28, 1927 Today's Date: 02/04/2023  History of Present Illness  89 y.o. female with presented to the hospital with 2 to 3-week history of worsening dry cough shortness of breath and nausea and admitted for acute on chronic systolic heart failure exacerbation.  History of admission in August 2024 for systolic heart failure with bilateral pleural effusion. Past medical history significant of systolic heart failure (EF 45%), paroxysmal atrial tachycardia, HTN, history of invasive breast carcinoma s/p right total mastectomy (2007), chronic R hip/groin pain, suprapubic catheter  Clinical Impression  Pt admitted with above diagnosis.  Pt currently with functional limitations due to the deficits listed below (see PT Problem List). Pt will benefit from acute skilled PT to increase their independence and safety with mobility to allow discharge.  Pt agreeable to attempt ambulation however significantly limited by left leg pain today.  Pt does endorse chronic right hip/groin pain however pain now in left leg (requesting pain meds and RN informed).  Pt reports hx of OA.  Pt does not wear oxygen at baseline and SPO2 dropped to 83% on room air with attempt at ambulation so 2L O2 [place] reapplied (?accuracy as pt has very cold hands). Pt from home alone with 24/7 caregivers. Daughter present and reports pt typically returns home with HHPT after admissions however states she has had a few this year and apparently just finished a course of HHPT after most recent admission.         If plan is discharge home, recommend the following: A little help with walking and/or transfers;A little help with bathing/dressing/bathroom;Help with stairs or ramp for entrance   Can travel by private vehicle        Equipment Recommendations Other (comment) (shower chair)  Recommendations for Other Services       Functional Status Assessment Patient  has had a recent decline in their functional status and demonstrates the ability to make significant improvements in function in a reasonable and predictable amount of time.     Precautions / Restrictions Precautions Precautions: Fall Precaution Comments: monitor sats Restrictions Weight Bearing Restrictions: No      Mobility  Bed Mobility               General bed mobility comments: pt was received seated in chair    Transfers Overall transfer level: Needs assistance Equipment used: Rolling walker (2 wheels) Transfers: Sit to/from Stand Sit to Stand: Min assist           General transfer comment: cues for hand placement, assist to rise and control descent    Ambulation/Gait Ambulation/Gait assistance: Min assist Gait Distance (Feet): 4 Feet Assistive device: Rolling walker (2 wheels) Gait Pattern/deviations: Step-to pattern, Decreased stance time - left, Antalgic       General Gait Details: pt limited by significant pain in left leg, only able to take a few steps forwards and backwards; pt denies dyspnea however SPO2 reading 83% on room air so reapplied 2L O2 [place] (per telemetry HR in afib and reading 80-90s during session)  Stairs            Wheelchair Mobility     Tilt Bed    Modified Rankin (Stroke Patients Only)       Balance Overall balance assessment: History of Falls         Standing balance support: Bilateral upper extremity supported, Reliant on assistive device for balance, During functional activity Standing balance-Leahy Scale: Poor  Pertinent Vitals/Pain Pain Assessment Pain Assessment: Faces Faces Pain Scale: Hurts whole lot Pain Location: left leg - mid thigh and below Pain Descriptors / Indicators: Sore, Grimacing, Discomfort, Aching Pain Intervention(s): Repositioned, Monitored during session, Patient requesting pain meds-RN notified    Home Living Family/patient expects to be  discharged to:: Private residence Living Arrangements: Alone Available Help at Discharge: Personal care attendant Type of Home: House Home Access: Stairs to enter Entrance Stairs-Rails: Doctor, general practice of Steps: 3-4   Home Layout: Able to live on main level with bedroom/bathroom Home Equipment: Agricultural consultant (2 wheels);Cane - single point;Grab bars - tub/shower;Transport chair Additional Comments: She has 24/7 caregivers.    Prior Function Prior Level of Function : Needs assist             Mobility Comments: uses RW for ambulation, sometimes needs a boost out of chair, caregivers typically present for mobility and ADLs ADLs Comments: PCA several hours throughout the day, family and caregiver reports pt is not doing well with wanting to bath but they believe this is due to fear of falling     Extremity/Trunk Assessment        Lower Extremity Assessment Lower Extremity Assessment: Generalized weakness;LLE deficits/detail LLE: Unable to fully assess due to pain       Communication   Communication Communication: Hearing impairment  Cognition Arousal: Alert Behavior During Therapy: WFL for tasks assessed/performed Overall Cognitive Status: Within Functional Limits for tasks assessed                                          General Comments      Exercises     Assessment/Plan    PT Assessment Patient needs continued PT services  PT Problem List Decreased strength;Cardiopulmonary status limiting activity;Decreased activity tolerance;Decreased mobility;Decreased balance;Decreased knowledge of use of DME;Pain       PT Treatment Interventions DME instruction;Gait training;Balance training;Stair training;Functional mobility training;Therapeutic activities;Therapeutic exercise;Patient/family education    PT Goals (Current goals can be found in the Care Plan section)  Acute Rehab PT Goals PT Goal Formulation: With patient/family Time  For Goal Achievement: 02/18/23 Potential to Achieve Goals: Good    Frequency Min 1X/week     Co-evaluation               AM-PAC PT "6 Clicks" Mobility  Outcome Measure Help needed turning from your back to your side while in a flat bed without using bedrails?: A Lot Help needed moving from lying on your back to sitting on the side of a flat bed without using bedrails?: A Little Help needed moving to and from a bed to a chair (including a wheelchair)?: A Little Help needed standing up from a chair using your arms (e.g., wheelchair or bedside chair)?: A Little Help needed to walk in hospital room?: A Little Help needed climbing 3-5 steps with a railing? : A Lot 6 Click Score: 16    End of Session Equipment Utilized During Treatment: Gait belt Activity Tolerance: Patient limited by pain Patient left: in chair;with call bell/phone within reach;with family/visitor present;with chair alarm set Nurse Communication: Mobility status PT Visit Diagnosis: Difficulty in walking, not elsewhere classified (R26.2)    Time: 1191-4782 PT Time Calculation (min) (ACUTE ONLY): 30 min   Charges:   PT Evaluation $PT Eval Low Complexity: 1 Low PT Treatments $Gait Training: 8-22 mins PT General Charges $$  ACUTE PT VISIT: 1 Visit        Thomasene Mohair PT, DPT Physical Therapist Acute Rehabilitation Services Office: [phone]   Janan Halter [place] 02/04/2023, 4:09 PM

## 2023-02-04 NOTE — Plan of Care (Signed)
  Problem: Education: Goal: Knowledge of General Education information will improve Description Including pain rating scale, medication(s)/side effects and non-pharmacologic comfort measures Outcome: Progressing   Problem: Clinical Measurements: Goal: Will remain free from infection Outcome: Progressing Goal: Diagnostic test results will improve Outcome: Progressing   Problem: Coping: Goal: Level of anxiety will decrease Outcome: Progressing   Problem: Safety: Goal: Ability to remain free from injury will improve Outcome: Progressing

## 2023-02-04 NOTE — Plan of Care (Signed)
  Problem: Clinical Measurements: Goal: Cardiovascular complication will be avoided Outcome: Progressing   Problem: Activity: Goal: Risk for activity intolerance will decrease Outcome: Progressing   Problem: Nutrition: Goal: Adequate nutrition will be maintained Outcome: Progressing   Problem: Elimination: Goal: Will not experience complications related to urinary retention Outcome: Progressing   Problem: Pain Management: Goal: General experience of comfort will improve Outcome: Progressing

## 2023-02-04 NOTE — Progress Notes (Signed)
MEWS yellow this AM d/t HTN. Scheduled antihypertensives given by this RN as ordered. Dr Tyson Babinski and charge RN notified by this RN. Standing yellow MEWS orders implemented per protocol. This RN will continue to carefully monitor pt for ongoing HTN.

## 2023-02-04 NOTE — Progress Notes (Signed)
PROGRESS NOTE    Beverlie Crest  WUJ:[id number] DOB: 25-Aug-1926 DOA: 02/03/2023 PCP: Joselyn Arrow, MD    Brief Narrative:   Mary [name] is a 89 y.o. female with medical history significant of systolic heart failure (EF 45%), paroxysmal atrial tachycardia, HTN, history of invasive breast carcinoma s/p right total mastectomy (2007), presented to the hospital with 2 to 3-week history of worsening dry cough shortness of breath and nausea.  History of admission in August 2024 for systolic heart failure with bilateral pleural effusion.  Patient subsequently followed Dr. Allyson Sabal with [facility] cardiology and was placed on metoprolol succinate, spironolactone, losartan, and advised to take Lasix 10 mg 3 times a week.  Patient lives alone but does have assistance at home. her dry weight is about 116 pounds.  In the ED patient had mild leukocytosis and BMP showed mild hyponatremia.  Chest x-ray showed bilateral pleural effusion.  CT abdomen pelvis showing bilateral pleural effusions along with a nodular consolidation within the lingula.  Patient was given Rocephin and patient was admitted hospital for further evaluation and treatment.   Assessment and plan.  Acute on chronic systolic heart failure exacerbation Review of previous 2D echocardiogram from 10/31/2022 showed  LVEF 45% with mildly decreased LV function, severely dilated left atrium, severe mitral annular calcification.  Patient is on metoprolol succinate 25 mg daily, spironolactone 12.5 mg daily, losartan 12.5 mg daily.  She is also on Lasix 10 mg 3 times a week but unclear on consistency of use.  Imaging shows effusion with lower extremity edema.   BNP elevated.   TSH at 2.6.  Continue metoprolol spironolactone losartan.  Received 1 dose of IV Lasix. Continue daily weights,Strict I/O's.PT/OT eval.  Seen by cardiology and on IV Lasix 20 twice daily.   Lingular opacity? Nodular consolidation within the lingula noted on CT abdomen pelvis.  Mild leukocytosis with  cough and shortness of breath..  No fever and leukocytosis has improved.  Received 1 dose of Rocephin.  Hold off with further antibiotic for now.  Low threshold to start antibiotic if signs of infection.    Moderate Hyponatremia Likely secondary to hypervolemic hyponatremia.  Continue with diuretics.  Check BMP in AM.   Atrial premature complexes Paroxysmal atrial tachycardia RBBB Patient with known history of RBBB and paroxysmal atrial tachycardia.  Had episode of of SVT.  Continue metoprolol.    Constipation Noted on CT scan.  Continue MiraLAX and Senokot   Hypertension -Continue home Toprol-XL, losartan, spironolactone    OA of right knee Continue Voltaren gel.   Restless Leg Syndrome Off gabapentin due to nightmares.   History of invasive mammary carcinoma Status post total right mastectomy in 02/2006.  Deconditioning debility.  Will get PT OT evaluation.  Might need rehabilitation placement.      DVT prophylaxis: enoxaparin (LOVENOX) injection 40 mg Start: 02/03/23 2200   Code Status:     Code Status: Limited: Do not attempt resuscitation (DNR) -DNR-LIMITED -Do Not Intubate/DNI   Disposition: Uncertain at this time we will get PT OT evaluation. Status is: Inpatient Remains inpatient appropriate because: Decompensated heart failure   Family Communication: Spoke with the patient's daughter at bedside.  Consultants:  Cardiology  Procedures:  None  Antimicrobials:  Rocephin IV x 1  Anti-infectives (From admission, onward)    Start     Dose/Rate Route Frequency Ordered Stop   02/03/23 2000  cefTRIAXone (ROCEPHIN) 1 g in sodium chloride 0.9 % 100 mL IVPB        1 g  200 mL/hr over 30 Minutes Intravenous  Once 02/03/23 1953 02/03/23 2043       Subjective: Today, patient was seen and examined at bedside.  Patient states hacking cough.  Shortness of breath persist but slightly better.  Denies any fever, chills or rigor.  Denies any chest  pain.  Objective: Vitals:   02/04/23 0911 02/04/23 0913 02/04/23 1058 02/04/23 1339  BP: (!) 154/101 (!) 154/101 112/66 (!) 165/72  Pulse: 72 72 73 81  Resp: (!) 29 (!) 29 (!) 24 (!) 24  Temp: 98.8 F (37.1 C) 98.8 F (37.1 C) 97.9 F (36.6 C) 98.2 F (36.8 C)  TempSrc: Oral  Axillary Oral  SpO2: 96% 96% 98% 93%  Weight:      Height:        Intake/Output Summary (Last 24 hours) at 02/04/2023 1500 Last data filed at 02/04/2023 1102 Gross per 24 hour  Intake 480 ml  Output 2200 ml  Net -1720 ml   Filed Weights   02/03/23 1626 02/04/23 0500  Weight: 54 kg 53.6 kg    Physical Examination: Body mass index is 20.93 kg/m.   General:  Average built, not in obvious distress, on nasal cannula oxygen, elderly female HENT:   No scleral pallor or icterus noted. Oral mucosa is moist.  Chest:  Diminished breath sounds bilaterally.  CVS: S1 &S2 heard. No murmur.  Regular rate and rhythm. Abdomen: Soft, nontender, nondistended.  Bowel sounds are heard.   Extremities: No cyanosis, clubbing or edema.  Peripheral pulses are palpable.  Bilateral feet with erythema warm to palpation with distal capillary refill and pulses Psych: Alert, awake and Communicative, CNS:  No cranial nerve deficits.  Generalized weakness noted, moves all extremities Skin: Warm and dry.    Data Reviewed:   CBC: Recent Labs  Lab 02/03/23 1656 02/04/23 0450  WBC 11.0* 9.9  NEUTROABS 9.2*  --   HGB 12.0 11.9*  HCT 37.4 36.9  MCV 88.2 88.7  PLT 299 308    Basic Metabolic Panel: Recent Labs  Lab 02/03/23 1656 02/03/23 2244 02/04/23 0450  NA 126* 130* 132*  K 3.7  --  3.6  CL 93*  --  92*  CO2 26  --  30  GLUCOSE 99  --  96  BUN 14  --  13  CREATININE 0.55  --  0.68  CALCIUM 8.3*  --  8.8*    Liver Function Tests: Recent Labs  Lab 02/03/23 1656 02/04/23 0450  AST 21 20  ALT 25 25  ALKPHOS 111 116  BILITOT 0.7 0.7  PROT 6.2* 6.6  ALBUMIN 3.0* 3.1*     Radiology  Studies: ECHOCARDIOGRAM COMPLETE  Result Date: 02/04/2023    ECHOCARDIOGRAM REPORT   Patient Name:   DARRION CANCHOLA Date of Exam: 02/04/2023 Medical Rec #:  [id number]   Height:       63.0 in Accession #:    [id number]  Weight:       118.2 lb Date of Birth:  1926/10/17  BSA:          1.546 m Patient Age:    89 years    BP:           153/91 mmHg Patient Gender: F           HR:           86 bpm. Exam Location:  Inpatient Procedure: 2D Echo, Cardiac Doppler and Color Doppler Indications:    CHF, acute diastolic  History:  Patient has prior history of Echocardiogram examinations, most                 recent 10/31/2022. Risk Factors:Hypertension and Dyslipidemia.  Sonographer:    Karma Ganja Referring Phys: [id number] Briscoe Burns  Sonographer Comments: Technically challenging study due to limited acoustic windows. Image acquisition challenging due to uncooperative patient. IMPRESSIONS  1. Left ventricular ejection fraction, by estimation, is 45 to 50%. The left ventricle has mildly decreased function. The left ventricle demonstrates global hypokinesis. Left ventricular diastolic function could not be evaluated.  2. Right ventricular systolic function is normal. The right ventricular size is not well visualized. There is mildly elevated pulmonary artery systolic pressure.  3. Left atrial size was moderately dilated.  4. The mitral valve is degenerative. Trivial mitral valve regurgitation. No evidence of mitral stenosis. Severe mitral annular calcification.  5. The aortic valve is tricuspid. There is mild calcification of the aortic valve. There is mild thickening of the aortic valve. Aortic valve regurgitation is not visualized. Aortic valve sclerosis/calcification is present, without any evidence of aortic stenosis. Comparison(s): No significant change from prior study. FINDINGS  Left Ventricle: Left ventricular ejection fraction, by estimation, is 45 to 50%. The left ventricle has mildly decreased function. The  left ventricle demonstrates global hypokinesis. The left ventricular internal cavity size was normal in size. There is  no left ventricular hypertrophy. Left ventricular diastolic function could not be evaluated due to mitral annular calcification (moderate or greater). Left ventricular diastolic function could not be evaluated. Right Ventricle: The right ventricular size is not well visualized. Right vetricular wall thickness was not well visualized. Right ventricular systolic function is normal. There is mildly elevated pulmonary artery systolic pressure. The tricuspid regurgitant velocity is 2.95 m/s, and with an assumed right atrial pressure of 3 mmHg, the estimated right ventricular systolic pressure is 37.8 mmHg. Left Atrium: Left atrial size was moderately dilated. Right Atrium: Right atrial size was normal in size. Pericardium: There is no evidence of pericardial effusion. Mitral Valve: The mitral valve is degenerative in appearance. There is moderate thickening of the mitral valve leaflet(s). There is moderate calcification of the mitral valve leaflet(s). Severe mitral annular calcification. Trivial mitral valve regurgitation. No evidence of mitral valve stenosis. Tricuspid Valve: The tricuspid valve is normal in structure. Tricuspid valve regurgitation is trivial. No evidence of tricuspid stenosis. Aortic Valve: The aortic valve is tricuspid. There is mild calcification of the aortic valve. There is mild thickening of the aortic valve. Aortic valve regurgitation is not visualized. Aortic valve sclerosis/calcification is present, without any evidence of aortic stenosis. Aortic valve mean gradient measures 3.0 mmHg. Aortic valve peak gradient measures 6.2 mmHg. Aortic valve area, by VTI measures 1.55 cm. Pulmonic Valve: The pulmonic valve was not well visualized. Pulmonic valve regurgitation is trivial. Aorta: The aortic root and ascending aorta are structurally normal, with no evidence of dilitation.  IAS/Shunts: The interatrial septum was not well visualized. Additional Comments: There is a small pleural effusion in the left lateral region.  LEFT VENTRICLE PLAX 2D LVIDd:         4.50 cm     Diastology LVIDs:         3.40 cm     LV e' medial:    3.37 cm/s LV PW:         1.10 cm     LV E/e' medial:  21.8 LV IVS:        0.90 cm     LV  e' lateral:   8.70 cm/s LVOT diam:     1.80 cm     LV E/e' lateral: 8.4 LV SV:         32 LV SV Index:   21 LVOT Area:     2.54 cm  LV Volumes (MOD) LV vol d, MOD A2C: 69.9 ml LV vol d, MOD A4C: 64.7 ml LV vol s, MOD A2C: 28.8 ml LV vol s, MOD A4C: 29.7 ml LV SV MOD A2C:     41.1 ml LV SV MOD A4C:     64.7 ml LV SV MOD BP:      38.3 ml RIGHT VENTRICLE RV Basal diam:  4.40 cm RV S prime:     8.16 cm/s TAPSE (M-mode): 2.4 cm LEFT ATRIUM           Index        RIGHT ATRIUM           Index LA diam:      3.20 cm 2.07 cm/m   RA Area:     14.50 cm LA Vol (A2C): 37.6 ml 24.32 ml/m  RA Volume:   35.60 ml  23.03 ml/m LA Vol (A4C): 47.9 ml 30.98 ml/m  AORTIC VALVE AV Area (Vmax):    1.53 cm AV Area (Vmean):   1.41 cm AV Area (VTI):     1.55 cm AV Vmax:           124.00 cm/s AV Vmean:          82.800 cm/s AV VTI:            0.208 m AV Peak Grad:      6.2 mmHg AV Mean Grad:      3.0 mmHg LVOT Vmax:         74.60 cm/s LVOT Vmean:        45.800 cm/s LVOT VTI:          0.127 m LVOT/AV VTI ratio: 0.61  AORTA Ao Root diam: 2.80 cm Ao Asc diam:  2.90 cm MITRAL VALVE               TRICUSPID VALVE MV Area (PHT): 3.02 cm    TR Peak grad:   34.8 mmHg MV Decel Time: 251 msec    TR Vmax:        295.00 cm/s MV E velocity: 73.30 cm/s MV A velocity: 81.80 cm/s  SHUNTS MV E/A ratio:  0.90        Systemic VTI:  0.13 m                            Systemic Diam: 1.80 cm Jodelle Red MD Electronically signed by Jodelle Red MD Signature Date/Time: 02/04/2023/2:19:56 PM    Final    CT ABDOMEN PELVIS W CONTRAST  Result Date: 02/03/2023 CLINICAL DATA:  Nausea, dry heaves for several days,  suspected bowel obstruction EXAM: CT ABDOMEN AND PELVIS WITH CONTRAST TECHNIQUE: Multidetector CT imaging of the abdomen and pelvis was performed using the standard protocol following bolus administration of intravenous contrast. RADIATION DOSE REDUCTION: This exam was performed according to the departmental dose-optimization program which includes automated exposure control, adjustment of the mA and/or kV according to patient size and/or use of iterative reconstruction technique. CONTRAST:  OMNIPAQUE IOHEXOL 300 MG/ML  SOLN COMPARISON:  12/14/2022 FINDINGS: Lower chest: Free-flowing bilateral pleural effusions, volume estimated the 500 cc. Nonspecific nodular consolidation within the lingula, measuring up to  17 mm. This could reflect localized infection or inflammation, and follow-up is recommended to exclude pulmonary mass. Dependent consolidation within the lower lobes. Cardiomegaly, with prominent calcifications of the mitral annulus and biatrial dilatation. Hepatobiliary: No focal liver abnormality is seen. No gallstones, gallbladder wall thickening, or biliary dilatation. Pancreas: Unremarkable. No pancreatic ductal dilatation or surrounding inflammatory changes. Spleen: Normal in size without focal abnormality. Adrenals/Urinary Tract: Bilateral renal cortical cysts do not require specific imaging follow-up. No renal calculi or obstructive uropathy. The adrenals are unremarkable. Bladder is decompressed by a suprapubic catheter. There are 2 small remaining bladder calculi measuring up to 4 mm. The remaining calculi have been evacuated in the interim. Stomach/Bowel: Postsurgical changes from partial distal colectomy and reanastomosis. No bowel obstruction or ileus. Moderate retained stool within the colon. No bowel wall thickening or inflammatory change. Vascular/Lymphatic: Aortic atherosclerosis. No enlarged abdominal or pelvic lymph nodes. Reproductive: Uterus is atrophic.  No adnexal masses. Other: No  free fluid or free intraperitoneal gas. No abdominal wall hernia. Musculoskeletal: No acute displaced fractures. Severe bilateral hip osteoarthritis again noted, right greater than left. Bilateral hip effusions are unchanged. Reconstructed images demonstrate no additional findings. IMPRESSION: 1. Bilateral pleural effusions. 2. Nodular consolidation within the lingula, which may reflect localized inflammation or infection. Follow-up imaging is recommended to document resolution and exclude parenchymal lung nodules. 3. No bowel obstruction or ileus. Moderate retained stool throughout the colon. 4. Small residual bladder calculi as above. The majority of the calculi have been evacuated since previous exam. Stable suprapubic catheter in place. 5.  Aortic Atherosclerosis (ICD10-I70.0). 6. Stable degenerative changes of the bilateral hips, right greater than left. Electronically Signed   By: Sharlet Salina M.D.   On: 02/03/2023 19:10   DG Chest Portable 1 View  Result Date: 02/03/2023 CLINICAL DATA:  Cough. Several day history of nausea and dry heaving EXAM: PORTABLE CHEST 1 VIEW COMPARISON:  Chest radiograph dated 10/30/2022 FINDINGS: Hyperinflated lungs. No focal consolidations. Mild blunting of bilateral costophrenic angles. No pneumothorax. Similar mildly enlarged cardiomediastinal silhouette. No acute osseous abnormality. IMPRESSION: 1. Hyperinflated lungs, which can be seen in the setting of COPD. 2. Mild blunting of bilateral costophrenic angles, which may represent trace pleural effusions or pleural thickening. 3. Similar mild cardiomegaly. Electronically Signed   By: Agustin Cree M.D.   On: 02/03/2023 19:06      LOS: 0 days    Joycelyn Das, MD [facility] Available via Epic secure chat 7am-7pm After these hours, please refer to coverage provider listed on [email or web address] 02/04/2023, 3:00 PM

## 2023-02-04 NOTE — Consult Note (Signed)
Cardiology Consult:   Patient ID: Mary [name]; MRN: [id number]; DOB: [birth year]   Admission date: 02/03/2023  Primary Care Provider: Joselyn Arrow, MD Primary Cardiologist: Dr. Allyson Sabal Chief Complaint:  Shortness of breath  Patient Profile:   Mary [name] is a 89 y.o. female with SOB and chronic cough concerning with acute on chronic systolic heart failure  History of Present Illness:   [name] is  a 89 year old individual with a history of heart failure with mildly reduced ejection fraction (EF 45%), paroxysmal atrial tachycardia, hypertension, presents with a dry cough, shortness of breath, and nausea. The symptoms are similar to those experienced during a previous admission for new onset systolic heart failure. The patient reports that the dry cough and shortness of breath are exacerbated by physical activity, but improve with rest. The cough is described as severe and dry, with no expectoration. There is no reported chest pain or noticeable rapid heartbeats.  The patient's symptoms have persisted since a minor urological surgery (stones) performed two weeks prior to the current consultation. The patient has been on losartan and Lasix, taken three times a week, but the symptoms have not improved significantly. The patient's blood pressure has been notably high.  The patient has been on oxygen therapy intermittently during the current hospital stay, but does not require oxygen at home. The patient reports needing to sit up more due to the cough and shortness of breath, and has not been able to lay flat without discomfort.  She notes that she lives at home at baseline with support.  She was previously on lasix daily and was not sure why one of her doctors decreased her dose.  Allergies:    Allergies  Allergen Reactions   Gabapentin Other (See Comments)    Nightmares    Arimidex [Anastrozole] Rash   Avelox [Moxifloxacin Hcl In Nacl] Nausea Only and Rash   Azithromycin Nausea Only and  Rash   Bactrim Nausea Only and Rash   Biaxin [Clarithromycin] Nausea Only and Rash   Nitrofurantoin Nausea Only, Rash and Other (See Comments)   Penicillins Nausea Only and Rash   Sulfa Antibiotics Nausea And Vomiting, Rash and Other (See Comments)    GI intolerance   Sulfur Nausea And Vomiting and Rash    Social History:   Social History   Socioeconomic History   Marital status: Widowed    Spouse name: Not on file   Number of children: 3   Years of education: Not on file   Highest education level: Not on file  Occupational History   Not on file  Tobacco Use   Smoking status: Never   Smokeless tobacco: Never  Vaping Use   Vaping status: Never Used  Substance and Sexual Activity   Alcohol use: Never   Drug use: No   Sexual activity: Not Currently    Birth control/protection: Post-menopausal  Other Topics Concern   Not on file  Social History Narrative   Lives alone, no pets.   Children live in [place] and [place]. She has 1 son and 2 daughters; nephew lives in [place].   Widowed February 20, 1993   Her brother passed away (had moved to [place] same time she did)      She no longer drives.   Has caregiver Zella Ball during the week (mornings, sometimes until 4), somebody on Saturday and 02/21/2023 evenings (sometimes).      Updated 07/2026   Social Determinants of Health   Financial Resource Strain: Not on file  Food Insecurity:  No Food Insecurity (02/04/2023)   Hunger Vital Sign    Worried About Running Out of Food in the Last Year: Never true    Ran Out of Food in the Last Year: Never true  Transportation Needs: No Transportation Needs (02/04/2023)   PRAPARE - Administrator, Civil Service (Medical): No    Lack of Transportation (Non-Medical): No  Physical Activity: Not on file  Stress: Not on file  Social Connections: Not on file  Intimate Partner Violence: Not At Risk (02/04/2023)   Humiliation, Afraid, Rape, and Kick questionnaire    Fear of Current or Ex-Partner: No     Emotionally Abused: No    Physically Abused: No    Sexually Abused: No    Family History:   The patient's family history includes Atrial fibrillation in her brother; Breast cancer in her cousin and paternal aunt; Colon cancer in her cousin; Diabetes in her father; Hyperlipidemia in her brother and son; Hypertension in her mother; Stroke in her father.    ROS:  Please see the history of present illness.   Physical Exam/Data:   Vitals:   02/04/23 0500 02/04/23 0615 02/04/23 0700 02/04/23 0911  BP:  (!) 153/91  (!) 154/101  Pulse:  84  72  Resp:  (!) 21 (!) 22 (!) 29  Temp:  (!) 97.5 F (36.4 C)  98.8 F (37.1 C)  TempSrc:  Oral  Oral  SpO2:  100%  96%  Weight: 53.6 kg     Height:        Intake/Output Summary (Last 24 hours) at 02/04/2023 0924 Last data filed at 02/03/2023 2300 Gross per 24 hour  Intake 240 ml  Output 2000 ml  Net -1760 ml   Filed Weights   02/03/23 1626 02/04/23 0500  Weight: 54 kg 53.6 kg   Body mass index is 20.93 kg/m.   Gen: no distress elderly female   Neck: No JVD Cardiac: No Rubs or Gallops, no murmur, IRIR Respiratory: Clear to auscultation bilaterally, normal effort, normal  respiratory rate GI: Soft, nontender, non-distended  MS: No  edema;  moves all extremities Integument: Skin feels warm Neuro:  At time of evaluation, alert and oriented to person/place/time/situation  Psych: Normal affect, patient feels well  EKG:  The ECG that was done  was personally reviewed and demonstrates SR with PACs, RBBB, and significant artifact  Relevant CV Studies:  Cardiac Studies & Procedures       ECHOCARDIOGRAM  ECHOCARDIOGRAM COMPLETE 10/31/2022  Narrative ECHOCARDIOGRAM REPORT    Patient Name:   Mary [name] Date of Exam: 10/31/2022 Medical Rec #:  [id number]   Height:       62.0 in Accession #:    [id number]  Weight:       122.8 lb Date of Birth:  01/21/1927  BSA:          1.554 m Patient Age:    89 years    BP:           139/92  mmHg Patient Gender: F           HR:           60 bpm. Exam Location:  Inpatient  Procedure: 2D Echo, Cardiac Doppler and Color Doppler  Indications:    Congestive Heart Failure  History:        Patient has no prior history of Echocardiogram examinations. CHF, Signs/Symptoms:Dyspnea, Shortness of Breath and Edema; Risk Factors:Dyslipidemia and Hypertension. Hx cancer.  Sonographer:  Wallie Char Referring Phys: [id number] ALEXIS HUGELMEYER   Sonographer Comments: Image acquisition challenging due to uncooperative patient. Limited interrogation of valves due to pt inability to tolerate exam and being touched IMPRESSIONS   1. Left ventricular ejection fraction, by estimation, is 45%. The left ventricle has mildly decreased function. The left ventricle has no regional wall motion abnormalities. Left ventricular diastolic parameters are indeterminate. 2. Right ventricular systolic function is normal. The right ventricular size is moderately enlarged. 3. Left atrial size was severely dilated. 4. The mitral valve is degenerative. No evidence of mitral valve regurgitation. Severe mitral annular calcification. 5. The aortic valve was not well visualized. Aortic valve regurgitation is not visualized. 6. The inferior vena cava is dilated in size with <50% respiratory variability, suggesting right atrial pressure of 15 mmHg.Full valve assessment limited by patients discomfort.  Comparison(s): No prior Echocardiogram.  FINDINGS Left Ventricle: Left ventricular ejection fraction, by estimation, is 45%. The left ventricle has mildly decreased function. The left ventricle has no regional wall motion abnormalities. The left ventricular internal cavity size was normal in size. There is no left ventricular hypertrophy. Left ventricular diastolic parameters are indeterminate.  Right Ventricle: The right ventricular size is moderately enlarged. No increase in right ventricular wall thickness. Right  ventricular systolic function is normal.  Left Atrium: Left atrial size was severely dilated.  Right Atrium: Right atrial size was normal in size.  Pericardium: There is no evidence of pericardial effusion.  Mitral Valve: The mitral valve is degenerative in appearance. Severe mitral annular calcification. No evidence of mitral valve regurgitation. MV peak gradient, 4.2 mmHg. The mean mitral valve gradient is 1.0 mmHg.  Tricuspid Valve: The tricuspid valve is normal in structure. Tricuspid valve regurgitation is mild . No evidence of tricuspid stenosis.  Aortic Valve: The aortic valve was not well visualized. Aortic valve regurgitation is not visualized. Aortic valve mean gradient measures 3.0 mmHg. Aortic valve peak gradient measures 4.9 mmHg. Aortic valve area, by VTI measures 2.25 cm.  Pulmonic Valve: The pulmonic valve was not well visualized. Pulmonic valve regurgitation is not visualized.  Aorta: The aortic root and ascending aorta are structurally normal, with no evidence of dilitation.  Venous: The inferior vena cava is dilated in size with less than 50% respiratory variability, suggesting right atrial pressure of 15 mmHg.  IAS/Shunts: The interatrial septum was not well visualized.   LEFT VENTRICLE PLAX 2D LVIDd:         4.80 cm     Diastology LVIDs:         3.60 cm     LV e' medial:    4.79 cm/s LV PW:         1.00 cm     LV E/e' medial:  21.7 LV IVS:        0.90 cm     LV e' lateral:   5.89 cm/s LVOT diam:     1.90 cm     LV E/e' lateral: 17.7 LV SV:         48 LV SV Index:   31 LVOT Area:     2.84 cm  LV Volumes (MOD) LV vol d, MOD A2C: 72.3 ml LV vol d, MOD A4C: 68.5 ml LV vol s, MOD A2C: 42.9 ml LV vol s, MOD A4C: 39.6 ml LV SV MOD A2C:     29.4 ml LV SV MOD A4C:     68.5 ml LV SV MOD BP:      29.8 ml  RIGHT  VENTRICLE            IVC RV Basal diam:  3.60 cm    IVC diam: 3.10 cm RV S prime:     7.34 cm/s TAPSE (M-mode): 1.4 cm  LEFT ATRIUM              Index        RIGHT ATRIUM           Index LA diam:        3.70 cm 2.38 cm/m   RA Area:     14.10 cm LA Vol (A2C):   85.8 ml 55.22 ml/m  RA Volume:   37.30 ml  24.01 ml/m LA Vol (A4C):   81.5 ml 52.45 ml/m LA Biplane Vol: 85.2 ml 54.83 ml/m AORTIC VALVE AV Area (Vmax):    1.99 cm AV Area (Vmean):   2.02 cm AV Area (VTI):     2.25 cm AV Vmax:           110.50 cm/s AV Vmean:          77.550 cm/s AV VTI:            0.213 m AV Peak Grad:      4.9 mmHg AV Mean Grad:      3.0 mmHg LVOT Vmax:         77.40 cm/s LVOT Vmean:        55.200 cm/s LVOT VTI:          0.169 m LVOT/AV VTI ratio: 0.79  AORTA Ao Root diam: 2.90 cm Ao Asc diam:  2.70 cm  MITRAL VALVE                TRICUSPID VALVE MV Area (PHT): 5.54 cm     TR Peak grad:   25.0 mmHg MV Area VTI:   1.76 cm     TR Vmax:        250.00 cm/s MV Peak grad:  4.2 mmHg MV Mean grad:  1.0 mmHg     SHUNTS MV Vmax:       1.02 m/s     Systemic VTI:  0.17 m MV Vmean:      54.1 cm/s    Systemic Diam: 1.90 cm MV Decel Time: 137 msec MV E velocity: 104.00 cm/s MV A velocity: 78.70 cm/s MV E/A ratio:  1.32  Riley Lam MD Electronically signed by Riley Lam MD Signature Date/Time: 10/31/2022/4:21:41 PM    Final             Laboratory Data:  Chemistry Recent Labs  Lab 02/03/23 1656 02/03/23 2244 02/04/23 0450  NA 126* 130* 132*  K 3.7  --  3.6  CL 93*  --  92*  CO2 26  --  30  GLUCOSE 99  --  96  BUN 14  --  13  CREATININE 0.55  --  0.68  CALCIUM 8.3*  --  8.8*  GFRNONAA >60  --  >60  ANIONGAP 7  --  10    Recent Labs  Lab 02/03/23 1656 02/04/23 0450  PROT 6.2* 6.6  ALBUMIN 3.0* 3.1*  AST 21 20  ALT 25 25  ALKPHOS 111 116  BILITOT 0.7 0.7   Hematology Recent Labs  Lab 02/03/23 1656 02/04/23 0450  WBC 11.0* 9.9  RBC 4.24 4.16  HGB 12.0 11.9*  HCT 37.4 36.9  MCV 88.2 88.7  MCH 28.3 28.6  MCHC 32.1 32.2  RDW 15.1 15.1  PLT 299 308  Cardiac EnzymesNo results for input(s):  "TROPONINI" in the last 168 hours. No results for input(s): "TROPIPOC" in the last 168 hours.  BNP Recent Labs  Lab 02/03/23 2244  BNP 969.8*    DDimer No results for input(s): "DDIMER" in the last 168 hours.   Assessment and Plan:   Congestive Heart Failure with Mildly Reduced Ejection Fraction - acute on chronic, NYHA III, Stage C - Worsening congestive heart failure with EF 45%. Symptoms include dry cough, dyspnea, orthopnea, and elevated BNP (9562). Hyponatremia improving (126 to 132). No acute kidney injury.  - Discussed fluid removal vs. kidney function balance.   - Potential benefits of SGLT2 inhibitors noted, but increased UTI risk due to urologic history. Emphasized balanced diuresis to avoid rapid sodium changes.  - Mortality benefit of medications minimal in 76 year olds. - Administer higher dose of IV Lasix (20 IV BID) - Transition from losartan to low dose Entresto - Consider SGLT2 inhibitor at discharge if urologic symptoms improve, she is active at baseline - Monitor sodium levels closely - Assess need for oxygen intermittently - Ensure she can lay flat without dyspnea before discharge - continue her baseline GDMT  Hypertension - Significantly elevated blood pressure. Transitioning to [facility] for better control and heart failure management. - Transition from losartan to low dose Entresto  Paroxysmal Atrial Tachycardia - Telemetry shows sinus rhythm with short runs of SVT and PACs. Asymptomatic, managed with beta blockers. No anticoagulation needed as no atrial fibrillation or flutter. - Continue current beta blocker therapy, she is asymptomatic  General Health Maintenance - reviewed her care at length with her daugther, aide, and patient  Follow-up - will send courtesy message to Dr. Allyson Sabal; family was reviewing their family tree and thinks they may be distantly related to Dr. Allyson Sabal  For questions or updates, please contact [facility] Please consult  [email or web address] for contact info under Cardiology/STEMI.   Riley Lam, MD FASE [facility] Cardiologist [facility]  [address] [place], [address] [place], [place] [address] [phone]  9:24 AM

## 2023-02-04 NOTE — Evaluation (Signed)
Occupational Therapy Evaluation Patient Details Name: Mary [name] MRN: [id number] DOB: 1927/03/04 Today's Date: 02/04/2023   History of Present Illness 89 yr old female admitted with cough, SOB, and nausea. Found to have heart failure exacerbation. PMH: osteoporosis, HF, suprapubic catheter, breat CA s/p mastectomy in 2007, alopecia   Clinical Impression   The pt is currently limited by the below listed deficits, which compromise her ADL performance (see OT problem list). Today, she was also noted to be with general deconditioning and reports of acute 8/10 LLE pain; she reported pain increases with standing and dynamic activities (nurse informed). She will benefit from OT services to maximize her ADL performance and to decrease the risk for restricted participation in meaningful activities. Recommend return home with caregivers and home health therapy.       If plan is discharge home, recommend the following: Assist for transportation;Help with stairs or ramp for entrance;Assistance with cooking/housework;A lot of help with bathing/dressing/bathroom    Functional Status Assessment  Patient has had a recent decline in their functional status and demonstrates the ability to make significant improvements in function in a reasonable and predictable amount of time.  Equipment Recommendations  Tub/shower seat    Recommendations for Other Services       Precautions / Restrictions Precautions Precautions: Fall Precaution Comments: monitor sats Restrictions Weight Bearing Restrictions: No      Mobility Bed Mobility    General bed mobility comments: pt was received seated in chair    Transfers Overall transfer level: Needs assistance Equipment used: Rolling walker (2 wheels) Transfers: Sit to/from Stand Sit to Stand: Min assist                  Balance     Sitting balance-Leahy Scale: Fair         Standing balance comment: min assist with RW       ADL either  performed or assessed with clinical judgement   ADL Overall ADL's : Needs assistance/impaired Eating/Feeding: Independent;Sitting Eating/Feeding Details (indicate cue type and reason): based on clinical judgement Grooming: Set up;Sitting Grooming Details (indicate cue type and reason): simulated         Upper Body Dressing : Minimal assistance;Sitting Upper Body Dressing Details (indicate cue type and reason): based on clinical judgement Lower Body Dressing: Maximal assistance             Vision   Additional Comments: She correctly read the time depicted on the wall clock.            Pertinent Vitals/Pain Pain Assessment Pain Assessment: 0-10 Pain Score: 8  Pain Location: left leg - mid thigh and below Pain Intervention(s): Limited activity within patient's tolerance, Monitored during session, Other (comment) (nurse informed)     Extremity/Trunk Assessment    Upper Extremities: Shoulder ROM limitations noted. Functional grip strength bilaterally  Lower Extremity Assessment Lower Extremity Assessment: Generalized weakness;LLE deficits/detail        Communication Communication Communication: Hearing impairment   Cognition Arousal: Alert Behavior During Therapy: WFL for tasks assessed/performed Overall Cognitive Status: Within Functional Limits for tasks assessed              General Comments: Oriented x4, able to follow simple commands                Home Living Family/patient expects to be discharged to:: Private residence Living Arrangements: Alone Available Help at Discharge: Personal care attendant Type of Home: Other(Comment) (Condo) Home Access: Stairs to enter [employer]  of Steps: 3 Entrance Stairs-Rails: Right;Left Home Layout: Able to live on main level with bedroom/bathroom     Bathroom Shower/Tub: Walk-in shower   Bathroom Toilet: Handicapped height     Home Equipment: Grab bars - tub/shower;Rolling Environmental consultant (2  wheels);Transport chair   Additional Comments: She has 24/7 caregivers.      Prior Functioning/Environment Prior Level of Function : Needs assist             Mobility Comments:  (She required assist for functional transfers and supervision for ambulating inside her home using a RW. Transport chair used in community.) ADLs Comments:  (Pt required assist for dressing, spongebathing, and occasional assist for toileting. Her caregivers managed cooking and cleaning.)        OT Problem List: Decreased strength;Decreased activity tolerance;Impaired balance (sitting and/or standing);Pain      OT Treatment/Interventions: Self-care/ADL training;Therapeutic exercise;Energy conservation;DME and/or AE instruction;Therapeutic activities;Patient/family education;Balance training    OT Goals(Current goals can be found in the care plan section) Acute Rehab OT Goals OT Goal Formulation: With patient/family Time For Goal Achievement: 02/18/23 Potential to Achieve Goals: Good ADL Goals Pt Will Perform Upper Body Dressing: with set-up;sitting Pt Will Perform Lower Body Dressing: with min assist;sit to/from stand;sitting/lateral leans Pt Will Transfer to Toilet: with contact guard assist;ambulating Pt Will Perform Toileting - Clothing Manipulation and hygiene: with contact guard assist;sit to/from stand  OT Frequency: Min 1X/week       AM-PAC OT "6 Clicks" Daily Activity     Outcome Measure Help from another person eating meals?: None Help from another person taking care of personal grooming?: A Little Help from another person toileting, which includes using toliet, bedpan, or urinal?: A Lot Help from another person bathing (including washing, rinsing, drying)?: A Lot Help from another person to put on and taking off regular upper body clothing?: A Little Help from another person to put on and taking off regular lower body clothing?: A Lot 6 Click Score: 16   End of Session Equipment Utilized  During Treatment: Oxygen;Rolling walker (2 wheels) Nurse Communication: Other (comment) (acute LLE pain)  Activity Tolerance: Patient limited by pain Patient left: in chair;with call bell/phone within reach;with chair alarm set;with family/visitor present  OT Visit Diagnosis: Unsteadiness on feet (R26.81);Muscle weakness (generalized) (M62.81);Pain;Other abnormalities of gait and mobility (R26.89) Pain - Right/Left: Left Pain - part of body: Leg                Time: 4098-1191 OT Time Calculation (min): 22 min Charges:  OT General Charges $OT Visit: 1 Visit OT Evaluation $OT Eval Moderate Complexity: 1 Mod    Kennedy Brines L Aizley Stenseth, OTR/L 02/04/2023, 3:56 PM

## 2023-02-04 NOTE — Progress Notes (Signed)
  Echocardiogram 2D Echocardiogram has been performed.  Mary [name] 02/04/2023, 9:43 AM

## 2023-02-05 ENCOUNTER — Encounter: Payer: Self-pay | Admitting: Family Medicine

## 2023-02-05 DIAGNOSIS — I4719 Other supraventricular tachycardia: Secondary | ICD-10-CM | POA: Diagnosis not present

## 2023-02-05 DIAGNOSIS — I509 Heart failure, unspecified: Secondary | ICD-10-CM | POA: Diagnosis not present

## 2023-02-05 DIAGNOSIS — K5904 Chronic idiopathic constipation: Secondary | ICD-10-CM | POA: Diagnosis not present

## 2023-02-05 DIAGNOSIS — I5043 Acute on chronic combined systolic (congestive) and diastolic (congestive) heart failure: Secondary | ICD-10-CM | POA: Diagnosis not present

## 2023-02-05 LAB — BASIC METABOLIC PANEL
Anion gap: 10 (ref 5–15)
BUN: 17 mg/dL (ref 8–23)
CO2: 28 mmol/L (ref 22–32)
Calcium: 9.1 mg/dL (ref 8.9–10.3)
Chloride: 93 mmol/L — ABNORMAL LOW (ref 98–111)
Creatinine, Ser: 0.63 mg/dL (ref 0.44–1.00)
GFR, Estimated: >60 mL/min (ref >60)
Glucose, Bld: 117 mg/dL — ABNORMAL HIGH (ref 70–99)
Potassium: 3.7 mmol/L (ref 3.5–5.1)
Sodium: 131 mmol/L — ABNORMAL LOW (ref 135–145)

## 2023-02-05 LAB — CBC
HCT: 38.3 % (ref 36.0–46.0)
Hemoglobin: 12.2 g/dL (ref 12.0–15.0)
MCH: 28.3 pg (ref 26.0–34.0)
MCHC: 31.9 g/dL (ref 30.0–36.0)
MCV: 88.9 fL (ref 80.0–100.0)
Platelets: 338 10*3/uL (ref 150–400)
RBC: 4.31 MIL/uL (ref 3.87–5.11)
RDW: 15.1 % (ref 11.5–15.5)
WBC: 10.1 10*3/uL (ref 4.0–10.5)
nRBC: 0 % (ref 0.0–0.2)

## 2023-02-05 LAB — MAGNESIUM: Magnesium: 2 mg/dL (ref 1.7–2.4)

## 2023-02-05 LAB — GLUCOSE, CAPILLARY: Glucose-Capillary: 128 mg/dL — ABNORMAL HIGH (ref 70–99)

## 2023-02-05 MED ORDER — FUROSEMIDE 10 MG/ML IJ SOLN
40.0000 mg | Freq: Two times a day (BID) | INTRAMUSCULAR | Status: AC
  Administered 2023-02-05 – 2023-02-06 (×3): 40 mg via INTRAVENOUS
  Filled 2023-02-05 (×3): qty 4

## 2023-02-05 MED ORDER — SALINE SPRAY 0.65 % NA SOLN
1.0000 | NASAL | Status: DC | PRN
  Filled 2023-02-05: qty 44

## 2023-02-05 MED ORDER — LIDOCAINE 5 % EX PTCH
1.0000 | MEDICATED_PATCH | Freq: Every day | CUTANEOUS | Status: DC
  Administered 2023-02-05 – 2023-02-08 (×4): 1 via TRANSDERMAL
  Filled 2023-02-05 (×4): qty 1

## 2023-02-05 NOTE — Progress Notes (Signed)
   Patient Name: Mary [name] Date of Encounter: 02/05/2023 Mary [name] Cardiologist: Nanetta Batty, MD   Interval Summary  .    Patient feeling well this morning with her only concern being persistent cough. No dyspnea, chest pain, palpitations. She is anxious to d/c as soon as possible.   Vital Signs .    Vitals:   02/04/23 1746 02/04/23 2311 02/05/23 0258 02/05/23 0500  BP: 124/88 (!) 157/65 (!) 144/82   Pulse:  (!) 45 (!) 43   Resp:  15    Temp:  97.7 F (36.5 C) 98.6 F (37 C)   TempSrc:  Oral Oral   SpO2:  93% 92%   Weight:    52.8 kg  Height:        Intake/Output Summary (Last 24 hours) at 02/05/2023 0941 Last data filed at 02/04/2023 2311 Gross per 24 hour  Intake 240 ml  Output 975 ml  Net -735 ml      02/05/2023    5:00 AM 02/04/2023    5:00 AM 02/03/2023    4:26 PM  Last 3 Weights  Weight (lbs) 116 lb 6.5 oz 118 lb 2.7 oz 119 lb 0.8 oz  Weight (kg) 52.8 kg 53.6 kg 54 kg      Telemetry/ECG    Sinus rhythm with frequent PACs and isolated runs of atrial tachycardia/SVT - Personally Reviewed  Physical Exam .   GEN: No acute distress.   Neck: No JVD Cardiac: RRR, no murmurs, rubs, or gallops.  Respiratory: diffusely diminished with inspiratory wheezing GI: Soft, nontender, non-distended  MS: No edema  Assessment & Plan .   Mary [name] is  a 89 year old individual with a history of heart failure with mildly reduced ejection fraction (EF 45%), paroxysmal atrial tachycardia, hypertension. She presented with dry cough, shortness of breath, nausea.    Congestive Heart Failure with Mildly Reduced Ejection Fraction   Patient with acute on chronic CHF. LVEF 45-50% with global hypokinesis on TTE this admission. Mildly elevated PA pressure. BNP 969.8, up from 674.3 as of August.  Net negative -2.975L with IV lasix 20mg  BID (not clear that I/O reported today). Stable renal function. Would plan for one more day of IV diuretics. Will likely need increase  to daily dosing at d/c. Continue Entresto 24-26mg  BID, Spironolactone 12.5mg , toprol XL 25mg  Consider SGLT2 at discharge  Hypertension  BP better controlled on Entresto 24-26mg  BID (transitioned from Losartan). Continue this along with Spironolactone and Toprol XL 25mg .  Paroxysmal Atrial Tachycardia   Patient continuing to have frequent PACs on telemetry with isolated runs of SVT/atrial tachycardia. No evidence of afib or atrial flutter.  Continue Toprol XL 25mg   For questions or updates, please contact [place] [name] Please consult [email or web address] for contact info under        Signed, Mary Gold, PA-C

## 2023-02-05 NOTE — Progress Notes (Signed)
During my rounding this morning, I observed pt home aid giving her nose spray from home. I took the nose spray to the pharmacy. I educated the home aid and pt regarding taking medication from home with medication from the hospital without authorization.

## 2023-02-05 NOTE — Progress Notes (Addendum)
PROGRESS NOTE    Mary [name]  UJW:[id number] DOB: [birth year] DOA: 02/03/2023 PCP: Joselyn Arrow, MD    Brief Narrative:   Mary [name] is a 89 y.o. female with medical history significant of systolic heart failure (EF 45%), paroxysmal atrial tachycardia, HTN, history of invasive breast carcinoma s/p right total mastectomy (2007), presented to the hospital with 2 to 3-week history of worsening dry cough shortness of breath and nausea.  History of admission in August 2024 for systolic heart failure with bilateral pleural effusion.  Patient subsequently followed Dr. Allyson Sabal with [facility] cardiology and was placed on metoprolol succinate, spironolactone, losartan, and advised to take Lasix 10 mg 3 times a week.  Patient lives alone but does have assistance at home. her dry weight is about 116 pounds.  In the ED patient had mild leukocytosis and BMP showed mild hyponatremia.  Chest x-ray showed bilateral pleural effusion.  CT abdomen pelvis showing bilateral pleural effusions along with a nodular consolidation within the lingula.  Patient was given Rocephin and patient was admitted hospital for further evaluation and treatment.   Assessment and plan.  Acute on chronic systolic heart failure exacerbation Review of previous 2D echocardiogram from 10/31/2022 showed  LVEF 45% with mildly decreased LV function, severely dilated left atrium, severe mitral annular calcification.  Patient is on metoprolol succinate 25 mg daily, spironolactone 12.5 mg daily, losartan 12.5 mg daily.  She is also on Lasix 10 mg 3 times a week but unclear on consistency of use.  Imaging shows effusion with lower extremity edema.   BNP elevated.   TSH at 2.6.  Continue metoprolol, spironolactone losartan.  Continue daily weights,Strict I/O's.PT/OT eval.  Seen by cardiology and on IV Lasix 20mg  twice daily.   Lingular opacity?  Pneumonia has been ruled out Nodular consolidation within the lingula noted on CT abdomen pelvis.  Mild leukocytosis  with cough and shortness of breath..  No fever and leukocytosis has improved.  Received 1 dose of Rocephin.  Hold off with further antibiotic for now.    moderate Hyponatremia Likely secondary to hypervolemic hyponatremia.  On IV diuretic.  Sodium level of 131   Atrial premature complexes Paroxysmal atrial tachycardia RBBB Patient with known history of RBBB and paroxysmal atrial tachycardia.  Had episode of of SVT.  Continue metoprolol.    Constipation Noted on CT scan.  Continue MiraLAX and Senokot   Hypertension -Continue home Toprol-XL, losartan, spironolactone    OA of right knee Continue Voltaren gel.   Restless Leg Syndrome Off gabapentin due to nightmares.   History of invasive mammary carcinoma Status post total right mastectomy in 02/2006.  Deconditioning debility.  Check PT OT evaluation.  Might need rehabilitation placement.      DVT prophylaxis: enoxaparin (LOVENOX) injection 40 mg Start: 02/03/23 2200   Code Status:     Code Status: Limited: Do not attempt resuscitation (DNR) -DNR-LIMITED -Do Not Intubate/DNI   Disposition: Uncertain at this time we will get PT OT evaluation.  Status is: Inpatient Remains inpatient appropriate because: Decompensated heart failure   Family Communication:  Spoke with the patient's daughter at bedside.  Consultants:  Cardiology  Procedures:  None  Antimicrobials:  Rocephin IV x 1  Anti-infectives (From admission, onward)    Start     Dose/Rate Route Frequency Ordered Stop   02/03/23 2000  cefTRIAXone (ROCEPHIN) 1 g in sodium chloride 0.9 % 100 mL IVPB        1 g 200 mL/hr over 30 Minutes Intravenous  Once 02/03/23  1953 02/03/23 2043       Subjective: Today, patient was seen and examined at bedside.  Patient still complains of dry hacking cough shortness of breath persist but slightly better.  Denies any chest pain, palpitation, fever or chills.  Objective: Vitals:   02/05/23 0258 02/05/23 0500 02/05/23 0800  02/05/23 1200  BP: (!) 144/82   (!) 104/56  Pulse: (!) 43   89  Resp:   15 16  Temp: 98.6 F (37 C)   98.3 F (36.8 C)  TempSrc: Oral   Oral  SpO2: 92%   91%  Weight:  52.8 kg    Height:        Intake/Output Summary (Last 24 hours) at 02/05/2023 1215 Last data filed at 02/05/2023 1126 Gross per 24 hour  Intake 220 ml  Output 1450 ml  Net -1230 ml   Filed Weights   02/03/23 1626 02/04/23 0500 02/05/23 0500  Weight: 54 kg 53.6 kg 52.8 kg    Physical Examination: Body mass index is 20.62 kg/m.   General:  Average built, not in obvious distress, on nasal cannula oxygen, elderly female HENT:   No scleral pallor or icterus noted. Oral mucosa is moist.  Chest:  Diminished breath sounds bilaterally.  Mild wheezes noted CVS: S1 &S2 heard. No murmur.  Regular rate and rhythm. Abdomen: Soft, nontender, nondistended.  Bowel sounds are heard.   Extremities: No cyanosis, clubbing or edema.  Peripheral pulses are palpable.  Bilateral feet with erythema warm to palpation with distal capillary refill and pulses Psych: Alert, awake and Communicative, CNS:  No cranial nerve deficits.  Generalized weakness noted, moves all extremities Skin: Warm and dry.    Data Reviewed:   CBC: Recent Labs  Lab 02/03/23 1656 02/04/23 0450 02/05/23 0400  WBC 11.0* 9.9 10.1  NEUTROABS 9.2*  --   --   HGB 12.0 11.9* 12.2  HCT 37.4 36.9 38.3  MCV 88.2 88.7 88.9  PLT 299 308 338    Basic Metabolic Panel: Recent Labs  Lab 02/03/23 1656 02/03/23 2244 02/04/23 0450 02/05/23 0400  NA 126* 130* 132* 131*  K 3.7  --  3.6 3.7  CL 93*  --  92* 93*  CO2 26  --  30 28  GLUCOSE 99  --  96 117*  BUN 14  --  13 17  CREATININE 0.55  --  0.68 0.63  CALCIUM 8.3*  --  8.8* 9.1  MG  --   --   --  2.0    Liver Function Tests: Recent Labs  Lab 02/03/23 1656 02/04/23 0450  AST 21 20  ALT 25 25  ALKPHOS 111 116  BILITOT 0.7 0.7  PROT 6.2* 6.6  ALBUMIN 3.0* 3.1*     Radiology  Studies: ECHOCARDIOGRAM COMPLETE  Result Date: 02/04/2023    ECHOCARDIOGRAM REPORT   Patient Name:   Mary [name] Date of Exam: 02/04/2023 Medical Rec #:  [id number]   Height:       63.0 in Accession #:    [id number]  Weight:       118.2 lb Date of Birth:  12/19/1926  BSA:          1.546 m Patient Age:    89 years    BP:           153/91 mmHg Patient Gender: F           HR:           86 bpm. Exam Location:  Inpatient Procedure: 2D Echo, Cardiac Doppler and Color Doppler Indications:    CHF, acute diastolic  History:        Patient has prior history of Echocardiogram examinations, most                 recent 10/31/2022. Risk Factors:Hypertension and Dyslipidemia.  Sonographer:    Karma Ganja Referring Phys: [id number] Briscoe Burns  Sonographer Comments: Technically challenging study due to limited acoustic windows. Image acquisition challenging due to uncooperative patient. IMPRESSIONS  1. Left ventricular ejection fraction, by estimation, is 45 to 50%. The left ventricle has mildly decreased function. The left ventricle demonstrates global hypokinesis. Left ventricular diastolic function could not be evaluated.  2. Right ventricular systolic function is normal. The right ventricular size is not well visualized. There is mildly elevated pulmonary artery systolic pressure.  3. Left atrial size was moderately dilated.  4. The mitral valve is degenerative. Trivial mitral valve regurgitation. No evidence of mitral stenosis. Severe mitral annular calcification.  5. The aortic valve is tricuspid. There is mild calcification of the aortic valve. There is mild thickening of the aortic valve. Aortic valve regurgitation is not visualized. Aortic valve sclerosis/calcification is present, without any evidence of aortic stenosis. Comparison(s): No significant change from prior study. FINDINGS  Left Ventricle: Left ventricular ejection fraction, by estimation, is 45 to 50%. The left ventricle has mildly decreased function. The  left ventricle demonstrates global hypokinesis. The left ventricular internal cavity size was normal in size. There is  no left ventricular hypertrophy. Left ventricular diastolic function could not be evaluated due to mitral annular calcification (moderate or greater). Left ventricular diastolic function could not be evaluated. Right Ventricle: The right ventricular size is not well visualized. Right vetricular wall thickness was not well visualized. Right ventricular systolic function is normal. There is mildly elevated pulmonary artery systolic pressure. The tricuspid regurgitant velocity is 2.95 m/s, and with an assumed right atrial pressure of 3 mmHg, the estimated right ventricular systolic pressure is 37.8 mmHg. Left Atrium: Left atrial size was moderately dilated. Right Atrium: Right atrial size was normal in size. Pericardium: There is no evidence of pericardial effusion. Mitral Valve: The mitral valve is degenerative in appearance. There is moderate thickening of the mitral valve leaflet(s). There is moderate calcification of the mitral valve leaflet(s). Severe mitral annular calcification. Trivial mitral valve regurgitation. No evidence of mitral valve stenosis. Tricuspid Valve: The tricuspid valve is normal in structure. Tricuspid valve regurgitation is trivial. No evidence of tricuspid stenosis. Aortic Valve: The aortic valve is tricuspid. There is mild calcification of the aortic valve. There is mild thickening of the aortic valve. Aortic valve regurgitation is not visualized. Aortic valve sclerosis/calcification is present, without any evidence of aortic stenosis. Aortic valve mean gradient measures 3.0 mmHg. Aortic valve peak gradient measures 6.2 mmHg. Aortic valve area, by VTI measures 1.55 cm. Pulmonic Valve: The pulmonic valve was not well visualized. Pulmonic valve regurgitation is trivial. Aorta: The aortic root and ascending aorta are structurally normal, with no evidence of dilitation.  IAS/Shunts: The interatrial septum was not well visualized. Additional Comments: There is a small pleural effusion in the left lateral region.  LEFT VENTRICLE PLAX 2D LVIDd:         4.50 cm     Diastology LVIDs:         3.40 cm     LV e' medial:    3.37 cm/s LV PW:         1.10 cm  LV E/e' medial:  21.8 LV IVS:        0.90 cm     LV e' lateral:   8.70 cm/s LVOT diam:     1.80 cm     LV E/e' lateral: 8.4 LV SV:         32 LV SV Index:   21 LVOT Area:     2.54 cm  LV Volumes (MOD) LV vol d, MOD A2C: 69.9 ml LV vol d, MOD A4C: 64.7 ml LV vol s, MOD A2C: 28.8 ml LV vol s, MOD A4C: 29.7 ml LV SV MOD A2C:     41.1 ml LV SV MOD A4C:     64.7 ml LV SV MOD BP:      38.3 ml RIGHT VENTRICLE RV Basal diam:  4.40 cm RV S prime:     8.16 cm/s TAPSE (M-mode): 2.4 cm LEFT ATRIUM           Index        RIGHT ATRIUM           Index LA diam:      3.20 cm 2.07 cm/m   RA Area:     14.50 cm LA Vol (A2C): 37.6 ml 24.32 ml/m  RA Volume:   35.60 ml  23.03 ml/m LA Vol (A4C): 47.9 ml 30.98 ml/m  AORTIC VALVE AV Area (Vmax):    1.53 cm AV Area (Vmean):   1.41 cm AV Area (VTI):     1.55 cm AV Vmax:           124.00 cm/s AV Vmean:          82.800 cm/s AV VTI:            0.208 m AV Peak Grad:      6.2 mmHg AV Mean Grad:      3.0 mmHg LVOT Vmax:         74.60 cm/s LVOT Vmean:        45.800 cm/s LVOT VTI:          0.127 m LVOT/AV VTI ratio: 0.61  AORTA Ao Root diam: 2.80 cm Ao Asc diam:  2.90 cm MITRAL VALVE               TRICUSPID VALVE MV Area (PHT): 3.02 cm    TR Peak grad:   34.8 mmHg MV Decel Time: 251 msec    TR Vmax:        295.00 cm/s MV E velocity: 73.30 cm/s MV A velocity: 81.80 cm/s  SHUNTS MV E/A ratio:  0.90        Systemic VTI:  0.13 m                            Systemic Diam: 1.80 cm Jodelle Red MD Electronically signed by Jodelle Red MD Signature Date/Time: 02/04/2023/2:19:56 PM    Final    CT ABDOMEN PELVIS W CONTRAST  Result Date: 02/03/2023 CLINICAL DATA:  Nausea, dry heaves for several days,  suspected bowel obstruction EXAM: CT ABDOMEN AND PELVIS WITH CONTRAST TECHNIQUE: Multidetector CT imaging of the abdomen and pelvis was performed using the standard protocol following bolus administration of intravenous contrast. RADIATION DOSE REDUCTION: This exam was performed according to the departmental dose-optimization program which includes automated exposure control, adjustment of the mA and/or kV according to patient size and/or use of iterative reconstruction technique. CONTRAST:  OMNIPAQUE IOHEXOL 300 MG/ML  SOLN COMPARISON:  12/14/2022  FINDINGS: Lower chest: Free-flowing bilateral pleural effusions, volume estimated the 500 cc. Nonspecific nodular consolidation within the lingula, measuring up to 17 mm. This could reflect localized infection or inflammation, and follow-up is recommended to exclude pulmonary mass. Dependent consolidation within the lower lobes. Cardiomegaly, with prominent calcifications of the mitral annulus and biatrial dilatation. Hepatobiliary: No focal liver abnormality is seen. No gallstones, gallbladder wall thickening, or biliary dilatation. Pancreas: Unremarkable. No pancreatic ductal dilatation or surrounding inflammatory changes. Spleen: Normal in size without focal abnormality. Adrenals/Urinary Tract: Bilateral renal cortical cysts do not require specific imaging follow-up. No renal calculi or obstructive uropathy. The adrenals are unremarkable. Bladder is decompressed by a suprapubic catheter. There are 2 small remaining bladder calculi measuring up to 4 mm. The remaining calculi have been evacuated in the interim. Stomach/Bowel: Postsurgical changes from partial distal colectomy and reanastomosis. No bowel obstruction or ileus. Moderate retained stool within the colon. No bowel wall thickening or inflammatory change. Vascular/Lymphatic: Aortic atherosclerosis. No enlarged abdominal or pelvic lymph nodes. Reproductive: Uterus is atrophic.  No adnexal masses. Other: No  free fluid or free intraperitoneal gas. No abdominal wall hernia. Musculoskeletal: No acute displaced fractures. Severe bilateral hip osteoarthritis again noted, right greater than left. Bilateral hip effusions are unchanged. Reconstructed images demonstrate no additional findings. IMPRESSION: 1. Bilateral pleural effusions. 2. Nodular consolidation within the lingula, which may reflect localized inflammation or infection. Follow-up imaging is recommended to document resolution and exclude parenchymal lung nodules. 3. No bowel obstruction or ileus. Moderate retained stool throughout the colon. 4. Small residual bladder calculi as above. The majority of the calculi have been evacuated since previous exam. Stable suprapubic catheter in place. 5.  Aortic Atherosclerosis (ICD10-I70.0). 6. Stable degenerative changes of the bilateral hips, right greater than left. Electronically Signed   By: Sharlet Salina M.D.   On: 02/03/2023 19:10   DG Chest Portable 1 View  Result Date: 02/03/2023 CLINICAL DATA:  Cough. Several day history of nausea and dry heaving EXAM: PORTABLE CHEST 1 VIEW COMPARISON:  Chest radiograph dated 10/30/2022 FINDINGS: Hyperinflated lungs. No focal consolidations. Mild blunting of bilateral costophrenic angles. No pneumothorax. Similar mildly enlarged cardiomediastinal silhouette. No acute osseous abnormality. IMPRESSION: 1. Hyperinflated lungs, which can be seen in the setting of COPD. 2. Mild blunting of bilateral costophrenic angles, which may represent trace pleural effusions or pleural thickening. 3. Similar mild cardiomegaly. Electronically Signed   By: Agustin Cree M.D.   On: 02/03/2023 19:06      LOS: 1 day    Joycelyn Das, MD [facility] Available via Epic secure chat 7am-7pm After these hours, please refer to coverage provider listed on [email or web address] 02/05/2023, 12:15 PM

## 2023-02-05 NOTE — Plan of Care (Signed)
  Problem: Clinical Measurements: Goal: Respiratory complications will improve Outcome: Progressing Goal: Cardiovascular complication will be avoided Outcome: Progressing   Problem: Nutrition: Goal: Adequate nutrition will be maintained Outcome: Progressing   Problem: Coping: Goal: Level of anxiety will decrease Outcome: Progressing   Problem: Elimination: Goal: Will not experience complications related to urinary retention Outcome: Progressing   Problem: Pain Management: Goal: General experience of comfort will improve Outcome: Progressing

## 2023-02-05 NOTE — Progress Notes (Signed)
Pt refuses to wear Oxygen.

## 2023-02-05 NOTE — Progress Notes (Signed)
Mobility Specialist - Progress Note  (RA) Pre-mobility: 71 bpm HR, 94% SpO2 During mobility: 116 bpm HR, 90% SpO2 Post-mobility: 96 bpm HR, 93% SPO2   02/05/23 1543  Mobility  Activity Ambulated with assistance in hallway  Level of Assistance Contact guard assist, steadying assist  Assistive Device Front wheel walker  Distance Ambulated (ft) 100 ft  Range of Motion/Exercises Active  Activity Response Tolerated well  Mobility Referral Yes  $Mobility charge 1 Mobility  Mobility Specialist Start Time (ACUTE ONLY) 1525  Mobility Specialist Stop Time (ACUTE ONLY) 1543  Mobility Specialist Time Calculation (min) (ACUTE ONLY) 18 min   Pt was found on recliner chair and agreeable to ambulate. C/o L leg pain during session and grew fatigued with session. At EOS returned to recliner chair with all needs met. Call bell in reach and family in room. RN notified of session.  Billey Chang Mobility Specialist

## 2023-02-05 NOTE — Plan of Care (Signed)

## 2023-02-06 DIAGNOSIS — I4719 Other supraventricular tachycardia: Secondary | ICD-10-CM | POA: Diagnosis not present

## 2023-02-06 DIAGNOSIS — E871 Hypo-osmolality and hyponatremia: Secondary | ICD-10-CM

## 2023-02-06 DIAGNOSIS — I5043 Acute on chronic combined systolic (congestive) and diastolic (congestive) heart failure: Secondary | ICD-10-CM | POA: Diagnosis not present

## 2023-02-06 LAB — GLUCOSE, CAPILLARY: Glucose-Capillary: 101 mg/dL — ABNORMAL HIGH (ref 70–99)

## 2023-02-06 LAB — CBC
HCT: 37.2 % (ref 36.0–46.0)
Hemoglobin: 12.2 g/dL (ref 12.0–15.0)
MCH: 28.6 pg (ref 26.0–34.0)
MCHC: 32.8 g/dL (ref 30.0–36.0)
MCV: 87.1 fL (ref 80.0–100.0)
Platelets: 351 10*3/uL (ref 150–400)
RBC: 4.27 MIL/uL (ref 3.87–5.11)
RDW: 15.2 % (ref 11.5–15.5)
WBC: 9 10*3/uL (ref 4.0–10.5)
nRBC: 0 % (ref 0.0–0.2)

## 2023-02-06 LAB — BASIC METABOLIC PANEL
Anion gap: 11 (ref 5–15)
BUN: 18 mg/dL (ref 8–23)
CO2: 28 mmol/L (ref 22–32)
Calcium: 8.6 mg/dL — ABNORMAL LOW (ref 8.9–10.3)
Chloride: 89 mmol/L — ABNORMAL LOW (ref 98–111)
Creatinine, Ser: 0.71 mg/dL (ref 0.44–1.00)
GFR, Estimated: >60 mL/min (ref >60)
Glucose, Bld: 123 mg/dL — ABNORMAL HIGH (ref 70–99)
Potassium: 3.5 mmol/L (ref 3.5–5.1)
Sodium: 128 mmol/L — ABNORMAL LOW (ref 135–145)

## 2023-02-06 LAB — MAGNESIUM: Magnesium: 1.9 mg/dL (ref 1.7–2.4)

## 2023-02-06 MED ORDER — LORATADINE 10 MG PO TABS
10.0000 mg | ORAL_TABLET | Freq: Every day | ORAL | Status: DC
  Administered 2023-02-06 – 2023-02-08 (×3): 10 mg via ORAL
  Filled 2023-02-06 (×3): qty 1

## 2023-02-06 MED ORDER — DIAZEPAM 2 MG PO TABS
2.0000 mg | ORAL_TABLET | Freq: Three times a day (TID) | ORAL | Status: DC | PRN
  Administered 2023-02-06: 2 mg via ORAL
  Filled 2023-02-06: qty 1

## 2023-02-06 MED ORDER — VITAMIN D 25 MCG (1000 UNIT) PO TABS
2000.0000 [IU] | ORAL_TABLET | Freq: Every day | ORAL | Status: DC
  Administered 2023-02-06 – 2023-02-08 (×3): 2000 [IU] via ORAL
  Filled 2023-02-06 (×3): qty 2

## 2023-02-06 MED ORDER — METOPROLOL SUCCINATE ER 50 MG PO TB24
50.0000 mg | ORAL_TABLET | Freq: Every day | ORAL | Status: DC
  Administered 2023-02-07 – 2023-02-08 (×2): 50 mg via ORAL
  Filled 2023-02-06 (×2): qty 1

## 2023-02-06 MED ORDER — POLYVINYL ALCOHOL 1.4 % OP SOLN
1.0000 [drp] | OPHTHALMIC | Status: DC | PRN
  Filled 2023-02-06: qty 15

## 2023-02-06 MED ORDER — ONDANSETRON 4 MG PO TBDP
4.0000 mg | ORAL_TABLET | Freq: Three times a day (TID) | ORAL | Status: DC | PRN
  Administered 2023-02-07 (×2): 4 mg via ORAL
  Filled 2023-02-06 (×2): qty 1

## 2023-02-06 MED ORDER — TRAMADOL HCL 50 MG PO TABS: 25.0000 mg | ORAL_TABLET | Freq: Three times a day (TID) | ORAL | Status: DC | PRN

## 2023-02-06 MED ORDER — TRAMADOL HCL 50 MG PO TABS
50.0000 mg | ORAL_TABLET | Freq: Three times a day (TID) | ORAL | Status: DC | PRN
  Administered 2023-02-06: 50 mg via ORAL
  Filled 2023-02-06: qty 1

## 2023-02-06 MED ORDER — FUROSEMIDE 20 MG PO TABS
20.0000 mg | ORAL_TABLET | Freq: Every day | ORAL | Status: DC
  Administered 2023-02-07 – 2023-02-08 (×2): 20 mg via ORAL
  Filled 2023-02-06 (×2): qty 1

## 2023-02-06 NOTE — Progress Notes (Signed)
PROGRESS NOTE    Mary [name]  ZOX:[id number] DOB: Feb 02, 1927 DOA: 02/03/2023 PCP: Joselyn Arrow, MD    Brief Narrative:   Mary [name] is a 89 y.o. female with medical history significant of systolic heart failure (EF 45%), paroxysmal atrial tachycardia, HTN, history of invasive breast carcinoma s/p right total mastectomy (2007), presented to the hospital with 2 to 3-week history of worsening dry cough shortness of breath and nausea.  History of admission in August 2024 for systolic heart failure with bilateral pleural effusion.  Patient subsequently followed Dr. Allyson Sabal with [facility] and was placed on metoprolol succinate, spironolactone, losartan, and advised to take Lasix 10 mg 3 times a week.  Patient lives alone but does have assistance at home. her dry weight is about 116 pounds.  In the ED patient had mild leukocytosis and BMP showed mild hyponatremia.  Chest x-ray showed bilateral pleural effusion.  CT abdomen pelvis showing bilateral pleural effusions along with a nodular consolidation within the lingula.  Patient was given Rocephin and patient was admitted hospital for further evaluation and treatment.   Assessment and plan.  Acute on chronic systolic heart failure exacerbation Review of previous 2D echocardiogram from 10/31/2022 showed  LVEF 45% with mildly decreased LV function, severely dilated left atrium, severe mitral annular calcification.  Patient is on metoprolol succinate 25 mg daily, spironolactone 12.5 mg daily, losartan 12.5 mg daily.  She is also on Lasix 10 mg 3 times a week but unclear on consistency of use.  Imaging shows effusion with lower extremity edema.   BNP elevated.   TSH at 2.6.  Continue metoprolol spironolactone losartan has been discontinued and patient changed on Entresto at this time.  Cardiology has seen the patient again today and recommend IV diuretic today with potential change to oral tomorrow.  Continue daily weights,Strict I/O's.PT/OT eval   Lingular  opacity? Nodular consolidation within the lingula noted on CT abdomen pelvis.  Mild leukocytosis with cough and shortness of breath..  No fever and leukocytosis has improved.  unlikely to be pneumonia   Moderate Hyponatremia On diuretics.  Closely monitor.  Latest sodium of 128.   Atrial premature complexes Paroxysmal atrial tachycardia RBBB Patient with known history of RBBB and paroxysmal atrial tachycardia.  Had episode of of SVT.  Continue metoprolol.    Constipation Noted on CT scan.  Continue MiraLAX and Senokot   Hypertension -Continue home Toprol-XL spironolactone.  Losartan has been changed to [employer].   OA of right knee Continue Voltaren gel.   Restless Leg Syndrome Off gabapentin due to nightmares.   History of invasive mammary carcinoma Status post total right mastectomy in 02/2006.      DVT prophylaxis: enoxaparin (LOVENOX) injection 40 mg Start: 02/03/23 2200   Code Status:     Code Status: Limited: Do not attempt resuscitation (DNR) -DNR-LIMITED -Do Not Intubate/DNI   Disposition: Home with home health  likely in 1 to 2 days  Status is: Inpatient Remains inpatient appropriate because: Decompensated heart failure, IV diuretic, cardiology following   Family Communication: Spoke with the patient's daughter at bedside.  Consultants:  Cardiology  Procedures:  None  Antimicrobials:  Rocephin x 1  Anti-infectives (From admission, onward)    Start     Dose/Rate Route Frequency Ordered Stop   02/03/23 2000  cefTRIAXone (ROCEPHIN) 1 g in sodium chloride 0.9 % 100 mL IVPB        1 g 200 mL/hr over 30 Minutes Intravenous  Once 02/03/23 1953 02/03/23 2043  Subjective: Today, patient was seen and examined at bedside.  Complains of  cough.  Denies any shortness of breath chest pain fever chills or rigor  Objective: Vitals:   02/05/23 1200 02/05/23 1340 02/05/23 2003 02/06/23 0409  BP: (!) 104/56  (!) 150/85 136/75  Pulse: 89  (!) 50 86  Resp:  16 20 15 15   Temp: 98.3 F (36.8 C)  97.7 F (36.5 C) 99 F (37.2 C)  TempSrc: Oral  Oral Oral  SpO2: 91%  95% 93%  Weight:    51.9 kg  Height:        Intake/Output Summary (Last 24 hours) at 02/06/2023 1314 Last data filed at 02/06/2023 0030 Gross per 24 hour  Intake --  Output 1525 ml  Net -1525 ml   Filed Weights   02/04/23 0500 02/05/23 0500 02/06/23 0409  Weight: 53.6 kg 52.8 kg 51.9 kg    Physical Examination: Body mass index is 20.27 kg/m.  General:  Average built, not in obvious distress elderly female, HENT:   No scleral pallor or icterus noted. Oral mucosa is moist.  Chest:  Diminished breath sounds bilaterally.  Coarse breath sounds bilaterally with mild crackles. CVS: S1 &S2 heard. No murmur.  Regular rate and rhythm. Abdomen: Soft, nontender, nondistended.  Bowel sounds are heard.   Extremities: No cyanosis, clubbing or edema.  Peripheral pulses are palpable. Psych: Alert, awake and oriented, normal mood CNS:  No cranial nerve deficits.  Power equal in all extremities.   Skin: Warm and dry.  No rashes noted.  Data Reviewed:   CBC: Recent Labs  Lab 02/03/23 1656 02/04/23 0450 02/05/23 0400 02/06/23 0356  WBC 11.0* 9.9 10.1 9.0  NEUTROABS 9.2*  --   --   --   HGB 12.0 11.9* 12.2 12.2  HCT 37.4 36.9 38.3 37.2  MCV 88.2 88.7 88.9 87.1  PLT 299 308 338 351    Basic Metabolic Panel: Recent Labs  Lab 02/03/23 1656 02/03/23 2244 02/04/23 0450 02/05/23 0400 02/06/23 0356  NA 126* 130* 132* 131* 128*  K 3.7  --  3.6 3.7 3.5  CL 93*  --  92* 93* 89*  CO2 26  --  30 28 28   GLUCOSE 99  --  96 117* 123*  BUN 14  --  13 17 18   CREATININE 0.55  --  0.68 0.63 0.71  CALCIUM 8.3*  --  8.8* 9.1 8.6*  MG  --   --   --  2.0 1.9    Liver Function Tests: Recent Labs  Lab 02/03/23 1656 02/04/23 0450  AST 21 20  ALT 25 25  ALKPHOS 111 116  BILITOT 0.7 0.7  PROT 6.2* 6.6  ALBUMIN 3.0* 3.1*     Radiology Studies: No results found.    LOS: 2  days    Joycelyn Das, MD [facility] Available via Epic secure chat 7am-7pm After these hours, please refer to coverage provider listed on [email or web address] 02/06/2023, 1:14 PM

## 2023-02-06 NOTE — Progress Notes (Signed)
Physical Therapy Treatment Patient Details Name: Mary [name] MRN: [id number] DOB: 03/26/1926 Today's Date: 02/06/2023   History of Present Illness 89 y.o. female with presented to the hospital with 2 to 3-week history of worsening dry cough shortness of breath and nausea and admitted for acute on chronic systolic heart failure exacerbation.  History of admission in August 2024 for systolic heart failure with bilateral pleural effusion. Past medical history significant of systolic heart failure (EF 45%), paroxysmal atrial tachycardia, HTN, history of invasive breast carcinoma s/p right total mastectomy (2007), chronic R hip/groin pain, suprapubic catheter    PT Comments  Pt progressing this session however remains limited d/t fatigue and c/o bil knee and heel pain. Bil heels with blanchable erythema. RN and NT notified that pt may benefit from mepilex heel protectors.  D/c plan remains appropriate, pt will benefit from HHPT. Continue to follow in acute setting.   If plan is discharge home, recommend the following: A little help with walking and/or transfers;A little help with bathing/dressing/bathroom;Help with stairs or ramp for entrance   Can travel by private vehicle        Equipment Recommendations   (requests shower chair-would defer to HHPT)    Recommendations for Other Services       Precautions / Restrictions Precautions Precautions: Fall Restrictions Weight Bearing Restrictions: No     Mobility  Bed Mobility               General bed mobility comments: in recliner    Transfers Overall transfer level: Needs assistance Equipment used: Rolling walker (2 wheels) Transfers: Sit to/from Stand Sit to Stand: Min assist, Contact guard assist           General transfer comment: cues for hand placement, light assist to rise and control descent    Ambulation/Gait Ambulation/Gait assistance: Min assist Gait Distance (Feet): 40 Feet Assistive device: Rolling walker  (2 wheels) Gait Pattern/deviations: Step-to pattern, Decreased step length - left, Decreased step length - right, Trunk flexed       General Gait Details: pt limited by bil knee pain and fatgiue   Stairs             Wheelchair Mobility     Tilt Bed    Modified Rankin (Stroke Patients Only)       Balance Overall balance assessment: History of Falls   Sitting balance-Leahy Scale: Fair     Standing balance support: Bilateral upper extremity supported, Reliant on assistive device for balance, During functional activity Standing balance-Leahy Scale: Poor Standing balance comment: able to static stand with RW, min to CGA for dynamic tasks                            Cognition Arousal: Alert Behavior During Therapy: WFL for tasks assessed/performed Overall Cognitive Status: Within Functional Limits for tasks assessed                                          Exercises General Exercises - Lower Extremity Ankle Circles/Pumps: AROM, Both, 5 reps Long Arc Quad: AROM, Strengthening, 5 reps, Seated, Limitations Long Arc Quad Limitations: knee pain limiting ROM    General Comments        Pertinent Vitals/Pain Pain Assessment Pain Assessment: Faces Faces Pain Scale: Hurts even more Pain Location: bil knees and heels Pain Descriptors / Indicators:  Sore, Grimacing, Discomfort, Aching Pain Intervention(s): Limited activity within patient's tolerance, Monitored during session, Repositioned, RN gave pain meds during session    Home Living                          Prior Function            PT Goals (current goals can now be found in the care plan section) Acute Rehab PT Goals Patient Stated Goal: get rid of cough PT Goal Formulation: With patient/family Time For Goal Achievement: 02/18/23 Potential to Achieve Goals: Good Progress towards PT goals: Progressing toward goals    Frequency    Min 1X/week      PT Plan       Co-evaluation              AM-PAC PT "6 Clicks" Mobility   Outcome Measure  Help needed turning from your back to your side while in a flat bed without using bedrails?: A Little Help needed moving from lying on your back to sitting on the side of a flat bed without using bedrails?: A Little Help needed moving to and from a bed to a chair (including a wheelchair)?: A Little Help needed standing up from a chair using your arms (e.g., wheelchair or bedside chair)?: A Little Help needed to walk in hospital room?: A Little Help needed climbing 3-5 steps with a railing? : A Little 6 Click Score: 18    End of Session Equipment Utilized During Treatment: Gait belt Activity Tolerance: Patient limited by fatigue;Patient limited by pain Patient left: in chair;with call bell/phone within reach;with family/visitor present;with chair alarm set Nurse Communication: Mobility status PT Visit Diagnosis: Difficulty in walking, not elsewhere classified (R26.2)     Time: 8416-6063 PT Time Calculation (min) (ACUTE ONLY): 27 min  Charges:    $Gait Training: 23-37 mins PT General Charges $$ ACUTE PT VISIT: 1 Visit                     Keyari Kleeman, PT  Acute Rehab Dept [facility]) [phone]  02/06/2023    [facility] 02/06/2023, 1:18 PM

## 2023-02-06 NOTE — TOC Initial Note (Signed)
Transition of Care [facility]) - Initial/Assessment Note    Patient Details  Name: Mary [name] MRN: [id number] Date of Birth: 08/20/1926  Transition of Care [facility]) CM/SW Contact:    Larrie Kass, LCSW Phone Number: 02/06/2023, 3:24 PM  Clinical Narrative:                 CSW met with pt and her daughter at bedside to discus recommendations for home health services. CSW offered choice, pt's daughter Mary [name] chose [facility], who pt had in the past. CSW discussed rec for shower chair and informed them that it is not covered under medicare benefits. Pt's daughter reports she would like to private pay for shower chair. CSW requested [employer] rep to reach out to family about this.  [facility] was able to accepted pt for HHPT/OT and Aide services. HH orders will need to be placed, MD made aware.Pt's family will provided transportation home upon d/c. TOC to follow for d/c needs.   Expected Discharge Plan: Home w Home Health Services Barriers to Discharge: Continued Medical Work up   Patient Goals and CMS Choice Patient states their goals for this hospitalization and ongoing recovery are:: return home with home health CMS Medicare.gov Compare Post Acute Care list provided to:: Patient Represenative (must comment) Choice offered to / list presented to : Adult Children      Expected Discharge Plan and Services       Living arrangements for the past 2 months: Single Family Home                           HH Arranged: PT, OT, Nurse's Aide HH Agency: [facility] Date [facility] Agency Contacted: 02/06/23 Time HH Agency Contacted: 1523 Representative spoke with at [facility] Agency: Cindie  Prior Living Arrangements/Services Living arrangements for the past 2 months: Single Family Home Lives with:: Self Patient language and need for interpreter reviewed:: Yes Do you feel safe going back to the place where you live?: Yes      Need for Family Participation in Patient Care: Yes (Comment) Care  giver support system in place?: Yes (comment) Current home services: DME Criminal Activity/Legal Involvement Pertinent to Current Situation/Hospitalization: No - Comment as needed  Activities of Daily Living   ADL Screening (condition at time of admission) Independently performs ADLs?: No Does the patient have a NEW difficulty with bathing/dressing/toileting/self-feeding that is expected to last >3 days?: Yes (Initiates electronic notice to provider for possible OT consult) Does the patient have a NEW difficulty with getting in/out of bed, walking, or climbing stairs that is expected to last >3 days?: Yes (Initiates electronic notice to provider for possible PT consult) Does the patient have a NEW difficulty with communication that is expected to last >3 days?: No Is the patient deaf or have difficulty hearing?: Yes Does the patient have difficulty seeing, even when wearing glasses/contacts?: No Does the patient have difficulty concentrating, remembering, or making decisions?: No  Permission Sought/Granted                  Emotional Assessment Appearance:: Appears stated age Attitude/Demeanor/Rapport: Gracious Affect (typically observed): Accepting Orientation: : Oriented to  Time, Oriented to Place, Oriented to Self, Oriented to Situation   Psych Involvement: No (comment)  Admission diagnosis:  Hyponatremia [E87.1] Acute exacerbation of congestive heart failure (HCC) [I50.9] Constipation, unspecified constipation type [K59.00] Community acquired pneumonia, unspecified laterality [J18.9] CHF (congestive heart failure) (HCC) [I50.9] Patient Active Problem List  Diagnosis Date Noted   CHF (congestive heart failure) (HCC) 02/04/2023   Acute exacerbation of congestive heart failure (HCC) 02/03/2023   Chronic idiopathic constipation 02/03/2023   Paroxysmal atrial tachycardia (HCC) 02/03/2023   Presence of suprapubic catheter (HCC) 11/09/2022   Acute on chronic combined systolic  and diastolic CHF (congestive heart failure) (HCC) 11/01/2022   PAT (paroxysmal atrial tachycardia) (HCC) 11/01/2022   DCM (dilated cardiomyopathy) (HCC) 11/01/2022   CHF exacerbation (HCC) 10/31/2022   Heart failure with mid-range ejection fraction (HFmEF) (HCC) 10/30/2022   Dyspnea on exertion 10/30/2022   Pleural effusion, bilateral 10/30/2022   New onset of congestive heart failure (HCC) 10/30/2022   Acute UTI (urinary tract infection) 03/21/2022   URI (upper respiratory infection) 03/21/2022   Hyponatremia 03/21/2022   Hypocalcemia 03/21/2022   Asymptomatic bacteriuria 03/21/2022   Sinus arrhythmia 03/21/2022   Bladder stones 08/17/2021   Presbycusis of both ears 04/12/2021   Postnasal drip 03/01/2017   Abnormal auditory perception of left ear 12/09/2015   Bilateral impacted cerumen 11/26/2015   Ashkenazi Jewish ancestry    OA (osteoarthritis) of knee 01/24/2013   Osteoporosis 06/01/2011   Malignant neoplasm of upper-outer quadrant of right breast in female, estrogen receptor positive (HCC) 04/03/2011   Primary hypertension 12/01/2010   Mixed hyperlipidemia 10/19/2010   Palpitations 10/19/2010   Atony of bladder 10/19/2010   PCP:  Joselyn Arrow, MD Pharmacy:   [facility] [id number] [place], [place] - [address] AT [facility] [address] [facility] [address] [place] [address] Phone: [phone] Fax: [phone]     Social Determinants of Health (SDOH) Social History: SDOH Screenings   Food Insecurity: No Food Insecurity (02/04/2023)  Housing: Low Risk  (02/04/2023)  Transportation Needs: No Transportation Needs (02/04/2023)  Utilities: Not At Risk (02/04/2023)  Depression (PHQ2-9): Low Risk  (07/26/2022)  Tobacco Use: Low Risk  (02/04/2023)   SDOH Interventions:     Readmission Risk Interventions     No data to display

## 2023-02-06 NOTE — Progress Notes (Signed)
Started at 20:48, patient's HR elevate to 150-170's, while sitting at the recliner and talking to a friend over the phone and also when she transferred to bed. Patient complained she was upset, spasms and short of breath. Tele called regarding irregular cardiac rhythms. Johann Capers NP/[facility] was made aware. RN stayed at bedside to calm patient and gave PRN Valium 2mg , oxygen 2L. At 23:30, patient was calm, asleep and HR=90-110's with sporadic 160's but non-sustaining.

## 2023-02-06 NOTE — Progress Notes (Signed)
   Patient Name: Mary [name] Date of Encounter: 02/06/2023 [facility] Cardiologist: Nanetta Batty, MD   Interval Summary  .    Continues to have a dry, non-productive cough.  Otherwise well.  Vital Signs .    Vitals:   02/05/23 1200 02/05/23 1340 02/05/23 2003 02/06/23 0409  BP: (!) 104/56  (!) 150/85 136/75  Pulse: 89  (!) 50 86  Resp: 16 20 15 15   Temp: 98.3 F (36.8 C)  97.7 F (36.5 C) 99 F (37.2 C)  TempSrc: Oral  Oral Oral  SpO2: 91%  95% 93%  Weight:    51.9 kg  Height:        Intake/Output Summary (Last 24 hours) at 02/06/2023 1249 Last data filed at 02/06/2023 0030 Gross per 24 hour  Intake --  Output 1525 ml  Net -1525 ml      02/06/2023    4:09 AM 02/05/2023    5:00 AM 02/04/2023    5:00 AM  Last 3 Weights  Weight (lbs) 114 lb 6.7 oz 116 lb 6.5 oz 118 lb 2.7 oz  Weight (kg) 51.9 kg 52.8 kg 53.6 kg      Telemetry/ECG    Sinus rhythm.  PACs.  SVT up to 140s - Personally Reviewed  Physical Exam .    VS:  BP 136/75   Pulse 86   Temp 99 F (37.2 C) (Oral)   Resp 15   Ht 5\' 3"  (1.6 m)   Wt 51.9 kg   LMP  (LMP Unknown)   SpO2 93%   BMI 20.27 kg/m  , BMI Body mass index is 20.27 kg/m. GENERAL:  Well appearing HEENT: Pupils equal round and reactive, fundi not visualized, oral mucosa unremarkable NECK:  No jugular venous distention, waveform within normal limits, carotid upstroke brisk and symmetric, no bruits, no thyromegaly LUNGS: Mild bibasilar crackles improved from yesterday. HEART:  RRR.  PMI not displaced or sustained,S1 and S2 within normal limits, no S3, no S4, no clicks, no rubs, no murmurs ABD:  Flat, positive bowel sounds normal in frequency in pitch, no bruits, no rebound, no guarding, no midline pulsatile mass, no hepatomegaly, no splenomegaly EXT:  2 plus pulses throughout, no edema, no cyanosis no clubbing SKIN:  No rashes no nodules NEURO:  Cranial nerves II through XII grossly intact, motor grossly intact  throughout PSYCH:  Cognitively intact, oriented to person place and time   Assessment & Plan .     Ms. Hockley is a 66F with HFmrEF, atrial tachycardia, and hypertension admitted with shortness of breath and nausea thought to be due to acute on chronic heart failure.     # HFmrEF:  # Hypertension:  LVEF 45-50%.  Increased diuresis 11/18. She is net -1.9L in the last 24 hours.  Weight improving.  Renal function is stable.  Will give 1 more dose of IV Lasix today and plan to transition to oral tomorrow.  Continue metoprolol, spironolactone and Entresto.   She has post nasal drip which is likely the cause of her non-productive cough. Resume home loratadine.  # Atrial tachycardia:  Continue metoprolol.  She has had some runs of atrial tachycardia.  Will increase metoprolol to 50 mg.  # Hyperlipidemia:  Continue atorvastatin.   For questions or updates, please contact [facility] Please consult [email or web address] for contact info under        Signed, Chilton Si, MD

## 2023-02-06 NOTE — Plan of Care (Signed)

## 2023-02-07 ENCOUNTER — Inpatient Hospital Stay (HOSPITAL_COMMUNITY): Payer: Medicare Other

## 2023-02-07 ENCOUNTER — Encounter: Payer: Medicare Other | Admitting: Family Medicine

## 2023-02-07 DIAGNOSIS — E871 Hypo-osmolality and hyponatremia: Secondary | ICD-10-CM | POA: Diagnosis not present

## 2023-02-07 DIAGNOSIS — I4719 Other supraventricular tachycardia: Secondary | ICD-10-CM | POA: Diagnosis not present

## 2023-02-07 DIAGNOSIS — I5043 Acute on chronic combined systolic (congestive) and diastolic (congestive) heart failure: Secondary | ICD-10-CM | POA: Diagnosis not present

## 2023-02-07 LAB — RESPIRATORY PANEL BY PCR

## 2023-02-07 LAB — MAGNESIUM: Magnesium: 1.9 mg/dL (ref 1.7–2.4)

## 2023-02-07 LAB — CBC
HCT: 38.3 % (ref 36.0–46.0)
Hemoglobin: 12.7 g/dL (ref 12.0–15.0)
MCH: 28.7 pg (ref 26.0–34.0)
MCHC: 33.2 g/dL (ref 30.0–36.0)
MCV: 86.5 fL (ref 80.0–100.0)
Platelets: 405 10*3/uL — ABNORMAL HIGH (ref 150–400)
RBC: 4.43 MIL/uL (ref 3.87–5.11)
RDW: 15.3 % (ref 11.5–15.5)
WBC: 9.8 10*3/uL (ref 4.0–10.5)
nRBC: 0 % (ref 0.0–0.2)

## 2023-02-07 LAB — BASIC METABOLIC PANEL
Anion gap: 12 (ref 5–15)
BUN: 21 mg/dL (ref 8–23)
CO2: 29 mmol/L (ref 22–32)
Calcium: 9 mg/dL (ref 8.9–10.3)
Chloride: 87 mmol/L — ABNORMAL LOW (ref 98–111)
Creatinine, Ser: 0.91 mg/dL (ref 0.44–1.00)
GFR, Estimated: 58 mL/min — ABNORMAL LOW (ref >60)
Glucose, Bld: 128 mg/dL — ABNORMAL HIGH (ref 70–99)
Potassium: 3.6 mmol/L (ref 3.5–5.1)
Sodium: 128 mmol/L — ABNORMAL LOW (ref 135–145)

## 2023-02-07 LAB — GLUCOSE, CAPILLARY: Glucose-Capillary: 109 mg/dL — ABNORMAL HIGH (ref 70–99)

## 2023-02-07 LAB — SARS CORONAVIRUS 2 BY RT PCR: SARS Coronavirus 2 by RT PCR: NEGATIVE

## 2023-02-07 MED ORDER — PROCHLORPERAZINE EDISYLATE 10 MG/2ML IJ SOLN
10.0000 mg | Freq: Four times a day (QID) | INTRAMUSCULAR | Status: DC | PRN
  Administered 2023-02-07: 10 mg via INTRAVENOUS
  Filled 2023-02-07: qty 2

## 2023-02-07 NOTE — Plan of Care (Signed)
  Problem: Education: Goal: Knowledge of General Education information will improve Description: Including pain rating scale, medication(s)/side effects and non-pharmacologic comfort measures Outcome: Progressing   Problem: Nutrition: Goal: Adequate nutrition will be maintained Outcome: Progressing   Problem: Coping: Goal: Level of anxiety will decrease Outcome: Progressing   Problem: Pain Management: Goal: General experience of comfort will improve Outcome: Progressing   Problem: Safety: Goal: Ability to remain free from injury will improve Outcome: Progressing   Problem: Skin Integrity: Goal: Risk for impaired skin integrity will decrease Outcome: Progressing

## 2023-02-07 NOTE — Progress Notes (Addendum)
Progress Note  Patient Name: Mary [name] Date of Encounter: 02/07/2023  Primary Cardiologist: Nanetta Batty, MD  Subjective   Continues with dry hacky cough. BP trending softer this AM. HR remains 100-1teens with occasional brief bursts to the 190s.  Inpatient Medications    Scheduled Meds:  atorvastatin  40 mg Oral QPM   cholecalciferol  2,000 Units Oral Daily   diclofenac Sodium  4 g Topical QID   enoxaparin (LOVENOX) injection  40 mg Subcutaneous Q24H   furosemide  20 mg Oral Daily   lidocaine  1 patch Transdermal Daily   loratadine  10 mg Oral Daily   metoprolol succinate  50 mg Oral Daily   sacubitril-valsartan  1 tablet Oral BID   senna-docusate  1 tablet Oral QHS   spironolactone  12.5 mg Oral Daily   Continuous Infusions:  PRN Meds: acetaminophen **OR** acetaminophen, bisacodyl, diazepam, guaiFENesin, melatonin, ondansetron, mouth rinse, polyvinyl alcohol, sodium chloride, traMADol   Vital Signs    Vitals:   02/06/23 1603 02/06/23 1618 02/06/23 2225 02/07/23 0452  BP:  (!) 107/50 (!) 144/104 120/86  Pulse:  91 85 83  Resp: 17 18 20 20   Temp:  (!) 97.5 F (36.4 C) 97.6 F (36.4 C)   TempSrc:  Oral    SpO2:  96% 95% 92%  Weight:    54.9 kg  Height:        Intake/Output Summary (Last 24 hours) at 02/07/2023 1036 Last data filed at 02/07/2023 0622 Gross per 24 hour  Intake 660 ml  Output 1820 ml  Net -1160 ml      02/07/2023    4:52 AM 02/06/2023    4:09 AM 02/05/2023    5:00 AM  Last 3 Weights  Weight (lbs) 121 lb 0.5 oz 114 lb 6.7 oz 116 lb 6.5 oz  Weight (kg) 54.9 kg 51.9 kg 52.8 kg     Telemetry    Suspected MAT HR 110s with fairly frequent bursts of SVT/A-Tach 190s - Personally Reviewed  Physical Exam   GEN: Frail elderly WF. HEENT: Normocephalic, atraumatic, sclera non-icteric. Neck: No JVD or bruits. Cardiac: RRR no murmurs, rubs, or gallops.  Respiratory: Diffusely diminished throughout. Frequent dry hacky coughing. GI: Soft,  nontender, non-distended, BS +x 4. MS: no deformity. Extremities: No clubbing or cyanosis. No edema. Generalized erythema of all toes Neuro:  AAOx3. Follows commands. Psych:  Responds to questions appropriately with a normal affect.  Labs    High Sensitivity Troponin:  No results for input(s): "TROPONINIHS" in the last 720 hours.    Cardiac EnzymesNo results for input(s): "TROPONINI" in the last 168 hours. No results for input(s): "TROPIPOC" in the last 168 hours.   Chemistry Recent Labs  Lab 02/03/23 1656 02/03/23 2244 02/04/23 0450 02/05/23 0400 02/06/23 0356 02/07/23 0429  NA 126*   < > 132* 131* 128* 128*  K 3.7  --  3.6 3.7 3.5 3.6  CL 93*  --  92* 93* 89* 87*  CO2 26  --  30 28 28 29   GLUCOSE 99  --  96 117* 123* 128*  BUN 14  --  13 17 18 21   CREATININE 0.55  --  0.68 0.63 0.71 0.91  CALCIUM 8.3*  --  8.8* 9.1 8.6* 9.0  PROT 6.2*  --  6.6  --   --   --   ALBUMIN 3.0*  --  3.1*  --   --   --   AST 21  --  20  --   --   --  ALT 25  --  25  --   --   --   ALKPHOS 111  --  116  --   --   --   BILITOT 0.7  --  0.7  --   --   --   GFRNONAA >60  --  >60 >60 >60 58*  ANIONGAP 7  --  10 10 11 12    < > = values in this interval not displayed.     Hematology Recent Labs  Lab 02/05/23 0400 02/06/23 0356 02/07/23 0429  WBC 10.1 9.0 9.8  RBC 4.31 4.27 4.43  HGB 12.2 12.2 12.7  HCT 38.3 37.2 38.3  MCV 88.9 87.1 86.5  MCH 28.3 28.6 28.7  MCHC 31.9 32.8 33.2  RDW 15.1 15.2 15.3  PLT 338 351 405*    BNP Recent Labs  Lab 02/03/23 2244  BNP 969.8*     DDimer No results for input(s): "DDIMER" in the last 168 hours.   Radiology    No results found.  Cardiac Studies   2d echo 02/04/23    1. Left ventricular ejection fraction, by estimation, is 45 to 50%. The  left ventricle has mildly decreased function. The left ventricle  demonstrates global hypokinesis. Left ventricular diastolic function could  not be evaluated.   2. Right ventricular systolic  function is normal. The right ventricular  size is not well visualized. There is mildly elevated pulmonary artery  systolic pressure.   3. Left atrial size was moderately dilated.   4. The mitral valve is degenerative. Trivial mitral valve regurgitation.  No evidence of mitral stenosis. Severe mitral annular calcification.   5. The aortic valve is tricuspid. There is mild calcification of the  aortic valve. There is mild thickening of the aortic valve. Aortic valve  regurgitation is not visualized. Aortic valve sclerosis/calcification is  present, without any evidence of  aortic stenosis.   Comparison(s): No significant change from prior study.   Patient Profile     89 y.o. female wth HFmrEF dx by echo 10/2022 (EF 45%), HTN, paroxysmal atrial tachycardia, remote breast CA, kidney stones, neurogenic bladder s/p recent cystoscopy with holmium laser cystolitholopaxy and suprapubic tube exchange, urinary retention. Admitted with persistent dry cough, shortness of breath, and nausea. Felt to have a/c HF.  Assessment & Plan    1. Acute on chronic HFmrEF, HTN - tx with IV Lasix, transitioned to oral today, though still coughing and tachycardic - currently on Entresto 24/26mg  BID (transitioned from home ARB), spironolactone 12.5mg  daily, Toprol 50mg  daily -> awaiting f/u manual BP this AM to guide med recommendations, already received Entresto/spiro today. Nurse will hold metoprolol pending trend and MD review. - avoid SGLT2i with urinary issues - she is net -5.3L, weights variable by EMR, ? 7lb increase today, discordant with I/O's - will discuss with MD perhaps another pulm issue going on?  2. Cough, worsening hyponatremia - CT 02/03/23 showed bilateral pleural effusions, nodular consolidation within the lingula, which may reflect localized inflammation or infection, recommended for f/u imaging to document resolution and exclude parenchymal lung nodules - ?consider additional non cardiac  management per primary team  3. Paroxysmal atrial tachycardia (occ HR 190s), also suspect MAT when HR 110s - will review mgmt with MD given softer BP this AM - TSH OK  4. Hyperlipidemia - on atorvastatin at home followed by PCP  For questions or updates, please contact  [facility] Please consult [email or web address] for contact info under Cardiology/STEMI.  Signed, Laurann Montana,  PA-C 02/07/2023, 10:36 AM

## 2023-02-07 NOTE — Progress Notes (Signed)
Notified MD about patient being really drowsy after giving PRN IV Compazine even though it did help with patient's nausea. MD was also made aware that patient told RN that she felt weak, which patient is not eating much. Nursing staff tried to get patient up to walk earlier during shift but she could only walk to the door.

## 2023-02-07 NOTE — Progress Notes (Signed)
Progress Note    Mary [name]   WGN:[id number]  DOB: 10-30-1926  DOA: 02/03/2023     3 PCP: Joselyn Arrow, MD  Initial CC: cough, SOB  Hospital Course: Mary [name] is a 89 y.o. female with medical history significant of systolic heart failure (EF 45%), paroxysmal atrial tachycardia, HTN, history of invasive breast carcinoma s/p right total mastectomy (2007), presented to the hospital with 2 to 3-week history of worsening dry cough shortness of breath and nausea.  History of admission in August 2024 for systolic heart failure with bilateral pleural effusion.  Patient subsequently followed Dr. Allyson Sabal with [facility] cardiology and was placed on metoprolol succinate, spironolactone, losartan, and advised to take Lasix 10 mg 3 times a week.  Patient lives alone but does have assistance at home. her dry weight is about 116 pounds.  In the ED patient had mild leukocytosis and BMP showed mild hyponatremia.  Chest x-ray showed bilateral pleural effusion.  CT abdomen pelvis showing bilateral pleural effusions along with a nodular consolidation within the lingula.  Patient was given Rocephin and patient was admitted hospital for further evaluation and treatment.   Assessment and plan.  Acute on chronic systolic heart failure exacerbation Review of previous 2D echocardiogram from 10/31/2022 showed  LVEF 45% with mildly decreased LV function, severely dilated left atrium, severe mitral annular calcification.   - Imaging showed effusion; on exam she was noted to have LE edema.   BNP elevated.   TSH at 2.6.   - cardiology following, appreciate assistance; diuresis per cardiology  - further GDMT also being started    Lingular opacity? Nodular consolidation within the lingula noted on CT abdomen pelvis.   -Very brief mild leukocytosis on admission which normalized and remained normal after antibiotic discontinuation - Has remained afebrile and nontoxic-appearing - CXR repeated 02/07/2023 showing small/increased  bilateral pleural effusions and mild bibasilar atelectasis -Continue holding off on antibiotics - Follow-up RVP.  COVID-negative  Moderate Hyponatremia Likely secondary to hypervolemic hyponatremia.   -Monitor while on diuresis   Atrial premature complexes Paroxysmal atrial tachycardia RBBB Patient with known history of RBBB and paroxysmal atrial tachycardia.  Had episode of of SVT.  Continue metoprolol.    Constipation Noted on CT scan.  Continue MiraLAX and Senokot   Hypertension -Continue Toprol and spironolactone - ARB now transitioned to Entresto  OA of right knee Continue Voltaren gel.   Restless Leg Syndrome Off gabapentin due to nightmares.   History of invasive mammary carcinoma Status post total right mastectomy in 02/2006.    Interval History:  Was actually feeling okay and wanted to go home, but still having some atrial tachycardia and soft BP this morning.  Daughter present bedside.    Old records reviewed in assessment of this patient  Antimicrobials:   DVT prophylaxis:  enoxaparin (LOVENOX) injection 40 mg Start: 02/03/23 2200   Code Status:   Code Status: Limited: Do not attempt resuscitation (DNR) -DNR-LIMITED -Do Not Intubate/DNI   Mobility Assessment (Last 72 Hours)     Mobility Assessment     Row Name 02/07/23 1015 02/06/23 2021 02/06/23 0830 02/06/23 0045 02/05/23 2125   Does patient have an order for bedrest or is patient medically unstable No - Continue assessment No - Continue assessment No - Continue assessment No - Continue assessment No - Continue assessment   What is the highest level of mobility based on the progressive mobility assessment? Level 5 (Walks with assist in room/hall) - Balance while stepping forward/back and can walk in  room with assist - Complete Level 5 (Walks with assist in room/hall) - Balance while stepping forward/back and can walk in room with assist - Complete Level 5 (Walks with assist in room/hall) - Balance  while stepping forward/back and can walk in room with assist - Complete Level 4 (Walks with assist in room) - Balance while marching in place and cannot step forward and back - Complete Level 4 (Walks with assist in room) - Balance while marching in place and cannot step forward and back - Complete    Row Name 02/05/23 0830 02/04/23 2200         Does patient have an order for bedrest or is patient medically unstable No - Continue assessment No - Continue assessment      What is the highest level of mobility based on the progressive mobility assessment? Level 5 (Walks with assist in room/hall) - Balance while stepping forward/back and can walk in room with assist - Complete Level 5 (Walks with assist in room/hall) - Balance while stepping forward/back and can walk in room with assist - Complete               Barriers to discharge: none Disposition Plan:  Home Status is: Inpt  Objective: Blood pressure (!) 114/59, pulse 84, temperature 97.9 F (36.6 C), temperature source Oral, resp. rate 16, height 5\' 3"  (1.6 m), weight 54.9 kg, SpO2 96%.  Examination:  Physical Exam Constitutional:      Appearance: Normal appearance.  HENT:     Head: Normocephalic and atraumatic.     Mouth/Throat:     Mouth: Mucous membranes are moist.  Eyes:     Extraocular Movements: Extraocular movements intact.  Cardiovascular:     Rate and Rhythm: Normal rate and regular rhythm.  Pulmonary:     Effort: Pulmonary effort is normal. No respiratory distress.     Breath sounds: Normal breath sounds. No wheezing.  Abdominal:     General: Bowel sounds are normal. There is no distension.     Palpations: Abdomen is soft.     Tenderness: There is no abdominal tenderness.  Musculoskeletal:        General: Normal range of motion.     Cervical back: Normal range of motion and neck supple.  Skin:    General: Skin is warm and dry.  Neurological:     General: No focal deficit present.     Mental Status: She is  alert.  Psychiatric:        Mood and Affect: Mood normal.      Consultants:  Cardiology  Procedures:    Data Reviewed: Results for orders placed or performed during the hospital encounter of 02/03/23 (from the past 24 hour(s))  Basic metabolic panel     Status: Abnormal   Collection Time: 02/07/23  4:29 AM  Result Value Ref Range   Sodium 128 (L) 135 - 145 mmol/L   Potassium 3.6 3.5 - 5.1 mmol/L   Chloride 87 (L) 98 - 111 mmol/L   CO2 29 22 - 32 mmol/L   Glucose, Bld 128 (H) 70 - 99 mg/dL   BUN 21 8 - 23 mg/dL   Creatinine, Ser 7.82 0.44 - 1.00 mg/dL   Calcium 9.0 8.9 - 95.6 mg/dL   GFR, Estimated 58 (L) >60 mL/min   Anion gap 12 5 - 15  CBC     Status: Abnormal   Collection Time: 02/07/23  4:29 AM  Result Value Ref Range   WBC 9.8  4.0 - 10.5 K/uL   RBC 4.43 3.87 - 5.11 MIL/uL   Hemoglobin 12.7 12.0 - 15.0 g/dL   HCT 06.2 37.6 - 28.3 %   MCV 86.5 80.0 - 100.0 fL   MCH 28.7 26.0 - 34.0 pg   MCHC 33.2 30.0 - 36.0 g/dL   RDW 15.1 76.1 - 60.7 %   Platelets 405 (H) 150 - 400 K/uL   nRBC 0.0 0.0 - 0.2 %  Magnesium     Status: None   Collection Time: 02/07/23  4:29 AM  Result Value Ref Range   Magnesium 1.9 1.7 - 2.4 mg/dL  Glucose, capillary     Status: Abnormal   Collection Time: 02/07/23  7:29 AM  Result Value Ref Range   Glucose-Capillary 109 (H) 70 - 99 mg/dL  SARS Coronavirus 2 by RT PCR (hospital order, performed in [facility] hospital lab) *cepheid single result test* Anterior Nasal Swab     Status: None   Collection Time: 02/07/23  1:50 PM   Specimen: Anterior Nasal Swab  Result Value Ref Range   SARS Coronavirus 2 by RT PCR NEGATIVE NEGATIVE    I have reviewed pertinent nursing notes, vitals, labs, and images as necessary. I have ordered labwork to follow up on as indicated.  I have reviewed the last notes from staff over past 24 hours. I have discussed patient's care plan and test results with nursing staff, CM/SW, and other staff as appropriate.  Time  spent: Greater than 50% of the 55 minute visit was spent in counseling/coordination of care for the patient as laid out in the A&P.   LOS: 3 days   Lewie Chamber, MD [facility] 02/07/2023, 5:11 PM

## 2023-02-07 NOTE — Plan of Care (Signed)
  Problem: Clinical Measurements: Goal: Diagnostic test results will improve Outcome: Not Progressing Goal: Respiratory complications will improve Outcome: Not Progressing Goal: Cardiovascular complication will be avoided Outcome: Not Progressing

## 2023-02-08 ENCOUNTER — Inpatient Hospital Stay: Payer: Medicare Other

## 2023-02-08 ENCOUNTER — Other Ambulatory Visit: Payer: Self-pay

## 2023-02-08 DIAGNOSIS — Z170 Estrogen receptor positive status [ER+]: Secondary | ICD-10-CM

## 2023-02-08 DIAGNOSIS — E871 Hypo-osmolality and hyponatremia: Secondary | ICD-10-CM | POA: Diagnosis not present

## 2023-02-08 DIAGNOSIS — I4719 Other supraventricular tachycardia: Secondary | ICD-10-CM | POA: Diagnosis not present

## 2023-02-08 DIAGNOSIS — I5043 Acute on chronic combined systolic (congestive) and diastolic (congestive) heart failure: Secondary | ICD-10-CM | POA: Diagnosis not present

## 2023-02-08 LAB — CBC
HCT: 40.4 % (ref 36.0–46.0)
Hemoglobin: 12.8 g/dL (ref 12.0–15.0)
MCH: 27.7 pg (ref 26.0–34.0)
MCHC: 31.7 g/dL (ref 30.0–36.0)
MCV: 87.4 fL (ref 80.0–100.0)
Platelets: 402 10*3/uL — ABNORMAL HIGH (ref 150–400)
RBC: 4.62 MIL/uL (ref 3.87–5.11)
RDW: 15.2 % (ref 11.5–15.5)
WBC: 7.3 10*3/uL (ref 4.0–10.5)
nRBC: 0 % (ref 0.0–0.2)

## 2023-02-08 LAB — BASIC METABOLIC PANEL
Anion gap: 11 (ref 5–15)
BUN: 24 mg/dL — ABNORMAL HIGH (ref 8–23)
CO2: 32 mmol/L (ref 22–32)
Calcium: 8.9 mg/dL (ref 8.9–10.3)
Chloride: 83 mmol/L — ABNORMAL LOW (ref 98–111)
Creatinine, Ser: 1.08 mg/dL — ABNORMAL HIGH (ref 0.44–1.00)
GFR, Estimated: 48 mL/min — ABNORMAL LOW (ref >60)
Glucose, Bld: 126 mg/dL — ABNORMAL HIGH (ref 70–99)
Potassium: 4 mmol/L (ref 3.5–5.1)
Sodium: 126 mmol/L — ABNORMAL LOW (ref 135–145)

## 2023-02-08 LAB — BRAIN NATRIURETIC PEPTIDE: B Natriuretic Peptide: 420.1 pg/mL — ABNORMAL HIGH (ref 0.0–100.0)

## 2023-02-08 LAB — GLUCOSE, CAPILLARY: Glucose-Capillary: 111 mg/dL — ABNORMAL HIGH (ref 70–99)

## 2023-02-08 MED ORDER — METOPROLOL SUCCINATE ER 50 MG PO TB24: 50.0000 mg | ORAL_TABLET | Freq: Every day | ORAL | 3 refills | Status: DC

## 2023-02-08 MED ORDER — ENOXAPARIN SODIUM 30 MG/0.3ML IJ SOSY: 30.0000 mg | PREFILLED_SYRINGE | INTRAMUSCULAR | Status: DC

## 2023-02-08 MED ORDER — FUROSEMIDE 20 MG PO TABS: 20.0000 mg | ORAL_TABLET | ORAL | 1 refills | Status: DC

## 2023-02-08 NOTE — Progress Notes (Signed)
Occupational Therapy Treatment Patient Details Name: Mary [name] MRN: [id number] DOB: September 24, 1926 Today's Date: 02/08/2023   History of present illness 89 y.o. female with presented to the hospital with 2 to 3-week history of worsening dry cough shortness of breath and nausea and admitted for acute on chronic systolic heart failure exacerbation.  History of admission in August 2024 for systolic heart failure with bilateral pleural effusion. Past medical history significant of systolic heart failure (EF 45%), paroxysmal atrial tachycardia, HTN, history of invasive breast carcinoma s/p right total mastectomy (2007), chronic R hip/groin pain, suprapubic catheter   OT comments  Patient agreeable to OOB and mobility in room/toileting and grooming.  Patient with fair progress toward patient focused goals.  Patient needing Mod A for bed mobility, Min A to stand and generalized supervision for stand grooming.  OT will continue efforts in the acute setting to address deficits, and HH OT can be considered, but patient does have 24 hour care for ADL/iADL, and may be close to baseline.        If plan is discharge home, recommend the following:  Assist for transportation;Help with stairs or ramp for entrance;Assistance with cooking/housework;A lot of help with bathing/dressing/bathroom   Equipment Recommendations       Recommendations for Other Services      Precautions / Restrictions Precautions Precautions: Fall Restrictions Weight Bearing Restrictions: No       Mobility Bed Mobility Overal bed mobility: Needs Assistance Bed Mobility: Supine to Sit     Supine to sit: Mod assist          Transfers Overall transfer level: Needs assistance Equipment used: Rolling walker (2 wheels) Transfers: Sit to/from Stand, Bed to chair/wheelchair/BSC Sit to Stand: Min assist     Step pivot transfers: Contact guard assist           Balance Overall balance assessment: Needs  assistance Sitting-balance support: Feet supported Sitting balance-Leahy Scale: Fair     Standing balance support: Reliant on assistive device for balance Standing balance-Leahy Scale: Poor                             ADL either performed or assessed with clinical judgement   ADL       Grooming: Supervision/safety;Standing           Upper Body Dressing : Minimal assistance;Sitting   Lower Body Dressing: Moderate assistance;Sit to/from stand   Toilet Transfer: Minimal assistance;Rolling walker (2 wheels);Regular Toilet;Ambulation;Contact guard assist Toilet Transfer Details (indicate cue type and reason): sit to stand is difficult                Extremity/Trunk Assessment Upper Extremity Assessment Upper Extremity Assessment: Generalized weakness   Lower Extremity Assessment Lower Extremity Assessment: Defer to PT evaluation        Vision Patient Visual Report: No change from baseline     Perception Perception Perception: Not tested   Praxis Praxis Praxis: Not tested    Cognition Arousal: Alert Behavior During Therapy: [facility] for tasks assessed/performed Overall Cognitive Status: Within Functional Limits for tasks assessed                                                             Pertinent Vitals/ Pain  Pain Assessment Pain Assessment: Faces Faces Pain Scale: Hurts little more Pain Location: bil knees and heels Pain Descriptors / Indicators: Aching, Sore Pain Intervention(s): Monitored during session                                                          Frequency  Min 1X/week        Progress Toward Goals  OT Goals(current goals can now be found in the care plan section)  Progress towards OT goals: Progressing toward goals  Acute Rehab OT Goals OT Goal Formulation: With patient/family Time For Goal Achievement: 02/18/23 Potential to Achieve Goals: Good  Plan       Co-evaluation                 AM-PAC OT "6 Clicks" Daily Activity     Outcome Measure   Help from another person eating meals?: None Help from another person taking care of personal grooming?: A Little Help from another person toileting, which includes using toliet, bedpan, or urinal?: A Lot Help from another person bathing (including washing, rinsing, drying)?: A Lot Help from another person to put on and taking off regular upper body clothing?: A Little Help from another person to put on and taking off regular lower body clothing?: A Lot 6 Click Score: 16    End of Session Equipment Utilized During Treatment: Rolling walker (2 wheels)  OT Visit Diagnosis: Unsteadiness on feet (R26.81);Muscle weakness (generalized) (M62.81);Pain;Other abnormalities of gait and mobility (R26.89) Pain - Right/Left: Left Pain - part of body: Knee;Leg   Activity Tolerance Patient tolerated treatment well   Patient Left in chair;with call bell/phone within reach;with family/visitor present   Nurse Communication          Time: 1610-9604 OT Time Calculation (min): 17 min  Charges: OT General Charges $OT Visit: 1 Visit OT Treatments $Self Care/Home Management : 8-22 mins  02/08/2023  RP, OTR/L  Acute Rehabilitation Services  Office:  [phone]   Suzanna Obey 02/08/2023, 10:41 AM

## 2023-02-08 NOTE — Plan of Care (Signed)

## 2023-02-08 NOTE — Progress Notes (Signed)
Patient Name: Mary [name] Date of Encounter: 02/08/2023 [facility] Cardiologist: Mary Batty, MD   Interval Summary  .    Continues to have a dry, non-productive cough.  Otherwise well. Eager to go home.   Vital Signs .    Vitals:   02/07/23 2122 02/08/23 0506 02/08/23 0525 02/08/23 0926  BP: (!) 109/59 129/78  105/63  Pulse:  (!) 39  73  Resp:  (!) 24    Temp:  97.7 F (36.5 C)    TempSrc:  Oral    SpO2: 94% 98%    Weight:   54.6 kg   Height:        Intake/Output Summary (Last 24 hours) at 02/08/2023 1257 Last data filed at 02/08/2023 1035 Gross per 24 hour  Intake 60 ml  Output 1100 ml  Net -1040 ml      02/08/2023    5:25 AM 02/07/2023    4:52 AM 02/06/2023    4:09 AM  Last 3 Weights  Weight (lbs) 120 lb 5.9 oz 121 lb 0.5 oz 114 lb 6.7 oz  Weight (kg) 54.6 kg 54.9 kg 51.9 kg      Telemetry/ECG    Sinus rhythm.  PACs.  SVT up to 140s - Personally Reviewed  Physical Exam .    VS:  BP 105/63   Pulse 73   Temp 97.7 F (36.5 C) (Oral)   Resp (!) 24   Ht 5\' 3"  (1.6 m)   Wt 54.6 kg   LMP  (LMP Unknown)   SpO2 98%   BMI 21.32 kg/m  , BMI Body mass index is 21.32 kg/m. GENERAL:  Well appearing HEENT: Pupils equal round and reactive, fundi not visualized, oral mucosa unremarkable NECK:  No jugular venous distention, waveform within normal limits, carotid upstroke brisk and symmetric, no bruits, no thyromegaly LUNGS: Diminished at base HEART:  RRR.  PMI not displaced or sustained,S1 and S2 within normal limits, no S3, no S4, no clicks, no rubs, no murmurs ABD:  Flat, positive bowel sounds normal in frequency in pitch, no bruits, no rebound, no guarding, no midline pulsatile mass, no hepatomegaly, no splenomegaly EXT:  2 plus pulses throughout, no edema, no cyanosis no clubbing SKIN:  No rashes no nodules NEURO:  Cranial nerves II through XII grossly intact, motor grossly intact throughout PSYCH:  Cognitively intact, oriented to person  place and time   Assessment & Plan .     Ms. Mary [name] is a 78F with HFmrEF, atrial tachycardia, and hypertension admitted with shortness of breath and nausea thought to be due to acute on chronic heart failure.     # HFmrEF:  # Hypertension:  LVEF 45-50%.  She was net -6.1 L.  Volume status appears stable.  Will increase her home dose of Lasix from 10 mg Monday, Wednesday, Friday to 20 mg on the same days.  Recommended to her and her caregivers that she weigh daily.  If we goes up by 2 pounds in a day or 5 pounds in the course of a week, she will need to call our office and likely take an additional dose.  Blood pressure stable on spironolactone.  Her home Sherryll Burger has been held.  Recommend checking a BMP at follow-up and tracking blood pressure at home.  If stable, would resume Entresto as an outpatient.  # Atrial tachycardia:  Continue metoprolol.  She has had some runs of atrial tachycardia.  Will increase metoprolol to 50 mg.  # Hyperlipidemia:  Continue atorvastatin.   [facility] will sign off.   Medication Recommendations:  Resume Entresto as outpatient if renal function allows Other recommendations (labs, testing, etc):  Check BMP at follow up.  Track weight and BP daily Follow up as an outpatient:  we will arrange.   For questions or updates, please contact [facility] Please consult [email or web address] for contact info under        Signed, Mary Si, MD

## 2023-02-08 NOTE — Plan of Care (Signed)
  Problem: Education: Goal: Knowledge of General Education information will improve Description: Including pain rating scale, medication(s)/side effects and non-pharmacologic comfort measures Outcome: Completed/Met   Problem: Health Behavior/Discharge Planning: Goal: Ability to manage health-related needs will improve Outcome: Completed/Met   Problem: Clinical Measurements: Goal: Ability to maintain clinical measurements within normal limits will improve Outcome: Completed/Met Goal: Will remain free from infection Outcome: Completed/Met Goal: Diagnostic test results will improve Outcome: Completed/Met Goal: Respiratory complications will improve Outcome: Completed/Met Goal: Cardiovascular complication will be avoided Outcome: Completed/Met   Problem: Activity: Goal: Risk for activity intolerance will decrease Outcome: Completed/Met   Problem: Nutrition: Goal: Adequate nutrition will be maintained Outcome: Completed/Met   Problem: Coping: Goal: Level of anxiety will decrease Outcome: Completed/Met   Problem: Elimination: Goal: Will not experience complications related to bowel motility Outcome: Completed/Met Goal: Will not experience complications related to urinary retention Outcome: Completed/Met   Problem: Pain Management: Goal: General experience of comfort will improve Outcome: Completed/Met   Problem: Safety: Goal: Ability to remain free from injury will improve Outcome: Completed/Met   Problem: Skin Integrity: Goal: Risk for impaired skin integrity will decrease Outcome: Completed/Met   Problem: Education: Goal: Ability to demonstrate management of disease process will improve Outcome: Completed/Met Goal: Ability to verbalize understanding of medication therapies will improve Outcome: Completed/Met Goal: Individualized Educational Video(s) Outcome: Completed/Met   Problem: Activity: Goal: Capacity to carry out activities will improve Outcome:  Completed/Met   Problem: Cardiac: Goal: Ability to achieve and maintain adequate cardiopulmonary perfusion will improve Outcome: Completed/Met

## 2023-02-08 NOTE — Progress Notes (Signed)
SATURATION QUALIFICATIONS: (This note is used to comply with regulatory documentation for home oxygen)  Patient Saturations on Room Air at Rest = 95%  Patient Saturations on Room Air while Ambulating = 90-92%  Patient Saturations on 0 Liters of oxygen while Ambulating = 90-92%  Please briefly explain why patient needs home oxygen: Patient was wearing 2 liters of oxygen intermittently and having some shortness of breath, but does not need home oxygen. MD aware.

## 2023-02-08 NOTE — Discharge Summary (Signed)
Physician Discharge Summary   Mary [name] ZOX:[id number] DOB: [birth year] DOA: 02/03/2023  PCP: Joselyn Arrow, MD  Admit date: 02/03/2023 Discharge date: 02/08/2023   Admitted From: Home Disposition:  Home Discharging physician: Lewie Chamber, MD Barriers to discharge: none  Recommendations at discharge: Follow up with cardiology; Entresto on hold Repeat BMP   Discharge Condition: stable CODE STATUS: DNR Diet recommendation:  Diet Orders (From admission, onward)     Start     Ordered   02/08/23 0000  Diet - low sodium heart healthy        02/08/23 1356   02/03/23 2032  Diet heart healthy/carb modified Room service appropriate? Yes; Fluid consistency: Thin  Diet effective now       Question Answer Comment  Diet-HS Snack? Nothing   Room service appropriate? Yes   Fluid consistency: Thin      02/03/23 2032            Hospital Course: Mary [name] is a 89 y.o. female with medical history significant of systolic heart failure (EF 45%), paroxysmal atrial tachycardia, HTN, history of invasive breast carcinoma s/p right total mastectomy (2007), presented to the hospital with 2 to 3-week history of worsening dry cough shortness of breath and nausea.  History of admission in August 2024 for systolic heart failure with bilateral pleural effusion.  Patient subsequently followed Dr. Allyson Sabal with [facility] cardiology and was placed on metoprolol succinate, spironolactone, losartan, and advised to take Lasix 10 mg 3 times a week.  Patient lives alone but does have assistance at home. her dry weight is about 116 pounds.  In the ED patient had mild leukocytosis and BMP showed mild hyponatremia.  Chest x-ray showed bilateral pleural effusion.  CT abdomen pelvis showing bilateral pleural effusions along with a nodular consolidation within the lingula.  Patient was given Rocephin and patient was admitted hospital for further evaluation and treatment.   Assessment and plan.  Acute on chronic systolic  heart failure exacerbation Review of previous 2D echocardiogram from 10/31/2022 showed  LVEF 45% with mildly decreased LV function, severely dilated left atrium, severe mitral annular calcification.   - Imaging showed effusion; on exam she was noted to have LE edema.   BNP elevated.   TSH at 2.6.   - cardiology following, appreciate assistance; diuresis per cardiology (lasix increased to 20 mg three times weekly) - further GDMT also being started Sherryll Burger held and to be resumed outpt)   Lingular opacity Nodular consolidation within the lingula noted on CT abdomen pelvis.   -Very brief mild leukocytosis on admission which normalized and remained normal after antibiotic discontinuation - Has remained afebrile and nontoxic-appearing - CXR repeated 02/07/2023 showing small/increased bilateral pleural effusions and mild bibasilar atelectasis -Continue holding off on antibiotics - COVID-negative, RVP negative  Moderate Hyponatremia Likely secondary to hypervolemic hyponatremia.   -Monitor while on diuresis   Atrial premature complexes Paroxysmal atrial tachycardia RBBB Patient with known history of RBBB and paroxysmal atrial tachycardia.  Had episode of of SVT.  Continue metoprolol (dose increased per cardiology recommendation)  Constipation Noted on CT scan.  Continue MiraLAX and Senokot   Hypertension -Continue Toprol and spironolactone - ARB now transitioned to [facility] (held due to hypoTN, to be resumed outpt)  OA of right knee Continue Voltaren gel.   Restless Leg Syndrome Off gabapentin due to nightmares.   History of invasive mammary carcinoma Status post total right mastectomy in 02/2006.     The patient's acute and chronic medical conditions were treated accordingly.  On day of discharge, patient was felt deemed stable for discharge. Patient/family member advised to call PCP or come back to ER if needed.   Principal Diagnosis: Acute exacerbation of congestive heart  failure [facility])  Discharge Diagnoses: Active Hospital Problems   Diagnosis Date Noted   Acute exacerbation of congestive heart failure (HCC) 02/03/2023   CHF (congestive heart failure) (HCC) 02/04/2023   Chronic idiopathic constipation 02/03/2023   Paroxysmal atrial tachycardia (HCC) 02/03/2023   Acute on chronic combined systolic and diastolic CHF (congestive heart failure) (HCC) 11/01/2022    Resolved Hospital Problems  No resolved problems to display.     Discharge Instructions     Diet - low sodium heart healthy   Complete by: As directed    Increase activity slowly   Complete by: As directed       Allergies as of 02/08/2023       Reactions   Gabapentin Other (See Comments)   Nightmares   Arimidex [anastrozole] Rash   Avelox [moxifloxacin Hcl In Nacl] Nausea Only, Rash   Azithromycin Nausea Only, Rash   Bactrim Nausea Only, Rash   Biaxin [clarithromycin] Nausea Only, Rash   Nitrofurantoin Nausea Only, Rash, Other (See Comments)   Penicillins Nausea Only, Rash   Sulfa Antibiotics Nausea And Vomiting, Rash, Other (See Comments)   GI intolerance   Sulfur Nausea And Vomiting, Rash        Medication List     STOP taking these medications    gabapentin 100 MG capsule Commonly known as: NEURONTIN   losartan 25 MG tablet Commonly known as: COZAAR       TAKE these medications    Acetaminophen 500 MG capsule Take 1,000 mg by mouth 2 (two) times daily after a meal.   atorvastatin 40 MG tablet Commonly known as: LIPITOR TAKE 1 TABLET(40 MG) BY MOUTH DAILY What changed: See the new instructions.   bisacodyl 5 MG EC tablet Commonly known as: DULCOLAX Take 5-15 mg by mouth See admin instructions. Take 5 mg by mouth once a day and 15 mg once daily for constipation   denosumab 60 MG/ML Soln injection Commonly known as: PROLIA Inject 60 mg into the skin every 6 (six) months. Administer in upper arm, thigh, or abdomen   diazepam 2 MG tablet Commonly known  as: Valium TAKE 1 TABLET(2 MG) BY MOUTH EVERY 8 HOURS AS NEEDED FOR MUSCLE SPASMS What changed:  how much to take how to take this when to take this reasons to take this additional instructions   fluticasone 0.005 % ointment Commonly known as: CUTIVATE Apply 1 Application topically daily as needed (irritation- affected area).   furosemide 20 MG tablet Commonly known as: LASIX Take 1 tablet (20 mg total) by mouth 3 (three) times a week. Start taking on: February 09, 2023 What changed:  how much to take when to take this   Lithostat 250 MG Tabs Generic drug: Acetohydroxamic Acid Take 250 mg by mouth See admin instructions. Take 250 mg by mouth once a day- cut into TWO halves and taken together   loratadine 10 MG tablet Commonly known as: CLARITIN Take 10 mg by mouth daily.   metoprolol succinate 50 MG 24 hr tablet Commonly known as: TOPROL-XL Take 1 tablet (50 mg total) by mouth daily. Take with or immediately following a meal. Start taking on: February 09, 2023 What changed:  medication strength how much to take   Mucinex Fast-Max DM Max 20-400 MG/20ML Liqd Generic drug: Dextromethorphan-guaiFENesin Take 10-20  mLs by mouth every 6 (six) hours as needed (for coughing).   ondansetron 4 MG disintegrating tablet Commonly known as: Zofran ODT Take 1 tablet (4 mg total) by mouth every 8 (eight) hours as needed for nausea or vomiting. What changed: reasons to take this   PreserVision AREDS 2 Caps Take 1 capsule by mouth 2 (two) times daily.   REFRESH OP Place 1 drop into both eyes 2 (two) times daily.   spironolactone 25 MG tablet Commonly known as: ALDACTONE Take 0.5 tablets (12.5 mg total) by mouth daily.   traMADol 50 MG tablet Commonly known as: ULTRAM Take 25-50 mg by mouth every 8 (eight) hours as needed (for pain).   triamcinolone cream 0.1 % Commonly known as: KENALOG Apply 1 Application topically 2 (two) times daily as needed (for itching).   Vitamin  D3 50 MCG (2000 UT) Tabs Take 2,000 Units by mouth daily.        Follow-up Information     [facility] Follow up.   Specialty: Home Health Services Why: Home Health will follow up with you 24hrs to 48hrs after discharge. Contact information: [address] [place] [address] [phone]                Allergies  Allergen Reactions   Gabapentin Other (See Comments)    Nightmares    Arimidex [Anastrozole] Rash   Avelox [Moxifloxacin Hcl In Nacl] Nausea Only and Rash   Azithromycin Nausea Only and Rash   Bactrim Nausea Only and Rash   Biaxin [Clarithromycin] Nausea Only and Rash   Nitrofurantoin Nausea Only, Rash and Other (See Comments)   Penicillins Nausea Only and Rash   Sulfa Antibiotics Nausea And Vomiting, Rash and Other (See Comments)    GI intolerance   Sulfur Nausea And Vomiting and Rash    Consultations: Cardiology  Procedures:   Discharge Exam: BP 119/70 (BP Location: Left Arm)   Pulse 79   Temp 97.8 F (36.6 C) (Oral)   Resp 16   Ht 5\' 3"  (1.6 m)   Wt 54.6 kg   LMP  (LMP Unknown)   SpO2 95%   BMI 21.32 kg/m  Physical Exam Constitutional:      Appearance: Normal appearance.  HENT:     Head: Normocephalic and atraumatic.     Mouth/Throat:     Mouth: Mucous membranes are moist.  Eyes:     Extraocular Movements: Extraocular movements intact.  Cardiovascular:     Rate and Rhythm: Normal rate and regular rhythm.  Pulmonary:     Effort: Pulmonary effort is normal. No respiratory distress.     Breath sounds: Normal breath sounds. No wheezing.  Abdominal:     General: Bowel sounds are normal. There is no distension.     Palpations: Abdomen is soft.     Tenderness: There is no abdominal tenderness.  Musculoskeletal:        General: Normal range of motion.     Cervical back: Normal range of motion and neck supple.  Skin:    General: Skin is warm and dry.  Neurological:     General: No focal deficit present.      Mental Status: She is alert.  Psychiatric:        Mood and Affect: Mood normal.      The results of significant diagnostics from this hospitalization (including imaging, microbiology, ancillary and laboratory) are listed below for reference.   Microbiology: Recent Results (from the past 240  hour(s))  Respiratory (~20 pathogens) panel by PCR     Status: None   Collection Time: 02/07/23  1:50 PM   Specimen: Nasopharyngeal Swab; Respiratory  Result Value Ref Range Status   Adenovirus NOT DETECTED NOT DETECTED Final   Coronavirus 229E NOT DETECTED NOT DETECTED Final    Comment: (NOTE) The Coronavirus on the Respiratory Panel, DOES NOT test for the novel  Coronavirus (2019 nCoV)    Coronavirus HKU1 NOT DETECTED NOT DETECTED Final   Coronavirus NL63 NOT DETECTED NOT DETECTED Final   Coronavirus OC43 NOT DETECTED NOT DETECTED Final   Metapneumovirus NOT DETECTED NOT DETECTED Final   Rhinovirus / Enterovirus NOT DETECTED NOT DETECTED Final   Influenza A NOT DETECTED NOT DETECTED Final   Influenza B NOT DETECTED NOT DETECTED Final   Parainfluenza Virus 1 NOT DETECTED NOT DETECTED Final   Parainfluenza Virus 2 NOT DETECTED NOT DETECTED Final   Parainfluenza Virus 3 NOT DETECTED NOT DETECTED Final   Parainfluenza Virus 4 NOT DETECTED NOT DETECTED Final   Respiratory Syncytial Virus NOT DETECTED NOT DETECTED Final   Bordetella pertussis NOT DETECTED NOT DETECTED Final   Bordetella Parapertussis NOT DETECTED NOT DETECTED Final   Chlamydophila pneumoniae NOT DETECTED NOT DETECTED Final   Mycoplasma pneumoniae NOT DETECTED NOT DETECTED Final    Comment: Performed at [facility], [address] [place], [place] [address]  SARS Coronavirus 2 by RT PCR (hospital order, performed in [facility] hospital lab) *cepheid single result test* Anterior Nasal Swab     Status: None   Collection Time: 02/07/23  1:50 PM   Specimen: Anterior Nasal Swab  Result Value Ref Range Status   SARS  Coronavirus 2 by RT PCR NEGATIVE NEGATIVE Final    Comment: (NOTE) SARS-CoV-2 target nucleic acids are NOT DETECTED.  The SARS-CoV-2 RNA is generally detectable in upper and lower respiratory specimens during the acute phase of infection. The lowest concentration of SARS-CoV-2 viral copies this assay can detect is 250 copies / mL. A negative result does not preclude SARS-CoV-2 infection and should not be used as the sole basis for treatment or other patient management decisions.  A negative result may occur with improper specimen collection / handling, submission of specimen other than nasopharyngeal swab, presence of viral mutation(s) within the areas targeted by this assay, and inadequate number of viral copies (<250 copies / mL). A negative result must be combined with clinical observations, patient history, and epidemiological information.  Fact Sheet for Patients:   RoadLapTop.co.za  Fact Sheet for Healthcare Providers: http://kim-miller.com/  This test is not yet approved or  cleared by the [place] FDA and has been authorized for detection and/or diagnosis of SARS-CoV-2 by FDA under an Emergency Use Authorization (EUA).  This EUA will remain in effect (meaning this test can be used) for the duration of the COVID-19 declaration under Section 564(b)(1) of the Act, 21 U.S.C. section 360bbb-3(b)(1), unless the authorization is terminated or revoked sooner.  Performed at [facility], [address], [place], [place] [address]      Labs: BNP (last 3 results) Recent Labs    10/30/22 2040 02/03/23 2244 02/08/23 0436  BNP 674.3* 969.8* 420.1*   Basic Metabolic Panel: Recent Labs  Lab 02/04/23 0450 02/05/23 0400 02/06/23 0356 02/07/23 0429 02/08/23 0436  NA 132* 131* 128* 128* 126*  K 3.6 3.7 3.5 3.6 4.0  CL 92* 93* 89* 87* 83*  CO2 30 28 28 29  32  GLUCOSE 96  117* 123* 128* 126*  BUN 13 17 18 21   24*  CREATININE 0.68 0.63 0.71 0.91 1.08*  CALCIUM 8.8* 9.1 8.6* 9.0 8.9  MG  --  2.0 1.9 1.9  --    Liver Function Tests: Recent Labs  Lab 02/03/23 1656 02/04/23 0450  AST 21 20  ALT 25 25  ALKPHOS 111 116  BILITOT 0.7 0.7  PROT 6.2* 6.6  ALBUMIN 3.0* 3.1*   Recent Labs  Lab 02/03/23 1656  LIPASE 28   No results for input(s): "AMMONIA" in the last 168 hours. CBC: Recent Labs  Lab 02/03/23 1656 02/04/23 0450 02/05/23 0400 02/06/23 0356 02/07/23 0429 02/08/23 0436  WBC 11.0* 9.9 10.1 9.0 9.8 7.3  NEUTROABS 9.2*  --   --   --   --   --   HGB 12.0 11.9* 12.2 12.2 12.7 12.8  HCT 37.4 36.9 38.3 37.2 38.3 40.4  MCV 88.2 88.7 88.9 87.1 86.5 87.4  PLT 299 308 338 351 405* 402*   Cardiac Enzymes: No results for input(s): "CKTOTAL", "CKMB", "CKMBINDEX", "TROPONINI" in the last 168 hours. BNP: Invalid input(s): "POCBNP" CBG: Recent Labs  Lab 02/05/23 0734 02/06/23 0742 02/07/23 0729 02/08/23 0731  GLUCAP 128* 101* 109* 111*   D-Dimer No results for input(s): "DDIMER" in the last 72 hours. Hgb A1c No results for input(s): "HGBA1C" in the last 72 hours. Lipid Profile No results for input(s): "CHOL", "HDL", "LDLCALC", "TRIG", "CHOLHDL", "LDLDIRECT" in the last 72 hours. Thyroid function studies No results for input(s): "TSH", "T4TOTAL", "T3FREE", "THYROIDAB" in the last 72 hours.  Invalid input(s): "FREET3" Anemia work up No results for input(s): "VITAMINB12", "FOLATE", "FERRITIN", "TIBC", "IRON", "RETICCTPCT" in the last 72 hours. Urinalysis    Component Value Date/Time   COLORURINE STRAW (A) 02/03/2023 1926   APPEARANCEUR CLEAR 02/03/2023 1926   LABSPEC 1.013 02/03/2023 1926   PHURINE 6.0 02/03/2023 1926   GLUCOSEU NEGATIVE 02/03/2023 1926   HGBUR NEGATIVE 02/03/2023 1926   BILIRUBINUR NEGATIVE 02/03/2023 1926   BILIRUBINUR negative 12/25/2020 1629   BILIRUBINUR neg 04/10/2016 1442   KETONESUR NEGATIVE 02/03/2023 1926   PROTEINUR NEGATIVE 02/03/2023  1926   UROBILINOGEN 0.2 12/25/2020 1629   UROBILINOGEN 0.2 02/22/2013 0447   NITRITE NEGATIVE 02/03/2023 1926   LEUKOCYTESUR TRACE (A) 02/03/2023 1926   Sepsis Labs Recent Labs  Lab 02/05/23 0400 02/06/23 0356 02/07/23 0429 02/08/23 0436  WBC 10.1 9.0 9.8 7.3   Microbiology Recent Results (from the past 240 hour(s))  Respiratory (~20 pathogens) panel by PCR     Status: None   Collection Time: 02/07/23  1:50 PM   Specimen: Nasopharyngeal Swab; Respiratory  Result Value Ref Range Status   Adenovirus NOT DETECTED NOT DETECTED Final   Coronavirus 229E NOT DETECTED NOT DETECTED Final    Comment: (NOTE) The Coronavirus on the Respiratory Panel, DOES NOT test for the novel  Coronavirus (2019 nCoV)    Coronavirus HKU1 NOT DETECTED NOT DETECTED Final   Coronavirus NL63 NOT DETECTED NOT DETECTED Final   Coronavirus OC43 NOT DETECTED NOT DETECTED Final   Metapneumovirus NOT DETECTED NOT DETECTED Final   Rhinovirus / Enterovirus NOT DETECTED NOT DETECTED Final   Influenza A NOT DETECTED NOT DETECTED Final   Influenza B NOT DETECTED NOT DETECTED Final   Parainfluenza Virus 1 NOT DETECTED NOT DETECTED Final   Parainfluenza Virus 2 NOT DETECTED NOT DETECTED Final   Parainfluenza Virus 3 NOT DETECTED NOT DETECTED Final   Parainfluenza Virus 4 NOT DETECTED NOT DETECTED Final  Respiratory Syncytial Virus NOT DETECTED NOT DETECTED Final   Bordetella pertussis NOT DETECTED NOT DETECTED Final   Bordetella Parapertussis NOT DETECTED NOT DETECTED Final   Chlamydophila pneumoniae NOT DETECTED NOT DETECTED Final   Mycoplasma pneumoniae NOT DETECTED NOT DETECTED Final    Comment: Performed at [facility], [address] [place], [place] [address]  SARS Coronavirus 2 by RT PCR (hospital order, performed in [facility] hospital lab) *cepheid single result test* Anterior Nasal Swab     Status: None   Collection Time: 02/07/23  1:50 PM   Specimen: Anterior Nasal Swab  Result Value Ref Range  Status   SARS Coronavirus 2 by RT PCR NEGATIVE NEGATIVE Final    Comment: (NOTE) SARS-CoV-2 target nucleic acids are NOT DETECTED.  The SARS-CoV-2 RNA is generally detectable in upper and lower respiratory specimens during the acute phase of infection. The lowest concentration of SARS-CoV-2 viral copies this assay can detect is 250 copies / mL. A negative result does not preclude SARS-CoV-2 infection and should not be used as the sole basis for treatment or other patient management decisions.  A negative result may occur with improper specimen collection / handling, submission of specimen other than nasopharyngeal swab, presence of viral mutation(s) within the areas targeted by this assay, and inadequate number of viral copies (<250 copies / mL). A negative result must be combined with clinical observations, patient history, and epidemiological information.  Fact Sheet for Patients:   RoadLapTop.co.za  Fact Sheet for Healthcare Providers: http://kim-miller.com/  This test is not yet approved or  cleared by the [place] FDA and has been authorized for detection and/or diagnosis of SARS-CoV-2 by FDA under an Emergency Use Authorization (EUA).  This EUA will remain in effect (meaning this test can be used) for the duration of the COVID-19 declaration under Section 564(b)(1) of the Act, 21 U.S.C. section 360bbb-3(b)(1), unless the authorization is terminated or revoked sooner.  Performed at [facility], [address] [place], [place] [address]     Procedures/Studies: DG CHEST PORT 1 VIEW  Result Date: 02/07/2023 CLINICAL DATA:  Shortness of breath and dry cough. EXAM: PORTABLE CHEST 1 VIEW COMPARISON:  Chest x-ray dated February 03, 2023. FINDINGS: Unchanged mild cardiomegaly. Normal pulmonary vascularity. The lungs remain hyperinflated. Slightly increased small bilateral pleural effusions with mild bibasilar  atelectasis. No pneumothorax. No acute osseous abnormality. IMPRESSION: 1. Slightly increased small bilateral pleural effusions with mild bibasilar atelectasis. Electronically Signed   By: Obie Dredge M.D.   On: 02/07/2023 16:43   ECHOCARDIOGRAM COMPLETE  Result Date: 02/04/2023    ECHOCARDIOGRAM REPORT   Patient Name:   LATALIA DENTE Date of Exam: 02/04/2023 Medical Rec #:  [id number]   Height:       63.0 in Accession #:    [id number]  Weight:       118.2 lb Date of Birth:  1926/04/02  BSA:          1.546 m Patient Age:    89 years    BP:           153/91 mmHg Patient Gender: F           HR:           86 bpm. Exam Location:  Inpatient Procedure: 2D Echo, Cardiac Doppler and Color Doppler Indications:    CHF, acute diastolic  History:        Patient has prior history of Echocardiogram examinations, most  recent 10/31/2022. Risk Factors:Hypertension and Dyslipidemia.  Sonographer:    Karma Ganja Referring Phys: [id number] Briscoe Burns  Sonographer Comments: Technically challenging study due to limited acoustic windows. Image acquisition challenging due to uncooperative patient. IMPRESSIONS  1. Left ventricular ejection fraction, by estimation, is 45 to 50%. The left ventricle has mildly decreased function. The left ventricle demonstrates global hypokinesis. Left ventricular diastolic function could not be evaluated.  2. Right ventricular systolic function is normal. The right ventricular size is not well visualized. There is mildly elevated pulmonary artery systolic pressure.  3. Left atrial size was moderately dilated.  4. The mitral valve is degenerative. Trivial mitral valve regurgitation. No evidence of mitral stenosis. Severe mitral annular calcification.  5. The aortic valve is tricuspid. There is mild calcification of the aortic valve. There is mild thickening of the aortic valve. Aortic valve regurgitation is not visualized. Aortic valve sclerosis/calcification is present, without any  evidence of aortic stenosis. Comparison(s): No significant change from prior study. FINDINGS  Left Ventricle: Left ventricular ejection fraction, by estimation, is 45 to 50%. The left ventricle has mildly decreased function. The left ventricle demonstrates global hypokinesis. The left ventricular internal cavity size was normal in size. There is  no left ventricular hypertrophy. Left ventricular diastolic function could not be evaluated due to mitral annular calcification (moderate or greater). Left ventricular diastolic function could not be evaluated. Right Ventricle: The right ventricular size is not well visualized. Right vetricular wall thickness was not well visualized. Right ventricular systolic function is normal. There is mildly elevated pulmonary artery systolic pressure. The tricuspid regurgitant velocity is 2.95 m/s, and with an assumed right atrial pressure of 3 mmHg, the estimated right ventricular systolic pressure is 37.8 mmHg. Left Atrium: Left atrial size was moderately dilated. Right Atrium: Right atrial size was normal in size. Pericardium: There is no evidence of pericardial effusion. Mitral Valve: The mitral valve is degenerative in appearance. There is moderate thickening of the mitral valve leaflet(s). There is moderate calcification of the mitral valve leaflet(s). Severe mitral annular calcification. Trivial mitral valve regurgitation. No evidence of mitral valve stenosis. Tricuspid Valve: The tricuspid valve is normal in structure. Tricuspid valve regurgitation is trivial. No evidence of tricuspid stenosis. Aortic Valve: The aortic valve is tricuspid. There is mild calcification of the aortic valve. There is mild thickening of the aortic valve. Aortic valve regurgitation is not visualized. Aortic valve sclerosis/calcification is present, without any evidence of aortic stenosis. Aortic valve mean gradient measures 3.0 mmHg. Aortic valve peak gradient measures 6.2 mmHg. Aortic valve area, by  VTI measures 1.55 cm. Pulmonic Valve: The pulmonic valve was not well visualized. Pulmonic valve regurgitation is trivial. Aorta: The aortic root and ascending aorta are structurally normal, with no evidence of dilitation. IAS/Shunts: The interatrial septum was not well visualized. Additional Comments: There is a small pleural effusion in the left lateral region.  LEFT VENTRICLE PLAX 2D LVIDd:         4.50 cm     Diastology LVIDs:         3.40 cm     LV e' medial:    3.37 cm/s LV PW:         1.10 cm     LV E/e' medial:  21.8 LV IVS:        0.90 cm     LV e' lateral:   8.70 cm/s LVOT diam:     1.80 cm     LV E/e' lateral: 8.4 LV SV:  32 LV SV Index:   21 LVOT Area:     2.54 cm  LV Volumes (MOD) LV vol d, MOD A2C: 69.9 ml LV vol d, MOD A4C: 64.7 ml LV vol s, MOD A2C: 28.8 ml LV vol s, MOD A4C: 29.7 ml LV SV MOD A2C:     41.1 ml LV SV MOD A4C:     64.7 ml LV SV MOD BP:      38.3 ml RIGHT VENTRICLE RV Basal diam:  4.40 cm RV S prime:     8.16 cm/s TAPSE (M-mode): 2.4 cm LEFT ATRIUM           Index        RIGHT ATRIUM           Index LA diam:      3.20 cm 2.07 cm/m   RA Area:     14.50 cm LA Vol (A2C): 37.6 ml 24.32 ml/m  RA Volume:   35.60 ml  23.03 ml/m LA Vol (A4C): 47.9 ml 30.98 ml/m  AORTIC VALVE AV Area (Vmax):    1.53 cm AV Area (Vmean):   1.41 cm AV Area (VTI):     1.55 cm AV Vmax:           124.00 cm/s AV Vmean:          82.800 cm/s AV VTI:            0.208 m AV Peak Grad:      6.2 mmHg AV Mean Grad:      3.0 mmHg LVOT Vmax:         74.60 cm/s LVOT Vmean:        45.800 cm/s LVOT VTI:          0.127 m LVOT/AV VTI ratio: 0.61  AORTA Ao Root diam: 2.80 cm Ao Asc diam:  2.90 cm MITRAL VALVE               TRICUSPID VALVE MV Area (PHT): 3.02 cm    TR Peak grad:   34.8 mmHg MV Decel Time: 251 msec    TR Vmax:        295.00 cm/s MV E velocity: 73.30 cm/s MV A velocity: 81.80 cm/s  SHUNTS MV E/A ratio:  0.90        Systemic VTI:  0.13 m                            Systemic Diam: 1.80 cm Jodelle Red MD Electronically signed by Jodelle Red MD Signature Date/Time: 02/04/2023/2:19:56 PM    Final    CT ABDOMEN PELVIS W CONTRAST  Result Date: 02/03/2023 CLINICAL DATA:  Nausea, dry heaves for several days, suspected bowel obstruction EXAM: CT ABDOMEN AND PELVIS WITH CONTRAST TECHNIQUE: Multidetector CT imaging of the abdomen and pelvis was performed using the standard protocol following bolus administration of intravenous contrast. RADIATION DOSE REDUCTION: This exam was performed according to the departmental dose-optimization program which includes automated exposure control, adjustment of the mA and/or kV according to patient size and/or use of iterative reconstruction technique. CONTRAST:  OMNIPAQUE IOHEXOL 300 MG/ML  SOLN COMPARISON:  12/14/2022 FINDINGS: Lower chest: Free-flowing bilateral pleural effusions, volume estimated the 500 cc. Nonspecific nodular consolidation within the lingula, measuring up to 17 mm. This could reflect localized infection or inflammation, and follow-up is recommended to exclude pulmonary mass. Dependent consolidation within the lower lobes. Cardiomegaly, with prominent calcifications of the mitral annulus and  biatrial dilatation. Hepatobiliary: No focal liver abnormality is seen. No gallstones, gallbladder wall thickening, or biliary dilatation. Pancreas: Unremarkable. No pancreatic ductal dilatation or surrounding inflammatory changes. Spleen: Normal in size without focal abnormality. Adrenals/Urinary Tract: Bilateral renal cortical cysts do not require specific imaging follow-up. No renal calculi or obstructive uropathy. The adrenals are unremarkable. Bladder is decompressed by a suprapubic catheter. There are 2 small remaining bladder calculi measuring up to 4 mm. The remaining calculi have been evacuated in the interim. Stomach/Bowel: Postsurgical changes from partial distal colectomy and reanastomosis. No bowel obstruction or ileus. Moderate  retained stool within the colon. No bowel wall thickening or inflammatory change. Vascular/Lymphatic: Aortic atherosclerosis. No enlarged abdominal or pelvic lymph nodes. Reproductive: Uterus is atrophic.  No adnexal masses. Other: No free fluid or free intraperitoneal gas. No abdominal wall hernia. Musculoskeletal: No acute displaced fractures. Severe bilateral hip osteoarthritis again noted, right greater than left. Bilateral hip effusions are unchanged. Reconstructed images demonstrate no additional findings. IMPRESSION: 1. Bilateral pleural effusions. 2. Nodular consolidation within the lingula, which may reflect localized inflammation or infection. Follow-up imaging is recommended to document resolution and exclude parenchymal lung nodules. 3. No bowel obstruction or ileus. Moderate retained stool throughout the colon. 4. Small residual bladder calculi as above. The majority of the calculi have been evacuated since previous exam. Stable suprapubic catheter in place. 5.  Aortic Atherosclerosis (ICD10-I70.0). 6. Stable degenerative changes of the bilateral hips, right greater than left. Electronically Signed   By: Sharlet Salina M.D.   On: 02/03/2023 19:10   DG Chest Portable 1 View  Result Date: 02/03/2023 CLINICAL DATA:  Cough. Several day history of nausea and dry heaving EXAM: PORTABLE CHEST 1 VIEW COMPARISON:  Chest radiograph dated 10/30/2022 FINDINGS: Hyperinflated lungs. No focal consolidations. Mild blunting of bilateral costophrenic angles. No pneumothorax. Similar mildly enlarged cardiomediastinal silhouette. No acute osseous abnormality. IMPRESSION: 1. Hyperinflated lungs, which can be seen in the setting of COPD. 2. Mild blunting of bilateral costophrenic angles, which may represent trace pleural effusions or pleural thickening. 3. Similar mild cardiomegaly. Electronically Signed   By: Agustin Cree M.D.   On: 02/03/2023 19:06     Time coordinating discharge: Over 30 minutes    Lewie Chamber,  MD  [facility] 02/08/2023, 5:40 PM

## 2023-02-08 NOTE — Progress Notes (Signed)
Patient received discharge orders to go home. Patient was given discharge paperwork/instructions. RN went over discharge paperwork/instructions with patient and patient's family/caregiver. All questions/concerns were addressed/answered to the best of RN's ability. Patient left the hospital stable, had discharge paperwork/instructions, and had all personal belongings.

## 2023-02-11 NOTE — Progress Notes (Unsigned)
Cardiology Office Note:  .   Date:  02/14/2023  ID:  [name], DOB [birth year], MRN [id number] PCP: Joselyn Arrow, MD  [facility] Providers Cardiologist:  Nanetta Batty, MD  History of Present Illness: .   Mary [name] is a 89 y.o. female with a history of heart failure with mildly reduced ejection fraction (EF 45%), paroxysmal atrial tachycardia, hypertension.  She is being seen for posthospital follow-up where she was admitted for acute on chronic CHF, EF 45%.  She was diuresed with IV Lasix, transitioned from losartan to low-dose Entresto, but with urologic symptoms was not initially placed on SGLT2 inhibitors, she was also found to have SVT and PACs and was continued on beta-blocker therapy.    She was discharged on Lasix 20 mg 3 times a week, metoprolol was increased to 50 mg daily, with plans to restart Entresto as outpatient if blood pressure was stable.  She was discharged on 02/08/2023.  She will need a repeat BMET.  Weight on discharge 120 pounds.  She comes today with fatigue, and a persistent cough.  She is sleeping in a recliner as it is easier on her body due to osteoarthritis.  She does have a history of postnasal drip and has not been taking Claritin consistently since returning home.  She denies any rapid heart rhythm or significant palpitations.  ROS: As above otherwise negative  Studies Reviewed: .    Echocardiogram 02/04/2023   1. Left ventricular ejection fraction, by estimation, is 45 to 50%. The  left ventricle has mildly decreased function. The left ventricle  demonstrates global hypokinesis. Left ventricular diastolic function could  not be evaluated.   2. Right ventricular systolic function is normal. The right ventricular  size is not well visualized. There is mildly elevated pulmonary artery  systolic pressure.   3. Left atrial size was moderately dilated.   4. The mitral valve is degenerative. Trivial mitral valve regurgitation.  No evidence of  mitral stenosis. Severe mitral annular calcification.   5. The aortic valve is tricuspid. There is mild calcification of the  aortic valve. There is mild thickening of the aortic valve. Aortic valve  regurgitation is not visualized. Aortic valve sclerosis/calcification is  present, without any evidence of  aortic stenosis.    Physical Exam:   VS:  BP 138/72 (BP Location: Left Arm, Patient Position: Sitting, Cuff Size: Normal)   Pulse (!) 47   Ht 5\' 3"  (1.6 m)   Wt 114 lb (51.7 kg)   LMP  (LMP Unknown)   SpO2 93%   BMI 20.19 kg/m    Wt Readings from Last 3 Encounters:  02/14/23 114 lb (51.7 kg)  02/08/23 120 lb 5.9 oz (54.6 kg)  02/01/23 120 lb (54.4 kg)    GEN: Well nourished, well developed in no acute distress NECK: No JVD; No carotid bruits CARDIAC: IRRR, no murmurs, rubs, gallops RESPIRATORY:  Clear to auscultation without rales, wheezing or rhonchi upper airway dry cough. ABDOMEN: Soft, non-tender, non-distended EXTREMITIES: Mild bilateral dependent edema; No deformity   ASSESSMENT AND PLAN: .    Paroxysmal atrial tachycardia: Heart rate is currently well-controlled but she does have extrasystole.  Will continue on her current medication regimen of metoprolol succinate 50 mg daily.  She is not on ACE or ARB which would be causing the cough.  2.  Chronic combined CHF.  The patient remains on furosemide 20 mg 3 times daily and spironolactone 12.5 mg daily no evidence of volume overload today.  Her  weight is 114 pounds compared to 120 on discharge.  I would like to get a BMET today to evaluate for kidney function.  She has refused labs as she states she has been stuck enough.  I do want to see her back in a couple of weeks at which time she has agreed to have labs drawn.  3.  Postnasal drip with cough: The patient is to take her Claritin daily as directed.  I have started her on benzonatate cough Perles 100 mg 3 times daily.  Hopefully this will help with symptoms.  4.   Hypertension: Blood pressure currently controlled.  No changes in regimen at this time.  She will need to have be met on next office visit.  5.  Hypercholesterolemia: Patient remains on atorvastatin 40 mg daily.  She will need to have fasting lipids and LFTs when being seen by Dr. Allyson Sabal again in February 2025.           Signed, Bettey Mare. Liborio Nixon, ANP, AACC

## 2023-02-12 DIAGNOSIS — R069 Unspecified abnormalities of breathing: Secondary | ICD-10-CM | POA: Diagnosis not present

## 2023-02-12 DIAGNOSIS — I499 Cardiac arrhythmia, unspecified: Secondary | ICD-10-CM | POA: Diagnosis not present

## 2023-02-14 ENCOUNTER — Ambulatory Visit: Payer: Medicare Other | Attending: Adult Health | Admitting: Adult Health

## 2023-02-14 ENCOUNTER — Encounter: Payer: Self-pay | Admitting: Adult Health

## 2023-02-14 VITALS — BP 138/72 | HR 47 | Ht 63.0 in | Wt 114.0 lb

## 2023-02-14 DIAGNOSIS — I10 Essential (primary) hypertension: Secondary | ICD-10-CM

## 2023-02-14 MED ORDER — BENZONATATE 100 MG PO CAPS: 100.0000 mg | ORAL_CAPSULE | Freq: Three times a day (TID) | ORAL | 0 refills | Status: DC

## 2023-02-14 NOTE — Patient Instructions (Signed)
Medication Instructions:  Start Tessalon Perles 100 mg ( Take 1 Capsule (3) Three Times Daily). *If you need a refill on your cardiac medications before your next appointment, please call your pharmacy*   Lab Work: BMET If you have labs (blood work) drawn today and your tests are completely normal, you will receive your results only by: MyChart Message (if you have MyChart) OR A paper copy in the mail If you have any lab test that is abnormal or we need to change your treatment, we will call you to review the results.   Testing/Procedures: No Testing   Follow-Up: At [facility], you and your health needs are our priority.  As part of our continuing mission to provide you with exceptional heart care, we have created designated Provider Care Teams.  These Care Teams include your primary Cardiologist (physician) and Advanced Practice Providers (APPs -  Physician Assistants and Nurse Practitioners) who all work together to provide you with the care you need, when you need it.  We recommend signing up for the patient portal called "MyChart".  Sign up information is provided on this After Visit Summary.  MyChart is used to connect with patients for Virtual Visits (Telemedicine).  Patients are able to view lab/test results, encounter notes, upcoming appointments, etc.  Non-urgent messages can be sent to your provider as well.   To learn more about what you can do with MyChart, go to [email or web address].    Your next appointment:   2 week(s)  Provider:   Joni Reining, DNP, ANP

## 2023-02-20 ENCOUNTER — Encounter (HOSPITAL_BASED_OUTPATIENT_CLINIC_OR_DEPARTMENT_OTHER): Payer: Self-pay

## 2023-02-20 ENCOUNTER — Other Ambulatory Visit: Payer: Self-pay

## 2023-02-20 ENCOUNTER — Emergency Department (HOSPITAL_BASED_OUTPATIENT_CLINIC_OR_DEPARTMENT_OTHER)
Admission: EM | Admit: 2023-02-20 | Discharge: 2023-02-20 | Disposition: A | Discharge status: Home / Self Care | Payer: Medicare Other | Attending: Emergency Medicine | Admitting: Emergency Medicine

## 2023-02-20 DIAGNOSIS — Z85038 Personal history of other malignant neoplasm of large intestine: Secondary | ICD-10-CM | POA: Diagnosis not present

## 2023-02-20 DIAGNOSIS — Z853 Personal history of malignant neoplasm of breast: Secondary | ICD-10-CM | POA: Diagnosis not present

## 2023-02-20 DIAGNOSIS — R9431 Abnormal electrocardiogram [ECG] [EKG]: Secondary | ICD-10-CM | POA: Diagnosis not present

## 2023-02-20 DIAGNOSIS — I10 Essential (primary) hypertension: Secondary | ICD-10-CM | POA: Diagnosis not present

## 2023-02-20 DIAGNOSIS — Z79899 Other long term (current) drug therapy: Secondary | ICD-10-CM | POA: Diagnosis not present

## 2023-02-20 DIAGNOSIS — T83091A Other mechanical complication of indwelling urethral catheter, initial encounter: Secondary | ICD-10-CM | POA: Insufficient documentation

## 2023-02-20 DIAGNOSIS — T83198A Other mechanical complication of other urinary devices and implants, initial encounter: Secondary | ICD-10-CM | POA: Diagnosis not present

## 2023-02-20 DIAGNOSIS — R110 Nausea: Secondary | ICD-10-CM | POA: Diagnosis not present

## 2023-02-20 DIAGNOSIS — T839XXA Unspecified complication of genitourinary prosthetic device, implant and graft, initial encounter: Secondary | ICD-10-CM

## 2023-02-20 NOTE — ED Notes (Signed)
Pt reports feeling nauseous and both patient and family report this is patient's baseline.

## 2023-02-20 NOTE — ED Triage Notes (Signed)
Patient brought by EMS from home for suprapubic catheter coming out in the middle of the night. Pt denies pain. Not actively draining. Recent CHF diagnosis. EMS reports that family states patient has high HR at baseline. PT HR 150s on arrival. Writer inquired with family if this was normal to which they said patient has hypertension at baseline.

## 2023-02-20 NOTE — ED Provider Notes (Signed)
[facility] EMERGENCY DEPARTMENT AT [facility] Provider Note   CSN: [id number] Arrival date & time: 02/20/23  0307     History  Chief Complaint  Patient presents with   Vascular Access Problem    Mary [name] is a 89 y.o. female.  HPI     This is a 89 year old female with a chronic suprapubic catheter who presents with concern for dislodgment of the catheter.  Patient is coming from home.  Reports catheter came out in the middle the night.  She believes that it was approximately 1.5 hours ago.  She states she has had the catheter for approximately 5 years and has never had issues with it coming out spontaneously.  Of note, in triage pulse ox was picking up a heart rate in the 150s.  When she was placed on the monitor her heart rate was in the 80s.  EKG shows sinus rhythm with frequent PACs.  Patient is asymptomatic.  She denies chest pain, shortness of breath, abdominal pain. Home Medications Prior to Admission medications   Medication Sig Start Date End Date Taking? Authorizing Provider  Acetaminophen 500 MG capsule Take 1,000 mg by mouth 2 (two) times daily after a meal.    [provider]  atorvastatin (LIPITOR) 40 MG tablet TAKE 1 TABLET(40 MG) BY MOUTH DAILY Patient taking differently: Take 40 mg by mouth every evening. 09/18/22   Joselyn Arrow, MD  benzonatate (TESSALON PERLES) 100 MG capsule Take 1 capsule (100 mg total) by mouth 3 (three) times daily. 02/14/23   Jodelle Gross, NP  bisacodyl (DULCOLAX) 5 MG EC tablet Take 5-15 mg by mouth See admin instructions. Take 5 mg by mouth once a day and 15 mg once daily for constipation    [provider]  Cholecalciferol (VITAMIN D3) 50 MCG (2000 UT) TABS Take 2,000 Units by mouth daily. 08/03/20   Magrinat, Valentino Hue, MD  denosumab (PROLIA) 60 MG/ML SOLN injection Inject 60 mg into the skin every 6 (six) months. Administer in upper arm, thigh, or abdomen 10/24/12   [provider]  diazepam (VALIUM) 2  MG tablet TAKE 1 TABLET(2 MG) BY MOUTH EVERY 8 HOURS AS NEEDED FOR MUSCLE SPASMS Patient taking differently: Take 2 mg by mouth every 8 (eight) hours as needed for muscle spasms. 09/27/22   Joselyn Arrow, MD  fluticasone (CUTIVATE) 0.005 % ointment Apply 1 Application topically daily as needed (irritation- affected area).    [provider]  furosemide (LASIX) 20 MG tablet Take 1 tablet (20 mg total) by mouth 3 (three) times a week. 02/09/23   Lewie Chamber, MD  LITHOSTAT 250 MG TABS Take 250 mg by mouth See admin instructions. Take 250 mg by mouth once a day- cut into TWO halves and taken together 01/26/23   [provider]  loratadine (CLARITIN) 10 MG tablet Take 10 mg by mouth daily.    [provider]  metoprolol succinate (TOPROL-XL) 50 MG 24 hr tablet Take 1 tablet (50 mg total) by mouth daily. Take with or immediately following a meal. 02/09/23   Lewie Chamber, MD  MUCINEX FAST-MAX DM MAX 20-400 MG/20ML LIQD Take 10-20 mLs by mouth every 6 (six) hours as needed (for coughing).    [provider]  Multiple Vitamins-Minerals (PRESERVISION AREDS 2) CAPS Take 1 capsule by mouth 2 (two) times daily.    [provider]  ondansetron (ZOFRAN ODT) 4 MG disintegrating tablet Take 1 tablet (4 mg total) by mouth every 8 (eight) hours as  needed for nausea or vomiting. Patient not taking: Reported on 02/14/2023 02/01/23   Joselyn Arrow, MD  Polyvinyl Alcohol-Povidone (REFRESH OP) Place 1 drop into both eyes 2 (two) times daily.    [provider]  spironolactone (ALDACTONE) 25 MG tablet Take 0.5 tablets (12.5 mg total) by mouth daily. 12/06/22   Laurey Morale, MD  traMADol (ULTRAM) 50 MG tablet Take 25-50 mg by mouth every 8 (eight) hours as needed (for pain).    [provider]  triamcinolone cream (KENALOG) 0.1 % Apply 1 Application topically 2 (two) times daily as needed (for itching).    [provider]      Allergies    Gabapentin,  Arimidex [anastrozole], Avelox [moxifloxacin hcl in nacl], Azithromycin, Bactrim, Biaxin [clarithromycin], Nitrofurantoin, Penicillins, Sulfa antibiotics, and Sulfur    Review of Systems   Review of Systems  Constitutional:  Negative for fever.  Respiratory:  Negative for shortness of breath.   Cardiovascular:  Negative for chest pain.  Gastrointestinal:  Negative for abdominal pain.  All other systems reviewed and are negative.   Physical Exam Updated Vital Signs BP (!) 157/117   Pulse 83   Temp 98.4 F (36.9 C) (Temporal)   Resp (!) 22   Wt 54 kg   LMP  (LMP Unknown)   SpO2 93%   BMI 21.08 kg/m  Physical Exam Vitals and nursing note reviewed.  Constitutional:      Appearance: She is well-developed. She is not ill-appearing.  HENT:     Head: Normocephalic and atraumatic.  Cardiovascular:     Rate and Rhythm: Normal rate and regular rhythm.  Pulmonary:     Effort: Pulmonary effort is normal. No respiratory distress.  Abdominal:     General: Bowel sounds are normal.     Palpations: Abdomen is soft.     Tenderness: There is no abdominal tenderness.     Comments: Small amount of urine noted at suprapubic site  Musculoskeletal:     Cervical back: Neck supple.  Skin:    General: Skin is warm and dry.  Neurological:     Mental Status: She is alert and oriented to person, place, and time.  Psychiatric:        Mood and Affect: Mood normal.     ED Results / Procedures / Treatments   Labs (all labs ordered are listed, but only abnormal results are displayed) Labs Reviewed - No data to display  EKG EKG Interpretation Date/Time:  Tuesday February 20 2023 03:25:06 EST Ventricular Rate:  87 PR Interval:  132 QRS Duration:  130 QT Interval:  379 QTC Calculation: 448 R Axis:   7  Text Interpretation: Sinus with frequent PACs Right bundle branch block Confirmed by Mary Marcus ([id number]) on 02/20/2023 3:53:09 AM  Radiology No results found.  Procedures SUPRAPUBIC  TUBE PLACEMENT  Date/Time: 02/20/2023 4:03 AM  Performed by: Shon Baton, MD Authorized by: Shon Baton, MD   Consent:    Consent obtained:  Verbal   Consent given by:  Patient   Risks, benefits, and alternatives were discussed: yes     Risks discussed:  Infection Universal protocol:    Patient identity confirmed:  Verbally with patient Sedation:    Sedation type:  None Anesthesia:    Anesthesia method:  None Procedure details:    Complexity:  Simple   Catheter type:  Foley   Catheter size:  18 Fr   Ultrasound guidance: no     Number of attempts:  1   Urine characteristics:  Clear Post-procedure details:    Procedure completion:  Tolerated     Medications Ordered in ED Medications - No data to display  ED Course/ Medical Decision Making/ A&P                                 Medical Decision Making  This patient presents to the ED for concern of catheter dislodgment, this involves an extensive number of treatment options, and is a complaint that carries with it a high risk of complications and morbidity.  I considered the following differential and admission for this acute, potentially life threatening condition.  The differential diagnosis includes catheter malfunction, dislodgment  MDM:    This is a 89 year old female who presents with suprapubic catheter that had become dislodged.  She has no other complaints.  Initially thought to be tachycardic but when placed on the monitor showed sinus rhythm.  EKG is reassuring.  She has no other complaints.  Catheter was exchanged at the bedside without difficulty and had good clear urine output into Foley bag.  Will have her follow-up with urology.  (Labs, imaging, consults)  Labs: I Ordered, and personally interpreted labs.  The pertinent results include: None  Imaging Studies ordered: I ordered imaging studies including none I independently visualized and interpreted imaging. I agree with the radiologist  interpretation  Additional history obtained from chart review.  External records from outside source obtained and reviewed including prior evaluations  Cardiac Monitoring: The patient was maintained on a cardiac monitor.  If on the cardiac monitor, I personally viewed and interpreted the cardiac monitored which showed an underlying rhythm of: Sinus  Reevaluation: After the interventions noted above, I reevaluated the patient and found that they have :improved  Social Determinants of Health:  lives independently  Disposition: Discharge  Co morbidities that complicate the patient evaluation  Past Medical History:  Diagnosis Date   Alopecia    Ashkenazi Jewish ancestry    Cancer [facility])    Dyspnea    External hemorrhoid    Foley catheter in place    Heart palpitations no cardiologist--  monitored by pcp   per pt "has been on verapamil and lanoxin for years and no palpitations for a very long time"   History of acute bronchitis    dx 12-21-2014  finished [facility]PAK   History of adenomatous polyp of colon    2003   History of basal cell carcinoma excision    2006  left nasolabial fold   History of benign colon tumor    2007  --  HIGH GRADE HYPERPLASTIC ,  S/P  LEFT HEMICOLECTOMY   History of breast cancer ONCOLOGIST-  DR Darnelle Catalan--  antiestogen therapy with femera completed 12/  2012--  no recurrence   dx 10/  2007  right breast Invasive DCIS, grade 2, Stage 2A, pT2  pN0 pMX,  (ER 100% /PR 3% +),  HER +3) with 1 metastatic axill node---  s/p  right mastectomy    History of kidney stones    Hypertension    Hypotonic bladder UROLOGIST-  DR Patsi Sears   Mixed hyperlipidemia    OA (osteoarthritis)    hip   Osteoporosis    DEXA 01/2009; T-3.2 L fem neck, -2.4 L radius   Urinary retention    02-13-2015     Medicines No orders of the defined types were placed in this encounter.  I have reviewed the patients home medicines and have made adjustments as needed  Problem List / ED  Course: Problem List Items Addressed This Visit   None Visit Diagnoses     Complication of Foley catheter, initial encounter (HCC)    -  Primary                   Final Clinical Impression(s) / ED Diagnoses Final diagnoses:  Complication of Foley catheter, initial encounter [facility])    Rx / DC Orders ED Discharge Orders     None         Shon Baton, MD 02/20/23 [phone]

## 2023-02-20 NOTE — Discharge Instructions (Signed)
You were seen today and had your suprapubic Foley catheter changed.  Follow-up with urology as scheduled.

## 2023-02-22 DIAGNOSIS — N312 Flaccid neuropathic bladder, not elsewhere classified: Secondary | ICD-10-CM | POA: Diagnosis not present

## 2023-02-23 NOTE — Progress Notes (Unsigned)
  Cardiology Office Note:  .   Date:  02/23/2023  ID:  Mary [name], DOB [birth year], MRN [id number] PCP: Joselyn Arrow, MD  [facility] Providers Cardiologist:  Nanetta Batty, MD {1}   }   History of Present Illness: .   Mary [name] is a 89 y.o. female with history of systolic heart failure (EF 45%, PAT, hypertension, history of invasive breast carcinoma of the right breast with total mastectomy in 2007.  When last seen in the office on 02/14/2023 the patient complained of fatigue, sleeping in recliner due to osteoarthritis.  No medication changes were made at that time.  She was not found to be volume overloaded.  Follow-up BMET was to be planned but she refused.  Will try and order this today.  ROS: ***  Studies Reviewed: .        *** EKG Interpretation Date/Time:    Ventricular Rate:    PR Interval:    QRS Duration:    QT Interval:    QTC Calculation:   R Axis:      Text Interpretation:      Physical Exam:   VS:  LMP  (LMP Unknown)    Wt Readings from Last 3 Encounters:  02/20/23 119 lb (54 kg)  02/14/23 114 lb (51.7 kg)  02/08/23 120 lb 5.9 oz (54.6 kg)    GEN: Well nourished, well developed in no acute distress NECK: No JVD; No carotid bruits CARDIAC: ***RRR, no murmurs, rubs, gallops RESPIRATORY:  Clear to auscultation without rales, wheezing or rhonchi  ABDOMEN: Soft, non-tender, non-distended EXTREMITIES:  No edema; No deformity   ASSESSMENT AND PLAN: .   ***    {Are you ordering a CV Procedure (e.g. stress test, cath, DCCV, TEE, etc)?   Press F2        :[id number]}    Signed, Bettey Mare. Liborio Nixon, ANP, AACC

## 2023-02-26 ENCOUNTER — Ambulatory Visit: Payer: Medicare Other | Admitting: Adult Health

## 2023-02-27 ENCOUNTER — Ambulatory Visit: Payer: Medicare Other | Admitting: Family Medicine

## 2023-02-27 ENCOUNTER — Encounter: Payer: Self-pay | Admitting: Family Medicine

## 2023-02-27 ENCOUNTER — Ambulatory Visit: Payer: Self-pay

## 2023-02-27 VITALS — BP 138/70 | HR 74 | Ht 63.0 in | Wt 115.0 lb

## 2023-02-27 DIAGNOSIS — L03313 Cellulitis of chest wall: Secondary | ICD-10-CM | POA: Diagnosis not present

## 2023-02-27 MED ORDER — BENZONATATE 100 MG PO CAPS: 100.0000 mg | ORAL_CAPSULE | Freq: Three times a day (TID) | ORAL | 0 refills | Status: DC

## 2023-02-27 MED ORDER — DOXYCYCLINE HYCLATE 100 MG PO TABS: 100.0000 mg | ORAL_TABLET | Freq: Two times a day (BID) | ORAL | 0 refills | Status: DC

## 2023-02-27 NOTE — Patient Instructions (Signed)
Visit Information  Thank you for taking time to visit with me today. Please don't hesitate to contact me if I can be of assistance to you.   Following are the goals we discussed today:   Goals Addressed             This Visit's Progress    To better manage CHF       Care Coordination Interventions: Completed call with daughters Rosann Auerbach and Susie Reviewed and discussed patient's recent admission for CHF exacerbation  Review of patient status, including review of consultant's reports, relevant laboratory and other test results, and medications completed Basic overview and discussion of pathophysiology of Heart Failure reviewed Provided education on low sodium diet Reviewed Heart Failure Action Plan in depth and provided written copy Assessed need for readable accurate scales in home Provided education about placing scale on hard, flat surface Advised patient to weigh each morning after emptying bladder Discussed importance of daily weight and advised patient to weigh and record daily Reviewed role of diuretics in prevention of fluid overload and management of heart failure; Discussed the importance of keeping all appointments with provider Advised patient to discuss Palliative Care referral with provider           To undergo cystoscopy and suprapubic catheter placement without complications   On track    Care Coordination Interventions: Completed outbound call with patient's daughters Lynnell Dike and Rosann Auerbach Evaluation of current treatment plan related to urinary retention and patient's adherence to plan as established by provider Reviewed and discussed with daughters, patient completed her 1 month post procedure follow up with Urologist provider Review of patient status, including review of consultant's reports, relevant laboratory and other test results, and medications completed Determined patient experienced one ED visit following following her procedure due to her supra pubic catheter  came out during the nighttime while she was sleeping, it was replaced without complication Determined patient remains to have no current issues with her catheter  Instructed daughter to contact patient's doctor for new symptoms or concerns Patient's daughter will report new symptoms or concerns related to patient suprapubic catheter to her Urologist promptly Patient's caregiver will continue to provide catheter care as directed Patient will continue to take her medications exactly as prescribed Patient and daughters will continue to work with nurse care coordinator for chronic disease management and care coordination needs         Our next appointment is by telephone on 03/13/23 at 12:30 PM  Please call the care guide team at [phone] if you need to cancel or reschedule your appointment.   If you are experiencing a Mental Health or Behavioral Health Crisis or need someone to talk to, please call 1-800-273-TALK (toll free, 24 hour hotline)  Patient verbalizes understanding of instructions and care plan provided today and agrees to view in MyChart. Active MyChart status and patient understanding of how to access instructions and care plan via MyChart confirmed with patient.     Delsa Sale RN BSN CCM [facility]  [facility], [facility] Nurse Care Coordinator  Direct Dial: [phone] Website: [email or web address]

## 2023-02-27 NOTE — Patient Outreach (Addendum)
Care Coordination   Follow Up Visit Note   02/27/2023 Name: Mary [name] MRN: [id number] DOB: [birth year]  Mary [name] is a 89 y.o. year old female who sees Joselyn Arrow, MD for primary care. I spoke with daughters Rosann Auerbach and Susie by phone today.  What matters to the patients health and wellness today?  Patient would like to remain her her home as she grows older and her health declines.     Goals Addressed             This Visit's Progress    To better manage CHF       Care Coordination Interventions: Completed call with daughters Rosann Auerbach and Susie Reviewed and discussed patient's recent admission for CHF exacerbation  Review of patient status, including review of consultant's reports, relevant laboratory and other test results, and medications completed Basic overview and discussion of pathophysiology of Heart Failure reviewed Provided education on low sodium diet Reviewed Heart Failure Action Plan in depth and provided written copy Assessed need for readable accurate scales in home Provided education about placing scale on hard, flat surface Advised patient to weigh each morning after emptying bladder Discussed importance of daily weight and advised patient to weigh and record daily Reviewed role of diuretics in prevention of fluid overload and management of heart failure; Discussed the importance of keeping all appointments with provider Advised patient to discuss Palliative Care referral with provider      To undergo cystoscopy and suprapubic catheter placement without complications   On track    Care Coordination Interventions: Completed outbound call with patient's daughters Lynnell Dike and Rosann Auerbach Evaluation of current treatment plan related to urinary retention and patient's adherence to plan as established by provider Reviewed and discussed with daughters, patient completed her 1 month post procedure follow up with Urologist provider Review of patient status, including review of  consultant's reports, relevant laboratory and other test results, and medications completed Determined patient experienced one ED visit following following her procedure due to her supra pubic catheter came out during the nighttime while she was sleeping, it was replaced without complication Determined patient remains to have no current issues with her catheter  Instructed daughter to contact patient's doctor for new symptoms or concerns Patient's daughter will report new symptoms or concerns related to patient suprapubic catheter to her Urologist promptly Patient's caregiver will continue to provide catheter care as directed Patient will continue to take her medications exactly as prescribed Patient and daughters will continue to work with nurse care coordinator for chronic disease management and care coordination needs     Interventions Today    Flowsheet Row Most Recent Value  Chronic Disease   Chronic disease during today's visit Other, Congestive Heart Failure (CHF)  [supra pubic catheter,  rash near clavicle]  General Interventions   General Interventions Discussed/Reviewed General Interventions Discussed, General Interventions Reviewed, Doctor Visits  Doctor Visits Discussed/Reviewed Doctor Visits Reviewed, Doctor Visits Discussed, PCP, Specialist  Education Interventions   Education Provided Provided Education  Provided Verbal Education On When to see the doctor, Medication, Nutrition  Nutrition Interventions   Nutrition Discussed/Reviewed Nutrition Discussed, Nutrition Reviewed, Increasing proteins, Fluid intake  Pharmacy Interventions   Pharmacy Dicussed/Reviewed Pharmacy Topics Reviewed, Pharmacy Topics Discussed, Medications and their functions  Safety Interventions   Safety Discussed/Reviewed Home Safety, Fall Risk, Safety Reviewed, Safety Discussed  Home Safety Assistive Devices      '    SDOH assessments and interventions completed:  No     Care Coordination  Interventions:  Yes, provided   Follow up plan: Follow up call scheduled for 03/13/23 @12 :30 PM    Encounter Outcome:  Patient Visit Completed

## 2023-02-27 NOTE — Addendum Note (Signed)
Addended by: Ronnald Nian on: 02/27/2023 03:30 PM   Modules accepted: Orders

## 2023-02-27 NOTE — Progress Notes (Addendum)
   Subjective:    Patient ID: Mary [name], female    DOB: 05-Jul-1926, 89 y.o.   MRN: [id number]  HPI She complains of a painful erythematous lesion on the mid chest area.   Review of Systems     Objective:    Physical Exam She has an erythematous tender to palpation lesion over the mid upper chest area with a central area of scabbing.  No drainage is noted.       Assessment & Plan:  Cellulitis of chest wall - Plan: doxycycline (VIBRA-TABS) 100 MG tablet Tessalon was also called in at patient request  She has multiple intolerances to medications but none for doxycycline.  A photo was taken and placed on her daughter's cell phone to monitor this.  They will call if further difficulty with it.

## 2023-03-13 ENCOUNTER — Encounter: Payer: Self-pay | Admitting: Licensed Clinical Social Worker

## 2023-03-13 ENCOUNTER — Ambulatory Visit: Payer: Self-pay

## 2023-03-13 NOTE — Patient Outreach (Signed)
  Care Coordination   Collaboration  Visit Note   03/13/2023 Name: Mary [name] MRN: [id number] DOB: 11-Sep-1926  Mary [name] is a 89 y.o. year old female who sees Joselyn Arrow, MD for primary care. I  engaged with RNCM  What matters to the patients health and wellness today?  Patient was not engaged during this encounter   SDOH assessments and interventions completed:  No     Care Coordination Interventions:  Yes, provided  Interventions Today    Flowsheet Row Most Recent Value  General Interventions   General Interventions Discussed/Reviewed Communication with  Communication with RN  [LCSW collaborated with [facility] regarding patient care needs and/or barriers to care]       Follow up plan: Follow up call scheduled for 2-3 weeks    Encounter Outcome:  Patient Visit Completed   [name], MSW, LCSW [facility] Care Management [facility] Health  [facility] [email or web address] Phone [phone] 4:41 PM

## 2023-03-13 NOTE — Patient Outreach (Signed)
  Care Coordination   Follow Up Visit Note   03/13/2023 Name: Naavya Manchester MRN: [id number] DOB: [birth year]  Wray Hibma is a 89 y.o. year old female who sees Joselyn Arrow, MD for primary care. I spoke with daughter Consepcion Hearing by phone today.  What matters to the patients health and wellness today?  Daughter Rosann Auerbach would like for patient to remain healthy and living in her home with caregiver assistance.     Goals Addressed             This Visit's Progress    To better manage CHF   On track    Care Coordination Interventions: Completed call with daughter Rosann Auerbach  Reviewed and discussed patient's next upcoming scheduled appointment with Dr. Allyson Sabal, Cardiologist scheduled for 04/30/23 @1 :30 PM       To undergo cystoscopy and suprapubic catheter placement without complications   On track    Care Coordination Interventions: Completed outbound call with patient's daughter Rosann Auerbach Evaluation of current treatment plan related to urinary retention and patient's adherence to plan as established by provider Determined patient remains to have no current issues with her catheter, patient will have her catheter changed out on 03/22/23 per [facility] Instructed daughter to contact patient's doctor for new symptoms or concerns Patient's daughter will report new symptoms or concerns related to patient suprapubic catheter to her Urologist promptly Patient's caregiver will continue to provide catheter care as directed Patient will continue to take her medications exactly as prescribed Patient and daughters will continue to work with nurse care coordinator for chronic disease management and care coordination needs     Interventions Today    Flowsheet Row Most Recent Value  Chronic Disease   Chronic disease during today's visit Other  [suprapubic catheter]  General Interventions   General Interventions Discussed/Reviewed General Interventions Discussed, General Interventions Reviewed, Doctor  Visits  Doctor Visits Discussed/Reviewed Doctor Visits Reviewed, Doctor Visits Discussed, Specialist  Education Interventions   Education Provided Provided Education  Provided Verbal Education On When to see the doctor, Medication  Pharmacy Interventions   Pharmacy Dicussed/Reviewed Pharmacy Topics Reviewed, Pharmacy Topics Discussed  Safety Interventions   Safety Discussed/Reviewed Home Safety, Fall Risk, Safety Reviewed, Safety Discussed          SDOH assessments and interventions completed:  No     Care Coordination Interventions:  Yes, provided   Follow up plan: Follow up call scheduled for 05/02/23 @2 :30 PM    Encounter Outcome:  Patient Visit Completed

## 2023-03-13 NOTE — Patient Instructions (Signed)
Visit Information  Thank you for taking time to visit with me today. Please don't hesitate to contact me if I can be of assistance to you.   Following are the goals we discussed today:   Goals Addressed             This Visit's Progress    To better manage CHF   On track    Care Coordination Interventions: Completed call with daughter Rosann Auerbach  Reviewed and discussed patient's next upcoming scheduled appointment with Dr. Allyson Sabal, Cardiologist scheduled for 04/30/23 @1 :30 PM       To undergo cystoscopy and suprapubic catheter placement without complications   On track    Care Coordination Interventions: Completed outbound call with patient's daughter Rosann Auerbach Evaluation of current treatment plan related to urinary retention and patient's adherence to plan as established by provider Determined patient remains to have no current issues with her catheter, patient will have her catheter changed out on 03/22/23 per [facility] Instructed daughter to contact patient's doctor for new symptoms or concerns Patient's daughter will report new symptoms or concerns related to patient suprapubic catheter to her Urologist promptly Patient's caregiver will continue to provide catheter care as directed Patient will continue to take her medications exactly as prescribed Patient and daughters will continue to work with nurse care coordinator for chronic disease management and care coordination needs         Our next appointment is by telephone on 05/02/23 at 2:30 PM  Please call the care guide team at [phone] if you need to cancel or reschedule your appointment.   If you are experiencing a Mental Health or Behavioral Health Crisis or need someone to talk to, please call 1-800-273-TALK (toll free, 24 hour hotline)  Patient verbalizes understanding of instructions and care plan provided today and agrees to view in MyChart. Active MyChart status and patient understanding of how to access instructions  and care plan via MyChart confirmed with patient.     Delsa Sale RN BSN CCM [place]  [facility], [facility] Nurse Care Coordinator  Direct Dial: [phone] Website: [email or web address]

## 2023-03-13 NOTE — Patient Instructions (Signed)
Visit Information  Thank you for taking time to visit with me today. Please don't hesitate to contact me if I can be of assistance to you.   Following are the goals we discussed today:   Goals Addressed   None     Our next appointment is by telephone on 1/14  Please call the care guide team at [phone] if you need to cancel or reschedule your appointment.   If you are experiencing a Mental Health or Behavioral Health Crisis or need someone to talk to, please call the Suicide and Crisis Lifeline: 988 call 911   Patient verbalizes understanding of instructions and care plan provided today and agrees to view in MyChart. Active MyChart status and patient understanding of how to access instructions and care plan via MyChart confirmed with patient.     [name], MSW, LCSW [facility] Care Management [place]  [facility] [email or web address] Phone [phone] 4:42 PM

## 2023-03-22 DIAGNOSIS — N312 Flaccid neuropathic bladder, not elsewhere classified: Secondary | ICD-10-CM | POA: Diagnosis not present

## 2023-03-30 ENCOUNTER — Telehealth: Payer: Self-pay

## 2023-03-30 NOTE — Telephone Encounter (Signed)
 We do not prescribe sleeping medications (ie ambien) to people this age, due to fall risks. We have to make sure that she is moving around and active during the day, not napping, in order to feel tired at night. She can try taking melatonin 2.5-5 mg 2-3 hours before bedtime, and repeat the dose at bedtime if not sleepy.  We can discuss other options if needed, at a visit, to review sleep hygiene, and risks/side effects of meds.

## 2023-03-30 NOTE — Telephone Encounter (Signed)
 Daughter called and said pt was having some trouble sleeping. She is not taking gabapentin because of the side effects. Would like some recommendations to help with sleep. Either [place] or OTC. Also wanted you to know that she is in town.

## 2023-04-02 NOTE — Telephone Encounter (Signed)
 Spoke with Mary [name] and gave her all information.

## 2023-04-02 NOTE — Telephone Encounter (Signed)
 Called Mary [name], no answer, left voicemail.

## 2023-04-03 ENCOUNTER — Ambulatory Visit: Payer: Self-pay | Admitting: Licensed Clinical Social Worker

## 2023-04-06 NOTE — Patient Outreach (Signed)
  Care Coordination   Follow Up Visit Note   04/03/2023 Name: Mary [name] MRN: [id number] DOB: Jun 05, 1926  Mary [name] is a 89 y.o. year old female who sees Randol Dawes, MD for primary care. I spoke with  Emon Mian's daughters, Avelina and [name] by phone today.  What matters to the patients health and wellness today?  Symptom Management, Level of Care, Supportive Resources    Goals Addressed             This Visit's Progress    Obtain Supportive Resources   On track    Activities and task to complete in order to accomplish goals.   Keep all upcoming appointments discussed today Continue with compliance of taking medication prescribed by Doctor Implement healthy coping skills discussed to assist with management of symptoms. Have patient try melatonin gummies, as recommended by PCP to assist with sleep  Continue working with [facility] care team to assist with goals identified         SDOH assessments and interventions completed:  No     Care Coordination Interventions:  Yes, provided  Interventions Today    Flowsheet Row Most Recent Value  Chronic Disease   Chronic disease during today's visit Hypertension (HTN), Congestive Heart Failure (CHF), Other  [Difficulty sleeping]  General Interventions   General Interventions Discussed/Reviewed General Interventions Reviewed, Doctor Visits, Level of Care  [Pt was having difficulty falling asleep and restless leg, not wanting to take gabapentin . Will send mom melatonin gummies (3 mg) Family are looking at options for hearing devices to assist pt with communication]  Doctor Visits Discussed/Reviewed Doctor Visits Reviewed  [facility] are not currently interested in [facility], prefers to remain with PCP due to great pt care]  Level of Care --  [Pt is doing well with current regimen family has put in place. Discussed AL options vs remaining at current residence]  Mental Health Interventions   Mental Health Discussed/Reviewed Mental Health  Reviewed, Coping Strategies, Anxiety, Depression  [Family continue to rotate with visiting pt and has family who resides locally]  Nutrition Interventions   Nutrition Discussed/Reviewed Nutrition Reviewed  [Pt observed with decreased appetite]  Pharmacy Interventions   Pharmacy Dicussed/Reviewed Pharmacy Topics Reviewed, Medication Adherence  Safety Interventions   Safety Discussed/Reviewed Safety Reviewed       Follow up plan: Follow up call scheduled for 4-6 weeks    Encounter Outcome:  Patient Visit Completed   Rolin Kerns, LCSW [place]  [facility], [facility] Clinical Social Worker Direct Dial: [phone]  Fax: [phone] Website: [email or web address] 8:01 AM

## 2023-04-06 NOTE — Patient Instructions (Signed)
Visit Information  Thank you for taking time to visit with me today. Please don't hesitate to contact me if I can be of assistance to you before our next scheduled telephone appointment.  Following are the goals we discussed today:  (Copy and paste patient goals from clinical care plan here)  Our next appointment is by telephone on 3/11 at 1:30 PM  Please call the care guide team at [phone] if you need to cancel or reschedule your appointment.   If you are experiencing a Mental Health or Behavioral Health Crisis or need someone to talk to, please call the Suicide and Crisis Lifeline: 988 call 911   Patient verbalizes understanding of instructions and care plan provided today and agrees to view in MyChart. Active MyChart status and patient understanding of how to access instructions and care plan via MyChart confirmed with patient.     Windy Fast [facility] Health  [facility], [facility] Clinical Social Worker Direct Dial: [phone]  Fax: [phone] Website: [email or web address] 8:01 AM

## 2023-04-19 DIAGNOSIS — N312 Flaccid neuropathic bladder, not elsewhere classified: Secondary | ICD-10-CM | POA: Diagnosis not present

## 2023-04-27 DIAGNOSIS — M1611 Unilateral primary osteoarthritis, right hip: Secondary | ICD-10-CM | POA: Diagnosis not present

## 2023-04-30 ENCOUNTER — Encounter: Payer: Self-pay | Admitting: Cardiovascular Disease

## 2023-04-30 ENCOUNTER — Ambulatory Visit: Payer: Medicare Other | Attending: Cardiovascular Disease | Admitting: Cardiovascular Disease

## 2023-04-30 VITALS — BP 159/78 | HR 58 | Ht 65.0 in | Wt 115.0 lb

## 2023-04-30 DIAGNOSIS — I5043 Acute on chronic combined systolic (congestive) and diastolic (congestive) heart failure: Secondary | ICD-10-CM | POA: Insufficient documentation

## 2023-04-30 DIAGNOSIS — I10 Essential (primary) hypertension: Secondary | ICD-10-CM | POA: Diagnosis not present

## 2023-04-30 DIAGNOSIS — E782 Mixed hyperlipidemia: Secondary | ICD-10-CM | POA: Diagnosis not present

## 2023-04-30 NOTE — Assessment & Plan Note (Signed)
 History of hyperlipidemia on statin therapy with lipid profile performed 07/26/2022 revealing total cholesterol 188, LDL 74 and HDL of 95.

## 2023-04-30 NOTE — Patient Instructions (Addendum)
 Medication Instructions:  Your physician recommends that you continue on your current medications as directed. Please refer to the Current Medication list given to you today.  *If you need a refill on your cardiac medications before your next appointment, please call your pharmacy*    Follow-Up: At [facility], you and your health needs are our priority.  As part of our continuing mission to provide you with exceptional heart care, we have created designated Provider Care Teams.  These Care Teams include your primary Cardiologist (physician) and Advanced Practice Providers (APPs -  Physician Assistants and Nurse Practitioners) who all work together to provide you with the care you need, when you need it.  We recommend signing up for the patient portal called "MyChart".  Sign up information is provided on this After Visit Summary.  MyChart is used to connect with patients for Virtual Visits (Telemedicine).  Patients are able to view lab/test results, encounter notes, upcoming appointments, etc.  Non-urgent messages can be sent to your provider as well.   To learn more about what you can do with MyChart, go to [email or web address].    Your next appointment:   3 month(s)  Provider:   Friddie Jetty, DNP, ANP       Then, Lauro Portal, MD will plan to see you again in 6 month(s).    Other Instructions

## 2023-04-30 NOTE — Assessment & Plan Note (Signed)
 Patient has been hospitalized recently with volume overload and heart failure.  Her 2D echoes have revealed an EF of 45 to 50% which has been fairly stable performed 02/04/2023.  There is mildly elevated pulmonary artery pressures and normal valvular function.  She is on furosemide  3 times a week.  She does not appear to be volume overloaded today.

## 2023-04-30 NOTE — Assessment & Plan Note (Signed)
 History of essential hypertension with blood pressure measured today at 159/78.  Her last 2 blood pressures in the office were in the 140/70 range.  She is on metoprolol  and spironolactone .

## 2023-04-30 NOTE — Progress Notes (Signed)
 04/30/2023 Mary [name]   02/11/1927  [id number]  Primary Physician Roosvelt Colla, MD Primary Cardiologist: Avanell Leigh MD Bennye Bravo, [place]  HPI:  Mary [name] is a 89 y.o. thin-appearing widowed Caucasian female mother of 3, grandmother of 5 grandchildren and great-grandmother to 43 great-grandchildren is accompanied by one of her daughters Mary [name] today who is from [place].  Her brother Mary [name]  and nephew Mary [name] are both patients of mine.  She has a history of treated hypertension and hyperlipidemia.  She is never had a heart attack or stroke.  She does live alone but has 24/7 help.  She walks with a walker at home.  She does get occasional shortness of breath but denies chest pain.   Current Meds  Medication Sig   Acetaminophen  500 MG capsule Take 1,000 mg by mouth 2 (two) times daily after a meal.   atorvastatin  (LIPITOR) 40 MG tablet TAKE 1 TABLET(40 MG) BY MOUTH DAILY (Patient taking differently: Take 40 mg by mouth every evening.)   benzonatate  (TESSALON  PERLES) 100 MG capsule Take 1 capsule (100 mg total) by mouth 3 (three) times daily.   bisacodyl  (DULCOLAX) 5 MG EC tablet Take 5-15 mg by mouth See admin instructions. Take 5 mg by mouth once a day and 15 mg once daily for constipation   Cholecalciferol  (VITAMIN D3) 50 MCG (2000 UT) TABS Take 2,000 Units by mouth daily.   denosumab  (PROLIA ) 60 MG/ML SOLN injection Inject 60 mg into the skin every 6 (six) months. Administer in upper arm, thigh, or abdomen   diazepam  (VALIUM ) 2 MG tablet TAKE 1 TABLET(2 MG) BY MOUTH EVERY 8 HOURS AS NEEDED FOR MUSCLE SPASMS   doxycycline  (VIBRA -TABS) 100 MG tablet Take 1 tablet (100 mg total) by mouth 2 (two) times daily.   fluticasone (CUTIVATE) 0.005 % ointment Apply 1 Application topically daily as needed (irritation- affected area).   furosemide  (LASIX ) 20 MG tablet Take 1 tablet (20 mg total) by mouth 3 (three) times a week.   gabapentin  (NEURONTIN ) 100 MG capsule Take  100 mg by mouth daily.   LITHOSTAT 250 MG TABS Take 250 mg by mouth See admin instructions. Take 250 mg by mouth once a day- cut into TWO halves and taken together   loratadine  (CLARITIN ) 10 MG tablet Take 10 mg by mouth daily.   metoprolol  succinate (TOPROL -XL) 50 MG 24 hr tablet Take 1 tablet (50 mg total) by mouth daily. Take with or immediately following a meal.   Multiple Vitamins-Minerals (PRESERVISION AREDS 2) CAPS Take 1 capsule by mouth 2 (two) times daily.   ondansetron  (ZOFRAN  ODT) 4 MG disintegrating tablet Take 1 tablet (4 mg total) by mouth every 8 (eight) hours as needed for nausea or vomiting.   Polyvinyl Alcohol -Povidone (REFRESH OP) Place 1 drop into both eyes 2 (two) times daily.   spironolactone  (ALDACTONE ) 25 MG tablet Take 0.5 tablets (12.5 mg total) by mouth daily.   traMADol  (ULTRAM ) 50 MG tablet Take 25-50 mg by mouth every 8 (eight) hours as needed (for pain).   triamcinolone  cream (KENALOG ) 0.1 % Apply 1 Application topically 2 (two) times daily as needed (for itching).     Allergies  Allergen Reactions   Gabapentin  Other (See Comments)    Nightmares    Arimidex [Anastrozole] Rash   Avelox [Moxifloxacin Hcl In Nacl] Nausea Only and Rash   Azithromycin  Nausea Only and Rash   Bactrim Nausea Only and Rash   Biaxin [Clarithromycin] Nausea Only  and Rash   Nitrofurantoin Nausea Only, Rash and Other (See Comments)   Penicillins Nausea Only and Rash   Sulfa Antibiotics Nausea And Vomiting, Rash and Other (See Comments)    GI intolerance   Sulfur Nausea And Vomiting and Rash    Social History   Socioeconomic History   Marital status: Widowed    Spouse name: Not on file   Number of children: 3   Years of education: Not on file   Highest education level: Not on file  Occupational History   Not on file  Tobacco Use   Smoking status: Never   Smokeless tobacco: Never  Vaping Use   Vaping status: Never Used  Substance and Sexual Activity   Alcohol  use: Never    Drug use: No   Sexual activity: Not Currently    Birth control/protection: Post-menopausal  Other Topics Concern   Not on file  Social History Narrative   Lives alone, no pets.   Children live in [place] and [place]. She has 1 son and 2 daughters; nephew lives in [place].   Widowed 1992/08/20   Her brother passed away (had moved to [place] same time she did)      She no longer drives.   Has caregiver Mary [name] during the week (mornings, sometimes until 4), somebody on Saturday and 08-21-2023 evenings (sometimes).      Updated August 21, 2026   Social Drivers of Health   Financial Resource Strain: Not on file  Food Insecurity: No Food Insecurity (02/04/2023)   Hunger Vital Sign    Worried About Running Out of Food in the Last Year: Never true    Ran Out of Food in the Last Year: Never true  Transportation Needs: No Transportation Needs (02/04/2023)   PRAPARE - Administrator, Civil Service (Medical): No    Lack of Transportation (Non-Medical): No  Physical Activity: Not on file  Stress: Not on file  Social Connections: Not on file  Intimate Partner Violence: Not At Risk (02/04/2023)   Humiliation, Afraid, Rape, and Kick questionnaire    Fear of Current or Ex-Partner: No    Emotionally Abused: No    Physically Abused: No    Sexually Abused: No     Review of Systems: General: negative for chills, fever, night sweats or weight changes.  Cardiovascular: negative for chest pain, dyspnea on exertion, edema, orthopnea, palpitations, paroxysmal nocturnal dyspnea or shortness of breath Dermatological: negative for rash Respiratory: negative for cough or wheezing Urologic: negative for hematuria Abdominal: negative for nausea, vomiting, diarrhea, bright red blood per rectum, melena, or hematemesis Neurologic: negative for visual changes, syncope, or dizziness All other systems reviewed and are otherwise negative except as noted above.    Blood pressure (!) 159/78, pulse (!) 58, height 5\' 5"   (1.651 m), weight 115 lb (52.2 kg), SpO2 96%.  General appearance: alert and no distress Neck: no adenopathy, no carotid bruit, no JVD, supple, symmetrical, trachea midline, and thyroid  not enlarged, symmetric, no tenderness/mass/nodules Lungs: clear to auscultation bilaterally Heart: regular rate and rhythm, S1, S2 normal, no murmur, click, rub or gallop Extremities: extremities normal, atraumatic, no cyanosis or edema Pulses: 2+ and symmetric Skin: Skin color, texture, turgor normal. No rashes or lesions Neurologic: Grossly normal  EKG not performed today      ASSESSMENT AND PLAN:   Mixed hyperlipidemia History of hyperlipidemia on statin therapy with lipid profile performed 07/26/2022 revealing total cholesterol 188, LDL 74 and HDL of 95.  Primary hypertension History of essential  hypertension with blood pressure measured today at 159/78.  Her last 2 blood pressures in the office were in the 140/70 range.  She is on metoprolol  and spironolactone .  Acute on chronic combined systolic and diastolic CHF (congestive heart failure) (HCC) Patient has been hospitalized recently with volume overload and heart failure.  Her 2D echoes have revealed an EF of 45 to 50% which has been fairly stable performed 02/04/2023.  There is mildly elevated pulmonary artery pressures and normal valvular function.  She is on furosemide  3 times a week.  She does not appear to be volume overloaded today.     Avanell Leigh MD FACP,FACC,FAHA, [facility] 04/30/2023 1:59 PM

## 2023-05-02 ENCOUNTER — Ambulatory Visit: Payer: Self-pay

## 2023-05-02 NOTE — Patient Instructions (Signed)
Visit Information  Thank you for taking time to visit with me today. Please don't hesitate to contact me if I can be of assistance to you.   Following are the goals we discussed today:   Goals Addressed             This Visit's Progress    To better manage CHF   On track    Care Coordination Interventions: Basic overview and discussion of pathophysiology of Heart Failure reviewed Provided education on low sodium diet Reviewed Heart Failure Action Plan in depth and provided written copy Assessed need for readable accurate scales in home Provided education about placing scale on hard, flat surface Advised patient to weigh each morning after emptying bladder Discussed importance of daily weight and advised patient to weigh and record daily        To undergo cystoscopy and suprapubic catheter placement without complications   On track    Care Coordination Interventions: Completed outbound call with patient's daughter Rosann Auerbach Evaluation of current treatment plan related to urinary retention and patient's adherence to plan as established by provider Discussed Urology recommendations for trial start of a new medication to help treat/prevent spasms and UTI; Answered daughters questions  Encouraged daughter to contact patient's health plan to determined which medications are covered and or alternatives Encouraged daughter to discuss ongoing concerns with patients Urologist  Patient's daughter will report new symptoms or concerns related to patient suprapubic catheter to her Urologist promptly Patient's caregiver will continue to provide catheter care as directed Patient will continue to take her medications exactly as prescribed Patient and daughters will continue to work with nurse care coordinator for chronic disease management and care coordination needs         Our next appointment is by telephone on 07/02/23 at 2:30 PM  Please call the care guide team at [phone] if you need to  cancel or reschedule your appointment.   If you are experiencing a Mental Health or Behavioral Health Crisis or need someone to talk to, please call 1-800-273-TALK (toll free, 24 hour hotline)  Patient verbalizes understanding of instructions and care plan provided today and agrees to view in MyChart. Active MyChart status and patient understanding of how to access instructions and care plan via MyChart confirmed with patient.     Delsa Sale RN BSN CCM [place]  [facility], [facility] Nurse Care Coordinator  Direct Dial: [phone] Website: [email or web address]

## 2023-05-02 NOTE — Patient Outreach (Addendum)
  Care Coordination   Follow Up Visit Note   05/02/2023 Name: Mary [name] MRN: [id number] DOB: 08-May-1926  Mary [name] is a 89 y.o. year old female who sees Joselyn Arrow, MD for primary care. I spoke with daughters Mary [name] and Mary [name] by phone today.  What matters to the patients health and wellness today?  Patient would like to get resolution from having bladder spasms and prevent UTI.     Goals Addressed             This Visit's Progress    To better manage CHF   On track    Care Coordination Interventions: Basic overview and discussion of pathophysiology of Heart Failure reviewed Provided education on low sodium diet Reviewed Heart Failure Action Plan in depth and provided written copy Assessed need for readable accurate scales in home Provided education about placing scale on hard, flat surface Advised patient to weigh each morning after emptying bladder Discussed importance of daily weight and advised patient to weigh and record daily        To undergo cystoscopy and suprapubic catheter placement without complications   On track    Care Coordination Interventions: Completed outbound call with patient's daughter Mary [name] Evaluation of current treatment plan related to urinary retention and patient's adherence to plan as established by provider Discussed Urology recommendations for trial start of a new medication to help treat/prevent spasms and UTI; Answered daughters questions  Encouraged daughter to contact patient's health plan to determined which medications are covered and or alternatives Encouraged daughter to discuss ongoing concerns with patients Urologist  Patient's daughter will report new symptoms or concerns related to patient suprapubic catheter to her Urologist promptly Patient's caregiver will continue to provide catheter care as directed Patient will continue to take her medications exactly as prescribed Patient and daughters will continue to work with nurse care  coordinator for chronic disease management and care coordination needs     Interventions Today    Flowsheet Row Most Recent Value  Chronic Disease   Chronic disease during today's visit Congestive Heart Failure (CHF), Other  [impaired urinary elimination]  General Interventions   General Interventions Discussed/Reviewed General Interventions Discussed, General Interventions Reviewed, Doctor Visits, Labs, Durable Medical Equipment (DME)  Doctor Visits Discussed/Reviewed Doctor Visits Discussed, Doctor Visits Reviewed, Specialist  Durable Medical Equipment (DME) Other  Demetrius Charity scales]  Education Interventions   Education Provided Provided Education, Provided Printed Education  Provided Verbal Education On When to see the doctor, Nutrition, Medication  Nutrition Interventions   Nutrition Discussed/Reviewed Nutrition Discussed, Nutrition Reviewed, Decreasing salt  Pharmacy Interventions   Pharmacy Dicussed/Reviewed Pharmacy Topics Reviewed, Pharmacy Topics Discussed, Medications and their functions          SDOH assessments and interventions completed:  Yes  SDOH Interventions Today    Flowsheet Row Most Recent Value  SDOH Interventions   Food Insecurity Interventions Intervention Not Indicated  Housing Interventions Intervention Not Indicated  Transportation Interventions Intervention Not Indicated  Utilities Interventions Intervention Not Indicated        Care Coordination Interventions:  Yes, provided   Follow up plan: Follow up call scheduled for 07/02/23 @2 :30 PM    Encounter Outcome:  Patient Visit Completed

## 2023-05-04 ENCOUNTER — Telehealth: Payer: Self-pay | Admitting: Family Medicine

## 2023-05-04 NOTE — Telephone Encounter (Signed)
Helmut Muster called asking if it is ok for Mary [name] to take magnesium for her leg cramps? She states the gabapentin isn't working.

## 2023-05-04 NOTE — Telephone Encounter (Signed)
Patient advised.

## 2023-05-04 NOTE — Telephone Encounter (Signed)
A low dose supplement would be okay.  She has had elevated Mg levels in the past, so we have to be careful.

## 2023-05-15 ENCOUNTER — Other Ambulatory Visit: Payer: Self-pay

## 2023-05-15 ENCOUNTER — Telehealth: Payer: Self-pay | Admitting: Family Medicine

## 2023-05-15 ENCOUNTER — Other Ambulatory Visit: Payer: Self-pay | Admitting: Family Medicine

## 2023-05-15 MED ORDER — SPIRONOLACTONE 25 MG PO TABS: 12.5000 mg | ORAL_TABLET | Freq: Every day | ORAL | 3 refills | Status: DC

## 2023-05-15 NOTE — Telephone Encounter (Signed)
 Advise pt that taking the 2 together will increase the sedating effects. Since she only rarely uses the valium, best if she doesn't take the gabapentin that day

## 2023-05-15 NOTE — Telephone Encounter (Signed)
 Copied from CRM [id number]. Topic: Clinical - Medication Question >> May 15, 2023  9:43 AM Tiffany B wrote: Reason for CRM:  Caller would like to know if patient can take gabapentin (NEURONTIN) 100 MG capsule while taking  diazepam (VALIUM) 2 MG tablet [Pharmacy Med Name: DIAZEPAM.

## 2023-05-15 NOTE — Telephone Encounter (Signed)
 Is this okay to refill?

## 2023-05-15 NOTE — Telephone Encounter (Signed)
 Called [name] and left message.

## 2023-05-15 NOTE — Telephone Encounter (Signed)
 This message was sent to the clinical pool. I am forwarding to you. Thanks.

## 2023-05-16 DIAGNOSIS — M542 Cervicalgia: Secondary | ICD-10-CM | POA: Diagnosis not present

## 2023-05-17 DIAGNOSIS — N312 Flaccid neuropathic bladder, not elsewhere classified: Secondary | ICD-10-CM | POA: Diagnosis not present

## 2023-05-17 DIAGNOSIS — R338 Other retention of urine: Secondary | ICD-10-CM | POA: Diagnosis not present

## 2023-05-22 DIAGNOSIS — M25552 Pain in left hip: Secondary | ICD-10-CM | POA: Diagnosis not present

## 2023-05-22 DIAGNOSIS — M1611 Unilateral primary osteoarthritis, right hip: Secondary | ICD-10-CM | POA: Diagnosis not present

## 2023-05-22 DIAGNOSIS — M5412 Radiculopathy, cervical region: Secondary | ICD-10-CM | POA: Diagnosis not present

## 2023-05-22 DIAGNOSIS — M7912 Myalgia of auxiliary muscles, head and neck: Secondary | ICD-10-CM | POA: Diagnosis not present

## 2023-05-22 DIAGNOSIS — K5900 Constipation, unspecified: Secondary | ICD-10-CM | POA: Diagnosis not present

## 2023-05-29 ENCOUNTER — Ambulatory Visit: Payer: Self-pay | Admitting: Licensed Clinical Social Worker

## 2023-05-30 NOTE — Patient Outreach (Signed)
 Care Coordination   Follow Up Visit Note   05/29/2023 Name: Mary [name] MRN: [id number] DOB: December 05, 1926  Mary [name] is a 89 y.o. year old female who sees Mary Arrow, MD for primary care. I spoke with  Mary [name] daughter, Mary [name], by phone today.  What matters to the patients health and wellness today?  Level of Care    Goals Addressed             This Visit's Progress    COMPLETED: Obtain Supportive Resources   On track    Activities and task to complete in order to accomplish goals.   Keep all upcoming appointments discussed today Continue with compliance of taking medication prescribed by Doctor Implement healthy coping skills discussed to assist with management of symptoms. Have patient try melatonin gummies, as recommended by PCP to assist with sleep  Continue working with [facility] care team to assist with goals identified         SDOH assessments and interventions completed:  No     Care Coordination Interventions:  Yes, provided  Interventions Today    Flowsheet Row Most Recent Value  Chronic Disease   Chronic disease during today's visit Congestive Heart Failure (CHF)  General Interventions   General Interventions Discussed/Reviewed General Interventions Reviewed, Doctor Visits, Level of Care  Doctor Visits Discussed/Reviewed Doctor Visits Reviewed  Mental Health Interventions   Mental Health Discussed/Reviewed Mental Health Reviewed, Coping Strategies  [Pt continues to receive 24/7 care and strong support from family, who routinely visits with her]  Nutrition Interventions   Nutrition Discussed/Reviewed Nutrition Reviewed  Pharmacy Interventions   Pharmacy Dicussed/Reviewed Pharmacy Topics Reviewed, Medication Adherence  [Medication management has assisted in managing pain symptoms]  Safety Interventions   Safety Discussed/Reviewed Safety Reviewed       Follow up plan: No further intervention required.   Encounter Outcome:  Patient Visit Completed    [name], LCSW [facility]  [facility], [facility] Clinical Social Worker Direct Dial: [phone]  Fax: [phone] Website: [email or web address] 5:14 PM

## 2023-05-30 NOTE — Patient Instructions (Signed)
 Visit Information  Thank you for taking time to visit with me today. Please don't hesitate to contact me if I can be of assistance to you.   Following are the goals we discussed today:   Goals Addressed             This Visit's Progress    COMPLETED: Obtain Supportive Resources   On track    Activities and task to complete in order to accomplish goals.   Keep all upcoming appointments discussed today Continue with compliance of taking medication prescribed by Doctor Implement healthy coping skills discussed to assist with management of symptoms. Have patient try melatonin gummies, as recommended by PCP to assist with sleep  Continue working with [facility] care team to assist with goals identified         Please call the care guide team at [phone] if you need to cancel or reschedule your appointment.   If you are experiencing a Mental Health or Behavioral Health Crisis or need someone to talk to, please call the Suicide and Crisis Lifeline: 988 call 911   Patient verbalizes understanding of instructions and care plan provided today and agrees to view in MyChart. Active MyChart status and patient understanding of how to access instructions and care plan via MyChart confirmed with patient.     No further follow up required: Family agreed to inform PCP if any needs arise  [name], LCSW [facility]  [facility], [facility] Clinical Social Worker Direct Dial: [phone]  Fax: [phone] Website: [email or web address] 5:15 PM

## 2023-06-01 DIAGNOSIS — G8929 Other chronic pain: Secondary | ICD-10-CM | POA: Diagnosis not present

## 2023-06-01 DIAGNOSIS — K5903 Drug induced constipation: Secondary | ICD-10-CM | POA: Diagnosis not present

## 2023-06-13 DIAGNOSIS — N312 Flaccid neuropathic bladder, not elsewhere classified: Secondary | ICD-10-CM | POA: Diagnosis not present

## 2023-06-28 DIAGNOSIS — K5900 Constipation, unspecified: Secondary | ICD-10-CM | POA: Diagnosis not present

## 2023-06-28 DIAGNOSIS — G894 Chronic pain syndrome: Secondary | ICD-10-CM | POA: Diagnosis not present

## 2023-06-29 ENCOUNTER — Telehealth: Payer: Self-pay | Admitting: Cardiovascular Disease

## 2023-06-29 MED ORDER — METOPROLOL SUCCINATE ER 50 MG PO TB24: 50.0000 mg | ORAL_TABLET | Freq: Every day | ORAL | 3 refills | Status: DC

## 2023-06-29 NOTE — Telephone Encounter (Signed)
*  STAT* If patient is at the pharmacy, call can be transferred to refill team.   1. Which medications need to be refilled? (please list name of each medication and dose if known)   metoprolol succinate (TOPROL-XL) 50 MG 24 hr tablet     2. Would you like to learn more about the convenience, safety, & potential cost savings by using the [facility]? No   3. Are you open to using the [facility] (Type [facility]. ) No   4. Which pharmacy/location (including street and city if local pharmacy) is medication to be sent to?  [id number] - [place], [place] - [address]     5. Do they need a 30 day or 90 day supply? 30 day  Pt is out of medication

## 2023-06-29 NOTE — Telephone Encounter (Signed)
 Pt's medication was sent to pt's pharmacy as requested. Confirmation received.

## 2023-07-01 ENCOUNTER — Encounter: Payer: Self-pay | Admitting: Family Medicine

## 2023-07-01 ENCOUNTER — Encounter: Payer: Self-pay | Admitting: Cardiovascular Disease

## 2023-07-09 ENCOUNTER — Encounter: Payer: Self-pay | Admitting: Family Medicine

## 2023-07-11 DIAGNOSIS — N312 Flaccid neuropathic bladder, not elsewhere classified: Secondary | ICD-10-CM | POA: Diagnosis not present

## 2023-07-23 ENCOUNTER — Telehealth: Payer: Self-pay | Admitting: Adult Health

## 2023-07-23 NOTE — Telephone Encounter (Signed)
 Mary [name] is aware of her rescheduled appointments.

## 2023-07-24 ENCOUNTER — Telehealth: Payer: Self-pay

## 2023-07-26 DIAGNOSIS — H02403 Unspecified ptosis of bilateral eyelids: Secondary | ICD-10-CM | POA: Diagnosis not present

## 2023-07-26 DIAGNOSIS — H524 Presbyopia: Secondary | ICD-10-CM | POA: Diagnosis not present

## 2023-07-26 DIAGNOSIS — H353133 Nonexudative age-related macular degeneration, bilateral, advanced atrophic without subfoveal involvement: Secondary | ICD-10-CM | POA: Diagnosis not present

## 2023-07-26 DIAGNOSIS — H26492 Other secondary cataract, left eye: Secondary | ICD-10-CM | POA: Diagnosis not present

## 2023-07-26 DIAGNOSIS — H52203 Unspecified astigmatism, bilateral: Secondary | ICD-10-CM | POA: Diagnosis not present

## 2023-07-26 DIAGNOSIS — H5000 Unspecified esotropia: Secondary | ICD-10-CM | POA: Diagnosis not present

## 2023-07-26 DIAGNOSIS — H5213 Myopia, bilateral: Secondary | ICD-10-CM | POA: Diagnosis not present

## 2023-07-27 ENCOUNTER — Ambulatory Visit: Payer: Medicare Other | Admitting: Adult Health

## 2023-08-03 ENCOUNTER — Encounter: Payer: Self-pay | Admitting: Family Medicine

## 2023-08-03 ENCOUNTER — Other Ambulatory Visit: Payer: Self-pay | Admitting: Medical

## 2023-08-03 MED ORDER — GABAPENTIN 100 MG PO CAPS: 200.0000 mg | ORAL_CAPSULE | Freq: Every day | ORAL | 0 refills | Status: DC

## 2023-08-08 ENCOUNTER — Inpatient Hospital Stay: Admitting: Adult Health

## 2023-08-08 ENCOUNTER — Encounter: Payer: Self-pay | Admitting: Cardiovascular Disease

## 2023-08-08 ENCOUNTER — Inpatient Hospital Stay

## 2023-08-09 ENCOUNTER — Inpatient Hospital Stay: Payer: Medicare Other

## 2023-08-09 ENCOUNTER — Inpatient Hospital Stay: Payer: Medicare Other | Admitting: Adult Health

## 2023-08-22 DIAGNOSIS — N312 Flaccid neuropathic bladder, not elsewhere classified: Secondary | ICD-10-CM | POA: Diagnosis not present

## 2023-08-23 ENCOUNTER — Inpatient Hospital Stay

## 2023-08-23 ENCOUNTER — Other Ambulatory Visit: Payer: Self-pay | Admitting: Family Medicine

## 2023-08-23 ENCOUNTER — Telehealth: Payer: Self-pay | Admitting: Adult Health

## 2023-08-23 ENCOUNTER — Inpatient Hospital Stay: Admitting: Adult Health

## 2023-08-23 NOTE — Telephone Encounter (Signed)
 Is this okay to refill?

## 2023-08-24 ENCOUNTER — Telehealth: Payer: Self-pay

## 2023-08-29 DIAGNOSIS — M160 Bilateral primary osteoarthritis of hip: Secondary | ICD-10-CM | POA: Diagnosis not present

## 2023-09-01 ENCOUNTER — Other Ambulatory Visit: Payer: Self-pay | Admitting: Medical

## 2023-09-03 ENCOUNTER — Telehealth: Payer: Self-pay | Admitting: Family Medicine

## 2023-09-03 NOTE — Telephone Encounter (Signed)
 Patient states she currently is out of medication. She takes 2 at night for her legs. She is aware of her appt next week, I will refill

## 2023-09-03 NOTE — Telephone Encounter (Signed)
 Ok to refill? Pt has an appt on 6/25 with you

## 2023-09-03 NOTE — Telephone Encounter (Signed)
 Approval has already been sent to Dr. Monnie Anthony in another phone call

## 2023-09-03 NOTE — Telephone Encounter (Signed)
 Copied from CRM [id number]. Topic: Clinical - Medication Refill >> Sep 03, 2023 10:35 AM Carlatta H wrote: Medication: gabapentin  (NEURONTIN ) 100 MG capsule  Has the patient contacted their pharmacy? Yes (Agent: If no, request that the patient contact the pharmacy for the refill. If patient does not wish to contact the pharmacy document the reason why and proceed with request.) (Agent: If yes, when and what did the pharmacy advise?) Advised to contact office  This is the patient's preferred pharmacy:  [id number] [place], [place] - [address] AT [facility] [address] [facility] [address] [place] [address] Phone: [phone] Fax: [phone]  Is this the correct pharmacy for this prescription? Yes If no, delete pharmacy and type the correct one.   Has the prescription been filled recently? No  Is the patient out of the medication? Yes  Has the patient been seen for an appointment in the last year OR does the patient have an upcoming appointment? Yes  Can we respond through MyChart? No  Agent: Please be advised that Rx refills may take up to 3 business days. We ask that you follow-up with your pharmacy.

## 2023-09-11 NOTE — Progress Notes (Unsigned)
 No chief complaint on file.  Mary [name] is a 89 y.o. female who presents for annual wellness visit.    Last seen here in November, with complaints of dry cough and nausea.     Hypertension, palpitations/atrial tachycardia, CHF:  She is under the care of Dr. Court, last seen in 04/2023. She continues on lasix  30 mg 3 days/week, metoprolol  and spironolactone . She has DOE, unchanged. Echo 01/2023 EF 45-50%.  She denies headaches, dizziness, chest pain, no palpitations or edema.  BP Readings from Last 3 Encounters:  04/30/23 (!) 159/78  02/27/23 138/70  02/20/23 (!) 194/85    Restless legs. She has shooting pains in legs when in bed, doesn't occur in the recliner. Also has jumpy legs She was put on gabapentin  in the past, stopped due to nightmares. It was restarted when complaining more of restless legs. She started at 100 mg, and dose was increased to 200 mg at bedtime in 06/2023 (daughter reported she couldn't sleep d/t legs shaking). Daughter had also asked about Mg to help with sleep, and was given info on various formulations. She is currently taking ***  Bilateral hip osteoarthritis:  Under the are of Dr. Bonner. Didn't receive benefit from injections in the past. Pain is managed with tramadol  (3/d), extra strength tylenol , and rarely advil, when needed.  She uses linzess prn for constipation. She last saw Dr. Bonner earlier this month.   Constipation: She uses Linzess prn.  Stool softeners and laxatives (2 pills of each every night) *** Denies blood in the stool, abdominal pain.  Hyperlipidemia follow-up: Patient is reportedly following a low cholesterol diet. Compliant with medications (atorvastatin  40mg ) and denies medication side effects.  At goal on last check, over a year ago. Due for recheck. Lab Results  Component Value Date   CHOL 188 07/26/2022   HDL 95 07/26/2022   LDLCALC 74 07/26/2022   TRIG 111 07/26/2022   CHOLHDL 2.0 07/26/2022     Urinary retention and  bladder stones: under the care of Dr. Nieves. She has suprapubic catheter. She drains it about every 2-3 hours.  She goes to the urologist's office regularly for catheter changes, and sees Dr. Nieves yearly.   In 01/2023 she underwent cystoscopy with cystolitholopaxy and suprapubic tube change (increased size to 28F).  Currently she denies dysuria, bladder or flank pain, cloudy or bloody urine, no odor.    Osteoporosis: She has been taking Prolia  through Dr. Tauna office since 09/2012, now from Dr. Gudena.  Last injection was 07/2022.  I do not see that she received Prolia  in 01/2023 or since then.  She is due for a visit with Dr. Gudena as well (last seen 07/2022). She declines further DEXA scans. She doesn't take Caltrate related to the size of the pill (too hard to swallow).  She has cottage cheese, yogurt and milk daily.  She takes vitamin D  daily.   H/o breast cancer:  Now under the care of Dr. Odean yearly, previously saw Dr. Layla.  She is past due for appointment. She denies any breast concerns or complaints. She no longer wants to have mammograms. She declines exam today.    Immunization History  Administered Date(s) Administered   Fluad Quad(high Dose 65+) 11/27/2018, 12/23/2020   Fluad Trivalent(High Dose 65+) 01/03/2023   Influenza Split 12/01/2010, 12/18/2012, 01/02/2015, 01/02/2015   Influenza, High Dose Seasonal PF 12/13/2016, 12/24/2017, 12/06/2019, 05/25/2020   Influenza-Unspecified 12/08/2013, 12/27/2021   PFIZER(Purple Top)SARS-COV-2 Vaccination 04/24/2019, 05/15/2019, 12/17/2019, 05/25/2020   Pfizer Covid-19 Vaccine  Bivalent Booster 16yrs & up 12/23/2020   Pfizer(Comirnaty)Fall Seasonal Vaccine 12 years and older 12/27/2021, 01/03/2023   Pneumococcal Conjugate-13 04/15/2014   Pneumococcal Polysaccharide-23 12/19/2002, 09/04/2011, 05/25/2020, 04/04/2021   Td 11/19/2003   Tdap 09/04/2011, 04/18/2021   Refuses shingles vaccine. Last Pap smear: 2013 Last  mammogram: 04/2020, declines further Last colonoscopy: 2013, told no further was needed Last DEXA: 04/2019 T-3.2 L radius, -2.9 R fem neck, -1.6 L fem neck; declines further bone density tests Dentist: twice yearly Ophtho: yearly.  Exercise:  Home exercises, no weight-bearing exercise.  Vitamin D  screen normal at 43.04 in 01/2021     Patient Care Team: Randol Dawes, MD as PCP - General (Family Medicine) Court Dorn PARAS, MD as PCP - Cardiology (Cardiology) Magrinat, Sandria BROCKS, MD (Inactive) as Consulting Physician (Oncology) Streck, Sherlean Rush, MD as Consulting Physician (Pediatric Surgery) Chales Idol, MD as Consulting Physician (Urology) Melodi Lerner, MD as Consulting Physician (Orthopedic Surgery) Morgan, Clayborne CROME, RN as [facility] Little, Clayborne CROME, RN Dr. Merlene Dr. Eskridge--urology Dr. Beaulah (just prn, for cerumen removal) Dr. Philipp Dr. Buccini--GI Dr. Ziglar--dentist Dr. Marne  Depression Screening: Flowsheet Row Office Visit from 07/26/2022 in [facility]  PHQ-2 Total Score 0    Falls screen:     07/26/2022   10:29 AM 06/14/2022   11:13 AM 07/18/2021    2:57 PM 04/04/2021   10:39 AM 09/22/2020    2:48 PM  Fall Risk   Falls in the past year? 0 1 1 1  0  Number falls in past yr: 0 0 0 0 0  Comment  06/11/22-fell out of the armchair fell picking up piece of puzzle-now has a grabber    Injury with Fall? 0 0 0 0 0  Comment    last week-bruised right knee. Reached down to get a puzzle piece and fell   Risk for fall due to : No Fall Risks History of fall(s) History of fall(s) No Fall Risks No Fall Risks  Follow up Falls evaluation completed Falls evaluation completed Falls evaluation completed  Falls evaluation completed  Falls evaluation completed      Data saved with a previous flowsheet row definition     Functional Status Survey:           End of Life Discussion:  Patient has a living will and medical  power of attorney, scanned in chart.  DNR     PMH, PSH, SH reviewed and updated     ROS:  The patient denies fever, headaches, chest pain, palpitations, dizziness, syncope, dyspnea on exertion. Denies URI or allergy symptoms. Decline in hearing (improves some after cerumen removed by ENT), Edema per HPI, improved Denies GI or GU complaints, just some chronic constipation, per HPI.   No numbness, tingling, weakness, tremor, abnormal bleeding/bruising, or enlarged lymph nodes. Sees derm.  L knee and R hip pain per HPI.  *** bilat hip, ?knee Moods are good Gets up 2-3x to go to the bathroom, able to get back to sleep. Sometimes feels weak, r/b chocolate milk or carnation drink. Itchy ears. Appetite is better, denies weight changes. Restless legs per HPI    PHYSICAL EXAM:  LMP  (LMP Unknown)   Wt Readings from Last 3 Encounters:  04/30/23 115 lb (52.2 kg)  02/27/23 115 lb (52.2 kg)  02/20/23 119 lb (54 kg)   Pleasant, elderly female, in no distress. She declined getting undressed for a complete exam, declined breast/pelvic exam.  HEENT: conjunctiva and sclera are clear,  EOMI. OP clear, torus pallatini of upper palate noted. TM's and EAC's normal, canals appear normal. Neck: no lymphadenopathy or mass, no thyromegaly or carotid bruit Heart: No murmur. Frequent extra beats/pauses Lungs: clear bilaterally, no wheezes, rales, ronchi Abdomen: soft, nontender, normal bowel sounds. Back: no spinal or CVA tenderness Extremities: wearing compression stockings, has trace to 1+ pitting edema bilaterally. There is a verrucous skin tag on the L forearm. *** Neuro: alert and oriented. Cranial nerves grossly intact. Grossly normal strength. Gait--fairly quick, normal, using walker (watched her ambulate to bathroom mid-visit). *** Psych: full range of affect, and in good spirits.  Normal hygiene, speech, eye contact, grooming.   ***UPDATE heart--ectopy?? Edema? Update skin--L forearm  tag Update if ambulatory vs wheelchair   ASSESSMENT/PLAN:   Add ferritin (dx restless legs) Add Mg, med monitor. (If taking Mg supplement) Consider prevnar-20 Did she ever check to see if she got RSV from pharmacy? If not, needs to get in the Fall  Past due for Prolia --gets thru oncology.  Last given in 07/2022. Doesn't look like she got in November, and she canceled oncology appt--doesn't have any future visit scheduled. Should continue with Prolia --to contact Dr. Gara office.   Discussed monthly self breast exams and yearly mammograms (pt has decided she no longer wants mammograms); at least 30 minutes of aerobic activity at least 5 days/week, weight-bearing exercise 2x/week; proper sunscreen use reviewed; healthy diet, including goals of calcium  and vitamin D  intake and alcohol  recommendations (less than or equal to 1 drink/day) reviewed; regular seatbelt use; changing batteries in smoke detectors.  Immunization recommendations discussed: continue yearly high dose flu shots.  RSV vaccine from the pharmacy in the fall (she will check with pharmacy to see if she already had this). Shingrix recommended, discussed risks/side effects, patient refuses to get this. Prevnar-20 *** COVID boosters discussed (can get q6 mos, due now, vs waiting for updated vaccine in the Fall) Colonoscopy recommendations reviewed, no longer needed unless symptomatic. Patient declines further bone density tests.   MOST form reviewed, DNR.    F/u 6 mos for med check     Medicare Attestation I have personally reviewed: The patient's medical and social history Their use of alcohol , tobacco or illicit drugs Their current medications and supplements The patient's functional ability including ADLs,fall risks, home safety risks, cognitive, and hearing and visual impairment Diet and physical activities Evidence for depression or mood disorders  The patient's weight, height, BMI have been recorded in the  chart.  I have made referrals, counseling, and provided education to the patient based on review of the above and I have provided the patient with a written personalized care plan for preventive services.

## 2023-09-11 NOTE — Patient Instructions (Incomplete)
 HEALTH MAINTENANCE RECOMMENDATIONS:  It is recommended that you get at least 30 minutes of aerobic exercise at least 5 days/week (for weight loss, you may need as much as 60-90 minutes). This can be any activity that gets your heart rate up. This can be divided in 10-15 minute intervals if needed, but try and build up your endurance at least once a week.  Weight bearing exercise is also recommended twice weekly.  Eat a healthy diet with lots of vegetables, fruits and fiber.  Colorful foods have a lot of vitamins (ie green vegetables, tomatoes, red peppers, etc).  Limit sweet tea, regular sodas and alcoholic beverages, all of which has a lot of calories and sugar.  Up to 1 alcoholic drink daily may be beneficial for women (unless trying to lose weight, watch sugars).  Drink a lot of water .  Calcium  recommendations are 1200-1500 mg daily (1500 mg for postmenopausal women or women without ovaries), and vitamin D  1000 IU daily.  This should be obtained from diet and/or supplements (vitamins), and calcium  should not be taken all at once, but in divided doses.  Monthly self breast exams and yearly mammograms for women over the age of 11 is recommended.  Sunscreen of at least SPF 30 should be used on all sun-exposed parts of the skin when outside between the hours of 10 am and 4 pm (not just when at beach or pool, but even with exercise, golf, tennis, and yard work!)  Use a sunscreen that says broad spectrum so it covers both UVA and UVB rays, and make sure to reapply every 1-2 hours.  Remember to change the batteries in your smoke detectors when changing your clock times in the spring and fall. Carbon monoxide detectors are recommended for your home.  Use your seat belt every time you are in a car, and please drive safely and not be distracted with cell phones and texting while driving.   Mary [name] , Thank you for taking time to come for your Medicare Wellness Visit. I appreciate your ongoing  commitment to your health goals. Please review the following plan we discussed and let me know if I can assist you in the future.   This is a list of the screening recommended for you and due dates:  Health Maintenance  Topic Date Due   COVID-19 Vaccine (8 - Pfizer risk 2024-25 season) 07/04/2023   Zoster (Shingles) Vaccine (1 of 2) 03/20/2024*   Flu Shot  10/19/2023   Medicare Annual Wellness Visit  09/11/2024   DTaP/Tdap/Td vaccine (4 - Td or Tdap) 04/19/2031   Pneumococcal Vaccine for age over 58  Completed   DEXA scan (bone density measurement)  Completed   Hepatitis B Vaccine  Aged Out   HPV Vaccine  Aged Out   Meningitis B Vaccine  Aged Out  *Topic was postponed. The date shown is not the original due date.   I recommend getting the RSV vaccine from the pharmacy in the Fall. You can verify with them that you haven't received this yet (and if you have already gotten it, you likely don't need another).  COVID boosters are recommended every 6 months for those over 65 You are eligible to get one now, vs you can wait and get the COVID vaccine when it is updated in September. Continue yearly high dose flu shots. We recommend shingles vaccine--you have declined this. If you change your mind, it is a series of 2 shots, given 2 months apart, and you get  this from the pharmacy.

## 2023-09-12 ENCOUNTER — Encounter: Payer: Self-pay | Admitting: Family Medicine

## 2023-09-12 ENCOUNTER — Ambulatory Visit (INDEPENDENT_AMBULATORY_CARE_PROVIDER_SITE_OTHER): Admitting: Family Medicine

## 2023-09-12 VITALS — BP 132/80 | HR 76 | Ht 65.0 in | Wt 109.8 lb

## 2023-09-12 DIAGNOSIS — M159 Polyosteoarthritis, unspecified: Secondary | ICD-10-CM

## 2023-09-12 DIAGNOSIS — I5022 Chronic systolic (congestive) heart failure: Secondary | ICD-10-CM

## 2023-09-12 DIAGNOSIS — I10 Essential (primary) hypertension: Secondary | ICD-10-CM

## 2023-09-12 DIAGNOSIS — Z5181 Encounter for therapeutic drug level monitoring: Secondary | ICD-10-CM | POA: Diagnosis not present

## 2023-09-12 DIAGNOSIS — N210 Calculus in bladder: Secondary | ICD-10-CM

## 2023-09-12 DIAGNOSIS — Z9359 Other cystostomy status: Secondary | ICD-10-CM | POA: Diagnosis not present

## 2023-09-12 DIAGNOSIS — G2581 Restless legs syndrome: Secondary | ICD-10-CM | POA: Diagnosis not present

## 2023-09-12 DIAGNOSIS — Z853 Personal history of malignant neoplasm of breast: Secondary | ICD-10-CM

## 2023-09-12 DIAGNOSIS — Z0000 Encounter for general adult medical examination without abnormal findings: Secondary | ICD-10-CM | POA: Diagnosis not present

## 2023-09-12 DIAGNOSIS — K5900 Constipation, unspecified: Secondary | ICD-10-CM

## 2023-09-12 DIAGNOSIS — M810 Age-related osteoporosis without current pathological fracture: Secondary | ICD-10-CM | POA: Diagnosis not present

## 2023-09-12 DIAGNOSIS — E782 Mixed hyperlipidemia: Secondary | ICD-10-CM

## 2023-09-13 ENCOUNTER — Encounter: Payer: Self-pay | Admitting: Family Medicine

## 2023-09-19 DIAGNOSIS — N312 Flaccid neuropathic bladder, not elsewhere classified: Secondary | ICD-10-CM | POA: Diagnosis not present

## 2023-09-19 NOTE — Progress Notes (Signed)
 Patient advised.

## 2023-09-25 DIAGNOSIS — G2581 Restless legs syndrome: Secondary | ICD-10-CM | POA: Diagnosis not present

## 2023-09-25 DIAGNOSIS — I10 Essential (primary) hypertension: Secondary | ICD-10-CM | POA: Diagnosis not present

## 2023-09-25 DIAGNOSIS — Z5181 Encounter for therapeutic drug level monitoring: Secondary | ICD-10-CM | POA: Diagnosis not present

## 2023-09-25 DIAGNOSIS — E782 Mixed hyperlipidemia: Secondary | ICD-10-CM | POA: Diagnosis not present

## 2023-09-25 DIAGNOSIS — K5900 Constipation, unspecified: Secondary | ICD-10-CM | POA: Diagnosis not present

## 2023-09-25 DIAGNOSIS — Z853 Personal history of malignant neoplasm of breast: Secondary | ICD-10-CM | POA: Diagnosis not present

## 2023-09-25 LAB — LIPID PANEL

## 2023-09-26 ENCOUNTER — Other Ambulatory Visit: Payer: Self-pay

## 2023-09-26 ENCOUNTER — Ambulatory Visit: Payer: Self-pay | Admitting: Family Medicine

## 2023-09-26 DIAGNOSIS — E782 Mixed hyperlipidemia: Secondary | ICD-10-CM

## 2023-09-26 LAB — COMPREHENSIVE METABOLIC PANEL WITH GFR
ALT: 14 IU/L (ref 0–32)
AST: 15 IU/L (ref 0–40)
Albumin: 4.1 g/dL (ref 3.6–4.6)
Alkaline Phosphatase: 167 IU/L — AB (ref 44–121)
BUN/Creatinine Ratio: 25 (ref 12–28)
BUN: 24 mg/dL (ref 10–36)
Bilirubin Total: 0.5 mg/dL (ref 0.0–1.2)
CO2: 24 mmol/L (ref 20–29)
Calcium: 10 mg/dL (ref 8.7–10.3)
Chloride: 96 mmol/L (ref 96–106)
Creatinine, Ser: 0.95 mg/dL (ref 0.57–1.00)
Globulin, Total: 2.6 g/dL (ref 1.5–4.5)
Glucose: 97 mg/dL (ref 70–99)
Potassium: 4.8 mmol/L (ref 3.5–5.2)
Sodium: 136 mmol/L (ref 134–144)
Total Protein: 6.7 g/dL (ref 6.0–8.5)
eGFR: 56 mL/min/1.73 — AB (ref >59)

## 2023-09-26 LAB — CBC WITH DIFFERENTIAL/PLATELET
Basophils Absolute: 0.1 x10E3/uL (ref 0.0–0.2)
Basos: 1 %
EOS (ABSOLUTE): 0.3 x10E3/uL (ref 0.0–0.4)
Eos: 6 %
Hematocrit: 38.8 % (ref 34.0–46.6)
Hemoglobin: 12.2 g/dL (ref 11.1–15.9)
Immature Grans (Abs): 0 x10E3/uL (ref 0.0–0.1)
Immature Granulocytes: 0 %
Lymphocytes Absolute: 0.9 x10E3/uL (ref 0.7–3.1)
Lymphs: 15 %
MCH: 28.7 pg (ref 26.6–33.0)
MCHC: 31.4 g/dL — ABNORMAL LOW (ref 31.5–35.7)
MCV: 91 fL (ref 79–97)
Monocytes Absolute: 0.6 x10E3/uL (ref 0.1–0.9)
Monocytes: 9 %
Neutrophils Absolute: 4.2 x10E3/uL (ref 1.4–7.0)
Neutrophils: 69 %
Platelets: 260 x10E3/uL (ref 150–450)
RBC: 4.25 x10E6/uL (ref 3.77–5.28)
RDW: 12.8 % (ref 11.7–15.4)
WBC: 6.1 x10E3/uL (ref 3.4–10.8)

## 2023-09-26 LAB — LIPID PANEL
Cholesterol, Total: 200 mg/dL — AB (ref 100–199)
HDL: 66 mg/dL (ref >39)
LDL CALC COMMENT:: 3 ratio (ref 0.0–4.4)
LDL Chol Calc (NIH): 113 mg/dL — AB (ref 0–99)
Triglycerides: 117 mg/dL (ref 0–149)
VLDL Cholesterol Cal: 21 mg/dL (ref 5–40)

## 2023-09-26 LAB — TSH: TSH: 2.92 u[IU]/mL (ref 0.450–4.500)

## 2023-09-26 LAB — FERRITIN: Ferritin: 243 ng/mL — AB (ref 15–150)

## 2023-09-26 LAB — MAGNESIUM: Magnesium: 2.2 mg/dL (ref 1.6–2.3)

## 2023-09-26 MED ORDER — ATORVASTATIN CALCIUM 40 MG PO TABS: 40.0000 mg | ORAL_TABLET | Freq: Every day | ORAL | 3 refills | Status: DC

## 2023-09-26 MED ORDER — GABAPENTIN 100 MG PO CAPS: ORAL_CAPSULE | ORAL | 5 refills | Status: DC

## 2023-09-26 NOTE — Patient Outreach (Signed)
 Complex Care Management   Visit Note  09/26/2023  Name:  Mary [name] MRN: [id number] DOB: 1926/04/17  Situation: Referral received for Complex Care Management related to Essential Hypertension, benign, Mixed Hyperlipidemia, presence of suprapubic catheter, Constipation, Osteoporosis, Osteoarthritis of multiple joints, bilateral hearing loss. I obtained verbal consent from Caregiver.  Visit completed with daughters Carley and Mont on the phone.  Background:   Past Medical History:  Diagnosis Date   Alopecia    Ashkenazi Jewish ancestry    Cancer [facility])    Dyspnea    External hemorrhoid    Foley catheter in place    Heart palpitations no cardiologist--  monitored by pcp   per pt has been on verapamil  and lanoxin  for years and no palpitations for a very long time   History of acute bronchitis    dx 12-21-2014  finished ZPAK   History of adenomatous polyp of colon    2003   History of basal cell carcinoma excision    2006  left nasolabial fold   History of benign colon tumor    2007  --  HIGH GRADE HYPERPLASTIC ,  S/P  LEFT HEMICOLECTOMY   History of breast cancer ONCOLOGIST-  DR LAYLA--  antiestogen therapy with femera completed 12/  2012--  no recurrence   dx 10/  2007  right breast Invasive DCIS, grade 2, Stage 2A, pT2  pN0 pMX,  (ER 100% /PR 3% +),  HER +3) with 1 metastatic axill node---  s/p  right mastectomy    History of kidney stones    Hypertension    Hypotonic bladder UROLOGIST-  DR CHALES   Mixed hyperlipidemia    OA (osteoarthritis)    hip   Osteoporosis    DEXA 01/2009; T-3.2 L fem neck, -2.4 L radius   Urinary retention    02-13-2015    Assessment: Patient Reported Symptoms:  Cognitive Cognitive Status: Alert and oriented to person, place, and time Cognitive/Intellectual Conditions Management [RPT]: None reported or documented in medical history or problem list   Health Maintenance Behaviors: Annual physical exam, Hobbies Healing Pattern:  Unsure Health Facilitated by: Pain control, Rest  Neurological Neurological Review of Symptoms: No symptoms reported    HEENT HEENT Symptoms Reported: Change or loss of hearing HEENT Management Strategies: Routine screening HEENT Self-Management Outcome: 3 (uncertain)    Cardiovascular Cardiovascular Symptoms Reported: No symptoms reported Does patient have uncontrolled Hypertension?: No Cardiovascular Management Strategies: Medication therapy, Routine screening Cardiovascular Self-Management Outcome: 4 (good)  Respiratory Respiratory Symptoms Reported: Shortness of breath Respiratory Management Strategies: Routine screening Respiratory Self-Management Outcome: 4 (good)  Endocrine Endocrine Symptoms Reported: No symptoms reported Is patient diabetic?: No    Gastrointestinal Gastrointestinal Symptoms Reported: Constipation Gastrointestinal Management Strategies: Medication therapy, Diet modification, Fluid modification Gastrointestinal Self-Management Outcome: 3 (uncertain)    Genitourinary Genitourinary Symptoms Reported: Incontinence Additional Genitourinary Details: suprapubic catheter Genitourinary Management Strategies: Medical device (suprapubic catheter) Genitourinary Self-Management Outcome: 4 (good)  Integumentary Integumentary Symptoms Reported: Itching Skin Management Strategies: Routine screening, Medication therapy Skin Self-Management Outcome: 4 (good)  Musculoskeletal Musculoskelatal Symptoms Reviewed: Joint pain, Muscle pain, Limited mobility, Difficulty walking, Unsteady gait, Back pain Musculoskeletal Management Strategies: Medical device, Routine screening Musculoskeletal Self-Management Outcome: 3 (uncertain) Falls in the past year?: Yes Number of falls in past year: 1 or less Was there an injury with Fall?: No Fall Risk Category Calculator: 1 Patient Fall Risk Level: Low Fall Risk Patient at Risk for Falls Due to: Impaired balance/gait, Impaired mobility Fall  risk Follow up:  Falls evaluation completed, Education provided  Psychosocial Psychosocial Symptoms Reported: No symptoms reported   Major Change/Loss/Stressor/Fears (CP): Denies Quality of Family Relationships: helpful, involved, supportive Do you feel physically threatened by others?: No      09/26/2023   10:21 AM  Depression screen PHQ 2/9  Decreased Interest 0  Down, Depressed, Hopeless 1  PHQ - 2 Score 1    There were no vitals filed for this visit.  Medications Reviewed Today     Reviewed by Morgan Clayborne CROME, RN (Registered Nurse) on 09/26/23 at 1059  Med List Status: <None>   Medication Order Taking? Sig Documenting Provider Last Dose Status Informant  Acetaminophen  500 MG capsule [id number] Yes Take 1,000 mg by mouth 2 (two) times daily after a meal. [provider]  Active Family Member           Med Note BEVERLEE, VERONICA F   Wed Sep 12, 2023  1:40 PM) As needed  atorvastatin  (LIPITOR) 40 MG tablet [id number] Yes Take 1 tablet (40 mg total) by mouth daily. Randol Dawes, MD  Active   bisacodyl  (DULCOLAX) 5 MG EC tablet [id number] Yes Take 5-15 mg by mouth See admin instructions. Take 5 mg by mouth once a day and 15 mg once daily for constipation [provider]  Active Family Member           Med Note BEVERLEE, LUCIENNE FALCON   Wed Sep 12, 2023  1:37 PM) As needed  Cholecalciferol  (VITAMIN D3) 50 MCG (2000 UT) TABS [id number] Yes Take 2,000 Units by mouth daily. Magrinat, Sandria BROCKS, MD  Active Family Member  denosumab  (PROLIA ) 60 MG/ML SOLN injection [id number]  Inject 60 mg into the skin every 6 (six) months. Administer in upper arm, thigh, or abdomen  Patient not taking: Reported on 09/12/2023   [provider]  Active Family Member           Med Note JERALYN DUNCANS A   Thu Aug 25, 2021  1:29 PM)    diazepam  (VALIUM ) 2 MG tablet [id number] Yes TAKE 1 TABLET(2 MG) BY MOUTH EVERY 8 HOURS AS NEEDED FOR MUSCLE SPASMS Randol Dawes, MD  Active            Med Note BEVERLEE,  DELAWARE F   Wed Sep 12, 2023  1:39 PM) As needed  fluticasone (CUTIVATE) 0.005 % ointment [id number]  Apply 1 Application topically daily as needed (irritation- affected area).  Patient not taking: Reported on 09/26/2023   [provider]  Active Family Member           Med Note DEDE DOLORES JINNY Stevan Feb 14, 2023  3:01 PM) Pt takes as needed.   furosemide  (LASIX ) 20 MG tablet [id number] Yes Take 1 tablet (20 mg total) by mouth 3 (three) times a week. Patsy Lenis, MD  Active   gabapentin  (NEURONTIN ) 100 MG capsule [id number] Yes TAKE 2-3 CAPSULES BY MOUTH AT BEDTIME Randol Dawes, MD  Active   GEMTESA 75 MG TABS [id number] Yes Take 75 mg by mouth every other day. [provider]  Active   LINZESS 72 MCG capsule [id number]  Take 72 mcg by mouth daily before breakfast. [provider]  Active            Med Note (   Wed Sep 12, 2023  2:12 PM) Uses prn constipation (from Dr. Bonner)  LITHOSTAT 250 MG TABS [id number]  Take 250 mg by mouth See admin instructions. Take 250 mg by  mouth once a day- cut into TWO halves and taken together [provider]  Active Family Member           Med Note MARISA NATHANEL SAILOR   Sat Feb 03, 2023  9:39 PM)    loratadine  (CLARITIN ) 10 MG tablet [id number] Yes Take 10 mg by mouth daily. [provider]  Active Family Member           Med Note MARISA NATHANEL SAILOR   Sat Feb 03, 2023  9:11 PM)    metoprolol  succinate (TOPROL -XL) 50 MG 24 hr tablet [id number] Yes Take 1 tablet (50 mg total) by mouth daily. Take with or immediately following a meal. Court Dorn PARAS, MD  Active   Multiple Vitamins-Minerals (PRESERVISION AREDS 2) CAPS [id number] Yes Take 1 capsule by mouth 2 (two) times daily.  Patient taking differently: Take 1 capsule by mouth 2 (two) times daily. Patient takes this medication sometimes   [provider]  Active Family Member           Med Note MARISA NATHANEL SAILOR   Sat Feb 03, 2023  9:21 PM)    ondansetron  (ZOFRAN  ODT) 4 MG  disintegrating tablet [id number] Yes Take 1 tablet (4 mg total) by mouth every 8 (eight) hours as needed for nausea or vomiting. Randol Dawes, MD  Active Family Member  Polyvinyl Alcohol -Povidone (REFRESH OP) [id number] Yes Place 1 drop into both eyes 2 (two) times daily. [provider]  Active Family Member  spironolactone  (ALDACTONE ) 25 MG tablet [id number] Yes Take 0.5 tablets (12.5 mg total) by mouth daily. Court Dorn PARAS, MD  Active   traMADol  (ULTRAM ) 50 MG tablet [id number] Yes Take 25-50 mg by mouth every 8 (eight) hours as needed (for pain). [provider]  Active Family Member           Med Note BEVERLEE, LUCIENNE JULIANNA [place] Sep 12, 2023  1:39 PM) As needed  triamcinolone  cream (KENALOG ) 0.1 % [id number] Yes APPLY TOPICALLY TO THE AFFECTED AREA TWICE DAILY  Patient taking differently: 1 Application as needed.   Randol Dawes, MD  Active             Recommendation:   Continue Current Plan of Care  Follow Up Plan:   Closing From:  Complex Care Management  Clayborne Ly RN BSN CCM [facility]  [facility], [facility] Nurse Care Coordinator  Direct Dial: [phone] Website: [email or web address]

## 2023-09-26 NOTE — Patient Instructions (Signed)
 Visit Information  Thank you for taking time to visit with me today.   Following is a copy of your care plan:   Goals Addressed             This Visit's Progress    COMPLETED: To better manage CHF       Care Coordination Interventions:  Evaluation of current treatment plan related to CHF,  self-management and patient's adherence to plan as established by provider Assessed need for readable accurate scales in home Provided education about placing scale on hard, flat surface Advised patient to weigh each morning after emptying bladder Discussed importance of daily weight and advised patient to weigh and record daily Instructed daughters to keep patient's doctor informed of new or worsening symptoms         COMPLETED: To undergo cystoscopy and suprapubic catheter placement without complications       Care Coordination Interventions: Evaluation of current treatment plan related to urinary retention with presence of suprapubic catheter and patient's adherence to plan as established by provider Reviewed medications with daughters Carley and Susie, completed medication reconciliation  Reviewed and discussed the presence of patient's suprapubic catheter with no reports of catheter complications and or recent symptoms suggestive of UTI Discussed patient has private paid caregiver assistance who help with bathing and perineal hygiene, daughters get a daily update with patient daily routine and or symptoms or concern Instructed daughters to keep patient's PCP well informed of new symptoms or concerns Discussed nurse case closure due to patient is staying healthy and managing her chronic health conditions         Please go to [facility] [address] [place] [phone]) if you are experiencing a Mental Health or Behavioral Health Crisis or need someone to talk to.  Patient verbalizes understanding of instructions and care plan provided today and  agrees to view in MyChart. Active MyChart status and patient understanding of how to access instructions and care plan via MyChart confirmed with patient.     Clayborne Ly RN BSN CCM [place]  [facility], [facility] Nurse Care Coordinator  Direct Dial: [phone] Website: [email or web address]

## 2023-10-08 ENCOUNTER — Telehealth: Payer: Self-pay | Admitting: *Deleted

## 2023-10-08 NOTE — Telephone Encounter (Signed)
 Spoke with patient and went over all recommendations.

## 2023-10-08 NOTE — Telephone Encounter (Signed)
 Simethicone (gas-x) is helpful for trapped gas. If she is just passing a lot of gas, then it won't do much. When that is the case, we look at the diet to see if any foods might be triggering the gas production (ie beans, dairy, raw vegetables, etc).  If constipated, it might seem worse, so work on drinking plenty of water , and getting a high fiber diet (or a fiber supplement). Use the Linzess, if needed, for constipation.  If having worsening abdominal pain, fever, blood in the stool ,then should be evaluated.

## 2023-10-08 NOTE — Telephone Encounter (Signed)
 Copied from CRM [id number]. Topic: Clinical - Medication Question >> Oct 08, 2023  2:18 PM Delon HERO wrote: Reason for CRM: Patient is calling to ask what medication is good for gas? Patient report gas for 3 nights. And has been using Gas X with no relieve. Please advise   Please advise.

## 2023-10-11 ENCOUNTER — Encounter (HOSPITAL_COMMUNITY): Payer: Self-pay

## 2023-10-11 ENCOUNTER — Ambulatory Visit: Payer: Self-pay | Admitting: *Deleted

## 2023-10-11 ENCOUNTER — Emergency Department (HOSPITAL_COMMUNITY)

## 2023-10-11 ENCOUNTER — Ambulatory Visit: Payer: Self-pay

## 2023-10-11 ENCOUNTER — Inpatient Hospital Stay (HOSPITAL_COMMUNITY)
Admission: EM | Admit: 2023-10-11 | Discharge: 2023-10-13 | DRG: 436 | Disposition: A | Discharge status: Hospice | Attending: Internal Medicine | Admitting: Internal Medicine

## 2023-10-11 DIAGNOSIS — W19XXXA Unspecified fall, initial encounter: Secondary | ICD-10-CM | POA: Diagnosis not present

## 2023-10-11 DIAGNOSIS — Z881 Allergy status to other antibiotic agents status: Secondary | ICD-10-CM

## 2023-10-11 DIAGNOSIS — R42 Dizziness and giddiness: Secondary | ICD-10-CM | POA: Diagnosis not present

## 2023-10-11 DIAGNOSIS — R1084 Generalized abdominal pain: Secondary | ICD-10-CM | POA: Diagnosis not present

## 2023-10-11 DIAGNOSIS — Z882 Allergy status to sulfonamides status: Secondary | ICD-10-CM

## 2023-10-11 DIAGNOSIS — N312 Flaccid neuropathic bladder, not elsewhere classified: Secondary | ICD-10-CM | POA: Diagnosis not present

## 2023-10-11 DIAGNOSIS — Z85828 Personal history of other malignant neoplasm of skin: Secondary | ICD-10-CM | POA: Diagnosis not present

## 2023-10-11 DIAGNOSIS — Z7401 Bed confinement status: Secondary | ICD-10-CM | POA: Diagnosis not present

## 2023-10-11 DIAGNOSIS — R55 Syncope and collapse: Principal | ICD-10-CM | POA: Diagnosis present

## 2023-10-11 DIAGNOSIS — Z9359 Other cystostomy status: Secondary | ICD-10-CM

## 2023-10-11 DIAGNOSIS — G2581 Restless legs syndrome: Secondary | ICD-10-CM | POA: Diagnosis present

## 2023-10-11 DIAGNOSIS — Z66 Do not resuscitate: Secondary | ICD-10-CM | POA: Diagnosis not present

## 2023-10-11 DIAGNOSIS — Z853 Personal history of malignant neoplasm of breast: Secondary | ICD-10-CM | POA: Diagnosis not present

## 2023-10-11 DIAGNOSIS — Z803 Family history of malignant neoplasm of breast: Secondary | ICD-10-CM

## 2023-10-11 DIAGNOSIS — R8290 Unspecified abnormal findings in urine: Secondary | ICD-10-CM | POA: Insufficient documentation

## 2023-10-11 DIAGNOSIS — E871 Hypo-osmolality and hyponatremia: Secondary | ICD-10-CM | POA: Diagnosis present

## 2023-10-11 DIAGNOSIS — R109 Unspecified abdominal pain: Secondary | ICD-10-CM | POA: Diagnosis not present

## 2023-10-11 DIAGNOSIS — E782 Mixed hyperlipidemia: Secondary | ICD-10-CM | POA: Diagnosis not present

## 2023-10-11 DIAGNOSIS — Z8249 Family history of ischemic heart disease and other diseases of the circulatory system: Secondary | ICD-10-CM

## 2023-10-11 DIAGNOSIS — Z96651 Presence of right artificial knee joint: Secondary | ICD-10-CM | POA: Diagnosis not present

## 2023-10-11 DIAGNOSIS — K8689 Other specified diseases of pancreas: Secondary | ICD-10-CM | POA: Diagnosis not present

## 2023-10-11 DIAGNOSIS — I480 Paroxysmal atrial fibrillation: Secondary | ICD-10-CM | POA: Diagnosis not present

## 2023-10-11 DIAGNOSIS — Z558 Other problems related to education and literacy: Secondary | ICD-10-CM

## 2023-10-11 DIAGNOSIS — I4510 Unspecified right bundle-branch block: Secondary | ICD-10-CM | POA: Diagnosis present

## 2023-10-11 DIAGNOSIS — R54 Age-related physical debility: Secondary | ICD-10-CM | POA: Diagnosis present

## 2023-10-11 DIAGNOSIS — Z79899 Other long term (current) drug therapy: Secondary | ICD-10-CM

## 2023-10-11 DIAGNOSIS — C252 Malignant neoplasm of tail of pancreas: Secondary | ICD-10-CM | POA: Diagnosis not present

## 2023-10-11 DIAGNOSIS — K869 Disease of pancreas, unspecified: Secondary | ICD-10-CM | POA: Diagnosis not present

## 2023-10-11 DIAGNOSIS — I5022 Chronic systolic (congestive) heart failure: Secondary | ICD-10-CM | POA: Diagnosis present

## 2023-10-11 DIAGNOSIS — C787 Secondary malignant neoplasm of liver and intrahepatic bile duct: Secondary | ICD-10-CM | POA: Diagnosis not present

## 2023-10-11 DIAGNOSIS — K5900 Constipation, unspecified: Secondary | ICD-10-CM | POA: Diagnosis present

## 2023-10-11 DIAGNOSIS — Z888 Allergy status to other drugs, medicaments and biological substances status: Secondary | ICD-10-CM | POA: Diagnosis not present

## 2023-10-11 DIAGNOSIS — Z515 Encounter for palliative care: Secondary | ICD-10-CM

## 2023-10-11 DIAGNOSIS — E860 Dehydration: Secondary | ICD-10-CM | POA: Diagnosis present

## 2023-10-11 DIAGNOSIS — Z7189 Other specified counseling: Secondary | ICD-10-CM | POA: Diagnosis not present

## 2023-10-11 DIAGNOSIS — Z83438 Family history of other disorder of lipoprotein metabolism and other lipidemia: Secondary | ICD-10-CM

## 2023-10-11 DIAGNOSIS — C259 Malignant neoplasm of pancreas, unspecified: Secondary | ICD-10-CM | POA: Diagnosis not present

## 2023-10-11 DIAGNOSIS — Z823 Family history of stroke: Secondary | ICD-10-CM

## 2023-10-11 DIAGNOSIS — R000 Tachycardia, unspecified: Secondary | ICD-10-CM | POA: Diagnosis not present

## 2023-10-11 DIAGNOSIS — I110 Hypertensive heart disease with heart failure: Secondary | ICD-10-CM | POA: Diagnosis not present

## 2023-10-11 DIAGNOSIS — M810 Age-related osteoporosis without current pathological fracture: Secondary | ICD-10-CM | POA: Diagnosis present

## 2023-10-11 DIAGNOSIS — Z833 Family history of diabetes mellitus: Secondary | ICD-10-CM

## 2023-10-11 DIAGNOSIS — R4182 Altered mental status, unspecified: Secondary | ICD-10-CM | POA: Diagnosis not present

## 2023-10-11 DIAGNOSIS — I6782 Cerebral ischemia: Secondary | ICD-10-CM | POA: Diagnosis not present

## 2023-10-11 DIAGNOSIS — R140 Abdominal distension (gaseous): Secondary | ICD-10-CM | POA: Diagnosis not present

## 2023-10-11 DIAGNOSIS — G8929 Other chronic pain: Secondary | ICD-10-CM | POA: Diagnosis not present

## 2023-10-11 DIAGNOSIS — Z9011 Acquired absence of right breast and nipple: Secondary | ICD-10-CM

## 2023-10-11 DIAGNOSIS — N281 Cyst of kidney, acquired: Secondary | ICD-10-CM | POA: Diagnosis not present

## 2023-10-11 DIAGNOSIS — Z800 Family history of malignant neoplasm of digestive organs: Secondary | ICD-10-CM

## 2023-10-11 DIAGNOSIS — I10 Essential (primary) hypertension: Secondary | ICD-10-CM | POA: Diagnosis present

## 2023-10-11 DIAGNOSIS — Z860101 Personal history of adenomatous and serrated colon polyps: Secondary | ICD-10-CM

## 2023-10-11 DIAGNOSIS — Z880 Allergy status to penicillin: Secondary | ICD-10-CM | POA: Diagnosis not present

## 2023-10-11 LAB — COMPREHENSIVE METABOLIC PANEL WITH GFR
ALT: 15 U/L (ref 0–44)
AST: 19 U/L (ref 15–41)
Albumin: 3.6 g/dL (ref 3.5–5.0)
Alkaline Phosphatase: 139 U/L — ABNORMAL HIGH (ref 38–126)
Anion gap: 10 (ref 5–15)
BUN: 19 mg/dL (ref 8–23)
CO2: 29 mmol/L (ref 22–32)
Calcium: 9.5 mg/dL (ref 8.9–10.3)
Chloride: 91 mmol/L — ABNORMAL LOW (ref 98–111)
Creatinine, Ser: 0.77 mg/dL (ref 0.44–1.00)
GFR, Estimated: >60 mL/min (ref >60)
Glucose, Bld: 126 mg/dL — ABNORMAL HIGH (ref 70–99)
Potassium: 3.9 mmol/L (ref 3.5–5.1)
Sodium: 130 mmol/L — ABNORMAL LOW (ref 135–145)
Total Bilirubin: 0.8 mg/dL (ref 0.0–1.2)
Total Protein: 7.5 g/dL (ref 6.5–8.1)

## 2023-10-11 LAB — CBC WITH DIFFERENTIAL/PLATELET
Abs Immature Granulocytes: 0.02 K/uL (ref 0.00–0.07)
Basophils Absolute: 0.1 K/uL (ref 0.0–0.1)
Basophils Relative: 1 %
Eosinophils Absolute: 0.3 K/uL (ref 0.0–0.5)
Eosinophils Relative: 5 %
HCT: 39 % (ref 36.0–46.0)
Hemoglobin: 12.4 g/dL (ref 12.0–15.0)
Immature Granulocytes: 0 %
Lymphocytes Relative: 11 %
Lymphs Abs: 0.7 K/uL (ref 0.7–4.0)
MCH: 28.2 pg (ref 26.0–34.0)
MCHC: 31.8 g/dL (ref 30.0–36.0)
MCV: 88.6 fL (ref 80.0–100.0)
Monocytes Absolute: 0.7 K/uL (ref 0.1–1.0)
Monocytes Relative: 11 %
Neutro Abs: 4.5 K/uL (ref 1.7–7.7)
Neutrophils Relative %: 72 %
Platelets: 304 K/uL (ref 150–400)
RBC: 4.4 MIL/uL (ref 3.87–5.11)
RDW: 13.4 % (ref 11.5–15.5)
WBC: 6.3 K/uL (ref 4.0–10.5)
nRBC: 0 % (ref 0.0–0.2)

## 2023-10-11 LAB — URINALYSIS, W/ REFLEX TO CULTURE (INFECTION SUSPECTED)
Bacteria, UA: NONE SEEN
Bilirubin Urine: NEGATIVE
Glucose, UA: NEGATIVE mg/dL
Ketones, ur: NEGATIVE mg/dL
Nitrite: NEGATIVE
Protein, ur: 30 mg/dL — AB
Specific Gravity, Urine: 1.009 (ref 1.005–1.030)
WBC, UA: >50 WBC/hpf (ref 0–5)
pH: 6 (ref 5.0–8.0)

## 2023-10-11 LAB — LIPASE, BLOOD: Lipase: 35 U/L (ref 11–51)

## 2023-10-11 LAB — TROPONIN I (HIGH SENSITIVITY): Troponin I (High Sensitivity): 15 ng/L (ref <18)

## 2023-10-11 MED ORDER — IOHEXOL 300 MG/ML  SOLN
80.0000 mL | Freq: Once | INTRAMUSCULAR | Status: AC | PRN
  Administered 2023-10-11: 80 mL via INTRAVENOUS

## 2023-10-11 MED ORDER — SODIUM CHLORIDE 0.9 % IV BOLUS
500.0000 mL | Freq: Once | INTRAVENOUS | Status: AC
  Administered 2023-10-11: 500 mL via INTRAVENOUS

## 2023-10-11 NOTE — Telephone Encounter (Signed)
 FYI Only or Action Required?: FYI only for provider.  Patient was last seen in primary care on 09/12/2023 by Randol Dawes, MD.  Called Nurse Triage reporting Abdominal Pain and Advice Only.   Triage Disposition: Duplicate Contact Calls  Patient/caregiver understands and will follow disposition?: Unsure      Copied from CRM #[id number]. Topic: Clinical - Medical Advice >> Oct 11, 2023  4:48 PM Mary [name] wrote: Reason for CRM: Pt daughter would like to speak to someone about her mother's well being, apparently pt has been unwell on and off for the last couple of days, she has seen a doctor previously about this but still has some concerns. (Please refer to recent encounters). Reason for Disposition  Caller has already spoken with another triager and has no further questions.  Answer Assessment - Initial Assessment Questions Called pt and she deferred to speaking with daughter Mary [name]. Mary [name] has previously called today and was instructed to go to the ED. Triager asked if anything has changed since she spoke to last triager and stated sx come and go. Upon further investigation, PCP endorses pt going to ED. Triager relayed information and stressed in going ASAP. Caregiver verbalized understanding and to call back/911 with worsening symptoms.  Protocols used: No Contact or Duplicate Contact Call-A-AH

## 2023-10-11 NOTE — Telephone Encounter (Signed)
 Called patient, left message to follow up and see if she went to ER. I do not see anything in system indicating arrival at any of the [facility] facilities.

## 2023-10-11 NOTE — Telephone Encounter (Signed)
 Copied from CRM #[id number]. Topic: Clinical - Red Word Triage >> Oct 11, 2023 11:26 AM Antwanette L wrote: Red Word that prompted transfer to Nurse Triage: abdominal pain Reason for Disposition  Health information question, no triage required and triager able to answer question  Answer Assessment - Initial Assessment Questions 1. REASON FOR CALL: What is the main reason for your call? or How can I best help you?     Avelina, daughter calling in.      Pt has been triaged earlier today and advised to go to the ED.  Avelina wanting to know which ED she should go to between the [facility] one at the [facility] location or [facility].  I just got in from [place]  and I'm not familiar with the area.   One of my family members said she would be seen quicker at the [facility] location however if she needed to be admitted she would have to be transported to a hospital.   I let Avelina know that was correct and that either ED would be fine.   They could both do a full work up on her mother.     Which ED should she go to?   I'm not from here.   She is having gas pains and abd pain.   Her stomach is hurting so she was advised to go to the ED by the nurse that my mother's aid talked with earlier today.    2. SYMPTOMS : Do you have any symptoms?      See triage questions  3. OTHER QUESTIONS: Do you have any other questions?     Should I call EMS?   She is weak.   I advised her to call 911 if her mother was weak so they could transport her safely on a stretcher.   Avelina is going to call 911 and thanked me very much for my help.  Protocols used: Information Only Call - No Triage-A-AH  FYI Only or Action Required?: FYI only for provider.  Patient was last seen in primary care on 09/12/2023 by Randol Dawes, MD.  Called Nurse Triage reporting Advice Only.  Symptoms began today.  Interventions attempted: Other: Daughter Avelina called back in asking which ED would be the better one to go  to.   The one at [facility] or [facility].  A family member said she would be seen quicker at the [facility] location.  Symptoms are: unchanged. Due to her mother being weak she is going to call 911 to transport her to the ED.  Triage Disposition: Information or Advice Only Call  Patient/caregiver understands and will follow disposition?: Yes

## 2023-10-11 NOTE — ED Triage Notes (Signed)
 Pt BIB ems for a fall today, states the caregiver was near here when she became dizzy and eased her to the ground. Denies hitting her head, no pain anywhere, no blood thinners, no syncope. A&O x4. Hx of HTN. VS stable.

## 2023-10-11 NOTE — ED Provider Notes (Signed)
 [place] EMERGENCY DEPARTMENT AT [facility] Provider Note   CSN: [id number] Arrival date & time: 10/11/23  2107     Patient presents with: Fall and Dizziness   Caridad Hollenberg is a 89 y.o. female history of restless leg syndrome, hyperlipidemia, urinary retention with suprapubic catheter here presenting with abdominal distention and dizziness.  Patient lives at home and has a 24-hour aide.  Patient has been having some abdominal distention for several days.  Daughter called the doctor's office who requested that patient be evaluated and get a CT abdomen pelvis.  The daughter flew in from [place]  and made her dinner.  Patient came back to her bed and felt lightheaded and dizzy and almost passed out.  The aide was there and was able to catch her.  Patient denies any chest pain or shortness of breath.  Patient has a suprapubic catheter that was changed 2 weeks ago.   The history is provided by the patient.       Prior to Admission medications   Medication Sig Start Date End Date Taking? Authorizing Provider  Acetaminophen  500 MG capsule Take 1,000 mg by mouth 2 (two) times daily after a meal.    [provider]  atorvastatin  (LIPITOR) 40 MG tablet Take 1 tablet (40 mg total) by mouth daily. 09/26/23   Randol Dawes, MD  bisacodyl  (DULCOLAX) 5 MG EC tablet Take 5-15 mg by mouth See admin instructions. Take 5 mg by mouth once a day and 15 mg once daily for constipation    [provider]  Cholecalciferol  (VITAMIN D3) 50 MCG (2000 UT) TABS Take 2,000 Units by mouth daily. 08/03/20   Magrinat, Sandria BROCKS, MD  denosumab  (PROLIA ) 60 MG/ML SOLN injection Inject 60 mg into the skin every 6 (six) months. Administer in upper arm, thigh, or abdomen Patient not taking: Reported on 09/12/2023 10/24/12   [provider]  diazepam  (VALIUM ) 2 MG tablet TAKE 1 TABLET(2 MG) BY MOUTH EVERY 8 HOURS AS NEEDED FOR MUSCLE SPASMS 05/15/23   Randol Dawes, MD  fluticasone (CUTIVATE) 0.005 %  ointment Apply 1 Application topically daily as needed (irritation- affected area). Patient not taking: Reported on 09/26/2023    [provider]  furosemide  (LASIX ) 20 MG tablet Take 1 tablet (20 mg total) by mouth 3 (three) times a week. 02/09/23   Patsy Lenis, MD  gabapentin  (NEURONTIN ) 100 MG capsule TAKE 2-3 CAPSULES BY MOUTH AT BEDTIME 09/26/23   Randol Dawes, MD  GEMTESA 75 MG TABS Take 75 mg by mouth every other day. 09/06/23   [provider]  LINZESS 72 MCG capsule Take 72 mcg by mouth daily before breakfast. 05/21/23   [provider]  LITHOSTAT 250 MG TABS Take 250 mg by mouth See admin instructions. Take 250 mg by mouth once a day- cut into TWO halves and taken together 01/26/23   [provider]  loratadine  (CLARITIN ) 10 MG tablet Take 10 mg by mouth daily.    [provider]  metoprolol  succinate (TOPROL -XL) 50 MG 24 hr tablet Take 1 tablet (50 mg total) by mouth daily. Take with or immediately following a meal. 06/29/23   Court Dorn PARAS, MD  Multiple Vitamins-Minerals (PRESERVISION AREDS 2) CAPS Take 1 capsule by mouth 2 (two) times daily. Patient taking differently: Take 1 capsule by mouth 2 (two) times daily. Patient takes this medication sometimes    [provider]  ondansetron  (ZOFRAN  ODT) 4 MG disintegrating tablet Take 1 tablet (4 mg total) by  mouth every 8 (eight) hours as needed for nausea or vomiting. 02/01/23   Randol Dawes, MD  Polyvinyl Alcohol -Povidone (REFRESH OP) Place 1 drop into both eyes 2 (two) times daily.    [provider]  spironolactone  (ALDACTONE ) 25 MG tablet Take 0.5 tablets (12.5 mg total) by mouth daily. 05/15/23   Court Dorn PARAS, MD  traMADol  (ULTRAM ) 50 MG tablet Take 25-50 mg by mouth every 8 (eight) hours as needed (for pain).    [provider]  triamcinolone  cream (KENALOG ) 0.1 % APPLY TOPICALLY TO THE AFFECTED AREA TWICE DAILY Patient taking differently: 1 Application as needed.  08/23/23   Randol Dawes, MD    Allergies: Gabapentin , Arimidex [anastrozole], Avelox [moxifloxacin hcl in nacl], Azithromycin , Bactrim, Biaxin [clarithromycin], Nitrofurantoin, Penicillins, Sulfa antibiotics, and Sulfur    Review of Systems  Gastrointestinal:  Positive for abdominal distention and abdominal pain.  All other systems reviewed and are negative.   Updated Vital Signs BP (!) 146/86   Pulse 91   Temp 98.3 F (36.8 C) (Oral)   Resp (!) 24   Ht 5' 3 (1.6 m)   Wt 52.2 kg   LMP  (LMP Unknown)   SpO2 97%   BMI 20.37 kg/m   Physical Exam Vitals and nursing note reviewed.  Constitutional:      Comments: Chronically ill-appearing  HENT:     Head: Normocephalic.     Nose: Nose normal.     Mouth/Throat:     Mouth: Mucous membranes are dry.  Eyes:     Extraocular Movements: Extraocular movements intact.     Pupils: Pupils are equal, round, and reactive to light.  Cardiovascular:     Rate and Rhythm: Normal rate and regular rhythm.     Pulses: Normal pulses.     Heart sounds: Normal heart sounds.  Pulmonary:     Effort: Pulmonary effort is normal.     Breath sounds: Normal breath sounds.  Abdominal:     General: Abdomen is flat.     Palpations: Abdomen is soft.     Comments: Slightly distended.  Patient has suprapubic catheter  Musculoskeletal:        General: Normal range of motion.     Cervical back: Normal range of motion.  Skin:    General: Skin is warm.     Capillary Refill: Capillary refill takes less than 2 seconds.  Neurological:     General: No focal deficit present.     Mental Status: She is alert and oriented to person, place, and time.  Psychiatric:        Mood and Affect: Mood normal.        Behavior: Behavior normal.     (all labs ordered are listed, but only abnormal results are displayed) Labs Reviewed  CBC WITH DIFFERENTIAL/PLATELET  COMPREHENSIVE METABOLIC PANEL WITH GFR  URINALYSIS, W/ REFLEX TO CULTURE (INFECTION SUSPECTED)  LIPASE,  BLOOD    EKG: EKG Interpretation Date/Time:  Thursday October 11 2023 21:30:05 EDT Ventricular Rate:  84 PR Interval:    QRS Duration:  135 QT Interval:  385 QTC Calculation: 456 R Axis:   -52  Text Interpretation: Atrial flutter with predominant 3:1 AV block RBBB and LAFB ST elevation, consider lateral injury No significant change since last tracing Confirmed by Patt Alm DEL [id number]) on 10/11/2023 9:50:23 PM  Radiology: No results found.   Procedures   Medications Ordered in the ED  sodium chloride  0.9 % bolus 500 mL (has no administration in time  range)                                    Medical Decision Making Belvia Gotschall is a 89 y.o. female here presenting with abdominal distention and near syncope.  Patient has no chest pain or shortness of breath.  Consider ileus versus SBO versus colitis causing her abdominal distention.  In terms of her near syncope, consider vasovagal syncope versus ACS versus dehydration versus UTI.  Plan to get CBC and CMP and troponin and UA and CT head and CT abdomen pelvis.  Will hydrate patient and reassess  10:47 PM I reviewed patient's labs and CBC unremarkable.  CMP showed sodium of 130 which is likely from dehydration.  Patient's UA showed large leuks but no bacteria.  Patient has Foley catheter and has been well-maintained I think this is likely colonization so we will hold off on antibiotics and will send off urine culture.  CT head and abdomen pending.  11 PM CT head normal and CT abdomen pelvis showed pancreatic cancer.  Updated patient and her daughter.  Hospitalist to admit for near syncope and possible pancreatic cancer.  Problems Addressed: Abdominal distention: acute illness or injury Near syncope: acute illness or injury Pancreatic mass: acute illness or injury  Amount and/or Complexity of Data Reviewed Labs: ordered. Decision-making details documented in ED Course. Radiology: ordered and independent interpretation performed.  Decision-making details documented in ED Course. ECG/medicine tests: ordered and independent interpretation performed. Decision-making details documented in ED Course.  Risk Prescription drug management.     Final diagnoses:  None    ED Discharge Orders     None          Patt Alm Macho, MD 10/11/23 2312

## 2023-10-11 NOTE — Telephone Encounter (Addendum)
 FYI Only or Action Required?: FYI only for provider.  Patient was last seen in primary care on 09/12/2023 by Randol Dawes, MD.  Called Nurse Triage reporting Abdominal Pain.  Symptoms began several weeks ago.  Interventions attempted: Nothing.  Symptoms are: gradually worsening.  Triage Disposition: Call PCP Now  Patient/caregiver understands and will follow disposition?:   To ER   Copied from CRM #[id number]. Topic: Clinical - Red Word Triage >> Oct 11, 2023 11:01 AM Mary [name] wrote: Mary [name] that prompted transfer to Nurse Triage: Patient is having really bad stomach pain. States she has been gassy and takes gas x but still has the pain. Symptoms have been occurring for almost 2 weeks.

## 2023-10-12 ENCOUNTER — Observation Stay (HOSPITAL_COMMUNITY)

## 2023-10-12 ENCOUNTER — Other Ambulatory Visit: Payer: Self-pay

## 2023-10-12 DIAGNOSIS — Z881 Allergy status to other antibiotic agents status: Secondary | ICD-10-CM | POA: Diagnosis not present

## 2023-10-12 DIAGNOSIS — R1084 Generalized abdominal pain: Secondary | ICD-10-CM

## 2023-10-12 DIAGNOSIS — I480 Paroxysmal atrial fibrillation: Secondary | ICD-10-CM

## 2023-10-12 DIAGNOSIS — Z853 Personal history of malignant neoplasm of breast: Secondary | ICD-10-CM | POA: Diagnosis not present

## 2023-10-12 DIAGNOSIS — Z558 Other problems related to education and literacy: Secondary | ICD-10-CM

## 2023-10-12 DIAGNOSIS — M810 Age-related osteoporosis without current pathological fracture: Secondary | ICD-10-CM | POA: Diagnosis present

## 2023-10-12 DIAGNOSIS — R54 Age-related physical debility: Secondary | ICD-10-CM | POA: Diagnosis present

## 2023-10-12 DIAGNOSIS — K8689 Other specified diseases of pancreas: Secondary | ICD-10-CM

## 2023-10-12 DIAGNOSIS — C259 Malignant neoplasm of pancreas, unspecified: Secondary | ICD-10-CM

## 2023-10-12 DIAGNOSIS — R8290 Unspecified abnormal findings in urine: Secondary | ICD-10-CM | POA: Diagnosis not present

## 2023-10-12 DIAGNOSIS — Z880 Allergy status to penicillin: Secondary | ICD-10-CM | POA: Diagnosis not present

## 2023-10-12 DIAGNOSIS — G8929 Other chronic pain: Secondary | ICD-10-CM | POA: Diagnosis present

## 2023-10-12 DIAGNOSIS — Z882 Allergy status to sulfonamides status: Secondary | ICD-10-CM | POA: Diagnosis not present

## 2023-10-12 DIAGNOSIS — Z888 Allergy status to other drugs, medicaments and biological substances status: Secondary | ICD-10-CM | POA: Diagnosis not present

## 2023-10-12 DIAGNOSIS — Z515 Encounter for palliative care: Secondary | ICD-10-CM

## 2023-10-12 DIAGNOSIS — C252 Malignant neoplasm of tail of pancreas: Secondary | ICD-10-CM | POA: Diagnosis not present

## 2023-10-12 DIAGNOSIS — C787 Secondary malignant neoplasm of liver and intrahepatic bile duct: Secondary | ICD-10-CM

## 2023-10-12 DIAGNOSIS — G2581 Restless legs syndrome: Secondary | ICD-10-CM | POA: Insufficient documentation

## 2023-10-12 DIAGNOSIS — N312 Flaccid neuropathic bladder, not elsewhere classified: Secondary | ICD-10-CM | POA: Diagnosis present

## 2023-10-12 DIAGNOSIS — Z7189 Other specified counseling: Secondary | ICD-10-CM

## 2023-10-12 DIAGNOSIS — E782 Mixed hyperlipidemia: Secondary | ICD-10-CM | POA: Diagnosis present

## 2023-10-12 DIAGNOSIS — R55 Syncope and collapse: Secondary | ICD-10-CM | POA: Diagnosis not present

## 2023-10-12 DIAGNOSIS — I5022 Chronic systolic (congestive) heart failure: Secondary | ICD-10-CM | POA: Diagnosis present

## 2023-10-12 DIAGNOSIS — K5900 Constipation, unspecified: Secondary | ICD-10-CM | POA: Diagnosis present

## 2023-10-12 DIAGNOSIS — E871 Hypo-osmolality and hyponatremia: Secondary | ICD-10-CM | POA: Diagnosis present

## 2023-10-12 DIAGNOSIS — Z85828 Personal history of other malignant neoplasm of skin: Secondary | ICD-10-CM | POA: Diagnosis not present

## 2023-10-12 DIAGNOSIS — I110 Hypertensive heart disease with heart failure: Secondary | ICD-10-CM | POA: Diagnosis present

## 2023-10-12 DIAGNOSIS — Z66 Do not resuscitate: Secondary | ICD-10-CM | POA: Diagnosis present

## 2023-10-12 DIAGNOSIS — Z96651 Presence of right artificial knee joint: Secondary | ICD-10-CM | POA: Diagnosis present

## 2023-10-12 DIAGNOSIS — R140 Abdominal distension (gaseous): Secondary | ICD-10-CM | POA: Diagnosis present

## 2023-10-12 DIAGNOSIS — Z8249 Family history of ischemic heart disease and other diseases of the circulatory system: Secondary | ICD-10-CM | POA: Diagnosis not present

## 2023-10-12 DIAGNOSIS — E860 Dehydration: Secondary | ICD-10-CM | POA: Diagnosis present

## 2023-10-12 LAB — ECHOCARDIOGRAM COMPLETE
Area-P 1/2: 3.03 cm2
Calc EF: 49.4 %
Height: 63 in
S' Lateral: 3.1 cm
Single Plane A2C EF: 59.1 %
Single Plane A4C EF: 43.4 %
Weight: 1753.1 [oz_av]

## 2023-10-12 LAB — OSMOLALITY, URINE: Osmolality, Ur: 422 mosm/kg (ref 300–900)

## 2023-10-12 LAB — MAGNESIUM: Magnesium: 1.9 mg/dL (ref 1.7–2.4)

## 2023-10-12 LAB — TROPONIN I (HIGH SENSITIVITY): Troponin I (High Sensitivity): 16 ng/L (ref <18)

## 2023-10-12 MED ORDER — METOPROLOL SUCCINATE ER 50 MG PO TB24
50.0000 mg | ORAL_TABLET | Freq: Every day | ORAL | Status: DC
  Administered 2023-10-12: 50 mg via ORAL
  Filled 2023-10-12: qty 1

## 2023-10-12 MED ORDER — METOPROLOL TARTRATE 5 MG/5ML IV SOLN: 2.5000 mg | Freq: Once | INTRAVENOUS | Status: DC

## 2023-10-12 MED ORDER — GABAPENTIN 100 MG PO CAPS
200.0000 mg | ORAL_CAPSULE | Freq: Every day | ORAL | Status: DC
  Administered 2023-10-12: 200 mg via ORAL
  Filled 2023-10-12: qty 2

## 2023-10-12 MED ORDER — SODIUM CHLORIDE 0.9 % IV SOLN: INTRAVENOUS | Status: DC

## 2023-10-12 MED ORDER — HYDROXYZINE HCL 25 MG PO TABS
25.0000 mg | ORAL_TABLET | Freq: Three times a day (TID) | ORAL | Status: DC | PRN
  Administered 2023-10-12: 25 mg via ORAL
  Filled 2023-10-12: qty 1

## 2023-10-12 MED ORDER — ACETAMINOPHEN 650 MG RE SUPP: 650.0000 mg | Freq: Four times a day (QID) | RECTAL | Status: DC | PRN

## 2023-10-12 MED ORDER — HYDRALAZINE HCL 20 MG/ML IJ SOLN
10.0000 mg | Freq: Four times a day (QID) | INTRAMUSCULAR | Status: DC | PRN
  Administered 2023-10-12: 10 mg via INTRAVENOUS
  Filled 2023-10-12: qty 1

## 2023-10-12 MED ORDER — ACETAMINOPHEN 325 MG PO TABS
650.0000 mg | ORAL_TABLET | Freq: Four times a day (QID) | ORAL | Status: DC | PRN
  Administered 2023-10-12 – 2023-10-13 (×2): 650 mg via ORAL
  Filled 2023-10-12 (×3): qty 2

## 2023-10-12 MED ORDER — HYDROXYZINE HCL 10 MG PO TABS
10.0000 mg | ORAL_TABLET | Freq: Once | ORAL | Status: DC
  Filled 2023-10-12: qty 1

## 2023-10-12 MED ORDER — ENSURE PLUS HIGH PROTEIN PO LIQD: 237.0000 mL | Freq: Two times a day (BID) | ORAL | Status: DC

## 2023-10-12 MED ORDER — PERFLUTREN LIPID MICROSPHERE
1.0000 mL | INTRAVENOUS | Status: DC | PRN
  Administered 2023-10-12: 1 mL via INTRAVENOUS

## 2023-10-12 MED ORDER — METOPROLOL SUCCINATE ER 50 MG PO TB24
75.0000 mg | ORAL_TABLET | Freq: Every day | ORAL | Status: DC
  Administered 2023-10-13: 75 mg via ORAL
  Filled 2023-10-12: qty 1

## 2023-10-12 MED ORDER — SODIUM CHLORIDE 0.9 % IV SOLN
1.0000 g | Freq: Once | INTRAVENOUS | Status: AC
  Administered 2023-10-12: 1 g via INTRAVENOUS
  Filled 2023-10-12: qty 10

## 2023-10-12 MED ORDER — HEPARIN SODIUM (PORCINE) 5000 UNIT/ML IJ SOLN
5000.0000 [IU] | Freq: Three times a day (TID) | INTRAMUSCULAR | Status: DC
  Administered 2023-10-12 (×3): 5000 [IU] via SUBCUTANEOUS
  Filled 2023-10-12 (×3): qty 1

## 2023-10-12 MED ORDER — ONDANSETRON HCL 4 MG/2ML IJ SOLN
4.0000 mg | Freq: Four times a day (QID) | INTRAMUSCULAR | Status: DC | PRN
  Administered 2023-10-12: 4 mg via INTRAVENOUS
  Filled 2023-10-12: qty 2

## 2023-10-12 MED ORDER — SODIUM CHLORIDE 0.9 % IV SOLN
1.0000 g | INTRAVENOUS | Status: DC
  Administered 2023-10-12: 1 g via INTRAVENOUS
  Filled 2023-10-12: qty 10

## 2023-10-12 MED ORDER — METOPROLOL TARTRATE 5 MG/5ML IV SOLN
5.0000 mg | Freq: Once | INTRAVENOUS | Status: AC
  Administered 2023-10-12: 5 mg via INTRAVENOUS
  Filled 2023-10-12: qty 5

## 2023-10-12 NOTE — Progress Notes (Addendum)
 PROGRESS NOTE   Mary [name]  FMW:[id number] DOB: 1926/05/12 DOA: 10/11/2023 PCP: Randol Dawes, MD   Date of Service: the patient was seen and examined on 10/12/2023  Brief Narrative:  89 y.o. female with medical history significant of hypertension, hyperlipidemia, urinary retention with suprapubic catheter, history of Her-2 positive breast cancer status post right mastectomy in 2007, osteoarthritis of multiple joints, RLS, chronic HFmrEF, paroxysmal atrial tachycardia, RBBB presenting to the ED with a several week history of early satiety, increased abdominal distention and an episode of near syncope.  Upon evaluation in the emergency department CT imaging of the abdomen pelvis revealed a 4.4 cm hypoenhancing lesion in the pancreatic tail compatible with pancreatic adenocarcinoma with evidence of multifocal hepatic metastases.     Assessment & Plan Near syncope Episodes of witnessed near syncope Review of telemetry and EKG reveals intermittent atrial fibrillation which certainly could be causing intermittent lightheadedness/presyncope Urinalysis abnormal, unclear as to whether patient has a urinary tract infection clinically but the presence of infection could also be the culprit - will keep patient on empiric antibiotic therapy for now Continuing to monitor on telemetry Managing atrial fibrillation with intermittent intravenous metoprolol  and titration of usual home regimen of oral metoprolol .  Paroxysmal atrial fibrillation with rapid ventricular response (HCC) Patient exhibiting intermittent bouts of atrial fibrillation throughout the afternoon, notable on telemetry New diagnosis (although patient has a previous diagnosis of paroxysmal atrial tachycardia ), possibly precipitated by underlying metastatic malignancy. Patient is typically on scheduled Toprol  XL which was held on admission and I have resumed this afternoon.  The brief hiatus likely exacerbated this condition. Managing with  intermittent doses of intravenous metoprolol  and will concurrently titrate scheduled oral metoprolol  upwards Considering patient's wish to be discharged home on hospice in the next 24 hours will avoid full dose anticoagulation this time Monitoring on telemetry Pancreatic carcinoma metastatic to liver [facility]) Incidental finding of pancreatic mass with evidence of metastatic disease to the liver  Case reviewed with Dr. Timmy who has evaluated the patient at the bedside this afternoon.  Patient is not a candidate for therapy extremely poor prognosis  Goals of care, counseling/discussion Considering new diagnosis of suspected metastatic pancreatic cancer patient and daughter have opted for the patient to be discharged home with home hospice Patient/family prefer [facility] hospice services I discussed the case with them and they feel that hospice can likely be arranged for likely discharge on 7/26 Abnormal urinalysis Abnormal urinalysis on arrival,  Patient was given a dose of ceftriaxone  in the emergency department Indwelling suprapubic catheter so unclear as to whether patient has a true infection Considering patient's vague presentation we will continue empiric antibiotics for now and likely treat with a 3-5 day course of therapy Urine culture pending Suprapubic catheter (HCC) According to daughter, catheter is due to be exchanged in several days Will make arrangements for catheter to be changed today by urology nursing staff. Primary hypertension Resuming home regimen of metoprolol  As needed intravenous hydralazine  for markedly elevated blood pressures. Hyponatremia Mild hyponatremia that appears chronic Possibly SIADH? Conservative management for now, monitoring sodium levels with serial chemistries. Restless leg syndrome Resuming outpatient regimen of gabapentin  nightly Documented intolerance to gabapentin  in the chart saying that patient gets nightmares from it however patient still  takes it    Subjective:  Patient complaining of vague intermittent abdominal discomfort.  Patient is currently not experiencing any of the lightheadedness that precipitated her presentation.  Physical Exam:  Vitals:   10/12/23 0200 10/12/23 0236 10/12/23 9761  10/12/23 0746  BP: (!) 165/93 (!) 164/109 (!) 178/104 (!) 148/115  Pulse: 72 78 79 90  Resp:  18  16  Temp:  98.1 F (36.7 C)  97.9 F (36.6 C)  TempSrc:  Oral  Oral  SpO2: 95% 98% 96% 93%  Weight:   49.7 kg   Height:   5' 3 (1.6 m)     Constitutional: Awake alert and oriented x3, no associated distress.   Skin: no rashes, no lesions, good skin turgor noted. Eyes: Pupils are equally reactive to light.  No evidence of scleral icterus or conjunctival pallor.  ENMT: Moist mucous membranes noted.  Posterior pharynx clear of any exudate or lesions.   Respiratory: clear to auscultation bilaterally, no wheezing, no crackles. Normal respiratory effort. No accessory muscle use.  Cardiovascular: Irregularly irregular rate and rhythm.  No murmurs / rubs / gallops. No extremity edema. 2+ pedal pulses. No carotid bruits.  Abdomen: Mild epigastric tenderness.  Abdomen is soft.  No evidence of intra-abdominal masses.  Positive bowel sounds noted in all quadrants.   Musculoskeletal: No joint deformity upper and lower extremities. Good ROM, no contractures. Normal muscle tone.    Data Reviewed:  I have personally reviewed and interpreted labs, imaging.  Significant findings are   CBC: Recent Labs  Lab 10/11/23 2126  WBC 6.3  NEUTROABS 4.5  HGB 12.4  HCT 39.0  MCV 88.6  PLT 304   Basic Metabolic Panel: Recent Labs  Lab 10/11/23 2126  NA 130*  K 3.9  CL 91*  CO2 29  GLUCOSE 126*  BUN 19  CREATININE 0.77  CALCIUM  9.5   GFR: Estimated Creatinine Clearance: 33.7 mL/min (by C-G formula based on SCr of 0.77 mg/dL). Liver Function Tests: Recent Labs  Lab 10/11/23 2126  AST 19  ALT 15  ALKPHOS 139*  BILITOT 0.8   PROT 7.5  ALBUMIN 3.6     EKG: Personally reviewed.  Rhythm is atrial fibrillation with heart rate of 93 bpm.  No dynamic ST segment changes appreciated.   Code Status:  DNR.  Code status decision has been confirmed with: patient Family Communication: Daughter is at the bedside who has been updated on plan of care.   Severity of Illness:  The appropriate patient status for this patient is INPATIENT. Inpatient status is judged to be reasonable and necessary in order to provide the required intensity of service to ensure the patient's safety. The patient's presenting symptoms, physical exam findings, and initial radiographic and laboratory data in the context of their chronic comorbidities is felt to place them at high risk for further clinical deterioration. Furthermore, it is not anticipated that the patient will be medically stable for discharge from the hospital within 2 midnights of admission.   * I certify that at the point of admission it is my clinical judgment that the patient will require inpatient hospital care spanning beyond 2 midnights from the point of admission due to high intensity of service, high risk for further deterioration and high frequency of surveillance required.*  Time spent:  60 minutes  Author:  Zachary JINNY Ba MD  10/12/2023 9:47 AM

## 2023-10-12 NOTE — H&P (Signed)
 History and Physical    Mary [name] FMW:[id number] DOB: 25-May-1926 DOA: 10/11/2023  PCP: Randol Dawes, MD  Patient coming from: Home  Chief Complaint: Abdominal distention, near syncope  HPI: Mary [name] is a 89 y.o. female with medical history significant of hypertension, hyperlipidemia, urinary retention with suprapubic catheter, history of breast cancer status post right mastectomy in 2007, osteoporosis, osteoarthritis of multiple joints, RLS, chronic HFmrEF, paroxysmal atrial tachycardia, PACs, RBBB presenting to the ED for evaluation of abdominal distention and near syncope.  Patient reports having abdominal distention and early satiety for several weeks.  She has lost about 6 pounds weight.  Also had intermittent upper abdominal discomfort and has been constipated and taking a stool softener.  Tonight after dinner she went on a walk using her walker and her home health aide was there with her.  As she was returning to her house, she started feeling lightheaded and felt like she was going to pass out so her home health aide helped lower her to the ground.  She did not fall, hit her head, or sustain any injuries.  Per daughter, patient did not lose consciousness.  Patient states she has a suprapubic catheter which is exchanged every month and flushed every week.  States her catheter was last exchanged 2 weeks ago and flushed 2 days ago.  Denies fevers or chills.  Denies shortness breath, chest pain, or palpitations.  ED Course: No fever, tachycardia, hypotension, or hypoxia.  Labs showing no leukocytosis, hemoglobin stable, sodium 130, chloride 91, glucose 126, alk phos 139, transaminases and T. bili normal, lipase normal, initial troponin negative and repeat pending.  Urine cloudy in appearance with large amount of leukocytes and microscopy showing 6-10 RBCs, >50 WBCs, and no bacteria.  Urine culture in process.  CT head showing no acute intracranial abnormality.  CT abdomen pelvis showing a 4.4 cm  hypoenhancing lesion in the pancreatic tail compatible with pancreatic adenocarcinoma and there is also evidence of multifocal hepatic metastases.  Patient was given 500 mL normal saline.  Review of Systems:  Review of Systems  All other systems reviewed and are negative.   Past Medical History:  Diagnosis Date   Alopecia    Ashkenazi Jewish ancestry    Cancer [facility])    Dyspnea    External hemorrhoid    Foley catheter in place    Heart palpitations no cardiologist--  monitored by pcp   per pt has been on verapamil  and lanoxin  for years and no palpitations for a very long time   History of acute bronchitis    dx 12-21-2014  finished [facility]   History of adenomatous polyp of colon    2003   History of basal cell carcinoma excision    2006  left nasolabial fold   History of benign colon tumor    2007  --  HIGH GRADE HYPERPLASTIC ,  S/P  LEFT HEMICOLECTOMY   History of breast cancer ONCOLOGIST-  DR LAYLA--  antiestogen therapy with femera completed 12/  2012--  no recurrence   dx 10/  2007  right breast Invasive DCIS, grade 2, Stage 2A, pT2  pN0 pMX,  (ER 100% /PR 3% +),  HER +3) with 1 metastatic axill node---  s/p  right mastectomy    History of kidney stones    Hypertension    Hypotonic bladder UROLOGIST-  DR CHALES   Mixed hyperlipidemia    OA (osteoarthritis)    hip   Osteoporosis    DEXA 01/2009; T-3.2 L  fem neck, -2.4 L radius   Urinary retention    02-13-2015    Past Surgical History:  Procedure Laterality Date   APPENDECTOMY  age 69   BREAST LUMPECTOMY WITH NEEDLE LOCALIZATION AND AXILLARY SENTINEL LYMPH NODE BX Right 01-23-2006   CARPAL TUNNEL RELEASE Right 04-24-2007   and Pulley Release index and small finger   CATARACT EXTRACTION W/ INTRAOCULAR LENS  IMPLANT, BILATERAL  left 1991  &  right 1993   COLONOSCOPY  last one 2013   CYSTOSCOPY N/A 07/12/2015   Procedure: CYSTOSCOPY;  Surgeon: Arlena Gal, MD;  Location: [facility] [place];  Service:  Urology;  Laterality: N/A;   CYSTOSCOPY WITH BIOPSY N/A 03/01/2015   Procedure: CYSTOSCOPY WITH TAUBER BIOPSY OF 1CM RIGHT LATERAL BLADDER WALL AND CAUTERIZATION OF BIOPSY SITE ;  Surgeon: Arlena Gal, MD;  Location: [facility] [place];  Service: Urology;  Laterality: N/A;   CYSTOSCOPY WITH LITHOLAPAXY N/A 09/06/2021   Procedure: CYSTOSCOPY WITH LITHOLAPAXY < 2cm, HOLMIUM LASER LITHOTRIPSY, BILATERAL RETROGRADE PYELOGRAM, SUPRAPUBIC CATHETER EXCHANGE;  Surgeon: Nieves Cough, MD;  Location: [facility];  Service: Urology;  Laterality: N/A;  ONLY NEEDS 90 MIN   CYSTOSCOPY WITH LITHOLAPAXY N/A 01/23/2023   Procedure: CYSTOSCOPY WITH LITHOLAPAXY;  Surgeon: Nieves Cough, MD;  Location: [facility];  Service: Urology;  Laterality: N/A;   INSERTION OF SUPRAPUBIC CATHETER N/A 07/12/2015   Procedure: INSERTION OF SUPRAPUBIC CATHETER;  Surgeon: Arlena Gal, MD;  Location: [facility] [place];  Service: Urology;  Laterality: N/A;   INSERTION OF SUPRAPUBIC CATHETER N/A 01/23/2023   Procedure: SUPRAPUBIC CATHETER EXCHANGE;  Surgeon: Nieves Cough, MD;  Location: [facility];  Service: Urology;  Laterality: N/A;  75 MINS   LAPAROSCOPIC ASSISTED LEFT HEMICOLECTOMY  06/  2007   high grade hyperplastic mass   MASTECTOMY Right 02-28-2006   PARTIAL KNEE ARTHROPLASTY Right 01/24/2013   Procedure: RIGHT KNEE MEDIAL UNICOMPARTMENTAL ARTHROPLASTY;  Surgeon: Dempsey LULLA Moan, MD;  Location: [facility];  Service: Orthopedics;  Laterality: Right;   PULLEY RELEASE LEFT INDEX AND SMALL FINGER  08-07-2007   TONSILLECTOMY  age 22     reports that she has never smoked. She has never been exposed to tobacco smoke. She has never used smokeless tobacco. She reports that she does not drink alcohol  and does not use drugs.  Allergies  Allergen Reactions   Gabapentin  Other (See Comments)    Nightmares    Arimidex [Anastrozole] Rash   Avelox [Moxifloxacin Hcl In Nacl] Nausea Only and Rash   Azithromycin   Nausea Only and Rash   Bactrim Nausea Only and Rash   Biaxin [Clarithromycin] Nausea Only and Rash   Nitrofurantoin Nausea Only, Rash and Other (See Comments)   Penicillins Nausea Only and Rash   Sulfa Antibiotics Nausea And Vomiting, Rash and Other (See Comments)    GI intolerance   Sulfur Nausea And Vomiting and Rash    Family History  Problem Relation Age of Onset   Hypertension Mother    Stroke Father    Diabetes Father    Hyperlipidemia Brother    Atrial fibrillation Brother    Hyperlipidemia Son    Breast cancer Paternal Aunt        dx 1s; deceased early 34s   Breast cancer Cousin        paternal first cousin   Colon cancer Cousin        paternal first cousin; age dx 51s    Prior to Admission medications   Medication Sig Start Date  End Date Taking? Authorizing Provider  nitrofurantoin (MACRODANTIN) 50 MG capsule Take 50 mg by mouth daily. 09/10/23  Yes [provider]  Acetaminophen  500 MG capsule Take 1,000 mg by mouth 2 (two) times daily after a meal.    [provider]  atorvastatin  (LIPITOR) 40 MG tablet Take 1 tablet (40 mg total) by mouth daily. 09/26/23   Randol Dawes, MD  bisacodyl  (DULCOLAX) 5 MG EC tablet Take 5-15 mg by mouth See admin instructions. Take 5 mg by mouth once a day and 15 mg once daily for constipation    [provider]  Cholecalciferol  (VITAMIN D3) 50 MCG (2000 UT) TABS Take 2,000 Units by mouth daily. 08/03/20   Magrinat, Sandria BROCKS, MD  denosumab  (PROLIA ) 60 MG/ML SOLN injection Inject 60 mg into the skin every 6 (six) months. Administer in upper arm, thigh, or abdomen Patient not taking: Reported on 09/12/2023 10/24/12   [provider]  diazepam  (VALIUM ) 2 MG tablet TAKE 1 TABLET(2 MG) BY MOUTH EVERY 8 HOURS AS NEEDED FOR MUSCLE SPASMS 05/15/23   Randol Dawes, MD  fluticasone (CUTIVATE) 0.005 % ointment Apply 1 Application topically daily as needed (irritation- affected area). Patient not taking: Reported on 09/26/2023     [provider]  furosemide  (LASIX ) 20 MG tablet Take 1 tablet (20 mg total) by mouth 3 (three) times a week. 02/09/23   Patsy Lenis, MD  gabapentin  (NEURONTIN ) 100 MG capsule TAKE 2-3 CAPSULES BY MOUTH AT BEDTIME 09/26/23   Randol Dawes, MD  GEMTESA 75 MG TABS Take 75 mg by mouth every other day. 09/06/23   [provider]  LINZESS 72 MCG capsule Take 72 mcg by mouth daily before breakfast. 05/21/23   [provider]  LITHOSTAT 250 MG TABS Take 250 mg by mouth See admin instructions. Take 250 mg by mouth once a day- cut into TWO halves and taken together 01/26/23   [provider]  loratadine  (CLARITIN ) 10 MG tablet Take 10 mg by mouth daily.    [provider]  metoprolol  succinate (TOPROL -XL) 50 MG 24 hr tablet Take 1 tablet (50 mg total) by mouth daily. Take with or immediately following a meal. 06/29/23   Court Dorn PARAS, MD  Multiple Vitamins-Minerals (PRESERVISION AREDS 2) CAPS Take 1 capsule by mouth 2 (two) times daily. Patient taking differently: Take 1 capsule by mouth 2 (two) times daily. Patient takes this medication sometimes    [provider]  ondansetron  (ZOFRAN  ODT) 4 MG disintegrating tablet Take 1 tablet (4 mg total) by mouth every 8 (eight) hours as needed for nausea or vomiting. 02/01/23   Randol Dawes, MD  Polyvinyl Alcohol -Povidone (REFRESH OP) Place 1 drop into both eyes 2 (two) times daily.    [provider]  spironolactone  (ALDACTONE ) 25 MG tablet Take 0.5 tablets (12.5 mg total) by mouth daily. 05/15/23   Court Dorn PARAS, MD  traMADol  (ULTRAM ) 50 MG tablet Take 25-50 mg by mouth every 8 (eight) hours as needed (for pain).    [provider]  triamcinolone  cream (KENALOG ) 0.1 % APPLY TOPICALLY TO THE AFFECTED AREA TWICE DAILY Patient taking differently: 1 Application as needed. 08/23/23   Randol Dawes, MD    Physical Exam: Vitals:   10/11/23 2116 10/11/23 2120  BP: (!) 146/86   Pulse: 91   Resp: (!) 24    Temp: 98.3 F (36.8 C)   TempSrc: Oral   SpO2: 97%   Weight:  52.2 kg  Height:  5' 3 (  1.6 m)    Physical Exam Vitals reviewed.  Constitutional:      General: She is not in acute distress. HENT:     Head: Normocephalic and atraumatic.  Eyes:     Extraocular Movements: Extraocular movements intact.  Cardiovascular:     Rate and Rhythm: Normal rate and regular rhythm.     Pulses: Normal pulses.  Pulmonary:     Effort: Pulmonary effort is normal. No respiratory distress.     Breath sounds: Normal breath sounds. No wheezing or rales.  Abdominal:     General: Bowel sounds are normal. There is distension.     Palpations: Abdomen is soft.     Tenderness: There is no abdominal tenderness. There is no guarding.  Musculoskeletal:     Cervical back: Normal range of motion.     Right lower leg: No edema.     Left lower leg: No edema.  Skin:    General: Skin is warm and dry.  Neurological:     General: No focal deficit present.     Mental Status: She is alert and oriented to person, place, and time.     Labs on Admission: I have personally reviewed following labs and imaging studies  CBC: Recent Labs  Lab 10/11/23 2126  WBC 6.3  NEUTROABS 4.5  HGB 12.4  HCT 39.0  MCV 88.6  PLT 304   Basic Metabolic Panel: Recent Labs  Lab 10/11/23 2126  NA 130*  K 3.9  CL 91*  CO2 29  GLUCOSE 126*  BUN 19  CREATININE 0.77  CALCIUM  9.5   GFR: Estimated Creatinine Clearance: 35.4 mL/min (by C-G formula based on SCr of 0.77 mg/dL). Liver Function Tests: Recent Labs  Lab 10/11/23 2126  AST 19  ALT 15  ALKPHOS 139*  BILITOT 0.8  PROT 7.5  ALBUMIN 3.6   Recent Labs  Lab 10/11/23 2126  LIPASE 35   No results for input(s): AMMONIA in the last 168 hours. Coagulation Profile: No results for input(s): INR, PROTIME in the last 168 hours. Cardiac Enzymes: No results for input(s): CKTOTAL, CKMB, CKMBINDEX, TROPONINI in the last 168 hours. BNP (last 3  results) No results for input(s): PROBNP in the last 8760 hours. HbA1C: No results for input(s): HGBA1C in the last 72 hours. CBG: No results for input(s): GLUCAP in the last 168 hours. Lipid Profile: No results for input(s): CHOL, HDL, LDLCALC, TRIG, CHOLHDL, LDLDIRECT in the last 72 hours. Thyroid  Function Tests: No results for input(s): TSH, T4TOTAL, FREET4, T3FREE, THYROIDAB in the last 72 hours. Anemia Panel: No results for input(s): VITAMINB12, FOLATE, FERRITIN, TIBC, IRON, RETICCTPCT in the last 72 hours. Urine analysis:    Component Value Date/Time   COLORURINE YELLOW 10/11/2023 2215   APPEARANCEUR CLOUDY (A) 10/11/2023 2215   LABSPEC 1.009 10/11/2023 2215   PHURINE 6.0 10/11/2023 2215   GLUCOSEU NEGATIVE 10/11/2023 2215   HGBUR SMALL (A) 10/11/2023 2215   BILIRUBINUR NEGATIVE 10/11/2023 2215   BILIRUBINUR negative 12/25/2020 1629   BILIRUBINUR neg 04/10/2016 1442   KETONESUR NEGATIVE 10/11/2023 2215   PROTEINUR 30 (A) 10/11/2023 2215   UROBILINOGEN 0.2 12/25/2020 1629   UROBILINOGEN 0.2 02/22/2013 0447   NITRITE NEGATIVE 10/11/2023 2215   LEUKOCYTESUR LARGE (A) 10/11/2023 2215    Radiological Exams on Admission: CT ABDOMEN PELVIS W CONTRAST Result Date: 10/11/2023 EXAM: CT ABDOMEN AND PELVIS WITH CONTRAST 10/11/2023 10:49:52 PM TECHNIQUE: CT of the abdomen and pelvis was performed with the administration of intravenous contrast. Multiplanar reformatted images  are provided for review. Automated exposure control, iterative reconstruction, and/or weight based adjustment of the mA/kV was utilized to reduce the radiation dose to as low as reasonably achievable. COMPARISON: 02/03/2023 CLINICAL HISTORY: Abdominal pain, acute, nonlocalized. Table formatting from the original note was not included. Notes from triage: Pt BIB ems for a fall today, states the caregiver was near here when she became dizzy and eased her to the ground. Denies  hitting her head, no pain anywhere, no blood thinners, no syncope. A\T\O x4. Hx of HTN. VS stable. FINDINGS: LOWER CHEST: No acute abnormality. LIVER: New multifocal hepatic metastases, approximately 15 in number, measuring up to 2.7 cm in the left hepatic lobe. GALLBLADDER AND BILE DUCTS: Gallbladder is unremarkable. No biliary ductal dilatation. SPLEEN: No acute abnormality. PANCREAS: 4.4 x 3.7 cm hypoenhancing lesion in the pancreatic tail, new and compatible with pancreatic adenocarcinoma. Associated distal pancreatic tail atrophy with ductal dilatation. No vascular invasion. ADRENAL GLANDS: No acute abnormality. KIDNEYS, URETERS AND BLADDER: Bilateral renal cysts, including a 4.7 cm simple right lower pole renal cyst, benign (Bosniak 1). No follow up is recommended. No stones in the kidneys or ureters. No hydronephrosis. No perinephric or periureteral stranding. Thick walled, decompressed bladder with suprapubic foley catheter in the anterior bladder dome. GI AND BOWEL: Stomach demonstrates no acute abnormality. There is no bowel obstruction. No bowel wall thickening. PERITONEUM AND RETROPERITONEUM: No ascites. No free air. VASCULATURE: Atherosclerotic calcifications of the abdominal aorta and branch vessels, although patent. LYMPH NODES: No lymphadenopathy. REPRODUCTIVE ORGANS: Status post hysterectomy. BONES AND SOFT TISSUES: Degenerative changes of the bilateral hips, severe on the right and mild on the left. Lumbar dextroscoliosis with degenerative changes. No acute osseous abnormality. No focal soft tissue abnormality. IMPRESSION: 1. 4.4 cm hypoenhancing lesion in the pancreatic tail, compatible with pancreatic adenocarcinoma. 2. Multifocal hepatic metastases, as above. 3. No vascular invasion. Electronically signed by: Pinkie Pebbles MD 10/11/2023 11:00 PM EDT RP Workstation: [id number]   CT HEAD WO CONTRAST ( ) Result Date: 10/11/2023 EXAM: CT HEAD WITHOUT 10/11/2023 10:49:52 PM TECHNIQUE: CT of  the head was performed without the administration of intravenous contrast. Automated exposure control, iterative reconstruction, and/or weight based adjustment of the mA/kV was utilized to reduce the radiation dose to as low as reasonably achievable. COMPARISON: None available. CLINICAL HISTORY: Mental status change, unknown cause. Table formatting from the original note was not included. Notes from triage: Pt BIB ems for a fall today, states the caregiver was near here when she became dizzy and eased her to the ground. Denies hitting her head, no pain anywhere, no blood thinners, no syncope. A\T\O x4. Hx of HTN. VS stable. FINDINGS: BRAIN AND VENTRICLES: No acute intracranial hemorrhage. No mass effect or midline shift. No extra-axial fluid collection. Gray-white differentiation is maintained. No hydrocephalus. Global cortical atrophy. Subcortical and periventricular small vessel ischemic changes. ORBITS: No acute abnormality. SINUSES AND MASTOIDS: No acute abnormality. SOFT TISSUES AND SKULL: No acute skull fracture. No acute soft tissue abnormality. IMPRESSION: 1. No acute intracranial abnormality. 2. Atrophy with small vessel ischemic changes. Electronically signed by: Pinkie Pebbles MD 10/11/2023 10:55 PM EDT RP Workstation: [id number]    EKG: Independently reviewed.  LAFB, RBBB, ?atrial flutter.  Assessment and Plan  Near syncope Dehydration/orthostasis could be contributing as she is on diuretics including Lasix  and spironolactone .  CT head showing no acute intracranial abnormality and no focal neurodeficit on exam.  EKG without STEMI and troponin x 1 negative.  Patient is not endorsing chest pain.  PE less  likely given no tachycardia or hypoxia.  EKG showing possible atrial flutter although rate controlled.  Do not see previous documented history of A-fib/flutter in the chart.  Last echo done in November 2024 showing EF 45 to 50%, mildly elevated pulmonary artery systolic pressure, left atrium  moderately dilated, trivial mitral regurgitation.  Continue cardiac monitoring, IV gentle fluid hydration, check orthostatics, hold home antihypertensives/diuretics, trend troponin, and follow-up repeat EKG and echocardiogram in the morning.  Metastatic pancreatic cancer CT showing findings compatible with pancreatic adenocarcinoma with multifocal hepatic metastases.  Discussed with the patient and her daughter.  Given her age, patient is not sure if she would want to undergo chemotherapy or any aggressive treatments.  She wants to talk to oncology to decide whether biopsy/treatment versus palliative care would be the best approach.  Consult oncology in the morning.  Mild hyponatremia Likely due to poor p.o. intake/dehydration.  Continue gentle IV fluid hydration and monitor sodium level.  Check serum osmolarity.  ?Catheter associated urinary tract infection Patient has a suprapubic catheter.  UA with evidence of pyuria but no bacteriuria.  It seems her suprapubic catheter is well maintained/exchanged regularly.  No fever or leukocytosis.  No stones in the kidneys or ureters seen on CT.  No perinephric or periureteral stranding.  Will give a dose of ceftriaxone  for now and follow-up urine culture.  Hypertension Hold antihypertensives at this time and check orthostatics.  Chronic HFmrEF No signs of volume overload.  Hyperlipidemia RLS Pharmacy med rec pending.  DVT prophylaxis: SQ Heparin Code Status: DNR/DNI (discussed with the patient and her daughter) Family Communication: Daughter at bedside. Level of care: Telemetry bed Admission status: It is my clinical opinion that referral for OBSERVATION is reasonable and necessary in this patient based on the above information provided. The aforementioned taken together are felt to place the patient at high risk for further clinical deterioration. However, it is anticipated that the patient may be medically stable for discharge from the hospital  within 24 to 48 hours.  Editha Ram MD [facility]  If 7PM-7AM, please contact night-coverage [email or web address]  10/12/2023, 12:24 AM

## 2023-10-12 NOTE — Assessment & Plan Note (Signed)
 Incidental finding of pancreatic mass with evidence of metastatic disease to the liver  Case reviewed with Dr. Timmy who has evaluated the patient at the bedside this afternoon.  Patient is not a candidate for therapy extremely poor prognosis

## 2023-10-12 NOTE — Progress Notes (Signed)
 Per order, suprapubic cath exchanged. 18 fr replaced and attached to new bag. Urine return noted.  Pt tolerated well.
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
## 2023-10-12 NOTE — Hospital Course (Addendum)
 89yo with h/o HTN, HLD, urinary retention with suprapubic catheter, breast CA s/p remote mastectomy, RLS, chronic HFrEF, and paroxysmal atrial tachycardia who presented on 7/24 with early satiety, abdominal distention, and near syncope.  SHe was found to have a presumed pancreatic adenocarcinoma with hepatic mets.  Oncology consulted and patient/family opted for home with hospice.

## 2023-10-12 NOTE — Assessment & Plan Note (Signed)
 Considering new diagnosis of suspected metastatic pancreatic cancer patient and daughter have opted for the patient to be discharged home with home hospice Patient/family prefer [facility] hospice services I discussed the case with them and they feel that hospice can likely be arranged for likely discharge on 7/26

## 2023-10-12 NOTE — Assessment & Plan Note (Signed)
 Resuming outpatient regimen of gabapentin  nightly Documented intolerance to gabapentin  in the chart saying that patient gets nightmares from it however patient still takes it

## 2023-10-12 NOTE — Assessment & Plan Note (Signed)
 Resuming home regimen of metoprolol  As needed intravenous hydralazine  for markedly elevated blood pressures.

## 2023-10-12 NOTE — Progress Notes (Signed)
   Brief Palliative Medicine Progress Note:  PMT consult received and chart reviewed. Goals of care completed, followed up with the admitted team and oncology. Oncology recommends transition to hospice due to poor prognosis of metastatic pancreatic cancer, risks of toxicity with treatment, functional limitations, and advanced age. Spoke with pts daughters Avelina and [name] and both in agreement with plan to transition home with hospice support.   Recommendations: D/c home with hospice support  Continue Tylenol  PRN, consider adding opioid PRN pain not relieved by Tylenol  TOC, admitting, hospice liaison, and oncology aware PMT will continue to follow to d/c   Thank you for allowing us  to participate in the care of Mary [name]  Signed by: Laymon Pinal, NP Palliative Medicine Team [facility] CHARGE  Team Phone # [phone] (Nights/Weekends)  10/12/2023, 9:27 PM

## 2023-10-12 NOTE — Assessment & Plan Note (Signed)
 According to daughter, catheter is due to be exchanged in several days Will make arrangements for catheter to be changed today by urology nursing staff.
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
## 2023-10-12 NOTE — Assessment & Plan Note (Signed)
 Abnormal urinalysis on arrival,  Patient was given a dose of ceftriaxone  in the emergency department Indwelling suprapubic catheter so unclear as to whether patient has a true infection Considering patient's vague presentation we will continue empiric antibiotics for now and likely treat with a 3-5 day course of therapy Urine culture pending

## 2023-10-12 NOTE — Assessment & Plan Note (Signed)
 Mild hyponatremia that appears chronic Possibly SIADH? Conservative management for now, monitoring sodium levels with serial chemistries.

## 2023-10-12 NOTE — Telephone Encounter (Signed)
 Mary [name] called in to speak with you about her mom, and the new diagnosis from the hospital. (Pancreatic cancer)

## 2023-10-12 NOTE — Assessment & Plan Note (Signed)
 Episodes of witnessed near syncope Review of telemetry and EKG reveals intermittent atrial fibrillation which certainly could be causing intermittent lightheadedness/presyncope Urinalysis abnormal, unclear as to whether patient has a urinary tract infection clinically but the presence of infection could also be the culprit - will keep patient on empiric antibiotic therapy for now Continuing to monitor on telemetry Managing atrial fibrillation with intermittent intravenous metoprolol  and titration of usual home regimen of oral metoprolol .

## 2023-10-12 NOTE — Assessment & Plan Note (Addendum)
 Patient exhibiting intermittent bouts of atrial fibrillation throughout the afternoon, notable on telemetry New diagnosis (although patient has a previous diagnosis of paroxysmal atrial tachycardia ), possibly precipitated by underlying metastatic malignancy. Patient is typically on scheduled Toprol  XL which was held on admission and I have resumed this afternoon.  The brief hiatus likely exacerbated this condition. Managing with intermittent doses of intravenous metoprolol  and will concurrently titrate scheduled oral metoprolol  upwards Considering patient's wish to be discharged home on hospice in the next 24 hours will avoid full dose anticoagulation this time Monitoring on telemetry

## 2023-10-13 DIAGNOSIS — R55 Syncope and collapse: Secondary | ICD-10-CM | POA: Diagnosis not present

## 2023-10-13 LAB — COMPREHENSIVE METABOLIC PANEL WITH GFR
ALT: 13 U/L (ref 0–44)
AST: 17 U/L (ref 15–41)
Albumin: 3.4 g/dL — ABNORMAL LOW (ref 3.5–5.0)
Alkaline Phosphatase: 119 U/L (ref 38–126)
Anion gap: 10 (ref 5–15)
BUN: 14 mg/dL (ref 8–23)
CO2: 24 mmol/L (ref 22–32)
Calcium: 9.2 mg/dL (ref 8.9–10.3)
Chloride: 98 mmol/L (ref 98–111)
Creatinine, Ser: 0.56 mg/dL (ref 0.44–1.00)
GFR, Estimated: >60 mL/min (ref >60)
Glucose, Bld: 111 mg/dL — ABNORMAL HIGH (ref 70–99)
Potassium: 3.6 mmol/L (ref 3.5–5.1)
Sodium: 132 mmol/L — ABNORMAL LOW (ref 135–145)
Total Bilirubin: 0.9 mg/dL (ref 0.0–1.2)
Total Protein: 6.7 g/dL (ref 6.5–8.1)

## 2023-10-13 LAB — CANCER ANTIGEN 19-9: CA 19-9: 13237 U/mL — ABNORMAL HIGH (ref 0–35)

## 2023-10-13 LAB — MAGNESIUM: Magnesium: 2.1 mg/dL (ref 1.7–2.4)

## 2023-10-13 LAB — PREALBUMIN: Prealbumin: 16 mg/dL — ABNORMAL LOW (ref 18–38)

## 2023-10-13 MED ORDER — METOPROLOL SUCCINATE ER 25 MG PO TB24: 75.0000 mg | ORAL_TABLET | Freq: Every day | ORAL | 0 refills | Status: DC

## 2023-10-13 MED ORDER — LIDOCAINE 5 % EX PTCH: 1.0000 | MEDICATED_PATCH | CUTANEOUS | Status: DC

## 2023-10-13 MED ORDER — LIDOCAINE 5 % EX PTCH
1.0000 | MEDICATED_PATCH | CUTANEOUS | Status: DC
  Administered 2023-10-13: 1 via TRANSDERMAL
  Filled 2023-10-13: qty 1

## 2023-10-13 MED ORDER — GABAPENTIN 100 MG PO CAPS: 200.0000 mg | ORAL_CAPSULE | Freq: Every day | ORAL | 0 refills | Status: DC

## 2023-10-13 MED ORDER — HYDROXYZINE HCL 25 MG PO TABS: 25.0000 mg | ORAL_TABLET | Freq: Three times a day (TID) | ORAL | 0 refills | Status: DC | PRN

## 2023-10-13 MED ORDER — LIDOCAINE 5 % EX PTCH: 1.0000 | MEDICATED_PATCH | CUTANEOUS | 0 refills | Status: DC

## 2023-10-13 MED ORDER — FUROSEMIDE 20 MG PO TABS
20.0000 mg | ORAL_TABLET | Freq: Every day | ORAL | Status: DC
  Administered 2023-10-13: 20 mg via ORAL
  Filled 2023-10-13: qty 1

## 2023-10-13 MED ORDER — GABAPENTIN 100 MG PO CAPS: 200.0000 mg | ORAL_CAPSULE | Freq: Every day | ORAL | Status: DC

## 2023-10-13 NOTE — Plan of Care (Signed)
  Problem: Clinical Measurements: Goal: Diagnostic test results will improve Outcome: Progressing   Problem: Nutrition: Goal: Adequate nutrition will be maintained Outcome: Progressing   Problem: Elimination: Goal: Will not experience complications related to urinary retention Outcome: Progressing   Problem: Pain Managment: Goal: General experience of comfort will improve and/or be controlled Outcome: Progressing

## 2023-10-13 NOTE — Discharge Summary (Addendum)
 Physician Discharge Summary   Patient: Mary [name] MRN: [id number] DOB: [birth year]  Admit date:     10/11/2023  Discharge date: 10/13/23  Discharge Physician: Delon Herald   PCP: Randol Dawes, MD   Recommendations at discharge:   You are being discharged home with hospice (appointment scheduled for Tuesday, 7/29) Increase Toprol  XL to 75 mg daily Hold the following medications for now: Lasix , spironolactone  (Aldactone ), Preservision, vitamin D3, Lipitor, Cutivate Take the following medications as needed/if desired: Tylenol , Claritin , Refresh OP, Valium  Tessalon  perles, Zofran , Tramadol , Kenalog  cream, melatonin Take hydroxyzine  25 mg up to 3 times daily as needed for anxiety Continue gabapentin  nightly Continue lidoderm  patches as needed for pain  Discharge Diagnoses: Principal Problem:   Near syncope Active Problems:   Mixed hyperlipidemia   Primary hypertension   Hyponatremia   Suprapubic catheter [facility])   Pancreatic carcinoma metastatic to liver [facility])   Pancreatic mass   ACP (advance care planning)   Goals of care, counseling/discussion   Generalized abdominal pain   Palliative care by specialist   Deficient knowledge of hospice care   Paroxysmal atrial fibrillation with rapid ventricular response (HCC)   Abnormal urinalysis   Restless leg syndrome    Hospital Course: 89yo with h/o HTN, HLD, urinary retention with suprapubic catheter, breast CA s/p remote mastectomy, RLS, chronic HFrEF, and paroxysmal atrial tachycardia who presented on 7/24 with early satiety, abdominal distention, and near syncope.  SHe was found to have a presumed pancreatic adenocarcinoma with hepatic mets.  Oncology consulted and patient/family opted for home with hospice.   Assessment and Plan:  Near syncope Episode of witnessed near syncope Review of telemetry and EKG reveals intermittent atrial fibrillation which certainly could be causing intermittent lightheadedness/presyncope Managing  atrial fibrillation with usual home regimen of oral metoprolol   Pancreatic carcinoma metastatic to liver Incidental finding of pancreatic mass with evidence of metastatic disease to the liver  Case reviewed with Dr. Timmy who has evaluated the patient at the bedside and determined that she is not a candidate for therapy Extremely poor prognosis  She is going home with hospice  Paroxysmal atrial fibrillation with rapid ventricular response  Patient exhibiting intermittent bouts of atrial fibrillation New diagnosis (although patient has a previous diagnosis of paroxysmal atrial tachycardia ), possibly precipitated by underlying metastatic malignancy Patient is typically on scheduled Toprol  XL, which was held on admission; the brief hiatus likely exacerbated this condition Managing with titrating oral metoprolol  upwards Considering patient's wish to be discharged home on hospice, will avoid full dose anticoagulation this time  Chronic pain Sees Dr. Bonner Continue tramadol , gabapentin  Further pain control per hospice  Goals of care, counseling/discussion Considering new diagnosis of suspected metastatic pancreatic cancer, patient and daughter have opted for the patient to be discharged home with home hospice Patient/family prefer [facility] hospice services DC to home today Out of facility DNR form signed  Abnormal urinalysis Abnormal urinalysis on arrival Patient was given a dose of ceftriaxone  in the emergency department Indwelling suprapubic catheter so unclear as to whether patient has a true infection Urine culture with >100k colonies GNR Treated with 2 total doses of Rocephin  and will hold additional antibiotics at this time given lack of apparent symptoms  Suprapubic catheter Catheter was exchanged by urology nursing staff Will need ongoing monthly catheter exchanges or as per urology  Primary hypertension Resuming home regimen of metoprolol   Hyponatremia Mild  hyponatremia that appears chronic Possibly SIADH? Not clinically significant  Restless leg syndrome Resuming outpatient regimen of gabapentin   nightly Documented intolerance to gabapentin  in the chart saying that patient gets nightmares from it however patient still takes it  DNR DNR confirmed at the time of admission [facility] documents reviewed Patient will need a gold out of facility DNR form at the time of discharge      Consultants: Oncology Palliative care  Procedures: None  Antibiotics: Ceftriaxone  x 2 doses   Pain control - [place]  Controlled Substance Reporting System database was reviewed. and patient was instructed, not to drive, operate heavy machinery, perform activities at heights, swimming or participation in water  activities or provide baby-sitting services while on Pain, Sleep and Anxiety Medications; until their outpatient Physician has advised to do so again. Also recommended to not to take more than prescribed Pain, Sleep and Anxiety Medications.   Disposition: Home with hospice care Diet recommendation:  Regular diet DISCHARGE MEDICATION: Allergies as of 10/13/2023       Reactions   Gabapentin  Other (See Comments)   Nightmares- still taking even though listed as an allergy    Arimidex [anastrozole] Rash   Avelox [moxifloxacin Hcl In Nacl] Nausea Only, Rash   Azithromycin  Nausea Only, Rash   Bactrim Nausea Only, Rash   Biaxin [clarithromycin] Nausea Only, Rash   Nitrofurantoin Nausea Only, Rash, Other (See Comments)   Penicillins Nausea Only, Rash   Sulfa Antibiotics Nausea And Vomiting, Rash, Other (See Comments)   GI intolerance   Sulfur Nausea And Vomiting, Rash        Medication List     TAKE these medications    gabapentin  100 MG capsule Commonly known as: NEURONTIN  Take 2 capsules (200 mg total) by mouth at bedtime.   hydrOXYzine  25 MG tablet Commonly known as: ATARAX  Take 1 tablet (25 mg total) by mouth 3 (three) times daily  as needed for anxiety.   lidocaine  5 % Commonly known as: LIDODERM  Place 1 patch onto the skin daily. Remove & Discard patch within 12 hours or as directed by MD Start taking on: October 14, 2023   metoprolol  succinate 25 MG 24 hr tablet Commonly known as: TOPROL -XL Take 3 tablets (75 mg total) by mouth daily. Take with or immediately following a meal. Start taking on: October 14, 2023 What changed:  medication strength how much to take        Discharge Exam:   Subjective: Denies pain, wants to go home.   Objective: Vitals:   10/12/23 2250 10/13/23 0608  BP: 101/76 (!) 156/86  Pulse:  83  Resp:  14  Temp:  98.4 F (36.9 C)  SpO2:  96%    Intake/Output Summary (Last 24 hours) at 10/13/2023 1248 Last data filed at 10/13/2023 1017 Gross per 24 hour  Intake 1134.05 ml  Output 1450 ml  Net -315.95 ml   Filed Weights   10/11/23 2120 10/12/23 0238  Weight: 52.2 kg 49.7 kg    Exam:  General:  Appears frail, chronically ill Eyes:  normal lids, iris ENT:  grossly normal hearing, lips & tongue, mmm Cardiovascular:  RRR. No LE edema.  Respiratory:   CTA bilaterally with no wheezes/rales/rhonchi.  Normal respiratory effort. Abdomen:  soft, NT, ND Skin:  no rash or induration seen on limited exam Musculoskeletal:  grossly normal tone BUE/BLE, good ROM, no bony abnormality Psychiatric:  blunted mood and affect, speech sparse but appropriate Neurologic:  CN 2-12 grossly intact, moves all extremities in coordinated fashion  Data Reviewed: I have reviewed the patient's lab results since admission.  Pertinent labs for today  include:   Na++ 132, not clinically significant Glucose 111 Albumin 3.4    Condition at discharge: fair  The results of significant diagnostics from this hospitalization (including imaging, microbiology, ancillary and laboratory) are listed below for reference.   Imaging Studies: ECHOCARDIOGRAM COMPLETE Result Date: 10/12/2023    ECHOCARDIOGRAM  REPORT   Patient Name:   HABIBA TRELOAR Date of Exam: 10/12/2023 Medical Rec #:  [id number]   Height:       63.0 in Accession #:    [id number]  Weight:       109.6 lb Date of Birth:  26-Feb-1927  BSA:          1.497 m Patient Age:    89 years    BP:           148/115 mmHg Patient Gender: F           HR:           84 bpm. Exam Location:  Inpatient Procedure: 2D Echo, Cardiac Doppler, Color Doppler and Intracardiac            Opacification Agent (Both Spectral and Color Flow Doppler were            utilized during procedure). Indications:    Syncope  History:        Patient has prior history of Echocardiogram examinations.                 Signs/Symptoms:Syncope; Risk Factors:Hypertension.  Sonographer:    Vella Key Referring Phys: [id number] VASUNDHRA RATHORE IMPRESSIONS  1. Left ventricular ejection fraction, by estimation, is 60 to 65%. The left ventricle has normal function. The left ventricle has no regional wall motion abnormalities. There is mild concentric left ventricular hypertrophy. Left ventricular diastolic function could not be evaluated.  2. Right ventricular systolic function is normal. The right ventricular size is normal.  3. Left atrial size was mildly dilated.  4. Right atrial size was mildly dilated.  5. The mitral valve is normal in structure. No evidence of mitral valve regurgitation. No evidence of mitral stenosis. Moderate mitral annular calcification.  6. The aortic valve is normal in structure. There is moderate calcification of the aortic valve. There is moderate thickening of the aortic valve. Aortic valve regurgitation is not visualized. Aortic valve sclerosis/calcification is present, without any  evidence of aortic stenosis.  7. The inferior vena cava is normal in size with greater than 50% respiratory variability, suggesting right atrial pressure of 3 mmHg. FINDINGS  Left Ventricle: Left ventricular ejection fraction, by estimation, is 60 to 65%. The left ventricle has normal function. The left  ventricle has no regional wall motion abnormalities. The left ventricular internal cavity size was normal in size. There is  mild concentric left ventricular hypertrophy. Left ventricular diastolic function could not be evaluated due to mitral annular calcification (moderate or greater). Left ventricular diastolic function could not be evaluated. Right Ventricle: The right ventricular size is normal. No increase in right ventricular wall thickness. Right ventricular systolic function is normal. Left Atrium: Left atrial size was mildly dilated. Right Atrium: Right atrial size was mildly dilated. Pericardium: There is no evidence of pericardial effusion. Mitral Valve: The mitral valve is normal in structure. Moderate mitral annular calcification. No evidence of mitral valve regurgitation. No evidence of mitral valve stenosis. MV peak gradient, 3.8 mmHg. The mean mitral valve gradient is 2.0 mmHg. Tricuspid Valve: The tricuspid valve is normal in structure. Tricuspid valve regurgitation is mild . No evidence of  tricuspid stenosis. Aortic Valve: The aortic valve is normal in structure. There is moderate calcification of the aortic valve. There is moderate thickening of the aortic valve. Aortic valve regurgitation is not visualized. Aortic valve sclerosis/calcification is present, without any evidence of aortic stenosis. Pulmonic Valve: The pulmonic valve was grossly normal. Pulmonic valve regurgitation is not visualized. No evidence of pulmonic stenosis. Aorta: The aortic root is normal in size and structure. Venous: The inferior vena cava is normal in size with greater than 50% respiratory variability, suggesting right atrial pressure of 3 mmHg. IAS/Shunts: No atrial level shunt detected by color flow Doppler.  LEFT VENTRICLE PLAX 2D LVIDd:         4.50 cm     Diastology LVIDs:         3.10 cm     LV e' medial:    4.13 cm/s LV PW:         1.10 cm     LV E/e' medial:  19.2 LV IVS:        1.10 cm     LV e' lateral:    9.03 cm/s LVOT diam:     1.40 cm     LV E/e' lateral: 8.8 LVOT Area:     1.54 cm  LV Volumes (MOD) LV vol d, MOD A2C: 58.2 ml LV vol d, MOD A4C: 59.0 ml LV vol s, MOD A2C: 23.8 ml LV vol s, MOD A4C: 33.4 ml LV SV MOD A2C:     34.4 ml LV SV MOD A4C:     59.0 ml LV SV MOD BP:      29.4 ml RIGHT VENTRICLE RV Basal diam:  3.40 cm RV S prime:     8.92 cm/s TAPSE (M-mode): 1.8 cm LEFT ATRIUM           Index        RIGHT ATRIUM           Index LA diam:      3.70 cm 2.47 cm/m   RA Area:     18.50 cm LA Vol (A2C): 47.1 ml 31.46 ml/m  RA Volume:   54.90 ml  36.67 ml/m LA Vol (A4C): 54.8 ml 36.60 ml/m   AORTA Ao Root diam: 2.70 cm MITRAL VALVE MV Area (PHT): 3.03 cm     SHUNTS MV Peak grad:  3.8 mmHg     Systemic Diam: 1.40 cm MV Mean grad:  2.0 mmHg MV Vmax:       0.98 m/s MV Vmean:      63.3 cm/s MV Decel Time: 250 msec MV E velocity: 79.30 cm/s MV A velocity: 107.00 cm/s MV E/A ratio:  0.74 Morene Brownie Electronically signed by Morene Brownie Signature Date/Time: 10/12/2023/5:06:35 PM    Final    CT ABDOMEN PELVIS W CONTRAST Result Date: 10/11/2023 EXAM: CT ABDOMEN AND PELVIS WITH CONTRAST 10/11/2023 10:49:52 PM TECHNIQUE: CT of the abdomen and pelvis was performed with the administration of intravenous contrast. Multiplanar reformatted images are provided for review. Automated exposure control, iterative reconstruction, and/or weight based adjustment of the mA/kV was utilized to reduce the radiation dose to as low as reasonably achievable. COMPARISON: 02/03/2023 CLINICAL HISTORY: Abdominal pain, acute, nonlocalized. Table formatting from the original note was not included. Notes from triage: Pt BIB ems for a fall today, states the caregiver was near here when she became dizzy and eased her to the ground. Denies hitting her head, no pain anywhere, no blood thinners, no syncope. A\T\O x4. Hx of  HTN. VS stable. FINDINGS: LOWER CHEST: No acute abnormality. LIVER: New multifocal hepatic metastases, approximately 15  in number, measuring up to 2.7 cm in the left hepatic lobe. GALLBLADDER AND BILE DUCTS: Gallbladder is unremarkable. No biliary ductal dilatation. SPLEEN: No acute abnormality. PANCREAS: 4.4 x 3.7 cm hypoenhancing lesion in the pancreatic tail, new and compatible with pancreatic adenocarcinoma. Associated distal pancreatic tail atrophy with ductal dilatation. No vascular invasion. ADRENAL GLANDS: No acute abnormality. KIDNEYS, URETERS AND BLADDER: Bilateral renal cysts, including a 4.7 cm simple right lower pole renal cyst, benign (Bosniak 1). No follow up is recommended. No stones in the kidneys or ureters. No hydronephrosis. No perinephric or periureteral stranding. Thick walled, decompressed bladder with suprapubic foley catheter in the anterior bladder dome. GI AND BOWEL: Stomach demonstrates no acute abnormality. There is no bowel obstruction. No bowel wall thickening. PERITONEUM AND RETROPERITONEUM: No ascites. No free air. VASCULATURE: Atherosclerotic calcifications of the abdominal aorta and branch vessels, although patent. LYMPH NODES: No lymphadenopathy. REPRODUCTIVE ORGANS: Status post hysterectomy. BONES AND SOFT TISSUES: Degenerative changes of the bilateral hips, severe on the right and mild on the left. Lumbar dextroscoliosis with degenerative changes. No acute osseous abnormality. No focal soft tissue abnormality. IMPRESSION: 1. 4.4 cm hypoenhancing lesion in the pancreatic tail, compatible with pancreatic adenocarcinoma. 2. Multifocal hepatic metastases, as above. 3. No vascular invasion. Electronically signed by: Pinkie Pebbles MD 10/11/2023 11:00 PM EDT RP Workstation: [id number]   CT HEAD WO CONTRAST ( ) Result Date: 10/11/2023 EXAM: CT HEAD WITHOUT 10/11/2023 10:49:52 PM TECHNIQUE: CT of the head was performed without the administration of intravenous contrast. Automated exposure control, iterative reconstruction, and/or weight based adjustment of the mA/kV was utilized to reduce the  radiation dose to as low as reasonably achievable. COMPARISON: None available. CLINICAL HISTORY: Mental status change, unknown cause. Table formatting from the original note was not included. Notes from triage: Pt BIB ems for a fall today, states the caregiver was near here when she became dizzy and eased her to the ground. Denies hitting her head, no pain anywhere, no blood thinners, no syncope. A\T\O x4. Hx of HTN. VS stable. FINDINGS: BRAIN AND VENTRICLES: No acute intracranial hemorrhage. No mass effect or midline shift. No extra-axial fluid collection. Gray-white differentiation is maintained. No hydrocephalus. Global cortical atrophy. Subcortical and periventricular small vessel ischemic changes. ORBITS: No acute abnormality. SINUSES AND MASTOIDS: No acute abnormality. SOFT TISSUES AND SKULL: No acute skull fracture. No acute soft tissue abnormality. IMPRESSION: 1. No acute intracranial abnormality. 2. Atrophy with small vessel ischemic changes. Electronically signed by: Pinkie Pebbles MD 10/11/2023 10:55 PM EDT RP Workstation: [id number]    Microbiology: Results for orders placed or performed during the hospital encounter of 10/11/23  Urine Culture     Status: Abnormal (Preliminary result)   Collection Time: 10/11/23 10:15 PM   Specimen: Urine, Random  Result Value Ref Range Status   Specimen Description   Final    URINE, RANDOM Performed at [facility], [address], [place], [place] [address]    Special Requests   Final    NONE Reflexed from [id number] Performed at [facility], [address], [place], [place] [address]    Culture (A)  Final    >=100,000 COLONIES/mL STENOTROPHOMONAS MALTOPHILIA CULTURE REINCUBATED FOR BETTER GROWTH Performed at [facility], [address] [place], [place] [address]    Report Status PENDING  Incomplete   Organism ID, Bacteria STENOTROPHOMONAS MALTOPHILIA (A)  Final  Susceptibility   Stenotrophomonas  maltophilia - MIC*    LEVOFLOXACIN 0.25 SENSITIVE Sensitive     TRIMETH/SULFA <=20 SENSITIVE Sensitive     * >=100,000 COLONIES/mL STENOTROPHOMONAS MALTOPHILIA    Labs: CBC: Recent Labs  Lab 10/11/23 2126  WBC 6.3  NEUTROABS 4.5  HGB 12.4  HCT 39.0  MCV 88.6  PLT 304   Basic Metabolic Panel: Recent Labs  Lab 10/11/23 2126 10/12/23 2315 10/13/23 0629  NA 130*  --  132*  K 3.9  --  3.6  CL 91*  --  98  CO2 29  --  24  GLUCOSE 126*  --  111*  BUN 19  --  14  CREATININE 0.77  --  0.56  CALCIUM  9.5  --  9.2  MG  --  1.9 2.1   Liver Function Tests: Recent Labs  Lab 10/11/23 2126 10/13/23 0629  AST 19 17  ALT 15 13  ALKPHOS 139* 119  BILITOT 0.8 0.9  PROT 7.5 6.7  ALBUMIN 3.6 3.4*   CBG: No results for input(s): GLUCAP in the last 168 hours.  Discharge time spent: greater than 30 minutes.  Signed: Delon Herald, MD [facility] 10/13/2023

## 2023-10-13 NOTE — Progress Notes (Signed)
 Mary [name] [address] [facility] Liaison Note   Received request from [place], Transitions of Care Manager, for hospice services at home after discharge. Spoke with daughter [name] to initiate education related to hospice philosophy, services, and team approach to care. Patient/family verbalized understanding of information given. Per discussion, the plan is for discharge home by PTAR/EMS or private vehicle on today.   DME needs discussed. Patient has the following equipment in the home: Manual Wheelchair, walker, raised toilet, grab bars  Patient/family requests the following equipment for delivery: None at this time    The address has been verified and is correct in the chart.     Please send signed and completed DNR home with patient/family. Please provide prescriptions at discharge as needed to ensure ongoing symptom management.   [facility] information and contact numbers given to [place].   Above information shared with team via secure chat.   Please call with any questions or concerns.   Thank you for the opportunity to participate in this patient's care.     Nat Babe, BSN, RN Hospice Nurse Liaison [phone]

## 2023-10-13 NOTE — Progress Notes (Signed)
 Daily Progress Note   Patient Name: Mary [name]       Date: 10/13/2023 DOB: 06/13/1926  Age: 89 y.o. MRN#: [id number] Attending Physician: Barbarann Nest, MD Primary Care Physician: Randol Dawes, MD Admit Date: 10/11/2023  Reason for Consultation/Follow-up: Establishing goals of care  Subjective: Awake alert, resting in bed, no distress, appears weak, daughter and caregiver at bedside  Length of Stay: 1  Current Medications: Scheduled Meds:   feeding supplement  237 mL Oral BID BM   gabapentin   200 mg Oral QHS   heparin   5,000 Units Subcutaneous Q8H   lidocaine   1 patch Transdermal Q24H   metoprolol  succinate  75 mg Oral Daily    Continuous Infusions:  cefTRIAXone  (ROCEPHIN )  IV 1 g (10/12/23 2313)    PRN Meds: acetaminophen  **OR** acetaminophen , hydrALAZINE , hydrOXYzine , ondansetron  (ZOFRAN ) IV  Physical Exam         Awake alert No distress Regular work of breathing Generalized weakness  Vital Signs: BP (!) 156/86 (BP Location: Left Arm)   Pulse 83   Temp 98.4 F (36.9 C) (Oral)   Resp 14   Ht 5' 3 (1.6 m)   Wt 49.7 kg   LMP  (LMP Unknown)   SpO2 96%   BMI 19.41 kg/m  SpO2: SpO2: 96 % O2 Device: O2 Device: Room Air O2 Flow Rate:    Intake/output summary:  Intake/Output Summary (Last 24 hours) at 10/13/2023 1021 Last data filed at 10/13/2023 1017 Gross per 24 hour  Intake 1134.05 ml  Output 1450 ml  Net -315.95 ml   LBM: Last BM Date : 10/10/23 Baseline Weight: Weight: 52.2 kg Most recent weight: Weight: 49.7 kg       Palliative Assessment/Data:      Patient Active Problem List   Diagnosis Date Noted   Pancreatic carcinoma metastatic to liver (HCC) 10/12/2023   Pancreatic mass 10/12/2023   ACP (advance care planning) 10/12/2023   Goals of care,  counseling/discussion 10/12/2023   Generalized abdominal pain 10/12/2023   Palliative care by specialist 10/12/2023   Deficient knowledge of hospice care 10/12/2023   Paroxysmal atrial fibrillation with rapid ventricular response (HCC) 10/12/2023   Abnormal urinalysis 10/12/2023   Restless leg syndrome 10/12/2023   Near syncope 10/11/2023   CHF (congestive heart failure) (HCC) 02/04/2023   Acute exacerbation of congestive  heart failure (HCC) 02/03/2023   Chronic idiopathic constipation 02/03/2023   Paroxysmal atrial tachycardia (HCC) 02/03/2023   Suprapubic catheter (HCC) 11/09/2022   Acute on chronic combined systolic and diastolic CHF (congestive heart failure) (HCC) 11/01/2022   PAT (paroxysmal atrial tachycardia) (HCC) 11/01/2022   DCM (dilated cardiomyopathy) (HCC) 11/01/2022   CHF exacerbation (HCC) 10/31/2022   Heart failure with mid-range ejection fraction (HFmEF) (HCC) 10/30/2022   Dyspnea on exertion 10/30/2022   Pleural effusion, bilateral 10/30/2022   New onset of congestive heart failure (HCC) 10/30/2022   Acute UTI (urinary tract infection) 03/21/2022   URI (upper respiratory infection) 03/21/2022   Hyponatremia 03/21/2022   Hypocalcemia 03/21/2022   Asymptomatic bacteriuria 03/21/2022   Sinus arrhythmia 03/21/2022   Bladder stones 08/17/2021   Presbycusis of both ears 04/12/2021   Postnasal drip 03/01/2017   Abnormal auditory perception of left ear 12/09/2015   Bilateral impacted cerumen 11/26/2015   Ashkenazi Jewish ancestry    OA (osteoarthritis) of knee 01/24/2013   Osteoporosis 06/01/2011   Malignant neoplasm of upper-outer quadrant of right breast in female, estrogen receptor positive (HCC) 04/03/2011   Primary hypertension 12/01/2010   Mixed hyperlipidemia 10/19/2010   Palpitations 10/19/2010   Atony of bladder 10/19/2010    Palliative Care Assessment & Plan   Patient Profile:    Assessment:  89 y.o. female  with past medical history of  hypertension, hyperlipidemia, urinary retention with suprapubic catheter, history of breast cancer status post right mastectomy in 2007, osteoporosis, osteoarthritis of multiple joints, RLS, chronic HFmrEF, paroxysmal atrial tachycardia, PACs, RBBB admitted on 10/11/2023 with abdominal pain, abdominal distention, early satiety, and near syncope.  Had a 6 pound weight loss.   In the workup of her symptoms, CT abdomen and pelvis with a 4.4 cm hypoenhancing lesion in the pancreatic tail compatible with pancreatic adenocarcinoma with evidence of hepatic metastasis.  Oncology has been consulted.  Recommendations/Plan:   DNR Home with hospice Add lidocaine  patch  Code Status:    Code Status Orders  (From admission, onward)           Start     Ordered   10/12/23 0142  Do not attempt resuscitation (DNR)- Limited -Do Not Intubate (DNI)  Continuous       Question Answer Comment  If pulseless and not breathing No CPR or chest compressions.   In Pre-Arrest Conditions (Patient Is Breathing and Has A Pulse) Do not intubate. Provide all appropriate non-invasive medical interventions. Avoid ICU transfer unless indicated or required.   Consent: Discussion documented in EHR or advanced directives reviewed      10/12/23 0144           Code Status History     Date Active Date Inactive Code Status Order ID Comments User Context   02/03/2023 2104 02/08/2023 2055 Limited: Do not attempt resuscitation (DNR) -DNR-LIMITED -Do Not Intubate/DNI  [id number]  Emmy Justus DEL, MD ED   02/03/2023 2032 02/03/2023 2104 Full Code [id number]  Emmy Justus DEL, MD ED   10/30/2022 2301 11/04/2022 1753 Full Code [id number]  Hugelmeyer, Alexis, DO ED   03/21/2022 1827 03/24/2022 2206 Full Code [id number]  Celinda Alm Lot, MD ED   01/24/2013 1842 01/26/2013 1633 Full Code [id number]  Aluisio, Dempsey GAILS, MD Inpatient      Advance Directive Documentation    Flowsheet Row Most Recent Value  Type of Advance Directive Living  will  Pre-existing out of facility DNR order (yellow form or pink MOST form) --  MOST Form in  Place? --    Prognosis:  < 6 months  Discharge Planning: Home with Hospice  Care plan was discussed with patient, caregiver, daughter from [place] at bedside.   Thank you for allowing the Palliative Medicine Team to assist in the care of this patient. Mod MDM.     Greater than 50%  of this time was spent counseling and coordinating care related to the above assessment and plan.  Lonia Serve, MD  Please contact Palliative Medicine Team phone at [phone] for questions and concerns.

## 2023-10-13 NOTE — TOC Transition Note (Signed)
 Transition of Care [facility]) - Discharge Note   Patient Details  Name: Mary [name] MRN: [id number] Date of Birth: [birth year]  [facility] CM/SW Contact:  Tawni CHRISTELLA Eva, LCSW Phone Number: 10/13/2023, 12:10 PM   Clinical Narrative:     CSW received message from palliative MD pt rec for home hospice , pt's daughter has chosen [facility]. ACC has spoken with pt's daughter and pt to d/c home today under [facility] hospice. PTAR called . Care Management sign off.  Final next level of care: Home w Hospice Care Barriers to Discharge: Barriers Resolved   Patient Goals and CMS Choice Patient states their goals for this hospitalization and ongoing recovery are:: return home with hospice CMS Medicare.gov Compare Post Acute Care list provided to:: Patient Represenative (must comment) Choice offered to / list presented to : Adult Children      Discharge Placement                  Name of family member notified: Marvine Pulling (Daughter)  [phone] (Mobile) Patient and family notified of of transfer: 10/13/23  Discharge Plan and Services Additional resources added to the After Visit Summary for                                       Social Drivers of Health (SDOH) Interventions SDOH Screenings   Food Insecurity: No Food Insecurity (10/12/2023)  Housing: Low Risk  (10/12/2023)  Transportation Needs: No Transportation Needs (10/12/2023)  Utilities: Not At Risk (10/12/2023)  Alcohol  Screen: Low Risk  (09/26/2023)  Depression (PHQ2-9): Low Risk  (09/26/2023)  Financial Resource Strain: Low Risk  (09/26/2023)  Physical Activity: Inactive (09/26/2023)  Social Connections: Socially Isolated (10/12/2023)  Stress: No Stress Concern Present (09/26/2023)  Tobacco Use: Low Risk  (10/11/2023)  Health Literacy: Inadequate Health Literacy (09/26/2023)     Readmission Risk Interventions     No data to display

## 2023-10-14 LAB — URINE CULTURE: Culture: >=100000 — AB

## 2023-10-15 LAB — CANCER ANTIGEN 19-9: CA 19-9: 11111 U/mL — ABNORMAL HIGH (ref 0–35)

## 2023-10-16 DIAGNOSIS — I10 Essential (primary) hypertension: Secondary | ICD-10-CM | POA: Diagnosis not present

## 2023-10-16 DIAGNOSIS — N319 Neuromuscular dysfunction of bladder, unspecified: Secondary | ICD-10-CM | POA: Diagnosis not present

## 2023-10-16 DIAGNOSIS — C787 Secondary malignant neoplasm of liver and intrahepatic bile duct: Secondary | ICD-10-CM | POA: Diagnosis not present

## 2023-10-16 DIAGNOSIS — J302 Other seasonal allergic rhinitis: Secondary | ICD-10-CM | POA: Diagnosis not present

## 2023-10-16 DIAGNOSIS — G2581 Restless legs syndrome: Secondary | ICD-10-CM | POA: Diagnosis not present

## 2023-10-16 DIAGNOSIS — M810 Age-related osteoporosis without current pathological fracture: Secondary | ICD-10-CM | POA: Diagnosis not present

## 2023-10-16 DIAGNOSIS — I5022 Chronic systolic (congestive) heart failure: Secondary | ICD-10-CM | POA: Diagnosis not present

## 2023-10-16 DIAGNOSIS — Z853 Personal history of malignant neoplasm of breast: Secondary | ICD-10-CM | POA: Diagnosis not present

## 2023-10-16 DIAGNOSIS — C259 Malignant neoplasm of pancreas, unspecified: Secondary | ICD-10-CM | POA: Diagnosis not present

## 2023-10-16 DIAGNOSIS — E782 Mixed hyperlipidemia: Secondary | ICD-10-CM | POA: Diagnosis not present

## 2023-10-17 ENCOUNTER — Telehealth: Payer: Self-pay | Admitting: *Deleted

## 2023-10-17 ENCOUNTER — Other Ambulatory Visit: Payer: Self-pay | Admitting: *Deleted

## 2023-10-17 DIAGNOSIS — C259 Malignant neoplasm of pancreas, unspecified: Secondary | ICD-10-CM | POA: Diagnosis not present

## 2023-10-17 DIAGNOSIS — I10 Essential (primary) hypertension: Secondary | ICD-10-CM | POA: Diagnosis not present

## 2023-10-17 DIAGNOSIS — C787 Secondary malignant neoplasm of liver and intrahepatic bile duct: Secondary | ICD-10-CM | POA: Diagnosis not present

## 2023-10-17 DIAGNOSIS — Z853 Personal history of malignant neoplasm of breast: Secondary | ICD-10-CM | POA: Diagnosis not present

## 2023-10-17 DIAGNOSIS — I5022 Chronic systolic (congestive) heart failure: Secondary | ICD-10-CM | POA: Diagnosis not present

## 2023-10-17 DIAGNOSIS — E782 Mixed hyperlipidemia: Secondary | ICD-10-CM | POA: Diagnosis not present

## 2023-10-17 NOTE — Progress Notes (Signed)
 Received call from pt daughter [name], requesting to schedule pt for Celiac Block.  Pt currently home with [facility].  RN reviewed with MD and verbal orders received proceed with Celiac Block.  RN contacted IR who states an order for Radiology Eval needed to be placed for radiology team to review and have a consult with pt.  IR also states they will reach out to pt after consult to set up Celiac Block.  RN placed call to [facility] and updated them on plan of care.  Per MD, Hospice to hopefully control pain and Celiac Block not needed. Hospice verbalized understanding.

## 2023-10-17 NOTE — Telephone Encounter (Signed)
 Received an earlier call from [name] inquiring about scheduling a celiac block for her mother as she is in a lot of pain.  Dr Timmy out of the office today.  A message was sent to Dr Timmy by Annabella Potters LPN inquiring about scheduling this.  Called [name] after reading a Chart message from [facility] nurse stating that they are managing her care.  Odette [name] we just wanted to make sure that her moms needs were met and at this time we will back out since her care is being managed by [facility].  [name] was very appreciative of Dr Melvern care and appreciated the phone call.

## 2023-10-18 DIAGNOSIS — Z853 Personal history of malignant neoplasm of breast: Secondary | ICD-10-CM | POA: Diagnosis not present

## 2023-10-18 DIAGNOSIS — I10 Essential (primary) hypertension: Secondary | ICD-10-CM | POA: Diagnosis not present

## 2023-10-18 DIAGNOSIS — C259 Malignant neoplasm of pancreas, unspecified: Secondary | ICD-10-CM | POA: Diagnosis not present

## 2023-10-18 DIAGNOSIS — C787 Secondary malignant neoplasm of liver and intrahepatic bile duct: Secondary | ICD-10-CM | POA: Diagnosis not present

## 2023-10-18 DIAGNOSIS — I5022 Chronic systolic (congestive) heart failure: Secondary | ICD-10-CM | POA: Diagnosis not present

## 2023-10-18 DIAGNOSIS — E782 Mixed hyperlipidemia: Secondary | ICD-10-CM | POA: Diagnosis not present

## 2023-10-19 DIAGNOSIS — I5022 Chronic systolic (congestive) heart failure: Secondary | ICD-10-CM | POA: Diagnosis not present

## 2023-10-19 DIAGNOSIS — J302 Other seasonal allergic rhinitis: Secondary | ICD-10-CM | POA: Diagnosis not present

## 2023-10-19 DIAGNOSIS — E782 Mixed hyperlipidemia: Secondary | ICD-10-CM | POA: Diagnosis not present

## 2023-10-19 DIAGNOSIS — C259 Malignant neoplasm of pancreas, unspecified: Secondary | ICD-10-CM | POA: Diagnosis not present

## 2023-10-19 DIAGNOSIS — N319 Neuromuscular dysfunction of bladder, unspecified: Secondary | ICD-10-CM | POA: Diagnosis not present

## 2023-10-19 DIAGNOSIS — M810 Age-related osteoporosis without current pathological fracture: Secondary | ICD-10-CM | POA: Diagnosis not present

## 2023-10-19 DIAGNOSIS — G2581 Restless legs syndrome: Secondary | ICD-10-CM | POA: Diagnosis not present

## 2023-10-19 DIAGNOSIS — I10 Essential (primary) hypertension: Secondary | ICD-10-CM | POA: Diagnosis not present

## 2023-10-19 DIAGNOSIS — Z853 Personal history of malignant neoplasm of breast: Secondary | ICD-10-CM | POA: Diagnosis not present

## 2023-10-19 DIAGNOSIS — C787 Secondary malignant neoplasm of liver and intrahepatic bile duct: Secondary | ICD-10-CM | POA: Diagnosis not present

## 2023-10-24 ENCOUNTER — Telehealth: Payer: Self-pay | Admitting: *Deleted

## 2023-10-24 NOTE — Telephone Encounter (Signed)
 Called [phone], they are closed and will open at 8:30am-I will call again then.

## 2023-10-24 NOTE — Telephone Encounter (Signed)
 I haven't gotten any orders/notes/documentation from [facility] yet (that is the hospice that Avelina told me they were using).  They are usually good about faxing over info. Can you call them to see if I was listed as PCP, and if they could fax me info, and that way I can call them back tomorrow when I have information.  I can't address their concerns about what they are doing without knowing what has been discussed.

## 2023-10-24 NOTE — Telephone Encounter (Signed)
 Copied from CRM #[id number]. Topic: Clinical - Medical Advice >> Oct 24, 2023  4:06 PM Graeme ORN wrote: Reason for CRM: Patient daughter called. Would like to speak with Dr Randol or Lucienne. States they are aware patient recently diagnosed with Pancreatic Cancer but she needs someone to speak with patient and reassure her that hospice is doing the right thing. State patient having a fit about it. Caller does not live in [place]. Thank You  Do you want me to call or would you like to?

## 2023-10-25 DIAGNOSIS — E782 Mixed hyperlipidemia: Secondary | ICD-10-CM | POA: Diagnosis not present

## 2023-10-25 DIAGNOSIS — I10 Essential (primary) hypertension: Secondary | ICD-10-CM | POA: Diagnosis not present

## 2023-10-25 DIAGNOSIS — C259 Malignant neoplasm of pancreas, unspecified: Secondary | ICD-10-CM | POA: Diagnosis not present

## 2023-10-25 DIAGNOSIS — C787 Secondary malignant neoplasm of liver and intrahepatic bile duct: Secondary | ICD-10-CM | POA: Diagnosis not present

## 2023-10-25 DIAGNOSIS — Z853 Personal history of malignant neoplasm of breast: Secondary | ICD-10-CM | POA: Diagnosis not present

## 2023-10-25 DIAGNOSIS — I5022 Chronic systolic (congestive) heart failure: Secondary | ICD-10-CM | POA: Diagnosis not present

## 2023-10-25 NOTE — Telephone Encounter (Signed)
 Spoke with patient. She states she is doing fairly well. He main complaint is that of her chronic neck pain.  Abdominal pain comes and goes. She reports that it is managed well with extra strength tylenol , and doesn't want to take the methadone or hydrocodone  that was prescribed.  Initially advised that if tylenol  truly controls pain, stronger meds aren't needed, but best to have those at home in order to prevent ER visits for pain.  She is in agreement with this.  Talked to Janace is coming late tonight with one of her daughters.  She states that the methadone is working, but made her sleepy. The nurse Redell recommended cutting the dose to 1/2 of the 5 mg tablet, and take it twice daily, and use tylenol  (or hydrocodone ) if needed for breakthrough pain.  Hospice records in media reviewed--Dr. [facility] is listed as doctor for both attending and hospice physician (which is why I didn't receive anything.  Dr. Bonner prescribed hydrocodone  before Hospice started.  Redell Ar [phone] Left message on his VM, hoping to speak with him before his visit with her later today

## 2023-10-25 NOTE — Telephone Encounter (Signed)
 They are faxing over plan of care now.

## 2023-10-26 DIAGNOSIS — Z853 Personal history of malignant neoplasm of breast: Secondary | ICD-10-CM | POA: Diagnosis not present

## 2023-10-26 DIAGNOSIS — C787 Secondary malignant neoplasm of liver and intrahepatic bile duct: Secondary | ICD-10-CM | POA: Diagnosis not present

## 2023-10-26 DIAGNOSIS — I5022 Chronic systolic (congestive) heart failure: Secondary | ICD-10-CM | POA: Diagnosis not present

## 2023-10-26 DIAGNOSIS — I10 Essential (primary) hypertension: Secondary | ICD-10-CM | POA: Diagnosis not present

## 2023-10-26 DIAGNOSIS — C259 Malignant neoplasm of pancreas, unspecified: Secondary | ICD-10-CM | POA: Diagnosis not present

## 2023-10-26 DIAGNOSIS — E782 Mixed hyperlipidemia: Secondary | ICD-10-CM | POA: Diagnosis not present
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
## 2023-10-27 DIAGNOSIS — I10 Essential (primary) hypertension: Secondary | ICD-10-CM | POA: Diagnosis not present

## 2023-10-27 DIAGNOSIS — Z853 Personal history of malignant neoplasm of breast: Secondary | ICD-10-CM | POA: Diagnosis not present

## 2023-10-27 DIAGNOSIS — C259 Malignant neoplasm of pancreas, unspecified: Secondary | ICD-10-CM | POA: Diagnosis not present

## 2023-10-27 DIAGNOSIS — C787 Secondary malignant neoplasm of liver and intrahepatic bile duct: Secondary | ICD-10-CM | POA: Diagnosis not present

## 2023-10-27 DIAGNOSIS — I5022 Chronic systolic (congestive) heart failure: Secondary | ICD-10-CM | POA: Diagnosis not present

## 2023-10-27 DIAGNOSIS — E782 Mixed hyperlipidemia: Secondary | ICD-10-CM | POA: Diagnosis not present

## 2023-10-28 DIAGNOSIS — Z853 Personal history of malignant neoplasm of breast: Secondary | ICD-10-CM | POA: Diagnosis not present

## 2023-10-28 DIAGNOSIS — C787 Secondary malignant neoplasm of liver and intrahepatic bile duct: Secondary | ICD-10-CM | POA: Diagnosis not present

## 2023-10-28 DIAGNOSIS — I10 Essential (primary) hypertension: Secondary | ICD-10-CM | POA: Diagnosis not present

## 2023-10-28 DIAGNOSIS — C259 Malignant neoplasm of pancreas, unspecified: Secondary | ICD-10-CM | POA: Diagnosis not present

## 2023-10-28 DIAGNOSIS — I5022 Chronic systolic (congestive) heart failure: Secondary | ICD-10-CM | POA: Diagnosis not present

## 2023-10-28 DIAGNOSIS — E782 Mixed hyperlipidemia: Secondary | ICD-10-CM | POA: Diagnosis not present

## 2023-11-02 DIAGNOSIS — C259 Malignant neoplasm of pancreas, unspecified: Secondary | ICD-10-CM | POA: Diagnosis not present

## 2023-11-02 DIAGNOSIS — I5022 Chronic systolic (congestive) heart failure: Secondary | ICD-10-CM | POA: Diagnosis not present

## 2023-11-02 DIAGNOSIS — C787 Secondary malignant neoplasm of liver and intrahepatic bile duct: Secondary | ICD-10-CM | POA: Diagnosis not present

## 2023-11-02 DIAGNOSIS — Z853 Personal history of malignant neoplasm of breast: Secondary | ICD-10-CM | POA: Diagnosis not present

## 2023-11-02 DIAGNOSIS — E782 Mixed hyperlipidemia: Secondary | ICD-10-CM | POA: Diagnosis not present

## 2023-11-02 DIAGNOSIS — I10 Essential (primary) hypertension: Secondary | ICD-10-CM | POA: Diagnosis not present

## 2023-11-08 DIAGNOSIS — I10 Essential (primary) hypertension: Secondary | ICD-10-CM | POA: Diagnosis not present

## 2023-11-08 DIAGNOSIS — C259 Malignant neoplasm of pancreas, unspecified: Secondary | ICD-10-CM | POA: Diagnosis not present

## 2023-11-08 DIAGNOSIS — I5022 Chronic systolic (congestive) heart failure: Secondary | ICD-10-CM | POA: Diagnosis not present

## 2023-11-08 DIAGNOSIS — Z853 Personal history of malignant neoplasm of breast: Secondary | ICD-10-CM | POA: Diagnosis not present

## 2023-11-08 DIAGNOSIS — E782 Mixed hyperlipidemia: Secondary | ICD-10-CM | POA: Diagnosis not present

## 2023-11-08 DIAGNOSIS — C787 Secondary malignant neoplasm of liver and intrahepatic bile duct: Secondary | ICD-10-CM | POA: Diagnosis not present

## 2023-11-09 DIAGNOSIS — I5022 Chronic systolic (congestive) heart failure: Secondary | ICD-10-CM | POA: Diagnosis not present

## 2023-11-09 DIAGNOSIS — E782 Mixed hyperlipidemia: Secondary | ICD-10-CM | POA: Diagnosis not present

## 2023-11-09 DIAGNOSIS — C259 Malignant neoplasm of pancreas, unspecified: Secondary | ICD-10-CM | POA: Diagnosis not present

## 2023-11-09 DIAGNOSIS — I10 Essential (primary) hypertension: Secondary | ICD-10-CM | POA: Diagnosis not present

## 2023-11-09 DIAGNOSIS — Z853 Personal history of malignant neoplasm of breast: Secondary | ICD-10-CM | POA: Diagnosis not present

## 2023-11-09 DIAGNOSIS — C787 Secondary malignant neoplasm of liver and intrahepatic bile duct: Secondary | ICD-10-CM | POA: Diagnosis not present

## 2023-11-12 ENCOUNTER — Ambulatory Visit
Admission: RE | Admit: 2023-11-12 | Discharge: 2023-11-12 | Disposition: A | Discharge status: Home / Self Care | Source: Ambulatory Visit | Attending: Hematology and Oncology

## 2023-11-12 ENCOUNTER — Other Ambulatory Visit (HOSPITAL_COMMUNITY): Payer: Self-pay | Admitting: Interventional Radiology

## 2023-11-12 DIAGNOSIS — C787 Secondary malignant neoplasm of liver and intrahepatic bile duct: Secondary | ICD-10-CM | POA: Diagnosis not present

## 2023-11-12 DIAGNOSIS — Z853 Personal history of malignant neoplasm of breast: Secondary | ICD-10-CM | POA: Diagnosis not present

## 2023-11-12 DIAGNOSIS — C259 Malignant neoplasm of pancreas, unspecified: Secondary | ICD-10-CM | POA: Diagnosis not present

## 2023-11-12 DIAGNOSIS — I5022 Chronic systolic (congestive) heart failure: Secondary | ICD-10-CM | POA: Diagnosis not present

## 2023-11-12 DIAGNOSIS — I10 Essential (primary) hypertension: Secondary | ICD-10-CM | POA: Diagnosis not present

## 2023-11-12 DIAGNOSIS — E782 Mixed hyperlipidemia: Secondary | ICD-10-CM | POA: Diagnosis not present

## 2023-11-12 HISTORY — PX: IR RADIOLOGIST EVAL & MGMT: IMG5224

## 2023-11-12 NOTE — Progress Notes (Signed)
 Reason for visit: Consultation today for celiac plexus block evaluation   Care Team(s): Primary Care; Mary Dawes, MD Medical Oncology; Mary Potts MD   Virtual Visit via Telephone Note   I connected with [name] on 11/12/23 by telephone and verified that I am speaking with the correct person using two identifiers. I discussed the limitations, risks, security and privacy concerns of performing an evaluation and management service by telephone and the availability of in-person appointments. They are joined in the visit by her adult daughter Mary [name]), who is a reliable historian.   History of Present Illness:  Mrs. Mary [name] is a 89 y.o. female comorbid, including CAD w HF and tachycardia, and remote breast CA s/p mastectomy, chronic urinary retention w SPT and recent diagnosis of metastatic pancreatic CA. Pt had preented to ER on 10/11/23 s/p fall w workup including CT CAP. Imaging was remarkable for a pancreatic mass and liver metastases. She was seen by Oncology, Dr Mary [name], and upon family discussion was recommended for home hospice.  Pt w 24/7 caregiver at home, and reported abdominal pain that has been difficult to control w hydrocodone  and methadone. She was referred to vascular interventional radiology in an attempt to improve her QoL and managing her cancer-related pain with a celiac plexus block.   ROS was positive for nausea, decreasing appetite and early satiety. A 12 point ROS discussed and pertinent positives are indicated in the HPI above.  All other systems are negative.  Past Medical History:  Diagnosis Date   Alopecia    Ashkenazi Jewish ancestry    Cancer [facility])    Dyspnea    External hemorrhoid    Foley catheter in place    Heart palpitations no cardiologist--  monitored by pcp   per pt has been on verapamil  and lanoxin  for years and no palpitations for a very long time   History of acute bronchitis    dx 12-21-2014  finished [facility]   History of  adenomatous polyp of colon    2003   History of basal cell carcinoma excision    2006  left nasolabial fold   History of benign colon tumor    2007  --  HIGH GRADE HYPERPLASTIC ,  S/P  LEFT HEMICOLECTOMY   History of breast cancer ONCOLOGIST-  DR Mary [name]--  antiestogen therapy with femera completed 12/  2012--  no recurrence   dx 10/  2007  right breast Invasive DCIS, grade 2, Stage 2A, pT2  pN0 pMX,  (ER 100% /PR 3% +),  HER +3) with 1 metastatic axill node---  s/p  right mastectomy    History of kidney stones    Hypertension    Hypotonic bladder UROLOGIST-  DR Mary [name]   Mixed hyperlipidemia    OA (osteoarthritis)    hip   Osteoporosis    DEXA 01/2009; T-3.2 L fem neck, -2.4 L radius   Urinary retention    02-13-2015    Past Surgical History:  Procedure Laterality Date   APPENDECTOMY  age 69   BREAST LUMPECTOMY WITH NEEDLE LOCALIZATION AND AXILLARY SENTINEL LYMPH NODE BX Right 01-23-2006   CARPAL TUNNEL RELEASE Right 04-24-2007   and Pulley Release index and small finger   CATARACT EXTRACTION W/ INTRAOCULAR LENS  IMPLANT, BILATERAL  left 1991  &  right 1993   COLONOSCOPY  last one 2013   CYSTOSCOPY N/A 07/12/2015   Procedure: CYSTOSCOPY;  Surgeon: Mary CHALES, MD;  Location: [facility] [place];  Service: Urology;  Laterality:  N/A;   CYSTOSCOPY WITH BIOPSY N/A 03/01/2015   Procedure: CYSTOSCOPY WITH TAUBER BIOPSY OF 1CM RIGHT LATERAL BLADDER WALL AND CAUTERIZATION OF BIOPSY SITE ;  Surgeon: Mary Gal, MD;  Location: [facility];  Service: Urology;  Laterality: N/A;   CYSTOSCOPY WITH LITHOLAPAXY N/A 09/06/2021   Procedure: CYSTOSCOPY WITH LITHOLAPAXY < 2cm, HOLMIUM LASER LITHOTRIPSY, BILATERAL RETROGRADE PYELOGRAM, SUPRAPUBIC CATHETER EXCHANGE;  Surgeon: Mary Cough, MD;  Location: [facility];  Service: Urology;  Laterality: N/A;  ONLY NEEDS 90 MIN   CYSTOSCOPY WITH LITHOLAPAXY N/A 01/23/2023   Procedure: CYSTOSCOPY WITH LITHOLAPAXY;   Surgeon: Mary Cough, MD;  Location: [facility];  Service: Urology;  Laterality: N/A;   INSERTION OF SUPRAPUBIC CATHETER N/A 07/12/2015   Procedure: INSERTION OF SUPRAPUBIC CATHETER;  Surgeon: Mary Gal, MD;  Location: [facility] ;  Service: Urology;  Laterality: N/A;   INSERTION OF SUPRAPUBIC CATHETER N/A 01/23/2023   Procedure: SUPRAPUBIC CATHETER EXCHANGE;  Surgeon: Mary Cough, MD;  Location: [facility];  Service: Urology;  Laterality: N/A;  75 MINS   IR RADIOLOGIST EVAL & MGMT  11/12/2023   LAPAROSCOPIC ASSISTED LEFT HEMICOLECTOMY  06/  2007   high grade hyperplastic mass   MASTECTOMY Right 02-28-2006   PARTIAL KNEE ARTHROPLASTY Right 01/24/2013   Procedure: RIGHT KNEE MEDIAL UNICOMPARTMENTAL ARTHROPLASTY;  Surgeon: Mary LULLA Moan, MD;  Location: [facility];  Service: Orthopedics;  Laterality: Right;   PULLEY RELEASE LEFT INDEX AND SMALL FINGER  08-07-2007   TONSILLECTOMY  age 15    Allergies: Gabapentin , Arimidex [anastrozole], Avelox [moxifloxacin hcl in nacl], Azithromycin , Bactrim, Biaxin [clarithromycin], Nitrofurantoin, Penicillins, Sulfa antibiotics, and Sulfur  Medications: Prior to Admission medications   Medication Sig Start Date End Date Taking? Authorizing Provider  gabapentin  (NEURONTIN ) 100 MG capsule Take 2 capsules (200 mg total) by mouth at bedtime. 10/13/23   Mary Nest, MD  hydrOXYzine  (ATARAX ) 25 MG tablet Take 1 tablet (25 mg total) by mouth 3 (three) times daily as needed for anxiety. 10/13/23   Mary Nest, MD  lidocaine  (LIDODERM ) 5 % Place 1 patch onto the skin daily. Remove & Discard patch within 12 hours or as directed by MD 10/14/23   Mary Nest, MD  metoprolol  succinate (TOPROL -XL) 25 MG 24 hr tablet Take 3 tablets (75 mg total) by mouth daily. Take with or immediately following a meal. 10/14/23   Mary Nest, MD     Family History  Problem Relation Age of Onset   Hypertension Mother    Stroke Father    Diabetes Father     Hyperlipidemia Brother    Atrial fibrillation Brother    Hyperlipidemia Son    Breast cancer Paternal Aunt        dx 29s; deceased early 45s   Breast cancer Cousin        paternal first cousin   Colon cancer Cousin        paternal first cousin; age dx 83s    Social History   Socioeconomic History   Marital status: Widowed    Spouse name: Not on file   Number of children: 3   Years of education: Not on file   Highest education level: Not on file  Occupational History   Not on file  Tobacco Use   Smoking status: Never    Passive exposure: Never   Smokeless tobacco: Never  Vaping Use   Vaping status: Never Used  Substance and Sexual Activity   Alcohol  use: Never   Drug use: No  Sexual activity: Not Currently    Birth control/protection: Post-menopausal  Other Topics Concern   Not on file  Social History Narrative   Lives alone, no pets.   Children live in [place] and [place]. She has 1 son and 2 daughters; nephew lives in [place].   Widowed 1992/12/08   Her brother passed away (had moved to [place] same time she did)      She no longer drives.   Has caregiver Grayce during the week (9-5, someone else 5-10, and someone overnight)--has 24/7 care.      Updated 08/2023   Social Drivers of Health   Financial Resource Strain: Low Risk  (09/26/2023)   Overall Financial Resource Strain (CARDIA)    Difficulty of Paying Living Expenses: Not hard at all  Food Insecurity: No Food Insecurity (10/12/2023)   Hunger Vital Sign    Worried About Running Out of Food in the Last Year: Never true    Ran Out of Food in the Last Year: Never true  Transportation Needs: No Transportation Needs (10/12/2023)   PRAPARE - Administrator, Civil Service (Medical): No    Lack of Transportation (Non-Medical): No  Physical Activity: Inactive (09/26/2023)   Exercise Vital Sign    Days of Exercise per Week: 0 days    Minutes of Exercise per Session: 0 min  Stress: No Stress Concern Present (09/26/2023)    [employer] of Occupational Health - Occupational Stress Questionnaire    Feeling of Stress: Not at all  Social Connections: Socially Isolated (10/12/2023)   Social Connection and Isolation Panel    Frequency of Communication with Friends and Family: More than three times a week    Frequency of Social Gatherings with Friends and Family: Once a week    Attends Religious Services: Never    Database administrator or Organizations: No    Attends Banker Meetings: Never    Marital Status: Widowed    ECOG Status: 3 - Symptomatic, >50% confined to bed  Review of Systems As above  Vital Signs: LMP  (LMP Unknown)   Physical Exam Deferred secondary to virtual visit   Imaging:    Independently reviewed demonstrating a pancreatic mass with liver lesions.  Anatomy amenable to celiac plexus block.  IR Radiologist Eval & Mgmt Result Date: 11/12/2023 EXAM: NEW PATIENT OFFICE VISIT CHIEF COMPLAINT: See below HISTORY OF PRESENT ILLNESS: See below REVIEW OF SYSTEMS: See below PHYSICAL EXAMINATION: See below ASSESSMENT AND PLAN: Please refer to completed note in the electronic medical record on Mary [name] Epic Thom Hall, MD [facility] Electronically Signed   By: Thom Hall M.D.   On: 11/12/2023 09:50    Labs:  CBC: Recent Labs    02/07/23 0429 02/08/23 0436 09/25/23 1024 10/11/23 12-08-24  WBC 9.8 7.3 6.1 6.3  HGB 12.7 12.8 12.2 12.4  HCT 38.3 40.4 38.8 39.0  PLT 405* 402* 260 304    COAGS: No results for input(s): INR, APTT in the last 8760 hours.  BMP: Recent Labs    02/07/23 0429 02/08/23 0436 09/25/23 1024 10/11/23 12-08-2124 10/13/23 0629  NA 128* 126* 136 130* 132*  K 3.6 4.0 4.8 3.9 3.6  CL 87* 83* 96 91* 98  CO2 29 32 24 29 24   GLUCOSE 128* 126* 97 126* 111*  BUN 21 24* 24 19 14   CALCIUM  9.0 8.9 10.0 9.5 9.2  CREATININE 0.91 1.08* 0.95 0.77 0.56  GFRNONAA 58* 48*  --  >  60 >60     LIVER FUNCTION TESTS: Recent Labs    02/04/23 0450 09/25/23 1024 10/11/23 2126 10/13/23 0629  BILITOT 0.7 0.5 0.8 0.9  AST 20 15 19 17   ALT 25 14 15 13   ALKPHOS 116 167* 139* 119  PROT 6.6 6.7 7.5 6.7  ALBUMIN 3.1* 4.1 3.6 3.4*    TUMOR MARKERS: No results for input(s): AFPTM, CEA, CA199, CHROMGRNA in the last 8760 hours.   Cancer Staging  No matching staging information was found for the patient.  Assessment and Plan:  89 y/o F comorbid w PMHx significant for newly Dx metastatic pancreatic CA w severe cancer-related pain, despite multimodality therapy. Her oncologist recommended, and the Pt / Pt's POA (NOK) is interested in, Celiac Plexus Block.   The procedure has been fully reviewed with the patient/patient's authorized representative. The risks, benefits and alternatives have been explained, and the patient/patient's authorized representative has consented to the procedure.   *CTs performed and reviewed. No additional imaging required. *to be performed at [facility] with CT-guidance, under moderate sedation. *Pt in significant discomfort and requests soonest available. *Proceed to schedule ASAP based on mutual availability.  Thank you for this interesting consult.  I greatly enjoyed meeting Mary [name] and look forward to participating in their care.  A copy of this report was sent to the requesting provider on this date.  Electronically Signed:  Thom Hall, MD [facility]   Pager. [phone] Clinic. [phone]  I spent a total of  30 Minutes  in face to face in clinical consultation, greater than 50% of which was counseling/coordinating care for [name] severe cancer-related pain and celiac nerve block evaluation.

## 2023-11-13 DIAGNOSIS — Z853 Personal history of malignant neoplasm of breast: Secondary | ICD-10-CM | POA: Diagnosis not present

## 2023-11-13 DIAGNOSIS — C259 Malignant neoplasm of pancreas, unspecified: Secondary | ICD-10-CM | POA: Diagnosis not present

## 2023-11-13 DIAGNOSIS — E782 Mixed hyperlipidemia: Secondary | ICD-10-CM | POA: Diagnosis not present

## 2023-11-13 DIAGNOSIS — C787 Secondary malignant neoplasm of liver and intrahepatic bile duct: Secondary | ICD-10-CM | POA: Diagnosis not present

## 2023-11-13 DIAGNOSIS — I10 Essential (primary) hypertension: Secondary | ICD-10-CM | POA: Diagnosis not present

## 2023-11-13 DIAGNOSIS — I5022 Chronic systolic (congestive) heart failure: Secondary | ICD-10-CM | POA: Diagnosis not present

## 2023-11-14 DIAGNOSIS — C259 Malignant neoplasm of pancreas, unspecified: Secondary | ICD-10-CM | POA: Diagnosis not present

## 2023-11-14 DIAGNOSIS — C787 Secondary malignant neoplasm of liver and intrahepatic bile duct: Secondary | ICD-10-CM | POA: Diagnosis not present

## 2023-11-14 DIAGNOSIS — I10 Essential (primary) hypertension: Secondary | ICD-10-CM | POA: Diagnosis not present

## 2023-11-14 DIAGNOSIS — I5022 Chronic systolic (congestive) heart failure: Secondary | ICD-10-CM | POA: Diagnosis not present

## 2023-11-14 DIAGNOSIS — Z853 Personal history of malignant neoplasm of breast: Secondary | ICD-10-CM | POA: Diagnosis not present

## 2023-11-14 DIAGNOSIS — E782 Mixed hyperlipidemia: Secondary | ICD-10-CM | POA: Diagnosis not present

## 2023-11-15 DIAGNOSIS — E782 Mixed hyperlipidemia: Secondary | ICD-10-CM | POA: Diagnosis not present

## 2023-11-15 DIAGNOSIS — C259 Malignant neoplasm of pancreas, unspecified: Secondary | ICD-10-CM | POA: Diagnosis not present

## 2023-11-15 DIAGNOSIS — I10 Essential (primary) hypertension: Secondary | ICD-10-CM | POA: Diagnosis not present

## 2023-11-15 DIAGNOSIS — C787 Secondary malignant neoplasm of liver and intrahepatic bile duct: Secondary | ICD-10-CM | POA: Diagnosis not present

## 2023-11-15 DIAGNOSIS — I5022 Chronic systolic (congestive) heart failure: Secondary | ICD-10-CM | POA: Diagnosis not present

## 2023-11-15 DIAGNOSIS — Z853 Personal history of malignant neoplasm of breast: Secondary | ICD-10-CM | POA: Diagnosis not present

## 2023-11-16 DIAGNOSIS — C259 Malignant neoplasm of pancreas, unspecified: Secondary | ICD-10-CM | POA: Diagnosis not present

## 2023-11-16 DIAGNOSIS — E782 Mixed hyperlipidemia: Secondary | ICD-10-CM | POA: Diagnosis not present

## 2023-11-16 DIAGNOSIS — I5022 Chronic systolic (congestive) heart failure: Secondary | ICD-10-CM | POA: Diagnosis not present

## 2023-11-16 DIAGNOSIS — I10 Essential (primary) hypertension: Secondary | ICD-10-CM | POA: Diagnosis not present

## 2023-11-16 DIAGNOSIS — Z853 Personal history of malignant neoplasm of breast: Secondary | ICD-10-CM | POA: Diagnosis not present

## 2023-11-16 DIAGNOSIS — C787 Secondary malignant neoplasm of liver and intrahepatic bile duct: Secondary | ICD-10-CM | POA: Diagnosis not present

## 2023-11-17 DIAGNOSIS — I5022 Chronic systolic (congestive) heart failure: Secondary | ICD-10-CM | POA: Diagnosis not present

## 2023-11-17 DIAGNOSIS — I10 Essential (primary) hypertension: Secondary | ICD-10-CM | POA: Diagnosis not present

## 2023-11-17 DIAGNOSIS — C787 Secondary malignant neoplasm of liver and intrahepatic bile duct: Secondary | ICD-10-CM | POA: Diagnosis not present

## 2023-11-17 DIAGNOSIS — Z853 Personal history of malignant neoplasm of breast: Secondary | ICD-10-CM | POA: Diagnosis not present

## 2023-11-17 DIAGNOSIS — C259 Malignant neoplasm of pancreas, unspecified: Secondary | ICD-10-CM | POA: Diagnosis not present

## 2023-11-17 DIAGNOSIS — E782 Mixed hyperlipidemia: Secondary | ICD-10-CM | POA: Diagnosis not present

## 2023-11-18 DIAGNOSIS — I10 Essential (primary) hypertension: Secondary | ICD-10-CM | POA: Diagnosis not present

## 2023-11-18 DIAGNOSIS — C787 Secondary malignant neoplasm of liver and intrahepatic bile duct: Secondary | ICD-10-CM | POA: Diagnosis not present

## 2023-11-18 DIAGNOSIS — E782 Mixed hyperlipidemia: Secondary | ICD-10-CM | POA: Diagnosis not present

## 2023-11-18 DIAGNOSIS — I5022 Chronic systolic (congestive) heart failure: Secondary | ICD-10-CM | POA: Diagnosis not present

## 2023-11-18 DIAGNOSIS — Z853 Personal history of malignant neoplasm of breast: Secondary | ICD-10-CM | POA: Diagnosis not present

## 2023-11-18 DIAGNOSIS — C259 Malignant neoplasm of pancreas, unspecified: Secondary | ICD-10-CM | POA: Diagnosis not present

## 2023-11-19 DIAGNOSIS — E782 Mixed hyperlipidemia: Secondary | ICD-10-CM | POA: Diagnosis not present

## 2023-11-19 DIAGNOSIS — M810 Age-related osteoporosis without current pathological fracture: Secondary | ICD-10-CM | POA: Diagnosis not present

## 2023-11-19 DIAGNOSIS — I10 Essential (primary) hypertension: Secondary | ICD-10-CM | POA: Diagnosis not present

## 2023-11-19 DIAGNOSIS — G2581 Restless legs syndrome: Secondary | ICD-10-CM | POA: Diagnosis not present

## 2023-11-19 DIAGNOSIS — I5022 Chronic systolic (congestive) heart failure: Secondary | ICD-10-CM | POA: Diagnosis not present

## 2023-11-19 DIAGNOSIS — N319 Neuromuscular dysfunction of bladder, unspecified: Secondary | ICD-10-CM | POA: Diagnosis not present

## 2023-11-19 DIAGNOSIS — C259 Malignant neoplasm of pancreas, unspecified: Secondary | ICD-10-CM | POA: Diagnosis not present

## 2023-11-19 DIAGNOSIS — Z853 Personal history of malignant neoplasm of breast: Secondary | ICD-10-CM | POA: Diagnosis not present

## 2023-11-19 DIAGNOSIS — J302 Other seasonal allergic rhinitis: Secondary | ICD-10-CM | POA: Diagnosis not present

## 2023-11-19 DIAGNOSIS — C787 Secondary malignant neoplasm of liver and intrahepatic bile duct: Secondary | ICD-10-CM | POA: Diagnosis not present

## 2023-11-20 DIAGNOSIS — Z853 Personal history of malignant neoplasm of breast: Secondary | ICD-10-CM | POA: Diagnosis not present

## 2023-11-20 DIAGNOSIS — C259 Malignant neoplasm of pancreas, unspecified: Secondary | ICD-10-CM | POA: Diagnosis not present

## 2023-11-20 DIAGNOSIS — E782 Mixed hyperlipidemia: Secondary | ICD-10-CM | POA: Diagnosis not present

## 2023-11-20 DIAGNOSIS — I5022 Chronic systolic (congestive) heart failure: Secondary | ICD-10-CM | POA: Diagnosis not present

## 2023-11-20 DIAGNOSIS — I10 Essential (primary) hypertension: Secondary | ICD-10-CM | POA: Diagnosis not present

## 2023-11-20 DIAGNOSIS — C787 Secondary malignant neoplasm of liver and intrahepatic bile duct: Secondary | ICD-10-CM | POA: Diagnosis not present

## 2023-11-21 DIAGNOSIS — C259 Malignant neoplasm of pancreas, unspecified: Secondary | ICD-10-CM | POA: Diagnosis not present

## 2023-11-21 DIAGNOSIS — E782 Mixed hyperlipidemia: Secondary | ICD-10-CM | POA: Diagnosis not present

## 2023-11-21 DIAGNOSIS — C787 Secondary malignant neoplasm of liver and intrahepatic bile duct: Secondary | ICD-10-CM | POA: Diagnosis not present

## 2023-11-21 DIAGNOSIS — I5022 Chronic systolic (congestive) heart failure: Secondary | ICD-10-CM | POA: Diagnosis not present

## 2023-11-21 DIAGNOSIS — Z853 Personal history of malignant neoplasm of breast: Secondary | ICD-10-CM | POA: Diagnosis not present

## 2023-11-21 DIAGNOSIS — I10 Essential (primary) hypertension: Secondary | ICD-10-CM | POA: Diagnosis not present

## 2023-11-22 DIAGNOSIS — C787 Secondary malignant neoplasm of liver and intrahepatic bile duct: Secondary | ICD-10-CM | POA: Diagnosis not present

## 2023-11-22 DIAGNOSIS — C259 Malignant neoplasm of pancreas, unspecified: Secondary | ICD-10-CM | POA: Diagnosis not present

## 2023-11-22 DIAGNOSIS — E782 Mixed hyperlipidemia: Secondary | ICD-10-CM | POA: Diagnosis not present

## 2023-11-22 DIAGNOSIS — Z853 Personal history of malignant neoplasm of breast: Secondary | ICD-10-CM | POA: Diagnosis not present

## 2023-11-22 DIAGNOSIS — I10 Essential (primary) hypertension: Secondary | ICD-10-CM | POA: Diagnosis not present

## 2023-11-22 DIAGNOSIS — I5022 Chronic systolic (congestive) heart failure: Secondary | ICD-10-CM | POA: Diagnosis not present

## 2023-11-23 DIAGNOSIS — Z853 Personal history of malignant neoplasm of breast: Secondary | ICD-10-CM | POA: Diagnosis not present

## 2023-11-23 DIAGNOSIS — I5022 Chronic systolic (congestive) heart failure: Secondary | ICD-10-CM | POA: Diagnosis not present

## 2023-11-23 DIAGNOSIS — C787 Secondary malignant neoplasm of liver and intrahepatic bile duct: Secondary | ICD-10-CM | POA: Diagnosis not present

## 2023-11-23 DIAGNOSIS — I10 Essential (primary) hypertension: Secondary | ICD-10-CM | POA: Diagnosis not present

## 2023-11-23 DIAGNOSIS — C259 Malignant neoplasm of pancreas, unspecified: Secondary | ICD-10-CM | POA: Diagnosis not present

## 2023-11-23 DIAGNOSIS — E782 Mixed hyperlipidemia: Secondary | ICD-10-CM | POA: Diagnosis not present

## 2023-11-24 DIAGNOSIS — I10 Essential (primary) hypertension: Secondary | ICD-10-CM | POA: Diagnosis not present

## 2023-11-24 DIAGNOSIS — C787 Secondary malignant neoplasm of liver and intrahepatic bile duct: Secondary | ICD-10-CM | POA: Diagnosis not present

## 2023-11-24 DIAGNOSIS — I5022 Chronic systolic (congestive) heart failure: Secondary | ICD-10-CM | POA: Diagnosis not present

## 2023-11-24 DIAGNOSIS — Z853 Personal history of malignant neoplasm of breast: Secondary | ICD-10-CM | POA: Diagnosis not present

## 2023-11-24 DIAGNOSIS — C259 Malignant neoplasm of pancreas, unspecified: Secondary | ICD-10-CM | POA: Diagnosis not present

## 2023-11-24 DIAGNOSIS — E782 Mixed hyperlipidemia: Secondary | ICD-10-CM | POA: Diagnosis not present

## 2023-11-25 DIAGNOSIS — Z853 Personal history of malignant neoplasm of breast: Secondary | ICD-10-CM | POA: Diagnosis not present

## 2023-11-25 DIAGNOSIS — C259 Malignant neoplasm of pancreas, unspecified: Secondary | ICD-10-CM | POA: Diagnosis not present

## 2023-11-25 DIAGNOSIS — I10 Essential (primary) hypertension: Secondary | ICD-10-CM | POA: Diagnosis not present

## 2023-11-25 DIAGNOSIS — I5022 Chronic systolic (congestive) heart failure: Secondary | ICD-10-CM | POA: Diagnosis not present

## 2023-11-25 DIAGNOSIS — C787 Secondary malignant neoplasm of liver and intrahepatic bile duct: Secondary | ICD-10-CM | POA: Diagnosis not present

## 2023-11-25 DIAGNOSIS — E782 Mixed hyperlipidemia: Secondary | ICD-10-CM | POA: Diagnosis not present

## 2023-12-03 ENCOUNTER — Ambulatory Visit (HOSPITAL_COMMUNITY)

## 2023-12-04 ENCOUNTER — Ambulatory Visit (HOSPITAL_COMMUNITY)

## 2023-12-19 DEATH — deceased
